# Patient Record
Sex: Female | Born: 1950
Health system: Southern US, Community
[De-identification: ages and names within clinical notes are randomized; demographics above are authoritative.]

## PROBLEM LIST (undated history)

## (undated) DIAGNOSIS — E785 Hyperlipidemia, unspecified: Secondary | ICD-10-CM

## (undated) DIAGNOSIS — I10 Essential (primary) hypertension: Secondary | ICD-10-CM

## (undated) DIAGNOSIS — F1910 Other psychoactive substance abuse, uncomplicated: Secondary | ICD-10-CM

## (undated) DIAGNOSIS — Z720 Tobacco use: Secondary | ICD-10-CM

## (undated) DIAGNOSIS — I739 Peripheral vascular disease, unspecified: Secondary | ICD-10-CM

## (undated) DIAGNOSIS — I639 Cerebral infarction, unspecified: Secondary | ICD-10-CM

## (undated) DIAGNOSIS — D689 Coagulation defect, unspecified: Secondary | ICD-10-CM

## (undated) HISTORY — DX: Essential (primary) hypertension: I10

## (undated) HISTORY — PX: VEIN SURGERY: SHX48

## (undated) HISTORY — DX: Peripheral vascular disease, unspecified: I73.9

## (undated) HISTORY — DX: Coagulation defect, unspecified: D68.9

## (undated) HISTORY — DX: Tobacco use: Z72.0

## (undated) HISTORY — DX: Other psychoactive substance abuse, uncomplicated: F19.10

## (undated) HISTORY — DX: Hyperlipidemia, unspecified: E78.5

## (undated) HISTORY — DX: Cerebral infarction, unspecified: I63.9

---

## 2000-09-19 DIAGNOSIS — I639 Cerebral infarction, unspecified: Secondary | ICD-10-CM

## 2000-09-19 HISTORY — DX: Cerebral infarction, unspecified: I63.9

## 2004-09-19 LAB — HM MAMMOGRAPHY: HM MAMMO: NORMAL (ref 0–4)

## 2004-09-21 ENCOUNTER — Inpatient Hospital Stay (HOSPITAL_COMMUNITY): Admission: EM | Admit: 2004-09-21 | Discharge: 2004-09-27 | Payer: Self-pay | Admitting: Emergency Medicine

## 2004-09-21 ENCOUNTER — Ambulatory Visit: Payer: Self-pay | Admitting: Cardiology

## 2004-09-22 ENCOUNTER — Encounter: Payer: Self-pay | Admitting: Cardiology

## 2004-11-05 ENCOUNTER — Ambulatory Visit: Payer: Self-pay | Admitting: Internal Medicine

## 2004-12-03 ENCOUNTER — Ambulatory Visit: Payer: Self-pay | Admitting: Internal Medicine

## 2005-02-08 ENCOUNTER — Ambulatory Visit: Payer: Self-pay | Admitting: Internal Medicine

## 2005-04-07 ENCOUNTER — Ambulatory Visit: Payer: Self-pay | Admitting: Internal Medicine

## 2007-02-17 ENCOUNTER — Inpatient Hospital Stay (HOSPITAL_COMMUNITY): Admission: EM | Admit: 2007-02-17 | Discharge: 2007-02-20 | Payer: Self-pay | Admitting: Emergency Medicine

## 2007-02-19 ENCOUNTER — Encounter (INDEPENDENT_AMBULATORY_CARE_PROVIDER_SITE_OTHER): Payer: Self-pay | Admitting: Neurology

## 2007-02-19 ENCOUNTER — Ambulatory Visit: Payer: Self-pay | Admitting: Physical Medicine & Rehabilitation

## 2007-02-20 ENCOUNTER — Ambulatory Visit: Payer: Self-pay | Admitting: Physical Medicine & Rehabilitation

## 2007-02-20 ENCOUNTER — Inpatient Hospital Stay (HOSPITAL_COMMUNITY)
Admission: RE | Admit: 2007-02-20 | Discharge: 2007-03-09 | Payer: Self-pay | Admitting: Physical Medicine & Rehabilitation

## 2007-03-15 ENCOUNTER — Ambulatory Visit: Payer: Self-pay | Admitting: Family Medicine

## 2007-03-15 ENCOUNTER — Ambulatory Visit: Payer: Self-pay | Admitting: *Deleted

## 2007-05-14 ENCOUNTER — Ambulatory Visit: Payer: Self-pay | Admitting: Physical Medicine & Rehabilitation

## 2007-05-14 ENCOUNTER — Encounter
Admission: RE | Admit: 2007-05-14 | Discharge: 2007-06-19 | Payer: Self-pay | Admitting: Physical Medicine & Rehabilitation

## 2007-05-18 ENCOUNTER — Ambulatory Visit: Payer: Self-pay | Admitting: Family Medicine

## 2007-05-18 LAB — CONVERTED CEMR LAB
ALT: 33 U/L
AST: 23 U/L
Albumin: 4.6 g/dL
Alkaline Phosphatase: 133 U/L — ABNORMAL HIGH
BUN: 18 mg/dL
Basophils Absolute: 0 K/uL
Basophils Relative: 0 %
CO2: 23 meq/L
Calcium: 9.9 mg/dL
Chloride: 104 meq/L
Cholesterol: 255 mg/dL — ABNORMAL HIGH
Creatinine, Ser: 1.19 mg/dL
Eosinophils Absolute: 0.1 K/uL
Eosinophils Relative: 2 %
Glucose, Bld: 95 mg/dL
HCT: 43.3 %
HDL: 44 mg/dL
Hemoglobin: 13.6 g/dL
LDL Cholesterol: 176 mg/dL — ABNORMAL HIGH
Lymphocytes Relative: 37 %
Lymphs Abs: 2.4 K/uL
MCHC: 31.4 g/dL
MCV: 93.5 fL
Monocytes Absolute: 0.5 K/uL
Monocytes Relative: 8 %
Neutro Abs: 3.4 K/uL
Neutrophils Relative %: 53 %
Platelets: 299 K/uL
Potassium: 4.1 meq/L
RBC: 4.63 M/uL
RDW: 16.2 % — ABNORMAL HIGH
Sodium: 141 meq/L
TSH: 0.5 u[IU]/mL
Total Bilirubin: 0.6 mg/dL
Total CHOL/HDL Ratio: 5.8
Total Protein: 8.1 g/dL
Triglycerides: 177 mg/dL — ABNORMAL HIGH
VLDL: 35 mg/dL
WBC: 6.4 10*3/microliter

## 2007-05-28 ENCOUNTER — Encounter
Admission: RE | Admit: 2007-05-28 | Discharge: 2007-08-26 | Payer: Self-pay | Admitting: Physical Medicine & Rehabilitation

## 2007-07-18 ENCOUNTER — Encounter: Admission: RE | Admit: 2007-07-18 | Discharge: 2007-09-19 | Payer: Self-pay | Admitting: Internal Medicine

## 2007-07-19 ENCOUNTER — Ambulatory Visit: Payer: Self-pay | Admitting: Internal Medicine

## 2007-07-19 ENCOUNTER — Encounter (INDEPENDENT_AMBULATORY_CARE_PROVIDER_SITE_OTHER): Payer: Self-pay | Admitting: Family Medicine

## 2007-07-19 LAB — CONVERTED CEMR LAB
ALT: 23 units/L (ref 0–35)
Cholesterol: 157 mg/dL (ref 0–200)
LDL Cholesterol: 66 mg/dL (ref 0–99)
VLDL: 44 mg/dL — ABNORMAL HIGH (ref 0–40)

## 2007-08-06 ENCOUNTER — Ambulatory Visit: Payer: Self-pay | Admitting: Physical Medicine & Rehabilitation

## 2007-08-06 ENCOUNTER — Encounter
Admission: RE | Admit: 2007-08-06 | Discharge: 2007-09-19 | Payer: Self-pay | Admitting: Physical Medicine & Rehabilitation

## 2007-09-05 ENCOUNTER — Encounter
Admission: RE | Admit: 2007-09-05 | Discharge: 2007-09-19 | Payer: Self-pay | Admitting: Physical Medicine & Rehabilitation

## 2007-09-25 ENCOUNTER — Encounter
Admission: RE | Admit: 2007-09-25 | Discharge: 2007-10-11 | Payer: Self-pay | Admitting: Physical Medicine & Rehabilitation

## 2007-10-29 ENCOUNTER — Encounter
Admission: RE | Admit: 2007-10-29 | Discharge: 2008-01-27 | Payer: Self-pay | Admitting: Physical Medicine & Rehabilitation

## 2007-12-12 ENCOUNTER — Ambulatory Visit: Payer: Self-pay | Admitting: Physical Medicine & Rehabilitation

## 2007-12-19 ENCOUNTER — Ambulatory Visit: Payer: Self-pay | Admitting: Physical Medicine & Rehabilitation

## 2007-12-25 ENCOUNTER — Ambulatory Visit (HOSPITAL_COMMUNITY)
Admission: RE | Admit: 2007-12-25 | Discharge: 2007-12-25 | Payer: Self-pay | Admitting: Physical Medicine & Rehabilitation

## 2007-12-26 ENCOUNTER — Ambulatory Visit: Payer: Self-pay | Admitting: Physical Medicine & Rehabilitation

## 2008-01-09 ENCOUNTER — Ambulatory Visit: Payer: Self-pay | Admitting: Physical Medicine & Rehabilitation

## 2008-03-06 ENCOUNTER — Encounter
Admission: RE | Admit: 2008-03-06 | Discharge: 2008-06-04 | Payer: Self-pay | Admitting: Physical Medicine & Rehabilitation

## 2008-03-10 ENCOUNTER — Ambulatory Visit: Payer: Self-pay | Admitting: Physical Medicine & Rehabilitation

## 2008-03-28 ENCOUNTER — Encounter (HOSPITAL_BASED_OUTPATIENT_CLINIC_OR_DEPARTMENT_OTHER): Admission: RE | Admit: 2008-03-28 | Discharge: 2008-06-12 | Payer: Self-pay | Admitting: Internal Medicine

## 2008-04-07 ENCOUNTER — Ambulatory Visit: Payer: Self-pay | Admitting: Surgery

## 2008-04-09 ENCOUNTER — Ambulatory Visit: Payer: Self-pay | Admitting: Physical Medicine & Rehabilitation

## 2008-04-29 ENCOUNTER — Ambulatory Visit: Payer: Self-pay | Admitting: Surgery

## 2008-04-29 ENCOUNTER — Ambulatory Visit (HOSPITAL_COMMUNITY): Admission: RE | Admit: 2008-04-29 | Discharge: 2008-04-29 | Payer: Self-pay | Admitting: Surgery

## 2008-05-19 ENCOUNTER — Ambulatory Visit: Payer: Self-pay | Admitting: Surgery

## 2008-05-30 ENCOUNTER — Inpatient Hospital Stay (HOSPITAL_COMMUNITY): Admission: RE | Admit: 2008-05-30 | Discharge: 2008-06-09 | Payer: Self-pay | Admitting: Surgery

## 2008-05-30 ENCOUNTER — Ambulatory Visit: Payer: Self-pay | Admitting: Surgery

## 2008-05-31 ENCOUNTER — Encounter: Payer: Self-pay | Admitting: Surgery

## 2008-06-21 ENCOUNTER — Ambulatory Visit: Payer: Self-pay | Admitting: Cardiology

## 2008-06-22 ENCOUNTER — Inpatient Hospital Stay (HOSPITAL_COMMUNITY): Admission: EM | Admit: 2008-06-22 | Discharge: 2008-06-25 | Payer: Self-pay | Admitting: Emergency Medicine

## 2008-06-25 ENCOUNTER — Encounter (INDEPENDENT_AMBULATORY_CARE_PROVIDER_SITE_OTHER): Payer: Self-pay | Admitting: *Deleted

## 2008-07-07 ENCOUNTER — Ambulatory Visit: Payer: Self-pay | Admitting: Surgery

## 2008-07-08 ENCOUNTER — Encounter
Admission: RE | Admit: 2008-07-08 | Discharge: 2008-10-06 | Payer: Self-pay | Admitting: Physical Medicine & Rehabilitation

## 2008-07-09 ENCOUNTER — Ambulatory Visit: Payer: Self-pay | Admitting: Physical Medicine & Rehabilitation

## 2008-07-14 ENCOUNTER — Encounter
Admission: RE | Admit: 2008-07-14 | Discharge: 2008-09-18 | Payer: Self-pay | Admitting: Physical Medicine & Rehabilitation

## 2008-09-05 ENCOUNTER — Ambulatory Visit: Payer: Self-pay | Admitting: Physical Medicine & Rehabilitation

## 2008-10-13 ENCOUNTER — Ambulatory Visit: Payer: Self-pay | Admitting: Surgery

## 2009-09-19 LAB — HM PAP SMEAR: HM PAP: NORMAL

## 2010-05-25 ENCOUNTER — Ambulatory Visit: Payer: Self-pay | Admitting: Cardiovascular Disease

## 2010-10-09 ENCOUNTER — Encounter: Payer: Self-pay | Admitting: Neurology

## 2010-10-10 ENCOUNTER — Encounter: Payer: Self-pay | Admitting: Internal Medicine

## 2010-11-30 ENCOUNTER — Ambulatory Visit (INDEPENDENT_AMBULATORY_CARE_PROVIDER_SITE_OTHER): Payer: Medicare Other | Admitting: Cardiovascular Disease

## 2010-11-30 DIAGNOSIS — I119 Hypertensive heart disease without heart failure: Secondary | ICD-10-CM

## 2010-12-21 ENCOUNTER — Other Ambulatory Visit: Payer: Self-pay | Admitting: Cardiovascular Disease

## 2011-02-01 NOTE — Discharge Summary (Signed)
Wendy Townsend, Wendy Townsend            ACCOUNT NO.:  [id number]   MEDICAL RECORD NO.:  [id number]          PATIENT TYPE:  INP   LOCATION:  5118                         FACILITY:  [facility]   PHYSICIAN:  Reyne Dumas, MD       DATE OF BIRTH:  11-05-1950   DATE OF ADMISSION:  06/21/2008  DATE OF DISCHARGE:  06/25/2008                               DISCHARGE SUMMARY   DISCHARGE DIAGNOSES:  1. Urinary tract infection with Trichomonas.  2. Nausea, vomiting secondary to viral gastroenteritis.  3. Nicotine dependence.  4. Hyperlipidemia.  5. Hypertension.   SUBJECTIVE:  This is a 60 year old female who presents to the ER with a  chief complaint of abdominal pain, nausea, vomiting, which started 4-5  days prior to her presentation.  She was found to be dehydrated in the  ER with elevated creatinine.  Patient denied any sick contacts and was  afebrile.  No obvious cause of the patient's abdominal pain and nausea  were found; therefore, it was thought to be secondary to viral  gastroenteritis.  She had an abdominal KUB done that showed no evidence  of any acute abnormality.  A portable chest x-ray showed no evidence of  acute cardiopulmonary syndrome.  Her blood work was consistent with  mildly elevated white count, hypokalemia, renal insufficiency with a  creatinine of 1.91.  Mildly elevated total bilirubin of 1.3.  The urine  was positive for a large amount of leukocyte esterase and subsequently  grew Trichomonas.  Patient was treated with IV Flagyl.  Patient's  abdominal pain could also be secondary to her urinary tract infection.  Her symptoms continued to improve.  Her oral intake was advanced.  She  denied any nausea, vomiting, or abdominal pain on the day of discharge.  She was found to be hemodynamically stable.   EKG changes with ST depression noted on the inferolateral leads.  Patient did not have any evidence of acute coronary syndrome.  She was  not anticoagulated due to concerns about  diffuse gastritis and Mallory-  Weiss tear.  Serial EKGs remained stable.   Patient had a cardiac echo, the results of which are still pending.   Patient is being ambulated prior to discharge.  If she is deemed to be  stable, then she will go home.   DISCHARGE MEDICATIONS:  1. Aspirin 325 mg p.o. daily.  2. Clonidine 0.1 mg 3 times a day.  3. Hydrochlorothiazide 25 mg once daily.  4. Simvastatin 40 mg p.o. daily.  5. Oxycodone and Tylenol q.4h. as needed.  6. Flagyl 500 mg p.o. q.8h. x7 days.  7. Zofran 4 mg p.o. q.6h. p.r.n. nausea.   DIET:  Cardiac diet with plenty of fluids.   Hold medications, lisinopril, until seen by the PCP.   Follow up with PCP in 5-7 days.      Reyne Dumas, MD  Electronically Signed     NA/MEDQ  D:  06/25/2008  T:  06/25/2008  Job:  [id number]

## 2011-02-01 NOTE — Assessment & Plan Note (Signed)
[facility]   NAME:  Wendy Townsend, Wendy Townsend            ACCOUNT NO.:  [id number]   MEDICAL RECORD NO.:  [id number]      DATE OF BIRTH:  08/30/1951   PHYSICIAN:  Ricard Dillon, M.D.      VISIT DATE:                                   OFFICE VISIT   Ms. Llera is a lady we have been following for a largely ischemic  wound on her left medial ankle.  She has been applying Santyl and  wrapping with a light Kerlix wrap.  She has been followed also by  Vascular Surgery, Dr. Trula Slade and she is status post arteriogram.  Per  the patient, I think she is scheduled now for a FEM-POP bypass on the  left on May 30, 2008.   PHYSICAL EXAMINATION:  Temperature is 98.1.  She looks well.  The wound  dimensions are 4 x 4.1 x 0.9, which is probably unchanged.  However, I  think in this setting, lack of change is actually quite good.  In terms  of the wound bed however, there appears to be healthy granulation with  the rim of epithelialization.  There was nothing that needed debridement  today.  I do not think she requires Santyl any further.  She is having  moderate drainage and some pain; however, the wound actually looks clean  and there is no evidence of surrounding infection.   IMPRESSION:  Ischemic wound, left medial ankle.  I think she is  scheduled for a femoral-popliteal bypass.  I thought we could change the  Silvercel to help with the drainage and reduce any bioburden that might  be present.  Miraculously, the wound continues to be stable to improve.  I think with revascularization, that has a good chance of healing.  I  will see her back one further time next week and then after her  operation.           ______________________________  Ricard Dillon, M.D.     MGR/MEDQ  D:  05/22/2008  T:  05/22/2008  Job:  [id number]

## 2011-02-01 NOTE — Procedures (Signed)
BYPASS GRAFT EVALUATION   INDICATION:  Postoperative evaluation of bypass graft.   HISTORY:  Diabetes:  No.  Cardiac:  No.  Hypertension:  Yes.  Smoking:  Yes.  Previous Surgery:  Left common femoral to popliteal artery bypass on  05/30/2008 by Dr. Trula Slade.   SINGLE LEVEL ARTERIAL EXAM                               RIGHT              LEFT  Brachial:                    109                106  Anterior tibial:             56                 107  Posterior tibial:            60                 88  Peroneal:  Ankle/brachial index:        0.55               0.98   PREVIOUS ABI:  Date: 07/07/08  RIGHT:  0.56  LEFT:  0.90   LOWER EXTREMITY BYPASS GRAFT DUPLEX EXAM:   DUPLEX:  1. Increased velocity of 225 cm/s noted in the left common femoral      artery.  2. Patent left fem-pop bypass graft with no evidence of stenosis.  3. Biphasic duplex waveform noted from inflow artery, within the      graft, and outflow artery.   IMPRESSION:  1. Right ankle brachial index suggests moderate arterial disease with      monophasic Doppler waveforms.  2. Normal left ankle brachial index with biphasic Doppler waveforms.   ___________________________________________  V. [name], MD   AC/MEDQ  D:  10/13/2008  T:  10/13/2008  Job:  [id number]

## 2011-02-01 NOTE — Op Note (Signed)
NAME:  Wendy Townsend, Wendy Townsend            ACCOUNT NO.:  [id number]   MEDICAL RECORD NO.:  [id number]          PATIENT TYPE:  INP   LOCATION:  [id number]                         FACILITY:  [facility]   PHYSICIAN:  Theotis Burrow IV, MDDATE OF BIRTH:  24-Apr-1951   DATE OF PROCEDURE:  05/30/2008  DATE OF DISCHARGE:                               OPERATIVE REPORT   PREOPERATIVE DIAGNOSIS:  Left leg ulcer.   POSTOPERATIVE DIAGNOSIS:  Left leg ulcer.   PROCEDURES PERFORMED:  1. Left common femoral to below-knee popliteal artery bypass graft      with 8-mm ringed probe patent polytetrafluorethylene.  2. Left lower extremity angiogram.   SURGEON:  1. [name], MD   ASSISTANT:  Jacinta Shoe, PA.   ANESTHESIA:  General.   BLOOD LOSS:  150 mL.   COMPLICATIONS:  None.   DRAINS:  None.   CULTURES:  None.   SPECIMENS:  None.   INDICATIONS:  This is a 60 year old female with ulcer on her left foot,  which has been present for approximately 6 months.  I recently saw her  for the first time 2-3 weeks ago.  She has undergone arteriogram, which  reveals a single-vessel runoff.  They elected to proceed with bypass  graft.  She has been vein mapped in her upper and lower extremity and  does not have adequate caliber vein.   PROCEDURE:  The patient was identified in the holding area and taken to  the room where she was placed supine on table.  General endotracheal  anesthesia was administered.  The patient was prepped and draped in  standard sterile fashion.  Time-out was called.  Antibiotics were given.  First a longitudinal incision was made in the groin.  Cautery was used  to dissect the subcutaneous tissue.  The femoral sheath was identified  and opened sharply.  The patient had an occluded proximal superficial  femoral artery.  Her profunda takeoff was just below the level of the  inguinal ligament.  The distal external iliac artery was mobilized under  the inguinal ligament for  proximal control.  The profunda femoral artery  was encircled with vessel loop.  Next, below-knee incision was made two  fingerbreadths below the posterior to tibial edge below the knee.  Cautery was used to dissect the subcutaneous tissue.  Crural fascia was  opened sharply.  Gastrocnemius muscle was then identified and reflected  posteriorly.  The soleus muscle was taken down to the posterior edge of  the tibia for short distance.  Popliteal space was entered.  The  popliteal artery was mobilized down to the anterior tibial branch.  Next, using a long straight tunneler, a tunnel was created deep to the  sartorius muscle.  Once the tunnel was complete, the patient was given  systemic heparinization.  A Henley clamp was used to occlude the distal  external iliac artery.  The vessel loop was tightened on the profunda  femoral artery.  A #11 blade was used to make an arteriotomy.  This was  extended with Potts scissors.  An 8-mm Gore-Tex probe patent ring graft  was  anastomosed to the distal common femoral artery.  This was an end-to-  side anastomosis with 5-0 Prolene.  Prior to completion, the arteries  were flushed appropriately.  The anastomosis was then secured.  Vascular  clamps were released.  There was excellent pulsatile flow through the  graft.  Next, the graft was brought through the previously created  tunnel.  A tourniquet was used for hemostasis for the below-knee  anastomosis.  A Webril was placed in the mid thigh.  Esmarch was used to  exsanguinate the leg.  The tourniquet was taken to 250 mmHg pressure.  Next, a longitudinal arteriotomy was made in the distal popliteal  artery.  This was extended with Potts scissors.  The leg was straight  and the graft was cut to the appropriate length.  The graft was then  beveled to fit the size of arteriotomy.  An end-to-side anastomosis was  created with 6-0 Prolene.  Prior to completion, the tourniquet was  released.  The artery and  the graft were appropriately flushed.  The  anastomosis was then secured.  At this point in time, we elected to  perform arteriogram.  A butterfly needle access was made into the  proximal graft, which was then occluded proximal to the access.  Arteriogram was then confirmed.  This revealed a single-vessel runoff as  documented on the preop arteriogram and a patent anastomosis.  At this  point in point, I gave the patient 25 mg protamine.  Wounds were  copiously irrigated.  The below-knee incision was closed with a 2-0  Vicryl on the fascia as well as the subcutaneous tissue.  The 4-0 Vicryl  was used to close the skin.  The femoral sheath was reapproximated with  2-0 Vicryl.  An additional layer of 2-0 Vicryl followed by 3-0 Vicryl  were used to close the tissue and the skin was closed with 4-0 Vicryl.  Dermabond was placed.  The patient was then successfully extubated and  taken to the recovery room in stable condition.           ______________________________  V. [name], MD  Electronically Signed     VWB/MEDQ  D:  05/30/2008  T:  05/31/2008  Job:  [id number]

## 2011-02-01 NOTE — Discharge Summary (Signed)
Wendy Townsend, Wendy Townsend            ACCOUNT NO.:  [id number]   MEDICAL RECORD NO.:  [id number]          PATIENT TYPE:  INP   LOCATION:  2010                         FACILITY:  [facility]   PHYSICIAN:  Theotis Burrow IV, MDDATE OF BIRTH:  26-Feb-1951   DATE OF ADMISSION:  05/30/2008  DATE OF DISCHARGE:  06/09/2008                               DISCHARGE SUMMARY   ADDENDUM   Addendum to the discharge summary, reference number is [id number].   [name] has had 2 episodes of cardiac decelerations and  approximately 2- to 2-1/2-second pauses.  She was to be discharged this  morning, June 09, 2008.  This occurred prior to the discharge at 5  yesterday afternoon and again approximately 8:30 this morning.  We did  contact Dr. Acie Fredrickson who performed a stress test on her prior to her  surgery.  He made the recommendation to decrease her clonidine to 0.1 mg  p.o. q.i.d. and initiate treatment with amlodipine 5 mg p.o. daily.  He  felt that this was okay to be followed up in the office.  We will have  her make an appointment with him within the next 2 weeks for a followup,  so this problem can be evaluated.  We will discharge her later on today.      Chad Cordial, PA      V. [name], MD  Electronically Signed    KEL/MEDQ  D:  06/09/2008  T:  06/10/2008  Job:  [id number]   cc:   Thayer Headings, M.D.

## 2011-02-01 NOTE — Assessment & Plan Note (Signed)
HISTORY:  Wendy Townsend is back regarding her left ankle wound.  She is doing  wet to dry dressings twice daily.  She had x-rays of her ankle done  yesterday, which were negative for osteomyelitis.  She has finished the  Keflex.  She is still having pain in the leg.  It is keeping her up at  night at 8/10.   REVIEW OF SYSTEMS:  Notable for the above.  A full review is in the  written health and history section in the chart.   PHYSICAL EXAMINATION:  VITAL SIGNS:  Blood pressure 143/78, pulse 92,  respirations 18, saturation 99% on room air.  GENERAL:  The patient is pleasant, alert and oriented x3.  NEUROLOGIC:  She is antalgic on the left leg.  She still has 1/2  sensation on that side and has some dysesthesias in the left arm and  leg.  EXTREMITIES:  The ankle wound is improving with increasing granulation  tissue and less fibronecrotic tissue.  She still has a rim of  fibronecrotic tissue on the anterior portion, essentially from 1 o'clock  to 5 o'clock.  No odor was seen today.  Depth of the wound appeared to  be 1/4 or less.  I measured the overall size as approximately 1.25 to  1.5 inches in diameter.   ASSESSMENT:  1. Right thalamic stroke.  2. Left ankle wound.   PLAN:  1. Continue twice daily dressing changes.  Apparently she will have      someone coming out to help, to check on this.  2. I gave her Vicodin for break-through pain and help her sleep.  3. I will see her back in two weeks for followup.      Meredith Staggers, M.D.  Electronically Signed     ZTS/MedQ  D:  12/26/2007 10:10:54  T:  12/26/2007 10:59:46  Job #:  [id number]

## 2011-02-01 NOTE — Assessment & Plan Note (Signed)
Wendy Townsend is back regarding her right lemic and internal capsule infarcts.  She is now on outpatient therapy.  She gets those through [facility].  She is  very pleased with her progress.  She is having no further pain.  She has  come off Neurontin.  Her blood pressure has been under good control.  She is walking with her walker primarily around the house. She is  beginning trial of ambulation with a cane in therapy.   REVIEW OF SYSTEMS:  Pertinent positives listed above.  Full review is in  the written health and history section in the chart.   SOCIAL HISTORY:  The patient continues to be widowed and live alone.   PHYSICAL EXAMINATION:  VITALS:  Blood pressure is 129/80, pulse is 61,  respiratory rate 16.  She is sating 100% on room air.  EXTREMITIES:  She has improved movement of her left arm and leg today  although they are still a bit apraxic.  Sensation is 1/2 particularly in  the leg.  She is not wearing her AFO but does clear the foot in swing  phase of gait.  Shoulders are negative for rotator cuff signs today.  NEURO:  Cognitively she is very appropriate with good mood.  She showed  good safety judgment today.  HEART:  Regular.  CHEST:  Clear.  ABDOMEN:  Soft, nontender.   ASSESSMENT:  1. Right lemic/internal capsule infarct with left sided weakness,      apraxia and hemicentra loss.  2. History of hypertension.  3. Neuropathic pain.   PLAN:  1. Continue Clonidine, Lisinopril and HCTZ1 for blood pressure      control.  2. Continue therapy to work on gait, stability.  I think she will be      needing her walker foreseeable future.  3. I will see her back in about 3-4 months time.  I asked her to call      me if she has any problems or questions.      Meredith Staggers, M.D.  Electronically Signed     ZTS/MedQ  D:  08/07/2007 12:58:52  T:  08/08/2007 07:41:53  Job #:  [id number]

## 2011-02-01 NOTE — Assessment & Plan Note (Signed)
OFFICE VISIT   MINORI, FLEES  DOB:  [birth year]                                       05/19/2008  CHART#:[id number]   This is a clinic note/history and physical for preop.   CHIEF COMPLAINT:  Left leg ulcer.   HISTORY:  This is a 60 year old female whom I began seeing at the  request of Dr. Ephriam Knuckles for evaluation of left leg ulcer.  The ulcer has  been present for approximately 6 months.  This is associated with pain.  She had been undergoing a chemical debridement with Santyl.  She does  not have any fevers.  She has a recent duplex which shows an ankle  brachial index of 0.38.  I took her for arteriogram and she was found to  have single vessel runoff via her anterior tibial artery with a  superficial femoral artery that was occluded and diseased above the  popliteal artery.  She comes in today for further discussion.   REVIEW OF SYSTEMS:  GENERAL:  Negative fevers or chills.  Negative  weight gain or weight loss.  CARDIAC:  Negative for chest pain.  PULMONARY:  Negative for shortness of breath, asthma, or wheezing.  GASTROINTESTINAL:  Negative.  GU:  Negative.  VASCULAR:  Please see HPI.  NEURO:  She has a history of stroke with left hand weakness which is  improving.  ORTHO:  Negative.  ENT:  Negative.  HEME:  Negative.  PSYCH:  Negative.   PAST MEDICAL HISTORY:  Right thalamic stroke in 2008 of May.  Hypertension, hyperlipidemia, tobacco abuse.   MEDICATIONS:  Clonidine 0.1 mg p.o. t.i.d., aspirin 325 mg daily,  lisinopril 40 mg per day, hydrochlorothiazide 25 mg per day, simvastatin  40 mg per day, Santyl.   ALLERGIES:  Penicillin.  She has tolerated cephalosporins.   PAST SURGICAL HISTORY:  Negative.   FAMILY HISTORY:  Negative for cardiovascular disease.   SOCIAL HISTORY:  She is widowed and disabled.  History of smoking but  quit a year ago.   PHYSICAL EXAMINATION:  Blood pressure is 125/86, pulse is 70.  General:  Well-appearing, no acute distress.  Cardiovascular:  Regular rate and  rhythm.  Pulmonary:  Lungs are clear bilaterally.  Abdomen:  Soft,  nontender.  Neuro:  Grossly intact.  Skin is without rash.  Extremities:  She has a large ulcer on the medial aspect of her left leg.  Pedal  pulses not palpable.   DIAGNOSTIC STUDIES:  Vein mapping was performed.  The patient does not  have vein in her lower extremity which is adequate for bypass.  Angiogram reveals single vessel runoff via her anterior tibial artery.   ASSESSMENT/PLAN:  Left leg ulcer.   PLAN:  Patient has been scheduled for a left femoral to below knee  popliteal artery bypass graft with graft.  This has been scheduled for  Friday, September 11.  Risks and benefits have been discussed with the  patient including bleeding, infection, and patency.  The patient is to  be seen by Dr. Ephriam Knuckles for cardiac evaluation prior to her surgery.   Eldridge Abrahams, MD  Electronically Signed   VWB/MEDQ  D:  05/19/2008  T:  05/20/2008  Job:  [id number]   cc:   Verneita Griffes

## 2011-02-01 NOTE — Consult Note (Signed)
Wendy Townsend, Wendy Townsend               ACCOUNT NO.:  [id number]   MEDICAL RECORD NO.:  [id number]          PATIENT TYPE:  EMS   LOCATION:  MAJO                         FACILITY:  [facility]   PHYSICIAN:  Jill Alexanders, M.D.  DATE OF BIRTH:  [birth year]   DATE OF CONSULTATION:  02/17/2007  DATE OF DISCHARGE:                                 CONSULTATION   HISTORY OF PRESENT ILLNESS.:  Wendy Townsend is an 60 year old, left-  handed, black female, born 12-27-1950, with a history of hypertension  that has been poorly controlled.  This patient has come to the [facility] emergency room after she has noted problems with left-sided  weakness that began some time late on Feb 16, 2007.  The patient went to  bed around 9 p.m. and got up at some point in the evening.  She is not  sure what time it was.  She tried to go to the bathroom but fell, noting  the left side was weak.  The patient was found by her family today, had  left-sided weakness that had gotten a little bit worse since last  evening.  The patient was brought to the emergency room with left  hemiparesis and some slurred speech.  The patient denied headache,  denied numbness on left side.  CT scan of the head shows extensive small-  vessel changes, evidence of an old pontine lacunar infarct.  Neurology  was asked to see the patient for further evaluation.  The patient comes  in as a code stroke.   PAST MEDICAL HISTORY:  1. New onset left hemiparesis with pure motor deficits.  2. Severe hypertension.  3. Cerebrovascular disease with right putaminal stroke in January      2006.  4. Medical noncompliance.  5. History basal ganglia hemorrhage on the right in January 2006.   The patient was supposed to be on labetalol 400 mg twice daily and  Prinivil 20 mg daily, but has not been on any medications prior to  coming in.  The patient was also supposed to be all Foltx one daily.   THE PATIENT HAS ALLERGY TO PENICILLIN.   Smokes about a  pack of cigarettes a week and drinks alcohol on occasion.  In the past, the patient has used marijuana.   SOCIAL HISTORY:  This patient lives in the [place], [place]  area.  Does work in Nurse, adult type work, has no children, is a widow,  lives with a sister and brother.   FAMILY MEDICAL HISTORY:  For the most part unknown.  The patient claims  that both parents are dead, does not know why they passed away.  Father  from past medical records had heart disease and may have had an MI.  Mother died her 57s from old age.  The patient has six sisters, two  brothers, one of brother passed away, cause unknown.   REVIEW OF SYSTEMS:  Notable for no recent fevers, chills.  The patient  denies headache, denies any vision changes, problems with swallowing,  denies neck pain, chest pain, abdominal pain.  The patient  denied any  dizziness, blackout episodes, did fall last evening.  The patient denies  any problems controlling the bowels or the bladder.   PHYSICAL EXAMINATION:  VITAL SIGNS:  Blood pressure initially was  269/133, after 20 of labetalol it has gone down to 222/119, heart rate  85, respiratory rate 16, temperature afebrile.  GENERAL:  This patient is a fairly well-developed black female who is  alert, cooperative at the time of examination.  HEENT:  Head is atraumatic.  Eyes:  Pupils equal, round, reactive to  light.  Disks are flat bilaterally.  NECK:  Supple.  No carotid bruits noted.  RESPIRATORY:  Clear.  CARDIOVASCULAR:  Reveals a regular rate and rhythm.  No obvious murmurs,  rubs noted.  ABDOMEN:  Reveals positive bowel sounds.  No organomegaly or tenderness  noted.  EXTREMITIES:  Without significant edema.  NEUROLOGIC:  Cranial nerves as above.  Very definite lower face droop is  noted.  The patient has good pinprick sensation bilaterally, has good  strength of the facial muscles in the upper face with good eye closure.  The patient has slight dysarthria.  Again,  no aphasia noted.  Motor  testing reveals the patient does have some flexion/extension of the  elbow, does not have good antigravity movement of the left arm, has  minimal if any grip, has barely antigravity movement of the left leg.  The patient has good strength of the right arm, right leg.  Good  pinprick soft touch and vibratory sensation noted.  No evidence of  extinction. The patient has good finger-nose-finger and heel-to-shin on  the right side.  Did not perform well with the left-side due to  weakness.  No ataxia noted.  Patient has fairly symmetric reflexes.  Toes are neutral bilaterally.  The patient scores on the NIH stroke  scale a 7.   LABORATORY VALUES:  Notable for a white count 5.5, hemoglobin 16,  hematocrit 47, MCV of 89.9, platelets of 288.  INR 1.  Sodium 142,  potassium 3.9, chloride of 110, glucose 113, BUN of 10, creatinine 1.  Troponin I of less than 0.05, CK-MB 1.4, myoglobin 157.   Chest x-ray and EKG pending.  CT of the head is as above.   IMPRESSION:  1. New onset subcortical stroke right brain/left body.  2. Malignant hypertension.   This patient once again has presented with severe hypertension.  The  patient has had problems with medical compliance, was to be on several  blood pressure medications including labetalol and Prinivil but stopped  both those medications at some point and has not followed up with  [facility].  The patient comes in with an extremely high blood pressure  and what is probably a lacunar stroke involving the internal capsule or  possibly brain stem.   PLAN:  1. Admission to [facility] step-down unit.  This patient may      require a labetalol drip.  2. MRI of the head.  3. MR angiogram of the intracranial and extracranial vessels.  4. A 2D echocardiogram.  5. Aspirin therapy for now.  6. Physical and occupational therapy evaluation.  7. The patient will have a urine drug screen as well. 8. Will have nursing  bedside follow evaluation protocol.  9. We will follow the patient's clinical course while in-house.      Jill Alexanders, M.D.  Electronically Signed     CKW/MEDQ  D:  02/17/2007  T:  02/17/2007  Job:  [id number]

## 2011-02-01 NOTE — Op Note (Signed)
NAME:  Wendy Townsend, Wendy Townsend            ACCOUNT NO.:  [id number]   MEDICAL RECORD NO.:  [id number]          PATIENT TYPE:  AMB   LOCATION:  SDS                          FACILITY:  [facility]   PHYSICIAN:  VAnnamarie Major IV, MDDATE OF BIRTH:  June 22, 1951   DATE OF PROCEDURE:  04/29/2008  DATE OF DISCHARGE:                               OPERATIVE REPORT   PREPROCEDURE DIAGNOSIS:  Left leg ulcer.   POSTPROCEDURE DIAGNOSIS:  Left leg ulcer.   PROCEDURES PERFORMED:  1. Ultrasound-guided access of left common femoral artery.  2. Abdominal aortogram.  3. Left lower extremity runoff.  4. Second-order catheterization.   PROCEDURE:  The patient was identified in the holding area and taken to  the room where she was placed supine on table.  Bilateral groins were  prepped and draped in a standard sterile fashion.  A time-out was  called.  The right common femoral artery was evaluated with ultrasound  and was found to be widely patent.  Lidocaine 1% was used for local  anesthesia.  The right common femoral artery was accessed under  ultrasound guidance with an 18-gauge needle.  An 0.035 wire was advanced  into the aorta under fluoroscopic visualization.  Next, a 5-French  sheath was placed.  Over the wire, Omni flush catheter was placed at  level L1 and abdominal aortogram was obtained.  Next, catheter was  pulled down the aortic bifurcation, pelvic angiogram was obtained.  Using the Omni flush catheter and a Bentson wire, the aortic bifurcation  was crossed and catheter was placed into the left external iliac artery  and left lower extremity runoff was obtained.   FINDINGS:  Aortogram:  The visualized portions of the suprarenal  abdominal aorta showed minimal disease.  There is single renal arteries  bilaterally, which were widely patent.  The infrarenal abdominal aorta  is patent.  There is a shelf in the distal abdominal aorta, however,  there is no evidence of high-grade stenosis.   Pelvic  angiogram:  The right common iliac artery is patent.  The right  epigastric artery is patent.  There is high-grade stenosis within the  right external iliac artery, which is sheath occlusive.  The left common  iliac artery is widely patent, left external iliac artery is widely  patent, left hypogastric artery is widely patent.   Left Lower Extremity:  The left common femoral artery is widely patent.  The left superficial femoral artery is occluded at its origin.  The left  profunda femoris artery is widely patent.  There is reconstitution of  the superficial femoral artery above the knee, however, the popliteal  artery has high-grade stenosis.  The below-knee popliteal artery is  widely patent.  There is single vessel runoff via the anterior tibial  artery.   After the above images were obtained, decision was made to terminate the  procedure.  Catheters and wires were removed.  The patient was taken to  the holding area for sheath pull.   IMPRESSION:  1. Occluded left superficial femoral artery with reconstitution of the      popliteal artery.  2. Single vessel  runoff.  3. The patient will be scheduled for a femoral to below-knee popliteal      artery bypass.           ______________________________  V. [name], MD  Electronically Signed     VWB/MEDQ  D:  04/29/2008  T:  04/30/2008  Job:  [id number]

## 2011-02-01 NOTE — Discharge Summary (Signed)
NAME:  Wendy Townsend, FAUNCE            ACCOUNT NO.:  [id number]   MEDICAL RECORD NO.:  [id number]         PATIENT TYPE:  CINP   LOCATION:                                 FACILITY:   PHYSICIAN:  Ermalene Searing. Philip Aspen, M.D.DATE OF BIRTH:  Jun 26, 1951   DATE OF ADMISSION:  03/20/2008  DATE OF DISCHARGE:  03/26/2008                               DISCHARGE SUMMARY   CHIEF COMPLAINT:  Here for evaluation of left leg ulcer.   HISTORY OF PRESENT ILLNESS:  The patient is a delightful 60 year old  African American woman who developed a somewhat tender ulcer on the  medial aspect of the left leg proximal to the medial malleolus.  This  has been treated by Dr. Naaman Plummer of physical medicine and rehabilitation,  as she does not have a primary care physician.  He has done x-rays of  the area that reportedly showed no signs of osteomyelitis.  She was  initially treated with Keflex.  He has tried Banker debridement with  Santyl.  The area has not healed with this treatment, however.  She has  some pain in the area that is worse at night.  She has not had recent  fever or chills.   PAST MEDICAL HISTORY:  1. May 2008 she had a right thalamus stroke that led to a      hospitalization and rehabilitation at the [facility] skilled nursing      facility.  2. She also has hypertension.  3. Hyperlipidemia.  4. Ongoing tobacco use of now only one to two cigarettes daily.   HOME MEDICATIONS:  1. Clonidine 0.2 mg p.o. t.i.d.  2. Aspirin 325 mg p.o. daily.  3. Lisinopril 40 mg p.o. daily.  4. Hydrochlorothiazide 25 mg p.o. daily.  5. Simvastatin 40 mg p.o. daily.  6. Santyl applied twice daily to the left leg ulcer.   ALLERGIES:  PENICILLIN has been associated with edema.  She has  tolerated cephalosporins well.   PAST SURGICAL HISTORY:  None.   FAMILY HISTORY:  There are no close relatives who have had diabetes  mellitus, colon cancer, or breast cancer.   SOCIAL HISTORY:  She is a widow, as her husband  died at a young age from  a myocardial infarction.  She has no children.  She has ongoing tobacco  use as described above and no history of alcohol abuse.  She lives with  her sister Willaim Rayas.  She comes from a large family with three brothers and  seven sisters.  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   Slaughters:  She has not had recent fever or chills.  She uses a  walker or cane to ambulate due to mild left lower extremity weakness.  She is generally left-handed and is slowly learning to write and do more  things with her right upper extremity.  She has an occasional dry cough,  but denies shortness of breath or substernal chest pain.  She also  denies dysphagia, constipation, diarrhea, anxiety, or depression.   PHYSICAL EXAMINATION:  VITAL SIGNS:  Blood pressure 129/79, pulse 78,  respirations 16, temperature 98.2, her weight is 159 pounds, height 5  foot 4 inches.  GENERAL:  She is a well-nourished, well-developed [place] American woman  who is in no apparent distress.  NECK:  Supple without jugular venous distention or carotid bruit.  CHEST:  Clear to auscultation.  CARDIOVASCULAR:  Heart had a regular rate and rhythm.  ABDOMEN:  Benign.  EXTREMITIES:  The left anterior tibial pulse is intensity 1 and the left  posterior tibial pulse was nonpalpable.  There is an ulcer on the  anteromedial left leg proximal to the medial malleolus.  This ulcer  measures 3.8 cm x 5.4 cm x 0.5 cm in depth.  There was no significant  eschar.  There was a moderate amount of necrotic slough.  The wound base  had a red appearance and there was some granulation tissue.  There was  no exposed bone, tendon, or muscle and no foul odor.  There was a small  amount of serous drainage.  The periwound integrity was intact.   An attempt was made to do ankle brachial indices via Doppler ultrasound.  On the right the ABI was greater than 0.9.  On the left we were  unable  to perform measurements of the left lower extremity pulses due to  extreme claudication pain with brief occlusion of blood flow via a cuff.  Via Doppler ultrasound the left anterior tibial pulse was obtainable,  however, the posterior tibial pulse was not obtainable.   IMPRESSION:  Left leg ulcer, most likely secondary to arterial  insufficiency.  There was some necrotic debris in the wound.   PLAN:  For now we will continue treatment with Santyl and gauze covered  by a Kerlix to the ulcer with planned wound clinic follow-up in  approximately 1 week.  We have requested a vascular surgeon evaluation  of the blood flow to the left lower extremity as soon as possible due to  the claudication pain experienced in the office.           ______________________________  Ermalene Searing. Philip Aspen, M.D.     DGP/MEDQ  D:  04/01/2008  T:  04/01/2008  Job:  [id number]   cc:   Meredith Staggers, M.D.  Fax: [phone]

## 2011-02-01 NOTE — Assessment & Plan Note (Signed)
OFFICE VISIT   Wendy Townsend, Wendy Townsend  DOB:  1951/01/28                                       04/07/2008  CHART#:[id number]   REASON FOR VISIT:  Left leg ulcer.   HISTORY:  This is a 60 year old female I am seeing for evaluation of a  left leg ulcer.  This has been present for several months.  She has been  treated by Dr. Naaman Plummer, which has been treated with a chemical  debridement with Santyl.  She has undergone x-rays that did not show any  evidence of osteomyelitis.  She does complain of pain in the area, which  is worse at night.  She has not been having any fevers or chills  associated with this.  She has attempted to have a duplex performed,  however was unable to tolerate this due to pain.  The patient has a  history of a stroke which has affected her left hand.  She does walk  with a walker.  She is a history of hypertension as well as  hyperlipidemia as well as tobacco abuse.   REVIEW OF SYSTEMS:  GENERAL:  Negative for fevers or chills.  No weight  loss or weight gain.  CARDIAC:  Negative for chest pain.  PULMONARY:  Negative for shortness of breath or asthma, wheezing.  GI:  Negative.  GU:  Negative.  VASCULAR:  Is as above.  NEURO:  History of a stroke with left hand weakness which is improving.  ORTHO:  Negative.  PSYCH:  Negative.  ENT:  Negative.  HEME:  Negative.   PAST MEDICAL HISTORY:  Right thalamic stroke May 2008.  Hypertension.  Hyperlipidemia.  Tobacco abuse.   MEDICATIONS:  1. Clonidine 0.10 mg p.o. t.i.d.  2. Aspirin 325 per day.  3. Lisinopril 40 mg per day.  4. Hydrochlorothiazide 25 mg per day.  5. Simvastatin 40 mg per day.  6. Santyl.   ALLERGIES:  Penicillin.  She has tolerated cephalosporins.   PAST SURGICAL HISTORY:  Negative.   FAMILY HISTORY:  Negative for cardiovascular disease.   SOCIAL HISTORY:  She is widowed, disabled.  She has a history of smoking  but says she quit a year ago.   PHYSICAL  EXAMINATION:  Blood pressure is 113/71, pulse 78.  General:  She is well-appearing, in no acute distress.  HEENT:  She is  normocephalic, atraumatic.  Pupils are equal.  Sclerae anicteric.  Neck:  Supple, no JVD.  Cardiovascular:  Regular rate and rhythm.  Abdomen:  Soft, nontender.  Neuro:  Cranial nerves II through XII are grossly  intact.  Skin:  Without rash.  Extremities:  Left pedal pulse is  nonpalpable.  She had a large ulcer on her medial malleolus.   DIAGNOSTIC STUDIES:  Venous duplex was performed today which shows no  evidence of DVT as well as no evidence of reflux.  Arterial studies were  also repeated and this reveals an ankle brachial index of 0.38 on the  left and 0.6 on the right, first superficial femoral artery was found to  be occluded by duplex.   ASSESSMENT/PLAN:  Left leg ulcer in the setting of peripheral vascular  disease.   PLAN:  I told the patient that she is at risk for limb loss today.  We  need to get her scheduled for arteriogram.  At the  time of arteriogram I  told her that we could potentially proceed with percutaneous  revascularization.  If we do this she understands that there is a risk  of bleeding as well as embolization, both of which could require  immediate surgeries and potential limb loss if we were unable to  successfully treat the complication.  She also understands that she may  not be a good candidate for percutaneous revascularization and would be  best suited with an operative bypass.  I feel that given the size and  location of this lesion if we do not improve her circulation that she  will end up losing her leg.  Her procedure is going to be scheduled for  Thursday, July 30th.  This will be done at [facility].  Access will be  from the right side with plan of revascularization of the left leg.   Theotis Burrow IV, MD  Electronically Signed   VWB/MEDQ  D:  04/07/2008  T:  04/08/2008  Job:  [id number]   cc:   Ermalene Searing. Philip Aspen, M.D.   Meredith Staggers, M.D.

## 2011-02-01 NOTE — Assessment & Plan Note (Signed)
OFFICE VISIT   Wendy Townsend, Wendy Townsend  DOB:  30-Oct-1950                                       07/07/2008  CHART#:[id number]   REASON FOR VISIT:  Follow-up.   HISTORY:  This is a 60 year old female who initially presented with a  left leg ulcer.  On 05/30/2008 she underwent a left femoral-to-below-  knee popliteal artery bypass graft with PTFE.  She comes back in today  for follow-up.  Her ulcer has significantly decreased in size.  She has  a mild amount of drainage from her left groin.  This is nearly closed, I  cauterized with silver nitrate a small area that has yet to heal.   She underwent arterial duplex which shows an ankle brachial index of  0.90 on the left.   ASSESSMENT/PLAN:  Status post bypass graft for ulcer.   PLAN:  The patient is doing very well at this time.  She should continue  her current wound care.  I will plan on seeing her back in 3 months with  a repeat ultrasound.   Eldridge Abrahams, MD  Electronically Signed   VWB/MEDQ  D:  07/07/2008  T:  07/08/2008  Job:  [id number]   cc:   Ermalene Searing. Philip Aspen, M.D.  Meredith Staggers, M.D.  Verneita Griffes

## 2011-02-01 NOTE — Assessment & Plan Note (Signed)
Wendy Townsend is back regarding her right sided thalamic and internal capsule  strokes.   I saw her last in November.  Her main problem today is left ankle pain.  She states she has had a wound for the last month there.  Essentially  she has left it untreated.  She rates the pain there a 10/10.  She  denies any fever or chills.  She describes the pain overall as aching  and tingling, burning, and intermittent.  Sleep is fair.   REVIEW OF SYSTEMS:  Pertinent positives are listed above.  A full review  is in the written health and history section of the chart.   SOCIAL HISTORY:  The patient's widowed, living alone.   PHYSICAL EXAMINATION:  VITAL SIGNS:  Blood pressure is 128/68, pulse is  71, respiratory rate 18, she is sating 99% on room air.  MUSCULOSKELETAL:  The patient remains apraxic and still has some  problems pertaining to the left foot.  She appears antalgic as well on  the left leg today.  Sensation is 1 out of 2.  Left ankle wound was  examined and essentially the diameter was 2 to 2-1/4 inches.  It had a  covering of fiber necrotic tissue.  We were able to remove some of this.  Underneath, it appeared to be pink and granulating with a covering of  foul-smelling discharge over top.  It did not appear to penetrate deep  to the bone.  The area was tender to touch.  NEUROLOGIC:  Cognitively she is intact.  Safety judgment is fair.  HEART:  Regular.  CHEST:  Clear.  ABDOMEN:  Soft, nontender.   ASSESSMENT:  1. Right thalamic and internal capsule infarct.  2. Hypertension.  3. Neuropathic pain.  4. Left ankle/leg wound.   PLAN:  1. I cleaned the wound and applied a wet-to-dry dressing.  I asked her      to replace the dressing daily.  I will have a nurse come out Monday      to check on the wound.  I asked her to watch for fever or any other      signs of infection.  2. I gave her a 10-day supply of Keflex 250 mg q.i.d.  3. Consider followup x-rays if wound should progress.  4.  I will see her back in a month's time.      Meredith Staggers, M.D.  Electronically Signed     ZTS/MedQ  D:  12/14/2007 12:59:47  T:  12/14/2007 14:10:43  Job #:  [id number]

## 2011-02-01 NOTE — Discharge Summary (Signed)
Wendy Townsend, Wendy Townsend            ACCOUNT NO.:  [id number]   MEDICAL RECORD NO.:  [id number]          PATIENT TYPE:  INP   LOCATION:  2010                         FACILITY:  [facility]   PHYSICIAN:  Theotis Burrow IV, MDDATE OF BIRTH:  [birth year]   DATE OF ADMISSION:  05/30/2008  DATE OF DISCHARGE:  06/09/2008                               DISCHARGE SUMMARY   DISCHARGE DIAGNOSES:  1. Left leg ischemia, nonhealing ulcer.  2. Right thalamic stroke in 2008.  3. Hypertension.  4. Hyperlipidemia.  5. History of tobacco abuse.   PROCEDURES PERFORMED:  Left common femoral to the below-knee popliteal  bypass graft using 8-mm  PTFE Propaten conduit with completion angiogram.   COMPLICATIONS:  None.   DISCHARGE MEDICATIONS:  She is instructed to resume all prehospital  medications consisting of:  1. Hydrochlorothiazide 25 mg p.o. daily.  2. Simvastatin 40 mg p.o. daily.  3. Clonidine 0.2 mg t.i.d.  4. Aspirin 325 mg p.o. daily.  5. Lisinopril 40 mg p.o. daily.  6. Neurontin 300 mg p.o. t.i.d.  7. She is given a prescription for Percocet 5/325 one p.o. q.4 h.      p.r.n. pain, total #30 were given.   CONDITION AT DISCHARGE:  Stable, improving.   DISPOSITION:  She is being discharged home in stable condition with her  wounds healing well.  She is given careful instructions regarding her  care for wounds and her activities.  She is to see Dr. Trula Slade in 2  weeks with ABIs, the office will arrange a visit.   BRIEF IDENTIFYING STATEMENTS:  Complete details, please refer to the  typed history and physical.  Briefly, this 60 year old woman was  evaluated by Dr. Trula Slade for a nonhealing ulcer of her left leg.  She  was found to have left lower extremity ischemia.  Dr. Trula Slade  recommended femoral-to-popliteal bypass grafting.  She was informed of  the risks and benefits of the procedure, and after careful  consideration, elected to proceed with surgery.   HOSPITAL COURSE:  Preoperative  workup was completed as an outpatient.  She was brought in through same-day surgery and underwent the  aforementioned revascularization procedure.  Complete details, please  refer to the typed operative report.  The procedure was without  complications.  She was returned to the postanesthesia care unit  extubated.  She was subsequently transferred to a bed in a Surgical  Stepdown Unit.  She was observed overnight.  The following day, she was  transferred to a bed on a surgical convalescent floor.   Postoperatively, her activity level was increased as tolerated.  She was  mobile.  Her wounds were healing well.  She did have a slow progression  and we did recommend placement in a [facility] for  rehabilitation purposes; however, she refused this.  It was her desire  to be discharged home.  She was stable and was subsequently discharged  home on June 09, 2008, with her wounds healing well.       Chad Cordial, PA      V. [name], MD  Electronically Signed  KEL/MEDQ  D:  06/09/2008  T:  06/09/2008  Job:  [id number]

## 2011-02-01 NOTE — Assessment & Plan Note (Signed)
Wendy Townsend is back regarding her left ankle wound.  We initiated wet-to-dry  dressing last week.  She has been doing this daily.  Wound remains  tender, and she has problems walking on the left leg as a result.  She  had no fevers.  She is taking the Keflex as I have prescribed.  She  rates her pain as an 8 out of 10.   PHYSICAL EXAMINATION:  Blood pressure is 120/70, pulse 80, respiratory  rate is 16.  NEURO EXAM:  Stable.  HEART:  Regular.  CHEST:  Clear.  ABDOMEN:  Soft, nontender.  Cognitively she is stable.  Left ankle is notable for fibronecrotic covering over the anterior two  thirds of the wound.  The wound measures approximately 1-1/2 inches in  diameter.  Underlying the fibronecrotic tissue, there was further  fibrinous material attached to the granulation tissue.  There was no  obvious odor.  Depth of wound was approximately half a centimeter.   ASSESSMENT:  1. Right thalamic stroke.  2. Left ankle wound.   PLAN:  1. We debrided fibronecrotic tissue from the wound today.  The patient      tolerated well.  We exposed a bed of granulation tissue covered      with some fibrinous material.  We will increase her wet-to-dry      dressings to b.i.d. to help remove this fibrinous coating.  She      will stay with Keflex for now for wound prophylaxis.  I sent her      for x-rays of the left ankle to rule out osteomyelitis.  2. I will follow up with her next week.      Meredith Staggers, M.D.  Electronically Signed     ZTS/MedQ  D:  12/19/2007 12:54:04  T:  12/19/2007 14:00:03  Job #:  [id number]

## 2011-02-01 NOTE — Discharge Summary (Signed)
NAMENAMEERA, ABANTO               ACCOUNT NO.:  [id number]   MEDICAL RECORD NO.:  [id number]          PATIENT TYPE:  INP   LOCATION:  3017                         FACILITY:  [facility]   PHYSICIAN:  Burnetta Sabin, N.P.      DATE OF BIRTH:  [birth year]   DATE OF ADMISSION:  02/17/2007  DATE OF DISCHARGE:                               DISCHARGE SUMMARY   DIAGNOSIS AT TIME OF DISCHARGE:  1. Right brain infarct secondary to small-vessel disease.  2. Noncompliant with medicines prior to admission.  3. Hypertension.  4. History of cerebrovascular disease with right putaminal stroke in      January of 2006.  5. History of basal ganglia hemorrhage on the right in January of      2006.  6. Cigarette smoker.  7. Occasional alcohol.  8. Urinary tract infection, new.   MEDICINES AT TIME OF DISCHARGE:  1. Aspirin 325 mg a day.  2. Clonidine 0.2 mg b.i.d.  3. Enoxaparin 40 mg a day.  4. Lisinopril 30 mg a day.  5. Protonix 40 mg a day.  6. Zocor 20 mg a day.  7. Cipro 500 mg p.o. b.i.d. x3 days.   STUDIES PERFORMED:  1. CT of the brain on admission shows no acute abnormality, chronic      microvascular ischemic change, remote-appearing lacunar infarct in      the pons, remote infarct,  right basal ganglia.  2. MRI of the brain shows a linear acute infarct in the lateral aspect      of the right thalamus and posterior limb of the right internal      capsule.  Extensive periventricular and subcortical T2      hyperintensities bilaterally.  3. MRA of the head shows significant irregularity and tapering of the      distal MCA and PCA branches, compatible with intracranial      atherosclerosis.  Additional atherosclerotic disease is seen within      the cavernous siphon bilaterally.  4. MRA of the neck shows question of bilateral ulcerative plaque or      more likely pseudoaneurysms of the internal carotid arteries.      However, this study is significantly degraded by patient motion.  5. 2-D  echocardiogram shows EF of 55% with inability to adequately      evaluate left ventricular regional wall motion.  6. EKG shows normal sinus rhythm, left ventricular hypertrophy with      repolarization abnormality.   LABORATORY STUDIES:  CBC not in the computer.  Chemistry with creatinine  1.0.  Coagulation studies normal.  Cardiac enzymes negative.  Cholesterol 191, triglycerides 145, HDL 32 and LDL 130.  Urinalysis  showed 11-20 white blood cell, 0-2 red blood cells, large leukocyte  esterase.  Homocystine 12.9.  Alcohol less than five.  Hemoglobin A1c  6.1.   HISTORY OF PRESENT ILLNESS:  Wendy Townsend is a 60 year old left-handed  African-American female with a history of hypertension, poorly  controlled.  She comes to [facility] emergency room after noticing acute  left-sided weakness that began some time late on Feb 16, 2007.  She was  to bed around 9:00 p.m., and at some point in the evening got up.  She  was not sure what time it was, but she tried to go to the bathroom and  fell, noting the left side was weak.  The patient was found by her  family the next day with left-sided weakness that had gotten a little  bit worse since the evening prior.  She was brought to the emergency  room with left hemiparesis and slurred speech.  The patient has a  history of old pontine and basal ganglia infarcts which CT showed, but  no acute abnormality.  She was not a TPA candidate secondary to time  greater than 3 hours.  She was admitted to the hospital for further  stroke evaluation.   HOSPITAL COURSE:  MRI was positive for acute infarct in the right  thalamus, right posterior lower limb of the internal capsule.  She was  severely hypertensive on admission, a Cardene drip was initiated, and  she was placed in the unit.  She was quickly weaned and changed to p.o.  medications and moved to the floor.  The patient now has deficits of  left hematemesis requiring inpatient rehabilitation.  She  does have  vascular risk factors of dyslipidemia and hypertension for which she was  not being treated at home.  The patient apparently was to be set up with  [facility] at the time of her last discharge, but this never occurred.  Therefore, she never got her medicines and that she never took them.  Her elevated blood pressure lead to this current stroke.  She again  needs rehabilitation and will go to rehabilitation for continuation of  therapies.  She was placed on aspirin for secondary stroke prevention,  as well as lisinopril and Catapres for her blood pressure.  She was not  able to take a beta-blocker secondary to low heart rate in the 40s to  50s.   CONDITION ON DISCHARGE:  The patient was alert and oriented x3.  Speech  was clear.  No aphasia.  Her extraocular movements are intact.  She has  slight drift on the left and slight left facial weakness.  Left upper  extremity is 0/5, left lower extremity is 1-2/5.  Right side has good  strength.  Her sensation is intact to light touch.  Her heart rate is  regular.  Her breath sounds are clear.   DISCHARGE/PLAN:  1. Transfer to rehabilitation for continuation of PT, OT and speech      therapy.  2. Aspirin for secondary stroke prevention.  3. The patient needs tight risk factor control, especially      hypertension.  4. The patient has a new urinary tract infection and needs 3 days of      treatment.  5. Follow up with Dr. Antony Contras in 2-3 months.  6. Follow up with [facility] 1-2 months after discharge from      rehabilitation.      Burnetta Sabin, N.P.     SB/MEDQ  D:  02/20/2007  T:  02/20/2007  Job:  [id number]   cc:   Pramod P. Leonie Man, MD  [facility]

## 2011-02-01 NOTE — H&P (Signed)
NAME:  Wendy Townsend, Wendy Townsend            ACCOUNT NO.:  [id number]   MEDICAL RECORD NO.:  [id number]          PATIENT TYPE:  INP   LOCATION:  [place]                         FACILITY:  [facility]   PHYSICIAN:  Donna Christen B. Joelyn Oms, MD DATE OF BIRTH:  06/25/1951   DATE OF ADMISSION:  06/21/2008  DATE OF DISCHARGE:                              HISTORY & PHYSICAL   PRIMARY CARE PHYSICIAN:  None.   CHIEF COMPLAINT:  Nausea and vomiting.   HISTORY OF PRESENT ILLNESS:  This is a 60 year old lady with a chief  complaint of abdominal pain associated with nausea and vomiting.  This  started approximately Wednesday of last week when she got up in the  morning and noted that she had some abdominal discomfort, went to take  her medications and shortly after taking her medications experienced  emesis.  She has since been unable to keep anything down including her  medications.  There are no sick contacts in the household.  She also  states that she has had chills, but no fever and she has also not had  diarrhea associated with nausea and vomiting.  She describes the pain as  constant and around her navel, however, it is only slightly worse with  palpation.  She states that she does have some chest pain when she has  her emesis and when she moves in certain ways, but does not have it at  other time.  She does not complain of shortness of breath, arm pain, or  neck pain.  We were asked to admit her secondary to some new ST-segment  depressions in the setting of this abdominal pain with nausea and  vomiting and her first set of cardiac enzymes are negative.   PAST MEDICAL HISTORY:  1. Recent fem-pop.  She now has fem-pop on both left and right.  The      last one was done on the left on May 30, 2008.  2. Hyperlipidemia.  3. Hypertension.  4. CVA in May 2008 to include right thalamic and right internal      capsule infarcts.   SOCIAL HISTORY:  She does use tobacco.  No alcohol.  She is disabled.  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  She currently lives with her sister.   FAMILY HISTORY:  Mom and dad both with hypertension.  No other  significant disease.  She has siblings that are healthy.   REVIEW OF SYSTEMS:  Negative x10 except for that stated in the HPI.   ALLERGIES:  PENICILLIN.   MEDICATIONS:  1. Aspirin 325 mg daily.  2. Clonidine 0.1 mg t.i.d. p.o.  3. HCTZ 25 mg p.o. daily.  4. Simvastatin 40 mg p.o. daily.  5. Percocet 1 tablet p.r.n.  6. Lisinopril 40 mg daily.   PHYSICAL EXAMINATION:  VITAL SIGNS:  Heart rate 103, blood pressure  124/94, respirations are 18, and temperature is 98.3.  HEENT:  Tacky mucous membranes.  NECK:  Supple without lymphadenopathy or masses.  JVP is normal.  CHEST:  Mild diffuse wheezes bilaterally, but is otherwise clear.  HEART:  Regular rate and rhythm.  There is a  grade 2 systolic ejection  murmur.  No other rubs or gallops.  ABDOMEN:  Soft, hyperactive bowel sounds, nondistended, mildly tender to  deep palpation diffusely.  No focal area of tenderness.  No rebound or  guarding.  EXTREMITIES:  No clubbing, cyanosis, or edema.  A +1 dorsalis pedis and  posterior tibial pulses.  She does have a small ulcer on her left groin  from her previous fem-pop surgery.   ECG reveals a sinus tachycardia with some 1-mm ST depressions in leads  II, III, aVF, and V4 through V6.  These were not noted on the previous  ECG.   Chest x-ray is otherwise unremarkable.   LABORATORY DATA:  Cardiac enzymes are negative.  White count is 12.6  with a hemoglobin of 12.4, hematocrit 36.7, and platelets are 296.  Glucose is normal.  Urinalysis is negative for nitrite, however, large  for leukocyte esterase.  There were numerous white blood cells with few  bacteria and Trichomonas was noted.  This was a contaminated sample as  there were many urine epithelial cells.   ASSESSMENT/PLAN:  1. Abdominal pain.  This has been going on since last Wednesday.  This      has been  chronic and unrelenting in nature associated with nausea      and vomiting, but no diarrhea.  At this time, she is doing better      with IV fluids and antiemetics.  We will continue these, however,      we would like to further workup with a KUB.  We would also like to      treat her urine as a possible cause of her abdominal pain with      nausea and vomiting.  2. ECG changes.  Although, she does have some ST-segment depressions      in her inferolateral leads.  She has had nausea and vomiting that      has been steady for days on end.  If this would have been an      anginal equivalent, her enzymes should be mopped secondary to a      prolonged time of ischemia, however, her enzymes were normal.      There is a low likelihood for acute coronary syndrome, so we will      not initiate heparin as she could have worsening bleeding      complications from diffuse gastritis or Mallory-Weiss tear, etc.      We will otherwise treat conservatively.  3. Urine with Trichomonas.  We will treat with appropriate antibiotic      therapy.  4. Tobacco abuse.  We have counseled her on smoking cessation for      approximately 5 minutes.      Jobe Gibbon Joelyn Oms, MD  Electronically Signed     GBS/MEDQ  D:  06/22/2008  T:  06/22/2008  Job:  [id number]

## 2011-02-01 NOTE — Procedures (Signed)
VASCULAR LAB EXAM   INDICATION:  Evaluation prior to left lower extremity revascularization.  The patient has a known right external iliac artery stenosis and an  occluded left superficial femoral artery.   HISTORY:  Diabetes:  No.  Cardiac:  No.  Hypertension:  Yes.   EXAM:  Bilateral greater saphenous vein mapping.   IMPRESSION:  1. Right greater saphenous vein was identified and measured.  Diameter      measurements ranged from 0.19 cm to 0.43 cm (see attached page).  2. Left greater saphenous vein was identified.  The left greater      saphenous vein divides into multiple small branches in the proximal      thigh, making it unsuitable for use as a bypass graft conduit.   ___________________________________________  V. [name], MD   MC/MEDQ  D:  05/19/2008  T:  05/20/2008  Job:  [id number]

## 2011-02-01 NOTE — Assessment & Plan Note (Signed)
[facility]   NAME:  Wendy Townsend, Wendy Townsend            ACCOUNT NO.:  [id number]   MEDICAL RECORD NO.:  [id number]      DATE OF BIRTH:  October 10, 1950   PHYSICIAN:  Ricard Dillon, M.D.      VISIT DATE:                                   OFFICE VISIT   Wendy Townsend was a lady who was seen here by Dr. Philip Aspen for her  initial visit on April 01, 2008.  She was felt to have mostly arterial  insufficiency wound on her left leg just above her medial malleolus.  There was necrotic debris in the wound.  She tolerated both ABIs and the  debridement very poorly.  Since then, she has been to see Dr. Trula Slade of  vascular surgery.  He felt that the leg ulcer represented critical  ischemia.  She was initially booked for an urgent arteriogram this week,  although as I understand that this has now been postponed to April 29, 2008.  The arterial studies done in the office showed an ankle brachial  index of 0.38 on the left and 0.6 on the right.   The patient complains of less pain.  She has been to the pain center and  had adjustments in her medications.  She has been using Santyl at home  with a gauze and a Kerlix wrap.   On examination, temperature is 98.2, pulse 78, and blood pressure  150/87.  The area on the left medial ankle measures 4 x 5.3 x 1.  The  depth of this wound is deteriorated somewhat.  There is still a mild  amount of slough on the surface of this wound.  I did not debride this  today preferring to continue with the Santyl.  However, there is healthy-  looking granulation.  Overall, I think the wound is unchanged but  probably has less adherent slough.   IMPRESSION:  Arterial insufficiency wound.  I am going to continue the  Santyl for now being done at home by the patient.   I have continued with the Santyl being dressed at home by the patient.  Once the arteriogram is done with hopefully minimally invasive  interventions, then we will follow the patient again  to see if we can be  more aggressive with wound healing.           ______________________________  Ricard Dillon, M.D.     MGR/MEDQ  D:  04/16/2008  T:  04/17/2008  Job:  [id number]

## 2011-02-01 NOTE — Assessment & Plan Note (Signed)
Wendy Townsend is back regarding her right thalamic stroke and left lower  extremity pain.  Wound is healed and her pain has gone down  dramatically.  She is not using any more Vicodin or Neurontin at this  point.  She is walking with her brace and still involved in physical  therapy.  She uses a walker for support longer distances, but often  walks without adaptive device at home.   Her pain overall is 2/10.  She is sleeping well.  Her mood is improved  overall.   REVIEW OF SYSTEMS:  Notable for the above.  Full review is in the  written health and history section of the chart.   SOCIAL HISTORY:  The patient is widowed and living alone.   PHYSICAL EXAMINATION:  VITAL SIGNS:  Blood pressure is 127/70, pulse 71,  respiratory rate 18, she is satting 98% on room air.  GENERAL:  The patient is pleasant, alert and oriented x3.  Wound is  healed with eschar formation.  Strength is 3+ to 4/5 in left upper and  lower extremities with sensations to 1/2.  She wears her left-hinged  double upright AFO for support and tends to shift weight predominantly  to the right, but is stable even without her walker today.  CRANIAL NERVE:  Notable for mild central 7 to the left.  HEART:  Regular rate.  CHEST:  Clear.  ABDOMEN:  Soft and nontender.   ASSESSMENT:  1. 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  2. Left ankle wound, now healed.   PLAN:  1. Continue with walker when going longer distances.  I really would      recommend it except if she is in the house going short distances.      The double upright AFO seems to have worked nicely for her overall.  2. I will see her back in about 6 months.  I am extremely pleased with      her progress.      Meredith Staggers, M.D.  Electronically Signed     ZTS/MedQ  D:  09/05/2008 13:11:07  T:  09/06/2008 05:26:17  Job #:  [id number]   cc:   Dr. Calton Golds

## 2011-02-01 NOTE — Assessment & Plan Note (Signed)
HISTORY:  Wendy Townsend is back her left ankle and right thalamic stroke.  She  had been over to the [facility], and they have initiated treatment  following her.  Their care is much appreciated.  We have also sent her  to Dr. Trula Slade who is recommending arteriogram, which I think is  entirely appropriate as well.  The patient rates her pain a 5/10 on  average.  She states that Neurontin is helping with her pain somewhat.  She describes pain as sharp and burning.  Pain interferes with general  activity, in relations with others, and enjoyment of life on a moderate  level.  The patient walks with her walker.  She still needs some help  with dressing and household tasks.   REVIEW OF SYSTEMS:  Notable for trouble walking.  Other pertinent  positives are as above and full review is in the written health and  history section of the chart.   SOCIAL HISTORY:  Essentially is unchanged.   PHYSICAL EXAMINATION:  VITAL SIGNS:  Blood pressure is 143/64, pulse is  86, respiratory rate is 20, and she is sating 98% on room air.  GENERAL:  The patient is pleasant, alert, and oriented x3.  She walks  with a limp and antalgic towards her left, but seems to be putting a  little more weight on the left leg today.  She continues to have  decreased pinprick over the left leg, foot, and arm.  The left leg is  more tender than the arm.  Dressings for wound was not opened today.  No  odor was seen nor drainage with the dressing.  Cognitively, she was  intact with a good insight and awareness.  Strength was 4-4+/5 left arm  and leg today.  NEUROLOGIC:  Cranial nerve exam was nonfocal.   ASSESSMENT:  1. 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  2. Left ankle wound.   PLAN:  1. Again, I appreciated the care of the [facility] at      [facility].  2. Await results of arteriogram.  3. Increase Neurontin to 200 mg t.i.d.  She may need to go to 300 mg      t.i.d. dose.  4. I encouraged  angulation with walker as possible.      Meredith Staggers, M.D.  Electronically Signed     ZTS/MedQ  D:  04/09/2008 12:25:28  T:  04/10/2008 04:57:47  Job #:  [id number]   cc:   Chrystine Oiler

## 2011-02-01 NOTE — Assessment & Plan Note (Signed)
Wendy Townsend is back regarding her left ankle wound.  She was having some  discomfort there.  She is on b.i.d. wet to dry dressing.  X-rays of her  ankle were negative for osteomyelitis.  She has orders for home health  nurse, but the nurse has not seen her as of yet.  She tried the  hydrocodone but stated that it made her pain worse so she stopped it.  She states sleep is fair at best.  No other changes are noted today.  The patient rates her pain as a 9/10 on average.   REVIEW OF SYSTEMS:  Notable for the above.  Full review has been written  in the health history section of the chart.   PHYSICAL EXAMINATION:  VITAL SIGNS:  Blood pressure 135/82, pulse 85,  respiratory rate 18, satting 100% on room air.  GENERAL:  The patient is pleasant, alert and oriented x3.  Affect is  bright and appropriate.  She still is antalgic to the left and has decreased sensation at 1 out  of 2 over the left leg.  Left upper extremity exam stable.  She uses a  cane for support.  Left ankle wound is notable for some general  improvement in the depth of the wound, although the diameter appears to  still be about one and a half inches.  She had an anterior rim of fiber  necrotic tissue, that has not changed drastically.  Wound appeared clean  and she had appropriate dressing over it.   ASSESSMENT:  1. Right thalamic stroke.  2. Left ankle wound.   PLAN:  1. Will add collagenase to help enzymatically debride the wound      further.  She will apply this b.i.d. with her damp dressing.  She      was given instructions today.  Hopefully her nurse will help her      with this as well.  2. Will add Neurontin back to her regimen 100 mg t.i.d., to help with      left leg pain, as I think a lot of the pain is related to her      thalamic stroke.  3. I will see her back in about 2 weeks' time.      Meredith Staggers, M.D.  Electronically Signed     ZTS/MedQ  D:  01/09/2008 10:07:24  T:  01/09/2008 10:50:47   Job #:  [id number]

## 2011-02-01 NOTE — Assessment & Plan Note (Signed)
[facility]   NAME:  Wendy Townsend, Wendy Townsend            ACCOUNT NO.:  [id number]   MEDICAL RECORD NO.:  [id number]      DATE OF BIRTH:  Oct 14, 1950   PHYSICIAN:  Ricard Dillon, M.D.      VISIT DATE:                                   OFFICE VISIT   [name] returns today in followup for her arterial insufficiency  ulcer of her left medial ankle.  The most recently we applied silver  alginate due to drainage and wrapping her in a light Kerlix wrap.  She  has had an arteriogram and she is to be admitted to hospital tomorrow  for her fem-pop bypass.   On examination, her temperature is 98.2.  She looks systemically well.  The wound dimensions are 3.6 x 4.3 x 0.3.  Overall, I think this wound  is unchanged.  However, I am very optimistic with revascularization,  will be able to get some healing here.  The wound was covered with a  light eschar.  I did not debride this pending her surgery tomorrow.  We  have reapplied silver alginate and wrapped her in light Kerlix.  She is  to follow up with [place] postop.   IMPRESSION:  Arterial insufficiency wound, left medial ankle.  Her fem-  pop bypass is tomorrow.  Postoperatively, I think this will improve.  All the options will be opened to her, then up to including Apligraf  even hyperbaric oxygen if this does not improve.           ______________________________  Ricard Dillon, M.D.     MGR/MEDQ  D:  05/29/2008  T:  05/29/2008  Job:  [id number]

## 2011-02-01 NOTE — Assessment & Plan Note (Signed)
[facility]   NAME:  Wendy Townsend, Wendy Townsend            ACCOUNT NO.:  [id number]   MEDICAL RECORD NO.:  [id number]      DATE OF BIRTH:  [birth year]   PHYSICIAN:  Ricard Dillon, M.D. VISIT DATE:  05/07/2008                                   OFFICE VISIT   [name] is a lady we have been following for a largely ischemic  wound on her left medial ankle.  She has been applying Santyl and a  light Kerlix wrap.  She had gone to see Dr. Trula Slade on April 29, 2008.  She had an arteriogram.  She has an occluded left superficial artery.  There is also high-grade stenosis of the popliteal artery.  He comments  on the patient being scheduled for a femoral to below-knee popliteal  artery bypass.   PHYSICAL EXAMINATION:  Temperature is 98.5, pulse 104, and blood  pressure 126/84.  The wound measures 4 x 4.1 x 1.1.  This would  represent some improvement in overall volume.  Once again, the wound was  anesthetized with EMLA.  We did a light nonexcisional debridement  removing some tan adherent slough, and the wound bed looked healthy  after this with reasonable granulation.  There is no evidence of  infection.   IMPRESSION:  Ischemic wound, left medial ankle.  The patient is due to  be followed by Vascular Surgery next week.  I think the goal here would  be to do as much as we can for this wound pending revascularization.  We  have continued the Santyl and Kerlix wrap.  I think the general amount  of slough I am debriding improves every time I see this.  I hope to be  able to change to another dressing next week.           ______________________________  Ricard Dillon, M.D.     MGR/MEDQ  D:  05/07/2008  T:  05/07/2008  Job:  [id number]

## 2011-02-01 NOTE — Assessment & Plan Note (Signed)
HISTORY:  Wendy Townsend is back regarding her left ankle wound.  She was  supposed to see me in 2 weeks after last visit, but there was a  scheduling mishap and she has not been back for 2 months.  She has been  applying the collagenase twice a day.  She states that she has not seen  a lot of change.  She rates the pain is still there, 8/10.  She denies  any fevers or change in drainage.   REVIEW OF SYSTEMS:  Notable for the above with ongoing issues with  walking and pain.   SOCIAL HISTORY:  Unchanged.   PHYSICAL EXAMINATION:  VITAL SIGNS:  Blood pressure is 129/76, pulse 77,  respiratory rate 18, and she is sating 100% on room air.  GENERAL:  The patient is pleasant, alert, and oriented x3.  Affect is  quite appropriate. She still remains antalgic to the left with decreased  sensation over the leg and foot.  She uses the cane for support.  Left  ankle really has not changed in size with the diameter still about 1-1/2  inches.  She has a fine coating of fibrinous material over the  granulation tissue.  She had thickly caked amounts of collagenase over  the wound as well.  No foul odor or purulent drainage was appreciated  today.   ASSESSMENT:  1. Right thalamic stroke.  2. Left ankle wound.   PLAN:  1. We will see if we can have the patient referred to the [place] at [facility].  She also may benefit      from pulse lavage therapy, although I do not think they provide      this treatment there.  2. Asked her to use rest of the collagenase, to gently scrub the wound      with distilled water and gauze prior to applying a thin coat of      collagenase to the wound itself.  She can continue this twice a      day.  3. Continue Neurontin for neuropathic left leg pain.  4. I will see her back in about 1 month's time.  I may see her back      sooner if we are not able to get her into the [facility].      Meredith Staggers,  M.D.  Electronically Signed     ZTS/MedQ  D:  03/10/2008 11:19:37  T:  03/11/2008 01:46:15  Job #:  [id number]

## 2011-02-01 NOTE — Assessment & Plan Note (Signed)
OFFICE VISIT   HAJRA, CROKE  DOB:  07/13/1951                                       10/13/2008  CHART#:[id number]   REASON FOR VISIT:  Followup.   HISTORY:  This is a 60 year old female who I initially saw for a left  leg ulcer.  She underwent left femoral to below-knee popliteal artery  bypass graft with Gore-Tex on May 30, 2008.  She comes back in  today for followup.  She has no complaints at this time.  She is happy  with the progress that the ulcer has made.  It has nearly disappeared.  She has been discharged from the wound center.  She has no complaints of  pain or swelling.   PHYSICAL EXAMINATION:  Her blood pressure is not recorded today.  She is well-appearing, in no acute distress.  CARDIOVASCULAR:  Regular rate and rhythm.  Her left leg ulcer is nearly resolved.  Her incisions are all well-  healed.   DIAGNOSTIC STUDIES:  The patient had a duplex today which a reveals  widely patent bypass graft.  Velocities are lower throughout the graft;  however, I think this is due to size mismatch.   ASSESSMENT/PLAN:  Status post left leg bypass.  The patient is doing  very well at this time.  I am placing on her on our ultrasound bypass  graft protocol.  I will follow up with her on a p.r.n. basis.   Eldridge Abrahams, MD  Electronically Signed   VWB/MEDQ  D:  10/13/2008  T:  10/14/2008  Job:  [id number]   cc:   Ermalene Searing. Philip Aspen, M.D.  Meredith Staggers, M.D.  Verneita Griffes

## 2011-02-01 NOTE — Assessment & Plan Note (Signed)
Wendy Townsend is back regarding her right thalamic and internal capsule infarcts.  She has been home for the last two months with her family.  She is  moving around independently at home.  She still needs some assistance  with dressing, bathing, toileting, meal prep, household activities,  etcetera.  She has minimal pain occasionally in the left shoulder at  night time; but generally is doing better there.  She sleeps fairly  well.  She has questions about medications today.  She ran out of her  lisinopril recently and needs a refill.  She states that her blood  pressure has been under fairly good control.  She denies any problems  with her mood.   REVIEW OF SYSTEMS:  A full review  is in the written health history  section of the chart.  She does notably report coughing, but this has  really been minor.   SOCIAL HISTORY:  Without change.  She lives apparently in a 1-level  house with 4-steps to enter.  She was working as a Retail buyer prior to  admission.   PHYSICAL EXAM:  Blood pressure 112/65, pulse 65, respiratory rate is 18.  She is saturating 98% on room air.  The patient is pleasant, alert, and oriented x3.  Affect is generally  bright and appropriate.  She walks with her walker today, and does quite  well.  She uses a left hand splint for coordination.  She does have  active movement in the left hand at 2+ to 3/5 with significant apraxia  and proprioception difficulties.  Left leg is similar with decreased  proprioception, although she has a bit better fluidity with this.  She  is walking without her AFO as she felt that it was too large and tended  to cut into her feet while wearing the shoe.  Shoulders were negative  for rotator cuff signs today.  Cognitively, she was generally  appropriate.  Mood was pleasant.  I saw no signs of depression, today.  HEART:  Regular.  CHEST:  Clear.  ABDOMEN: Soft, nontender.  Cranial nerve exam was generally intact, today.   ASSESSMENT:  1. Right  thalamic/internal capsular infarct.  2. History of hypertension.  3. Neuropathic pain.   PLAN:  1. Taper off Neurontin.  2. Continue clonidine and lisinopril for blood pressure control, as      well as HCTZ.  I did give her a refill for lisinopril today.  3. I would like to get outpatient therapy to work on her apraxia and      fluidity of gait.  She will look into her transportation options.      She may need to look at [facility] potentially.  The patient will follow      up with neurology is recommended.  4. I will see her back here in about 2-3 months' time.      Meredith Staggers, M.D.  Electronically Signed     ZTS/MedQ  D:  05/15/2007 13:15:09  T:  05/16/2007 08:11:17  Job #:  [id number]

## 2011-02-01 NOTE — Discharge Summary (Signed)
NAME:  Wendy Townsend, Wendy Townsend            ACCOUNT NO.:  [id number]   MEDICAL RECORD NO.:  [id number]         PATIENT TYPE:  HREC   LOCATION:                                 FACILITY:   PHYSICIAN:  Ermalene Searing. Philip Aspen, M.D.DATE OF BIRTH:  11-08-1950   DATE OF ADMISSION:  DATE OF DISCHARGE:                               DISCHARGE SUMMARY   CHIEF COMPLAINT:  Here for evaluation of left leg ulcer.   HISTORY OF PRESENT ILLNESS:  The patient is a delightful 60 year old  African American woman who developed a somewhat tender ulcer on the  medial aspect of the left leg proximal to the medial malleolus.  This  has been treated by Dr. Naaman Plummer of physical medicine and rehabilitation,  as she does not have a primary care physician.  He has done x-rays of  the area that reportedly showed no signs of osteomyelitis.  She was  initially treated with Keflex.  He has tried Banker debridement with  Santyl.  The area has not healed with this treatment, however.  She has  some pain in the area that is worse at night.  She has not had recent  fever or chills.   PAST MEDICAL HISTORY:  1. May 2008 she had a right thalamus stroke that led to a      hospitalization and rehabilitation at the [facility] skilled nursing      facility.  2. She also has hypertension.  3. Hyperlipidemia.  4. Ongoing tobacco use of now only one to two cigarettes daily.   HOME MEDICATIONS:  1. Clonidine 0.2 mg p.o. t.i.d.  2. Aspirin 325 mg p.o. daily.  3. Lisinopril 40 mg p.o. daily.  4. Hydrochlorothiazide 25 mg p.o. daily.  5. Simvastatin 40 mg p.o. daily.  6. Santyl applied twice daily to the left leg ulcer.   ALLERGIES:  PENICILLIN has been associated with edema.  She has  tolerated cephalosporins well.   PAST SURGICAL HISTORY:  None.   FAMILY HISTORY:  There are no close relatives who have had diabetes  mellitus, colon cancer, or breast cancer.   SOCIAL HISTORY:  She is a widow, as her husband died at a young age from  a myocardial infarction.  She has no children.  She has ongoing tobacco  use as described above and no history of alcohol abuse.  She lives with  her sister Willaim Rayas.  She comes from a large family with three brothers and  seven sisters.  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   [place]:  She has not had recent fever or chills.  She uses a  walker or cane to ambulate due to mild left lower extremity weakness.  She is generally left-handed and is slowly learning to write and do more  things with her right upper extremity.  She has an occasional dry cough,  but denies shortness of breath or substernal chest pain.  She also  denies dysphagia, constipation, diarrhea, anxiety, or depression.   PHYSICAL EXAMINATION:  VITAL SIGNS:  Blood pressure 129/79, pulse 78,  respirations 16, temperature 98.2,  her weight is 159 pounds, height 5  foot 4 inches.  GENERAL:  She is a well-nourished, well-developed [place] American woman  who is in no apparent distress.  NECK:  Supple without jugular venous distention or carotid bruit.  CHEST:  Clear to auscultation.  CARDIOVASCULAR:  Heart had a regular rate and rhythm.  ABDOMEN:  Benign.  EXTREMITIES:  The left anterior tibial pulse is intensity 1 and the left  posterior tibial pulse was nonpalpable.  There is an ulcer on the  anteromedial left leg proximal to the medial malleolus.  This ulcer  measures 3.8 cm x 5.4 cm x 0.5 cm in depth.  There was no significant  eschar.  There was a moderate amount of necrotic slough.  The wound base  had a red appearance and there was some granulation tissue.  There was  no exposed bone, tendon, or muscle and no foul odor.  There was a small  amount of serous drainage.  The periwound integrity was intact.   An attempt was made to do ankle brachial indices via Doppler ultrasound.  On the right the ABI was greater than 0.9.  On the left we were unable  to perform  measurements of the left lower extremity pulses due to  extreme claudication pain with brief occlusion of blood flow via a cuff.  Via Doppler ultrasound the left anterior tibial pulse was obtainable,  however, the posterior tibial pulse was not obtainable.   IMPRESSION:  Left leg ulcer, most likely secondary to arterial  insufficiency.  There was some necrotic debris in the wound.   PLAN:  For now we will continue treatment with Santyl and gauze covered  by a Kerlix to the ulcer with planned wound clinic follow-up in  approximately 1 week.  We have requested a vascular surgeon evaluation  of the blood flow to the left lower extremity as soon as possible due to  the claudication pain experienced in the office.           ______________________________  Ermalene Searing. Philip Aspen, M.D.     DGP/MEDQ  D:  04/01/2008  T:  04/01/2008  Job:  [id number]   cc:   Meredith Staggers, M.D.  Fax: [phone]

## 2011-02-01 NOTE — H&P (Signed)
NAMEMARILYN, [name]               ACCOUNT NO.:  [id number]   MEDICAL RECORD NO.:  [id number]          PATIENT TYPE:  IPS   LOCATION:  NA                           FACILITY:  [facility]   PHYSICIAN:  Meredith Staggers, M.D.DATE OF BIRTH:  09-06-1951   DATE OF ADMISSION:  DATE OF DISCHARGE:                              HISTORY & PHYSICAL   INITIAL OFFICE NOTE/HISTORY AND PHYSICAL:   CHIEF COMPLAINTS:  Left-sided weakness.   HISTORY OF PRESENT ILLNESS:  This is a 60 year old African-American  female with a history of basal ganglia hemorrhage in January of 2006  with poorly controlled hypertension who was admitted on Feb 17, 2007  with left-sided weakness and slurred speech.  MRI of the brain  ultimately revealed acute infarctions in the right thalamus and right  posterior limb of the internal capsule with extensive small vessel  disease and intracranial atherosclerosis.  Question of ulcerative  plaques or pseudoaneurysms of the bilateral ICAs, as well.  Echocardiogram was unremarkable with increase in left ventricular wall  thickness and mild tricuspid valve regurgitation.  The patient continues  to have deficits with immobility and self care, and thus was brought to  the inpatient rehab unit today.   REVIEW OF SYSTEMS:  Notable for poor appetite and headaches.  She is  sleeping fairly well.  Other pertinent positives listed above and full  review is in the written H&P.   PAST MEDICAL HISTORY:  1. Positive for right basal ganglia hemorrhage in 2006.  2. Hypertension.   SOCIAL HISTORY:  The patient smokes 1-2 cigarettes a day.  She drinks  alcohol occasionally.   FAMILY HISTORY:  Positive for questionable coronary artery disease.   SOCIAL HISTORY:  The patient lives with her family in a one-level house  with 4 steps to enter.  She does janitorial work.  She was independent  prior to arrival.   ALLERGIES:  PENICILLIN which causes swelling.   HOSPITAL COURSE:  1. Labetalol 400  mg b.i.d.  2. Prinivil 20 mg daily.  3. Foltx daily.  (None of which she was taking prior to arrival).   LABORATORY DATA:  Hemoglobin 14.5, white count 5.5, platelets 288,000.  Sodium 142, potassium 3.9.  BUN and creatinine 10 and 1.0.  Hemoglobin  A1c 6.1.  Homocystine 12.9.  Cholesterol 191, triglycerides 145, LDL  130, HDL 32.   PHYSICAL EXAMINATION:  VITAL SIGNS:  Blood pressure is 198/106, pulse is  52, respiratory rate 20, temperature 97.9.  GENERAL:  The patient is generally pleasant, in no acute stress.  She is  alert and oriented x3.  She is sitting comfortably in her bed.  HEENT:  Pupils equally round, reactive to light and accommodation.  Extraocular movements are intact.  Nose and throat exam is unremarkable  pink mucosa and fair dentition.  NECK:  Supple without JVD or lymphadenopathy.  CHEST:  Clear to auscultation bilaterally without wheezes, rales or  rhonchi.  HEART:  Regular rate and rhythm without murmurs, rubs or gallops.  ABDOMEN:  Soft, nontender.  Bowel sounds positive.  EXTREMITIES:  Good pulses, color and temperature throughout.  NEUROLOGIC:  Cranial nerves II-XII revealed a left central VII.  She had  no obvious tongue deviation.  Speech was dysarthric.  She had good  vision, visual acuity and intact visual fields.  Reflexes were generally  1+ to 2+ throughout.  Sensation is within normal limits and equal on  either side.  Judgment, orientation, memory and mood were all within  functional range.  Motor function was 0/5 left upper extremity, zero to  trace in the left lower extremity today.  Right-sided upper extremity  strength was generally 4+ to 5/5.  Right lower extremity motor was 5/5.   ASSESSMENT AND PLAN:  1. Functional deficit secondary to right thalamic and right internal      capsule infarcts, likely as a result of her intracranial      atherosclerosis and uncontrolled hypertension.  Begin comprehensive      inpatient rehabilitation with PT to  assess and treat for range of      motion, strengthening, mobility and pre-gait training.  OT will      assess and treat for range of motion, strengthening ADLs and      equipment.  Speech language pathology will follow for dysarthria.      The patient has no obvious swallowing issues at the moment.  Rehab      nurse will follow on a 24-hour basis for skin, bowel, bladder,      medications and safety.  Rehab case manager/social worker will      assess for psychosocial needs and discharge planning.  Estimated      length of stay is 2-3 weeks.  Goats - supervision to minimal assist      plus/minus at the wheelchair level.  Prognosis fair to good.  2. Hypertension:  Will need to titrate Catapres and Prinivil upwards      as needed.  3. Dyslipidemia:  Zocor.  4. Anticoagulation:  Lovenox 40 mg subcutaneous daily, aspirin 325 mg      p.o. daily.      Meredith Staggers, M.D.  Electronically Signed     ZTS/MEDQ  D:  02/20/2007  T:  02/20/2007  Job:  [id number]

## 2011-02-01 NOTE — Procedures (Signed)
DUPLEX DEEP VENOUS EXAM - LOWER EXTREMITY   INDICATION:  Left leg edema.   HISTORY:  Edema:  Left leg.  Trauma/Surgery:  No.  Pain:  Left leg.  PE:  No.  Previous DVT:  No.  Anticoagulants:  No.  Other:  Left medial ankle ulcer for three months.   DUPLEX EXAM:                CFV   SFV   PopV  PTV    GSV                R  L  R  L  R  L  R   L  R  L  Thrombosis    o  o     o     o      o     o  Spontaneous   +  +     +     +      +     +  Phasic        +  +     +     +      +     +  Augmentation  +  +     +     +      +     +  Compressible  +  +     +     +      +     +  Competent     +  +     +     +      +     +   Legend:  + - yes  o - no  p - partial  D - decreased   IMPRESSION:  1. No evidence of left leg deep or superficial vein thrombus or      baker's cyst.  2. During the duplex of the left superficial femoral vein, it was      noted that the left superficial femoral artery was occluded.    _____________________________  V. [name], MD   MC/MEDQ  D:  04/07/2008  T:  04/07/2008  Job:  [id number]

## 2011-02-01 NOTE — Assessment & Plan Note (Signed)
Wendy Townsend is back regarding her right thalamic stroke and left lower  extremity.  The wound is healing nicely now.  She had stenting  apparently through vascular surgeons and this helped eventually.  She is  doing wet-to-dry dressings twice a day.  She has been discharged from  the [facility] and sees Surgery back in January.  She still has  pain, but is very minimal.  She is no longer using any pain medications.  She walks with her walker short distances, but still has problems with  balance on the left side.  She is sitting here with her wheelchair  today.  She rates her pain a 2/10, describes as aching.  Pain interferes  with general activity, in relationship with others, and enjoyment of  life on a mild-to-moderate level.  Sleep is good.   REVIEW OF SYSTEMS:  Notable for the above as well as occasional nausea  and vomiting and elevated sugars.  Full review is in the written health  and history section of the chart.   SOCIAL HISTORY:  Unchanged.   PHYSICAL EXAMINATION:  VITAL SIGNS:  Blood pressure is 121/63, pulse is  91, and respiratory rate 18, and she is sating 100% on room air.  EXTREMITIES:  She still has difficulty advancing the leg and has some  antalgia.  She easily rotates the leg outward and hikes the knee to  advance the leg.  She does better with the right foot.  She is antalgic  on the left.  Dressing was intact in the left leg and wound was examined  with a dime-size area of fiber necrotic tissue on the wound.  Left upper  extremity had fair control with 3+ to 4/5 strength noted and 1/2  sensation.  Right lower extremity strength was 3/5 proximal, 2/5 with  plantar flexion, and trace with ankle dorsiflexion today.  Cognition was  intact.  NEUROLOGIC:  Cranial nerve exam was nonfocal except for less facial  sensory loss.  HEART:  Regular.  CHEST:  Clear.  ABDOMEN:  Soft and nontender.   ASSESSMENT:  1. 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  2.  Left ankle wound, improved after revascularization and aggressive      wound care.   PLAN:  1. Recommended ongoing wet-to-dry with scrubbing of wound to clean      necrotic tissue.  2. We will send the patient to outpatient physical therapy to work on      gait and more stability with walking and improvement of left lower      extremity range of motion.  I think we could look out potentially a      double-upright ankle-foot orthosis to help with gait stability and      efficiency.  She did do well with the posterior leaf ankle-foot      orthosis before.  3. I will see her back in about 2 months.  I am very pleased with her      progress.      Meredith Staggers, M.D.  Electronically Signed     ZTS/MedQ  D:  07/09/2008 11:58:43  T:  07/10/2008 01:36:16  Job #:  [id number]   cc:   Chrystine Oiler, MD

## 2011-02-04 NOTE — Discharge Summary (Signed)
[name], [name]               ACCOUNT NO.:  [id number]   MEDICAL RECORD NO.:  [id number]          PATIENT TYPE:  IPS   LOCATION:  [id number]                         FACILITY:  [facility]   PHYSICIAN:  Meredith Staggers, M.D.DATE OF BIRTH:  1951/04/19   DATE OF ADMISSION:  02/20/2007  DATE OF DISCHARGE:  03/09/2007                               DISCHARGE SUMMARY   DISCHARGE DIAGNOSES:  1. Right thalamic internal capsule infarct.  2. Hypertension.  3. Neuropathy.  4. Dyslipidemia.   HISTORY OF PRESENT ILLNESS:  [name] is a 60 year old female  with a history of basal ganglia hemorrhage in January of 2006, poorly  controlled hypertension, admitted May 21 with left-sided weakness and  slurred speech.  CT done showed remote infarct right basal ganglia and  lacunar infarct in cones.  MRI and MRA of brain and neck showed acute  infarct of right thalamus and posterior vein, right internal capsule and  extensive small vessel disease, intracranial atherosclerosis with  question of aortic plaque or pseudoaneurysm, bilateral ICA.  A 2D echo  showed EF of 55%, increased left ventricular wall thickness, mild PVR.  Patient noted to have continued left hemiparesis and decreasing balance.  Rehab consulted for further therapies.   PAST MEDICAL HISTORY:  Significant for:  1. Basal ganglia hemorrhage in January of 2006.  2. Hypertension with no medications the last couple of years,      secondary to financial difficulties.   ALLERGIES:  PENICILLIN, SWELLING.   FAMILY HISTORY:  Question of coronary artery disease.   SOCIAL HISTORY:  Patient lives with family in 1-level home with 4 steps  at entry.  Was working in [employer] prior to admission.  She  uses 1-2 cigarettes a day.  She uses alcohol occasionally.   HOSPITAL COURSE:  Ms. [name] was admitted to rehab on February 20, 2007  when patient's therapy was to consist of PT/OT daily.  Last admission,  she was admitted on aspirin for  CVA prophylaxis, subcu Lovenox for DVT  prophylaxis.  Blood pressure was monitored and usually on Catapres and  Prinivil.  As blood pressure continued to be elevated, HCTZ was added  for tighter control.  By time of discharge, blood pressure is [id number]  systolic, [id number] diastolic.  Weight prior to discharge it was 75 kg.  Labs on past admission revealed a hemoglobin of 13.4, hematocrit 39.6,  white count 5.7, platelets 252.  Check of electrolytes, sodium 140,  potassium 3.8, chloride 103, CO2 27, BUN 12, creatinine 1.20, glucose  109.  LFTs revealed a total protein of 7.0, albumin 3.4, AST 27, ALT 17,  alkaline phosphatase 112, T-bili 0.6.  Her UA done past admission showed  multibacteria .  The patient was counseled on her bowel and bladder with  toileting.  Patient, initially was Catapres TTS patch, but did develop  allergy secondary to adhesive on the patch and changed over to Catapres  p.o. and she was able to tolerate this without difficulty.  Patient with  dysesthesias on left side and was started on Neurontin to go with this  symptomatology.  Dr.  Zelson, neuro psych, was consulted for input and  followup on adjustment reaction.  He found patient with low mood  earlier at admission, but did report good spirits and denied any signs  of depression and reported motivation for program with no concerns.  She  was followed along for mood while on the rehab unit.  Patient was  treated with resting hand splint on the left.  A left AFO was also  ordered to help with gait.  Followup swallow was done past admission and  patient was maintained on a regular diet and liquids with intermittent  supervision and check for left pocketing.  By the time of discharge, the  patient's basic reading and comprehension was intact, her level of  expression was intact with speech intelligible at conversation level.  Reading comprehension was within functional limits.  Writing was not  tested, secondary to  patient being left hand dominant and left  hemiparesis.  Patient was able to maintain condition without difficulty.  Memory for immediate, working and long term information was intact.  Patient executive function was intact for problems following, reasoning  and complex stats.  Patient was at, overall supervision to min assist  for sit to stand and for tasks of self care .  She continued to  occasionally require cues for safety and locking brakes in wheelchair  use.  Left upper extremity was noted to have some distal active  movement.  Patient was noted to be modified independent for  mobility  was min-assist level for transfers, min assist for mobility overall.  The family is to provide assistance as needed past discharge, but she  does require +2 assist to navigate stairs and family was instructed  regarding safety rales bilaterally to help navigate stairs.  Patient  will continue with further follow-up with home health and PT/OT with  [facility] past discharged.  She was also set up at [facility]  on April 14, 2007 at 10:30 for medical followup and __________ .  On April 08, 2007, patient is discharged to home.   DISCHARGE MEDICATIONS:  1. Zocor 20 mg p.o. q.h.s.  2. Aspirin 325 mg a day.  3. HCTZ 25 mg a day.  4. Prinivil 40 mg in the evening.  5. Catapres 0.2 mg t.i.d.  6. Neurontin 100 mg t.i.d.   DIET:  Low-fat, low-salt.   ACTIVITY:  At intermittent supervision.  No strenuous activity.  No  alcohol.  No smoking.  No driving.  Walk only with assistance.   FOLLOWUP:  1. Patient to follow up with Dr. Naaman Plummer on July 18 at 11 a.m.  2. Follow up with Dr. Leonie Man in 4 weeks.  3. Follow up with [facility] on July 26.      Thornton Dales, P.A.      Meredith Staggers, M.D.  Electronically Signed    PP/MEDQ  D:  03/26/2007  T:  03/26/2007  Job:  [id number]   cc:   Pramod P. Leonie Man, MD

## 2011-02-04 NOTE — H&P (Signed)
NAMECHARITY, DEVIVO               ACCOUNT NO.:  [id number]   MEDICAL RECORD NO.:  [id number]          PATIENT TYPE:  INP   LOCATION:  [id number]                         FACILITY:  [facility]   PHYSICIAN:  Domingo Madeira, MD   DATE OF BIRTH:  09-Aug-1951   DATE OF ADMISSION:  09/21/2004  DATE OF DISCHARGE:                                HISTORY & PHYSICAL   PRIMARY CARE PHYSICIAN:   CHIEF COMPLAINT:  Right-sided facial droop.   HISTORY OF PRESENT ILLNESS:  This is a 60 year old [place] American female  who had gone to work.  About 1:30 p.m., her coworkers noticed that she had a  right-sided droop and her left side was dropping things.  Otherwise the  patient had no complaints.  Because of these issues, she came to the  emergency room and was noted to have a right putamen hemorrhagic stroke.  She recently had a cold and was taking some NyQuil.   PAST MEDICAL HISTORY:  Hypertension.  The patient stopped taking her  medications more than a year ago secondary to side effects such nausea.   MEDICATIONS:  None.   ALLERGIES:  PENICILLIN.  She swells up.   FAMILY HISTORY:  Her father passed away in his 41s from an MI.  Her mother  passed away in her 29s from old age.   SOCIAL HISTORY:  The patient lives in [place], [place].  She is  widowed.  She has one son.  She is a [employer].  She smokes half of a  pack per day for greater than 20 years.  No tobacco or illicit drug use.   REVIEW OF SYSTEMS:  GENERAL:  Negative.  CARDIAC:  Negative.  RESPIRATORY:  Negative.  GENITOURINARY:  Negative.  GASTROINTESTINAL:  Negative.  ENDOCRINE:  Negative.   PHYSICAL EXAMINATION:  VITAL SIGNS:  On admission, her blood pressure was  226/131 with a temperature of 99.1 degrees, pulse of 98, saturating at 100%  and respiratory rate of 17.  HEENT:  The patient's right side appeared to be droopy.  Extraocular  movements intact.  Ears with no discharge.  The throat was clear.  HEART:  Regular rate  and rhythm.  No murmurs, rubs or gallops appreciated.  LUNGS:  Clear to auscultation bilaterally.  ABDOMEN:  Soft, nontender and nondistended.  EXTREMITIES:  No cyanosis, clubbing or edema.  NEUROLOGIC:  The patient was alert and oriented x 3.  The patient had a  right-sided droop.  Decreased strength on her left side.  Her tongue  deviated to the left.  Cranial nerves were grossly intact.  The patient's  left upper extremity was more weak compared to the her left lower extremity,  however, both her upper and lower extremity had decreased strength compared  to her right.   LABORATORIES:  Sodium 142, potassium 3.9, chloride 108, BUN 10, creatinine  1.2, glucose 143.  The pH was 7.39 and bicarbonate 26.  Hemoglobin of 16,  hematocrit 49 and platelets 260.  The PT was 12 and the PTT was 27.  A CT of  the head did show an  acute putamen hemorrhagic stroke.   IMPRESSION AND PLAN:  1.  Right putamen hemorrhagic stroke.  The main goal will be to keep her      blood pressure under control, keep her head down, have neurologic check      and place her on telemetry.  Neurology has already barium enema by to      see the patient and have placed her on labetalol with followup      laboratories.  2.  Hypertension.  The goal would be 100-180/90-100 for her current      situation.  Will check cardiac enzymes.  EKG does not show any acute      events  3.  Prophylaxis.  The patient will be on SCDs.      Atta   AEJ/MEDQ  D:  09/21/2004  T:  09/21/2004  Job:  [id number]

## 2011-02-04 NOTE — Discharge Summary (Signed)
Wendy Townsend, Wendy Townsend               ACCOUNT NO.:  [id number]   MEDICAL RECORD NO.:  [id number]          PATIENT TYPE:  INP   LOCATION:  [id number]                         FACILITY:  [facility]   PHYSICIAN:  Sheila Oats, M.D.DATE OF BIRTH:  1951-06-25   DATE OF ADMISSION:  09/21/2004  DATE OF DISCHARGE:  09/27/2004                                 DISCHARGE SUMMARY   DISCHARGE DIAGNOSES:  1.  Hemorrhagic stroke, right putamen.  2.  Hypertensive emergency/malignant hypertension.  3.  Dysphagia, moderate oral and mild pharyngeal.  4.  Hypokalemia, resolved.   CONSULTATIONS:  Neurology:  Princess Bruins. Gaynell Face, M.D.   PROCEDURES AND STUDIES:  1.  CT scan of head:  A 4.6 x 1.7 cm right basal ganglia and external      capsule hematoma.  2.  MRI with MRA:  No change in right basal ganglia acute hematoma with      surrounding edema and mild midline shift, moderate chronic small vessel      ischemic changes noted.  No mass lesion identified.  MRA:  No      significant intracranial stenosis or occlusion identified.  Negative for      aneurysm.   HISTORY:  The patient is a 60 year old black female who presented with a  right facial droop.  She reported that she had gone to work at about 1:30  p.m. on the day of presentation, and her coworkers noted that she had a  right-sided facial droop, and also her left side was dropping things.  Otherwise, the patient had no complaints.  Because of this complaint, she  came to the emergency room and was noted to have a right putamen hemorrhagic  stroke.  She was admitted to the [facility] for further evaluation  and management.   PHYSICAL EXAMINATION:  VITAL SIGNS:  Upon admission as per Dr. __________  revealed a blood pressure of 226/131 with a temperature of 99.1, pulse 98,  and O2 saturation 100%, respiratory rate 17.  HEENT:  She had a right facial droop.  Her extraocular movements were  intact.  Ears had no discharge.  Throat had no exudates.  NEUROLOGIC:  Alert and oriented x 3.  The right facial droop, as above,  noted.  There was decreased strength on her left side.  Her tongue deviated  to the left.  Her strength was 3/5 in her deltoids, 4/5 in biceps and  triceps.  She had a wrist drop but was able to bring it up to the neutral  position.  In her left leg, her strength was 4/5 at the hip, knee flexors  and extensors, and 5/5 in the foot dorsiflexor and plantar flexor.  The  strength in her right side was within normal limits.   LABORATORY DATA AND OTHER STUDIES:  Sodium 142, potassium 3.9, chloride 108,  BUN 10, creatinine 1.2, glucose 143.  The Ph was 7.39, bicarb 26.  Hemoglobin 16, hematocrit 49, platelet count 260.  PT 12, PTT 27.   Imaging studies are as reported above.   HOSPITAL COURSE:  #1.  RIGHT PUTAMEN HEMORRHAGIC STROKE:  The patient was started on labetalol  drip upon admission to keep blood pressure 180 to 200/90 to 100 as per  neurology within the first 24 hours.  Dr. Gaynell Face saw the patient for  neurology and recommended that the patient receive no antiplatelet agents,  also no anticoagulants.  He also stated that a full ischemic workup was not  needed.  A homocysteine level was done, and this was mildly elevated at  14.1.  The patient was started on Foltx.  A hemoglobin A1C was also done and  was within normal limits, 5.7.  A fasting lipid profile was also done, and  the patient's total cholesterol was 167 with an HDL cholesterol of 59, LDL  88, and triglyceride level of 102.  Speech therapy was consulted, and a  swallowing evaluation was done, and the patient was found to have moderate  oral and mild pharyngeal dysphagia.  She was started on a dysphagia II diet  with nectar-thick liquids initially, and speech therapy followed her, and  this was advanced to a dysphagia III diet with thin liquids. The patient is  tolerating her diet well.  PT and OT were also consulted and saw the patient  in the  hospital.  An outpatient OT/PT was recommended.  The patient is  tolerating p.o. well at this time, able to perform her ADLs, and  hemodynamically stable.  Will be discharged to follow up with neurology in  about two months and also with [facility] in about a weeks.  She has been  counseled to quit tobacco, also marijuana.   #2.  HYPERTENSIVE EMERGENCY/MALIGNANT HYPERTENSION:  As already discussed,  the patient was started on labetalol drip upon admission, and the goal was  to titrate the drip and keep blood pressure 180 to 200/90 to 100 in the  first 24 hours as per neurology recommendation.  The patient was in the ICU  initially. When her blood pressure goal was attained and her blood pressure  was stable, she was transferred to a telemetry bed.  She was started on oral  labetalol and the dose titrated for better blood pressure control,.  Her  blood pressure was monitored and hard to control, and so, Lisinopril was  also added for good blood pressure control.  Cardiac enzymes were done upon  admission, and these were negative for infarction.  A urine drug screen was  also done and was positive for marijuana.  The patient has been counseled to  quit marijuana.  A renal ultrasound was ordered to evaluate her kidneys, and  results are pending at the time of this dictation.  Her urinalysis was  negative for protein, also no hematuria.  BUN and creatinine remained within  normal limits throughout her hospital stay with BUN 5 and creatinine 1.0.  I  discussed with the patient the importance of staying on her blood pressure  medications and also emphasized that, if she had any problems with the  medications, to discuss with her primary care physician and not to stop  taking her medications.  She voiced understanding and states that she will  be complaint with her medications.  She is to follow up with [facility] in  a week.   #3.  HYPOKALEMIA:  Her potassium was replaced while in the  hospital.  #4.  DYSPHAGIA:  As discussed above, the patient is currently on the  dysphagia III thin liquid diet.  Speech therapy following.   DISCHARGE MEDICATIONS:  1.  Labetalol 400 mg  1 p.o. b.i.d.  2.  Prinivil 20 mg p.o. b.i.d.  3.  Foltx 1 p.o. daily.   FOLLOW UP:  1.  [facility] in one week.  [facility], Dr. Gaynell Face, in two months.  3.  PT, OT, and speech therapy.   DIET:  Dysphagia III with thin liquids.   CONDITION ON DISCHARGE:  Improved, stable.       ACV/MEDQ  D:  09/26/2004  T:  09/26/2004  Job:  [id number]   cc:   Princess Bruins. Gaynell Face, M.D.  [address] [place] [address]  Fax: [place]

## 2011-02-04 NOTE — Consult Note (Signed)
NAMEMARCHELE, ZHENG               ACCOUNT NO.:  [id number]   MEDICAL RECORD NO.:  [id number]          PATIENT TYPE:  EMS   LOCATION:  MAJO                         FACILITY:  [facility]   PHYSICIAN:  Princess Bruins. Hickling, M.D.DATE OF BIRTH:  05-Apr-1951   DATE OF CONSULTATION:  09/21/2004  DATE OF DISCHARGE:                                   CONSULTATION   CHIEF COMPLAINT:  Sudden-onset left-sided weakness.   HISTORY OF PRESENT ILLNESS:  The patient was at lunch between 1 and 1:15.  She had sudden onset of slurred speech with left facial droop and left arm  weakness.  She was brought to the [facility] emergency room at 1412, Code  Stroke called en route.  CT scan of the brain 1445.  I interpreted it  immediately.  It shows a 46 x 17 mm acute hemorrhage involving the right  putamen with little mass effect.  There is also diffuse subcortical white  matter disease in the periventricular regions bioccipitally in the right  frontal region.  The patient has a 10-year history of hypertension,  previously followed by [facility], stopped her medications herself and did not  return about two to three years ago.  She has a 15-25 pack-year history of  smoking (one-half to one pack per day).  She denies use of cocaine.   PAST MEDICAL HISTORY:  See above.   REVIEW OF SYSTEMS:  No acute illnesses or chronic illnesses.  No serious  injuries or prior hospitalizations.   PAST SURGICAL HISTORY:  None.   FAMILY HISTORY:  Mother died at age 86 of natural causes.  Father died at  age 34 of myocardial infarction.  She has six sisters, three brothers, who  are alive and well.   SOCIAL HISTORY:  In addition to the above, the patient drinks one to two  drinks per week.  She denies use of drugs.  She has no children.  She lives  with a friend.  She was widowed seven years ago (husband with myocardial  infarction).   PHYSICAL EXAMINATION TODAY:  VITAL SIGNS:  Blood pressure 226/131, pulse 98,  respirations 17,  temperature 99.1, O2 saturation 100%.  HEENT:  Ear nose and throat, no bruits, no infection, no meningismus.  CHEST:  Lungs clear.  CARDIAC:  No murmurs, pulses normal.  ABDOMEN:  Soft, bowel sounds normal, no hepatosplenomegaly.  EXTREMITIES:  Normal.  No edema or cyanosis.  NEUROLOGIC:  Mental status:  The patient was awake, alert.  She names  objects, follows commands.  She has dysarthria without dysphasia.  Pupils  equal, round, and reactive, visual fields full.  Extraocular movements were  full.  She tends to have a right gaze preference but can look to the left.  Left central seventh.  Tongue is midline.  Motor examination:  Left  hemiparesis of the arm with significant drift.  She has 3/5 strength in her  deltoid, 4 biceps and triceps.  She has a wrist drop but can bring it up to  neutral position.  She has clumsy fine motor movements.  Left leg 4/5 at the  hip, knee flexor and extensor, 5/5 foot dorsiflexor and plantar flexor.  Right side is normal both for gross and fine motor skills, and no drift.  Sensory examination:  No hemihypoesthesia.  She has poor stereognosis on the  left.  Cerebellar examination:  Good finger-to-nose on the right.  Cannot  test the left. Gait was not tested.  Reflexes were diminished.  The patient  had a right flexor plantar response and equivocal left plantar response.   IMPRESSION:  1.  Hemorrhage, right putamen, 431.  2.  Malignant hypertension, 404.11.   PLAN:  1.  Titrate 180-200/90-100 with labetalol in an ICU.  2.  No antiplatelets, no anticoagulants.  3.  MRI of the brain, MRA intracranial.  4.  The patient does not need a full ischemic workup.  5.  Work up the patient's blood pressure, including renal ultrasound, and      consider MRI of the renal arteries.  6.  PT/OT.  7.  Beside swallow before eating and drinking.  8.  PAS hose for DVT prophylaxis.   Neurology will follow this patient either on the primary service or the  stroke  service.      Will   WHH/MEDQ  D:  09/21/2004  T:  09/21/2004  Job:  [id number]

## 2011-06-11 ENCOUNTER — Encounter: Payer: Self-pay | Admitting: Cardiovascular Disease

## 2011-06-17 LAB — POCT I-STAT, CHEM 8
BUN: 15
Chloride: 105
Creatinine, Ser: 1.3 — ABNORMAL HIGH
Glucose, Bld: 103 — ABNORMAL HIGH
Hemoglobin: 13.3
Potassium: 3.8
Sodium: 142

## 2011-06-20 LAB — CBC
HCT: 27.6 — ABNORMAL LOW
HCT: 31.6 — ABNORMAL LOW
Hemoglobin: 9.4 — ABNORMAL LOW
Hemoglobin: 10.6 — ABNORMAL LOW
MCHC: 33.8
MCV: 91.3
MCV: 93.3
Platelets: 296
RBC: 3.39 — ABNORMAL LOW
RDW: 13.4
RDW: 13.4
WBC: 8.4

## 2011-06-20 LAB — COMPREHENSIVE METABOLIC PANEL
ALT: 18
AST: 30
Albumin: 3.8
Calcium: 8.4
GFR calc Af Amer: 33 — ABNORMAL LOW
Sodium: 134 — ABNORMAL LOW
Total Protein: 7.2

## 2011-06-20 LAB — DIFFERENTIAL
Eosinophils Absolute: 0
Eosinophils Relative: 0
Lymphs Abs: 3
Monocytes Absolute: 1.6 — ABNORMAL HIGH
Monocytes Relative: 13 — ABNORMAL HIGH
Neutrophils Relative %: 62

## 2011-06-20 LAB — BASIC METABOLIC PANEL
BUN: 14
BUN: 19
CO2: 28
CO2: 26
Calcium: 8.6
Chloride: 101
Chloride: 97
Chloride: 108
Creatinine, Ser: 1.31 — ABNORMAL HIGH
GFR calc Af Amer: 43 — ABNORMAL LOW
GFR calc Af Amer: >60
GFR calc non Af Amer: 41 — ABNORMAL LOW
GFR calc non Af Amer: 42 — ABNORMAL LOW
GFR calc non Af Amer: 53 — ABNORMAL LOW
Glucose, Bld: 107 — ABNORMAL HIGH
Glucose, Bld: 97
Glucose, Bld: 113 — ABNORMAL HIGH
Potassium: 3.7
Potassium: 4.4
Potassium: 3.8
Potassium: 4.2
Sodium: 139
Sodium: 132 — ABNORMAL LOW
Sodium: 136

## 2011-06-20 LAB — URINALYSIS, ROUTINE W REFLEX MICROSCOPIC
Bilirubin Urine: NEGATIVE
Ketones, ur: 15 — AB
Specific Gravity, Urine: 1.011
pH: 5.5

## 2011-06-20 LAB — URINE MICROSCOPIC-ADD ON

## 2011-06-20 LAB — GLUCOSE, CAPILLARY: Glucose-Capillary: 126 — ABNORMAL HIGH

## 2011-06-20 LAB — POCT CARDIAC MARKERS: Myoglobin, poc: 175

## 2011-06-22 LAB — COMPREHENSIVE METABOLIC PANEL
ALT: 42 — ABNORMAL HIGH
CO2: 22
Calcium: 9.8
Chloride: 105
Creatinine, Ser: 1.14
GFR calc non Af Amer: 49 — ABNORMAL LOW
Glucose, Bld: 111 — ABNORMAL HIGH
Sodium: 137
Total Bilirubin: 0.8

## 2011-06-22 LAB — CBC
Hemoglobin: 12.5
MCHC: 33.8
MCHC: 34
MCV: 91.8
MCV: 92.1
RBC: 3.35 — ABNORMAL LOW
RBC: 4.01

## 2011-06-22 LAB — BLOOD GAS, ARTERIAL
Acid-base deficit: 0.7
Bicarbonate: 23.7
O2 Saturation: 97.2
pO2, Arterial: 91.5

## 2011-06-22 LAB — BASIC METABOLIC PANEL
CO2: 24
Chloride: 107
Creatinine, Ser: 1.24 — ABNORMAL HIGH
GFR calc Af Amer: 54 — ABNORMAL LOW
Potassium: 4.1

## 2011-06-22 LAB — PROTIME-INR: Prothrombin Time: 13.3

## 2011-06-22 LAB — TYPE AND SCREEN: ABO/RH(D): B POS

## 2011-06-28 ENCOUNTER — Encounter: Payer: Self-pay | Admitting: Cardiovascular Disease

## 2011-06-28 ENCOUNTER — Encounter (INDEPENDENT_AMBULATORY_CARE_PROVIDER_SITE_OTHER): Payer: Medicare Other | Admitting: Cardiovascular Disease

## 2011-06-28 DIAGNOSIS — I10 Essential (primary) hypertension: Secondary | ICD-10-CM

## 2011-06-28 DIAGNOSIS — I739 Peripheral vascular disease, unspecified: Secondary | ICD-10-CM | POA: Insufficient documentation

## 2011-06-28 DIAGNOSIS — E785 Hyperlipidemia, unspecified: Secondary | ICD-10-CM | POA: Insufficient documentation

## 2011-06-28 NOTE — Progress Notes (Signed)
Wendy Townsend Date of Birth  04/02/1951 [facility] [address]    [place], [place]  [address] [phone]  Fax  [phone]  History of Present Illness:  Wendy Townsend is a middle-aged female with a history of hypertension, hyperlipidemia and previous stroke. I saw her originally in September of 2009 at the request of Dr. Trula Slade for preoperative evaluation prior to her having fem-fem surgery. She had an adenosine Cardiolite study as part of the preoperative evaluation. She was found to have no evidence of ischemia. She has normal left ventricular systolic function with an ejection fraction of 74%.  She's not had any particular problems since I last saw her. She walks with the assistance of a cane. She continues to smoke cigarettes.  Current Outpatient Prescriptions on File Prior to Visit  Medication Sig Dispense Refill  . aspirin 325 MG tablet Take 325 mg by mouth daily.        . cloNIDine (CATAPRES) 0.2 MG tablet Take 0.1 mg by mouth 2 (two) times daily.        . hydrochlorothiazide (HYDRODIURIL) 25 MG tablet Take 25 mg by mouth daily.        Marland Kitchen lisinopril (PRINIVIL,ZESTRIL) 40 MG tablet Take 40 mg by mouth daily.        . simvastatin (ZOCOR) 40 MG tablet TAKE 1 TABLET BY MOUTH EVERY DAY  30 tablet  11  . pravastatin (PRAVACHOL) 80 MG tablet Take 80 mg by mouth daily.          Allergies  Allergen Reactions  . Penicillins     Past Medical History  Diagnosis Date  . Hypertension   . Hyperlipidemia   . Peripheral vascular disease   . History of CVA (cerebrovascular accident)   . Ischemia     Left leg  . Tobacco abuse     No past surgical history on file.  History  Smoking status  . Current Some Day Smoker  Smokeless tobacco  . Not on file    History  Alcohol Use No    Family History  Problem Relation Age of Onset  . Hypertension Mother     Reviw of Systems:  Reviewed in the HPI.  All other systems are negative.  Physical Exam: BP 153/93   Pulse 69  Ht 5' 4.5" (1.638 m)  Wt 139 lb 1.9 oz (63.104 kg)  BMI 23.51 kg/m2 The patient is alert and oriented x 3.  The mood and affect are normal.   Skin: warm and dry.  Color is normal.    HEENT:   the sclera are nonicteric.  The mucous membranes are moist.  The carotids are 2+ without bruits.  There is no thyromegaly.  There is no JVD.    Lungs: clear.  The chest wall is non tender.    Heart: regular rate with a normal S1 and S2.  There are no murmurs, gallops, or rubs. The PMI is not displaced.     Abdomen: good bowel sounds.  There is no guarding or rebound.  There is no hepatosplenomegaly or tenderness.  There are no masses.   Extremities:  no clubbing, cyanosis, or edema.  The legs are without rashes.  The distal pulses are intact.   Neuro:  Cranial nerves II - XII are intact.  Motor and sensory functions are intact.    She walks with the assistance of a cane.  ECG: Sinus bradycardia.  NS ST/T abn.  Assessment / Plan:

## 2011-06-28 NOTE — Assessment & Plan Note (Signed)
We will recheck her lipids in 6 months.

## 2011-06-28 NOTE — Assessment & Plan Note (Signed)
I've advised her to stop smoking which will help prevent further worsening of her peripheral arterial disease. She does not seem ready  to quit smoking. I'll see her again in 6 months.

## 2011-06-28 NOTE — Assessment & Plan Note (Signed)
Her BP is mildly elevated.  We had a difficult time in getting her medication list straight.  We will not add any medications at this point.  We will encourage her to stop smoking.  Return office visit in 6 months

## 2011-06-28 NOTE — Patient Instructions (Signed)
Your physician wants you to follow-up in: 6 months,  You will receive a reminder letter in the mail two months in advance. If you don't receive a letter, please call our office to schedule the follow-up appointment.  It is very important that she quit smoking. There are various alternatives available to help with this difficult task, but first and foremost, she must make a firm commitment and decision to quit. The nature of nicotine addiction is discussed. The usefulness of behavioral therapy is discussed and suggested.  The correct use, cost and side effects of nicotine replacement therapy such as gum or patches is discussed. Bupropion and its cost (sometimes not covered fully by insurance) and side effects are reviewed. The quit rates are discussed. I recommend she not allow potential costs of treatment to deter her from using nicotine replacement therapy or bupropion, as the long term economic and health benefits are obvious.  Your physician recommends that you return for lab work in: 6 months/ fasting labs

## 2011-06-29 ENCOUNTER — Telehealth: Payer: Self-pay | Admitting: Cardiovascular Disease

## 2011-06-29 NOTE — Telephone Encounter (Signed)
Pt called back and stated that she was given a list of meds 6 and she only has 5.  The one on the list that she is not taking is pravastatin.  Dr. Acie Fredrickson told her to call back and give her this info

## 2011-06-29 NOTE — Telephone Encounter (Signed)
Pt looked over her med list and she is not taking pravastatin. Pt has trouble with transportation and needs this called into [facility] on [address] asap. Please call pt when it is called in so she can tell her ride.

## 2011-06-29 NOTE — Telephone Encounter (Signed)
Called pt back/ msg unclear. Pt not on pravochol....what does she need ordered?

## 2011-06-29 NOTE — Telephone Encounter (Signed)
Pt meds correct, no refills needed at this time

## 2011-07-07 LAB — CBC
HCT: 39.6
MCHC: 33.8
MCV: 90.4
Platelets: 252
RDW: 13.6

## 2011-07-07 LAB — LIPID PANEL
Cholesterol: 191
Total CHOL/HDL Ratio: 6
VLDL: 29

## 2011-07-07 LAB — DIFFERENTIAL
Basophils Absolute: 0
Lymphocytes Relative: 42
Monocytes Absolute: 0.7
Monocytes Relative: 12 — ABNORMAL HIGH
Neutro Abs: 2.4

## 2011-07-07 LAB — URINE CULTURE: Special Requests: NEGATIVE

## 2011-07-07 LAB — COMPREHENSIVE METABOLIC PANEL
Albumin: 3.4 — ABNORMAL LOW
BUN: 12
Calcium: 9.4
Creatinine, Ser: 1.2
Total Protein: 7

## 2011-07-07 LAB — BASIC METABOLIC PANEL
BUN: 16
Chloride: 101
Glucose, Bld: 101 — ABNORMAL HIGH
Potassium: 4.3
Sodium: 138

## 2011-07-07 LAB — HOMOCYSTEINE: Homocysteine: 12.9

## 2011-08-25 ENCOUNTER — Other Ambulatory Visit: Payer: Self-pay | Admitting: Cardiovascular Disease

## 2011-11-29 ENCOUNTER — Other Ambulatory Visit: Payer: Self-pay

## 2011-11-29 MED ORDER — CLONIDINE HCL 0.2 MG PO TABS: 0.1000 mg | ORAL_TABLET | Freq: Two times a day (BID) | ORAL | Status: DC

## 2011-11-30 ENCOUNTER — Other Ambulatory Visit: Payer: Self-pay | Admitting: *Deleted

## 2011-11-30 MED ORDER — HYDROCHLOROTHIAZIDE 25 MG PO TABS: 25.0000 mg | ORAL_TABLET | Freq: Every day | ORAL | Status: DC

## 2011-11-30 MED ORDER — LISINOPRIL 40 MG PO TABS: 40.0000 mg | ORAL_TABLET | Freq: Every day | ORAL | Status: DC

## 2011-11-30 NOTE — Telephone Encounter (Signed)
Fax Received. Refill Completed. Wendy Townsend (R.M.A)   

## 2011-12-26 ENCOUNTER — Other Ambulatory Visit: Payer: Self-pay | Admitting: *Deleted

## 2011-12-26 MED ORDER — SIMVASTATIN 40 MG PO TABS: 40.0000 mg | ORAL_TABLET | Freq: Every day | ORAL | Status: DC

## 2011-12-26 NOTE — Telephone Encounter (Signed)
Pt needs appointment then refill can be made Fax Received. Refill Completed. Linet Brash Chowoe (R.M.A)   

## 2011-12-28 ENCOUNTER — Other Ambulatory Visit: Payer: Self-pay

## 2012-01-02 ENCOUNTER — Encounter: Payer: Self-pay | Admitting: Cardiovascular Disease

## 2012-01-02 ENCOUNTER — Ambulatory Visit (INDEPENDENT_AMBULATORY_CARE_PROVIDER_SITE_OTHER): Payer: Medicare Other | Admitting: Cardiovascular Disease

## 2012-01-02 ENCOUNTER — Other Ambulatory Visit: Payer: Self-pay | Admitting: *Deleted

## 2012-01-02 ENCOUNTER — Other Ambulatory Visit: Payer: Self-pay | Admitting: Cardiovascular Disease

## 2012-01-02 VITALS — BP 139/78 | HR 60 | Ht 65.0 in | Wt 150.4 lb

## 2012-01-02 DIAGNOSIS — E785 Hyperlipidemia, unspecified: Secondary | ICD-10-CM

## 2012-01-02 DIAGNOSIS — I10 Essential (primary) hypertension: Secondary | ICD-10-CM

## 2012-01-02 DIAGNOSIS — I739 Peripheral vascular disease, unspecified: Secondary | ICD-10-CM

## 2012-01-02 LAB — BASIC METABOLIC PANEL
BUN: 23 mg/dL (ref 6–23)
Chloride: 105 mEq/L (ref 96–112)
GFR: 55.19 mL/min — ABNORMAL LOW (ref >60.00)
Potassium: 4.1 mEq/L (ref 3.5–5.1)
Sodium: 142 mEq/L (ref 135–145)

## 2012-01-02 LAB — HEPATIC FUNCTION PANEL
ALT: 8 U/L (ref 0–35)
AST: 16 U/L (ref 0–37)
Alkaline Phosphatase: 83 U/L (ref 39–117)
Total Bilirubin: 0.4 mg/dL (ref 0.3–1.2)

## 2012-01-02 LAB — LIPID PANEL
Cholesterol: 154 mg/dL (ref 0–200)
LDL Cholesterol: 82 mg/dL (ref 0–99)

## 2012-01-02 NOTE — Assessment & Plan Note (Signed)
Will check labs today and again in 6 months during ov.

## 2012-01-02 NOTE — Patient Instructions (Signed)
Your physician wants you to follow-up in: 6 months You will receive a reminder letter in the mail two months in advance. If you don't receive a letter, please call our office to schedule the follow-up appointment.  Your physician recommends that you return for a FASTING lipid profile: today and in 6 months

## 2012-01-02 NOTE — Progress Notes (Signed)
    Wendy Townsend Date of Birth  [birth year] Wendy Townsend  [id number]    [place]   [place], [place]  [address]    [place],   [address] [phone]  Fax  [phone]  [phone]  Fax [phone]  Problem list: 1. Hypertension 2. Hyperlipidemia 3. Peripheral vascular disease 4. Previous CVA  History of Present Illness:  Wendy Townsend is a 61 year old female with the above-noted medical history. She has history of cigarette smoking.   She would like to try an electronic cigarette to see if she can stop.  She has been doing well from a cardiac standpoint.  She's due for fasting labs today.  Current Outpatient Prescriptions on File Prior to Visit  Medication Sig Dispense Refill  . aspirin EC 325 MG tablet TAKE 1 TABLET EVERY DAY  30 tablet  11  . cloNIDine (CATAPRES) 0.2 MG tablet Take 0.5 tablets (0.1 mg total) by mouth 2 (two) times daily.  60 tablet  11  . hydrochlorothiazide (HYDRODIURIL) 25 MG tablet Take 1 tablet (25 mg total) by mouth daily.  30 tablet  5  . lisinopril (PRINIVIL,ZESTRIL) 40 MG tablet Take 1 tablet (40 mg total) by mouth daily.  30 tablet  5  . simvastatin (ZOCOR) 40 MG tablet Take 1 tablet (40 mg total) by mouth at bedtime.  30 tablet  0    Allergies  Allergen Reactions  . Penicillins     Past Medical History  Diagnosis Date  . Hypertension   . Hyperlipidemia   . Peripheral vascular disease   . History of CVA (cerebrovascular accident)   . Ischemia     Left leg  . Tobacco abuse     No past surgical history on file.  History  Smoking status  . Current Some Day Smoker  Smokeless tobacco  . Not on file    History  Alcohol Use No    Family History  Problem Relation Age of Onset  . Hypertension Mother     Reviw of Systems:  Reviewed in the HPI.  All other systems are negative.  Physical Exam: Blood pressure 139/78, pulse 60, height 5\' 5"  (1.651 m), weight 150 lb 6.4 oz (68.221  kg). General: Well developed, well nourished, in no acute distress.  Head: Normocephalic, atraumatic, sclera non-icteric, mucus membranes are moist,   Neck: Supple. Carotids are 2 + without bruits. No JVD  Lungs: Clear bilaterally to auscultation.  Heart: regular rate.  normal  S1 S2. No murmurs, gallops or rubs.  Abdomen: Soft, non-tender, non-distended with normal bowel sounds. No hepatomegaly. No rebound/guarding. No masses.  Msk:  Strength and tone are normal  Extremities: No clubbing or cyanosis. No edema.  Distal pedal pulses are 2+ and equal bilaterally.  Neuro: Alert and oriented X 3. Moves all extremities spontaneously.  Psych:  Responds to questions appropriately with a normal affect.  ECG:  Assessment / Plan:

## 2012-01-02 NOTE — Assessment & Plan Note (Signed)
Her BP is good today.  Will continue current meds.

## 2012-01-03 ENCOUNTER — Telehealth: Payer: Self-pay | Admitting: *Deleted

## 2012-01-03 MED ORDER — SIMVASTATIN 40 MG PO TABS: 40.0000 mg | ORAL_TABLET | Freq: Every day | ORAL | Status: DC

## 2012-01-03 NOTE — Telephone Encounter (Signed)
Message copied by Jonathon Jordan on Tue Jan 03, 2012  1:31 PM ------      Message from: Thayer Headings      Created: Tue Jan 03, 2012  1:11 PM       Her labs are fairly normal.

## 2012-01-03 NOTE — Telephone Encounter (Signed)
Lab results given, meds reordered

## 2012-05-28 ENCOUNTER — Other Ambulatory Visit: Payer: Self-pay | Admitting: *Deleted

## 2012-05-28 MED ORDER — LISINOPRIL 40 MG PO TABS: 40.0000 mg | ORAL_TABLET | Freq: Every day | ORAL | Status: DC

## 2012-05-28 MED ORDER — HYDROCHLOROTHIAZIDE 25 MG PO TABS: 25.0000 mg | ORAL_TABLET | Freq: Every day | ORAL | Status: DC

## 2012-05-28 NOTE — Telephone Encounter (Signed)
Fax Received. Refill Completed. Wendy Townsend (R.M.A)   

## 2012-05-28 NOTE — Telephone Encounter (Signed)
Fax Received. Refill Completed. Taffany Heiser Chowoe (R.M.A)   

## 2012-05-30 ENCOUNTER — Other Ambulatory Visit: Payer: Self-pay | Admitting: *Deleted

## 2012-05-30 NOTE — Telephone Encounter (Signed)
Opened in Error.

## 2012-07-11 ENCOUNTER — Ambulatory Visit (INDEPENDENT_AMBULATORY_CARE_PROVIDER_SITE_OTHER): Payer: Medicare Other | Admitting: Cardiovascular Disease

## 2012-07-11 ENCOUNTER — Encounter: Payer: Self-pay | Admitting: Cardiovascular Disease

## 2012-07-11 VITALS — BP 110/68 | HR 79 | Ht 65.0 in | Wt 147.8 lb

## 2012-07-11 DIAGNOSIS — I739 Peripheral vascular disease, unspecified: Secondary | ICD-10-CM

## 2012-07-11 DIAGNOSIS — E785 Hyperlipidemia, unspecified: Secondary | ICD-10-CM

## 2012-07-11 DIAGNOSIS — I10 Essential (primary) hypertension: Secondary | ICD-10-CM

## 2012-07-11 MED ORDER — SIMVASTATIN 40 MG PO TABS: 40.0000 mg | ORAL_TABLET | Freq: Every day | ORAL | Status: DC

## 2012-07-11 NOTE — Assessment & Plan Note (Signed)
Her last lipid levels look pretty good. We'll check her fasting lipids again in 6 months.  We'll also check a basic metabolic profile and hepatic profile.

## 2012-07-11 NOTE — Assessment & Plan Note (Signed)
I've encouraged her to stop smoking once again.

## 2012-07-11 NOTE — Patient Instructions (Addendum)
Your physician wants you to follow-up in: 6 months  You will receive a reminder letter in the mail two months in advance. If you don't receive a letter, please call our office to schedule the follow-up appointment.   Your physician recommends that you return for a FASTING lipid profile:   Your physician recommends that you continue on your current medications as directed. Please refer to the Current Medication list given to you today.

## 2012-07-11 NOTE — Progress Notes (Signed)
    [name] Date of Birth  1951-03-11 [place]  [id number]    [place]   [place], [place]  [address]    [place], [place]  [address] [phone]  Fax  [phone]  [phone]  Fax [phone]  Problem list: 1. Hypertension 2. Hyperlipidemia 3. Peripheral vascular disease 4. Previous CVA  History of Present Illness:  Emony is a 61 year old female with the above-noted medical history. She has history of cigarette smoking.   She would like to try an electronic cigarette to see if she can stop.  She had reduced her smoking.  She has been doing well from a cardiac standpoint.  She's due for fasting labs today.  She just got over a cold but has no CP or dyspnea  Current Outpatient Prescriptions on File Prior to Visit  Medication Sig Dispense Refill  . aspirin EC 325 MG tablet TAKE 1 TABLET EVERY DAY  30 tablet  11  . cloNIDine (CATAPRES) 0.2 MG tablet Take 0.5 tablets (0.1 mg total) by mouth 2 (two) times daily.  60 tablet  11  . hydrochlorothiazide (HYDRODIURIL) 25 MG tablet Take 1 tablet (25 mg total) by mouth daily.  30 tablet  5  . lisinopril (PRINIVIL,ZESTRIL) 40 MG tablet Take 1 tablet (40 mg total) by mouth daily.  30 tablet  5  . simvastatin (ZOCOR) 40 MG tablet Take 1 tablet (40 mg total) by mouth at bedtime.  30 tablet  6    Allergies  Allergen Reactions  . Penicillins     Past Medical History  Diagnosis Date  . Hypertension   . Hyperlipidemia   . Peripheral vascular disease   . History of CVA (cerebrovascular accident)   . Ischemia     Left leg  . Tobacco abuse     No past surgical history on file.  History  Smoking status  . Current Some Day Smoker  Smokeless tobacco  . Not on file    History  Alcohol Use No    Family History  Problem Relation Age of Onset  . Hypertension Mother     Reviw of Systems:  Reviewed in the HPI.  All other systems are negative.  Physical Exam: Blood  pressure 110/68, pulse 79, height 5\' 5"  (1.651 m), weight 147 lb 12.8 oz (67.042 kg). General: Well developed, well nourished, in no acute distress.  Head: Normocephalic, atraumatic, sclera non-icteric, mucus membranes are moist,  Right eye is red.  Neck: Supple. Carotids are 2 + without bruits. No JVD  Lungs: Clear bilaterally to auscultation.  Heart: regular rate.  normal  S1 S2. No murmurs, gallops or rubs.  Abdomen: Soft, non-tender, non-distended with normal bowel sounds. No hepatomegaly. No rebound/guarding. No masses.  Msk:  Strength and tone are normal  Extremities: No clubbing or cyanosis. No edema.  Distal pedal pulses are 2+ and equal bilaterally.  Neuro: Alert and oriented X 3. Moves all extremities spontaneously.  Psych:  Responds to questions appropriately with a normal affect.  ECG: 07/11/2012-normal sinus rhythm at 79 beats a minute. She has nonspecific T-wave abnormality.  Assessment / Plan:

## 2012-07-11 NOTE — Assessment & Plan Note (Signed)
Her blood pressure is well-controlled. Continue with her same medications.

## 2012-07-23 ENCOUNTER — Encounter: Payer: Self-pay | Admitting: Cardiovascular Disease

## 2012-08-20 ENCOUNTER — Other Ambulatory Visit: Payer: Self-pay | Admitting: *Deleted

## 2012-08-20 MED ORDER — ASPIRIN EC 325 MG PO TBEC: 325.0000 mg | DELAYED_RELEASE_TABLET | Freq: Every day | ORAL | Status: DC

## 2012-08-20 NOTE — Telephone Encounter (Signed)
Fax Received. Refill Completed. Zak Gondek Chowoe (R.M.A)   

## 2012-09-03 ENCOUNTER — Encounter: Payer: Self-pay | Admitting: Vascular Surgery

## 2012-09-06 ENCOUNTER — Other Ambulatory Visit: Payer: Self-pay | Admitting: Cardiovascular Disease

## 2012-09-06 MED ORDER — SIMVASTATIN 40 MG PO TABS: 40.0000 mg | ORAL_TABLET | Freq: Every day | ORAL | Status: DC

## 2012-12-04 ENCOUNTER — Other Ambulatory Visit: Payer: Self-pay | Admitting: *Deleted

## 2012-12-04 MED ORDER — HYDROCHLOROTHIAZIDE 25 MG PO TABS: 25.0000 mg | ORAL_TABLET | Freq: Every day | ORAL | Status: DC

## 2012-12-05 ENCOUNTER — Other Ambulatory Visit: Payer: Self-pay | Admitting: *Deleted

## 2012-12-05 MED ORDER — LISINOPRIL 40 MG PO TABS: 40.0000 mg | ORAL_TABLET | Freq: Every day | ORAL | Status: DC

## 2012-12-05 NOTE — Telephone Encounter (Signed)
Fax Received. Refill Completed. Wendy Townsend (R.M.A)   

## 2012-12-31 ENCOUNTER — Other Ambulatory Visit: Payer: Self-pay | Admitting: *Deleted

## 2012-12-31 MED ORDER — CLONIDINE HCL 0.2 MG PO TABS: 0.2000 mg | ORAL_TABLET | Freq: Two times a day (BID) | ORAL | Status: DC

## 2012-12-31 NOTE — Telephone Encounter (Signed)
Pt states she takes 0.2mg  of her Clonidine BID. Fax Received. Refill Completed. Awesome Jared Chowoe (R.M.A)

## 2012-12-31 NOTE — Telephone Encounter (Signed)
lm for pt to call office back. need to verify how many mg she takes of her Clonidine and how many often. number provided.

## 2013-02-21 ENCOUNTER — Encounter: Payer: Self-pay | Admitting: Cardiovascular Disease

## 2013-02-21 ENCOUNTER — Ambulatory Visit (INDEPENDENT_AMBULATORY_CARE_PROVIDER_SITE_OTHER): Payer: Medicare Other | Admitting: Cardiovascular Disease

## 2013-02-21 VITALS — BP 136/80 | HR 60 | Ht 65.0 in | Wt 151.8 lb

## 2013-02-21 DIAGNOSIS — E785 Hyperlipidemia, unspecified: Secondary | ICD-10-CM

## 2013-02-21 DIAGNOSIS — I10 Essential (primary) hypertension: Secondary | ICD-10-CM

## 2013-02-21 LAB — BASIC METABOLIC PANEL
CO2: 24 mEq/L (ref 19–32)
Calcium: 9.2 mg/dL (ref 8.4–10.5)
Chloride: 105 mEq/L (ref 96–112)
Glucose, Bld: 97 mg/dL (ref 70–99)
Potassium: 4.2 mEq/L (ref 3.5–5.1)
Sodium: 141 mEq/L (ref 135–145)

## 2013-02-21 LAB — LIPID PANEL
HDL: 37.4 mg/dL — ABNORMAL LOW (ref >39.00)
LDL Cholesterol: 53 mg/dL (ref 0–99)
Total CHOL/HDL Ratio: 3
Triglycerides: 138 mg/dL (ref 0.0–149.0)
VLDL: 27.6 mg/dL (ref 0.0–40.0)

## 2013-02-21 LAB — HEPATIC FUNCTION PANEL
Albumin: 4 g/dL (ref 3.5–5.2)
Total Bilirubin: 0.6 mg/dL (ref 0.3–1.2)

## 2013-02-21 NOTE — Assessment & Plan Note (Signed)
Her BP is well controlled 

## 2013-02-21 NOTE — Patient Instructions (Addendum)
Your physician recommends that you return for a FASTING lipid profile: TODAY  Your physician wants you to follow-up in: [place] will receive a reminder letter in the mail two months in advance. If you don't receive a letter, please call our office to schedule the follow-up appointment.  Your physician recommends that you continue on your current medications as directed. Please refer to the Current Medication list given to you today.

## 2013-02-21 NOTE — Progress Notes (Signed)
    Wendy Townsend Date of Birth  06/26/1951 [place]  [id number]    [place]   [place], [place]  [address]    [place], [place]  [address] [phone]  Fax  [phone]  [phone]  Fax [phone]  Problem list: 1. Hypertension 2. Hyperlipidemia 3. Peripheral vascular disease 4. Previous CVA  History of Present Illness:  Wendy Townsend is a 62 year old female with the above-noted medical history. She has history of cigarette smoking.   She would like to try an electronic cigarette to see if she can stop.  She had reduced her smoking.  She has been doing well from a cardiac standpoint.  She's due for fasting labs today.  She just got over a cold but has no CP or dyspnea  February 21, 2013:  She has cut back on her smoking.   Current Outpatient Prescriptions on File Prior to Visit  Medication Sig Dispense Refill  . aspirin EC 325 MG tablet Take 1 tablet (325 mg total) by mouth daily.  30 tablet  11  . cloNIDine (CATAPRES) 0.2 MG tablet Take 1 tablet (0.2 mg total) by mouth 2 (two) times daily.  60 tablet  5  . hydrochlorothiazide (HYDRODIURIL) 25 MG tablet Take 1 tablet (25 mg total) by mouth daily.  30 tablet  5  . lisinopril (PRINIVIL,ZESTRIL) 40 MG tablet Take 1 tablet (40 mg total) by mouth daily.  30 tablet  5  . simvastatin (ZOCOR) 40 MG tablet Take 1 tablet (40 mg total) by mouth at bedtime.  30 tablet  11   No current facility-administered medications on file prior to visit.    Allergies  Allergen Reactions  . Penicillins     Past Medical History  Diagnosis Date  . Hypertension   . Hyperlipidemia   . Peripheral vascular disease   . History of CVA (cerebrovascular accident)   . Ischemia     Left leg  . Tobacco abuse     No past surgical history on file.  History  Smoking status  . Current Some Day Smoker  Smokeless tobacco  . Not on file    History  Alcohol Use No    Family History  Problem  Relation Age of Onset  . Hypertension Mother     Reviw of Systems:  Reviewed in the HPI.  All other systems are negative.  Physical Exam: Blood pressure 136/80, pulse 60, height 5\' 5"  (1.651 m), weight 151 lb 12.8 oz (68.856 kg). General: Well developed, well nourished, in no acute distress.  Head: Normocephalic, atraumatic, sclera non-icteric, mucus membranes are moist,  Right eye is red.  Neck: Supple. Carotids are 2 + without bruits. No JVD  Lungs: Clear bilaterally to auscultation.  Heart: regular rate.  normal  S1 S2. No murmurs, gallops or rubs.  Abdomen: Soft, non-tender, non-distended with normal bowel sounds. No hepatomegaly. No rebound/guarding. No masses.  Msk:  Strength and tone are normal  Extremities: No clubbing or cyanosis. No edema.  Distal pedal pulses are 2+ and equal bilaterally.  Neuro: Alert and oriented X 3. Moves all extremities spontaneously.  Psych:  Responds to questions appropriately with a normal affect.  ECG: 07/11/2012-normal sinus rhythm at 79 beats a minute. She has nonspecific T-wave abnormality.  Assessment / Plan:

## 2013-02-21 NOTE — Assessment & Plan Note (Signed)
She will get fasting labs today.  Continue with current meds for now.

## 2013-02-26 ENCOUNTER — Telehealth: Payer: Self-pay | Admitting: *Deleted

## 2013-02-26 DIAGNOSIS — N289 Disorder of kidney and ureter, unspecified: Secondary | ICD-10-CM

## 2013-02-26 MED ORDER — HYDROCHLOROTHIAZIDE 25 MG PO TABS: 12.5000 mg | ORAL_TABLET | Freq: Every day | ORAL | Status: DC

## 2013-02-26 NOTE — Telephone Encounter (Signed)
hctz reduced to 12.5 mg daily/ bmet in 2 weeks

## 2013-02-26 NOTE — Telephone Encounter (Signed)
Message copied by Jonathon Jordan on Tue Feb 26, 2013  3:33 PM ------      Message from: Thayer Headings      Created: Thu Feb 21, 2013  5:28 PM       Forward to her medical doctor.  Her creatinine is mildly elevated. ------

## 2013-03-13 ENCOUNTER — Other Ambulatory Visit (INDEPENDENT_AMBULATORY_CARE_PROVIDER_SITE_OTHER): Payer: Medicare Other

## 2013-03-13 DIAGNOSIS — N289 Disorder of kidney and ureter, unspecified: Secondary | ICD-10-CM

## 2013-03-13 LAB — BASIC METABOLIC PANEL
CO2: 28 mEq/L (ref 19–32)
Calcium: 9.7 mg/dL (ref 8.4–10.5)
Chloride: 101 mEq/L (ref 96–112)
Glucose, Bld: 105 mg/dL — ABNORMAL HIGH (ref 70–99)
Potassium: 4.6 mEq/L (ref 3.5–5.1)
Sodium: 137 mEq/L (ref 135–145)

## 2013-06-03 ENCOUNTER — Other Ambulatory Visit: Payer: Self-pay | Admitting: Cardiovascular Disease

## 2013-09-07 ENCOUNTER — Other Ambulatory Visit: Payer: Self-pay | Admitting: Cardiovascular Disease

## 2013-09-09 ENCOUNTER — Other Ambulatory Visit: Payer: Self-pay | Admitting: Cardiovascular Disease

## 2013-10-11 ENCOUNTER — Other Ambulatory Visit: Payer: Self-pay | Admitting: Cardiovascular Disease

## 2013-12-05 ENCOUNTER — Other Ambulatory Visit: Payer: Self-pay | Admitting: Cardiovascular Disease

## 2014-02-26 ENCOUNTER — Other Ambulatory Visit: Payer: Medicare Other

## 2014-02-26 ENCOUNTER — Other Ambulatory Visit: Payer: Self-pay | Admitting: *Deleted

## 2014-02-26 ENCOUNTER — Ambulatory Visit (INDEPENDENT_AMBULATORY_CARE_PROVIDER_SITE_OTHER): Payer: Medicare Other | Admitting: Cardiovascular Disease

## 2014-02-26 ENCOUNTER — Encounter: Payer: Self-pay | Admitting: Cardiovascular Disease

## 2014-02-26 VITALS — BP 118/80 | HR 65 | Ht 65.0 in | Wt 161.4 lb

## 2014-02-26 DIAGNOSIS — I739 Peripheral vascular disease, unspecified: Secondary | ICD-10-CM

## 2014-02-26 DIAGNOSIS — I10 Essential (primary) hypertension: Secondary | ICD-10-CM

## 2014-02-26 LAB — LIPID PANEL
CHOLESTEROL: 140 mg/dL (ref 0–200)
HDL: 37.9 mg/dL — ABNORMAL LOW (ref >39.00)
LDL Cholesterol: 75 mg/dL (ref 0–99)
NonHDL: 102.1
TRIGLYCERIDES: 136 mg/dL (ref 0.0–149.0)
Total CHOL/HDL Ratio: 4
VLDL: 27.2 mg/dL (ref 0.0–40.0)

## 2014-02-26 LAB — HEPATIC FUNCTION PANEL
ALT: 13 U/L (ref 0–35)
AST: 18 U/L (ref 0–37)
Albumin: 4 g/dL (ref 3.5–5.2)
Alkaline Phosphatase: 85 U/L (ref 39–117)
BILIRUBIN DIRECT: 0 mg/dL (ref 0.0–0.3)
BILIRUBIN TOTAL: 0.5 mg/dL (ref 0.2–1.2)
TOTAL PROTEIN: 7.4 g/dL (ref 6.0–8.3)

## 2014-02-26 LAB — BASIC METABOLIC PANEL
BUN: 11 mg/dL (ref 6–23)
CALCIUM: 9.3 mg/dL (ref 8.4–10.5)
CO2: 27 mEq/L (ref 19–32)
Chloride: 104 mEq/L (ref 96–112)
Creatinine, Ser: 1.2 mg/dL (ref 0.4–1.2)
GFR: 58.51 mL/min — AB (ref >60.00)
Glucose, Bld: 108 mg/dL — ABNORMAL HIGH (ref 70–99)
Potassium: 3.9 mEq/L (ref 3.5–5.1)
Sodium: 139 mEq/L (ref 135–145)

## 2014-02-26 MED ORDER — ASPIRIN EC 325 MG PO TBEC: DELAYED_RELEASE_TABLET | ORAL | Status: DC

## 2014-02-26 MED ORDER — LISINOPRIL 40 MG PO TABS: ORAL_TABLET | ORAL | Status: DC

## 2014-02-26 MED ORDER — SIMVASTATIN 40 MG PO TABS: ORAL_TABLET | ORAL | Status: DC

## 2014-02-26 NOTE — Patient Instructions (Addendum)
Your physician recommends that you have FASTING lipid profile: [place] wants you to follow-up in: 1 year with DR. Nahser You will receive a reminder letter in the mail two months in advance. If you don't receive a letter, please call our office to schedule the follow-up appointment.   Your physician recommends that you continue on your current medications as directed. Please refer to the Current Medication list given to you today.

## 2014-02-26 NOTE — Assessment & Plan Note (Signed)
Wendy Townsend is doing well. Her blood pressure is well controlled. Continue with same medications. We'll check labs today

## 2014-02-26 NOTE — Progress Notes (Signed)
    Wendy Townsend Date of Birth  11-Apr-1951 [place]  [id number]    [place]   [place], [place]  [address]    [place], [place]  [address] [phone]  Fax  [phone]  [phone]  Fax [phone]  Problem list: 1. Hypertension 2. Hyperlipidemia 3. Peripheral vascular disease 4. Previous CVA  History of Present Illness:  Wendy Townsend is a 63 year old female with the above-noted medical history. She has history of cigarette smoking.   She would like to try an electronic cigarette to see if she can stop.  She had reduced her smoking.  She has been doing well from a cardiac standpoint.  She's due for fasting labs today.  She just got over a cold but has no CP or dyspnea  February 21, 2013:  She has cut back on her smoking.   February 26, 2014: Wendy Townsend is doing well.  Tolerating her meds quite well.  The nighttime dose of clonidine make her  Sleepy.     Current Outpatient Prescriptions on File Prior to Visit  Medication Sig Dispense Refill  . aspirin EC 325 MG tablet TAKE 1 TABLET (325 MG TOTAL) BY MOUTH DAILY.  30 tablet  5  . cloNIDine (CATAPRES) 0.2 MG tablet TAKE 1 TABLET (0.2 MG TOTAL) BY MOUTH 2 (TWO) TIMES DAILY.  60 tablet  5  . hydrochlorothiazide (HYDRODIURIL) 25 MG tablet Take 0.5 tablets (12.5 mg total) by mouth daily.  30 tablet  5  . lisinopril (PRINIVIL,ZESTRIL) 40 MG tablet TAKE 1 TABLET BY MOUTH EVERY DAY  30 tablet  2  . simvastatin (ZOCOR) 40 MG tablet TAKE 1 TABLET (40 MG TOTAL) BY MOUTH AT BEDTIME.  30 tablet  5   No current facility-administered medications on file prior to visit.    Allergies  Allergen Reactions  . Penicillins     Past Medical History  Diagnosis Date  . Hypertension   . Hyperlipidemia   . Peripheral vascular disease   . History of CVA (cerebrovascular accident)   . Ischemia     Left leg  . Tobacco abuse     No past surgical history on file.  History  Smoking status  .  Current Some Day Smoker  Smokeless tobacco  . Not on file    History  Alcohol Use No    Family History  Problem Relation Age of Onset  . Hypertension Mother     Reviw of Systems:  Reviewed in the HPI.  All other systems are negative.  Physical Exam: Blood pressure 118/80, pulse 65, height 5\' 5"  (1.651 m), weight 161 lb 6.4 oz (73.211 kg). General: Well developed, well nourished, in no acute distress.  Head: Normocephalic, atraumatic, sclera non-icteric, mucus membranes are moist,  Right eye is red.  Neck: Supple. Carotids are 2 + without bruits. No JVD  Lungs: Clear bilaterally to auscultation.  Heart: regular rate.  normal  S1 S2. No murmurs, gallops or rubs.  Abdomen: Soft, non-tender, non-distended with normal bowel sounds. No hepatomegaly. No rebound/guarding. No masses.  Msk:  Strength and tone are normal  Extremities: No clubbing or cyanosis. No edema.  Distal pedal pulses are 2+ and equal bilaterally.  Neuro: Alert and oriented X 3. Moves all extremities spontaneously.  Psych:  Responds to questions appropriately with a normal affect.  ECG: February 26, 2014:  NSR at 65.  Assessment / Plan:

## 2014-02-28 NOTE — Progress Notes (Signed)
Quick Note:  Patient notified of lab results. Patient verbalized understanding and appreciation for phone call. Discussed diet and provided education regarding how to improve HDL results. ______

## 2014-03-10 ENCOUNTER — Other Ambulatory Visit: Payer: Self-pay | Admitting: Cardiovascular Disease

## 2014-08-04 ENCOUNTER — Other Ambulatory Visit: Payer: Self-pay | Admitting: Cardiovascular Disease

## 2014-09-04 ENCOUNTER — Other Ambulatory Visit: Payer: Self-pay | Admitting: Cardiovascular Disease

## 2014-12-04 ENCOUNTER — Other Ambulatory Visit: Payer: Self-pay

## 2014-12-04 MED ORDER — LISINOPRIL 40 MG PO TABS: 40.0000 mg | ORAL_TABLET | Freq: Every day | ORAL | Status: DC

## 2015-01-01 ENCOUNTER — Other Ambulatory Visit: Payer: Self-pay | Admitting: Cardiovascular Disease

## 2015-01-05 ENCOUNTER — Other Ambulatory Visit: Payer: Self-pay | Admitting: Cardiovascular Disease

## 2015-02-25 ENCOUNTER — Other Ambulatory Visit: Payer: Self-pay

## 2015-02-25 ENCOUNTER — Other Ambulatory Visit: Payer: Self-pay | Admitting: Cardiovascular Disease

## 2015-02-25 ENCOUNTER — Other Ambulatory Visit: Payer: Self-pay | Admitting: *Deleted

## 2015-02-25 MED ORDER — SIMVASTATIN 40 MG PO TABS: ORAL_TABLET | ORAL | Status: DC

## 2015-02-25 MED ORDER — ASPIRIN EC 325 MG PO TBEC: DELAYED_RELEASE_TABLET | ORAL | Status: DC

## 2015-02-25 NOTE — Telephone Encounter (Signed)
Per note 6.10.15

## 2015-04-22 ENCOUNTER — Ambulatory Visit (INDEPENDENT_AMBULATORY_CARE_PROVIDER_SITE_OTHER): Payer: Medicare Other | Admitting: Cardiovascular Disease

## 2015-04-22 ENCOUNTER — Encounter: Payer: Self-pay | Admitting: Cardiovascular Disease

## 2015-04-22 VITALS — BP 122/60 | HR 61 | Ht 65.0 in | Wt 157.0 lb

## 2015-04-22 DIAGNOSIS — I10 Essential (primary) hypertension: Secondary | ICD-10-CM | POA: Diagnosis not present

## 2015-04-22 DIAGNOSIS — I739 Peripheral vascular disease, unspecified: Secondary | ICD-10-CM | POA: Diagnosis not present

## 2015-04-22 DIAGNOSIS — E785 Hyperlipidemia, unspecified: Secondary | ICD-10-CM

## 2015-04-22 DIAGNOSIS — I639 Cerebral infarction, unspecified: Secondary | ICD-10-CM | POA: Diagnosis not present

## 2015-04-22 LAB — BASIC METABOLIC PANEL WITH GFR
BUN: 20 mg/dL (ref 6–23)
CO2: 29 meq/L (ref 19–32)
Calcium: 9.7 mg/dL (ref 8.4–10.5)
Chloride: 102 meq/L (ref 96–112)
Creatinine, Ser: 1.3 mg/dL — ABNORMAL HIGH (ref 0.40–1.20)
GFR: 53.15 mL/min — ABNORMAL LOW
Glucose, Bld: 106 mg/dL — ABNORMAL HIGH (ref 70–99)
Potassium: 3.4 meq/L — ABNORMAL LOW (ref 3.5–5.1)
Sodium: 139 meq/L (ref 135–145)

## 2015-04-22 LAB — LIPID PANEL
Cholesterol: 142 mg/dL (ref 0–200)
HDL: 40.9 mg/dL
LDL Cholesterol: 70 mg/dL (ref 0–99)
NonHDL: 101.22
Total CHOL/HDL Ratio: 3
Triglycerides: 155 mg/dL — ABNORMAL HIGH (ref 0.0–149.0)
VLDL: 31 mg/dL (ref 0.0–40.0)

## 2015-04-22 LAB — HEPATIC FUNCTION PANEL
ALBUMIN: 4.5 g/dL (ref 3.5–5.2)
ALT: 10 U/L (ref 0–35)
AST: 15 U/L (ref 0–37)
Alkaline Phosphatase: 108 U/L (ref 39–117)
BILIRUBIN TOTAL: 0.6 mg/dL (ref 0.2–1.2)
Bilirubin, Direct: 0.1 mg/dL (ref 0.0–0.3)
TOTAL PROTEIN: 8 g/dL (ref 6.0–8.3)

## 2015-04-22 MED ORDER — ASPIRIN EC 81 MG PO TBEC: DELAYED_RELEASE_TABLET | ORAL | Status: DC

## 2015-04-22 NOTE — Progress Notes (Signed)
Wendy Townsend Date of Birth  [birth year] [place]  [id number] [address]    [place]   [place], [place]  [address]    [place], [place]  [address] [phone]  Fax  [phone]  [phone]  Fax [phone]  Problem list: 1. Hypertension 2. Hyperlipidemia 3. Peripheral vascular disease 4. Previous CVA  History of Present Illness:  Wendy Townsend is a 64 year old female with the above-noted medical history. She has history of cigarette smoking.   She would like to try an electronic cigarette to see if she can stop.  She had reduced her smoking.  She has been doing well from a cardiac standpoint.  She's due for fasting labs today.  She just got over a cold but has no CP or dyspnea  February 21, 2013:  She has cut back on her smoking.   February 26, 2014: Wendy Townsend is doing well.  Tolerating her meds quite well.  The nighttime dose of clonidine make her  Sleepy.    Aug. 3, 2016: Wendy Townsend is doing well. No CP or dyspnea.      Current Outpatient Prescriptions on File Prior to Visit  Medication Sig Dispense Refill  . aspirin EC 325 MG tablet TAKE 1 TABLET (325 MG TOTAL) BY MOUTH DAILY. 30 tablet 3  . cloNIDine (CATAPRES) 0.2 MG tablet TAKE 1 TABLET (0.2 MG TOTAL) BY MOUTH 2 (TWO) TIMES DAILY. 60 tablet 11  . hydrochlorothiazide (HYDRODIURIL) 25 MG tablet TAKE 0.5 TABLETS (12.5 MG TOTAL) BY MOUTH DAILY. 15 tablet 2  . lisinopril (PRINIVIL,ZESTRIL) 40 MG tablet TAKE 1 TABLET (40 MG TOTAL) BY MOUTH DAILY. 30 tablet 2  . simvastatin (ZOCOR) 40 MG tablet TAKE 1 TABLET (40 MG TOTAL) BY MOUTH AT BEDTIME. 30 tablet 2   No current facility-administered medications on file prior to visit.    Allergies  Allergen Reactions  . Penicillins     Past Medical History  Diagnosis Date  . Hypertension   . Hyperlipidemia   . Peripheral vascular disease   . History of CVA (cerebrovascular accident)   . Ischemia     Left leg  . Tobacco abuse     Past  Surgical History  Procedure Laterality Date  . None      History  Smoking status  . Current Some Day Smoker  Smokeless tobacco  . Not on file    History  Alcohol Use No    Family History  Problem Relation Age of Onset  . Hypertension Mother     Reviw of Systems:  Reviewed in the HPI.  All other systems are negative.  Physical Exam: Blood pressure 122/60, pulse 61, height 5\' 5"  (1.651 m), weight 71.215 kg (157 lb). General: Well developed, well nourished, in no acute distress.  Head: Normocephalic, atraumatic, sclera non-icteric, mucus membranes are moist,  Right eye is red.  Neck: Supple. Carotids are 2 + without bruits. No JVD  Lungs: Clear bilaterally to auscultation.  Heart: regular rate.  normal  S1 S2. No murmurs, gallops or rubs.  Abdomen: Soft, non-tender, non-distended with normal bowel sounds. No hepatomegaly. No rebound/guarding. No masses.  Msk:  Strength and tone are normal  Extremities: No clubbing or cyanosis. No edema.  Distal pedal pulses are 2+ and equal bilaterally.  Neuro: Alert and oriented X 3. Moves all extremities spontaneously.  Psych:  Responds to questions appropriately with a normal affect.  ECG: February 26, 2014:  NSR at 66.  Assessment / Plan:   1. Hypertension - BP is well controlled. Continue current meds.   2. Hyperlipidemia -  Will check lipids today   3. Peripheral vascular disease  4. Previous CVA Seem to be stable    Wendy Townsend, Wendy Cheng, MD  04/22/2015 8:51 AM    [facility] [place],  [place], [place]  [address] Pager [phone] Phone: [phone]; Fax: [phone]   [facility]  [address] [place], [place]  [address] [phone]   Fax [phone]

## 2015-04-22 NOTE — Patient Instructions (Signed)
Medication Instructions:  DECREASE Aspirin to 81 mg once daily   Labwork: TODAY - cholesterol, liver and basic metabolic panel   Testing/Procedures: None Ordered   Follow-Up: Your physician wants you to follow-up in: 6 months with Dr. Acie Fredrickson.  You will receive a reminder letter in the mail two months in advance. If you don't receive a letter, please call our office to schedule the follow-up appointment.

## 2015-04-23 ENCOUNTER — Telehealth: Payer: Self-pay | Admitting: Cardiovascular Disease

## 2015-04-23 DIAGNOSIS — E876 Hypokalemia: Secondary | ICD-10-CM

## 2015-04-23 MED ORDER — POTASSIUM CHLORIDE ER 10 MEQ PO TBCR: 10.0000 meq | EXTENDED_RELEASE_TABLET | Freq: Every day | ORAL | Status: DC

## 2015-04-23 NOTE — Telephone Encounter (Signed)
New Message   Pt wants a RN to call her back for her Lab results

## 2015-04-23 NOTE — Telephone Encounter (Signed)
Spoke with pt about her lab results. pt verbalized understanding and agreed to start potassium 78meq. pt agreed to return for repeat labs.

## 2015-05-26 ENCOUNTER — Other Ambulatory Visit: Payer: Self-pay | Admitting: Cardiovascular Disease

## 2015-05-26 ENCOUNTER — Other Ambulatory Visit (INDEPENDENT_AMBULATORY_CARE_PROVIDER_SITE_OTHER): Payer: Medicare Other | Admitting: *Deleted

## 2015-05-26 DIAGNOSIS — E876 Hypokalemia: Secondary | ICD-10-CM

## 2015-05-26 LAB — BASIC METABOLIC PANEL
BUN: 17 mg/dL (ref 6–23)
CALCIUM: 9.2 mg/dL (ref 8.4–10.5)
CO2: 26 mEq/L (ref 19–32)
Chloride: 107 mEq/L (ref 96–112)
Creatinine, Ser: 1.21 mg/dL — ABNORMAL HIGH (ref 0.40–1.20)
GFR: 57.72 mL/min — AB (ref >60.00)
GLUCOSE: 110 mg/dL — AB (ref 70–99)
POTASSIUM: 4 meq/L (ref 3.5–5.1)
SODIUM: 141 meq/L (ref 135–145)

## 2015-05-29 ENCOUNTER — Other Ambulatory Visit: Payer: Self-pay | Admitting: *Deleted

## 2015-05-29 MED ORDER — SIMVASTATIN 40 MG PO TABS: ORAL_TABLET | ORAL | Status: DC

## 2015-08-04 ENCOUNTER — Other Ambulatory Visit: Payer: Self-pay | Admitting: Cardiovascular Disease

## 2015-08-30 ENCOUNTER — Other Ambulatory Visit: Payer: Self-pay | Admitting: Cardiovascular Disease

## 2015-10-22 ENCOUNTER — Encounter: Payer: Self-pay | Admitting: Cardiovascular Disease

## 2015-10-22 ENCOUNTER — Ambulatory Visit (INDEPENDENT_AMBULATORY_CARE_PROVIDER_SITE_OTHER): Payer: Commercial Managed Care - HMO | Admitting: Cardiovascular Disease

## 2015-10-22 VITALS — BP 120/66 | HR 60 | Ht 65.0 in | Wt 153.0 lb

## 2015-10-22 DIAGNOSIS — E785 Hyperlipidemia, unspecified: Secondary | ICD-10-CM

## 2015-10-22 DIAGNOSIS — I739 Peripheral vascular disease, unspecified: Secondary | ICD-10-CM

## 2015-10-22 DIAGNOSIS — I10 Essential (primary) hypertension: Secondary | ICD-10-CM | POA: Diagnosis not present

## 2015-10-22 MED ORDER — HYDROCHLOROTHIAZIDE 25 MG PO TABS: 12.5000 mg | ORAL_TABLET | Freq: Every day | ORAL | Status: DC

## 2015-10-22 MED ORDER — SIMVASTATIN 40 MG PO TABS: ORAL_TABLET | ORAL | Status: DC

## 2015-10-22 MED ORDER — CLONIDINE HCL 0.2 MG PO TABS: 0.2000 mg | ORAL_TABLET | Freq: Two times a day (BID) | ORAL | Status: DC

## 2015-10-22 MED ORDER — ASPIRIN EC 81 MG PO TBEC: DELAYED_RELEASE_TABLET | ORAL | Status: DC

## 2015-10-22 MED ORDER — LISINOPRIL 40 MG PO TABS: 40.0000 mg | ORAL_TABLET | Freq: Every day | ORAL | Status: DC

## 2015-10-22 NOTE — Progress Notes (Signed)
Wendy Townsend Date of Birth  [birth year] [place]  [id number] [address]    [place]   [place], [place]  [address]    [place], [place]  [address] [phone]  Fax  [phone]  [phone]  Fax [phone]  Problem list: 1. Hypertension 2. Hyperlipidemia 3. Peripheral vascular disease 4. Previous CVA  History of Present Illness:  Wendy Townsend is a 65 year old female with the above-noted medical history. She has history of cigarette smoking.   She would like to try an electronic cigarette to see if she can stop.  She had reduced her smoking.  She has been doing well from a cardiac standpoint.  She's due for fasting labs today.  She just got over a cold but has no CP or dyspnea  February 21, 2013:  She has cut back on her smoking.   February 26, 2014: Wendy Townsend is doing well.  Tolerating her meds quite well.  The nighttime dose of clonidine make her  Sleepy.    Aug. 3, 2016: Wendy Townsend is doing well. No CP or dyspnea.    October 22, 2015  Doing well.   BP is well controlled today .  No CP or dyspena.   Current Outpatient Prescriptions on File Prior to Visit  Medication Sig Dispense Refill  . aspirin EC 81 MG tablet TAKE 1 TABLET (325 MG TOTAL) BY MOUTH DAILY.    . cloNIDine (CATAPRES) 0.2 MG tablet TAKE 1 TABLET BY MOUTH TWICE A DAY 60 tablet 11  . hydrochlorothiazide (HYDRODIURIL) 25 MG tablet TAKE 1/2 TABLET BY MOUTH DAILY 15 tablet 5  . lisinopril (PRINIVIL,ZESTRIL) 40 MG tablet TAKE 1 TABLET BY MOUTH DAILY 30 tablet 5  . simvastatin (ZOCOR) 40 MG tablet TAKE 1 TABLET (40 MG TOTAL) BY MOUTH AT BEDTIME. 30 tablet 5   No current facility-administered medications on file prior to visit.    Allergies  Allergen Reactions  . Penicillins     Past Medical History  Diagnosis Date  . Hypertension   . Hyperlipidemia   . Peripheral vascular disease ([place])   . History of CVA (cerebrovascular accident)   . Ischemia     Left leg  .  Tobacco abuse     Past Surgical History  Procedure Laterality Date  . None      History  Smoking status  . Current Some Day Smoker  Smokeless tobacco  . Not on file    History  Alcohol Use No    Family History  Problem Relation Age of Onset  . Hypertension Mother     Reviw of Systems:  Reviewed in the HPI.  All other systems are negative.  Physical Exam: Blood pressure 120/66, pulse 60, height 5\' 5"  (1.651 m), weight 153 lb (69.4 kg). General: Well developed, well nourished, in no acute distress.  Head: Normocephalic, atraumatic, sclera non-icteric, mucus membranes are moist,  Right eye is red.  Neck: Supple. Carotids are 2 + without bruits. No JVD  Lungs: Clear bilaterally to auscultation.  Heart: regular rate.  normal  S1 S2. No murmurs, gallops or rubs.  Abdomen: Soft, non-tender, non-distended with normal bowel sounds. No hepatomegaly. No rebound/guarding. No masses.  Msk:  Strength and tone are normal  Extremities: No clubbing or cyanosis. No edema.  Distal pedal pulses are 2+ and equal bilaterally.  Neuro: Alert and oriented X 3. Moves all extremities spontaneously.  Psych:  Responds to questions appropriately with a normal affect.  ECG: February 26, 2014:  NSR at 66.  Assessment / Plan:   1. Hypertension - BP is well controlled. Continue current meds.   2. Hyperlipidemia -  Will check lipids today   3. Peripheral vascular disease  4. Previous CVA Seems to be stable    Niva Murren, Wonda Cheng, MD  10/22/2015 9:49 AM    [facility] [place],  [place], [place]  [address] Pager [phone] Phone: [phone]; Fax: [phone]   [facility]  [address] [place], [place]  [address] [phone]   Fax [phone]

## 2015-10-22 NOTE — Patient Instructions (Signed)

## 2016-04-22 ENCOUNTER — Telehealth: Payer: Self-pay | Admitting: Cardiovascular Disease

## 2016-04-22 NOTE — Telephone Encounter (Signed)
New message       The pt is having a problem with her toe nail on her foot did not provide which one, explained to the pt we did not take care of that, pt states she does not have a primary PCP, needs a ferral to the foot center cause of insurance.

## 2016-04-22 NOTE — Telephone Encounter (Signed)
PT  IN  NEED  OF  PMD   .NUMBER  GIVEN FOR  [facility] IS   [phone].

## 2016-05-03 ENCOUNTER — Other Ambulatory Visit: Payer: Self-pay | Admitting: Cardiovascular Disease

## 2016-05-28 ENCOUNTER — Emergency Department (HOSPITAL_COMMUNITY): Payer: Commercial Managed Care - HMO

## 2016-05-28 ENCOUNTER — Emergency Department (HOSPITAL_COMMUNITY)
Admission: EM | Admit: 2016-05-28 | Discharge: 2016-05-28 | Disposition: A | Discharge status: Home / Self Care | Payer: Commercial Managed Care - HMO | Attending: Emergency Medicine | Admitting: Emergency Medicine

## 2016-05-28 ENCOUNTER — Encounter (HOSPITAL_COMMUNITY): Payer: Self-pay | Admitting: Emergency Medicine

## 2016-05-28 DIAGNOSIS — F17200 Nicotine dependence, unspecified, uncomplicated: Secondary | ICD-10-CM | POA: Insufficient documentation

## 2016-05-28 DIAGNOSIS — S91105A Unspecified open wound of left lesser toe(s) without damage to nail, initial encounter: Secondary | ICD-10-CM | POA: Diagnosis not present

## 2016-05-28 DIAGNOSIS — M79675 Pain in left toe(s): Secondary | ICD-10-CM | POA: Diagnosis present

## 2016-05-28 DIAGNOSIS — I10 Essential (primary) hypertension: Secondary | ICD-10-CM | POA: Insufficient documentation

## 2016-05-28 DIAGNOSIS — B351 Tinea unguium: Secondary | ICD-10-CM

## 2016-05-28 DIAGNOSIS — Z7982 Long term (current) use of aspirin: Secondary | ICD-10-CM | POA: Diagnosis not present

## 2016-05-28 DIAGNOSIS — L03032 Cellulitis of left toe: Secondary | ICD-10-CM | POA: Diagnosis not present

## 2016-05-28 DIAGNOSIS — Z79899 Other long term (current) drug therapy: Secondary | ICD-10-CM | POA: Insufficient documentation

## 2016-05-28 MED ORDER — DOXYCYCLINE HYCLATE 100 MG PO CAPS: 100.0000 mg | ORAL_CAPSULE | Freq: Two times a day (BID) | ORAL | 0 refills | Status: DC

## 2016-05-28 MED ORDER — TRAMADOL HCL 50 MG PO TABS
50.0000 mg | ORAL_TABLET | Freq: Once | ORAL | Status: AC
  Administered 2016-05-28: 50 mg via ORAL
  Filled 2016-05-28: qty 1

## 2016-05-28 MED ORDER — TRAMADOL HCL 50 MG PO TABS: 50.0000 mg | ORAL_TABLET | Freq: Four times a day (QID) | ORAL | 0 refills | Status: DC | PRN

## 2016-05-28 NOTE — Discharge Instructions (Signed)
It is recommended that you soak your foot in warm water several times a day.  Take antibiotics as directed.  Return to the Emergency Department if the pain worsens or you develop worsening redness or swelling of the foot.  Also return to the Emergency Department if you develop a fever above 101 F.

## 2016-05-28 NOTE — ED Provider Notes (Signed)
[facility] DEPT Provider Note   CSN: [id number] Arrival date & time: 05/28/16  1807  By signing my name below, I, Shanna Cisco, attest that this documentation has been prepared under the direction and in the presence of Hyman Bible, PA-C. Electronically signed by: Shanna Cisco, ED Scribe. 05/28/16. 6:48 PM.  History   Chief Complaint Chief Complaint  Patient presents with  . Infection in toe   HPI HPI Comments:  Wendy Townsend is a 65 y.o. female with PMHx of CVA, HTN, HLD and ischemia who presents to the Emergency Department complaining of left second toe pain, which started a week ago. Pt reports that she dropped hot grease on her second left toe about a month ago and has decreased sensation in left side due to previous CVA. Associated symptoms include malodorous drainage, tingling sensation and difficulty sleeping secondary to pain. She has not taken any pain medication prior to arrival. Denies fever.   Past Medical History:  Diagnosis Date  . History of CVA (cerebrovascular accident)   . Hyperlipidemia   . Hypertension   . Ischemia    Left leg  . Peripheral vascular disease ([place])   . Tobacco abuse     Patient Active Problem List   Diagnosis Date Noted  . Hypertension 06/28/2011  . Hyperlipidemia 06/28/2011  . PAD (peripheral artery disease) ([place]) 06/28/2011    Past Surgical History:  Procedure Laterality Date  . none      OB History    No data available       Home Medications    Prior to Admission medications   Medication Sig Start Date End Date Taking? Authorizing Provider  aspirin EC 81 MG tablet TAKE 1 TABLET (325 MG TOTAL) BY MOUTH DAILY. 10/22/15   Thayer Headings, MD  cloNIDine (CATAPRES) 0.2 MG tablet Take 1 tablet (0.2 mg total) by mouth 2 (two) times daily. 10/22/15   Thayer Headings, MD  hydrochlorothiazide (HYDRODIURIL) 25 MG tablet Take 0.5 tablets (12.5 mg total) by mouth daily. 10/22/15   Thayer Headings, MD  lisinopril (PRINIVIL,ZESTRIL) 40 MG  tablet Take 1 tablet (40 mg total) by mouth daily. 10/22/15   Thayer Headings, MD  potassium chloride (MICRO-K) 10 MEQ CR capsule Take 1 capsule by mouth daily. 09/29/15   Historical Provider, MD  potassium chloride (MICRO-K) 10 MEQ CR capsule TAKE 1 TABLET (10 MEQ TOTAL) BY MOUTH DAILY. 05/03/16   Thayer Headings, MD  simvastatin (ZOCOR) 40 MG tablet TAKE 1 TABLET (40 MG TOTAL) BY MOUTH AT BEDTIME. 10/22/15   Thayer Headings, MD    Family History Family History  Problem Relation Age of Onset  . Hypertension Mother     Social History Social History  Substance Use Topics  . Smoking status: Current Some Day Smoker  . Smokeless tobacco: Not on file  . Alcohol use No     Allergies   Penicillins   Review of Systems Review of Systems 10 systems reviewed and all are negative for acute change except as noted in the HPI.   Physical Exam Updated Vital Signs BP 155/76 (BP Location: Right Arm)   Pulse 78   Temp 98.1 F (36.7 C) (Oral)   Resp 16   SpO2 100%   Physical Exam  Constitutional: She is oriented to person, place, and time. She appears well-developed and well-nourished.  HENT:  Head: Normocephalic and atraumatic.  Mouth/Throat: Oropharynx is clear and moist.  Neck: Normal range of motion.  Cardiovascular: Normal rate, regular  rhythm and normal heart sounds.   DP pulse found with doppler on the left foot  Pulmonary/Chest: Effort normal and breath sounds normal.  Musculoskeletal: Normal range of motion.  Diffuse swelling of left 2nd toe, but no erythema or warmth. Pt able to wiggle toes.  Neurological: She is alert and oriented to person, place, and time.  Distal sensation in all toes of left foot intact.  Skin: Skin is warm and dry.  Purulent drainage of second left toe lateral to the toe nail Nail thickening consistent with onychomycosis on all toes of left foot.   Psychiatric: She has a normal mood and affect.  Nursing note and vitals reviewed.   ED Treatments /  Results  DIAGNOSTIC STUDIES:  Oxygen Saturation is 100% on room air, normal by my interpretation.    COORDINATION OF CARE:  6:41 PM Discussed treatment plan with pt at bedside, which includes x-ray of left foot, and pt agreed to plan. Pt advised to follow up with podiatrist.   Labs (all labs ordered are listed, but only abnormal results are displayed) Labs Reviewed - No data to display  EKG  EKG Interpretation None       Radiology Dg Foot Complete Left  Result Date: 05/28/2016 CLINICAL DATA:  Draining wound of the left toe. EXAM: LEFT FOOT - COMPLETE 3+ VIEW COMPARISON:  None. FINDINGS: There is no evidence of fracture or dislocation. There is diffuse osteopenia. There is soft tissue swelling of the tuft of the second digit with small focus of gas with apparent connection to the skin surface. Soft tissue swelling extends dorsally to the midfoot. IMPRESSION: No evidence of osteomyelitis radiographically. Soft tissue swelling of the tuft of the second digit with small amount of gas with apparent connection to the skin surface. This may represent an open wound or a focal gas containing abscess. Electronically Signed   By: Fidela Salisbury M.D.   On: 05/28/2016 19:16    Procedures Procedures (including critical care time)  Medications Ordered in ED Medications - No data to display   Initial Impression / Assessment and Plan / ED Course  I have reviewed the triage vital signs and the nursing notes.  Pertinent labs & imaging results that were available during my care of the patient were reviewed by me and considered in my medical decision making (see chart for details).  Clinical Course   Patient presents today with pain and drainage of the 2nd toe of the left foot.  Area of drainage lateral to the toe nail and most consistent with Paronychia.  Area is actively draining.  She does have some mild swelling of the toe, but no erythema or warmth.  She also has Onychomycosis of all toe  nails on the left foot.  Xray negative for Osteomyelitis, but did show possible abscess.  Patient instructed to use warm soaks and given Rx for antibiotic.  Also given referral to Podiatry and also instructed to follow up with PCP in 2 days for recheck. She does have a history of PAD, but her left foot does not feel cold and DP pulse found with the Doppler.  Sensation of the left foot intact.  Patient discussed with Dr Eulis Foster who is in agreement with the plan.  Strict return precautions given to the patient.  Final Clinical Impressions(s) / ED Diagnoses   Final diagnoses:  Onychomycosis  Paronychia, toe, left    New Prescriptions New Prescriptions   No medications on file   I personally performed the services  described in this documentation, which was scribed in my presence. The recorded information has been reviewed and is accurate.     Hyman Bible, PA-C 05/29/16 1354    Daleen Bo, MD 05/29/16 [phone]

## 2016-05-28 NOTE — ED Notes (Signed)
Patient returned from X-ray 

## 2016-05-28 NOTE — ED Triage Notes (Signed)
Pt states she dropped some hot grease on her second toe on her L foot about a month ago. She states pain is worse now and keeping her up at night. Pt has pus drainage with foul odor from second toe on L foot. She states she has decreased sensation on L side due to stroke. She ambulates with a cane.

## 2016-06-08 ENCOUNTER — Ambulatory Visit (INDEPENDENT_AMBULATORY_CARE_PROVIDER_SITE_OTHER): Payer: Commercial Managed Care - HMO | Admitting: Internal Medicine

## 2016-06-08 ENCOUNTER — Encounter: Payer: Self-pay | Admitting: Internal Medicine

## 2016-06-08 ENCOUNTER — Other Ambulatory Visit: Payer: Self-pay | Admitting: Internal Medicine

## 2016-06-08 VITALS — BP 122/70 | HR 88 | Temp 97.5°F | Ht 65.0 in | Wt 143.0 lb

## 2016-06-08 DIAGNOSIS — L03032 Cellulitis of left toe: Secondary | ICD-10-CM | POA: Diagnosis not present

## 2016-06-08 DIAGNOSIS — Z720 Tobacco use: Secondary | ICD-10-CM | POA: Diagnosis not present

## 2016-06-08 DIAGNOSIS — E785 Hyperlipidemia, unspecified: Secondary | ICD-10-CM | POA: Diagnosis not present

## 2016-06-08 DIAGNOSIS — G8194 Hemiplegia, unspecified affecting left nondominant side: Secondary | ICD-10-CM | POA: Diagnosis not present

## 2016-06-08 DIAGNOSIS — I739 Peripheral vascular disease, unspecified: Secondary | ICD-10-CM

## 2016-06-08 DIAGNOSIS — F17200 Nicotine dependence, unspecified, uncomplicated: Secondary | ICD-10-CM | POA: Insufficient documentation

## 2016-06-08 DIAGNOSIS — I10 Essential (primary) hypertension: Secondary | ICD-10-CM | POA: Diagnosis not present

## 2016-06-08 DIAGNOSIS — Z8673 Personal history of transient ischemic attack (TIA), and cerebral infarction without residual deficits: Secondary | ICD-10-CM

## 2016-06-08 DIAGNOSIS — R17 Unspecified jaundice: Secondary | ICD-10-CM | POA: Diagnosis not present

## 2016-06-08 DIAGNOSIS — R202 Paresthesia of skin: Secondary | ICD-10-CM

## 2016-06-08 DIAGNOSIS — R200 Anesthesia of skin: Secondary | ICD-10-CM | POA: Insufficient documentation

## 2016-06-08 LAB — COMPLETE METABOLIC PANEL WITH GFR
ALT: 14 U/L (ref 6–29)
AST: 25 U/L (ref 10–35)
Albumin: 4.4 g/dL (ref 3.6–5.1)
Alkaline Phosphatase: 112 U/L (ref 33–130)
BUN: 35 mg/dL — AB (ref 7–25)
CHLORIDE: 103 mmol/L (ref 98–110)
CO2: 23 mmol/L (ref 20–31)
CREATININE: 1.52 mg/dL — AB (ref 0.50–0.99)
Calcium: 9.9 mg/dL (ref 8.6–10.4)
GFR, Est African American: 41 mL/min — ABNORMAL LOW (ref >=60)
GFR, Est Non African American: 36 mL/min — ABNORMAL LOW (ref >=60)
Glucose, Bld: 101 mg/dL — ABNORMAL HIGH (ref 65–99)
Potassium: 3.9 mmol/L (ref 3.5–5.3)
SODIUM: 138 mmol/L (ref 135–146)
Total Bilirubin: 1.3 mg/dL — ABNORMAL HIGH (ref 0.2–1.2)
Total Protein: 7.8 g/dL (ref 6.1–8.1)

## 2016-06-08 LAB — LIPID PANEL
Cholesterol: 97 mg/dL — ABNORMAL LOW (ref 125–200)
HDL: 53 mg/dL (ref >=46)
LDL CALC: 32 mg/dL (ref <130)
Total CHOL/HDL Ratio: 1.8 Ratio (ref <=5.0)
Triglycerides: 59 mg/dL (ref <150)
VLDL: 12 mg/dL (ref <30)

## 2016-06-08 LAB — CBC WITH DIFFERENTIAL/PLATELET
BASOS ABS: 97 {cells}/uL (ref 0–200)
Basophils Relative: 1 %
EOS PCT: 1 %
Eosinophils Absolute: 97 cells/uL (ref 15–500)
HCT: 39.5 % (ref 35.0–45.0)
HEMOGLOBIN: 13.4 g/dL (ref 11.7–15.5)
LYMPHS ABS: 4171 {cells}/uL — AB (ref 850–3900)
LYMPHS PCT: 43 %
MCH: 30.9 pg (ref 27.0–33.0)
MCHC: 33.9 g/dL (ref 32.0–36.0)
MCV: 91.2 fL (ref 80.0–100.0)
MPV: 10.4 fL (ref 7.5–12.5)
Monocytes Absolute: 970 cells/uL — ABNORMAL HIGH (ref 200–950)
Monocytes Relative: 10 %
NEUTROS PCT: 45 %
Neutro Abs: 4365 cells/uL (ref 1500–7800)
Platelets: 258 10*3/uL (ref 140–400)
RBC: 4.33 MIL/uL (ref 3.80–5.10)
RDW: 12.9 % (ref 11.0–15.0)
WBC: 9.7 10*3/uL (ref 3.8–10.8)

## 2016-06-08 LAB — TSH: TSH: 0.35 mIU/L — ABNORMAL LOW

## 2016-06-08 MED ORDER — LEVOFLOXACIN 750 MG PO TABS: 750.0000 mg | ORAL_TABLET | Freq: Every day | ORAL | 0 refills | Status: DC

## 2016-06-08 MED ORDER — SACCHAROMYCES BOULARDII 250 MG PO CAPS: 250.0000 mg | ORAL_CAPSULE | Freq: Two times a day (BID) | ORAL | 0 refills | Status: DC

## 2016-06-08 MED ORDER — TRAMADOL HCL 50 MG PO TABS: 50.0000 mg | ORAL_TABLET | Freq: Four times a day (QID) | ORAL | 0 refills | Status: DC | PRN

## 2016-06-08 NOTE — Patient Instructions (Addendum)
STOP DOXYCYCLINE  START levaquin 750 mg daily x 10 days for toe infection  Will call with lab results/podiatry referral  Continue other medications as ordered  STOP SMOKING  GO TO THE ER IF TOE BECOMES WORSE (BLEEDING, PAIN, MORE DISCHARGE) OR DOES NOT IMPROVE.  STOP SOAKING TOE  Follow up in 1 month for recheck of toe

## 2016-06-08 NOTE — Progress Notes (Addendum)
Patient ID: Wendy Townsend, female   DOB: 08/22/1951, 65 y.o.   MRN: [id number]     Location:  [facility] Place of Service: OFFICE    Advanced Directive information Does patient have an advance directive?: No, Would patient like information on creating an advanced directive?: No - patient declined information  Chief Complaint  Patient presents with  . Establish Care    new patient to establish care  . Medication Management    wants refill on Tramadol  . Flu Vaccine    refused    HPI:  65 yo female seen today as a new pt. She reports 1 month hx left 2nd toenail fell off and now with pain. She was seen in the ED 9/9th and was dx with paronychia that was actively draining and onychomycosis. Foot xray neg for osteomyelitis. She was RX Doxy BID x 10days. She has 1 tab left to take. She also soaked foot in warm epsom salt. She has not made appt to see podiatry yet. Tramadol helps with pain. She needs RF of tramadol  PAD - followed by cardio Dr Nahser. She takes ASA daily. She has pain in her legs with ambulation that resolves with rest. No rest limb pain. She is still smoking 3 cigs per day.  Hx CVA - stable. Takes ASA daily  HTN - BP stable on clonidine, HCTZ and lisinopril. Followed by cardio  Hypokalemia - stable on Micro-K. Last K+ 4.0  Hyperlipidemia - stable on simvastatin. LDL 70; HDL 40  CKD - stage 2. Cr 1.21  Chronic tobacco abuse - smokes 2-3 cigs/day. She has been smoking x > 30 yrs.  Past Medical History:  Diagnosis Date  . History of CVA (cerebrovascular accident)   . Hyperlipidemia   . Hypertension   . Ischemia    Left leg  . Peripheral vascular disease (HCC)   . Stroke (HCC)   . Tobacco abuse     Past Surgical History:  Procedure Laterality Date  . none      Patient Care Team: Monica Carter, DO as PCP - General (Internal Medicine) Philip J Nahser, MD as Consulting Physician (Cardiology)  Social History   Social History  . Marital status: Widowed   Spouse name: N/A  . Number of children: N/A  . Years of education: N/A   Occupational History  . Not on file.   Social History Main Topics  . Smoking status: Current Some Day Smoker    Years: 25.00    Types: Cigarettes  . Smokeless tobacco: Never Used  . Alcohol use Yes     Comment: every now and then  . Drug use: Unknown  . Sexual activity: Not on file   Other Topics Concern  . Not on file   Social History Narrative   DIET: None      DO YOU DRINK/EAT THINGS WITH CAFFEINE: no      MARITAL STATUS: Widow      WHAT YEAR WERE YOU MARRIED: 1990      DO YOU LIVE IN A HOUSE, APARTMENT, ASSISTED LIVING, CONDO TRAILER ETC.:  House      IS IT ONE OR MORE STORIES: One      HOW MANY PERSONS LIVE IN YOUR HOME: 3      DO YOU HAVE PETS IN YOUR HOME: no      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      DO YOU EXERCISE: sometimes      WHAT TYPE AND HOW OFTEN: once a week       reports that she has been smoking Cigarettes.  She has smoked for the past 25.00 years. She has never used smokeless tobacco. She reports that she drinks alcohol. Her drug history is not on file.  Family History  Problem Relation Age of Onset  . Hypertension Mother    Family Status  Relation Status  . Father Deceased at age 85   Unknown  . Mother Deceased at age 64   Nautural Death  . Sister Alive  . Brother Deceased  . Sister Alive  . Sister Deceased  . Sister Deceased  . Brother Alive  . Brother Deceased  . Sister Alive  . Sister Alive     There is no immunization history on file for this patient.  Allergies  Allergen Reactions  . Penicillins Swelling    Medications: Patient's Medications  New Prescriptions   No medications on file  Previous Medications   ASPIRIN EC 81 MG TABLET    TAKE 1 TABLET (325 MG TOTAL) BY MOUTH DAILY.   CLONIDINE (CATAPRES) 0.2 MG TABLET    Take 1 tablet (0.2 mg total) by mouth 2 (two) times daily.   DOXYCYCLINE (VIBRAMYCIN) 100 MG CAPSULE    Take 1 capsule  (100 mg total) by mouth 2 (two) times daily.   HYDROCHLOROTHIAZIDE (HYDRODIURIL) 25 MG TABLET    Take 0.5 tablets (12.5 mg total) by mouth daily.   LISINOPRIL (PRINIVIL,ZESTRIL) 40 MG TABLET    Take 1 tablet (40 mg total) by mouth daily.   POTASSIUM CHLORIDE (MICRO-K) 10 MEQ CR CAPSULE    TAKE 1 TABLET (10 MEQ TOTAL) BY MOUTH DAILY.   SIMVASTATIN (ZOCOR) 40 MG TABLET    TAKE 1 TABLET (40 MG TOTAL) BY MOUTH AT BEDTIME.   TRAMADOL (ULTRAM) 50 MG TABLET    Take 1 tablet (50 mg total) by mouth every 6 (six) hours as needed.  Modified Medications   No medications on file  Discontinued Medications   POTASSIUM CHLORIDE (MICRO-K) 10 MEQ CR CAPSULE    Take 1 capsule by mouth daily.    Review of Systems  Musculoskeletal: Positive for arthralgias, gait problem (uses 4 prong cane to ambulate) and joint swelling.  Skin: Positive for wound.  Neurological: Positive for weakness and numbness.  All other systems reviewed and are negative.   Vitals:   06/08/16 0848  BP: 122/70  Pulse: 88  Temp: 97.5 F (36.4 C)  TempSrc: Oral  SpO2: 97%  Weight: 143 lb (64.9 kg)  Height: 5' 5" (1.651 m)   Body mass index is 23.8 kg/m.  Physical Exam  Constitutional: She is oriented to person, place, and time. She appears well-developed and well-nourished.  HENT:  Mouth/Throat: Oropharynx is clear and moist. No oropharyngeal exudate.  Eyes: Pupils are equal, round, and reactive to light. No scleral icterus.  Neck: Neck supple. Carotid bruit is not present. No tracheal deviation present. No thyromegaly present.  Cardiovascular: Normal rate, regular rhythm and intact distal pulses.  Exam reveals no gallop and no friction rub.   Murmur (1/6 SEM) heard. No LE edema b/l. no calf TTP.   Pulmonary/Chest: Effort normal and breath sounds normal. No stridor. No respiratory distress. She has no wheezes. She has no rales.  Abdominal: Soft. Bowel sounds are normal. She exhibits no distension and no mass. There is no  hepatomegaly. There is no tenderness. There is no rebound and no guarding.  Musculoskeletal: She exhibits edema and tenderness.  Lymphadenopathy:    She has no cervical adenopathy.  Neurological:  She is alert and oriented to person, place, and time. She exhibits abnormal muscle tone (LLE >LUE). Gait abnormal.  Left hemiparesis  Skin: Skin is warm and dry. No rash noted.  Purulent d/c from tip of left 2nd toe with foul smell. No blood. TTP and swelling at distal foot especially 2nd toe  Psychiatric: She has a normal mood and affect. Her behavior is normal. Thought content normal. Her speech is slurred.     Labs reviewed: Office Visit on 06/08/2016  Component Date Value Ref Range Status  . HM Mammogram 09/19/2004 Self Reported Normal  0-4 Bi-Rad, Self Reported Normal Final  . HM Pap smear 09/19/2009 normal   Final    Dg Foot Complete Left  Result Date: 05/28/2016 CLINICAL DATA:  Draining wound of the left toe. EXAM: LEFT FOOT - COMPLETE 3+ VIEW COMPARISON:  None. FINDINGS: There is no evidence of fracture or dislocation. There is diffuse osteopenia. There is soft tissue swelling of the tuft of the second digit with small focus of gas with apparent connection to the skin surface. Soft tissue swelling extends dorsally to the midfoot. IMPRESSION: No evidence of osteomyelitis radiographically. Soft tissue swelling of the tuft of the second digit with small amount of gas with apparent connection to the skin surface. This may represent an open wound or a focal gas containing abscess. Electronically Signed   By: Fidela Salisbury M.D.   On: 05/28/2016 19:16     Assessment/Plan   ICD-9-CM ICD-10-CM   1. Cellulitis of toe of left foot 681.10 L03.032 CBC with Differential/Platelets     CMP with eGFR     traMADol (ULTRAM) 50 MG tablet     levofloxacin (LEVAQUIN) 750 MG tablet     saccharomyces boulardii (FLORASTOR) 250 MG capsule     WOUND CULTURE     Ambulatory referral to Podiatry  2. PAD  (peripheral artery disease) (HCC) 443.9 I73.9 CMP with eGFR     Ambulatory referral to Podiatry  3. Essential hypertension 401.9 I10 Urinalysis with Reflex Microscopic  4. History of stroke V12.54 Z86.73 CMP with eGFR  5. Left hemiparesis (HCC) 342.90 G81.94   6. Numbness and tingling of left leg 782.0 R20.2 CMP with eGFR  7. Hyperlipidemia 272.4 E78.5 Lipid Panel     TSH  8. Continuous tobacco abuse 305.1 Z72.0    Wound redressed prior to leaving office  STOP DOXYCYCLINE  START levaquin 750 mg daily x 10 days for toe infection  Will call with lab results/podiatry referral  Continue other medications as ordered  Smoking cessation discussed and highly urged  GO TO THE ER IF TOE BECOMES WORSE (BLEEDING, PAIN, MORE DISCHARGE) OR DOES NOT IMPROVE.  STOP SOAKING TOE. Keep dry dressing on toe  Follow up in 1 month for recheck of toe  Wendy Townsend  [facility] [address] [place], [place] [address] [phone] Cell (Monday-Friday 8 AM - 5 PM) [phone] After 5 PM and follow prompts

## 2016-06-09 ENCOUNTER — Other Ambulatory Visit: Payer: Self-pay

## 2016-06-09 DIAGNOSIS — R7989 Other specified abnormal findings of blood chemistry: Secondary | ICD-10-CM

## 2016-06-09 LAB — URINALYSIS, ROUTINE W REFLEX MICROSCOPIC
BILIRUBIN URINE: NEGATIVE
Glucose, UA: NEGATIVE
HGB URINE DIPSTICK: NEGATIVE
KETONES UR: NEGATIVE
Leukocytes, UA: NEGATIVE
NITRITE: NEGATIVE
PH: 5 (ref 5.0–8.0)
Protein, ur: NEGATIVE
Specific Gravity, Urine: 1.014 (ref 1.001–1.035)

## 2016-06-10 LAB — WOUND CULTURE
GRAM STAIN: NONE SEEN
GRAM STAIN: NONE SEEN

## 2016-06-10 LAB — GAMMA GT: GGT: 26 U/L (ref 7–51)

## 2016-06-13 ENCOUNTER — Other Ambulatory Visit: Payer: Self-pay

## 2016-06-13 DIAGNOSIS — R17 Unspecified jaundice: Secondary | ICD-10-CM

## 2016-06-14 ENCOUNTER — Telehealth: Payer: Self-pay | Admitting: *Deleted

## 2016-06-14 DIAGNOSIS — L03032 Cellulitis of left toe: Secondary | ICD-10-CM

## 2016-06-14 MED ORDER — TRAMADOL HCL 50 MG PO TABS: 50.0000 mg | ORAL_TABLET | Freq: Four times a day (QID) | ORAL | 0 refills | Status: DC | PRN

## 2016-06-14 NOTE — Telephone Encounter (Signed)
Patient called and requested a refill on her Tramadol. States she is out and would like a refill. Is this ok to fill? Please Advise.

## 2016-06-14 NOTE — Telephone Encounter (Signed)
Printed Rx and patient notified and agreed. Faxed to pharmacy.

## 2016-06-14 NOTE — Telephone Encounter (Signed)
Ok to Rx #60 tabs no RF

## 2016-06-17 ENCOUNTER — Ambulatory Visit: Payer: Commercial Managed Care - HMO | Admitting: Podiatry

## 2016-06-20 ENCOUNTER — Other Ambulatory Visit: Payer: Commercial Managed Care - HMO

## 2016-06-24 ENCOUNTER — Ambulatory Visit: Payer: Commercial Managed Care - HMO | Admitting: Podiatry

## 2016-07-05 ENCOUNTER — Emergency Department (HOSPITAL_COMMUNITY): Payer: Commercial Managed Care - HMO

## 2016-07-05 ENCOUNTER — Inpatient Hospital Stay (HOSPITAL_COMMUNITY)
Admission: EM | Admit: 2016-07-05 | Discharge: 2016-07-20 | DRG: 270 | Disposition: A | Discharge status: Rehabilitation Facility | Payer: Commercial Managed Care - HMO | Attending: Internal Medicine | Admitting: Internal Medicine

## 2016-07-05 ENCOUNTER — Encounter (HOSPITAL_COMMUNITY): Payer: Self-pay

## 2016-07-05 ENCOUNTER — Emergency Department (HOSPITAL_COMMUNITY)
Admit: 2016-07-05 | Discharge: 2016-07-05 | Disposition: A | Discharge status: Home / Self Care | Payer: Commercial Managed Care - HMO

## 2016-07-05 DIAGNOSIS — Z95828 Presence of other vascular implants and grafts: Secondary | ICD-10-CM | POA: Diagnosis not present

## 2016-07-05 DIAGNOSIS — R0602 Shortness of breath: Secondary | ICD-10-CM | POA: Diagnosis not present

## 2016-07-05 DIAGNOSIS — I129 Hypertensive chronic kidney disease with stage 1 through stage 4 chronic kidney disease, or unspecified chronic kidney disease: Secondary | ICD-10-CM | POA: Diagnosis present

## 2016-07-05 DIAGNOSIS — E785 Hyperlipidemia, unspecified: Secondary | ICD-10-CM | POA: Diagnosis present

## 2016-07-05 DIAGNOSIS — R9439 Abnormal result of other cardiovascular function study: Secondary | ICD-10-CM

## 2016-07-05 DIAGNOSIS — R197 Diarrhea, unspecified: Secondary | ICD-10-CM

## 2016-07-05 DIAGNOSIS — S91105A Unspecified open wound of left lesser toe(s) without damage to nail, initial encounter: Secondary | ICD-10-CM | POA: Diagnosis not present

## 2016-07-05 DIAGNOSIS — Z7982 Long term (current) use of aspirin: Secondary | ICD-10-CM

## 2016-07-05 DIAGNOSIS — I5189 Other ill-defined heart diseases: Secondary | ICD-10-CM

## 2016-07-05 DIAGNOSIS — Z4682 Encounter for fitting and adjustment of non-vascular catheter: Secondary | ICD-10-CM | POA: Diagnosis not present

## 2016-07-05 DIAGNOSIS — R5082 Postprocedural fever: Secondary | ICD-10-CM | POA: Diagnosis not present

## 2016-07-05 DIAGNOSIS — R0600 Dyspnea, unspecified: Secondary | ICD-10-CM

## 2016-07-05 DIAGNOSIS — G8918 Other acute postprocedural pain: Secondary | ICD-10-CM

## 2016-07-05 DIAGNOSIS — N170 Acute kidney failure with tubular necrosis: Secondary | ICD-10-CM | POA: Diagnosis present

## 2016-07-05 DIAGNOSIS — F17200 Nicotine dependence, unspecified, uncomplicated: Secondary | ICD-10-CM | POA: Diagnosis present

## 2016-07-05 DIAGNOSIS — G9340 Encephalopathy, unspecified: Secondary | ICD-10-CM | POA: Diagnosis not present

## 2016-07-05 DIAGNOSIS — Z880 Allergy status to penicillin: Secondary | ICD-10-CM

## 2016-07-05 DIAGNOSIS — E1122 Type 2 diabetes mellitus with diabetic chronic kidney disease: Secondary | ICD-10-CM | POA: Diagnosis not present

## 2016-07-05 DIAGNOSIS — R000 Tachycardia, unspecified: Secondary | ICD-10-CM | POA: Diagnosis not present

## 2016-07-05 DIAGNOSIS — N183 Chronic kidney disease, stage 3 unspecified: Secondary | ICD-10-CM

## 2016-07-05 DIAGNOSIS — E876 Hypokalemia: Secondary | ICD-10-CM | POA: Diagnosis not present

## 2016-07-05 DIAGNOSIS — R1030 Lower abdominal pain, unspecified: Secondary | ICD-10-CM

## 2016-07-05 DIAGNOSIS — E8770 Fluid overload, unspecified: Secondary | ICD-10-CM | POA: Diagnosis not present

## 2016-07-05 DIAGNOSIS — E1165 Type 2 diabetes mellitus with hyperglycemia: Secondary | ICD-10-CM | POA: Diagnosis not present

## 2016-07-05 DIAGNOSIS — D638 Anemia in other chronic diseases classified elsewhere: Secondary | ICD-10-CM | POA: Diagnosis not present

## 2016-07-05 DIAGNOSIS — R5381 Other malaise: Secondary | ICD-10-CM | POA: Diagnosis not present

## 2016-07-05 DIAGNOSIS — Z5189 Encounter for other specified aftercare: Secondary | ICD-10-CM

## 2016-07-05 DIAGNOSIS — Z01810 Encounter for preprocedural cardiovascular examination: Secondary | ICD-10-CM | POA: Diagnosis not present

## 2016-07-05 DIAGNOSIS — E46 Unspecified protein-calorie malnutrition: Secondary | ICD-10-CM

## 2016-07-05 DIAGNOSIS — Z8249 Family history of ischemic heart disease and other diseases of the circulatory system: Secondary | ICD-10-CM

## 2016-07-05 DIAGNOSIS — N179 Acute kidney failure, unspecified: Secondary | ICD-10-CM | POA: Diagnosis present

## 2016-07-05 DIAGNOSIS — I2510 Atherosclerotic heart disease of native coronary artery without angina pectoris: Secondary | ICD-10-CM | POA: Diagnosis not present

## 2016-07-05 DIAGNOSIS — J9811 Atelectasis: Secondary | ICD-10-CM | POA: Diagnosis not present

## 2016-07-05 DIAGNOSIS — D62 Acute posthemorrhagic anemia: Secondary | ICD-10-CM

## 2016-07-05 DIAGNOSIS — M79672 Pain in left foot: Secondary | ICD-10-CM | POA: Diagnosis present

## 2016-07-05 DIAGNOSIS — R262 Difficulty in walking, not elsewhere classified: Secondary | ICD-10-CM

## 2016-07-05 DIAGNOSIS — R7303 Prediabetes: Secondary | ICD-10-CM | POA: Diagnosis not present

## 2016-07-05 DIAGNOSIS — E43 Unspecified severe protein-calorie malnutrition: Secondary | ICD-10-CM | POA: Diagnosis not present

## 2016-07-05 DIAGNOSIS — I70222 Atherosclerosis of native arteries of extremities with rest pain, left leg: Secondary | ICD-10-CM | POA: Diagnosis not present

## 2016-07-05 DIAGNOSIS — R7302 Impaired glucose tolerance (oral): Secondary | ICD-10-CM

## 2016-07-05 DIAGNOSIS — I739 Peripheral vascular disease, unspecified: Secondary | ICD-10-CM | POA: Diagnosis present

## 2016-07-05 DIAGNOSIS — F17210 Nicotine dependence, cigarettes, uncomplicated: Secondary | ICD-10-CM | POA: Diagnosis present

## 2016-07-05 DIAGNOSIS — R739 Hyperglycemia, unspecified: Secondary | ICD-10-CM | POA: Diagnosis present

## 2016-07-05 DIAGNOSIS — Z79899 Other long term (current) drug therapy: Secondary | ICD-10-CM

## 2016-07-05 DIAGNOSIS — D6489 Other specified anemias: Secondary | ICD-10-CM | POA: Diagnosis not present

## 2016-07-05 DIAGNOSIS — Y832 Surgical operation with anastomosis, bypass or graft as the cause of abnormal reaction of the patient, or of later complication, without mention of misadventure at the time of the procedure: Secondary | ICD-10-CM | POA: Diagnosis present

## 2016-07-05 DIAGNOSIS — E8809 Other disorders of plasma-protein metabolism, not elsewhere classified: Secondary | ICD-10-CM

## 2016-07-05 DIAGNOSIS — R109 Unspecified abdominal pain: Secondary | ICD-10-CM | POA: Diagnosis not present

## 2016-07-05 DIAGNOSIS — N189 Chronic kidney disease, unspecified: Secondary | ICD-10-CM | POA: Diagnosis present

## 2016-07-05 DIAGNOSIS — I69354 Hemiplegia and hemiparesis following cerebral infarction affecting left non-dominant side: Secondary | ICD-10-CM

## 2016-07-05 DIAGNOSIS — Z8673 Personal history of transient ischemic attack (TIA), and cerebral infarction without residual deficits: Secondary | ICD-10-CM

## 2016-07-05 DIAGNOSIS — I998 Other disorder of circulatory system: Secondary | ICD-10-CM

## 2016-07-05 DIAGNOSIS — I96 Gangrene, not elsewhere classified: Secondary | ICD-10-CM | POA: Diagnosis not present

## 2016-07-05 DIAGNOSIS — D72820 Lymphocytosis (symptomatic): Secondary | ICD-10-CM

## 2016-07-05 DIAGNOSIS — J9601 Acute respiratory failure with hypoxia: Secondary | ICD-10-CM | POA: Diagnosis not present

## 2016-07-05 DIAGNOSIS — I10 Essential (primary) hypertension: Secondary | ICD-10-CM

## 2016-07-05 DIAGNOSIS — I70262 Atherosclerosis of native arteries of extremities with gangrene, left leg: Secondary | ICD-10-CM | POA: Diagnosis not present

## 2016-07-05 DIAGNOSIS — I999 Unspecified disorder of circulatory system: Secondary | ICD-10-CM

## 2016-07-05 DIAGNOSIS — E1152 Type 2 diabetes mellitus with diabetic peripheral angiopathy with gangrene: Secondary | ICD-10-CM | POA: Diagnosis present

## 2016-07-05 DIAGNOSIS — T82858A Stenosis of vascular prosthetic devices, implants and grafts, initial encounter: Principal | ICD-10-CM | POA: Diagnosis present

## 2016-07-05 DIAGNOSIS — R918 Other nonspecific abnormal finding of lung field: Secondary | ICD-10-CM | POA: Diagnosis not present

## 2016-07-05 DIAGNOSIS — R0609 Other forms of dyspnea: Secondary | ICD-10-CM

## 2016-07-05 DIAGNOSIS — I70245 Atherosclerosis of native arteries of left leg with ulceration of other part of foot: Secondary | ICD-10-CM | POA: Diagnosis not present

## 2016-07-05 DIAGNOSIS — R17 Unspecified jaundice: Secondary | ICD-10-CM | POA: Diagnosis not present

## 2016-07-05 LAB — CBC WITH DIFFERENTIAL/PLATELET
BASOS ABS: 0 10*3/uL (ref 0.0–0.1)
Basophils Relative: 0 %
EOS ABS: 0.3 10*3/uL (ref 0.0–0.7)
EOS PCT: 4 %
HCT: 38 % (ref 36.0–46.0)
Hemoglobin: 13 g/dL (ref 12.0–15.0)
LYMPHS ABS: 2.2 10*3/uL (ref 0.7–4.0)
LYMPHS PCT: 23 %
MCH: 30.9 pg (ref 26.0–34.0)
MCHC: 34.2 g/dL (ref 30.0–36.0)
MCV: 90.3 fL (ref 78.0–100.0)
MONO ABS: 0.7 10*3/uL (ref 0.1–1.0)
Monocytes Relative: 7 %
Neutro Abs: 6.2 10*3/uL (ref 1.7–7.7)
Neutrophils Relative %: 66 %
PLATELETS: 315 10*3/uL (ref 150–400)
RBC: 4.21 MIL/uL (ref 3.87–5.11)
RDW: 13 % (ref 11.5–15.5)
WBC: 9.4 10*3/uL (ref 4.0–10.5)

## 2016-07-05 LAB — BASIC METABOLIC PANEL
Anion gap: 14 (ref 5–15)
BUN: 28 mg/dL — AB (ref 6–20)
CALCIUM: 10.1 mg/dL (ref 8.9–10.3)
CO2: 22 mmol/L (ref 22–32)
Chloride: 100 mmol/L — ABNORMAL LOW (ref 101–111)
Creatinine, Ser: 1.8 mg/dL — ABNORMAL HIGH (ref 0.44–1.00)
GFR calc Af Amer: 33 mL/min — ABNORMAL LOW (ref >60)
GFR, EST NON AFRICAN AMERICAN: 29 mL/min — AB (ref >60)
GLUCOSE: 112 mg/dL — AB (ref 65–99)
Potassium: 4 mmol/L (ref 3.5–5.1)
Sodium: 136 mmol/L (ref 135–145)

## 2016-07-05 LAB — PROTIME-INR
INR: 1.07
Prothrombin Time: 13.9 seconds (ref 11.4–15.2)

## 2016-07-05 MED ORDER — ENOXAPARIN SODIUM 30 MG/0.3ML ~~LOC~~ SOLN
30.0000 mg | Freq: Every day | SUBCUTANEOUS | Status: DC
  Administered 2016-07-06 – 2016-07-07 (×3): 30 mg via SUBCUTANEOUS
  Filled 2016-07-05 (×3): qty 0.3

## 2016-07-05 MED ORDER — LACTATED RINGERS IV SOLN
INTRAVENOUS | Status: DC
  Administered 2016-07-06: 20:00:00 via INTRAVENOUS
  Administered 2016-07-06: 1000 mL via INTRAVENOUS

## 2016-07-05 MED ORDER — ACETAMINOPHEN 325 MG PO TABS
650.0000 mg | ORAL_TABLET | Freq: Four times a day (QID) | ORAL | Status: DC | PRN
  Administered 2016-07-06 – 2016-07-20 (×25): 650 mg via ORAL
  Filled 2016-07-05 (×25): qty 2

## 2016-07-05 MED ORDER — SIMVASTATIN 40 MG PO TABS
40.0000 mg | ORAL_TABLET | Freq: Every day | ORAL | Status: DC
  Administered 2016-07-06 – 2016-07-10 (×5): 40 mg via ORAL
  Filled 2016-07-05 (×5): qty 1

## 2016-07-05 MED ORDER — ONDANSETRON HCL 4 MG PO TABS: 4.0000 mg | ORAL_TABLET | Freq: Four times a day (QID) | ORAL | Status: DC | PRN

## 2016-07-05 MED ORDER — CLONIDINE HCL 0.2 MG PO TABS
0.2000 mg | ORAL_TABLET | Freq: Two times a day (BID) | ORAL | Status: DC
  Administered 2016-07-06 – 2016-07-11 (×12): 0.2 mg via ORAL
  Filled 2016-07-05 (×12): qty 1

## 2016-07-05 MED ORDER — FENTANYL CITRATE (PF) 100 MCG/2ML IJ SOLN
50.0000 ug | Freq: Once | INTRAMUSCULAR | Status: AC
  Administered 2016-07-05: 50 ug via INTRAVENOUS
  Filled 2016-07-05: qty 2

## 2016-07-05 MED ORDER — SACCHAROMYCES BOULARDII 250 MG PO CAPS
250.0000 mg | ORAL_CAPSULE | Freq: Two times a day (BID) | ORAL | Status: DC
  Administered 2016-07-06 – 2016-07-13 (×15): 250 mg via ORAL
  Filled 2016-07-05 (×15): qty 1

## 2016-07-05 MED ORDER — ASPIRIN EC 81 MG PO TBEC
81.0000 mg | DELAYED_RELEASE_TABLET | Freq: Every day | ORAL | Status: DC
  Administered 2016-07-06 – 2016-07-14 (×7): 81 mg via ORAL
  Filled 2016-07-05 (×7): qty 1

## 2016-07-05 MED ORDER — SENNOSIDES-DOCUSATE SODIUM 8.6-50 MG PO TABS
1.0000 | ORAL_TABLET | Freq: Every evening | ORAL | Status: DC | PRN
  Administered 2016-07-08 – 2016-07-12 (×2): 1 via ORAL
  Filled 2016-07-05 (×3): qty 1

## 2016-07-05 MED ORDER — ACETAMINOPHEN 650 MG RE SUPP: 650.0000 mg | Freq: Four times a day (QID) | RECTAL | Status: DC | PRN

## 2016-07-05 MED ORDER — ONDANSETRON HCL 4 MG/2ML IJ SOLN
4.0000 mg | Freq: Four times a day (QID) | INTRAMUSCULAR | Status: DC | PRN
  Administered 2016-07-08 – 2016-07-15 (×2): 4 mg via INTRAVENOUS
  Filled 2016-07-05 (×2): qty 2

## 2016-07-05 MED ORDER — MORPHINE SULFATE (PF) 2 MG/ML IV SOLN
2.0000 mg | INTRAVENOUS | Status: DC | PRN
Start: 2016-07-05 — End: 2016-07-12
  Administered 2016-07-06 – 2016-07-12 (×16): 2 mg via INTRAVENOUS
  Filled 2016-07-05 (×17): qty 1

## 2016-07-05 NOTE — ED Notes (Signed)
Patient stable to be taken to the floor at this time.  EMT will transport patient to [place].  All belongings taken by patient and family.

## 2016-07-05 NOTE — ED Provider Notes (Signed)
[facility] DEPT Provider Note   CSN: [id number] Arrival date & time: 07/05/16  1304     History   Chief Complaint Chief Complaint  Patient presents with  . Toe Injury    HPI Wendy Townsend is a 65 y.o. female.  Patient reports intermittent toe pain for about 2 months.  She was seen in September for what appeared to be an infection of her 2nd left toe as well as foot cellulitis. Patient was treated with antibiotics at that time with improvement in the foot cellulitis, but toe pain persisted. Patient has history of PAD and prior left leg ischemia.   The history is provided by the patient and medical records. No language interpreter was used.  Extremity Pain  This is a recurrent problem. The current episode started more than 1 week ago. The problem occurs constantly. The problem has been gradually worsening. She has tried rest for the symptoms. The treatment provided no relief.    Past Medical History:  Diagnosis Date  . History of CVA (cerebrovascular accident)   . Hyperlipidemia   . Hypertension   . Ischemia    Left leg  . Peripheral vascular disease ([place])   . Stroke ([place])   . Tobacco abuse     Patient Active Problem List   Diagnosis Date Noted  . History of stroke 06/08/2016  . Left hemiparesis ([place]) 06/08/2016  . Numbness and tingling of left leg 06/08/2016  . Continuous tobacco abuse 06/08/2016  . Hypertension 06/28/2011  . Hyperlipidemia 06/28/2011  . PAD (peripheral artery disease) ([place]) 06/28/2011    Past Surgical History:  Procedure Laterality Date  . none      OB History    No data available       Home Medications    Prior to Admission medications   Medication Sig Start Date End Date Taking? Authorizing Provider  aspirin EC 81 MG tablet TAKE 1 TABLET (325 MG TOTAL) BY MOUTH DAILY. 10/22/15  Yes Thayer Headings, MD  cloNIDine (CATAPRES) 0.2 MG tablet Take 1 tablet (0.2 mg total) by mouth 2 (two) times daily. 10/22/15  Yes Thayer Headings, MD    hydrochlorothiazide (HYDRODIURIL) 25 MG tablet Take 0.5 tablets (12.5 mg total) by mouth daily. Patient taking differently: Take 12.5 mg by mouth every other day.  10/22/15  Yes Thayer Headings, MD  lisinopril (PRINIVIL,ZESTRIL) 40 MG tablet Take 1 tablet (40 mg total) by mouth daily. 10/22/15  Yes Thayer Headings, MD  potassium chloride (MICRO-K) 10 MEQ CR capsule TAKE 1 TABLET (10 MEQ TOTAL) BY MOUTH DAILY. 05/03/16  Yes Thayer Headings, MD  saccharomyces boulardii (FLORASTOR) 250 MG capsule Take 1 capsule (250 mg total) by mouth 2 (two) times daily. 06/08/16  Yes Monica Eulas Post, DO  simvastatin (ZOCOR) 40 MG tablet TAKE 1 TABLET (40 MG TOTAL) BY MOUTH AT BEDTIME. 10/22/15  Yes Thayer Headings, MD  doxycycline (VIBRAMYCIN) 100 MG capsule Take 1 capsule (100 mg total) by mouth 2 (two) times daily. Patient not taking: Reported on 07/05/2016 05/28/16   Hyman Bible, PA-C  levofloxacin (LEVAQUIN) 750 MG tablet Take 1 tablet (750 mg total) by mouth daily. Patient not taking: Reported on 07/05/2016 06/08/16   Gildardo Cranker, DO  traMADol (ULTRAM) 50 MG tablet Take 1 tablet (50 mg total) by mouth every 6 (six) hours as needed. Patient not taking: Reported on 07/05/2016 06/14/16   Lauree Chandler, NP    Family History Family History  Problem Relation Age of Onset  .  Hypertension Mother     Social History Social History  Substance Use Topics  . Smoking status: Current Some Day Smoker    Years: 25.00    Types: Cigarettes  . Smokeless tobacco: Never Used  . Alcohol use Yes     Comment: every now and then     Allergies   Penicillins   Review of Systems Review of Systems  Musculoskeletal:       Left foot, second toe pain  All other systems reviewed and are negative.    Physical Exam Updated Vital Signs BP 104/86   Pulse 91   Temp 97.9 F (36.6 C) (Oral)   Resp 14   SpO2 100%   Physical Exam  Constitutional: She is oriented to person, place, and time. She appears well-developed and  well-nourished.  HENT:  Mouth/Throat: Oropharynx is clear and moist.  Eyes: Conjunctivae are normal.  Neck: Neck supple.  Cardiovascular: Normal rate and regular rhythm.   Pulmonary/Chest: Effort normal and breath sounds normal.  Abdominal: Soft. Bowel sounds are normal.  Musculoskeletal:  Necrotic appearing 2nd left toe. No palpable or dopplered pulse noted in left foot. Foot blue and cold to touch.  Neurological: She is alert and oriented to person, place, and time.  Skin: Skin is warm and dry.  Psychiatric: She has a normal mood and affect.  Nursing note and vitals reviewed.      ED Treatments / Results  Labs (all labs ordered are listed, but only abnormal results are displayed) Labs Reviewed  BASIC METABOLIC PANEL - Abnormal; Notable for the following:       Result Value   Chloride 100 (*)    Glucose, Bld 112 (*)    BUN 28 (*)    Creatinine, Ser 1.80 (*)    GFR calc non Af Amer 29 (*)    GFR calc Af Amer 33 (*)    All other components within normal limits  CBC WITH DIFFERENTIAL/PLATELET  PROTIME-INR    EKG  EKG Interpretation None       Radiology Dg Foot Complete Left  Result Date: 07/05/2016 CLINICAL DATA:  Necrotic wound on second toe left foot. EXAM: LEFT FOOT - COMPLETE 3+ VIEW COMPARISON:  Left foot radiograph 05/28/2016 FINDINGS: The bones are osteopenic. There is no fracture or dislocation. Joint spaces are maintained. There is again seen a soft tissue wound of the distal second toe. No focal osteolysis is identified. IMPRESSION: No radiographic evidence of osteomyelitis. Soft tissue wound of the distal aspect of the left second toe. Electronically Signed   By: Ulyses Jarred M.D.   On: 07/05/2016 19:57    Procedures Procedures (including critical care time)  Medications Ordered in ED Medications - No data to display   Initial Impression / Assessment and Plan / ED Course  I have reviewed the triage vital signs and the nursing notes.  Pertinent  labs & imaging results that were available during my care of the patient were reviewed by me and considered in my medical decision making (see chart for details).  Clinical Course  Patient with ischemic left foot and necrotic 2nd left toe.  ABI completed with no flow to left foot.  No osteomyelitis on plain film.  Discussed with vascular ([facility]). As this has been an ongoing issue for several months, he recommends admission to hospitalist and he will follow-up wioh patient in the morning.  No additional interventions needed at present. Screening lab work obtained, and patient admitted to hospitalist service.  Final Clinical Impressions(s) / ED Diagnoses   Final diagnoses:  Ischemic pain of foot, left    New Prescriptions New Prescriptions   No medications on file     Etta Quill, NP 07/05/16 2253    Duffy Bruce, MD 07/07/16 1500

## 2016-07-05 NOTE — ED Triage Notes (Addendum)
Patient here with left foot 2nd toe pain for 2 months. States that she was seen in ED several weeks ago and nail was removed and now want walk or try to ambulate due to pain. Decreased intake and mobility due to same

## 2016-07-05 NOTE — ED Triage Notes (Signed)
Hope NP  Stated ok to put pt. In Room 10

## 2016-07-05 NOTE — H&P (Signed)
History and Physical    Wendy Townsend JHE:[id number] DOB: 1950-10-14 DOA: 07/05/2016  PCP: Gildardo Cranker, DO Consultants:  Cardiology - [place] [facility] Patient coming from: home - lives with nephew and brother and occasionally niece; NOK: niece  Chief Complaint: toe issue  HPI: Wendy Townsend is a 65 y.o. female with medical history significant of CVA, PVD, HTN, HLD, tobacco abuse presenting with a necrotic toe.  She splashed hot grease on her left second toe in early September and was seen at [facility] on September 9.   She was diagnosed with a paronychia with possible abscess, xray negative for osteomyelitis. She was given Tramadol and Doxy x 10 days.  At that time, her foot did not feel cold and a DP pulse was found with the Doppler.  Sensation was noted to be intact.    She established care with Dr. Eulas Post on 9/20.  She was diagnosed with cellulitis of the left second toe and given Levaquin 750 mg x 10 days; a wound culture was obtained and she was referred (again) to podiatry.    Now can hardly walk on foot.  It has worsened over the last 2 weeks.  +pain.  Denies drainage.  "It looks so pitiful I don't even look at it sometimes."   ED Course: Per NP Tamala Julian: Patient with ischemic left foot and necrotic 2nd left toe.  ABI completed with no flow to left foot.  No osteomyelitis on plain film.  Discussed with vascular ([place]). As this has been an ongoing issue for several months, he recommends admission to hospitalist and he will follow-up wioh patient in the morning.  No additional interventions needed at present. Screening lab work obtained, and patient admitted to hospitalist service.    Review of Systems: As per HPI; otherwise 10 point review of systems reviewed and negative.   Ambulatory Status:  Can stand and pivot but difficulty walking more than a couple \\of  steps  Past Medical History:  Diagnosis Date  . Hyperlipidemia   . Hypertension   . Peripheral vascular disease ([place])    vein surgery, left leg  . Stroke [facility]) 2002   ongoing mild loss of feeling in left side, had to learn to write right-handed  . Tobacco abuse     Past Surgical History:  Procedure Laterality Date  . VEIN SURGERY Left     Social History   Social History  . Marital status: Widowed    Spouse name: N/A  . Number of children: N/A  . Years of education: N/A   Occupational History  . retired    Social History Main Topics  . Smoking status: Current Some Day Smoker    Years: 25.00    Types: Cigarettes  . Smokeless tobacco: Never Used     Comment: currently down to a few cigarettes per day  . Alcohol use Yes     Comment: every now and then, last drink was maybe in July  . Drug use:     Types: Marijuana  . Sexual activity: Not on file   Other Topics Concern  . Not on file   Social History Narrative   DIET: None      DO YOU DRINK/EAT THINGS WITH CAFFEINE: no      MARITAL STATUS: Widow      WHAT YEAR WERE YOU MARRIED: 1990      DO YOU LIVE IN A HOUSE, APARTMENT, ASSISTED LIVING, CONDO TRAILER ETC.:  House      IS IT ONE OR MORE  STORIES: One      HOW MANY PERSONS LIVE IN YOUR HOME: 3      DO YOU HAVE PETS IN YOUR HOME: no      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      DO YOU EXERCISE: sometimes      WHAT TYPE AND HOW OFTEN: once a week    Allergies  Allergen Reactions  . Penicillins Swelling    Has patient had a PCN reaction causing immediate rash, facial/tongue/throat swelling, SOB or lightheadedness with hypotension: YES Has patient had a PCN reaction causing severe rash involving mucus membranes or skin necrosis: NO Has patient had a PCN reaction that required hospitalization NO Has patient had a PCN reaction occurring within the last 10 years: NO If all of the above answers are "NO", then may proceed with Cephalosporin use.    Family History  Problem Relation Age of Onset  . Hypertension Mother 39    Prior to Admission medications   Medication Sig Start  Date End Date Taking? Authorizing Provider  aspirin EC 81 MG tablet TAKE 1 TABLET (325 MG TOTAL) BY MOUTH DAILY. 10/22/15  Yes Thayer Headings, MD  cloNIDine (CATAPRES) 0.2 MG tablet Take 1 tablet (0.2 mg total) by mouth 2 (two) times daily. 10/22/15  Yes Thayer Headings, MD  hydrochlorothiazide (HYDRODIURIL) 25 MG tablet Take 0.5 tablets (12.5 mg total) by mouth daily. Patient taking differently: Take 12.5 mg by mouth every other day.  10/22/15  Yes Thayer Headings, MD  lisinopril (PRINIVIL,ZESTRIL) 40 MG tablet Take 1 tablet (40 mg total) by mouth daily. 10/22/15  Yes Thayer Headings, MD  potassium chloride (MICRO-K) 10 MEQ CR capsule TAKE 1 TABLET (10 MEQ TOTAL) BY MOUTH DAILY. 05/03/16  Yes Thayer Headings, MD  saccharomyces boulardii (FLORASTOR) 250 MG capsule Take 1 capsule (250 mg total) by mouth 2 (two) times daily. 06/08/16  Yes Monica Eulas Post, DO  simvastatin (ZOCOR) 40 MG tablet TAKE 1 TABLET (40 MG TOTAL) BY MOUTH AT BEDTIME. 10/22/15  Yes Thayer Headings, MD  doxycycline (VIBRAMYCIN) 100 MG capsule Take 1 capsule (100 mg total) by mouth 2 (two) times daily. Patient not taking: Reported on 07/05/2016 05/28/16   Hyman Bible, PA-C  levofloxacin (LEVAQUIN) 750 MG tablet Take 1 tablet (750 mg total) by mouth daily. Patient not taking: Reported on 07/05/2016 06/08/16   Gildardo Cranker, DO  traMADol (ULTRAM) 50 MG tablet Take 1 tablet (50 mg total) by mouth every 6 (six) hours as needed. Patient not taking: Reported on 07/05/2016 06/14/16   Lauree Chandler, NP    Physical Exam: Vitals:   07/05/16 1317 07/05/16 1514 07/05/16 2240 07/05/16 2323  BP: 116/85 104/86 124/72 139/72  Pulse: 102 91 115 95  Resp: 20 14 16 16   Temp: 97.9 F (36.6 C)   98.2 F (36.8 C)  TempSrc: Oral   Oral  SpO2: 100% 100% 100% 100%  Weight:    58.2 kg (128 lb 6.4 oz)  Height:    5\' 4"  (1.626 m)     General:  Appears calm and comfortable and is NAD Eyes:  PERRL, EOMI, normal lids, iris ENT:  grossly normal hearing,  lips & tongue, mmm Neck:  no LAD, masses or thyromegaly Cardiovascular:  RRR, no m/r/g. No LE edema.  Respiratory:  CTA bilaterally, no w/r/r. Normal respiratory effort. Abdomen:  soft, ntnd, NABS Skin:  Necrotic second toe of the left with apparent dry gangrene.  Well healed surgical scars from prior  PVD surgery on the left thigh and medial ankle. Musculoskeletal:  grossly normal tone BUE/BLE, good ROM, no bony abnormality.  There is diminished capillary refill in all of the toes on the left foot, it is cold to touch, and there are no palpable pulses.  It is tender to palpation along the toes and the foot itself. Psychiatric:  grossly normal mood and affect, speech fluent and appropriate, AOx3 Neurologic:  CN 2-12 grossly intact, moves all extremities in coordinated fashion, sensation intact  Labs on Admission: I have personally reviewed following labs and imaging studies  CBC:  Recent Labs Lab 07/05/16 2015  WBC 9.4  NEUTROABS 6.2  HGB 13.0  HCT 38.0  MCV 90.3  PLT 867   Basic Metabolic Panel:  Recent Labs Lab 07/05/16 2015  NA 136  K 4.0  CL 100*  CO2 22  GLUCOSE 112*  BUN 28*  CREATININE 1.80*  CALCIUM 10.1   GFR: Estimated Creatinine Clearance: 27.3 mL/min (by C-G formula based on SCr of 1.8 mg/dL (H)). Liver Function Tests: No results for input(s): AST, ALT, ALKPHOS, BILITOT, PROT, ALBUMIN in the last 168 hours. No results for input(s): LIPASE, AMYLASE in the last 168 hours. No results for input(s): AMMONIA in the last 168 hours. Coagulation Profile:  Recent Labs Lab 07/05/16 2015  INR 1.07   Cardiac Enzymes: No results for input(s): CKTOTAL, CKMB, CKMBINDEX, TROPONINI in the last 168 hours. BNP (last 3 results) No results for input(s): PROBNP in the last 8760 hours. HbA1C: No results for input(s): HGBA1C in the last 72 hours. CBG: No results for input(s): GLUCAP in the last 168 hours. Lipid Profile: No results for input(s): CHOL, HDL, LDLCALC, TRIG,  CHOLHDL, LDLDIRECT in the last 72 hours. Thyroid Function Tests: No results for input(s): TSH, T4TOTAL, FREET4, T3FREE, THYROIDAB in the last 72 hours. Anemia Panel: No results for input(s): VITAMINB12, FOLATE, FERRITIN, TIBC, IRON, RETICCTPCT in the last 72 hours. Urine analysis:    Component Value Date/Time   COLORURINE YELLOW 06/08/2016 1008   APPEARANCEUR CLEAR 06/08/2016 1008   LABSPEC 1.014 06/08/2016 1008   PHURINE 5.0 06/08/2016 1008   GLUCOSEU NEGATIVE 06/08/2016 1008   HGBUR NEGATIVE 06/08/2016 1008   BILIRUBINUR NEGATIVE 06/08/2016 1008   KETONESUR NEGATIVE 06/08/2016 1008   PROTEINUR NEGATIVE 06/08/2016 1008   UROBILINOGEN 0.2 06/21/2008 2147   NITRITE NEGATIVE 06/08/2016 1008   LEUKOCYTESUR NEGATIVE 06/08/2016 1008    Creatinine Clearance: Estimated Creatinine Clearance: 27.3 mL/min (by C-G formula based on SCr of 1.8 mg/dL (H)).  Sepsis Labs: @LABRCNTIP (procalcitonin:4,lacticidven:4) )No results found for this or any previous visit (from the past 240 hour(s)).   Radiological Exams on Admission: Dg Foot Complete Left  Result Date: 07/05/2016 CLINICAL DATA:  Necrotic wound on second toe left foot. EXAM: LEFT FOOT - COMPLETE 3+ VIEW COMPARISON:  Left foot radiograph 05/28/2016 FINDINGS: The bones are osteopenic. There is no fracture or dislocation. Joint spaces are maintained. There is again seen a soft tissue wound of the distal second toe. No focal osteolysis is identified. IMPRESSION: No radiographic evidence of osteomyelitis. Soft tissue wound of the distal aspect of the left second toe. Electronically Signed   By: Ulyses Jarred M.D.   On: 07/05/2016 19:57    EKG: Not done  Assessment/Plan Principal Problem:   Dry gangrene (HCC) Active Problems:   Hypertension   Hyperlipidemia   Tobacco use disorder   Ischemic pain of left foot   PVD (peripheral vascular disease) (HCC)   Hyperglycemia   Acute  kidney injury superimposed on chronic kidney disease ([place])     Dry gangrene with PVD/ischemic pain of the left foot -Patient with apparent gangrene of the left 2nd toe -Left foot is cold, no palpable pulses, ABIs appear to indicate markedly diminished flow to the affected extremity -Vascular surgery consulted by ER and will see patient in AM -It seems extremely likely that she will lose the toe if not the whole foot -Morphine as needed for pain -Diabetic foot order set utilized, but without antibiotics since there is no current evidence of infection -Likely to also need orthopedics consult  HTN -Home meds include Clonidine, HCTZ, Lisinopril -Holding HCTZ and Lisinopril due to renal dysfunction -Will give prn IV Hydralazine in case of poor BP control  HLD -Recent lipid profile (06/08/16) shows LDL 32, HDL 53 -On simvastatin 40 mg daily  Tobacco dependence -Encourage cessation.  This was discussed with the patient and should be reviewed on an ongoing basis.   -Patch ordered.  AKI on CKD -Recent creatinine (06/08/16) was 1.52 - suspect that this is at/near her current baseline -Current creatinine is 1.80 -Hold HCTZ and ACE -Will give gentle IVF hydration and follow  Hyperglycemia -Mild on all BMPs since 2013 except once -No apparent A1c -Will order   DVT prophylaxis: Lovenox  Code Status: Full - confirmed with patient/family Family Communication: 2 family members present throughout evaluation Disposition Plan: To be determined Consults called: Vascular surgery Admission status: Admit - It is my clinical opinion that admission to INPATIENT is reasonable and necessary because this patient will require at least 2 midnights in the hospital to treat this condition based on the medical complexity of the problems presented.  Given the aforementioned information, the predictability of an adverse outcome is felt to be significant.    Karmen Bongo MD [facility]  If 7PM-7AM, please contact night-coverage [email or web address] Password  [email or web address]  07/06/2016, 12:39 AM

## 2016-07-05 NOTE — ED Notes (Signed)
Unable to palpate pulses in left foot with doppler; NP aware

## 2016-07-05 NOTE — Progress Notes (Addendum)
VASCULAR LAB PRELIMINARY  ARTERIAL  ABI completed:    RIGHT    LEFT    PRESSURE WAVEFORM  PRESSURE WAVEFORM  BRACHIAL 150 Triphasic BRACHIAL 153 Triphasic  DP 69 Severely dampened monophasic flow DP  No discernable waveform  AT   AT    PT  No discernable waveform PT  No discernable waveform  PER   PER    GREAT TOE  NA GREAT TOE  No discernable waveform    RIGHT LEFT  ABI 0.45 Unable to calculate due to absent pulses    The right ABI is suggestive of severe arterial insufficiency. Unable to calculate the left ABI due to absent pulses. The right TBI is abnormal. Unable to calculate the left TBI due to absent pulses.  Preliminary results discussed with Etta Quill.  07/05/2016 6:56 PM Maudry Mayhew, BS, RVT, RDCS, RDMS

## 2016-07-06 DIAGNOSIS — N179 Acute kidney failure, unspecified: Secondary | ICD-10-CM | POA: Diagnosis present

## 2016-07-06 DIAGNOSIS — M79672 Pain in left foot: Secondary | ICD-10-CM

## 2016-07-06 DIAGNOSIS — I96 Gangrene, not elsewhere classified: Secondary | ICD-10-CM | POA: Diagnosis present

## 2016-07-06 DIAGNOSIS — I70222 Atherosclerosis of native arteries of extremities with rest pain, left leg: Secondary | ICD-10-CM

## 2016-07-06 DIAGNOSIS — I999 Unspecified disorder of circulatory system: Secondary | ICD-10-CM

## 2016-07-06 DIAGNOSIS — R739 Hyperglycemia, unspecified: Secondary | ICD-10-CM | POA: Diagnosis present

## 2016-07-06 DIAGNOSIS — E43 Unspecified severe protein-calorie malnutrition: Secondary | ICD-10-CM | POA: Insufficient documentation

## 2016-07-06 DIAGNOSIS — N189 Chronic kidney disease, unspecified: Secondary | ICD-10-CM | POA: Diagnosis present

## 2016-07-06 DIAGNOSIS — I739 Peripheral vascular disease, unspecified: Secondary | ICD-10-CM | POA: Insufficient documentation

## 2016-07-06 LAB — BASIC METABOLIC PANEL
Anion gap: 10 (ref 5–15)
BUN: 25 mg/dL — AB (ref 6–20)
CALCIUM: 9.2 mg/dL (ref 8.9–10.3)
CHLORIDE: 104 mmol/L (ref 101–111)
CO2: 21 mmol/L — AB (ref 22–32)
CREATININE: 1.47 mg/dL — AB (ref 0.44–1.00)
GFR calc Af Amer: 42 mL/min — ABNORMAL LOW (ref >60)
GFR calc non Af Amer: 37 mL/min — ABNORMAL LOW (ref >60)
Glucose, Bld: 119 mg/dL — ABNORMAL HIGH (ref 65–99)
Potassium: 3.6 mmol/L (ref 3.5–5.1)
SODIUM: 135 mmol/L (ref 135–145)

## 2016-07-06 LAB — CBC
HCT: 31.9 % — ABNORMAL LOW (ref 36.0–46.0)
Hemoglobin: 11 g/dL — ABNORMAL LOW (ref 12.0–15.0)
MCH: 30.6 pg (ref 26.0–34.0)
MCHC: 34.5 g/dL (ref 30.0–36.0)
MCV: 88.9 fL (ref 78.0–100.0)
PLATELETS: 283 10*3/uL (ref 150–400)
RBC: 3.59 MIL/uL — ABNORMAL LOW (ref 3.87–5.11)
RDW: 12.8 % (ref 11.5–15.5)
WBC: 9.3 10*3/uL (ref 4.0–10.5)

## 2016-07-06 MED ORDER — HYDRALAZINE HCL 20 MG/ML IJ SOLN: 10.0000 mg | INTRAMUSCULAR | Status: DC | PRN

## 2016-07-06 MED ORDER — PRO-STAT SUGAR FREE PO LIQD
30.0000 mL | Freq: Two times a day (BID) | ORAL | Status: DC
  Administered 2016-07-06 – 2016-07-13 (×13): 30 mL via ORAL
  Filled 2016-07-06 (×13): qty 30

## 2016-07-06 MED ORDER — NICOTINE 7 MG/24HR TD PT24
7.0000 mg | MEDICATED_PATCH | Freq: Every day | TRANSDERMAL | Status: DC
  Administered 2016-07-06 – 2016-07-20 (×14): 7 mg via TRANSDERMAL
  Filled 2016-07-06 (×14): qty 1

## 2016-07-06 MED ORDER — BOOST / RESOURCE BREEZE PO LIQD
1.0000 | Freq: Three times a day (TID) | ORAL | Status: DC
  Administered 2016-07-06 – 2016-07-13 (×17): 1 via ORAL

## 2016-07-06 NOTE — Progress Notes (Signed)
Patient seen and examined. Admitted after midnight secondary to non-healing wound of her left 2nd toe and signs of gangrene now. Patient with stable vitals and no signs of infection. Had hx of bypass surgery in the leg. Please refer to H&P written by Dr. Lorin Mercy for further info/details on admission.  Plan: -will follow vascular surgery recommendations  -will need amputation (not clear yet if just the toe, or metatarsal amputation) -continue supportive care  Barton Dubois [phone]

## 2016-07-06 NOTE — Consult Note (Signed)
Hospital Consult    Reason for Consult:  necrotic left 2nd toe Referring Physician:  Lorin Mercy MRN #:  [id number]  History of Present Illness: This is a 65 y.o. female who presented to the ED yesterday with complaints of continued non-healing wound of her left 2nd toe.  She had hot grease splash on her left 2nd toe in early September.  She was seen at [facility] and was diagnosed with a paronychia with possible abscess.  Her xray was negative for osteomyelitis and she was give Doxycycline for 10 days.  It is reported that a doppler signal was obtained with the doppler and sensation was noted to be in tact.    Pt tells me that the pain in her foot started on 05/28/16 after they pulled the nail bed off.  She states that prior to this, she did not have any claudication symptoms or pain in her foot.  At this time, she says hanging her foot over the side of the bed makes her pain feel better.  She does not like the sheets to touch her foot.  She says that she still has weakness in her left leg and compensates with her right leg.  She states that she has weakness in her left hand as well.  She states she did have some "swelling" in the toe, but after the antibiotics, it did get better.  She states that she hasn't been able to walk for a couple of weeks due to the pain.   On 06/08/16, she established care with Dr. Eulas Post.  At that time, she was diagnosed with cellulitis of the left 2nd toe and given Levaquin 750mg  x 10d.  A wound cx was obtained.  Multiple organisms were present-none dominant & she was continued on Abx.  The gram stain revealed abundant GNR and moderate GPC in pairs.   Her pain and the wound has worsened over the past couple of weeks and now she can hardly walk.  In the ER, she had ABI's and the results are listed below.   She takes a daily aspirin.  She is on clonidine, ACEI, and HCTZ for blood pressure management.  She is on a statin for cholesterol management.  She is on Lovenox for DVT  prophylaxis while in the hospital.  She does have a hx of right thalamic stroke in 2008.  She continues to smoke.   She has a hx of left CFA to below knee popliteal artery bypass grafting with 75mm ringed propaten graft by Dr. Trula Slade on 05/30/08.   She had this done for a left leg ulcer that was present for ~ 6 months. She was seen again on 10/13/08 and her wound had nearly healed. As far as I can tell, this was her last visit with [facility].  Past Medical History:  Diagnosis Date  . Hyperlipidemia   . Hypertension   . Peripheral vascular disease ([place])    vein surgery, left leg  . Stroke [facility]) 2002   ongoing mild loss of feeling in left side, had to learn to write right-handed  . Tobacco abuse     Past Surgical History:  Procedure Laterality Date  . VEIN SURGERY Left     Allergies  Allergen Reactions  . Penicillins Swelling    Has patient had a PCN reaction causing immediate rash, facial/tongue/throat swelling, SOB or lightheadedness with hypotension: YES Has patient had a PCN reaction causing severe rash involving mucus membranes or skin necrosis: NO Has patient had a PCN reaction  that required hospitalization NO Has patient had a PCN reaction occurring within the last 10 years: NO If all of the above answers are "NO", then may proceed with Cephalosporin use.    Prior to Admission medications   Medication Sig Start Date End Date Taking? Authorizing Provider  aspirin EC 81 MG tablet TAKE 1 TABLET (325 MG TOTAL) BY MOUTH DAILY. 10/22/15  Yes Thayer Headings, MD  cloNIDine (CATAPRES) 0.2 MG tablet Take 1 tablet (0.2 mg total) by mouth 2 (two) times daily. 10/22/15  Yes Thayer Headings, MD  hydrochlorothiazide (HYDRODIURIL) 25 MG tablet Take 0.5 tablets (12.5 mg total) by mouth daily. Patient taking differently: Take 12.5 mg by mouth every other day.  10/22/15  Yes Thayer Headings, MD  lisinopril (PRINIVIL,ZESTRIL) 40 MG tablet Take 1 tablet (40 mg total) by mouth daily. 10/22/15  Yes Thayer Headings, MD  potassium chloride (MICRO-K) 10 MEQ CR capsule TAKE 1 TABLET (10 MEQ TOTAL) BY MOUTH DAILY. 05/03/16  Yes Thayer Headings, MD  saccharomyces boulardii (FLORASTOR) 250 MG capsule Take 1 capsule (250 mg total) by mouth 2 (two) times daily. 06/08/16  Yes Monica Eulas Post, DO  simvastatin (ZOCOR) 40 MG tablet TAKE 1 TABLET (40 MG TOTAL) BY MOUTH AT BEDTIME. 10/22/15  Yes Thayer Headings, MD    Social History   Social History  . Marital status: Widowed    Spouse name: N/A  . Number of children: N/A  . Years of education: N/A   Occupational History  . retired    Social History Main Topics  . Smoking status: Current Some Day Smoker    Years: 25.00    Types: Cigarettes  . Smokeless tobacco: Never Used     Comment: currently down to a few cigarettes per day  . Alcohol use Yes     Comment: every now and then, last drink was maybe in July  . Drug use:     Types: Marijuana  . Sexual activity: Not on file   Other Topics Concern  . Not on file   Social History Narrative   DIET: None      DO YOU DRINK/EAT THINGS WITH CAFFEINE: no      MARITAL STATUS: Widow      WHAT YEAR WERE YOU MARRIED: 1990      DO YOU LIVE IN A HOUSE, APARTMENT, ASSISTED LIVING, CONDO TRAILER ETC.:  House      IS IT ONE OR MORE STORIES: One      HOW MANY PERSONS LIVE IN YOUR HOME: 3      DO YOU HAVE PETS IN YOUR HOME: no      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      DO YOU EXERCISE: sometimes      WHAT TYPE AND HOW OFTEN: once a week     Family History  Problem Relation Age of Onset  . Hypertension Mother 6    ROS: [x]  Positive   [ ]  Negative   [ ]  All sytems reviewed and are negative  Cardiovascular: []  chest pain/pressure []  palpitations []  SOB lying flat []  DOE []  pain in legs while walking []  pain in legs at rest []  pain in legs at night [x]  non-healing ulcers []  hx of DVT []  swelling in legs  Pulmonary: []  productive cough []  asthma/wheezing []  home O2  Neurologic: [x]   weakness in left arm and leg []  numbness in []  arms []  legs [x]  hx of CVA with left sided weakness []  mini  stroke [] difficulty speaking or slurred speech []  temporary loss of vision in one eye []  dizziness  Hematologic: []  hx of cancer []  bleeding problems []  problems with blood clotting easily  Endocrine:   []  diabetes []  thyroid disease  GI []  vomiting blood []  blood in stool  GU: []  CKD/renal failure []  HD--[]  M/W/F or []  T/T/S []  burning with urination []  blood in urine  Psychiatric: []  anxiety []  depression  Musculoskeletal: []  arthritis []  joint pain  Integumentary: []  rashes []  ulcers  Constitutional: []  fever []  chills   Physical Examination  Vitals:   07/05/16 2323 07/06/16 0845  BP: 139/72 100/67  Pulse: 95 83  Resp: 16 16  Temp: 98.2 F (36.8 C) 98.5 F (36.9 C)   Body mass index is 22.04 kg/m.  General:  WDWN in NAD Gait: Not observed HENT: WNL, normocephalic Pulmonary: normal non-labored breathing, without Rales, rhonchi,  wheezing Cardiac: regular, without  Murmurs, rubs or gallops; without carotid bruits Abdomen:  soft, NT/ND, no masses Skin: without rashes Vascular Exam/Pulses:  Right Left  Femoral Monophasic doppler signal Faint doppler signal  Popliteal Unable to palpate  Unable to palpate   DP Monophasic doppler signal absent  PT absent absent  Peroneal absent absent   Extremities: left 2nd toe with necrotic tip-no drainage.  Well healed medial wound just proximal to malleolus Musculoskeletal: no muscle wasting or atrophy  Neurologic: A&O X 3; 5/5 RUE; 4/5 LUE; Speech is fluent/normal Psychiatric:  Normal affect Lymph:  No inguinal lymphadenopathy   CBC    Component Value Date/Time   WBC 9.3 07/06/2016 0639   RBC 3.59 (L) 07/06/2016 0639   HGB 11.0 (L) 07/06/2016 0639   HCT 31.9 (L) 07/06/2016 0639   PLT 283 07/06/2016 0639   MCV 88.9 07/06/2016 0639   MCH 30.6 07/06/2016 0639   MCHC 34.5 07/06/2016 0639   RDW  12.8 07/06/2016 0639   LYMPHSABS 2.2 07/05/2016 2015   MONOABS 0.7 07/05/2016 2015   EOSABS 0.3 07/05/2016 2015   BASOSABS 0.0 07/05/2016 2015    BMET    Component Value Date/Time   NA 135 07/06/2016 0639   K 3.6 07/06/2016 0639   CL 104 07/06/2016 0639   CO2 21 (L) 07/06/2016 0639   GLUCOSE 119 (H) 07/06/2016 0639   BUN 25 (H) 07/06/2016 0639   CREATININE 1.47 (H) 07/06/2016 0639   CREATININE 1.52 (H) 06/08/2016 1008   CALCIUM 9.2 07/06/2016 0639   GFRNONAA 37 (L) 07/06/2016 0639   GFRNONAA 36 (L) 06/08/2016 1008   GFRAA 42 (L) 07/06/2016 0639   GFRAA 41 (L) 06/08/2016 1008    COAGS: Lab Results  Component Value Date   INR 1.07 07/05/2016   INR 1.0 05/29/2008     Non-Invasive Vascular Imaging:   ABI's 07/05/16  RIGHT   LEFT    PRESSURE WAVEFORM  PRESSURE WAVEFORM  BRACHIAL 150 Triphasic BRACHIAL 153 Triphasic  DP 69 Severely dampened monophasic flow DP  No discernable waveform  AT   AT    PT  No discernable waveform PT  No discernable waveform  PER   PER    GREAT TOE  NA GREAT TOE  No discernable waveform    RIGHT LEFT  ABI 0.45 Unable to calculate due to absent pulses     Statin:  Yes.   Beta Blocker:  No. Aspirin:  Yes.   ACEI:  Yes.   ARB:  No. Other antiplatelets/anticoagulants:  No. (Lovenox in hospital for DVT prophylaxis)   ASSESSMENT/PLAN:  This is a 65 y.o. female with non healing wound left 2nd toe present since September & hx of left femoral to below knee popliteal bypass with propaten in 2009 by Dr. Trula Slade.   -pt with absent doppler signals left foot.  She does have a faint doppler signal left femoral artery.  Her bypass has most likely occluded, but unable to tell when by speaking with her.   -she will need an aortogram with BLE runoff and possible intervention--time to be determined by Dr. Trula Slade, but most likely tomorrow.  Will pre op her.  Her creatinine today is 1.47 and was 1.80 yesterday. -I discussed the  importance of smoking cessation with the pt and she expressed understanding. -Dr. Trula Slade will be by to see the pt later today.   Leontine Locket, PA-C [facility] [phone]   I agree with the above. I have seen and evaluated the patient.  She has a limb threatening ischemic process to the left leg which has been going on for several weeks.  Will plan for angiography on Friday to evaluate revascularization options.  Will limit IV dye given recent increase in creatinine   Wells Greggory Safranek

## 2016-07-06 NOTE — Progress Notes (Signed)
Initial Nutrition Assessment  DOCUMENTATION CODES:   Severe malnutrition in context of acute illness/injury  INTERVENTION:  Provide Boost Breeze po TID, each supplement provides 250 kcal and 9 grams of protein.  Provide 30 ml Prostat po BID, each supplement provides 100 kcal and 15 grams of protein.   Encourage adequate PO intake.   NUTRITION DIAGNOSIS:   Malnutrition related to acute illness as evidenced by percent weight loss, moderate depletion of body fat, moderate depletions of muscle mass.  GOAL:   Patient will meet greater than or equal to 90% of their needs  MONITOR:   PO intake, Supplement acceptance, Labs, Weight trends, Skin, I & O's  REASON FOR ASSESSMENT:   Consult Wound healing  ASSESSMENT:   64 y.o. female with medical history significant of CVA, PVD, HTN, HLD, tobacco abuse presenting with a necrotic toe.  She splashed hot grease on her left second toe in early September. Now can hardly walk on foot.  It has worsened over the last 2 weeks.  Pt reports having a decreased appetite which has been ongoing over the past 3 weeks. Pt reports consuming at least 3 meals a day, however says meals have been smaller in portion. Current meal completion has been 25%. Pt reports weight loss with weight of ~151 lbs 3 weeks ago. Per Epic weight records, pt with a 10.5% weight loss in 1 month. Pt is agreeable to nutritional supplements to aid in caloric and protein needs as well as in wound healing. RD to order. Pt does report however that milk products cause abdominal pains and gas. RD to order Boost Breeze and Prostat. Pt encouraged to eat her foods at meals.   Nutrition-Focused physical exam completed. Findings are mild to moderate fat depletion, moderate muscle depletion, and mild edema.   Labs and medications reviewed.   Diet Order:  Diet Heart Room service appropriate? Yes; Fluid consistency: Thin  Skin:  Wound (see comment) (necrotic L second toe)  Last BM:   Unknown  Height:   Ht Readings from Last 1 Encounters:  07/05/16 5\' 4"  (1.626 m)    Weight:   Wt Readings from Last 1 Encounters:  07/05/16 128 lb 6.4 oz (58.2 kg)    Ideal Body Weight:  54.5 kg  BMI:  Body mass index is 22.04 kg/m.  Estimated Nutritional Needs:   Kcal:  1750-2000  Protein:  75-90 grams  Fluid:  1.8 - 2 L/day  EDUCATION NEEDS:   No education needs identified at this time  Corrin Parker, MS, RD, LDN Pager # [phone] After hours/ weekend pager # [phone]

## 2016-07-07 DIAGNOSIS — E785 Hyperlipidemia, unspecified: Secondary | ICD-10-CM

## 2016-07-07 DIAGNOSIS — R739 Hyperglycemia, unspecified: Secondary | ICD-10-CM

## 2016-07-07 LAB — CBC
HCT: 32.5 % — ABNORMAL LOW (ref 36.0–46.0)
HEMOGLOBIN: 11 g/dL — AB (ref 12.0–15.0)
MCH: 30.3 pg (ref 26.0–34.0)
MCHC: 33.8 g/dL (ref 30.0–36.0)
MCV: 89.5 fL (ref 78.0–100.0)
Platelets: 288 10*3/uL (ref 150–400)
RBC: 3.63 MIL/uL — ABNORMAL LOW (ref 3.87–5.11)
RDW: 12.8 % (ref 11.5–15.5)
WBC: 7.4 10*3/uL (ref 4.0–10.5)

## 2016-07-07 LAB — BASIC METABOLIC PANEL
ANION GAP: 10 (ref 5–15)
BUN: 23 mg/dL — ABNORMAL HIGH (ref 6–20)
CALCIUM: 9.2 mg/dL (ref 8.9–10.3)
CO2: 25 mmol/L (ref 22–32)
Chloride: 104 mmol/L (ref 101–111)
Creatinine, Ser: 1.17 mg/dL — ABNORMAL HIGH (ref 0.44–1.00)
GFR calc Af Amer: 56 mL/min — ABNORMAL LOW (ref >60)
GFR calc non Af Amer: 48 mL/min — ABNORMAL LOW (ref >60)
GLUCOSE: 95 mg/dL (ref 65–99)
Potassium: 3.9 mmol/L (ref 3.5–5.1)
Sodium: 139 mmol/L (ref 135–145)

## 2016-07-07 LAB — PROTIME-INR
INR: 1.15
PROTHROMBIN TIME: 14.8 s (ref 11.4–15.2)

## 2016-07-07 NOTE — Consult Note (Signed)
           [facility] CM Primary Care Navigator  07/07/2016  Wendy Townsend Dec 03, 1950 [id number]   Patient seen at the bedside to evaluate discharge needs. Patient shared that she has non-healing wound to left 2nd toe with increased/worsening pain that makes her not able to walk much and made family convince her to be brought in, which had led to this admission. She denies being diabetic but admits to smoking. Smoking cessation discussed with patient due to its effects with wound healing. Patient verbalized awareness and states will try to quit again this time. Patient verbalized of hoping to go home when discharged.  Patient confirms that primary care provider is Gildardo Cranker, DO at [facility]. Transportation to doctors' appointments had been provided by niece Enis Slipper), nephew Beverely Low) or [employer] transport company. Patient was made aware of her transportation benefits with [employer].   Patient states using [facility] to obtain medications without difficulty. She mentioned being assisted by her niece with medication management at home and checks behind her to make sure it is right as stated.  She verbalized that her niece and cousin Hal Hope) will be the primary caregivers at home. Patient also states having good family support.  Patient had expressed understanding to call primary care provider's office once discharged, for a post discharge follow-up appointment within a week or sooner if needs arise.  Patient letter provided as a reminder.  Patient denies any further needs or concerns at this time.  For questions, please contact:  Dannielle Huh, BSN, RN- [facility] Primary Care Navigator  Telephone: [phone] [place]

## 2016-07-07 NOTE — Progress Notes (Addendum)
[facility] PROGRESS NOTE  Wendy Townsend EHM:[id number] DOB: [birth year] DOA: 07/05/2016 PCP: Gildardo Cranker, DO  Interim summary and HPI 65 y.o. female with medical history significant of CVA, PVD, HTN, HLD, tobacco abuse presenting with a necrotic toe.  She splashed hot grease on her left second toe in early September and was seen at [facility] on September 9.   She was diagnosed with a paronychia with possible abscess, xray negative for osteomyelitis. She was given Tramadol and Doxy x 10 days.  At that time, her foot did not feel cold and a DP pulse was found with the Doppler.  Sensation was noted to be intact.    She established care with Dr. Eulas Post on 9/20.  She was diagnosed with cellulitis of the left second toe and given Levaquin 750 mg x 10 days; a wound culture was obtained and she was referred (again) to podiatry.    Now can hardly walk on foot.  It has worsened over the last 2 weeks.  +pain.  Denies drainage.  Assessment/Plan: Dry gangrene with PVD/ischemic pain of the left foot -Patient with apparent gangrene of the left 2nd toe -Left foot cold, without palpable pulses, ABIs appear to indicate markedly diminished flow to the affected extremity. -Vascular surgery consulted; will follow rec's -It seems extremely likely that she will lose the toe if not the whole foot -continue PRN analgesic regimen  -Diabetic foot order set utilized, but without antibiotics since there is no current evidence of infection appreciated  HTN -Home meds include Clonidine, HCTZ, Lisinopril -Holding HCTZ and Lisinopril due to renal dysfunction -Will give prn IV Hydralazine in case of poor BP control -continue clonidine   HLD -Recent lipid profile (06/08/16) shows LDL 32, HDL 53 -continue Simvastatin 40 mg daily  Tobacco dependence -Encourage cessation.  This was discussed with the patient  -Patch ordered.  AKI on CKD: stage 3 at baseline  -Recent creatinine1.17 (back to baseline) -will  continue holding nephrotoxic agents (HCTZ and ACE) -Will continue gentle IVF's overnight  Hyperglycemia -A1C pending -will monitor CBG's intermittently and if > 200 will start SSI  Protein calorie malnutrition: severe -nutrition supplements as per nutritional service recommendations   Code Status: Full Family Communication: daughter at bedside  Disposition Plan: to be determine by vascular surgery; anticipate need for at least toe amputation. Per vascular note looking to assess with angiogram status of prior bypass.   Consultants:  Vascular surgery   Procedures:  See below for x-ray reports   Antibiotics:  None   HPI/Subjective: Afebrile, in no distress. Denies CP, nausea, vomiting and SOB.  Objective: Vitals:   07/07/16 0703 07/07/16 0919  BP: (!) 152/99 118/77  Pulse: 96 95  Resp: 18 17  Temp: 98.7 F (37.1 C) 97.8 F (36.6 C)    Intake/Output Summary (Last 24 hours) at 07/07/16 1717 Last data filed at 07/07/16 1416  Gross per 24 hour  Intake             1180 ml  Output             1050 ml  Net              130 ml   Filed Weights   07/05/16 2323  Weight: 58.2 kg (128 lb 6.4 oz)    Exam:   General:  Afebrile, non-toxic in appearance. Patient denies CP and SOB.  Cardiovascular: S1 and S2, no rubs, no gallops  Respiratory: CTA bilaterally   Abdomen: soft, NT, ND, positive BS  Musculoskeletal: no edema, positive left 2nd toe gangrene appreciated; decrease pedal pulses bilaterally (L > R)  Data Reviewed: Basic Metabolic Panel:  Recent Labs Lab 07/05/16 2015 07/06/16 0639 07/07/16 0531  NA 136 135 139  K 4.0 3.6 3.9  CL 100* 104 104  CO2 22 21* 25  GLUCOSE 112* 119* 95  BUN 28* 25* 23*  CREATININE 1.80* 1.47* 1.17*  CALCIUM 10.1 9.2 9.2   CBC:  Recent Labs Lab 07/05/16 2015 07/06/16 0639 07/07/16 0531  WBC 9.4 9.3 7.4  NEUTROABS 6.2  --   --   HGB 13.0 11.0* 11.0*  HCT 38.0 31.9* 32.5*  MCV 90.3 88.9 89.5  PLT 315 283 288    CBG: No results for input(s): GLUCAP in the last 168 hours.  No results found for this or any previous visit (from the past 240 hour(s)).   Studies: Dg Foot Complete Left  Result Date: 07/05/2016 CLINICAL DATA:  Necrotic wound on second toe left foot. EXAM: LEFT FOOT - COMPLETE 3+ VIEW COMPARISON:  Left foot radiograph 05/28/2016 FINDINGS: The bones are osteopenic. There is no fracture or dislocation. Joint spaces are maintained. There is again seen a soft tissue wound of the distal second toe. No focal osteolysis is identified. IMPRESSION: No radiographic evidence of osteomyelitis. Soft tissue wound of the distal aspect of the left second toe. Electronically Signed   By: Ulyses Jarred M.D.   On: 07/05/2016 19:57    Scheduled Meds: . aspirin EC  81 mg Oral Daily  . cloNIDine  0.2 mg Oral BID  . enoxaparin (LOVENOX) injection  30 mg Subcutaneous QHS  . feeding supplement  1 Container Oral TID BM  . feeding supplement (PRO-STAT SUGAR FREE 64)  30 mL Oral BID  . nicotine  7 mg Transdermal Daily  . saccharomyces boulardii  250 mg Oral BID  . simvastatin  40 mg Oral q1800   Continuous Infusions: . lactated ringers 50 mL/hr at 07/06/16 1956    Principal Problem:   Dry gangrene ([place]) Active Problems:   Hypertension   Hyperlipidemia   Tobacco use disorder   Ischemic pain of left foot   PVD (peripheral vascular disease) (HCC)   Hyperglycemia   Acute kidney injury superimposed on chronic kidney disease ([place])   Protein-calorie malnutrition, severe    Time spent: 25 minutes    Barton Dubois  [facility] Pager [phone]. If 7PM-7AM, please contact night-coverage at [email or web address], password [facility] 07/07/2016, 5:17 PM  LOS: 2 days

## 2016-07-08 ENCOUNTER — Encounter (HOSPITAL_COMMUNITY)
Admission: EM | Disposition: A | Discharge status: Rehabilitation Facility | Payer: Self-pay | Source: Home / Self Care | Attending: Internal Medicine

## 2016-07-08 HISTORY — PX: PERIPHERAL VASCULAR CATHETERIZATION: SHX172C

## 2016-07-08 LAB — BASIC METABOLIC PANEL
ANION GAP: 8 (ref 5–15)
BUN: 19 mg/dL (ref 6–20)
CO2: 24 mmol/L (ref 22–32)
Calcium: 9 mg/dL (ref 8.9–10.3)
Chloride: 106 mmol/L (ref 101–111)
Creatinine, Ser: 1.05 mg/dL — ABNORMAL HIGH (ref 0.44–1.00)
GFR, EST NON AFRICAN AMERICAN: 55 mL/min — AB (ref >60)
Glucose, Bld: 108 mg/dL — ABNORMAL HIGH (ref 65–99)
POTASSIUM: 3.5 mmol/L (ref 3.5–5.1)
SODIUM: 138 mmol/L (ref 135–145)

## 2016-07-08 LAB — HEMOGLOBIN A1C
Hgb A1c MFr Bld: 6.2 % — ABNORMAL HIGH (ref 4.8–5.6)
MEAN PLASMA GLUCOSE: 131 mg/dL

## 2016-07-08 SURGERY — ABDOMINAL AORTOGRAM W/LOWER EXTREMITY

## 2016-07-08 MED ORDER — HYDRALAZINE HCL 20 MG/ML IJ SOLN
5.0000 mg | INTRAMUSCULAR | Status: DC | PRN
Start: 2016-07-08 — End: 2016-07-14

## 2016-07-08 MED ORDER — LABETALOL HCL 5 MG/ML IV SOLN
10.0000 mg | INTRAVENOUS | Status: DC | PRN
Start: 2016-07-08 — End: 2016-07-14
  Administered 2016-07-15: 10 mg via INTRAVENOUS

## 2016-07-08 MED ORDER — SODIUM CHLORIDE 0.9 % IV SOLN
INTRAVENOUS | Status: DC
  Administered 2016-07-08: 06:00:00 via INTRAVENOUS

## 2016-07-08 MED ORDER — METOPROLOL TARTRATE 5 MG/5ML IV SOLN: 2.0000 mg | INTRAVENOUS | Status: DC | PRN

## 2016-07-08 MED ORDER — SODIUM CHLORIDE 0.45 % IV SOLN
INTRAVENOUS | Status: DC
  Administered 2016-07-08 – 2016-07-09 (×2): via INTRAVENOUS

## 2016-07-08 MED ORDER — HEPARIN (PORCINE) IN NACL 2-0.9 UNIT/ML-% IJ SOLN
INTRAMUSCULAR | Status: AC
  Filled 2016-07-08: qty 1000

## 2016-07-08 MED ORDER — LIDOCAINE HCL (PF) 1 % IJ SOLN
INTRAMUSCULAR | Status: AC
  Filled 2016-07-08: qty 30

## 2016-07-08 MED ORDER — IODIXANOL 320 MG/ML IV SOLN
INTRAVENOUS | Status: DC | PRN
  Administered 2016-07-08: 190 mL

## 2016-07-08 MED ORDER — ONDANSETRON HCL 4 MG/2ML IJ SOLN: 4.0000 mg | Freq: Four times a day (QID) | INTRAMUSCULAR | Status: DC | PRN

## 2016-07-08 MED ORDER — LIDOCAINE HCL (PF) 1 % IJ SOLN
INTRAMUSCULAR | Status: DC | PRN
  Administered 2016-07-08: 30 mL

## 2016-07-08 MED ORDER — HEPARIN (PORCINE) IN NACL 2-0.9 UNIT/ML-% IJ SOLN
INTRAMUSCULAR | Status: DC | PRN
  Administered 2016-07-08: 1000 mL

## 2016-07-08 MED ORDER — ENOXAPARIN SODIUM 40 MG/0.4ML ~~LOC~~ SOLN
40.0000 mg | Freq: Every day | SUBCUTANEOUS | Status: DC
  Administered 2016-07-08 – 2016-07-13 (×6): 40 mg via SUBCUTANEOUS
  Filled 2016-07-08 (×7): qty 0.4

## 2016-07-08 SURGICAL SUPPLY — 14 items
CATH ANGIO 5F PIGTAIL 100CM (CATHETERS) ×1 IMPLANT
COVER PRB 48X5XTLSCP FOLD TPE (BAG) IMPLANT
COVER PROBE 5X48 (BAG) ×4
KIT MICROINTRODUCER STIFF 5F (SHEATH) ×1 IMPLANT
KIT PV (KITS) ×2 IMPLANT
SHEATH PINNACLE 5F 10CM (SHEATH) ×1 IMPLANT
STOPCOCK MORSE 400PSI 3WAY (MISCELLANEOUS) ×1 IMPLANT
SYR MEDRAD MARK V 150ML (SYRINGE) ×2 IMPLANT
TRANSDUCER W/STOPCOCK (MISCELLANEOUS) ×2 IMPLANT
TRAY PV CATH (CUSTOM PROCEDURE TRAY) ×2 IMPLANT
TUBING CIL FLEX 10 FLL-RA (TUBING) ×1 IMPLANT
TUBING HIGH PRESSURE 120CM (CONNECTOR) ×1 IMPLANT
WIRE HITORQ VERSACORE ST 145CM (WIRE) ×1 IMPLANT
WIRE TORQFLEX AUST .018X40CM (WIRE) ×2 IMPLANT

## 2016-07-08 NOTE — Progress Notes (Addendum)
Site area: Rt brachial Site Prior to Removal:  Level 0 Pressure Applied For:20 min Manual:yes    Patient Status During Pull:  stable Post Pull Site:  Level 0 Post Pull Instructions Given:  yes Post Pull Pulses Present: palpable Dressing Applied:  tegaderm Bedrest begins @ 1500 Comments:by [employer]

## 2016-07-08 NOTE — Progress Notes (Signed)
Reviewed angiogram today.  Patient will likely need aorto-bi-femoral bypass and re-do left femoral popliteal bypass for limb salvage. Please get formal cardiac clearance.  Continue ASA.  No need for additional anti-platelet meds.  Will discuss with patient tomorrow  Wendy Townsend

## 2016-07-08 NOTE — Progress Notes (Signed)
[facility] PROGRESS NOTE  Wendy Townsend DXA:[id number] DOB: 06/12/1951 DOA: 07/05/2016 PCP: Wendy Cranker, DO  Interim summary and HPI 65 y.o. female with medical history significant of CVA, PVD, HTN, HLD, tobacco abuse presenting with a necrotic toe.  She splashed hot grease on her left second toe in early September and was seen at [facility] on September 9.   She was diagnosed with a paronychia with possible abscess, xray negative for osteomyelitis. She was given Tramadol and Doxy x 10 days.  At that time, her foot did not feel cold and a DP pulse was found with the Doppler.  Sensation was noted to be intact.    She established care with Dr. Eulas Townsend on 9/20.  She was diagnosed with cellulitis of the left second toe and given Levaquin 750 mg x 10 days; a wound culture was obtained and she was referred (again) to podiatry.    Now can hardly walk on foot.  It has worsened over the last 2 weeks.  +pain.  Denies drainage.  Assessment/Plan: Dry gangrene with PVD/ischemic pain of the left foot -Patient with apparent gangrene of the left 2nd toe -Left foot cold, without palpable pulses, ABIs appear to indicate markedly diminished flow to the affected extremity. -Vascular surgery consulted; will follow rec's; looking for arteriogram later today -It seems extremely likely that she will lose the toe if not the whole foot -continue PRN analgesic regimen  -Diabetic foot order set utilized, but without antibiotics since there is no current evidence of infection appreciated  HTN -Home meds include Clonidine, HCTZ, Lisinopril -Holding HCTZ and Lisinopril due to renal dysfunction -Will give prn IV Hydralazine in case of poor BP control -continue clonidine   HLD -Recent lipid profile (06/08/16) shows LDL 32, HDL 53 -continue Simvastatin 40 mg daily  Tobacco dependence -Encourage cessation.  This was discussed with the patient  -Patch ordered.  AKI on CKD: stage 3 at baseline  -Recent  creatinine1.17 (back to baseline) -will continue holding nephrotoxic agents (HCTZ and ACE) -Will continue gentle IVF's overnight  Hyperglycemia -A1C pending -will monitor CBG's intermittently and if > 200 will start SSI  Protein calorie malnutrition: severe -nutrition supplements as per nutritional service recommendations   Code Status: Full Family Communication: daughter at bedside  Disposition Plan: to be determine by vascular surgery; anticipate need for at least toe amputation. Per vascular note looking to assess with angiogram status of prior bypass and then determine surgical plan.   Consultants:  Vascular surgery   Procedures:  See below for x-ray reports   Antibiotics:  None   HPI/Subjective: Afebrile, in no distress. Denies CP, nausea, vomiting and SOB. Potential arteriogram plan for today.  Objective: Vitals:   07/08/16 1418 07/08/16 1423  BP: (!) 187/93   Pulse: (!) 183 88  Resp: (!) 9 13  Temp:      Intake/Output Summary (Last 24 hours) at 07/08/16 1451 Last data filed at 07/08/16 1102  Gross per 24 hour  Intake             1440 ml  Output              600 ml  Net              840 ml   Filed Weights   07/05/16 2323  Weight: 58.2 kg (128 lb 6.4 oz)    Exam:   General:  Afebrile, non-toxic in appearance. Patient denies CP and SOB.  Cardiovascular: S1 and S2, no rubs, no gallops  Respiratory: CTA bilaterally   Abdomen: soft, NT, ND, positive BS  Musculoskeletal: no edema, positive left 2nd toe gangrene appreciated; decrease pedal pulses bilaterally (L > R)  Data Reviewed: Basic Metabolic Panel:  Recent Labs Lab 07/05/16 2015 07/06/16 0639 07/07/16 0531 07/08/16 0536  NA 136 135 139 138  K 4.0 3.6 3.9 3.5  CL 100* 104 104 106  CO2 22 21* 25 24  GLUCOSE 112* 119* 95 108*  BUN 28* 25* 23* 19  CREATININE 1.80* 1.47* 1.17* 1.05*  CALCIUM 10.1 9.2 9.2 9.0   CBC:  Recent Labs Lab 07/05/16 2015 07/06/16 0639 07/07/16 0531   WBC 9.4 9.3 7.4  NEUTROABS 6.2  --   --   HGB 13.0 11.0* 11.0*  HCT 38.0 31.9* 32.5*  MCV 90.3 88.9 89.5  PLT 315 283 288   CBG: No results for input(s): GLUCAP in the last 168 hours.  No results found for this or any previous visit (from the past 240 hour(s)).   Studies: No results found.  Scheduled Meds: . [MAR Hold] aspirin EC  81 mg Oral Daily  . [MAR Hold] cloNIDine  0.2 mg Oral BID  . [MAR Hold] enoxaparin (LOVENOX) injection  40 mg Subcutaneous QHS  . [MAR Hold] feeding supplement  1 Container Oral TID BM  . [MAR Hold] feeding supplement (PRO-STAT SUGAR FREE 64)  30 mL Oral BID  . [MAR Hold] nicotine  7 mg Transdermal Daily  . [MAR Hold] saccharomyces boulardii  250 mg Oral BID  . [MAR Hold] simvastatin  40 mg Oral q1800   Continuous Infusions: . sodium chloride 100 mL/hr at 07/08/16 1443  . sodium chloride 75 mL/hr at 07/08/16 0629  . lactated ringers 50 mL/hr at 07/06/16 1956    Principal Problem:   Dry gangrene ([name]) Active Problems:   Hypertension   Hyperlipidemia   Tobacco use disorder   Ischemic pain of left foot   PVD (peripheral vascular disease) ([name])   Hyperglycemia   Acute kidney injury superimposed on chronic kidney disease ([name])   Protein-calorie malnutrition, severe    Time spent: 25 minutes    Wendy Townsend  [facility] Pager [phone]. If 7PM-7AM, please contact night-coverage at [email or web address], password [facility] 07/08/2016, 2:51 PM  LOS: 3 days

## 2016-07-08 NOTE — Op Note (Addendum)
Procedure: Abdominal aortogram with bilateral lower extremity runoff  Preoperative diagnosis: Ischemia of foot with rest pain and tissue loss  Postoperative diagnosis: Same  Anesthesia: Local  Operative findings:  Operative details: After obtaining informed consent, patient taken to the fibula. The patient was placed in supine position the Angio table. Both groins were prepped and draped in usual sterile fashion. Local anesthesia was insured of the right common femoral artery. Ultrasound was used to identify the right common femoral artery. Local anesthesia was insured over the right common femoral artery. An introducer needle was used to cannulate the right common femoral artery and there was good pulsatile bleeding. However, the guidewire would not advance up into the iliac system. I then made several attempts with a regular introducer needle and a micropuncture needle and again the guidewire would stop in the distal external iliac artery with every attempt. At this point I aborted attempts in the right groin. Attention was then turned to the left groin. I was unable to identify a pulsatile patent femoral artery on the left side. At this point we decided to do the procedure from the left upper extremity. The patient's left upper extremity is prepped and draped in usual sterile fashion. Local anesthesia was obtained over the brachial artery near the antecubital crease. Ultrasound was used to identify the brachial artery. A micropuncture needle was used to successfully cannulate the brachial artery and the micropuncture wire threaded in the brachial system. Brachial artery was then accessed with a micropuncture sheath. An 035 versacore wire was then threaded up into the left subclavian. A 5 French sheath was placed over this. The guidewire was then advanced down the left subclavian and into the descending aorta and abdominal aorta under fluoroscopic guidance. A 5 French pig catheter was then placed over  this. Normal aortogram was then obtained in AP projection. The left and right renal arteries are patent. The infrarenal abdominal aorta is occluded. There was reconstitution of the profunda on the left side and the distal common femoral artery on the right side. I then proceeded to do lower extremity runoff used through the pig catheter.  In the right lower externa, the right distal common femoral artery is patent. The right profunda femoris artery is patent. The right superficial femoral artery is patent although disease distally. The popliteal artery is patent and there is two-vessel runoff to the right leg via the peroneal and posterior tibial arteries.  In the left lower extremity, the left common femoral is occluded. The profunda reconstitutes via pelvic collaterals. The left superficial femoral artery is occluded. The below-knee popliteal artery reconstitutes via collaterals. There is three-vessel runoff to the left foot but the distal posterior tibial and anterior tibial arteries are not well visualized. The peroneal artery is the primary runoff vessel to the left foot. All 3 of these tibial vessels are quite small. At this point the pigtail catheter was removed over a guidewire. The 5 French sheath was left in the left brachial artery to be pulled in the holding area. The patient tolerated procedure well and there were no complications. The patient was taken to the holding area in stable condition.  Operative management:  The patient's films will be reviewed by Dr. Trula Slade he will come up with her operative plan.  Ruta Hinds, MD [facility] [place] Office: [phone] Pager: [phone]

## 2016-07-08 NOTE — H&P (View-Only) (Signed)
Hospital Consult    Reason for Consult:  necrotic left 2nd toe Referring Physician:  Lorin Mercy MRN #:  [id number]  History of Present Illness: This is a 64 y.o. female who presented to the ED yesterday with complaints of continued non-healing wound of her left 2nd toe.  She had hot grease splash on her left 2nd toe in early September.  She was seen at [facility] and was diagnosed with a paronychia with possible abscess.  Her xray was negative for osteomyelitis and she was give Doxycycline for 10 days.  It is reported that a doppler signal was obtained with the doppler and sensation was noted to be in tact.    Pt tells me that the pain in her foot started on 05/28/16 after they pulled the nail bed off.  She states that prior to this, she did not have any claudication symptoms or pain in her foot.  At this time, she says hanging her foot over the side of the bed makes her pain feel better.  She does not like the sheets to touch her foot.  She says that she still has weakness in her left leg and compensates with her right leg.  She states that she has weakness in her left hand as well.  She states she did have some "swelling" in the toe, but after the antibiotics, it did get better.  She states that she hasn't been able to walk for a couple of weeks due to the pain.   On 06/08/16, she established care with Dr. Eulas Post.  At that time, she was diagnosed with cellulitis of the left 2nd toe and given Levaquin 750mg  x 10d.  A wound cx was obtained.  Multiple organisms were present-none dominant & she was continued on Abx.  The gram stain revealed abundant GNR and moderate GPC in pairs.   Her pain and the wound has worsened over the past couple of weeks and now she can hardly walk.  In the ER, she had ABI's and the results are listed below.   She takes a daily aspirin.  She is on clonidine, ACEI, and HCTZ for blood pressure management.  She is on a statin for cholesterol management.  She is on Lovenox for DVT  prophylaxis while in the hospital.  She does have a hx of right thalamic stroke in 2008.  She continues to smoke.   She has a hx of left CFA to below knee popliteal artery bypass grafting with 46mm ringed propaten graft by Dr. Trula Slade on 05/30/08.   She had this done for a left leg ulcer that was present for ~ 6 months. She was seen again on 10/13/08 and her wound had nearly healed. As far as I can tell, this was her last visit with [facility].  Past Medical History:  Diagnosis Date  . Hyperlipidemia   . Hypertension   . Peripheral vascular disease ([place])    vein surgery, left leg  . Stroke [facility]) 2002   ongoing mild loss of feeling in left side, had to learn to write right-handed  . Tobacco abuse     Past Surgical History:  Procedure Laterality Date  . VEIN SURGERY Left     Allergies  Allergen Reactions  . Penicillins Swelling    Has patient had a PCN reaction causing immediate rash, facial/tongue/throat swelling, SOB or lightheadedness with hypotension: YES Has patient had a PCN reaction causing severe rash involving mucus membranes or skin necrosis: NO Has patient had a PCN reaction  that required hospitalization NO Has patient had a PCN reaction occurring within the last 10 years: NO If all of the above answers are "NO", then may proceed with Cephalosporin use.    Prior to Admission medications   Medication Sig Start Date End Date Taking? Authorizing Provider  aspirin EC 81 MG tablet TAKE 1 TABLET (325 MG TOTAL) BY MOUTH DAILY. 10/22/15  Yes Thayer Headings, MD  cloNIDine (CATAPRES) 0.2 MG tablet Take 1 tablet (0.2 mg total) by mouth 2 (two) times daily. 10/22/15  Yes Thayer Headings, MD  hydrochlorothiazide (HYDRODIURIL) 25 MG tablet Take 0.5 tablets (12.5 mg total) by mouth daily. Patient taking differently: Take 12.5 mg by mouth every other day.  10/22/15  Yes Thayer Headings, MD  lisinopril (PRINIVIL,ZESTRIL) 40 MG tablet Take 1 tablet (40 mg total) by mouth daily. 10/22/15  Yes Thayer Headings, MD  potassium chloride (MICRO-K) 10 MEQ CR capsule TAKE 1 TABLET (10 MEQ TOTAL) BY MOUTH DAILY. 05/03/16  Yes Thayer Headings, MD  saccharomyces boulardii (FLORASTOR) 250 MG capsule Take 1 capsule (250 mg total) by mouth 2 (two) times daily. 06/08/16  Yes Monica Eulas Post, DO  simvastatin (ZOCOR) 40 MG tablet TAKE 1 TABLET (40 MG TOTAL) BY MOUTH AT BEDTIME. 10/22/15  Yes Thayer Headings, MD    Social History   Social History  . Marital status: Widowed    Spouse name: N/A  . Number of children: N/A  . Years of education: N/A   Occupational History  . retired    Social History Main Topics  . Smoking status: Current Some Day Smoker    Years: 25.00    Types: Cigarettes  . Smokeless tobacco: Never Used     Comment: currently down to a few cigarettes per day  . Alcohol use Yes     Comment: every now and then, last drink was maybe in July  . Drug use:     Types: Marijuana  . Sexual activity: Not on file   Other Topics Concern  . Not on file   Social History Narrative   DIET: None      DO YOU DRINK/EAT THINGS WITH CAFFEINE: no      MARITAL STATUS: Widow      WHAT YEAR WERE YOU MARRIED: 1990      DO YOU LIVE IN A HOUSE, APARTMENT, ASSISTED LIVING, CONDO TRAILER ETC.:  House      IS IT ONE OR MORE STORIES: One      HOW MANY PERSONS LIVE IN YOUR HOME: 3      DO YOU HAVE PETS IN YOUR HOME: no      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      DO YOU EXERCISE: sometimes      WHAT TYPE AND HOW OFTEN: once a week     Family History  Problem Relation Age of Onset  . Hypertension Mother 21    ROS: [x]  Positive   [ ]  Negative   [ ]  All sytems reviewed and are negative  Cardiovascular: []  chest pain/pressure []  palpitations []  SOB lying flat []  DOE []  pain in legs while walking []  pain in legs at rest []  pain in legs at night [x]  non-healing ulcers []  hx of DVT []  swelling in legs  Pulmonary: []  productive cough []  asthma/wheezing []  home O2  Neurologic: [x]   weakness in left arm and leg []  numbness in []  arms []  legs [x]  hx of CVA with left sided weakness []  mini  stroke [] difficulty speaking or slurred speech []  temporary loss of vision in one eye []  dizziness  Hematologic: []  hx of cancer []  bleeding problems []  problems with blood clotting easily  Endocrine:   []  diabetes []  thyroid disease  GI []  vomiting blood []  blood in stool  GU: []  CKD/renal failure []  HD--[]  M/W/F or []  T/T/S []  burning with urination []  blood in urine  Psychiatric: []  anxiety []  depression  Musculoskeletal: []  arthritis []  joint pain  Integumentary: []  rashes []  ulcers  Constitutional: []  fever []  chills   Physical Examination  Vitals:   07/05/16 2323 07/06/16 0845  BP: 139/72 100/67  Pulse: 95 83  Resp: 16 16  Temp: 98.2 F (36.8 C) 98.5 F (36.9 C)   Body mass index is 22.04 kg/m.  General:  WDWN in NAD Gait: Not observed HENT: WNL, normocephalic Pulmonary: normal non-labored breathing, without Rales, rhonchi,  wheezing Cardiac: regular, without  Murmurs, rubs or gallops; without carotid bruits Abdomen:  soft, NT/ND, no masses Skin: without rashes Vascular Exam/Pulses:  Right Left  Femoral Monophasic doppler signal Faint doppler signal  Popliteal Unable to palpate  Unable to palpate   DP Monophasic doppler signal absent  PT absent absent  Peroneal absent absent   Extremities: left 2nd toe with necrotic tip-no drainage.  Well healed medial wound just proximal to malleolus Musculoskeletal: no muscle wasting or atrophy  Neurologic: A&O X 3; 5/5 RUE; 4/5 LUE; Speech is fluent/normal Psychiatric:  Normal affect Lymph:  No inguinal lymphadenopathy   CBC    Component Value Date/Time   WBC 9.3 07/06/2016 0639   RBC 3.59 (L) 07/06/2016 0639   HGB 11.0 (L) 07/06/2016 0639   HCT 31.9 (L) 07/06/2016 0639   PLT 283 07/06/2016 0639   MCV 88.9 07/06/2016 0639   MCH 30.6 07/06/2016 0639   MCHC 34.5 07/06/2016 0639   RDW  12.8 07/06/2016 0639   LYMPHSABS 2.2 07/05/2016 2015   MONOABS 0.7 07/05/2016 2015   EOSABS 0.3 07/05/2016 2015   BASOSABS 0.0 07/05/2016 2015    BMET    Component Value Date/Time   NA 135 07/06/2016 0639   K 3.6 07/06/2016 0639   CL 104 07/06/2016 0639   CO2 21 (L) 07/06/2016 0639   GLUCOSE 119 (H) 07/06/2016 0639   BUN 25 (H) 07/06/2016 0639   CREATININE 1.47 (H) 07/06/2016 0639   CREATININE 1.52 (H) 06/08/2016 1008   CALCIUM 9.2 07/06/2016 0639   GFRNONAA 37 (L) 07/06/2016 0639   GFRNONAA 36 (L) 06/08/2016 1008   GFRAA 42 (L) 07/06/2016 0639   GFRAA 41 (L) 06/08/2016 1008    COAGS: Lab Results  Component Value Date   INR 1.07 07/05/2016   INR 1.0 05/29/2008     Non-Invasive Vascular Imaging:   ABI's 07/05/16  RIGHT   LEFT    PRESSURE WAVEFORM  PRESSURE WAVEFORM  BRACHIAL 150 Triphasic BRACHIAL 153 Triphasic  DP 69 Severely dampened monophasic flow DP  No discernable waveform  AT   AT    PT  No discernable waveform PT  No discernable waveform  PER   PER    GREAT TOE  NA GREAT TOE  No discernable waveform    RIGHT LEFT  ABI 0.45 Unable to calculate due to absent pulses     Statin:  Yes.   Beta Blocker:  No. Aspirin:  Yes.   ACEI:  Yes.   ARB:  No. Other antiplatelets/anticoagulants:  No. (Lovenox in hospital for DVT prophylaxis)   ASSESSMENT/PLAN:  This is a 65 y.o. female with non healing wound left 2nd toe present since September & hx of left femoral to below knee popliteal bypass with propaten in 2009 by Dr. Trula Slade.   -pt with absent doppler signals left foot.  She does have a faint doppler signal left femoral artery.  Her bypass has most likely occluded, but unable to tell when by speaking with her.   -she will need an aortogram with BLE runoff and possible intervention--time to be determined by Dr. Trula Slade, but most likely tomorrow.  Will pre op her.  Her creatinine today is 1.47 and was 1.80 yesterday. -I discussed the  importance of smoking cessation with the pt and she expressed understanding. -Dr. Trula Slade will be by to see the pt later today.   Leontine Locket, PA-C [facility] [phone]   I agree with the above. I have seen and evaluated the patient.  She has a limb threatening ischemic process to the left leg which has been going on for several weeks.  Will plan for angiography on Friday to evaluate revascularization options.  Will limit IV dye given recent increase in creatinine   Wells Charistopher Rumble

## 2016-07-08 NOTE — Care Management Important Message (Signed)
Important Message  Patient Details  Name: Wendy Townsend MRN: [id number] Date of Birth: 03-12-1951   Medicare Important Message Given:  Yes    Satoya Feeley 07/08/2016, 4:15 PM

## 2016-07-08 NOTE — Interval H&P Note (Signed)
History and Physical Interval Note:  07/08/2016 11:59 AM  Wendy Townsend  has presented today for surgery, with the diagnosis of pad -  ulcer  The various methods of treatment have been discussed with the patient and family. After consideration of risks, benefits and other options for treatment, the patient has consented to  Procedure(s): Abdominal Aortogram w/ Bilateral Lower Extremity Runoff (N/A) as a surgical intervention .  The patient's history has been reviewed, patient examined, no change in status, stable for surgery.  I have reviewed the patient's chart and labs.  Questions were answered to the patient's satisfaction.     Ruta Hinds

## 2016-07-09 DIAGNOSIS — R0602 Shortness of breath: Secondary | ICD-10-CM

## 2016-07-09 DIAGNOSIS — Z01810 Encounter for preprocedural cardiovascular examination: Secondary | ICD-10-CM

## 2016-07-09 NOTE — Progress Notes (Signed)
    Subjective  - POD #1, s/p angio  No acute events overnight   Physical Exam:  Rest pain in left foot with ulcer       Assessment/Plan:    I discussed with the patient and her family that she will need surgical revascularization for limb salvage.  This will most likely need to be an aortobifemoral bypass graft with redo left femoral posterior tibial bypass.  This will be an extensive operation and does not guarantee limb salvage.  Without revascularization she will end up with a left leg amputation.  She is going to need formal cardiac clearance.  Surgical plans will be determined following cardiac evaluation.  Annamarie Major 07/09/2016 12:34 PM --  Vitals:   07/09/16 0500 07/09/16 1022  BP: (!) 143/98 119/64  Pulse: 93 83  Resp: 18 18  Temp: 98.2 F (36.8 C) 98.3 F (36.8 C)    Intake/Output Summary (Last 24 hours) at 07/09/16 1234 Last data filed at 07/09/16 0123  Gross per 24 hour  Intake                0 ml  Output              300 ml  Net             -300 ml     Laboratory CBC    Component Value Date/Time   WBC 7.4 07/07/2016 0531   HGB 11.0 (L) 07/07/2016 0531   HCT 32.5 (L) 07/07/2016 0531   PLT 288 07/07/2016 0531    BMET    Component Value Date/Time   NA 138 07/08/2016 0536   K 3.5 07/08/2016 0536   CL 106 07/08/2016 0536   CO2 24 07/08/2016 0536   GLUCOSE 108 (H) 07/08/2016 0536   BUN 19 07/08/2016 0536   CREATININE 1.05 (H) 07/08/2016 0536   CREATININE 1.52 (H) 06/08/2016 1008   CALCIUM 9.0 07/08/2016 0536   GFRNONAA 55 (L) 07/08/2016 0536   GFRNONAA 36 (L) 06/08/2016 1008   GFRAA >60 07/08/2016 0536   GFRAA 41 (L) 06/08/2016 1008    COAG Lab Results  Component Value Date   INR 1.15 07/07/2016   INR 1.07 07/05/2016   INR 1.0 05/29/2008   No results found for: PTT  Antibiotics Anti-infectives    None       V. [name], M.D. [facility] [place] Office: [phone] Pager:   [phone]

## 2016-07-09 NOTE — Consult Note (Signed)
Primary cardiologist: Dr Liam Rogers Consulting cardiologist: Dr Carlyle Dolly Requesting physician: Dr Dyann Kief Indication: preoperative cardiac evaluation  Clinical Summary Ms. Bisaillon is a 65 y.o.female history of HTN, HL, prior CVA, PAD, tobacco abuse, admitted with left foot pain and ulcer. She is being considered for aortobifemoral bypass grafting. She has an extensive PAD history but no known CAD history or clinical events. Her daily exertion is limited primarily by her chronic leg pain. She can have some occasional SOB with activities. Denies any significant chest pain.     Allergies  Allergen Reactions  . Penicillins Swelling    Has patient had a PCN reaction causing immediate rash, facial/tongue/throat swelling, SOB or lightheadedness with hypotension: YES Has patient had a PCN reaction causing severe rash involving mucus membranes or skin necrosis: NO Has patient had a PCN reaction that required hospitalization NO Has patient had a PCN reaction occurring within the last 10 years: NO If all of the above answers are "NO", then may proceed with Cephalosporin use.    Medications Scheduled Medications: . aspirin EC  81 mg Oral Daily  . cloNIDine  0.2 mg Oral BID  . enoxaparin (LOVENOX) injection  40 mg Subcutaneous QHS  . feeding supplement  1 Container Oral TID BM  . feeding supplement (PRO-STAT SUGAR FREE 64)  30 mL Oral BID  . nicotine  7 mg Transdermal Daily  . saccharomyces boulardii  250 mg Oral BID  . simvastatin  40 mg Oral q1800     Infusions: . sodium chloride 100 mL/hr at 07/08/16 1443  . sodium chloride 75 mL/hr at 07/08/16 0629  . lactated ringers 50 mL/hr at 07/06/16 1956     PRN Medications:  acetaminophen **OR** acetaminophen, hydrALAZINE, hydrALAZINE, labetalol, metoprolol, morphine injection, ondansetron **OR** ondansetron (ZOFRAN) IV, senna-docusate   Past Medical History:  Diagnosis Date  . Hyperlipidemia   . Hypertension   .  Peripheral vascular disease ([place])    vein surgery, left leg  . Stroke [facility]) 2002   ongoing mild loss of feeling in left side, had to learn to write right-handed  . Tobacco abuse     Past Surgical History:  Procedure Laterality Date  . VEIN SURGERY Left     Family History  Problem Relation Age of Onset  . Hypertension Mother 70    Social History Ms. Barkey reports that she has been smoking Cigarettes.  She has smoked for the past 25.00 years. She has never used smokeless tobacco. Ms. Jock reports that she drinks alcohol.  Review of Systems CONSTITUTIONAL: No weight loss, fever, chills, weakness or fatigue.  HEENT: Eyes: No visual loss, blurred vision, double vision or yellow sclerae. No hearing loss, sneezing, congestion, runny nose or sore throat.  SKIN: No rash or itching.  CARDIOVASCULAR: No chest pain, chest pressure or chest discomfort. No palpitations or edema.  RESPIRATORY: occasional SOB  GASTROINTESTINAL: No anorexia, nausea, vomiting or diarrhea. No abdominal pain or blood.  GENITOURINARY: no polyuria, no dysuria NEUROLOGICAL: No headache, dizziness, syncope, paralysis, ataxia, numbness or tingling in the extremities. No change in bowel or bladder control.  MUSCULOSKELETAL: + leg pain HEMATOLOGIC: No anemia, bleeding or bruising.  LYMPHATICS: No enlarged nodes. No history of splenectomy.  PSYCHIATRIC: No history of depression or anxiety.      Physical Examination Blood pressure 119/64, pulse 83, temperature 98.3 F (36.8 C), temperature source Oral, resp. rate 18, height 5\' 4"  (1.626 m), weight 128 lb 6.4 oz (58.2 kg), SpO2 100 %.  Intake/Output  Summary (Last 24 hours) at 07/09/16 1429 Last data filed at 07/09/16 1230  Gross per 24 hour  Intake              480 ml  Output              300 ml  Net              180 ml    HEENT: sclera clear, throat clear  Cardiovascular: RRR, no m/r/g, no jvd  Respiratory: CTAB  GI: abdomen soft, NT, ND  MSK:  trace bilateral edema  Neuro: no focal deficits  Psych: appropriate affect   Lab Results  Basic Metabolic Panel:  Recent Labs Lab 07/05/16 2015 07/06/16 0639 07/07/16 0531 07/08/16 0536  NA 136 135 139 138  K 4.0 3.6 3.9 3.5  CL 100* 104 104 106  CO2 22 21* 25 24  GLUCOSE 112* 119* 95 108*  BUN 28* 25* 23* 19  CREATININE 1.80* 1.47* 1.17* 1.05*  CALCIUM 10.1 9.2 9.2 9.0    Liver Function Tests: No results for input(s): AST, ALT, ALKPHOS, BILITOT, PROT, ALBUMIN in the last 168 hours.  CBC:  Recent Labs Lab 07/05/16 2015 07/06/16 0639 07/07/16 0531  WBC 9.4 9.3 7.4  NEUTROABS 6.2  --   --   HGB 13.0 11.0* 11.0*  HCT 38.0 31.9* 32.5*  MCV 90.3 88.9 89.5  PLT 315 283 288    Cardiac Enzymes: No results for input(s): CKTOTAL, CKMB, CKMBINDEX, TROPONINI in the last 168 hours.  BNP: Invalid input(s): POCBNP     Impression/Recommendations 1. Preoperative cardiac evaluation - she is being considered for high risk vascular surgery - she has no known cardiac disease but based on her extensive PAD history is high risk for CAD as well - unable to determine exercise functional capacity by history, she is primarily limited by her chronic foot pain - in this setting would recommend noninvasive stress imaging to further qualify her risk. Would also obtain echo to evaluate her cardiac function. Pending test results will be able to better describe her cardiac risk.      Carlyle Dolly, M.D.,

## 2016-07-09 NOTE — Progress Notes (Signed)
[facility] PROGRESS NOTE  Wendy Townsend FTD:[id number] DOB: 1950/11/20 DOA: 07/05/2016 PCP: Gildardo Cranker, DO  Interim summary and HPI 65 y.o. female with medical history significant of CVA, PVD, HTN, HLD, tobacco abuse presenting with a necrotic toe.  She splashed hot grease on her left second toe in early September and was seen at [facility] on September 9.   She was diagnosed with a paronychia with possible abscess, xray negative for osteomyelitis. She was given Tramadol and Doxy x 10 days.  At that time, her foot did not feel cold and a DP pulse was found with the Doppler.  Sensation was noted to be intact.    She established care with Dr. Eulas Post on 9/20.  She was diagnosed with cellulitis of the left second toe and given Levaquin 750 mg x 10 days; a wound culture was obtained and she was referred (again) to podiatry.    Now can hardly walk on foot.  It has worsened over the last 2 weeks.  +pain.  Denies drainage.  Assessment/Plan: Dry gangrene with PVD/ischemic pain of the left foot -Patient with apparent gangrene of the left 2nd toe -Left foot cold, without palpable pulses, ABIs appear to indicate markedly diminished flow to the affected extremity. -Vascular surgery consulted; will follow rec's; status post arteriogram. Will need bypass surgery and attempt for revascularization. looking for arteriogram later today -It seems extremely likely that she will lose the toe if not the whole foot; but before determining that, will focus first on revascularization option to guaranteed healing. -continue PRN analgesic regimen  -Diabetic foot order set utilized, but without antibiotics since there is no current evidence of infection appreciated  Surgery clearance -high risk due to extensive PAD -cardiology consulted -will check non-invasive stress test and echo  HTN -Home meds include Clonidine, HCTZ, Lisinopril -will continue Holding HCTZ and Lisinopril due to presentation of acute  renal dysfunction and the fact her BP is stable w/o it. Patient also with plans for further future use of contrast. -Will give prn IV Hydralazine in case of poor BP control -continue clonidine   HLD -Recent lipid profile (06/08/16) shows LDL 32, HDL 53 -continue Simvastatin 40 mg daily  Tobacco dependence -Encourage cessation.  This was discussed with the patient  -Patch ordered.  AKI on CKD: stage 3 at baseline  -Recent creatinine1.17 (back to baseline) -will continue holding nephrotoxic agents (HCTZ and ACE) -Will continue gentle IVF's overnight  Hyperglycemia -A1C pending -will monitor CBG's intermittently and if > 200 will start SSI  Protein calorie malnutrition: severe -nutrition supplements as per nutritional service recommendations   Code Status: Full Family Communication: daughter at bedside  Disposition Plan: to be determine by vascular surgery; anticipate need for at least toe amputation. Per vascular note looking to assess with angiogram status of prior bypass and then determine surgical plan.   Consultants:  Vascular surgery   Cardiology   Procedures:  See below for x-ray reports   arteriogram 10/20  2-D echo  Non-invasive stress test  Antibiotics:  None   HPI/Subjective: Afebrile, in no distress. Denies CP, nausea, vomiting and SOB. Reports intermittent pain in her left foot.  Objective: Vitals:   07/09/16 1022 07/09/16 1518  BP: 119/64 113/74  Pulse: 83 86  Resp: 18 18  Temp: 98.3 F (36.8 C) 98.6 F (37 C)    Intake/Output Summary (Last 24 hours) at 07/09/16 1645 Last data filed at 07/09/16 1230  Gross per 24 hour  Intake  480 ml  Output              300 ml  Net              180 ml   Filed Weights   07/05/16 2323  Weight: 58.2 kg (128 lb 6.4 oz)    Exam:   General:  Afebrile, non-toxic in appearance. Patient denies CP and SOB. Reports intermittent pain in her left foot  Cardiovascular: S1 and S2, no rubs,  no gallops  Respiratory: CTA bilaterally   Abdomen: soft, NT, ND, positive BS  Musculoskeletal: no edema, positive left 2nd toe gangrene appreciated; decrease pedal pulses bilaterally (L > R)  Data Reviewed: Basic Metabolic Panel:  Recent Labs Lab 07/05/16 2015 07/06/16 0639 07/07/16 0531 07/08/16 0536  NA 136 135 139 138  K 4.0 3.6 3.9 3.5  CL 100* 104 104 106  CO2 22 21* 25 24  GLUCOSE 112* 119* 95 108*  BUN 28* 25* 23* 19  CREATININE 1.80* 1.47* 1.17* 1.05*  CALCIUM 10.1 9.2 9.2 9.0   CBC:  Recent Labs Lab 07/05/16 2015 07/06/16 0639 07/07/16 0531  WBC 9.4 9.3 7.4  NEUTROABS 6.2  --   --   HGB 13.0 11.0* 11.0*  HCT 38.0 31.9* 32.5*  MCV 90.3 88.9 89.5  PLT 315 283 288   CBG: No results for input(s): GLUCAP in the last 168 hours.  No results found for this or any previous visit (from the past 240 hour(s)).   Studies: No results found.  Scheduled Meds: . aspirin EC  81 mg Oral Daily  . cloNIDine  0.2 mg Oral BID  . enoxaparin (LOVENOX) injection  40 mg Subcutaneous QHS  . feeding supplement  1 Container Oral TID BM  . feeding supplement (PRO-STAT SUGAR FREE 64)  30 mL Oral BID  . nicotine  7 mg Transdermal Daily  . saccharomyces boulardii  250 mg Oral BID  . simvastatin  40 mg Oral q1800   Continuous Infusions: . sodium chloride 100 mL/hr at 07/08/16 1443  . sodium chloride 75 mL/hr at 07/08/16 0629  . lactated ringers 50 mL/hr at 07/06/16 1956    Principal Problem:   Dry gangrene ([place]) Active Problems:   Hypertension   Hyperlipidemia   Tobacco use disorder   Ischemic pain of left foot   PVD (peripheral vascular disease) ([place])   Hyperglycemia   Acute kidney injury superimposed on chronic kidney disease ([place])   Protein-calorie malnutrition, severe    Time spent: 25 minutes    Barton Dubois  [facility] Pager [phone]. If 7PM-7AM, please contact night-coverage at [email or web address], password [facility] 07/09/2016, 4:45 PM  LOS: 4 days

## 2016-07-10 ENCOUNTER — Inpatient Hospital Stay (HOSPITAL_COMMUNITY): Payer: Commercial Managed Care - HMO

## 2016-07-10 DIAGNOSIS — R0600 Dyspnea, unspecified: Secondary | ICD-10-CM

## 2016-07-10 DIAGNOSIS — I96 Gangrene, not elsewhere classified: Secondary | ICD-10-CM

## 2016-07-10 LAB — ECHOCARDIOGRAM COMPLETE
HEIGHTINCHES: 64 in
Weight: 2054.4 oz

## 2016-07-10 LAB — NM MYOCAR MULTI W/SPECT W/WALL MOTION / EF
CHL CUP MPHR: 156 {beats}/min
Estimated workload: 1 METS
Exercise duration (min): 5 min
Peak HR: 133 {beats}/min
Percent HR: 85 %
Rest HR: 85 {beats}/min

## 2016-07-10 MED ORDER — TECHNETIUM TC 99M TETROFOSMIN IV KIT
10.0000 | PACK | Freq: Once | INTRAVENOUS | Status: AC | PRN
  Administered 2016-07-10: 10 via INTRAVENOUS

## 2016-07-10 MED ORDER — REGADENOSON 0.4 MG/5ML IV SOLN
0.4000 mg | Freq: Once | INTRAVENOUS | Status: AC
  Administered 2016-07-10: 0.4 mg via INTRAVENOUS
  Filled 2016-07-10: qty 5

## 2016-07-10 MED ORDER — TECHNETIUM TC 99M TETROFOSMIN IV KIT
30.0000 | PACK | Freq: Once | INTRAVENOUS | Status: AC | PRN
  Administered 2016-07-10: 30 via INTRAVENOUS

## 2016-07-10 MED ORDER — REGADENOSON 0.4 MG/5ML IV SOLN
INTRAVENOUS | Status: AC
  Filled 2016-07-10: qty 5

## 2016-07-10 NOTE — Progress Notes (Signed)
Patient Name: Wendy Townsend Date of Encounter: 07/10/2016  Primary Cardiologist: Dr. Antonieta Loveless Problem List     Principal Problem:   Dry gangrene [facility]) Active Problems:   Hypertension   Hyperlipidemia   Tobacco use disorder   Ischemic pain of left foot   PVD (peripheral vascular disease) (H[facility])   Hyperglycemia   Acute kidney injury superimposed on chronic kidney disease (HCC)   Protein-calorie malnutrition, severe     Patient Profile     Wendy Townsend is a 65 y.o.female history of HTN, HL, prior CVA, PAD, tobacco abuse, admitted with left foot pain and ulcer. She is being considered for aortobifemoral bypass grafting. She has an extensive PAD history but no known CAD history or clinical events. Her daily exertion is limited primarily by her chronic leg pain. She can have some occasional SOB with activities. Denies any significant chest pain. NST 10/22 intermediate risk with Stress perfusion defect present of the mid inferior wall extending to the base and also laterally compatible with inducible ischemia with pharmacologic stress.  Subjective   Doing ok at rest. No chest pain.     Inpatient Medications    Scheduled Meds: . aspirin EC  81 mg Oral Daily  . cloNIDine  0.2 mg Oral BID  . enoxaparin (LOVENOX) injection  40 mg Subcutaneous QHS  . feeding supplement  1 Container Oral TID BM  . feeding supplement (PRO-STAT SUGAR FREE 64)  30 mL Oral BID  . nicotine  7 mg Transdermal Daily  . regadenoson      . saccharomyces boulardii  250 mg Oral BID  . simvastatin  40 mg Oral q1800   Continuous Infusions: . sodium chloride 100 mL/hr at 07/09/16 1940  . sodium chloride 75 mL/hr at 07/08/16 0629  . lactated ringers 50 mL/hr at 07/06/16 1956   PRN Meds: acetaminophen **OR** acetaminophen, hydrALAZINE, hydrALAZINE, labetalol, metoprolol, morphine injection, ondansetron **OR** ondansetron (ZOFRAN) IV, senna-docusate   Vital Signs    Vitals:   07/10/16 1149  07/10/16 1206 07/10/16 1207 07/10/16 1209  BP: 132/72 129/72 133/71 (!) 143/65  Pulse:      Resp:      Temp:      TempSrc:      SpO2:      Weight:      Height:        Intake/Output Summary (Last 24 hours) at 07/10/16 1444 Last data filed at 07/10/16 0730  Gross per 24 hour  Intake              540 ml  Output                0 ml  Net              540 ml   Filed Weights   07/05/16 2323  Weight: 128 lb 6.4 oz (58.2 kg)    Physical Exam   GEN:  in no acute distress, thin appearing  HEENT: Grossly normal.  Neck: Supple, no JVD, carotid bruits, or masses. Cardiac: RRR, no murmurs, rubs, or gallops. No clubbing, cyanosis, edema.  Radials/DP/PT 2+ and equal bilaterally.  Respiratory:  Respirations regular and unlabored, clear to auscultation bilaterally. GI: Soft, nontender, nondistended, BS + x 4. MS: no deformity or atrophy. Skin: warm and dry, no rash. Neuro:  Strength and sensation are intact. Psych: AAOx3.  Normal affect.  Labs    CBC No results for input(s): WBC, NEUTROABS, HGB, HCT, MCV, PLT in the last 72 hours.  Basic Metabolic Panel  Recent Labs  07/08/16 0536  NA 138  K 3.5  CL 106  CO2 24  GLUCOSE 108*  BUN 19  CREATININE 1.05*  CALCIUM 9.0   Liver Function Tests No results for input(s): AST, ALT, ALKPHOS, BILITOT, PROT, ALBUMIN in the last 72 hours. No results for input(s): LIPASE, AMYLASE in the last 72 hours. Cardiac Enzymes No results for input(s): CKTOTAL, CKMB, CKMBINDEX, TROPONINI in the last 72 hours. BNP Invalid input(s): POCBNP D-Dimer No results for input(s): DDIMER in the last 72 hours. Hemoglobin A1C  Recent Labs  07/08/16 0536  HGBA1C 6.2*   Fasting Lipid Panel No results for input(s): CHOL, HDL, LDLCALC, TRIG, CHOLHDL, LDLDIRECT in the last 72 hours. Thyroid Function Tests No results for input(s): TSH, T4TOTAL, T3FREE, THYROIDAB in the last 72 hours.  Invalid input(s): FREET3  Telemetry    NSR.   ECG    NSR -  Personally Reviewed  Radiology    Nm Myocar Multi W/spect W/wall Motion / Ef  Result Date: 07/10/2016 CLINICAL DATA:  Chest pain and shortness of breath EXAM: MYOCARDIAL IMAGING WITH SPECT (REST AND PHARMACOLOGIC-STRESS) GATED LEFT VENTRICULAR WALL MOTION STUDY LEFT VENTRICULAR EJECTION FRACTION TECHNIQUE: Standard myocardial SPECT imaging was performed after resting intravenous injection of 10 mCi Tc-72m tetrofosmin. Subsequently, intravenous infusion of Lexiscan was performed under the supervision of the Cardiology staff. At peak effect of the drug, 30 mCi Tc-52m tetrofosmin was injected intravenously and standard myocardial SPECT imaging was performed. Quantitative gated imaging was also performed to evaluate left ventricular wall motion, and estimate left ventricular ejection fraction. COMPARISON:  None available FINDINGS: Perfusion: Stress perfusion defect present of the mid inferior wall extending to the base and also laterally compatible with inducible ischemia with pharmacologic stress. Wall Motion: Normal left ventricular wall motion. No left ventricular dilation. Left Ventricular Ejection Fraction: 73 % End diastolic volume 41 ml End systolic volume 11 ml IMPRESSION: 1. Positive exam for inferior inducible ischemia extending laterally. 2. Normal left ventricular wall motion. 3. Left ventricular ejection fraction 73% 4. Non invasive risk stratification*: Intermediate to high *2012 Appropriate Use Criteria for Coronary Revascularization Focused Update: J Am Coll Cardiol. 1194;17(4):081-448. http://content.airportbarriers.com.aspx?articleid=1201161 These results will be called to the ordering clinician or representative by the Radiologist Assistant, and communication documented in the PACS or zVision Dashboard. Electronically Signed   By: Jerilynn Mages.  Shick M.D.   On: 07/10/2016 13:32    Cardiac Studies   Stress test 07/10/16 (see above)   Patient Profile     Wendy Townsend is a 65 y.o.female history  of HTN, HL, prior CVA, PAD, tobacco abuse, admitted with left foot pain and ulcer. She is being considered for aortobifemoral bypass grafting. She has an extensive PAD history but no known CAD history or clinical events. Her daily exertion is limited primarily by her chronic leg pain. She can have some occasional SOB with activities. Denies any significant chest pain.    Assessment & Plan    Principal Problem:   Dry gangrene ([place]) Active Problems:   Hypertension   Hyperlipidemia   Tobacco use disorder   Ischemic pain of left foot   PVD (peripheral vascular disease) (HCC)   Hyperglycemia   Acute kidney injury superimposed on chronic kidney disease (HCC)   Protein-calorie malnutrition, severe   1. Abnormal Stress Test: conducted as part of pre-operative w/u. Study showed stress perfusion defect present of the mid inferior wall extending to the base and also laterally compatible with inducible ischemia with pharmacologic  stress. Given her severe PVD and stress findings, she likely has coronary disease and will need LHC. However we will need to consider possible need for Plavix if intervention is performed. This would require at least 1-3 months of antiplatelet therapy if PCI, which would delay surgery for ischemic lower extremity. MD to assess and will further advise.    Signed, Lyda Jester, PA-C  07/10/2016, 2:44 PM   Attending Note:   The patient was seen and examined.  Agree with assessment and plan as noted above.  Changes made to the above note as needed.  Patient seen and independently examined with Lyda Jester, PA .   We discussed all aspects of the encounter. I agree with the assessment and plan as stated above.  I have reviewed the images from the [facility] and the myoview images from 2009  The inferior defect could be artifact but since she is having major vascular surgery, we need to know for sure. I would suggest that we do a left heart cath for further  evaluation  Her LV function is well preserved.    I have spent a total of 40 minutes with patient reviewing hospital  notes , telemetry, EKGs, labs and examining patient as well as establishing an assessment and plan that was discussed with the patient. > 50% of time was spent in direct patient care.    Thayer Headings, Brooke Bonito., MD, [facility] 07/10/2016, 7:04 PM [address]  [place] Pager [phone]

## 2016-07-10 NOTE — Progress Notes (Signed)
  Echocardiogram 2D Echocardiogram has been performed.  Wendy Townsend 07/10/2016, 3:17 PM

## 2016-07-10 NOTE — Progress Notes (Signed)
Pt constipated but NPO midnight will endorsed to day shift to give senna as PRN.

## 2016-07-10 NOTE — Progress Notes (Signed)
[facility] PROGRESS NOTE  Wendy Townsend DXA:[id number] DOB: [birth year] DOA: 07/05/2016 PCP: Gildardo Cranker, DO  Interim summary and HPI 65 y.o. female with medical history significant of CVA, PVD, HTN, HLD, tobacco abuse presenting with a necrotic toe.  She splashed hot grease on her left second toe in early September and was seen at [facility] on September 9.   She was diagnosed with a paronychia with possible abscess, xray negative for osteomyelitis. She was given Tramadol and Doxy x 10 days.  At that time, her foot did not feel cold and a DP pulse was found with the Doppler.  Sensation was noted to be intact.    She established care with Dr. Eulas Post on 9/20.  She was diagnosed with cellulitis of the left second toe and given Levaquin 750 mg x 10 days; a wound culture was obtained and she was referred (again) to podiatry.    Now can hardly walk on foot.  It has worsened over the last 2 weeks.  +pain.  Denies drainage.  Assessment/Plan: Dry gangrene with PVD/ischemic pain of the left foot -Patient with apparent gangrene of the left 2nd toe -Left foot cold, without palpable pulses, ABIs appear to indicate markedly diminished flow to the affected extremity. -Vascular surgery consulted; will follow rec's; status post arteriogram. Will need bypass surgery and attempt for revascularization. looking for arteriogram later today -It seems extremely likely that she will lose the toe if not the whole foot; but before determining that, will focus first on revascularization option to guaranteed healing. -waiting cardiology clearance -continue PRN analgesic regimen  -Diabetic foot order set utilized, but without antibiotics since there is no current evidence of infection appreciated  Surgery clearance -high risk due to extensive PAD -cardiology consulted, will follow rec's -will check non-invasive stress test and echo later today  HTN -Home meds include Clonidine, HCTZ, Lisinopril -will continue  Holding HCTZ and Lisinopril due to presentation of acute renal dysfunction and the fact her BP is stable w/o it. Patient also with plans for further future use of contrast. -Will give prn IV Hydralazine in case of poor BP control -continue clonidine   HLD -Recent lipid profile (06/08/16) shows LDL 32, HDL 53 -continue Simvastatin 40 mg daily  Tobacco dependence -Encourage cessation.  This was discussed with the patient  -Patch ordered.  AKI on CKD: stage 3 at baseline  -Recent creatinine1.05 (back to baseline) -will continue holding nephrotoxic agents (HCTZ and ACE) -Will continue gentle IVF's overnight  Hyperglycemia -A1C pending -will monitor CBG's intermittently and if > 200 will start SSI  Protein calorie malnutrition: severe -nutrition supplements as per nutritional service recommendations   Code Status: Full Family Communication: daughter at bedside  Disposition Plan: to be determine by vascular surgery; anticipate need for at least toe amputation. Per vascular note will like to have cardiology clearance before proceeding with bypass surgery and revascularization.   Consultants:  Vascular surgery   Cardiology   Procedures:  See below for x-ray reports   arteriogram 10/20: with severe PAD affecting especially her left limb.  2-D echo: pending   Non-invasive stress test: pending  Antibiotics:  None   HPI/Subjective: Afebrile, in no distress. Denies CP  Objective: Vitals:   07/10/16 1207 07/10/16 1209  BP: 133/71 (!) 143/65  Pulse:    Resp:    Temp:      Intake/Output Summary (Last 24 hours) at 07/10/16 1536 Last data filed at 07/10/16 0730  Gross per 24 hour  Intake  540 ml  Output                0 ml  Net              540 ml   Filed Weights   07/05/16 2323  Weight: 58.2 kg (128 lb 6.4 oz)    Exam:   General:  Afebrile, non-toxic in appearance. Patient denies CP and SOB. Continue to experience intermittent pain in her  left foot  Cardiovascular: S1 and S2, no rubs, no gallops  Respiratory: CTA bilaterally   Abdomen: soft, NT, ND, positive BS  Musculoskeletal: no edema, positive left 2nd toe gangrene appreciated; decrease pedal pulses bilaterally (L > R)  Data Reviewed: Basic Metabolic Panel:  Recent Labs Lab 07/05/16 2015 07/06/16 0639 07/07/16 0531 07/08/16 0536  NA 136 135 139 138  K 4.0 3.6 3.9 3.5  CL 100* 104 104 106  CO2 22 21* 25 24  GLUCOSE 112* 119* 95 108*  BUN 28* 25* 23* 19  CREATININE 1.80* 1.47* 1.17* 1.05*  CALCIUM 10.1 9.2 9.2 9.0   CBC:  Recent Labs Lab 07/05/16 2015 07/06/16 0639 07/07/16 0531  WBC 9.4 9.3 7.4  NEUTROABS 6.2  --   --   HGB 13.0 11.0* 11.0*  HCT 38.0 31.9* 32.5*  MCV 90.3 88.9 89.5  PLT 315 283 288   CBG: No results for input(s): GLUCAP in the last 168 hours.  No results found for this or any previous visit (from the past 240 hour(s)).   Studies: Nm Myocar Multi W/spect W/wall Motion / Ef  Result Date: 07/10/2016 CLINICAL DATA:  Chest pain and shortness of breath EXAM: MYOCARDIAL IMAGING WITH SPECT (REST AND PHARMACOLOGIC-STRESS) GATED LEFT VENTRICULAR WALL MOTION STUDY LEFT VENTRICULAR EJECTION FRACTION TECHNIQUE: Standard myocardial SPECT imaging was performed after resting intravenous injection of 10 mCi Tc-84m tetrofosmin. Subsequently, intravenous infusion of Lexiscan was performed under the supervision of the Cardiology staff. At peak effect of the drug, 30 mCi Tc-61m tetrofosmin was injected intravenously and standard myocardial SPECT imaging was performed. Quantitative gated imaging was also performed to evaluate left ventricular wall motion, and estimate left ventricular ejection fraction. COMPARISON:  None available FINDINGS: Perfusion: Stress perfusion defect present of the mid inferior wall extending to the base and also laterally compatible with inducible ischemia with pharmacologic stress. Wall Motion: Normal left ventricular wall  motion. No left ventricular dilation. Left Ventricular Ejection Fraction: 73 % End diastolic volume 41 ml End systolic volume 11 ml IMPRESSION: 1. Positive exam for inferior inducible ischemia extending laterally. 2. Normal left ventricular wall motion. 3. Left ventricular ejection fraction 73% 4. Non invasive risk stratification*: Intermediate to high *2012 Appropriate Use Criteria for Coronary Revascularization Focused Update: J Am Coll Cardiol. 3329;51(8):841-660. http://content.[email or web address].aspx?articleid=[email or web address] These results will be called to the ordering clinician or representative by the Radiologist Assistant, and communication documented in the PACS or zVision Dashboard. Electronically Signed   By: Jerilynn Mages.  Shick M.D.   On: 07/10/2016 13:32    Scheduled Meds: . aspirin EC  81 mg Oral Daily  . cloNIDine  0.2 mg Oral BID  . enoxaparin (LOVENOX) injection  40 mg Subcutaneous QHS  . feeding supplement  1 Container Oral TID BM  . feeding supplement (PRO-STAT SUGAR FREE 64)  30 mL Oral BID  . nicotine  7 mg Transdermal Daily  . regadenoson      . saccharomyces boulardii  250 mg Oral BID  . simvastatin  40 mg Oral q1800   Continuous Infusions: .  sodium chloride 100 mL/hr at 07/09/16 1940  . sodium chloride 75 mL/hr at 07/08/16 0629  . lactated ringers 50 mL/hr at 07/06/16 1956    Principal Problem:   Dry gangrene ([place]) Active Problems:   Hypertension   Hyperlipidemia   Tobacco use disorder   Ischemic pain of left foot   PVD (peripheral vascular disease) ([place])   Hyperglycemia   Acute kidney injury superimposed on chronic kidney disease ([place])   Protein-calorie malnutrition, severe    Time spent: 25 minutes    Barton Dubois  [facility] Pager [phone]. If 7PM-7AM, please contact night-coverage at [email or web address], password [facility] 07/10/2016, 3:36 PM  LOS: 5 days

## 2016-07-10 NOTE — Progress Notes (Signed)
Please see consult note from yesterday regarding cardiac preop evaluation. She is awaiting echo and lexiscan nuclear stress today as part of her preop risk stratification. Final recs pending test results.   Zandra Abts MD

## 2016-07-11 ENCOUNTER — Encounter (HOSPITAL_COMMUNITY): Payer: Self-pay | Admitting: Vascular Surgery

## 2016-07-11 ENCOUNTER — Encounter (HOSPITAL_COMMUNITY)
Admission: EM | Disposition: A | Discharge status: Rehabilitation Facility | Payer: Self-pay | Source: Home / Self Care | Attending: Internal Medicine

## 2016-07-11 DIAGNOSIS — R9439 Abnormal result of other cardiovascular function study: Secondary | ICD-10-CM

## 2016-07-11 DIAGNOSIS — I2510 Atherosclerotic heart disease of native coronary artery without angina pectoris: Secondary | ICD-10-CM

## 2016-07-11 DIAGNOSIS — E1159 Type 2 diabetes mellitus with other circulatory complications: Secondary | ICD-10-CM

## 2016-07-11 DIAGNOSIS — R0600 Dyspnea, unspecified: Secondary | ICD-10-CM

## 2016-07-11 HISTORY — PX: CARDIAC CATHETERIZATION: SHX172

## 2016-07-11 LAB — BASIC METABOLIC PANEL
ANION GAP: 9 (ref 5–15)
BUN: 17 mg/dL (ref 6–20)
CALCIUM: 8.6 mg/dL — AB (ref 8.9–10.3)
CO2: 23 mmol/L (ref 22–32)
CREATININE: 0.99 mg/dL (ref 0.44–1.00)
Chloride: 107 mmol/L (ref 101–111)
GFR, EST NON AFRICAN AMERICAN: 59 mL/min — AB (ref >60)
GLUCOSE: 101 mg/dL — AB (ref 65–99)
Potassium: 3.1 mmol/L — ABNORMAL LOW (ref 3.5–5.1)
Sodium: 139 mmol/L (ref 135–145)

## 2016-07-11 LAB — CBC
HCT: 30 % — ABNORMAL LOW (ref 36.0–46.0)
Hemoglobin: 10.1 g/dL — ABNORMAL LOW (ref 12.0–15.0)
MCH: 30.1 pg (ref 26.0–34.0)
MCHC: 33.7 g/dL (ref 30.0–36.0)
MCV: 89.6 fL (ref 78.0–100.0)
Platelets: 284 K/uL (ref 150–400)
RBC: 3.35 MIL/uL — ABNORMAL LOW (ref 3.87–5.11)
RDW: 13 % (ref 11.5–15.5)
WBC: 6.9 K/uL (ref 4.0–10.5)

## 2016-07-11 SURGERY — LEFT HEART CATH AND CORONARY ANGIOGRAPHY
Anesthesia: LOCAL

## 2016-07-11 MED ORDER — SODIUM CHLORIDE 0.9 % IV SOLN
INTRAVENOUS | Status: AC
  Administered 2016-07-11: 13:00:00 via INTRAVENOUS

## 2016-07-11 MED ORDER — HEPARIN SODIUM (PORCINE) 1000 UNIT/ML IJ SOLN
INTRAMUSCULAR | Status: DC | PRN
  Administered 2016-07-11: 3000 [IU] via INTRAVENOUS

## 2016-07-11 MED ORDER — IOPAMIDOL (ISOVUE-370) INJECTION 76%
INTRAVENOUS | Status: DC | PRN
  Administered 2016-07-11: 100 mL via INTRA_ARTERIAL

## 2016-07-11 MED ORDER — HEPARIN (PORCINE) IN NACL 2-0.9 UNIT/ML-% IJ SOLN
INTRAMUSCULAR | Status: DC | PRN
  Administered 2016-07-11: 1000 mL via INTRA_ARTERIAL

## 2016-07-11 MED ORDER — SODIUM CHLORIDE 0.9 % WEIGHT BASED INFUSION
3.0000 mL/kg/h | INTRAVENOUS | Status: DC
  Administered 2016-07-11: 3 mL/kg/h via INTRAVENOUS

## 2016-07-11 MED ORDER — MIDAZOLAM HCL 2 MG/2ML IJ SOLN
INTRAMUSCULAR | Status: AC
  Filled 2016-07-11: qty 2

## 2016-07-11 MED ORDER — LIDOCAINE HCL (PF) 1 % IJ SOLN
INTRAMUSCULAR | Status: AC
  Filled 2016-07-11: qty 30

## 2016-07-11 MED ORDER — SODIUM CHLORIDE 0.9 % IV SOLN: 250.0000 mL | INTRAVENOUS | Status: DC | PRN

## 2016-07-11 MED ORDER — IOPAMIDOL (ISOVUE-370) INJECTION 76%
INTRAVENOUS | Status: AC
  Filled 2016-07-11: qty 100

## 2016-07-11 MED ORDER — SODIUM CHLORIDE 0.9% FLUSH: 3.0000 mL | INTRAVENOUS | Status: DC | PRN

## 2016-07-11 MED ORDER — POTASSIUM CHLORIDE CRYS ER 20 MEQ PO TBCR: 40.0000 meq | EXTENDED_RELEASE_TABLET | Freq: Once | ORAL | Status: DC

## 2016-07-11 MED ORDER — SODIUM CHLORIDE 0.9 % WEIGHT BASED INFUSION: 1.0000 mL/kg/h | INTRAVENOUS | Status: DC

## 2016-07-11 MED ORDER — FENTANYL CITRATE (PF) 100 MCG/2ML IJ SOLN
INTRAMUSCULAR | Status: AC
  Filled 2016-07-11: qty 2

## 2016-07-11 MED ORDER — LIDOCAINE HCL (PF) 1 % IJ SOLN
INTRAMUSCULAR | Status: DC | PRN
  Administered 2016-07-11: 2 mL

## 2016-07-11 MED ORDER — HEPARIN SODIUM (PORCINE) 1000 UNIT/ML IJ SOLN
INTRAMUSCULAR | Status: AC
  Filled 2016-07-11: qty 1

## 2016-07-11 MED ORDER — VERAPAMIL HCL 2.5 MG/ML IV SOLN
INTRAVENOUS | Status: DC | PRN
  Administered 2016-07-11: 10 mL via INTRA_ARTERIAL

## 2016-07-11 MED ORDER — SODIUM CHLORIDE 0.9% FLUSH
3.0000 mL | Freq: Two times a day (BID) | INTRAVENOUS | Status: DC
  Administered 2016-07-11: 3 mL via INTRAVENOUS

## 2016-07-11 MED ORDER — HEPARIN (PORCINE) IN NACL 2-0.9 UNIT/ML-% IJ SOLN
INTRAMUSCULAR | Status: AC
  Filled 2016-07-11: qty 1000

## 2016-07-11 MED ORDER — CARVEDILOL 6.25 MG PO TABS
6.2500 mg | ORAL_TABLET | Freq: Two times a day (BID) | ORAL | Status: DC
Start: 2016-07-11 — End: 2016-07-14
  Administered 2016-07-11 – 2016-07-14 (×6): 6.25 mg via ORAL
  Filled 2016-07-11 (×6): qty 1

## 2016-07-11 MED ORDER — ASPIRIN 81 MG PO CHEW: 81.0000 mg | CHEWABLE_TABLET | ORAL | Status: DC

## 2016-07-11 MED ORDER — MIDAZOLAM HCL 2 MG/2ML IJ SOLN
INTRAMUSCULAR | Status: DC | PRN
  Administered 2016-07-11 (×2): 1 mg via INTRAVENOUS

## 2016-07-11 MED ORDER — ATORVASTATIN CALCIUM 80 MG PO TABS
80.0000 mg | ORAL_TABLET | Freq: Every day | ORAL | Status: DC
  Administered 2016-07-11 – 2016-07-13 (×3): 80 mg via ORAL
  Filled 2016-07-11 (×3): qty 1

## 2016-07-11 MED ORDER — VERAPAMIL HCL 2.5 MG/ML IV SOLN
INTRAVENOUS | Status: AC
  Filled 2016-07-11: qty 2

## 2016-07-11 MED ORDER — IOPAMIDOL (ISOVUE-370) INJECTION 76%
INTRAVENOUS | Status: AC
Start: 2016-07-11 — End: 2016-07-11
  Filled 2016-07-11: qty 100

## 2016-07-11 MED ORDER — FENTANYL CITRATE (PF) 100 MCG/2ML IJ SOLN
INTRAMUSCULAR | Status: DC | PRN
  Administered 2016-07-11 (×4): 25 ug via INTRAVENOUS

## 2016-07-11 MED ORDER — SODIUM CHLORIDE 0.9 % IV SOLN
250.0000 mL | INTRAVENOUS | Status: DC | PRN
  Administered 2016-07-14: 16:00:00 via INTRAVENOUS

## 2016-07-11 MED ORDER — SODIUM CHLORIDE 0.9% FLUSH
3.0000 mL | Freq: Two times a day (BID) | INTRAVENOUS | Status: DC
  Administered 2016-07-12 – 2016-07-14 (×5): 3 mL via INTRAVENOUS

## 2016-07-11 MED ORDER — HEPARIN (PORCINE) IN NACL 2-0.9 UNIT/ML-% IJ SOLN
INTRAMUSCULAR | Status: DC | PRN
Start: 2016-07-11 — End: 2016-07-11
  Administered 2016-07-11: 1000 mL

## 2016-07-11 MED ORDER — ASPIRIN 81 MG PO CHEW
81.0000 mg | CHEWABLE_TABLET | ORAL | Status: AC
Start: 2016-07-11 — End: 2016-07-11
  Administered 2016-07-11: 81 mg via ORAL
  Filled 2016-07-11: qty 1

## 2016-07-11 MED ORDER — SODIUM CHLORIDE 0.9 % WEIGHT BASED INFUSION: 3.0000 mL/kg/h | INTRAVENOUS | Status: DC

## 2016-07-11 MED ORDER — POTASSIUM CHLORIDE CRYS ER 20 MEQ PO TBCR
40.0000 meq | EXTENDED_RELEASE_TABLET | Freq: Once | ORAL | Status: AC
  Administered 2016-07-11: 40 meq via ORAL
  Filled 2016-07-11: qty 2

## 2016-07-11 MED FILL — Lidocaine HCl Local Preservative Free (PF) Inj 1%: INTRAMUSCULAR | Qty: 30 | Status: AC

## 2016-07-11 SURGICAL SUPPLY — 13 items
CATH INFINITI 5 FR JL3.5 (CATHETERS) ×1 IMPLANT
CATH INFINITI 5FR AL1 (CATHETERS) ×1 IMPLANT
CATH INFINITI JR4 5F (CATHETERS) ×1 IMPLANT
CATH LAUNCHER 5F EBU3.0 (CATHETERS) IMPLANT
CATHETER LAUNCHER 5F EBU3.0 (CATHETERS) ×2
DEVICE RAD COMP TR BAND LRG (VASCULAR PRODUCTS) ×1 IMPLANT
GLIDESHEATH SLEND SS 6F .021 (SHEATH) ×1 IMPLANT
KIT HEART LEFT (KITS) ×2 IMPLANT
PACK CARDIAC CATHETERIZATION (CUSTOM PROCEDURE TRAY) ×2 IMPLANT
TRANSDUCER W/STOPCOCK (MISCELLANEOUS) ×2 IMPLANT
TUBING CIL FLEX 10 FLL-RA (TUBING) ×2 IMPLANT
WIRE HI TORQ VERSACORE-J 145CM (WIRE) ×1 IMPLANT
WIRE SAFE-T 1.5MM-J .035X260CM (WIRE) ×1 IMPLANT

## 2016-07-11 NOTE — Progress Notes (Signed)
[facility] for cardiology to perform heart cath today.  Pending this study will plan By pass surgery per Dr. Trula Slade.    No change in PE CC: left LE rest pain.  Yanin Muhlestein MAUREEN PA-C

## 2016-07-11 NOTE — Interval H&P Note (Signed)
History and Physical Interval Note:  07/11/2016 11:26 AM  Wendy Townsend  has presented today for cardiac catheterization, with the diagnosis of abnormal stress test and upcoming vascular surgery with critical limb ischemia.  The various methods of treatment have been discussed with the patient and family. After consideration of risks, benefits and other options for treatment, the patient has consented to  Procedure(s): Left Heart Cath and Coronary Angiography (N/A) as a surgical intervention .  The patient's history has been reviewed, patient examined, no change in status, stable for surgery.  I have reviewed the patient's chart and labs.  Questions were answered to the patient's satisfaction.    Cath Lab Visit (complete for each Cath Lab visit)  Clinical Evaluation Leading to the Procedure:   ACS: No.  Non-ACS:    Anginal Classification: CCS III (shortness of breath with minimal activity)  Anti-ischemic medical therapy: Minimal Therapy (1 class of medications)  Non-Invasive Test Results: Intermediate-risk stress test findings: cardiac mortality 1-3%/year  Prior CABG: No previous CABG        Wendy Townsend

## 2016-07-11 NOTE — Progress Notes (Signed)
Pt's TR band has been removed from right radial site. Site is a level 0. No bruising or bleeding noted. Tegaderm and gauze dressing applied. Pt resting in bed at this time. Will continue current plan of care.   Grant Fontana BSN, RN

## 2016-07-11 NOTE — H&P (View-Only) (Signed)
Patient Name: Wendy Townsend Date of Encounter: 07/10/2016  Primary Cardiologist: Dr. Antonieta Loveless Problem List     Principal Problem:   Dry gangrene [facility]) Active Problems:   Hypertension   Hyperlipidemia   Tobacco use disorder   Ischemic pain of left foot   PVD (peripheral vascular disease) (HCC)   Hyperglycemia   Acute kidney injury superimposed on chronic kidney disease (HCC)   Protein-calorie malnutrition, severe     Patient Profile     Wendy Townsend is a 65 y.o.female history of HTN, HL, prior CVA, PAD, tobacco abuse, admitted with left foot pain and ulcer. She is being considered for aortobifemoral bypass grafting. She has an extensive PAD history but no known CAD history or clinical events. Her daily exertion is limited primarily by her chronic leg pain. She can have some occasional SOB with activities. Denies any significant chest pain. NST 10/22 intermediate risk with Stress perfusion defect present of the mid inferior wall extending to the base and also laterally compatible with inducible ischemia with pharmacologic stress.  Subjective   Doing ok at rest. No chest pain.     Inpatient Medications    Scheduled Meds: . aspirin EC  81 mg Oral Daily  . cloNIDine  0.2 mg Oral BID  . enoxaparin (LOVENOX) injection  40 mg Subcutaneous QHS  . feeding supplement  1 Container Oral TID BM  . feeding supplement (PRO-STAT SUGAR FREE 64)  30 mL Oral BID  . nicotine  7 mg Transdermal Daily  . regadenoson      . saccharomyces boulardii  250 mg Oral BID  . simvastatin  40 mg Oral q1800   Continuous Infusions: . sodium chloride 100 mL/hr at 07/09/16 1940  . sodium chloride 75 mL/hr at 07/08/16 0629  . lactated ringers 50 mL/hr at 07/06/16 1956   PRN Meds: acetaminophen **OR** acetaminophen, hydrALAZINE, hydrALAZINE, labetalol, metoprolol, morphine injection, ondansetron **OR** ondansetron (ZOFRAN) IV, senna-docusate   Vital Signs    Vitals:   07/10/16 1149  07/10/16 1206 07/10/16 1207 07/10/16 1209  BP: 132/72 129/72 133/71 (!) 143/65  Pulse:      Resp:      Temp:      TempSrc:      SpO2:      Weight:      Height:        Intake/Output Summary (Last 24 hours) at 07/10/16 1444 Last data filed at 07/10/16 0730  Gross per 24 hour  Intake              540 ml  Output                0 ml  Net              540 ml   Filed Weights   07/05/16 2323  Weight: 128 lb 6.4 oz (58.2 kg)    Physical Exam   GEN:  in no acute distress, thin appearing  HEENT: Grossly normal.  Neck: Supple, no JVD, carotid bruits, or masses. Cardiac: RRR, no murmurs, rubs, or gallops. No clubbing, cyanosis, edema.  Radials/DP/PT 2+ and equal bilaterally.  Respiratory:  Respirations regular and unlabored, clear to auscultation bilaterally. GI: Soft, nontender, nondistended, BS + x 4. MS: no deformity or atrophy. Skin: warm and dry, no rash. Neuro:  Strength and sensation are intact. Psych: AAOx3.  Normal affect.  Labs    CBC No results for input(s): WBC, NEUTROABS, HGB, HCT, MCV, PLT in the last 72 hours.  Basic Metabolic Panel  Recent Labs  07/08/16 0536  NA 138  K 3.5  CL 106  CO2 24  GLUCOSE 108*  BUN 19  CREATININE 1.05*  CALCIUM 9.0   Liver Function Tests No results for input(s): AST, ALT, ALKPHOS, BILITOT, PROT, ALBUMIN in the last 72 hours. No results for input(s): LIPASE, AMYLASE in the last 72 hours. Cardiac Enzymes No results for input(s): CKTOTAL, CKMB, CKMBINDEX, TROPONINI in the last 72 hours. BNP Invalid input(s): POCBNP D-Dimer No results for input(s): DDIMER in the last 72 hours. Hemoglobin A1C  Recent Labs  07/08/16 0536  HGBA1C 6.2*   Fasting Lipid Panel No results for input(s): CHOL, HDL, LDLCALC, TRIG, CHOLHDL, LDLDIRECT in the last 72 hours. Thyroid Function Tests No results for input(s): TSH, T4TOTAL, T3FREE, THYROIDAB in the last 72 hours.  Invalid input(s): FREET3  Telemetry    NSR.   ECG    NSR -  Personally Reviewed  Radiology    Nm Myocar Multi W/spect W/wall Motion / Ef  Result Date: 07/10/2016 CLINICAL DATA:  Chest pain and shortness of breath EXAM: MYOCARDIAL IMAGING WITH SPECT (REST AND PHARMACOLOGIC-STRESS) GATED LEFT VENTRICULAR WALL MOTION STUDY LEFT VENTRICULAR EJECTION FRACTION TECHNIQUE: Standard myocardial SPECT imaging was performed after resting intravenous injection of 10 mCi Tc-18m tetrofosmin. Subsequently, intravenous infusion of Lexiscan was performed under the supervision of the Cardiology staff. At peak effect of the drug, 30 mCi Tc-2m tetrofosmin was injected intravenously and standard myocardial SPECT imaging was performed. Quantitative gated imaging was also performed to evaluate left ventricular wall motion, and estimate left ventricular ejection fraction. COMPARISON:  None available FINDINGS: Perfusion: Stress perfusion defect present of the mid inferior wall extending to the base and also laterally compatible with inducible ischemia with pharmacologic stress. Wall Motion: Normal left ventricular wall motion. No left ventricular dilation. Left Ventricular Ejection Fraction: 73 % End diastolic volume 41 ml End systolic volume 11 ml IMPRESSION: 1. Positive exam for inferior inducible ischemia extending laterally. 2. Normal left ventricular wall motion. 3. Left ventricular ejection fraction 73% 4. Non invasive risk stratification*: Intermediate to high *2012 Appropriate Use Criteria for Coronary Revascularization Focused Update: J Am Coll Cardiol. 1607;37(1):062-694. http://content.[email or web address].aspx?articleid=[email or web address] These results will be called to the ordering clinician or representative by the Radiologist Assistant, and communication documented in the PACS or zVision Dashboard. Electronically Signed   By: Jerilynn Mages.  Shick M.D.   On: 07/10/2016 13:32    Cardiac Studies   Stress test 07/10/16 (see above)   Patient Profile     Wendy Townsend is a 65 y.o.female history  of HTN, HL, prior CVA, PAD, tobacco abuse, admitted with left foot pain and ulcer. She is being considered for aortobifemoral bypass grafting. She has an extensive PAD history but no known CAD history or clinical events. Her daily exertion is limited primarily by her chronic leg pain. She can have some occasional SOB with activities. Denies any significant chest pain.    Assessment & Plan    Principal Problem:   Dry gangrene ([place]) Active Problems:   Hypertension   Hyperlipidemia   Tobacco use disorder   Ischemic pain of left foot   PVD (peripheral vascular disease) (HCC)   Hyperglycemia   Acute kidney injury superimposed on chronic kidney disease (HCC)   Protein-calorie malnutrition, severe   1. Abnormal Stress Test: conducted as part of pre-operative w/u. Study showed stress perfusion defect present of the mid inferior wall extending to the base and also laterally compatible with inducible ischemia with pharmacologic  stress. Given her severe PVD and stress findings, she likely has coronary disease and will need LHC. However we will need to consider possible need for Plavix if intervention is performed. This would require at least 1-3 months of antiplatelet therapy if PCI, which would delay surgery for ischemic lower extremity. MD to assess and will further advise.    Signed, Lyda Jester, PA-C  07/10/2016, 2:44 PM   Attending Note:   The patient was seen and examined.  Agree with assessment and plan as noted above.  Changes made to the above note as needed.  Patient seen and independently examined with Lyda Jester, PA .   We discussed all aspects of the encounter. I agree with the assessment and plan as stated above.  I have reviewed the images from the [facility] and the myoview images from 2009  The inferior defect could be artifact but since she is having major vascular surgery, we need to know for sure. I would suggest that we do a left heart cath for further  evaluation  Her LV function is well preserved.    I have spent a total of 40 minutes with patient reviewing hospital  notes , telemetry, EKGs, labs and examining patient as well as establishing an assessment and plan that was discussed with the patient. > 50% of time was spent in direct patient care.    Thayer Headings, Brooke Bonito., MD, [facility] 07/10/2016, 7:04 PM [address],  [place] Pager [phone]

## 2016-07-11 NOTE — Progress Notes (Signed)
[facility] PROGRESS NOTE  Aaralyn Kil EXH:[id number] DOB: 17-Jan-1951 DOA: 07/05/2016 PCP: Gildardo Cranker, DO  Interim summary and HPI 65 y.o. female with medical history significant of CVA, PVD, HTN, HLD, tobacco abuse presenting with a necrotic toe.  She splashed hot grease on her left second toe in early September and was seen at [facility] on September 9.   She was diagnosed with a paronychia with possible abscess, xray negative for osteomyelitis. She was given Tramadol and Doxy x 10 days.  At that time, her foot did not feel cold and a DP pulse was found with the Doppler.  Sensation was noted to be intact.    She established care with Dr. Eulas Post on 9/20.  She was diagnosed with cellulitis of the left second toe and given Levaquin 750 mg x 10 days; a wound culture was obtained and she was referred (again) to podiatry.    Now can hardly walk on foot.  It has worsened over the last 2 weeks.  +pain.  Denies drainage.  Assessment/Plan: Dry gangrene with PVD/ischemic pain of the left foot -Patient with apparent gangrene of the left 2nd toe -Left foot cold, without palpable pulses, ABIs appear to indicate markedly diminished flow to the affected extremity. -Vascular surgery consulted; will follow rec's; status post arteriogram. Will need bypass surgery and attempt for revascularization. looking for arteriogram later today -It seems extremely likely that she will lose the toe if not the whole foot; but before determining that, will focus first on revascularization option to guaranteed healing. -waiting cardiology clearance to be finalize  -continue PRN analgesic regimen  -Diabetic foot order set utilized, but without antibiotics since there is no current evidence of infection appreciated  Surgery clearance -she will be high risk due to extensive PAD -cardiology consulted, will follow rec's -Non-invasive stress test demonstrated intermediate high risk -echo w/o wall motion abnormalities,  preserved EF and demonstrating grade 1 diastolic HF -planning to perform LHC today 10/23 to finalize clearance evaluation   HTN -Home meds include Clonidine, HCTZ, Lisinopril -will continue Holding HCTZ and Lisinopril due to presentation of acute renal dysfunction and the fact her BP is stable w/o it. Patient also with plans for further future use of contrast. -Will give prn IV Hydralazine in case of poor BP control -continue clonidine   HLD -Recent lipid profile (06/08/16) shows LDL 32, HDL 53 -continue Simvastatin 40 mg daily  Tobacco dependence -Encourage cessation.  This was discussed with the patient  -Patch ordered. -patient looking to quit; sounds motivated and understood importance to quit for her condition   AKI on CKD: stage 3 at baseline  -most Recent creatinine 1.05 (back to baseline) -will continue holding nephrotoxic agents (HCTZ and ACE) -repeated BMET this morning pending   Hyperglycemia -A1C 6.2 -will monitor CBG's intermittently and if > 200 will start SSI  Protein calorie malnutrition: severe -nutrition supplements as per nutritional service recommendations   Code Status: Full Family Communication: daughter at bedside  Disposition Plan: to be determine by vascular surgery; anticipate need for at least toe amputation. Per vascular note will like to have cardiology clearance before proceeding with bypass surgery and revascularization. Abnormal stress test and planning LHC today 10/23   Consultants:  Vascular surgery   Cardiology   Procedures:  See below for x-ray reports   arteriogram 10/20: with severe PAD affecting especially her left limb.  2-D echo:  - Left ventricle: The cavity size was normal. Wall thickness was   increased in a pattern of  mild LVH. Systolic function was   vigorous. The estimated ejection fraction was in the range of 65%   to 70%. Wall motion was normal; there were no regional wall   motion abnormalities. Doppler  parameters are consistent with   abnormal left ventricular relaxation (grade 1 diastolic   dysfunction).   Non-invasive stress test:  1. Positive exam for inferior inducible ischemia extending laterally. 2. Normal left ventricular wall motion. 3. Left ventricular ejection fraction 73% 4. Non invasive risk stratification*: Intermediate to high   Left heart cath:planned for today 10/23  Antibiotics:  None   HPI/Subjective: Afebrile, in no distress. Denies CP and SOB. Patient with intermittent pain in her left leg and foot. Reports been ready for LHC.  Objective: Vitals:   07/10/16 2015 07/11/16 0537  BP: (!) 145/78 (!) 146/83  Pulse: 98 93  Resp: 18 20  Temp: 98.1 F (36.7 C) 97.8 F (36.6 C)    Intake/Output Summary (Last 24 hours) at 07/11/16 0804 Last data filed at 07/11/16 0015  Gross per 24 hour  Intake              480 ml  Output              502 ml  Net              -22 ml   Filed Weights   07/05/16 2323  Weight: 58.2 kg (128 lb 6.4 oz)    Exam:   General:  Afebrile, non-toxic in appearance. Patient denies CP and SOB. Endorses she feel blessed. Ready for cath today. continue to experienced some intermittent pain in her left leg and foot.  Cardiovascular: S1 and S2, no rubs, no gallops  Respiratory: CTA bilaterally   Abdomen: soft, NT, ND, positive BS  Musculoskeletal: no edema, positive left 2nd toe gangrene appreciated; decrease pedal pulses bilaterally (L > R)  Data Reviewed: Basic Metabolic Panel:  Recent Labs Lab 07/05/16 2015 07/06/16 0639 07/07/16 0531 07/08/16 0536  NA 136 135 139 138  K 4.0 3.6 3.9 3.5  CL 100* 104 104 106  CO2 22 21* 25 24  GLUCOSE 112* 119* 95 108*  BUN 28* 25* 23* 19  CREATININE 1.80* 1.47* 1.17* 1.05*  CALCIUM 10.1 9.2 9.2 9.0   CBC:  Recent Labs Lab 07/05/16 2015 07/06/16 0639 07/07/16 0531  WBC 9.4 9.3 7.4  NEUTROABS 6.2  --   --   HGB 13.0 11.0* 11.0*  HCT 38.0 31.9* 32.5*  MCV 90.3 88.9 89.5   PLT 315 283 288   CBG: No results for input(s): GLUCAP in the last 168 hours.   Studies: Nm Myocar Multi W/spect W/wall Motion / Ef  Result Date: 07/10/2016 CLINICAL DATA:  Chest pain and shortness of breath EXAM: MYOCARDIAL IMAGING WITH SPECT (REST AND PHARMACOLOGIC-STRESS) GATED LEFT VENTRICULAR WALL MOTION STUDY LEFT VENTRICULAR EJECTION FRACTION TECHNIQUE: Standard myocardial SPECT imaging was performed after resting intravenous injection of 10 mCi Tc-68m tetrofosmin. Subsequently, intravenous infusion of Lexiscan was performed under the supervision of the Cardiology staff. At peak effect of the drug, 30 mCi Tc-66m tetrofosmin was injected intravenously and standard myocardial SPECT imaging was performed. Quantitative gated imaging was also performed to evaluate left ventricular wall motion, and estimate left ventricular ejection fraction. COMPARISON:  None available FINDINGS: Perfusion: Stress perfusion defect present of the mid inferior wall extending to the base and also laterally compatible with inducible ischemia with pharmacologic stress. Wall Motion: Normal left ventricular wall motion. No left ventricular dilation.  Left Ventricular Ejection Fraction: 73 % End diastolic volume 41 ml End systolic volume 11 ml IMPRESSION: 1. Positive exam for inferior inducible ischemia extending laterally. 2. Normal left ventricular wall motion. 3. Left ventricular ejection fraction 73% 4. Non invasive risk stratification*: Intermediate to high *2012 Appropriate Use Criteria for Coronary Revascularization Focused Update: J Am Coll Cardiol. 4401;02(7):253-664. http://content.[email or web address]?articleid=[email or web address] These results will be called to the ordering clinician or representative by the Radiologist Assistant, and communication documented in the PACS or zVision Dashboard. Electronically Signed   By: Jerilynn Mages.  Shick M.D.   On: 07/10/2016 13:32    Scheduled Meds: . aspirin EC  81 mg Oral Daily  .  cloNIDine  0.2 mg Oral BID  . enoxaparin (LOVENOX) injection  40 mg Subcutaneous QHS  . feeding supplement  1 Container Oral TID BM  . feeding supplement (PRO-STAT SUGAR FREE 64)  30 mL Oral BID  . nicotine  7 mg Transdermal Daily  . saccharomyces boulardii  250 mg Oral BID  . simvastatin  40 mg Oral q1800  . sodium chloride flush  3 mL Intravenous Q12H   Continuous Infusions: . sodium chloride 1 mL/kg/hr (07/11/16 0653)  . lactated ringers 50 mL/hr at 07/06/16 1956    Principal Problem:   Dry gangrene ([place]) Active Problems:   Hypertension   Hyperlipidemia   Tobacco use disorder   Ischemic pain of left foot   PVD (peripheral vascular disease) ([place])   Hyperglycemia   Acute kidney injury superimposed on chronic kidney disease ([place])   Protein-calorie malnutrition, severe    Time spent: 25 minutes    Barton Dubois  [facility] Pager [phone]. If 7PM-7AM, please contact night-coverage at [email or web address], password [facility] 07/11/2016, 8:04 AM  LOS: 6 days

## 2016-07-11 NOTE — Research (Signed)
CADLAD Informed Consent   Subject Name: Tikita Mabee  Subject met inclusion and exclusion criteria.  The informed consent form, study requirements and expectations were reviewed with the subject and questions and concerns were addressed prior to the signing of the consent form.  The subject verbalized understanding of the trail requirements.  The subject agreed to participate in the CADLAD trial and signed the informed consent.  The informed consent was obtained prior to performance of any protocol-specific procedures for the subject.  A copy of the signed informed consent was given to the subject and a copy was placed in the subject's medical record.  Hedrick,Tammy W 07/11/2016, 0745

## 2016-07-12 DIAGNOSIS — R9439 Abnormal result of other cardiovascular function study: Secondary | ICD-10-CM

## 2016-07-12 DIAGNOSIS — I739 Peripheral vascular disease, unspecified: Secondary | ICD-10-CM

## 2016-07-12 LAB — CREATININE, SERUM
CREATININE: 0.87 mg/dL (ref 0.44–1.00)
GFR calc Af Amer: >60 mL/min (ref >60)

## 2016-07-12 MED ORDER — CLONIDINE HCL 0.1 MG PO TABS
0.1000 mg | ORAL_TABLET | Freq: Two times a day (BID) | ORAL | Status: DC
Start: 2016-07-12 — End: 2016-07-14
  Administered 2016-07-12 – 2016-07-14 (×5): 0.1 mg via ORAL
  Filled 2016-07-12 (×5): qty 1

## 2016-07-12 MED ORDER — MORPHINE SULFATE (PF) 2 MG/ML IV SOLN: 2.0000 mg | INTRAVENOUS | Status: DC | PRN

## 2016-07-12 NOTE — Progress Notes (Signed)
[facility] PROGRESS NOTE  Angeliyah Kirkey EHM:[id number] DOB: December 12, 1950 DOA: 07/05/2016 PCP: Gildardo Cranker, DO  Interim summary and HPI 65 y.o. female with medical history significant of CVA, PVD, HTN, HLD, tobacco abuse presenting with a necrotic toe.  She splashed hot grease on her left second toe in early September and was seen at [facility] on September 9.   She was diagnosed with a paronychia with possible abscess, xray negative for osteomyelitis. She was given Tramadol and Doxy x 10 days.  At that time, her foot did not feel cold and a DP pulse was found with the Doppler.  Sensation was noted to be intact.    She established care with Dr. Eulas Post on 9/20.  She was diagnosed with cellulitis of the left second toe and given Levaquin 750 mg x 10 days; a wound culture was obtained and she was referred (again) to podiatry.    Now can hardly walk on foot.  It has worsened over the last 2 weeks.  +pain.  Denies drainage.  Assessment/Plan: Dry gangrene with PVD/ischemic pain of the left foot -Patient with apparent gangrene of the left 2nd toe -Left foot cold, without palpable pulses, ABIs appear to indicate markedly diminished flow to the affected extremity. -Vascular surgery consulted; will follow rec's; status post arteriogram. Will need bypass surgery and attempt for revascularization. looking for arteriogram later today -It seems extremely likely that she will lose the toe if not the whole foot; but before determining that, will focus first on revascularization option to guaranteed healing. -cardiology clearance has now been finalized -will follow vascular surgery rec's  -continue PRN analgesic regimen  -Diabetic foot order set utilized, but without antibiotics since there is no current evidence of infection appreciated  Surgery clearance -she will be high risk due to extensive PAD -will follow rec's from cardiology regarding treatment for secondary atherosclerotic  prevention -Non-invasive stress test demonstrated intermediate high risk -echo w/o wall motion abnormalities, preserved EF and demonstrating grade 1 diastolic HF -nonobstructive CAD appreciated in her [place] 10/23   HTN -Home meds include Clonidine, HCTZ, Lisinopril -will continue Holding HCTZ and Lisinopril due to presentation of acute renal dysfunction and the fact her BP is stable w/o them. Patient also started on coreg BID as per cardiology rec's -Will give prn IV Hydralazine in case of poor BP control -continue clonidine at adjusted dose  HLD -Recent lipid profile (06/08/16) shows LDL 32, HDL 53 -Simvastatin exchanged to lipitor 80 mg daily as per recommendations from cardiology  Tobacco dependence -Encourage cessation.  This was discussed with the patient  -Patch ordered. -patient looking to quit; sounds motivated and understood importance to quit for her condition   AKI on CKD: stage 3 at baseline  -most Recent creatinine 0.87 (back to baseline) -will continue holding nephrotoxic agents (HCTZ and ACE) for now -check BMET intermittently to monitor trend    Hyperglycemia -A1C 6.2 -will monitor CBG's intermittently and if > 200 will start SSI -so far well controlled with modified carb diet   Protein calorie malnutrition: severe -nutrition supplements as per nutritional service recommendations   Code Status: Full Family Communication: daughter at bedside  Disposition Plan: to be determine by vascular surgery; anticipate need for at least toe amputation. Per vascular note will like to have cardiology clearance before proceeding with bypass surgery and revascularization. Heart cath with nonobstructive CAD.   Consultants:  Vascular surgery   Cardiology   Procedures:  See below for x-ray reports   arteriogram 10/20: with severe PAD  affecting especially her left limb.  2-D echo:  - Left ventricle: The cavity size was normal. Wall thickness was   increased in a  pattern of mild LVH. Systolic function was   vigorous. The estimated ejection fraction was in the range of 65%   to 70%. Wall motion was normal; there were no regional wall   motion abnormalities. Doppler parameters are consistent with   abnormal left ventricular relaxation (grade 1 diastolic   dysfunction).   Non-invasive stress test:  1. Positive exam for inferior inducible ischemia extending laterally. 2. Normal left ventricular wall motion. 3. Left ventricular ejection fraction 73% 4. Non invasive risk stratification*: Intermediate to high   Left heart cath:planned for today 10/23 Conclusions: 1. Mild to moderate non-obstructive coronary artery disease, as detailed below. 2. Normal left ventricular filling pressure. 3. Systemic hypertension  Recommendations: 1. No high-grade stenosis to prevent proceeding with urgent vascular surgery. 2. Continue secondary prevention of atherosclerotic cardiovascular disease. 3. Start standing carvedilol 6.25 mg twice a day, with uptitration as heart rate and blood pressure allow. 4. Switch simvastatin to atorvastatin 80 mg daily.  Antibiotics:  None   HPI/Subjective: Afebrile, in no distress. Denies CP and SOB. Patient continue to experienced intermittent pain in her left leg and foot.   Objective: Vitals:   07/12/16 0512 07/12/16 0811  BP: (!) 101/54 (!) 141/94  Pulse: 81   Resp: 20   Temp: 98 F (36.7 C)    No intake or output data in the 24 hours ending 07/12/16 0816 Filed Weights   07/05/16 2323  Weight: 58.2 kg (128 lb 6.4 oz)    Exam:   General:  Afebrile, non-toxic in appearance. Patient denies CP and SOB. Endorses no acute distress or major complaints. Continue to experienced some intermittent pain in her left leg and foot, but well controlled with pain meds..  Cardiovascular: S1 and S2, no rubs, no gallops  Respiratory: CTA bilaterally   Abdomen: soft, NT, ND, positive BS  Musculoskeletal: no edema,  positive left 2nd toe gangrene appreciated; decrease pedal pulses bilaterally (L > R)  Data Reviewed: Basic Metabolic Panel:  Recent Labs Lab 07/05/16 2015 07/06/16 0639 07/07/16 0531 07/08/16 0536 07/11/16 0724 07/12/16 0443  NA 136 135 139 138 139  --   K 4.0 3.6 3.9 3.5 3.1*  --   CL 100* 104 104 106 107  --   CO2 22 21* 25 24 23   --   GLUCOSE 112* 119* 95 108* 101*  --   BUN 28* 25* 23* 19 17  --   CREATININE 1.80* 1.47* 1.17* 1.05* 0.99 0.87  CALCIUM 10.1 9.2 9.2 9.0 8.6*  --    CBC:  Recent Labs Lab 07/05/16 2015 07/06/16 0639 07/07/16 0531 07/11/16 0724  WBC 9.4 9.3 7.4 6.9  NEUTROABS 6.2  --   --   --   HGB 13.0 11.0* 11.0* 10.1*  HCT 38.0 31.9* 32.5* 30.0*  MCV 90.3 88.9 89.5 89.6  PLT 315 283 288 284   CBG: No results for input(s): GLUCAP in the last 168 hours.   Studies: Nm Myocar Multi W/spect W/wall Motion / Ef  Result Date: 07/10/2016 CLINICAL DATA:  Chest pain and shortness of breath EXAM: MYOCARDIAL IMAGING WITH SPECT (REST AND PHARMACOLOGIC-STRESS) GATED LEFT VENTRICULAR WALL MOTION STUDY LEFT VENTRICULAR EJECTION FRACTION TECHNIQUE: Standard myocardial SPECT imaging was performed after resting intravenous injection of 10 mCi Tc-25m tetrofosmin. Subsequently, intravenous infusion of Lexiscan was performed under the supervision of the Cardiology staff.  At peak effect of the drug, 30 mCi Tc-16m tetrofosmin was injected intravenously and standard myocardial SPECT imaging was performed. Quantitative gated imaging was also performed to evaluate left ventricular wall motion, and estimate left ventricular ejection fraction. COMPARISON:  None available FINDINGS: Perfusion: Stress perfusion defect present of the mid inferior wall extending to the base and also laterally compatible with inducible ischemia with pharmacologic stress. Wall Motion: Normal left ventricular wall motion. No left ventricular dilation. Left Ventricular Ejection Fraction: 73 % End diastolic  volume 41 ml End systolic volume 11 ml IMPRESSION: 1. Positive exam for inferior inducible ischemia extending laterally. 2. Normal left ventricular wall motion. 3. Left ventricular ejection fraction 73% 4. Non invasive risk stratification*: Intermediate to high *2012 Appropriate Use Criteria for Coronary Revascularization Focused Update: J Am Coll Cardiol. 1308;65(7):846-962. http://content.[email or web address].aspx?articleid=[email or web address] These results will be called to the ordering clinician or representative by the Radiologist Assistant, and communication documented in the PACS or zVision Dashboard. Electronically Signed   By: Jerilynn Mages.  Shick M.D.   On: 07/10/2016 13:32    Scheduled Meds: . aspirin EC  81 mg Oral Daily  . atorvastatin  80 mg Oral q1800  . carvedilol  6.25 mg Oral BID WC  . cloNIDine  0.1 mg Oral BID  . enoxaparin (LOVENOX) injection  40 mg Subcutaneous QHS  . feeding supplement  1 Container Oral TID BM  . feeding supplement (PRO-STAT SUGAR FREE 64)  30 mL Oral BID  . nicotine  7 mg Transdermal Daily  . potassium chloride  40 mEq Oral Once  . saccharomyces boulardii  250 mg Oral BID  . sodium chloride flush  3 mL Intravenous Q12H   Continuous Infusions: . lactated ringers 50 mL/hr at 07/06/16 1956    Principal Problem:   Dry gangrene ([place]) Active Problems:   Hypertension   Hyperlipidemia   Tobacco use disorder   Ischemic pain of left foot   PVD (peripheral vascular disease) (HCC)   Hyperglycemia   Acute kidney injury superimposed on chronic kidney disease ([place])   Protein-calorie malnutrition, severe   Abnormal stress test   Dyspnea    Time spent: 25 minutes    Barton Dubois  [facility] Pager [phone]. If 7PM-7AM, please contact night-coverage at [email or web address], password [email or web address] 07/12/2016, 8:16 AM  LOS: 7 days

## 2016-07-12 NOTE — Care Management Important Message (Signed)
Important Message  Patient Details  Name: Wendy Townsend MRN: [id number] Date of Birth: [birth year]   Medicare Important Message Given:  Yes    [name] 07/12/2016, 12:53 PM

## 2016-07-12 NOTE — Care Management Note (Signed)
Case Management Note  Patient Details  Name: Wendy Townsend MRN: [id number] Date of Birth: 1950-10-27  Subjective/Objective:   Pt admitted with dry gangrene/ishemic foot                 Action/Plan: PTA pt lived at home with brother, nephew and occasionally niece- pt needs bypass plan for OR on this admission- CM to follow post op for d/c needs  Expected Discharge Date:                  Expected Discharge Plan:  [place]  In-House Referral:     Discharge planning Services  CM Consult  Post Acute Care Choice:    Choice offered to:     DME Arranged:    DME Agency:     HH Arranged:    [place] Agency:     Status of Service:  In process, will continue to follow  If discussed at Long Length of Stay Meetings, dates discussed:    Additional Comments:  Dawayne Patricia, RN 07/12/2016, 9:36 AM

## 2016-07-12 NOTE — Progress Notes (Signed)
Patient Name: Wendy Townsend Date of Encounter: 07/12/2016  Primary Cardiologist: [facility] Problem List     Principal Problem:   Dry gangrene [facility]) Active Problems:   Hypertension   Hyperlipidemia   Tobacco use disorder   Ischemic pain of left foot   PVD (peripheral vascular disease) (HCC)   Hyperglycemia   Acute kidney injury superimposed on chronic kidney disease (HCC)   Protein-calorie malnutrition, severe   Abnormal stress test   Dyspnea    Subjective   Breathing ok, no chest pain. No palpitations, has never felt her heart skip or race  Inpatient Medications    Scheduled Meds: . aspirin EC  81 mg Oral Daily  . atorvastatin  80 mg Oral q1800  . carvedilol  6.25 mg Oral BID WC  . cloNIDine  0.1 mg Oral BID  . enoxaparin (LOVENOX) injection  40 mg Subcutaneous QHS  . feeding supplement  1 Container Oral TID BM  . feeding supplement (PRO-STAT SUGAR FREE 64)  30 mL Oral BID  . nicotine  7 mg Transdermal Daily  . potassium chloride  40 mEq Oral Once  . saccharomyces boulardii  250 mg Oral BID  . sodium chloride flush  3 mL Intravenous Q12H   Continuous Infusions: . lactated ringers 50 mL/hr at 07/06/16 1956   PRN Meds: sodium chloride, acetaminophen **OR** acetaminophen, hydrALAZINE, hydrALAZINE, labetalol, metoprolol, morphine injection, ondansetron **OR** ondansetron (ZOFRAN) IV, senna-docusate, sodium chloride flush   Vital Signs    Vitals:   07/11/16 1753 07/11/16 2009 07/12/16 0512 07/12/16 0811  BP: 109/65 117/65 (!) 101/54 (!) 141/94  Pulse: 80 84 81   Resp:  18 20   Temp:  98.4 F (36.9 C) 98 F (36.7 C)   TempSrc:  Oral Oral   SpO2: 100% 100% 100%   Weight:      Height:       No intake or output data in the 24 hours ending 07/12/16 0933 Filed Weights   07/05/16 2323  Weight: 128 lb 6.4 oz (58.2 kg)    Physical Exam    GEN: Well nourished, well developed, in no acute distress.  HEENT: Grossly normal.  Neck: Supple, no JVD,  carotid bruits, or masses. Cardiac: RRR, soft murmur, no rubs, or gallops. No clubbing, cyanosis, edema.  Radials 2+ and equal bilaterally. R radial cath site without ecchymosis or hematoma Respiratory:  Respirations regular and unlabored, clear to auscultation bilaterally. GI: Soft, nontender, nondistended, BS + x 4. MS: no deformity or atrophy. Skin: warm and dry, no rash. Neuro:  Strength and sensation are intact. Psych: AAOx3.  Normal affect.  Labs    CBC  Recent Labs  07/11/16 0724  WBC 6.9  HGB 10.1*  HCT 30.0*  MCV 89.6  PLT 979   Basic Metabolic Panel  Recent Labs  07/11/16 0724 07/12/16 0443  NA 139  --   K 3.1*  --   CL 107  --   CO2 23  --   GLUCOSE 101*  --   BUN 17  --   CREATININE 0.99 0.87  CALCIUM 8.6*  --    Telemetry    SR, ST w/ 2 brief episodes of ?atrial tach - Personally Reviewed  ECG    n/a - Personally Reviewed  Radiology    Nm Myocar Multi W/spect W/wall Motion / Ef Result Date: 07/10/2016 CLINICAL DATA:  Chest pain and shortness of breath EXAM: MYOCARDIAL IMAGING WITH SPECT (REST AND PHARMACOLOGIC-STRESS) GATED LEFT VENTRICULAR WALL  MOTION STUDY LEFT VENTRICULAR EJECTION FRACTION TECHNIQUE: Standard myocardial SPECT imaging was performed after resting intravenous injection of 10 mCi Tc-45m tetrofosmin. Subsequently, intravenous infusion of Lexiscan was performed under the supervision of the Cardiology staff. At peak effect of the drug, 30 mCi Tc-18m tetrofosmin was injected intravenously and standard myocardial SPECT imaging was performed. Quantitative gated imaging was also performed to evaluate left ventricular wall motion, and estimate left ventricular ejection fraction. COMPARISON:  None available FINDINGS: Perfusion: Stress perfusion defect present of the mid inferior wall extending to the base and also laterally compatible with inducible ischemia with pharmacologic stress. Wall Motion: Normal left ventricular wall motion. No left  ventricular dilation. Left Ventricular Ejection Fraction: 73 % End diastolic volume 41 ml End systolic volume 11 ml IMPRESSION: 1. Positive exam for inferior inducible ischemia extending laterally. 2. Normal left ventricular wall motion. 3. Left ventricular ejection fraction 73% 4. Non invasive risk stratification*: Intermediate to high *2012 Appropriate Use Criteria for Coronary Revascularization Focused Update: J Am Coll Cardiol. 3888;28(0):034-917. http://content.[email or web address].aspx?articleid=[email or web address] These results will be called to the ordering clinician or representative by the Radiologist Assistant, and communication documented in the PACS or zVision Dashboard. Electronically Signed   By: Jerilynn Mages.  Shick M.D.   On: 07/10/2016 13:32    Cardiac Studies   CATH: 10/23 Conclusions: 1. Mild to moderate non-obstructive coronary artery disease, as detailed below. 2. Normal left ventricular filling pressure. 3. Systemic hypertension Recommendations: 1. No high-grade stenosis to prevent proceeding with urgent vascular surgery. 2. Continue secondary prevention of atherosclerotic cardiovascular disease. 3. Start standing carvedilol 6.25 mg twice a day, with uptitration as heart rate and blood pressure allow. 4. Switch simvastatin to atorvastatin 80 mg daily.   Patient Profile     65 y.o.femalewith medical history significant of CVA, PVD, HTN, HLD, tobacco abuse presented with a necrotic toe 10/17. Cards following for preop eval ?CAD   Assessment & Plan    1. Preop eval: pt had abnormal MV>>cath 10/23. - Non-obstructive dz at cath w/ med rx and CRF reduction - LV function preserved - on ASA, high-dose statin and BB - would not do a stress test in the future, false positive  2. Tachycardia, ?atrial tach - new dx, asymptomatic - on BB, BP does not permit a dose increase - follow on telemetry, further eval per MD  Otherwise, per IM/VVS Principal Problem:   Dry gangrene ([place]) Active  Problems:   Hypertension   Hyperlipidemia   Tobacco use disorder   Ischemic pain of left foot   PVD (peripheral vascular disease) (HCC)   Hyperglycemia   Acute kidney injury superimposed on chronic kidney disease ([place])   Protein-calorie malnutrition, severe   Abnormal stress test   Dyspnea    Signed, Rosaria Ferries, PA-C  07/12/2016, 9:33 AM  Patient seen and examined and history reviewed. Agree with above findings and plan. Doing well post cardiac cath yesterday. No radial site hematoma. Short run of PAT yesterday afternoon. Asymptomatic. Based on cardiac cath she is a suitable candidate for surgery for PAD. Would continue Coreg for PAT. We will sign off. Please call with questions.   Peter Martinique, [place] 07/12/2016 10:56 AM

## 2016-07-12 NOTE — Progress Notes (Signed)
Nutrition Follow Up  DOCUMENTATION CODES:   Severe malnutrition in context of acute illness/injury  INTERVENTION:    Continue Boost Breeze po TID, each supplement provides 250 kcal and 9 grams of protein   Continue Prostat 30 ml po BID, each supplement provides 100 kcal and 15 grams of protein  NUTRITION DIAGNOSIS:   Malnutrition related to acute illness as evidenced by percent weight loss, moderate depletion of body fat, moderate depletions of muscle mass, ongoing  GOAL:   Patient will meet greater than or equal to 90% of their needs, progressing  MONITOR:   PO intake, Supplement acceptance, Labs, Weight trends, Skin, I & O's  ASSESSMENT:   65 y.o. female with medical history significant of CVA, PVD, HTN, HLD, tobacco abuse presenting with a necrotic toe.  She splashed hot grease on her left second toe in early September. Now can hardly walk on foot.  It has worsened over the last 2 weeks.   Pt s/p procedure 10/20: ABDOMINAL AORTOGRAM WITH BILATERAL LOWER EXTREMITY RUNOFF  Pt continues on a Heart Healthy/Carbohydrate Modified diet. PO intake variable at 0-100% per flowsheet records. Malnutrition identified on initial assessment and is ongoing. Taking some of her Boost Breeze & Prostat liquid protein. Labs and medications reviewed.  Diet Order:  Diet heart healthy/carb modified Room service appropriate? Yes; Fluid consistency: Thin  Skin:  Wound (see comment) (necrotic L second toe)  Last BM:  10/21  Height:   Ht Readings from Last 1 Encounters:  07/05/16 5\' 4"  (1.626 m)    Weight:   Wt Readings from Last 1 Encounters:  07/05/16 128 lb 6.4 oz (58.2 kg)    Ideal Body Weight:  54.5 kg  BMI:  Body mass index is 22.04 kg/m.  Estimated Nutritional Needs:   Kcal:  1750-2000  Protein:  75-90 grams  Fluid:  1.8 - 2 L/day  EDUCATION NEEDS:   No education needs identified at this time  Arthur Holms, RD, LDN Pager #: [phone] After-Hours Pager #:  [phone]

## 2016-07-12 NOTE — Progress Notes (Signed)
Patient is cleared from a cardiology perspective to proceed with surgery.  I will discuss the details of the operation with the patient tomorrow.  She is tentatively scheduled for surgery this Thursday.  I will check carotid Doppler studies tomorrow  Annamarie Major

## 2016-07-13 ENCOUNTER — Inpatient Hospital Stay (HOSPITAL_COMMUNITY): Payer: Commercial Managed Care - HMO

## 2016-07-13 DIAGNOSIS — N179 Acute kidney failure, unspecified: Secondary | ICD-10-CM

## 2016-07-13 DIAGNOSIS — N183 Chronic kidney disease, stage 3 (moderate): Secondary | ICD-10-CM

## 2016-07-13 DIAGNOSIS — I10 Essential (primary) hypertension: Secondary | ICD-10-CM

## 2016-07-13 DIAGNOSIS — R7302 Impaired glucose tolerance (oral): Secondary | ICD-10-CM

## 2016-07-13 DIAGNOSIS — I999 Unspecified disorder of circulatory system: Secondary | ICD-10-CM

## 2016-07-13 DIAGNOSIS — M79672 Pain in left foot: Secondary | ICD-10-CM

## 2016-07-13 DIAGNOSIS — Z01810 Encounter for preprocedural cardiovascular examination: Secondary | ICD-10-CM

## 2016-07-13 LAB — VAS US CAROTID
LEFT ECA DIAS: -20 cm/s
LEFT VERTEBRAL DIAS: 28 cm/s
Left CCA dist dias: -21 cm/s
Left CCA dist sys: -72 cm/s
Left CCA prox dias: 15 cm/s
Left CCA prox sys: 92 cm/s
Left ICA dist dias: -30 cm/s
Left ICA dist sys: -103 cm/s
Left ICA prox dias: -19 cm/s
Left ICA prox sys: -69 cm/s
RIGHT ECA DIAS: 18 cm/s
RIGHT VERTEBRAL DIAS: 16 cm/s
Right CCA prox dias: 14 cm/s
Right CCA prox sys: 75 cm/s
Right cca dist sys: -60 cm/s

## 2016-07-13 LAB — BASIC METABOLIC PANEL WITH GFR
Anion gap: 10 (ref 5–15)
BUN: 17 mg/dL (ref 6–20)
CO2: 23 mmol/L (ref 22–32)
Calcium: 9 mg/dL (ref 8.9–10.3)
Chloride: 107 mmol/L (ref 101–111)
Creatinine, Ser: 0.85 mg/dL (ref 0.44–1.00)
GFR calc Af Amer: >60 mL/min
GFR calc non Af Amer: >60 mL/min
Glucose, Bld: 105 mg/dL — ABNORMAL HIGH (ref 65–99)
Potassium: 3.3 mmol/L — ABNORMAL LOW (ref 3.5–5.1)
Sodium: 140 mmol/L (ref 135–145)

## 2016-07-13 LAB — CBC
HCT: 29.8 % — ABNORMAL LOW (ref 36.0–46.0)
Hemoglobin: 9.9 g/dL — ABNORMAL LOW (ref 12.0–15.0)
MCH: 29.7 pg (ref 26.0–34.0)
MCHC: 33.2 g/dL (ref 30.0–36.0)
MCV: 89.5 fL (ref 78.0–100.0)
Platelets: 308 K/uL (ref 150–400)
RBC: 3.33 MIL/uL — ABNORMAL LOW (ref 3.87–5.11)
RDW: 13.1 % (ref 11.5–15.5)
WBC: 9.2 K/uL (ref 4.0–10.5)

## 2016-07-13 MED ORDER — POTASSIUM CHLORIDE CRYS ER 20 MEQ PO TBCR
20.0000 meq | EXTENDED_RELEASE_TABLET | Freq: Once | ORAL | Status: AC
Start: 2016-07-13 — End: 2016-07-13
  Administered 2016-07-13: 20 meq via ORAL
  Filled 2016-07-13: qty 1

## 2016-07-13 MED ORDER — VANCOMYCIN HCL IN DEXTROSE 1-5 GM/200ML-% IV SOLN
1000.0000 mg | INTRAVENOUS | Status: DC
  Filled 2016-07-13: qty 200

## 2016-07-13 MED ORDER — TRAMADOL HCL 50 MG PO TABS
50.0000 mg | ORAL_TABLET | Freq: Four times a day (QID) | ORAL | Status: DC | PRN
  Administered 2016-07-13 – 2016-07-14 (×5): 50 mg via ORAL
  Filled 2016-07-13 (×5): qty 1

## 2016-07-13 NOTE — Progress Notes (Signed)
  Progress Note    07/13/2016 10:56 AM 2 Days Post-Op  Subjective:  Pain at rest in LLE  Vitals:   07/13/16 0900 07/13/16 1028  BP:  (!) 149/70  Pulse: 74   Resp: 16   Temp: 98.2 F (36.8 C)     Physical Exam: aaox3 Abdomen is soft LLE with toe gangrene and edema  CBC    Component Value Date/Time   WBC 9.2 07/13/2016 0218   RBC 3.33 (L) 07/13/2016 0218   HGB 9.9 (L) 07/13/2016 0218   HCT 29.8 (L) 07/13/2016 0218   PLT 308 07/13/2016 0218   MCV 89.5 07/13/2016 0218   MCH 29.7 07/13/2016 0218   MCHC 33.2 07/13/2016 0218   RDW 13.1 07/13/2016 0218   LYMPHSABS 2.2 07/05/2016 2015   MONOABS 0.7 07/05/2016 2015   EOSABS 0.3 07/05/2016 2015   BASOSABS 0.0 07/05/2016 2015    BMET    Component Value Date/Time   NA 140 07/13/2016 0218   K 3.3 (L) 07/13/2016 0218   CL 107 07/13/2016 0218   CO2 23 07/13/2016 0218   GLUCOSE 105 (H) 07/13/2016 0218   BUN 17 07/13/2016 0218   CREATININE 0.85 07/13/2016 0218   CREATININE 1.52 (H) 06/08/2016 1008   CALCIUM 9.0 07/13/2016 0218   GFRNONAA >60 07/13/2016 0218   GFRNONAA 36 (L) 06/08/2016 1008   GFRAA >60 07/13/2016 0218   GFRAA 41 (L) 06/08/2016 1008    INR    Component Value Date/Time   INR 1.15 07/07/2016 0531     Intake/Output Summary (Last 24 hours) at 07/13/16 1056 Last data filed at 07/13/16 0840  Gross per 24 hour  Intake              480 ml  Output                0 ml  Net              480 ml     Assessment:  65 y.o. female is here with aortogram demonstrating occlusion of aorta. Cleared from cardiology for OR.  Plan: To OR tomorrow for AoBF and L fem-pop bypass with graft. Discussed case with patient. NPO at midnight.    Milen Lengacher C. Donzetta Matters, MD [facility] Office: [phone] Pager: [phone]  07/13/2016 10:56 AM

## 2016-07-13 NOTE — Progress Notes (Signed)
*  PRELIMINARY RESULTS* Vascular Ultrasound Carotid Duplex (Doppler) has been completed.  Preliminary findings: Bilateral: No significant (1-39%) ICA stenosis. Antegrade vertebral flow.   Landry Mellow, RDMS, RVT  07/13/2016, 11:43 AM

## 2016-07-13 NOTE — Progress Notes (Addendum)
PROGRESS NOTE  Wendy Townsend UYQ:[id number] DOB: Sep 03, 1951 DOA: 07/05/2016 PCP: Gildardo Cranker, DO  Brief History 65 y.o.femalewith medical history significant of CVA, PVD, HTN, HLD, tobacco abuse presenting with a necrotic toe. Shesplashed hot grease on her left second toe in early September and was seen [facility] on September 9. She was diagnosed with a paronychia with possible abscess, xray negative for osteomyelitis. She was given Tramad[place]and Doxy x 10 days. At that time, her foot did not feel cold and a DP pulse was found with the Doppler. Sensation was noted to be intact.   She established care with Dr. Eulas Post on 9/20. She was diagnosed with cellulitis of the left second toe and given Levaquin 750 mg x 10 days; a wound culture was obtained and she was referred (again) to podiatry.   The patient continued to have increasing pain in her left foot to the point where she was unable to ambulate.. It has worsened over the last 2 weeks. As a result, the patient presented to the hospital for further evaluation. Assessment/Plan: Dry gangrene with PVD/ischemic pain of the left foot -Patient with apparent gangrene of the left 2nd toe -Left foot cold, without palpable pulses, ABIs appear to indicate markedly diminished flow to the affected extremity. -Vascular surgery consult and followup appreciated -07/05/16 ABI--left unable to calculate secondary to absent pulses, R-severe PAD -07/08/16 abd aortogram with runoff--left common femoral is occluded; left superficial femoral artery is occluded. The below-knee popliteal artery reconstitutes via collaterals. There is three-vessel runoff to the left foot but the distal posterior tibial and anterior tibial arteries are not well visualized -cardiology cleared pt for surgery -07/14/16--AoBF and left fem-pop bypass planned -continue PRN analgesic regimen   Surgery clearance -she will be high risk due to extensive PAD -will follow  rec's from cardiology regarding treatment for secondary atherosclerotic prevention -Non-invasive stress test demonstrated intermediate high risk -echo w/o wall motion abnormalities, preserved EF and demonstrating grade 1 diastolic HF -nonobstructive CAD appreciated in her LHC 07/11/16  HTN -Home meds include Clonidine, HCTZ, Lisinopril -will continue Holding HCTZ and Lisinopril due to presentation of acute renal dysfunction and the fact her BP is stable w/o them. Patient also started on coreg BID as per cardiology rec's -continue coreg and clonidine  HLD -Recent lipid profile (06/08/16) shows LDL 32, HDL 53 -Simvastatin exchanged to lipitor 80 mg daily as per recommendations from cardiology  Tobacco dependence -Encourage cessation. This was discussed with the patient  -Patch ordered. -patient looking to quit; sounds motivated and understood importance to quit for her condition   AKI on CKD: stage 3 at baseline  -most Recent creatinine 0.87 (back to baseline) -will continue holding nephrotoxic agents (HCTZ and ACE) for now -serum creatinine peaked at 1.80 -check BMET intermittently to monitor trend    Hyperglycemia/Impaired glucose tolerance -A1C 6.2 -discussed with patient--she is agreeable for carb modified diet  Protein calorie malnutrition: severe -nutrition supplements as per nutritional service recommendations   Hypokalemia -replete -check mag    Consultants:  Vascular surgery   Cardiology   Disposition Plan:   TBD  Family Communication: No  Family at bedside  Code Status:  FULL  DVT Prophylaxis: [place] Lovenox   Procedures: As Listed in Progress Note Above  Antibiotics: None    Subjective: Patient complains of left foot pain. Otherwise denies any fevers, chills, chest pain, short of breath, nausea, vomiting, diarrhea, abdominal pain, dysuria, hematuria. No rashes. Denies headache or neck pain.  Objective: Vitals:   07/13/16 0900 07/13/16 1028  07/13/16 1241 07/13/16 1336  BP:  (!) 149/70 125/80 137/72  Pulse: 74  84 88  Resp: 16  18 18   Temp: 98.2 F (36.8 C)  98.2 F (36.8 C) 98.5 F (36.9 C)  TempSrc: Oral  Oral Oral  SpO2: 100%  100% 100%  Weight:      Height:        Intake/Output Summary (Last 24 hours) at 07/13/16 1802 Last data filed at 07/13/16 1315  Gross per 24 hour  Intake              480 ml  Output                0 ml  Net              480 ml   Weight change:  Exam:   General:  Pt is alert, follows commands appropriately, not in acute distress  HEENT: No icterus, No thrush, No neck mass, /AT  Cardiovascular: RRR, S1/S2, no rubs, no gallops  Respiratory: CTA bilaterally, no wheezing, no crackles, no rhonchi  Abdomen: Soft/+BS, non tender, non distended, no guarding  Extremities: No edema, No lymphangitis, No petechiae, No rashes, no synovitis; LLE cool to touch with ischemic L-second toe   Data Reviewed: I have personally reviewed following labs and imaging studies Basic Metabolic Panel:  Recent Labs Lab 07/07/16 0531 07/08/16 0536 07/11/16 0724 07/12/16 0443 07/13/16 0218  NA 139 138 139  --  140  K 3.9 3.5 3.1*  --  3.3*  CL 104 106 107  --  107  CO2 25 24 23   --  23  GLUCOSE 95 108* 101*  --  105*  BUN 23* 19 17  --  17  CREATININE 1.17* 1.05* 0.99 0.87 0.85  CALCIUM 9.2 9.0 8.6*  --  9.0   Liver Function Tests: No results for input(s): AST, ALT, ALKPHOS, BILITOT, PROT, ALBUMIN in the last 168 hours. No results for input(s): LIPASE, AMYLASE in the last 168 hours. No results for input(s): AMMONIA in the last 168 hours. Coagulation Profile:  Recent Labs Lab 07/07/16 0531  INR 1.15   CBC:  Recent Labs Lab 07/07/16 0531 07/11/16 0724 07/13/16 0218  WBC 7.4 6.9 9.2  HGB 11.0* 10.1* 9.9*  HCT 32.5* 30.0* 29.8*  MCV 89.5 89.6 89.5  PLT 288 284 308   Cardiac Enzymes: No results for input(s): CKTOTAL, CKMB, CKMBINDEX, TROPONINI in the last 168 hours. BNP: Invalid  input(s): POCBNP CBG: No results for input(s): GLUCAP in the last 168 hours. HbA1C: No results for input(s): HGBA1C in the last 72 hours. Urine analysis:    Component Value Date/Time   COLORURINE YELLOW 06/08/2016 1008   APPEARANCEUR CLEAR 06/08/2016 1008   LABSPEC 1.014 06/08/2016 1008   PHURINE 5.0 06/08/2016 1008   GLUCOSEU NEGATIVE 06/08/2016 1008   HGBUR NEGATIVE 06/08/2016 1008   BILIRUBINUR NEGATIVE 06/08/2016 1008   KETONESUR NEGATIVE 06/08/2016 1008   PROTEINUR NEGATIVE 06/08/2016 1008   UROBILINOGEN 0.2 06/21/2008 2147   NITRITE NEGATIVE 06/08/2016 1008   LEUKOCYTESUR NEGATIVE 06/08/2016 1008   Sepsis Labs: @LABRCNTIP (procalcitonin:4,lacticidven:4) )No results found for this or any previous visit (from the past 240 hour(s)).   Scheduled Meds: . aspirin EC  81 mg Oral Daily  . atorvastatin  80 mg Oral q1800  . carvedilol  6.25 mg Oral BID WC  . cloNIDine  0.1 mg Oral BID  . enoxaparin (LOVENOX) injection  40  mg Subcutaneous QHS  . feeding supplement  1 Container Oral TID BM  . feeding supplement (PRO-STAT SUGAR FREE 64)  30 mL Oral BID  . nicotine  7 mg Transdermal Daily  . potassium chloride  40 mEq Oral Once  . saccharomyces boulardii  250 mg Oral BID  . sodium chloride flush  3 mL Intravenous Q12H  . [START ON 07/14/2016] vancomycin  1,000 mg Intravenous To SS-Surg   Continuous Infusions: . lactated ringers 50 mL/hr at 07/06/16 1956    Procedures/Studies: Nm Myocar Multi W/spect W/wall Motion / Ef  Result Date: 07/10/2016 CLINICAL DATA:  Chest pain and shortness of breath EXAM: MYOCARDIAL IMAGING WITH SPECT (REST AND PHARMACOLOGIC-STRESS) GATED LEFT VENTRICULAR WALL MOTION STUDY LEFT VENTRICULAR EJECTION FRACTION TECHNIQUE: Standard myocardial SPECT imaging was performed after resting intravenous injection of 10 mCi Tc-26m tetrofosmin. Subsequently, intravenous infusion of Lexiscan was performed under the supervision of the Cardiology staff. At peak effect  of the drug, 30 mCi Tc-28m tetrofosmin was injected intravenously and standard myocardial SPECT imaging was performed. Quantitative gated imaging was also performed to evaluate left ventricular wall motion, and estimate left ventricular ejection fraction. COMPARISON:  None available FINDINGS: Perfusion: Stress perfusion defect present of the mid inferior wall extending to the base and also laterally compatible with inducible ischemia with pharmacologic stress. Wall Motion: Normal left ventricular wall motion. No left ventricular dilation. Left Ventricular Ejection Fraction: 73 % End diastolic volume 41 ml End systolic volume 11 ml IMPRESSION: 1. Positive exam for inferior inducible ischemia extending laterally. 2. Normal left ventricular wall motion. 3. Left ventricular ejection fraction 73% 4. Non invasive risk stratification*: Intermediate to high *2012 Appropriate Use Criteria for Coronary Revascularization Focused Update: J Am Coll Cardiol. 9741;63(8):453-646. http://content.airportbarriers.com.aspx?articleid=1201161 These results will be called to the ordering clinician or representative by the Radiologist Assistant, and communication documented in the PACS or zVision Dashboard. Electronically Signed   By: Jerilynn Mages.  Shick M.D.   On: 07/10/2016 13:32   Dg Foot Complete Left  Result Date: 07/05/2016 CLINICAL DATA:  Necrotic wound on second toe left foot. EXAM: LEFT FOOT - COMPLETE 3+ VIEW COMPARISON:  Left foot radiograph 05/28/2016 FINDINGS: The bones are osteopenic. There is no fracture or dislocation. Joint spaces are maintained. There is again seen a soft tissue wound of the distal second toe. No focal osteolysis is identified. IMPRESSION: No radiographic evidence of osteomyelitis. Soft tissue wound of the distal aspect of the left second toe. Electronically Signed   By: Ulyses Jarred M.D.   On: 07/05/2016 19:57    Chattie Greeson, DO  [facility] Pager [phone]  If 7PM-7AM, please contact  night-coverage [email or web address] Password [email or web address] 07/13/2016, 6:02 PM   LOS: 8 days

## 2016-07-14 ENCOUNTER — Inpatient Hospital Stay (HOSPITAL_COMMUNITY): Payer: Commercial Managed Care - HMO | Admitting: Anesthesiology

## 2016-07-14 ENCOUNTER — Encounter (HOSPITAL_COMMUNITY)
Admission: EM | Disposition: A | Discharge status: Rehabilitation Facility | Payer: Self-pay | Source: Home / Self Care | Attending: Internal Medicine

## 2016-07-14 ENCOUNTER — Inpatient Hospital Stay (HOSPITAL_COMMUNITY): Payer: Commercial Managed Care - HMO

## 2016-07-14 DIAGNOSIS — F17200 Nicotine dependence, unspecified, uncomplicated: Secondary | ICD-10-CM

## 2016-07-14 DIAGNOSIS — J9601 Acute respiratory failure with hypoxia: Secondary | ICD-10-CM

## 2016-07-14 DIAGNOSIS — Z95828 Presence of other vascular implants and grafts: Secondary | ICD-10-CM

## 2016-07-14 HISTORY — PX: FEMORAL-POPLITEAL BYPASS GRAFT: SHX937

## 2016-07-14 HISTORY — PX: AORTA - BILATERAL FEMORAL ARTERY BYPASS GRAFT: SHX1175

## 2016-07-14 LAB — POCT I-STAT 7, (LYTES, BLD GAS, ICA,H+H)
ACID-BASE DEFICIT: 3 mmol/L — AB (ref 0.0–2.0)
ACID-BASE DEFICIT: 3 mmol/L — AB (ref 0.0–2.0)
Acid-base deficit: 2 mmol/L (ref 0.0–2.0)
BICARBONATE: 23.5 mmol/L (ref 20.0–28.0)
Bicarbonate: 22.8 mmol/L (ref 20.0–28.0)
Bicarbonate: 22.9 mmol/L (ref 20.0–28.0)
CALCIUM ION: 1.16 mmol/L (ref 1.15–1.40)
CALCIUM ION: 1.17 mmol/L (ref 1.15–1.40)
Calcium, Ion: 1.19 mmol/L (ref 1.15–1.40)
HCT: 22 % — ABNORMAL LOW (ref 36.0–46.0)
HCT: 27 % — ABNORMAL LOW (ref 36.0–46.0)
HEMATOCRIT: 22 % — AB (ref 36.0–46.0)
HEMOGLOBIN: 7.5 g/dL — AB (ref 12.0–15.0)
Hemoglobin: 9.2 g/dL — ABNORMAL LOW (ref 12.0–15.0)
Hemoglobin: 7.5 g/dL — ABNORMAL LOW (ref 12.0–15.0)
O2 SAT: 100 %
O2 Saturation: 100 %
O2 Saturation: 100 %
PH ART: 7.342 — AB (ref 7.350–7.450)
PO2 ART: 257 mmHg — AB (ref 83.0–108.0)
POTASSIUM: 3.3 mmol/L — AB (ref 3.5–5.1)
Patient temperature: 34.6
Potassium: 3.6 mmol/L (ref 3.5–5.1)
Potassium: 3.7 mmol/L (ref 3.5–5.1)
SODIUM: 138 mmol/L (ref 135–145)
Sodium: 137 mmol/L (ref 135–145)
Sodium: 138 mmol/L (ref 135–145)
TCO2: 24 mmol/L (ref 0–100)
TCO2: 25 mmol/L (ref 0–100)
TCO2: 24 mmol/L (ref 0–100)
pCO2 arterial: 40.9 mmHg (ref 32.0–48.0)
pCO2 arterial: 39.9 mmHg (ref 32.0–48.0)
pCO2 arterial: 39.8 mmHg (ref 32.0–48.0)
pH, Arterial: 7.367 (ref 7.350–7.450)
pH, Arterial: 7.357 (ref 7.350–7.450)
pO2, Arterial: 228 mmHg — ABNORMAL HIGH (ref 83.0–108.0)
pO2, Arterial: 224 mmHg — ABNORMAL HIGH (ref 83.0–108.0)

## 2016-07-14 LAB — POCT I-STAT 3, ART BLOOD GAS (G3+)
Acid-base deficit: 1 mmol/L (ref 0.0–2.0)
Bicarbonate: 23.9 mmol/L (ref 20.0–28.0)
O2 Saturation: 100 %
PH ART: 7.388 (ref 7.350–7.450)
TCO2: 25 mmol/L (ref 0–100)
pCO2 arterial: 39 mmHg (ref 32.0–48.0)
pO2, Arterial: 554 mmHg — ABNORMAL HIGH (ref 83.0–108.0)

## 2016-07-14 LAB — BASIC METABOLIC PANEL
Anion gap: 6 (ref 5–15)
BUN: 14 mg/dL (ref 6–20)
CALCIUM: 7.6 mg/dL — AB (ref 8.9–10.3)
CHLORIDE: 108 mmol/L (ref 101–111)
CO2: 21 mmol/L — ABNORMAL LOW (ref 22–32)
CREATININE: 0.71 mg/dL (ref 0.44–1.00)
GFR calc Af Amer: >60 mL/min (ref >60)
GFR calc non Af Amer: >60 mL/min (ref >60)
Glucose, Bld: 192 mg/dL — ABNORMAL HIGH (ref 65–99)
Potassium: 3.7 mmol/L (ref 3.5–5.1)
SODIUM: 135 mmol/L (ref 135–145)

## 2016-07-14 LAB — CBC
HCT: 28.8 % — ABNORMAL LOW (ref 36.0–46.0)
HCT: 30.3 % — ABNORMAL LOW (ref 36.0–46.0)
HEMOGLOBIN: 10.4 g/dL — AB (ref 12.0–15.0)
Hemoglobin: 9.6 g/dL — ABNORMAL LOW (ref 12.0–15.0)
MCH: 30.1 pg (ref 26.0–34.0)
MCH: 28.2 pg (ref 26.0–34.0)
MCHC: 33.3 g/dL (ref 30.0–36.0)
MCHC: 34.3 g/dL (ref 30.0–36.0)
MCV: 90.3 fL (ref 78.0–100.0)
MCV: 82.1 fL (ref 78.0–100.0)
Platelets: 309 10*3/uL (ref 150–400)
Platelets: 116 10*3/uL — ABNORMAL LOW (ref 150–400)
RBC: 3.19 MIL/uL — ABNORMAL LOW (ref 3.87–5.11)
RBC: 3.69 MIL/uL — ABNORMAL LOW (ref 3.87–5.11)
RDW: 13.3 % (ref 11.5–15.5)
RDW: 17.1 % — AB (ref 11.5–15.5)
WBC: 8.7 10*3/uL (ref 4.0–10.5)
WBC: 19.9 10*3/uL — ABNORMAL HIGH (ref 4.0–10.5)

## 2016-07-14 LAB — PROTIME-INR
INR: 1.43
PROTHROMBIN TIME: 17.5 s — AB (ref 11.4–15.2)

## 2016-07-14 LAB — TRIGLYCERIDES: TRIGLYCERIDES: 58 mg/dL (ref <150)

## 2016-07-14 LAB — PREPARE RBC (CROSSMATCH)

## 2016-07-14 LAB — APTT: aPTT: 33 seconds (ref 24–36)

## 2016-07-14 LAB — GLUCOSE, CAPILLARY: Glucose-Capillary: 180 mg/dL — ABNORMAL HIGH (ref 65–99)

## 2016-07-14 LAB — MAGNESIUM
MAGNESIUM: 1.2 mg/dL — AB (ref 1.7–2.4)
Magnesium: 0.8 mg/dL — CL (ref 1.7–2.4)

## 2016-07-14 LAB — MRSA PCR SCREENING: MRSA BY PCR: NEGATIVE

## 2016-07-14 SURGERY — CREATION, BYPASS, ARTERIAL, AORTA TO FEMORAL, BILATERAL, USING GRAFT
Anesthesia: General | Site: Leg Upper

## 2016-07-14 MED ORDER — VASOPRESSIN 20 UNIT/ML IV SOLN
INTRAVENOUS | Status: AC
  Filled 2016-07-14: qty 1

## 2016-07-14 MED ORDER — LACTATED RINGERS IV SOLN
INTRAVENOUS | Status: DC | PRN
  Administered 2016-07-14: 13:00:00 via INTRAVENOUS

## 2016-07-14 MED ORDER — FENTANYL CITRATE (PF) 100 MCG/2ML IJ SOLN
INTRAMUSCULAR | Status: AC
  Filled 2016-07-14: qty 2

## 2016-07-14 MED ORDER — PANTOPRAZOLE SODIUM 40 MG PO PACK
40.0000 mg | PACK | Freq: Every day | ORAL | Status: DC
  Filled 2016-07-14: qty 20

## 2016-07-14 MED ORDER — INSULIN ASPART 100 UNIT/ML ~~LOC~~ SOLN
0.0000 [IU] | SUBCUTANEOUS | Status: DC
  Administered 2016-07-14: 3 [IU] via SUBCUTANEOUS
  Administered 2016-07-15 – 2016-07-18 (×6): 2 [IU] via SUBCUTANEOUS

## 2016-07-14 MED ORDER — ALUM & MAG HYDROXIDE-SIMETH 200-200-20 MG/5ML PO SUSP: 15.0000 mL | ORAL | Status: DC | PRN

## 2016-07-14 MED ORDER — IOPAMIDOL (ISOVUE-300) INJECTION 61%
INTRAVENOUS | Status: AC
  Filled 2016-07-14: qty 50

## 2016-07-14 MED ORDER — FENTANYL CITRATE (PF) 250 MCG/5ML IJ SOLN
INTRAMUSCULAR | Status: AC
  Filled 2016-07-14: qty 5

## 2016-07-14 MED ORDER — HEMOSTATIC AGENTS (NO CHARGE) OPTIME
TOPICAL | Status: DC | PRN
  Administered 2016-07-14 (×2): 1 via TOPICAL

## 2016-07-14 MED ORDER — FENTANYL CITRATE (PF) 100 MCG/2ML IJ SOLN
100.0000 ug | INTRAMUSCULAR | Status: DC | PRN
  Administered 2016-07-15 (×2): 100 ug via INTRAVENOUS
  Filled 2016-07-14: qty 2

## 2016-07-14 MED ORDER — MAGNESIUM SULFATE 2 GM/50ML IV SOLN
2.0000 g | Freq: Once | INTRAVENOUS | Status: AC
  Administered 2016-07-14: 2 g via INTRAVENOUS
  Filled 2016-07-14: qty 50

## 2016-07-14 MED ORDER — MAGNESIUM SULFATE 2 GM/50ML IV SOLN: 2.0000 g | Freq: Every day | INTRAVENOUS | Status: DC | PRN

## 2016-07-14 MED ORDER — LIDOCAINE HCL (CARDIAC) 20 MG/ML IV SOLN
INTRAVENOUS | Status: DC | PRN
  Administered 2016-07-14: 100 mg via INTRAVENOUS

## 2016-07-14 MED ORDER — VANCOMYCIN HCL IN DEXTROSE 1-5 GM/200ML-% IV SOLN
INTRAVENOUS | Status: AC
  Filled 2016-07-14: qty 200

## 2016-07-14 MED ORDER — SODIUM CHLORIDE 0.9 % IV SOLN
INTRAVENOUS | Status: DC | PRN
  Administered 2016-07-14: 500 mL

## 2016-07-14 MED ORDER — OXYCODONE-ACETAMINOPHEN 5-325 MG PO TABS: 1.0000 | ORAL_TABLET | ORAL | Status: DC | PRN

## 2016-07-14 MED ORDER — METOPROLOL TARTRATE 5 MG/5ML IV SOLN: 2.0000 mg | INTRAVENOUS | Status: DC | PRN

## 2016-07-14 MED ORDER — 0.9 % SODIUM CHLORIDE (POUR BTL) OPTIME
TOPICAL | Status: DC | PRN
  Administered 2016-07-14: 2000 mL

## 2016-07-14 MED ORDER — FAMOTIDINE IN NACL 20-0.9 MG/50ML-% IV SOLN
20.0000 mg | Freq: Two times a day (BID) | INTRAVENOUS | Status: DC
  Administered 2016-07-14 – 2016-07-20 (×11): 20 mg via INTRAVENOUS
  Filled 2016-07-14 (×14): qty 50

## 2016-07-14 MED ORDER — ARTIFICIAL TEARS OP OINT
TOPICAL_OINTMENT | OPHTHALMIC | Status: DC | PRN
  Administered 2016-07-14: 1 via OPHTHALMIC

## 2016-07-14 MED ORDER — MIDAZOLAM HCL 2 MG/2ML IJ SOLN
INTRAMUSCULAR | Status: AC
  Filled 2016-07-14: qty 2

## 2016-07-14 MED ORDER — MANNITOL 25 % IV SOLN
INTRAVENOUS | Status: DC | PRN
  Administered 2016-07-14: 25 g via INTRAVENOUS

## 2016-07-14 MED ORDER — SODIUM CHLORIDE 0.9 % IV SOLN
INTRAVENOUS | Status: DC
  Administered 2016-07-14: 22:00:00 via INTRAVENOUS

## 2016-07-14 MED ORDER — HEPARIN SODIUM (PORCINE) 1000 UNIT/ML IJ SOLN
INTRAMUSCULAR | Status: DC | PRN
  Administered 2016-07-14: 6000 [IU] via INTRAVENOUS
  Administered 2016-07-14: 3000 [IU] via INTRAVENOUS
  Administered 2016-07-14: 2000 [IU] via INTRAVENOUS

## 2016-07-14 MED ORDER — ATORVASTATIN CALCIUM 80 MG PO TABS
80.0000 mg | ORAL_TABLET | Freq: Every day | ORAL | Status: DC
  Administered 2016-07-16 – 2016-07-19 (×4): 80 mg
  Filled 2016-07-14 (×4): qty 1

## 2016-07-14 MED ORDER — ROCURONIUM BROMIDE 100 MG/10ML IV SOLN
INTRAVENOUS | Status: DC | PRN
  Administered 2016-07-14: 30 mg via INTRAVENOUS
  Administered 2016-07-14: 50 mg via INTRAVENOUS
  Administered 2016-07-14: 20 mg via INTRAVENOUS
  Administered 2016-07-14: 50 mg via INTRAVENOUS

## 2016-07-14 MED ORDER — LACTATED RINGERS IV SOLN
INTRAVENOUS | Status: DC | PRN
  Administered 2016-07-14: 11:00:00 via INTRAVENOUS

## 2016-07-14 MED ORDER — ALBUMIN HUMAN 5 % IV SOLN
INTRAVENOUS | Status: DC | PRN
  Administered 2016-07-14 (×3): via INTRAVENOUS

## 2016-07-14 MED ORDER — POTASSIUM CHLORIDE CRYS ER 20 MEQ PO TBCR: 20.0000 meq | EXTENDED_RELEASE_TABLET | Freq: Every day | ORAL | Status: DC | PRN

## 2016-07-14 MED ORDER — MORPHINE SULFATE (PF) 2 MG/ML IV SOLN: 2.0000 mg | INTRAVENOUS | Status: DC | PRN

## 2016-07-14 MED ORDER — GUAIFENESIN-DM 100-10 MG/5ML PO SYRP: 15.0000 mL | ORAL_SOLUTION | ORAL | Status: DC | PRN

## 2016-07-14 MED ORDER — FENTANYL CITRATE (PF) 100 MCG/2ML IJ SOLN
INTRAMUSCULAR | Status: DC | PRN
  Administered 2016-07-14 (×3): 50 ug via INTRAVENOUS
  Administered 2016-07-14: 100 ug via INTRAVENOUS
  Administered 2016-07-14: 50 ug via INTRAVENOUS
  Administered 2016-07-14: 200 ug via INTRAVENOUS

## 2016-07-14 MED ORDER — PHENOL 1.4 % MT LIQD: 1.0000 | OROMUCOSAL | Status: DC | PRN

## 2016-07-14 MED ORDER — PROPOFOL 1000 MG/100ML IV EMUL
0.0000 ug/kg/min | INTRAVENOUS | Status: DC
Start: 2016-07-14 — End: 2016-07-16
  Administered 2016-07-14: 15 ug/kg/min via INTRAVENOUS
  Administered 2016-07-15: 50 ug/kg/min via INTRAVENOUS
  Filled 2016-07-14 (×2): qty 100

## 2016-07-14 MED ORDER — VASOPRESSIN 20 UNIT/ML IV SOLN
INTRAVENOUS | Status: DC | PRN
  Administered 2016-07-14: 2 [IU] via INTRAVENOUS

## 2016-07-14 MED ORDER — FENTANYL CITRATE (PF) 100 MCG/2ML IJ SOLN
100.0000 ug | INTRAMUSCULAR | Status: DC | PRN
  Filled 2016-07-14: qty 2

## 2016-07-14 MED ORDER — PHENYLEPHRINE HCL 10 MG/ML IJ SOLN
INTRAVENOUS | Status: DC | PRN
  Administered 2016-07-14: 50 ug/min via INTRAVENOUS

## 2016-07-14 MED ORDER — LABETALOL HCL 5 MG/ML IV SOLN: 10.0000 mg | INTRAVENOUS | Status: DC | PRN

## 2016-07-14 MED ORDER — LACTATED RINGERS IV SOLN
INTRAVENOUS | Status: DC | PRN
  Administered 2016-07-14 (×2): via INTRAVENOUS

## 2016-07-14 MED ORDER — PAPAVERINE HCL 30 MG/ML IJ SOLN
INTRAMUSCULAR | Status: AC
  Filled 2016-07-14: qty 2

## 2016-07-14 MED ORDER — SODIUM CHLORIDE 0.9 % IV SOLN: 500.0000 mL | Freq: Once | INTRAVENOUS | Status: DC | PRN

## 2016-07-14 MED ORDER — PROPOFOL 10 MG/ML IV BOLUS
INTRAVENOUS | Status: DC | PRN
  Administered 2016-07-14: 100 mg via INTRAVENOUS

## 2016-07-14 MED ORDER — MIDAZOLAM HCL 5 MG/5ML IJ SOLN
INTRAMUSCULAR | Status: DC | PRN
  Administered 2016-07-14 (×2): 2 mg via INTRAVENOUS

## 2016-07-14 MED ORDER — ENOXAPARIN SODIUM 40 MG/0.4ML ~~LOC~~ SOLN: 40.0000 mg | Freq: Every day | SUBCUTANEOUS | Status: DC

## 2016-07-14 MED ORDER — DOCUSATE SODIUM 100 MG PO CAPS: 100.0000 mg | ORAL_CAPSULE | Freq: Every day | ORAL | Status: DC

## 2016-07-14 MED ORDER — KCL IN DEXTROSE-NACL 20-5-0.45 MEQ/L-%-% IV SOLN
INTRAVENOUS | Status: DC
  Filled 2016-07-14: qty 1000

## 2016-07-14 MED ORDER — HYDRALAZINE HCL 20 MG/ML IJ SOLN: 5.0000 mg | INTRAMUSCULAR | Status: DC | PRN

## 2016-07-14 MED ORDER — ONDANSETRON HCL 4 MG/2ML IJ SOLN
INTRAMUSCULAR | Status: DC | PRN
  Administered 2016-07-14: 4 mg via INTRAVENOUS

## 2016-07-14 MED ORDER — PROTAMINE SULFATE 10 MG/ML IV SOLN
INTRAVENOUS | Status: DC | PRN
  Administered 2016-07-14: 25 mg via INTRAVENOUS

## 2016-07-14 SURGICAL SUPPLY — 99 items
ADH SKN CLS APL DERMABOND .7 (GAUZE/BANDAGES/DRESSINGS) ×4
AGENT HMST SPONGE THK3/8 (HEMOSTASIS) ×4
BAG ISL DRAPE 18X18 STRL (DRAPES) ×2
BAG ISOLATION DRAPE 18X18 (DRAPES) IMPLANT
BANDAGE ACE 4X5 VEL STRL LF (GAUZE/BANDAGES/DRESSINGS) IMPLANT
BANDAGE ESMARK 6X9 LF (GAUZE/BANDAGES/DRESSINGS) IMPLANT
BLADE SURG 11 STRL SS (BLADE) ×1 IMPLANT
BNDG CMPR 9X6 STRL LF SNTH (GAUZE/BANDAGES/DRESSINGS)
BNDG ESMARK 6X9 LF (GAUZE/BANDAGES/DRESSINGS)
CANISTER SUCTION 2500CC (MISCELLANEOUS) ×3 IMPLANT
CANNULA VESSEL 3MM 2 BLNT TIP (CANNULA) IMPLANT
CLIP TI MEDIUM 24 (CLIP) ×3 IMPLANT
CLIP TI WIDE RED SMALL 24 (CLIP) ×3 IMPLANT
COVER MAYO STAND STRL (DRAPES) ×2 IMPLANT
CUFF TOURNIQUET SINGLE 24IN (TOURNIQUET CUFF) IMPLANT
CUFF TOURNIQUET SINGLE 34IN LL (TOURNIQUET CUFF) IMPLANT
CUFF TOURNIQUET SINGLE 44IN (TOURNIQUET CUFF) IMPLANT
DERMABOND ADVANCED (GAUZE/BANDAGES/DRESSINGS) ×2
DERMABOND ADVANCED .7 DNX12 (GAUZE/BANDAGES/DRESSINGS) ×4 IMPLANT
DRAIN CHANNEL 15F RND FF W/TCR (WOUND CARE) IMPLANT
DRAPE ISOLATION BAG 18X18 (DRAPES) ×1
DRAPE PROXIMA HALF (DRAPES) IMPLANT
DRAPE X-RAY CASS 24X20 (DRAPES) IMPLANT
DRSG COVADERM 4X10 (GAUZE/BANDAGES/DRESSINGS) IMPLANT
DRSG COVADERM 4X8 (GAUZE/BANDAGES/DRESSINGS) IMPLANT
ELECT BLADE 4.0 EZ CLEAN MEGAD (MISCELLANEOUS) ×3
ELECT BLADE 6.5 EXT (BLADE) ×1 IMPLANT
ELECT CAUTERY BLADE 6.4 (BLADE) ×1 IMPLANT
ELECT REM PT RETURN 9FT ADLT (ELECTROSURGICAL) ×3
ELECTRODE BLDE 4.0 EZ CLN MEGD (MISCELLANEOUS) ×2 IMPLANT
ELECTRODE REM PT RTRN 9FT ADLT (ELECTROSURGICAL) ×2 IMPLANT
EVACUATOR SILICONE 100CC (DRAIN) IMPLANT
GAUZE SPONGE 4X4 16PLY XRAY LF (GAUZE/BANDAGES/DRESSINGS) ×1 IMPLANT
GLOVE BIO SURGEON STRL SZ 6.5 (GLOVE) ×2 IMPLANT
GLOVE BIO SURGEON STRL SZ7.5 (GLOVE) ×3 IMPLANT
GLOVE BIOGEL M 7.0 STRL (GLOVE) ×1 IMPLANT
GLOVE BIOGEL PI IND STRL 6.5 (GLOVE) IMPLANT
GLOVE BIOGEL PI IND STRL 7.0 (GLOVE) IMPLANT
GLOVE BIOGEL PI IND STRL 7.5 (GLOVE) IMPLANT
GLOVE BIOGEL PI INDICATOR 6.5 (GLOVE) ×6
GLOVE BIOGEL PI INDICATOR 7.0 (GLOVE) ×1
GLOVE BIOGEL PI INDICATOR 7.5 (GLOVE) ×1
GLOVE SURG SS PI 6.5 STRL IVOR (GLOVE) ×1 IMPLANT
GLOVE SURG SS PI 7.0 STRL IVOR (GLOVE) ×2 IMPLANT
GOWN STRL REUS W/ TWL LRG LVL3 (GOWN DISPOSABLE) ×4 IMPLANT
GOWN STRL REUS W/ TWL XL LVL3 (GOWN DISPOSABLE) ×2 IMPLANT
GOWN STRL REUS W/TWL LRG LVL3 (GOWN DISPOSABLE) ×24
GOWN STRL REUS W/TWL XL LVL3 (GOWN DISPOSABLE) ×6
GRAFT HEMASHIELD 14X7 (Vascular Products) ×1 IMPLANT
GRAFT PROPATEN W/RING 6X80X60 (Vascular Products) ×1 IMPLANT
HEMOSTAT SPONGE AVITENE ULTRA (HEMOSTASIS) ×2 IMPLANT
INSERT FOGARTY 61MM (MISCELLANEOUS) ×3 IMPLANT
INSERT FOGARTY SM (MISCELLANEOUS) ×6 IMPLANT
KIT BASIN OR (CUSTOM PROCEDURE TRAY) ×3 IMPLANT
KIT ROOM TURNOVER OR (KITS) ×3 IMPLANT
MARKER GRAFT CORONARY BYPASS (MISCELLANEOUS) IMPLANT
NS IRRIG 1000ML POUR BTL (IV SOLUTION) ×6 IMPLANT
PACK AORTA (CUSTOM PROCEDURE TRAY) ×3 IMPLANT
PACK PERIPHERAL VASCULAR (CUSTOM PROCEDURE TRAY) ×2 IMPLANT
PAD ARMBOARD 7.5X6 YLW CONV (MISCELLANEOUS) ×6 IMPLANT
PADDING CAST COTTON 6X4 STRL (CAST SUPPLIES) IMPLANT
PENCIL BUTTON HOLSTER BLD 10FT (ELECTRODE) ×1 IMPLANT
SET COLLECT BLD 21X3/4 12 (NEEDLE) IMPLANT
SPONGE LAP 18X18 X RAY DECT (DISPOSABLE) ×1 IMPLANT
STOPCOCK 4 WAY LG BORE MALE ST (IV SETS) IMPLANT
SUT ETHIBOND 5 LR DA (SUTURE) IMPLANT
SUT ETHILON 3 0 PS 1 (SUTURE) IMPLANT
SUT GORETEX 6.0 TT13 (SUTURE) IMPLANT
SUT GORETEX 6.0 TT9 (SUTURE) IMPLANT
SUT MNCRL AB 4-0 PS2 18 (SUTURE) ×9 IMPLANT
SUT PDS AB 1 TP1 54 (SUTURE) ×6 IMPLANT
SUT PROLENE 3 0 SH 48 (SUTURE) ×9 IMPLANT
SUT PROLENE 5 0 C 1 24 (SUTURE) ×4 IMPLANT
SUT PROLENE 5 0 C 1 36 (SUTURE) ×2 IMPLANT
SUT PROLENE 6 0 BV (SUTURE) ×7 IMPLANT
SUT PROLENE 6 0 C 1 24 (SUTURE) ×1 IMPLANT
SUT PROLENE 7 0 BV 1 (SUTURE) IMPLANT
SUT SILK 2 0 (SUTURE) ×3
SUT SILK 2 0 SH (SUTURE) ×3 IMPLANT
SUT SILK 2 0 TIES 17X18 (SUTURE) ×3
SUT SILK 2 0SH CR/8 30 (SUTURE) ×3 IMPLANT
SUT SILK 2-0 18XBRD TIE 12 (SUTURE) ×2 IMPLANT
SUT SILK 2-0 18XBRD TIE BLK (SUTURE) ×2 IMPLANT
SUT SILK 3 0 (SUTURE) ×3
SUT SILK 3 0 TIES 17X18 (SUTURE)
SUT SILK 3-0 18XBRD TIE 12 (SUTURE) ×2 IMPLANT
SUT SILK 3-0 18XBRD TIE BLK (SUTURE) ×2 IMPLANT
SUT VIC AB 2-0 CT1 27 (SUTURE) ×15
SUT VIC AB 2-0 CT1 TAPERPNT 27 (SUTURE) ×10 IMPLANT
SUT VIC AB 3-0 SH 27 (SUTURE) ×12
SUT VIC AB 3-0 SH 27X BRD (SUTURE) ×4 IMPLANT
SUT VICRYL 4-0 PS2 18IN ABS (SUTURE) ×1 IMPLANT
TAPE STRIPS DRAPE STRL (GAUZE/BANDAGES/DRESSINGS) ×1 IMPLANT
TAPE UMBILICAL COTTON 1/8X30 (MISCELLANEOUS) ×1 IMPLANT
TOWEL BLUE STERILE X RAY DET (MISCELLANEOUS) ×6 IMPLANT
TRAY FOLEY W/METER SILVER 16FR (SET/KITS/TRAYS/PACK) ×3 IMPLANT
TUBING EXTENTION W/L.L. (IV SETS) IMPLANT
UNDERPAD 30X30 (UNDERPADS AND DIAPERS) ×3 IMPLANT
WATER STERILE IRR 1000ML POUR (IV SOLUTION) ×5 IMPLANT

## 2016-07-14 NOTE — Progress Notes (Signed)
PROGRESS NOTE  Wendy Townsend MIW:[id number] DOB: [birth year] DOA: 07/05/2016 PCP: Gildardo Cranker, DO   Brief History 65 y.o.femalewith medical history significant of CVA, PVD, HTN, HLD, tobacco abuse presenting with a necrotic toe. Shesplashed hot grease on her left second toe in early September and was seen [facility] on September 9. She was diagnosed with a paronychia with possible abscess, xray negative for osteomyelitis. She was given Tramad[place]and Doxy x 10 days. At that time, her foot did not feel cold and a DP pulse was found with the Doppler. Sensation was noted to be intact.   She established care with Dr. Eulas Post on 9/20. She was diagnosed with cellulitis of the left second toe and given Levaquin 750 mg x 10 days; a wound culture was obtained and she was referred (again) to podiatry.   The patient continued to have increasing pain in her left foot to the point where she was unable to ambulate.. It has worsened over the last 2 weeks. As a result, the patient presented to the hospital for further evaluation. Assessment/Plan: Dry gangrene with PVD/ischemic pain of the left foot -Patient with dry gangrene of the left 2nd toe -Left foot cold, without palpable pulses -Vascular surgery consult and followup appreciated -07/05/16 ABI--left unable to calculate secondary to absent pulses, R-severe PAD -07/08/16 abd aortogram with runoff--left common femoral is occluded; left superficial femoral artery is occluded. The below-knee popliteal artery reconstitutes via collaterals. There is three-vessel runoff to the left foot but the distal posterior tibial and anterior tibial arteries are not well visualized -cardiology cleared pt for surgery -07/14/16--AoBF and left fem-pop bypass  -continue PRN analgesic regimen   Surgery clearance -she will be high risk due to extensive PAD -will follow rec's from cardiology regarding treatment for secondary atherosclerotic  prevention -Non-invasive stress test demonstrated intermediate high risk -echo w/o wall motion abnormalities, preserved EF and demonstrating grade 1 diastolic HF -nonobstructive CAD appreciated in her LHC 07/11/16  HTN -Home meds include Clonidine, HCTZ, Lisinopril -will continue Holding HCTZ and Lisinopril due to presentation of acute renal dysfunction -continue coreg BID as per cardiology rec's -continue coreg and clonidine  HLD -Recent lipid profile (06/08/16) shows LDL 32, HDL 53 -Simvastatin exchanged to lipitor 80 mg daily as per recommendations from cardiology  Tobacco dependence -Encourage cessation. This was discussed with the patient  -Patch ordered. -patient looking to quit; sounds motivated and understood importance to quit for her condition   AKI on CKD: stage 3 at baseline  -baseline creatinine 0.8-1.2 -continue holding nephrotoxic agents (HCTZ and ACE) for now -serum creatinine peaked at 1.80 -check BMET intermittently to monitor trend   Hyperglycemia/Impaired glucose tolerance -A1C 6.2 -discussed with patient--she is agreeable for carb modified diet  Protein calorie malnutrition: severe -nutrition supplements as per nutritional service recommendations   Hypokalemia/Hypomagnesemia -replete -check mag    Consultants:  Vascular surgery   Cardiology   Disposition Plan:   TBD  Family Communication: No  Family at bedside  Code Status:  FULL  DVT Prophylaxis: [place] Lovenox   Procedures: As Listed in Progress Note Above  Antibiotics: None   Subjective: No cp, no sob  Objective: Vitals:   07/13/16 1336 07/13/16 1949 07/14/16 0404 07/14/16 1006  BP: 137/72 122/67 (!) 149/72 131/72  Pulse: 88 79 85 83  Resp: 18 18 18    Temp: 98.5 F (36.9 C) 98.5 F (36.9 C) 98.1 F (36.7 C)   TempSrc: Oral Oral Oral   SpO2:  100% 100% 100%   Weight:      Height:        Intake/Output Summary (Last 24 hours) at 07/14/16 1737 Last data  filed at 07/14/16 1710  Gross per 24 hour  Intake             2720 ml  Output              830 ml  Net             1890 ml   Weight change:  Exam:   General:  Pt is alert, follows commands appropriately, not in acute distress  Cardiovascular: RRR, S1/S2, no rubs, no gallops  Respiratory: CTA bilaterally, no wheezing, no crackles, no rhonchi    Data Reviewed: I have personally reviewed following labs and imaging studies Basic Metabolic Panel:  Recent Labs Lab 07/08/16 0536 07/11/16 0724 07/12/16 0443 07/13/16 0218 07/14/16 0234  NA 138 139  --  140  --   K 3.5 3.1*  --  3.3*  --   CL 106 107  --  107  --   CO2 24 23  --  23  --   GLUCOSE 108* 101*  --  105*  --   BUN 19 17  --  17  --   CREATININE 1.05* 0.99 0.87 0.85  --   CALCIUM 9.0 8.6*  --  9.0  --   MG  --   --   --   --  1.2*   Liver Function Tests: No results for input(s): AST, ALT, ALKPHOS, BILITOT, PROT, ALBUMIN in the last 168 hours. No results for input(s): LIPASE, AMYLASE in the last 168 hours. No results for input(s): AMMONIA in the last 168 hours. Coagulation Profile: No results for input(s): INR, PROTIME in the last 168 hours. CBC:  Recent Labs Lab 07/11/16 0724 07/13/16 0218 07/14/16 0234  WBC 6.9 9.2 8.7  HGB 10.1* 9.9* 9.6*  HCT 30.0* 29.8* 28.8*  MCV 89.6 89.5 90.3  PLT 284 308 309   Cardiac Enzymes: No results for input(s): CKTOTAL, CKMB, CKMBINDEX, TROPONINI in the last 168 hours. BNP: Invalid input(s): POCBNP CBG: No results for input(s): GLUCAP in the last 168 hours. HbA1C: No results for input(s): HGBA1C in the last 72 hours. Urine analysis:    Component Value Date/Time   COLORURINE YELLOW 06/08/2016 1008   APPEARANCEUR CLEAR 06/08/2016 1008   LABSPEC 1.014 06/08/2016 1008   PHURINE 5.0 06/08/2016 1008   GLUCOSEU NEGATIVE 06/08/2016 1008   HGBUR NEGATIVE 06/08/2016 1008   BILIRUBINUR NEGATIVE 06/08/2016 1008   KETONESUR NEGATIVE 06/08/2016 1008   PROTEINUR NEGATIVE  06/08/2016 1008   UROBILINOGEN 0.2 06/21/2008 2147   NITRITE NEGATIVE 06/08/2016 1008   LEUKOCYTESUR NEGATIVE 06/08/2016 1008   Sepsis Labs: @LABRCNTIP (procalcitonin:4,lacticidven:4) ) Recent Results (from the past 240 hour(s))  MRSA PCR Screening     Status: None   Collection Time: 07/14/16 11:00 AM  Result Value Ref Range Status   MRSA by PCR NEGATIVE NEGATIVE Final    Comment:        The GeneXpert MRSA Assay (FDA approved for NASAL specimens only), is one component of a comprehensive MRSA colonization surveillance program. It is not intended to diagnose MRSA infection nor to guide or monitor treatment for MRSA infections.      Scheduled Meds: . [MAR Hold] aspirin EC  81 mg Oral Daily  . [MAR Hold] atorvastatin  80 mg Oral q1800  . [MAR Hold] carvedilol  6.25 mg Oral  BID WC  . [MAR Hold] cloNIDine  0.1 mg Oral BID  . [MAR Hold] enoxaparin (LOVENOX) injection  40 mg Subcutaneous QHS  . [MAR Hold] feeding supplement  1 Container Oral TID BM  . [MAR Hold] feeding supplement (PRO-STAT SUGAR FREE 64)  30 mL Oral BID  . [MAR Hold] nicotine  7 mg Transdermal Daily  . [MAR Hold] saccharomyces boulardii  250 mg Oral BID  . [MAR Hold] sodium chloride flush  3 mL Intravenous Q12H  . vancomycin      . [MAR Hold] vancomycin  1,000 mg Intravenous To SS-Surg   Continuous Infusions: . lactated ringers 50 mL/hr at 07/06/16 1956    Procedures/Studies: Nm Myocar Multi W/spect W/wall Motion / Ef  Result Date: 07/10/2016 CLINICAL DATA:  Chest pain and shortness of breath EXAM: MYOCARDIAL IMAGING WITH SPECT (REST AND PHARMACOLOGIC-STRESS) GATED LEFT VENTRICULAR WALL MOTION STUDY LEFT VENTRICULAR EJECTION FRACTION TECHNIQUE: Standard myocardial SPECT imaging was performed after resting intravenous injection of 10 mCi Tc-36m tetrofosmin. Subsequently, intravenous infusion of Lexiscan was performed under the supervision of the Cardiology staff. At peak effect of the drug, 30 mCi Tc-62m  tetrofosmin was injected intravenously and standard myocardial SPECT imaging was performed. Quantitative gated imaging was also performed to evaluate left ventricular wall motion, and estimate left ventricular ejection fraction. COMPARISON:  None available FINDINGS: Perfusion: Stress perfusion defect present of the mid inferior wall extending to the base and also laterally compatible with inducible ischemia with pharmacologic stress. Wall Motion: Normal left ventricular wall motion. No left ventricular dilation. Left Ventricular Ejection Fraction: 73 % End diastolic volume 41 ml End systolic volume 11 ml IMPRESSION: 1. Positive exam for inferior inducible ischemia extending laterally. 2. Normal left ventricular wall motion. 3. Left ventricular ejection fraction 73% 4. Non invasive risk stratification*: Intermediate to high *2012 Appropriate Use Criteria for Coronary Revascularization Focused Update: J Am Coll Cardiol. 5449;20(1):007-121. http://content.airportbarriers.com.aspx?articleid=1201161 These results will be called to the ordering clinician or representative by the Radiologist Assistant, and communication documented in the PACS or zVision Dashboard. Electronically Signed   By: Jerilynn Mages.  Shick M.D.   On: 07/10/2016 13:32   Dg Foot Complete Left  Result Date: 07/05/2016 CLINICAL DATA:  Necrotic wound on second toe left foot. EXAM: LEFT FOOT - COMPLETE 3+ VIEW COMPARISON:  Left foot radiograph 05/28/2016 FINDINGS: The bones are osteopenic. There is no fracture or dislocation. Joint spaces are maintained. There is again seen a soft tissue wound of the distal second toe. No focal osteolysis is identified. IMPRESSION: No radiographic evidence of osteomyelitis. Soft tissue wound of the distal aspect of the left second toe. Electronically Signed   By: Ulyses Jarred M.D.   On: 07/05/2016 19:57    Jenah Vanasten, DO  [facility] Pager [phone]  If 7PM-7AM, please contact  night-coverage [email or web address] Password [email or web address] 07/14/2016, 5:37 PM   LOS: 9 days

## 2016-07-14 NOTE — Progress Notes (Signed)
  Progress Note    07/14/2016 10:27 AM 3 Days Post-Op  Subjective: left leg pain  Vitals:   07/14/16 0404 07/14/16 1006  BP: (!) 149/72 131/72  Pulse: 85 83  Resp: 18   Temp: 98.1 F (36.7 C)     Physical Exam: aaox3 Abdomen is soft LLE with 2nd toe gangrene and edema   CBC    Component Value Date/Time   WBC 8.7 07/14/2016 0234   RBC 3.19 (L) 07/14/2016 0234   HGB 9.6 (L) 07/14/2016 0234   HCT 28.8 (L) 07/14/2016 0234   PLT 309 07/14/2016 0234   MCV 90.3 07/14/2016 0234   MCH 30.1 07/14/2016 0234   MCHC 33.3 07/14/2016 0234   RDW 13.3 07/14/2016 0234   LYMPHSABS 2.2 07/05/2016 2015   MONOABS 0.7 07/05/2016 2015   EOSABS 0.3 07/05/2016 2015   BASOSABS 0.0 07/05/2016 2015    BMET    Component Value Date/Time   NA 140 07/13/2016 0218   K 3.3 (L) 07/13/2016 0218   CL 107 07/13/2016 0218   CO2 23 07/13/2016 0218   GLUCOSE 105 (H) 07/13/2016 0218   BUN 17 07/13/2016 0218   CREATININE 0.85 07/13/2016 0218   CREATININE 1.52 (H) 06/08/2016 1008   CALCIUM 9.0 07/13/2016 0218   GFRNONAA >60 07/13/2016 0218   GFRNONAA 36 (L) 06/08/2016 1008   GFRAA >60 07/13/2016 0218   GFRAA 41 (L) 06/08/2016 1008    INR    Component Value Date/Time   INR 1.15 07/07/2016 0531     Intake/Output Summary (Last 24 hours) at 07/14/16 1027 Last data filed at 07/13/16 1315  Gross per 24 hour  Intake              240 ml  Output                0 ml  Net              240 ml   Assessment:  65 y.o. female is here with aortogram demonstrating occlusion of aorta. Cleared from cardiology for OR.  Plan: To OR today for AoBF and L fem-pop bypass with graft.     Deveion Denz C. Donzetta Matters, MD [facility] Office: [phone] Pager: [phone]  07/14/2016 10:27 AM

## 2016-07-14 NOTE — Progress Notes (Signed)
[facility] Progress Note Patient Name: Wendy Townsend DOB: 15-May-1951 MRN: [id number]   Date of Service  07/14/2016  HPI/Events of Note  Mg 0.8  eICU Interventions  MgS04 2 gm over 2 hours IV     Intervention Category Major Interventions: Electrolyte abnormality - evaluation and management  Christinia Gully 07/14/2016, 9:44 PM

## 2016-07-14 NOTE — Progress Notes (Signed)
Pt has been transported to the OR. Telemetry box was removed. Report was given to receiving nurse in Short Stay. All questions were answered.   Grant Fontana BSN, RN

## 2016-07-14 NOTE — Anesthesia Preprocedure Evaluation (Signed)
Anesthesia Evaluation  Patient identified by MRN, date of birth, ID band Patient awake    Reviewed: Allergy & Precautions, NPO status , Patient's Chart, lab work & pertinent test results  Airway Mallampati: I  TM Distance: >3 FB Neck ROM: Full    Dental   Pulmonary Current Smoker,    Pulmonary exam normal        Cardiovascular hypertension, + Peripheral Vascular Disease  Normal cardiovascular exam     Neuro/Psych    GI/Hepatic   Endo/Other    Renal/GU      Musculoskeletal   Abdominal   Peds  Hematology   Anesthesia Other Findings   Reproductive/Obstetrics                             Anesthesia Physical Anesthesia Plan  ASA: III  Anesthesia Plan: General   Post-op Pain Management:    Induction: Intravenous  Airway Management Planned: Oral ETT  Additional Equipment: Arterial line, CVP, PA Cath and Ultrasound Guidance Line Placement  Intra-op Plan:   Post-operative Plan: Post-operative intubation/ventilation  Informed Consent: I have reviewed the patients History and Physical, chart, labs and discussed the procedure including the risks, benefits and alternatives for the proposed anesthesia with the patient or authorized representative who has indicated his/her understanding and acceptance.     Plan Discussed with: CRNA and Surgeon  Anesthesia Plan Comments:         Anesthesia Quick Evaluation

## 2016-07-14 NOTE — Op Note (Signed)
Patient name: Wendy Townsend MRN: [id number] DOB: Oct 24, 1950 Sex: female  07/14/2016 Pre-operative Diagnosis: critical limb ischemia bilateral rest pain with left lower extremity wound Post-operative diagnosis:  Same Surgeon:  Erlene Quan C. Donzetta Matters, MD Assistants: Rodena Goldmann;  Silva Bandy, PA Procedure Performed: 1.  Aortobifemoral bypass with 14 x 67mm dacron in end to side fashion 2.  Left profunda endarterectomy 3.  Left femoral to below knee popliteal artery bypass with 58mm propaten via redo incisions  Indications:  65 year old female with a history of left femoral to below-knee popliteal artery bypass with graft. 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 She was therefore indicated for the above procedure.  Findings: The right common iliac artery was noted to be patent. The left common iliac artery was occluded. There was significant disease in the bilateral common femoral arteries without antegrade flow to either external iliac artery. On the left there was an occluded femoral to popliteal artery graft. Following aortobifemoral bypass and left femoral to below-knee popliteal artery bypass there were multiphasic signals at the AT and peroneal arteries on the left.   Procedure:  Patient was correctly identified taken to the operating room placed supine on the operating table general anesthesia was induced she was sterilely prepped and draped in usual fashion she was given antibiotics monitoring lines were placed by anesthesia timeout was called. We began with identifying the vessels in the right groin. We made a longitudinal incision overlying the vessels dissected down identified the inguinal ligament and our common femoral artery below that. Then dissected out our profunda SFA common femoral and divided the crossing vein on the external iliac artery. We also created a tunnel  overlying the external iliac artery into the retroperitoneum. We then turned our attention to the left groin. There didn't incision there before we again made this same incision a longitudinal fashion and divided down through scar tissue to identify our inguinal ligament again. Below that we identified our common femoral artery. There was noted to be no flow in this. We dissected out our SFA and profunda as well as the graft. We then turned our attention to the abdomen. A vertical midline was incision was made from xiphoid to pubis. Divided through the skin and subcutaneous tissue to identify the fascia in the midline to open this for the entirety of our incision. We inspected the valve was noted to be normal in appearance. There is an NG placed this was confirmed in position. We then retracted our small intestine to the right and our colon and omentum cephalad. We dissected with electrocautery through the retroperitoneum to identify our aorta. We identified the crossing renal vein and this was protected and then identified our right and left renal arteries as well. Then dissected down to the level of the bifurcation and there was noted to be flow within the aorta and into the right common iliac artery. Because of this we elected to perform an end-to-side anastomosis for our aortobifem. We created tunnels from both groins on the retroperitoneum and ureters up to the aorta and placed umbilical tapes on either side. On this patient was heparinized with 6000 units of heparin which was redosed every hour for the remaining of the case. We then clamped our aorta proximally and distally and a longitudinal incision on the aorta below the renal arteries and above the patent IMA. We did trim part of the aorta and  removed our clot within it. There was one lumbar artery that was clipped from external to the aorta. We then trimmed our 14 x 7 mm graft to size and sewn end to side to the aorta using 3-0 Prolene suture. Flushing  maneuvers were used to prevent any embolic material. We then tunneled the limbs of the graft trimmed to size in both groins. The right side will be dictated separately by Dr. Adele Barthel. On the left where the previous graft had been we had to perform endarterectomy into our profunda to establish the lumen. This was then sewn to site of the previous femoral-popliteal artery bypass graft end-to-side with 3-0 Prolene suture. When this was completed Doppler confirmed flow in both of our profunda femoris arteries bilaterally. The retractor the abdomen was released the groins were packed with lap pads we turned our attention to the below-knee. We opened the previous incision with knife followed by electrocautery and divided down to identify our existing graft. This was dissected out the popliteal above it as well as the TP trunk and 80 below it. We did divide the ATV vein during this time between ties. We then tunneled using a Gore tunneler between the below-knee and the groin incision. Patient was again heparinized and we passed a 6 mm ring PTFE graft between the 2 incisions. This was trimmed to size in the groin and we sewed end to side to our dacryon graft with 5-0 Prolene suture. Clamps were released we had brisk flow through the graft. We then turned our attention below knee clamped our graft. We trimmed our graft off of the below-knee popliteal artery, part of the graft out. We passed the dilator down the TP trunk had backbleeding from the TP trunk and ATas well as antegrade flow through the popliteal artery. We then trimmed our graft and sewed it end to side trifurcation using 6-0 Prolene suture. Flushing maneuvers again were used antegrade retrograde bleeding. On releasing our clamps we had pulsatility in the graft and had triphasic signals at the level of the peroneal and AT consistent with her preoperative arteriogram. This time the patient was given 25 mg of protamine which she tolerated. We then obtained  hemostasis of R groin wounds and close these multiple layers with 2-0 Vicryl through Vicryl and 4-0 Monocryl at the level of the skin. The below-knee incision was closed in a similar fashion. An our abdomen hemostasis was obtained we closed the retroperitoneum over the graft with 2-0 Vicryl suture. The bowels were allowed to return to their anatomic position and the omentum laid below the incision. We then closed our incision with a #1 PDS suture from either side  tied in the middle. The midline was closed with 3-0 Vicryl followed by 4-0 Monocryl and Dermabond. The patient will be kept intubated and transferred to the ICU for continued monitoring.   Blood loss 900 mL.  Crystalloid 4 L. 750 albumin.   Blood products 2 units +350 Cell Saver neck.  Urine output 540    Beckem Tomberlin C. Donzetta Matters, MD [facility] Office: [phone] Pager: [phone]

## 2016-07-14 NOTE — Op Note (Signed)
OPERATIVE NOTE   PROCEDURE: 1. Aortobifemoral bypass (14 mm x 7 mm) end to side configuration 2. Redo left femoral exposure 3. Redo left femoropopliteal bypass  PRE-OPERATIVE DIAGNOSIS: left leg rest pain  POST-OPERATIVE DIAGNOSIS: same as above   CO-SURGEONs: Servando Snare, MD; Adele Barthel, MD  ASSISTANT(S): Silva Bandy, [facility]   ANESTHESIA: general  ESTIMATED BLOOD LOSS: see Dr. Claretha Cooper Op Note for details  INDICATIONS:   Wendy Townsend is a 65 y.o. female who presents with left leg rest pain in the setting of thrombosed left femoropopliteal bypass due to left iliac arterial occlusion and right external iliac artery occlusion.  Dr. Trula Slade and Dr. Donzetta Matters had recommended: aortobifemoral bypass and redo left femoropopliteal bypass.  The patient is aware the risks of aortic surgery include but are not limited to: bleeding, need for transfusion, infection, death, stroke, paralysis, wound complications, bowel injuries, impotence, bowel ischemia, extended ventilation and future ventral hernias.  Overall, a mortality rate of 5-10% and morbidity rate of 30% was cited for the aortic portion of the case.  The risk, benefits, and alternative for bypass operations were discussed with the patient.  The patient is aware the risks include but are not limited to: bleeding, infection, myocardial infarction, stroke, limb loss, nerve damage, need for additional procedures in the future, wound complications, and inability to complete the bypass. The patient is aware of these risks and agreed to proceed.   DESCRIPTION: The majority of this case will be dictated by Dr. Donzetta Matters.  A two-surgeon technique was utilized due to the complexity of the case and need to limit the duration of the case to limit cardiopulmonary risks.  I completed this patient's right aortobifemoral limb anastomosis to the right common femoral artery.  The details are as follows:  A longitudinal incision was made over the common femoral  artery.  The femoral bifurcation was found to be high, nearly at the level of the inguinal ligament.  The superficial femoral artery and profunda femoral artery was dissected out and controlled with vessel loops.  The distal external iliac artery was dissected out underneath the inguinal ligament.  The cross vein was ligated in this process.  The distal iliac tunnel was started with blunt dissection.  There appeared to be soft area in the distal common femoral artery.  The superficial femoral artery was calcified.  The profunda femoral artery was soft.    The aortic exposure and proximal anastomosis was completed by Dr. Donzetta Matters. See his Op Note for details.  Each aortobifemoral limp was delivered to the appropriately orientated femoral exposure, taking care to maintain orientation.  I clamped the superficial femoral artery, profunda femoral artery and distal external iliac artery.  I made an incision in the distal common femoral artery.  There was active bleeding, so I re-evaluated the common femoral artery, finding a circumflex branch.  I placed this under tension with a vessel loop.  This ended the bleeding from the common femoral artery.  I extended the arteriotomy proximally and distally.  I washed out the lumen.  There was an obviously atherosclerotic arterial wall, but there was adequate lumen to not necessitate an endarterectomy.    I pulled the right aortobifemoral limb to tension and spatulated the right aortobifemoral limb, shortening the length of the graft in the process.  I sewed the right aortobifemoral limb to the right distal common femoral artery with a running stitch of 5-0 Prolene in an end-to-side configuration.  Prior to completing this anastomosis, I backbled  the profunda femoral artery and superficial femoral artery.  There was no thrombus noted.  I flushed the right aortobifemoral limb.  There was no thrombus noted.  I washed out the anastomosis and pulled up on the suture line with a nerve  hook.  I  completed the anastomosis in the usual fashion.  The right clamps were all removed.  Immediately there was a palpable pulse in the superficial femoral artery and profunda femoral artery.  There was no active bleeding from the anastomosis at this point.  The right groin was packed with Avitene and then a dry Ray-tec was packed in to the right groin.  The rest of the case was completed by Dr. Donzetta Matters.  See his Op Note for further details.   COMPLICATIONS: none  CONDITION: stable   Adele Barthel, MD [facility] [place] Office: [phone] Pager: [phone]  07/14/2016, 4:32 PM

## 2016-07-14 NOTE — Consult Note (Signed)
PULMONARY / CRITICAL CARE MEDICINE   Name: Wendy Townsend MRN: [id number] DOB: 06/30/1951    ADMISSION DATE:  07/05/2016 CONSULTATION DATE:  07/14/16  REFERRING MD:  Donzetta Matters  CHIEF COMPLAINT:  VDRF post op  HISTORY OF PRESENT ILLNESS:  Pt is encephelopathic; therefore, this HPI is obtained from chart review. Wendy Townsend is a 65 y.o. female with PMH as outlined below including but not limited to left CFA to below-knee popliteal artery bypass grafting on 05/30/08. She presented to [facility] ED 07/05/16 with necrotic left second toe.  She was evaluated by vascular surgery the following day who felt that she likely had an occluded bypass graft. She had an aortogram 07/08/16 which confirmed occluded left common femoral artery. She was seen by cardiology, had left heart cath and was cleared for surgery.  On 10/26, she was taken to the OR for redo of the leftfemoropopliteal artery bypass graft.  Following the procedure, she returned to the ICU on the ventilator. PCCM was consulted for management.   PAST MEDICAL HISTORY :  She  has a past medical history of Hyperlipidemia; Hypertension; Peripheral vascular disease ([place]); Stroke [facility]) (2002); and Tobacco abuse.  PAST SURGICAL HISTORY: She  has a past surgical history that includes Vein Surgery (Left); Cardiac catheterization (N/A, 07/08/2016); and Cardiac catheterization (N/A, 07/11/2016).  Allergies  Allergen Reactions  . Penicillins Swelling    Has patient had a PCN reaction causing immediate rash, facial/tongue/throat swelling, SOB or lightheadedness with hypotension: YES Has patient had a PCN reaction causing severe rash involving mucus membranes or skin necrosis: NO Has patient had a PCN reaction that required hospitalization NO Has patient had a PCN reaction occurring within the last 10 years: NO If all of the above answers are "NO", then may proceed with Cephalosporin use.    No current facility-administered medications on file prior to  encounter.    Current Outpatient Prescriptions on File Prior to Encounter  Medication Sig  . aspirin EC 81 MG tablet TAKE 1 TABLET (325 MG TOTAL) BY MOUTH DAILY.  . cloNIDine (CATAPRES) 0.2 MG tablet Take 1 tablet (0.2 mg total) by mouth 2 (two) times daily.  . hydrochlorothiazide (HYDRODIURIL) 25 MG tablet Take 0.5 tablets (12.5 mg total) by mouth daily. (Patient taking differently: Take 12.5 mg by mouth every other day. )  . lisinopril (PRINIVIL,ZESTRIL) 40 MG tablet Take 1 tablet (40 mg total) by mouth daily.  . potassium chloride (MICRO-K) 10 MEQ CR capsule TAKE 1 TABLET (10 MEQ TOTAL) BY MOUTH DAILY.  Marland Kitchen saccharomyces boulardii (FLORASTOR) 250 MG capsule Take 1 capsule (250 mg total) by mouth 2 (two) times daily.  . simvastatin (ZOCOR) 40 MG tablet TAKE 1 TABLET (40 MG TOTAL) BY MOUTH AT BEDTIME.    FAMILY HISTORY:  Her indicated that her mother is deceased. She indicated that her father is deceased. She indicated that four of her six sisters are alive. She indicated that only one of her three brothers is alive.    SOCIAL HISTORY: She  reports that she has been smoking Cigarettes.  She has smoked for the past 25.00 years. She has never used smokeless tobacco. She reports that she drinks alcohol. She reports that she uses drugs, including Marijuana.  REVIEW OF SYSTEMS:  Unable to obtain as patient is encephalopathic.  SUBJECTIVE: On vent, unresponsive.  VITAL SIGNS: BP 131/72 (BP Location: Left Arm)   Pulse 83   Temp 98.1 F (36.7 C) (Oral)   Resp 18   Ht 5\' 4"  (1.626 m)  Wt 128 lb 6.4 oz (58.2 kg)   SpO2 100%   BMI 22.04 kg/m   HEMODYNAMICS:    VENTILATOR SETTINGS: FiO2 (%):  [100 %] 100 % Set Rate:  [14 bmp] 14 bmp Vt Set:  [440 mL] 440 mL PEEP:  [5 cmH20] 5 cmH20 Plateau Pressure:  [4 cmH20] 4 cmH20  INTAKE / OUTPUT: I/O last 3 completed shifts: In: 4034 [P.O.:480; I.V.:4275; Blood:1030; IV Piggyback:750] Out: 1440 [Urine:540; Blood:900]   PHYSICAL  EXAMINATION: General: Middle-aged female, in NAD. Neuro: Sedated, nonresponsive, comfortable. HEENT: [place]/AT. PERRL, sclerae anicteric.  ETT in place. Cardiovascular: RRR, no M/R/G.  Lungs: Respirations even and unlabored.  CTA bilaterally, No W/R/R. Abdomen: Abdominal midline incision C/D/I. BS hypoactive., soft, NT/ND.  Musculoskeletal: Surgical incision C/D/I. No gross deformities, no edema.  Skin: Intact, warm, no rashes.  LABS:  BMET  Recent Labs Lab 07/08/16 0536 07/11/16 0724 07/12/16 0443 07/13/16 0218 07/14/16 1621 07/14/16 1721 07/14/16 1819  NA 138 139  --  140 137 138 138  K 3.5 3.1*  --  3.3* 3.3* 3.6 3.7  CL 106 107  --  107  --   --   --   CO2 24 23  --  23  --   --   --   BUN 19 17  --  17  --   --   --   CREATININE 1.05* 0.99 0.87 0.85  --   --   --   GLUCOSE 108* 101*  --  105*  --   --   --     Electrolytes  Recent Labs Lab 07/08/16 0536 07/11/16 0724 07/13/16 0218 07/14/16 0234  CALCIUM 9.0 8.6* 9.0  --   MG  --   --   --  1.2*    CBC  Recent Labs Lab 07/11/16 0724 07/13/16 0218 07/14/16 0234 07/14/16 1621 07/14/16 1721 07/14/16 1819  WBC 6.9 9.2 8.7  --   --   --   HGB 10.1* 9.9* 9.6* 7.5* 9.2* 7.5*  HCT 30.0* 29.8* 28.8* 22.0* 27.0* 22.0*  PLT 284 308 309  --   --   --     Coag's No results for input(s): APTT, INR in the last 168 hours.  Sepsis Markers No results for input(s): LATICACIDVEN, PROCALCITON, O2SATVEN in the last 168 hours.  ABG  Recent Labs Lab 07/14/16 1621 07/14/16 1721 07/14/16 1819  PHART 7.342* 7.367 7.357  PCO2ART 40.9 39.9 39.8  PO2ART 228.0* 257.0* 224.0*    Liver Enzymes No results for input(s): AST, ALT, ALKPHOS, BILITOT, ALBUMIN in the last 168 hours.  Cardiac Enzymes No results for input(s): TROPONINI, PROBNP in the last 168 hours.  Glucose No results for input(s): GLUCAP in the last 168 hours.  Imaging No results found.   STUDIES:  ABI 10/17 > Unable to calculate secondary to  absent pulses. Aortogram 10/20 > occluded left femoral artery bypass graft. Echo 10/22 > EF 65-70%, G1DD. LHC 10/23 > mild to moderate nonobstructive CAD, normal LV filling pressure, systemic hypertension. Carotid duplex 10/25 > no significant stenosis.  CULTURES: None.  ANTIBIOTICS: None.  SIGNIFICANT EVENTS: 10/17 > admitted with nonhealing second toe on left. 10/18 > seen by vascular surgery who felt that symptoms were due to occluded femoral artery bypass graft. 10/20 > aortogram which confirmed the above. 10/21 > cardiology consulted for preop clearance. 10/22 > nuclear stress test revealed stress perfusion defect in the mid inferior wall extending to the base and laterally. 10/23 > LHC. 10/26 >  to or for redo of left femoropopliteal bypass > return to ICU on the ventilator > PCCM consulted for vent management.  LINES/TUBES: ETT 10/26 > R IJ swan 10/26 >   DISCUSSION: 65 y.o. female with hx of left CFA to below-knee popliteal artery bypass grafting on 05/30/08. She presented to [facility] ED 07/05/16 with occluded graft. She was taken to the OR 10/26 for redo off bypass graft. She'll return in the ICU on the ventilator therefore PCCM was called for vent management.  ASSESSMENT / PLAN:  CARDIOVASCULAR A:  Occluded left CFA to below-knee popliteal artery bypass grafting - s/p redo 06/2616. Hx HTN, HLD, PVD P:  Postop management per vascular surgery. Altered hemodynamics. Continue preadmission ASA. Continue atorvastatin per tube. Hold coreg for now while intubated (started this admission) - can add back after extubation. Hold preadmission HCTZ, clonidine, lisinopril.  PULMONARY A: VDRF - s/p redo left femoral popliteal bypass graft 06/2616. Tobacco dependence. P:   Full vent support. Wean as able. VAP prevention measures. SBT in AM if able. BD's. CXR in AM. Tobacco cessation counseling once extubated.  RENAL A:   No acute issues. P:   NS @ 100. BMP in  AM.  GASTROINTESTINAL A:   GI prophylaxis. Nutrition. P:   SUP: Famotidine. NPO.  HEMATOLOGIC A:   VTE Prophylaxis. P:  SCD's / enoxaparin. CBC in AM.  INFECTIOUS A:   No indication of infection. P:   Monitor clinically.  ENDOCRINE A:   Hyperglycemia - no hx DM.  P:   SSI.  NEUROLOGIC A:   Acute encephalopathy. Hx stroke (2002). P:   Sedation:  Propofol gtt / fentanyl PRN. RASS goal: 0 to -1. Daily WUA.  Family updated: None available.  Interdisciplinary Family Meeting v Palliative Care Meeting:  Due by: 11/2.  CC time: 35 minutes.   Montey Hora, [facility] Pager: [phone]  or [phone] 07/14/2016, 8:45 PM  Attending Note:  I have examined patient, reviewed labs, studies and notes. I have discussed the case with [place], and I agree with the data and plans as amended above. 65 yo woman, hx tobacco, PVD. Admitted for aorto-bifem bypass and redo L fem-pop bypass. She returned to ICU on MV after a prolonged surgery. On my eval she is sedated, intubated, comfortable. No wheeze on exam. Her Mg is low and will be repleted. We will keep her sedated w propofol on MV overnight, plan for WUA and SBT am 10/27. Home BP regimen held.  Independent critical care time is 35 minutes.   Baltazar Apo, MD, PhD 07/14/2016, 10:55 PM [place] [facility] [phone] or if no answer [phone]

## 2016-07-14 NOTE — Transfer of Care (Signed)
Immediate Anesthesia Transfer of Care Note  Patient: Wendy Townsend  Procedure(s) Performed: Procedure(s): AORTOBIFEMORAL BYPASS GRAFT (N/A) REDO BYPASS GRAFT FEMORAL-POPLITEAL ARTERY (Left)  Patient Location: SICU  Anesthesia Type:General  Level of Consciousness: Patient remains intubated per anesthesia plan  Airway & Oxygen Therapy: Patient remains intubated per anesthesia plan and Patient placed on Ventilator (see vital sign flow sheet for setting)  Post-op Assessment: Report given to RN and Post -op Vital signs reviewed and stable  Post vital signs: Reviewed and stable  Last Vitals:  Vitals:   07/14/16 0404 07/14/16 1006  BP: (!) 149/72 131/72  Pulse: 85 83  Resp: 18   Temp: 36.7 C     Last Pain:  Vitals:   07/14/16 0925  TempSrc:   PainSc: Asleep      Patients Stated Pain Goal: 6 (88/91/69 4503)  Complications: No apparent anesthesia complications

## 2016-07-14 NOTE — Anesthesia Postprocedure Evaluation (Signed)
Anesthesia Post Note  Patient: Wendy Townsend  Procedure(s) Performed: Procedure(s) (LRB): AORTOBIFEMORAL BYPASS GRAFT (N/A) REDO BYPASS GRAFT FEMORAL-POPLITEAL ARTERY (Left)  Patient location during evaluation: ICU Anesthesia Type: General Level of consciousness: patient remains intubated per anesthesia plan Pain management: pain level controlled Vital Signs Assessment: post-procedure vital signs reviewed and stable Respiratory status: patient remains intubated per anesthesia plan Cardiovascular status: stable Anesthetic complications: no    Last Vitals:  Vitals:   07/14/16 0404 07/14/16 1006  BP: (!) 149/72 131/72  Pulse: 85 83  Resp: 18   Temp: 36.7 C     Last Pain:  Vitals:   07/14/16 0925  TempSrc:   PainSc: Asleep                 EDWARDS,Sharine Cadle

## 2016-07-15 ENCOUNTER — Inpatient Hospital Stay (HOSPITAL_COMMUNITY): Payer: Commercial Managed Care - HMO

## 2016-07-15 ENCOUNTER — Encounter (HOSPITAL_COMMUNITY): Payer: Self-pay | Admitting: Vascular Surgery

## 2016-07-15 DIAGNOSIS — I999 Unspecified disorder of circulatory system: Secondary | ICD-10-CM

## 2016-07-15 DIAGNOSIS — Z01810 Encounter for preprocedural cardiovascular examination: Secondary | ICD-10-CM

## 2016-07-15 DIAGNOSIS — M79672 Pain in left foot: Secondary | ICD-10-CM

## 2016-07-15 DIAGNOSIS — R0600 Dyspnea, unspecified: Secondary | ICD-10-CM

## 2016-07-15 DIAGNOSIS — Z95828 Presence of other vascular implants and grafts: Secondary | ICD-10-CM

## 2016-07-15 LAB — CBC
HEMATOCRIT: 26.2 % — AB (ref 36.0–46.0)
Hemoglobin: 9.2 g/dL — ABNORMAL LOW (ref 12.0–15.0)
MCH: 29.1 pg (ref 26.0–34.0)
MCHC: 35.1 g/dL (ref 30.0–36.0)
MCV: 82.9 fL (ref 78.0–100.0)
Platelets: 98 10*3/uL — ABNORMAL LOW (ref 150–400)
RBC: 3.16 MIL/uL — ABNORMAL LOW (ref 3.87–5.11)
RDW: 18.7 % — AB (ref 11.5–15.5)
WBC: 16.3 10*3/uL — ABNORMAL HIGH (ref 4.0–10.5)

## 2016-07-15 LAB — POCT I-STAT 3, ART BLOOD GAS (G3+)
ACID-BASE DEFICIT: 4 mmol/L — AB (ref 0.0–2.0)
BICARBONATE: 20.5 mmol/L (ref 20.0–28.0)
O2 Saturation: 100 %
PO2 ART: 179 mmHg — AB (ref 83.0–108.0)
Patient temperature: 37
TCO2: 22 mmol/L (ref 0–100)
pCO2 arterial: 35.5 mmHg (ref 32.0–48.0)
pH, Arterial: 7.369 (ref 7.350–7.450)

## 2016-07-15 LAB — GLUCOSE, CAPILLARY
GLUCOSE-CAPILLARY: 128 mg/dL — AB (ref 65–99)
GLUCOSE-CAPILLARY: 133 mg/dL — AB (ref 65–99)
GLUCOSE-CAPILLARY: 142 mg/dL — AB (ref 65–99)
Glucose-Capillary: 121 mg/dL — ABNORMAL HIGH (ref 65–99)
Glucose-Capillary: 104 mg/dL — ABNORMAL HIGH (ref 65–99)
Glucose-Capillary: 132 mg/dL — ABNORMAL HIGH (ref 65–99)
Glucose-Capillary: 113 mg/dL — ABNORMAL HIGH (ref 65–99)

## 2016-07-15 LAB — COMPREHENSIVE METABOLIC PANEL
ALT: 18 U/L (ref 14–54)
ANION GAP: 6 (ref 5–15)
AST: 24 U/L (ref 15–41)
Albumin: 2.8 g/dL — ABNORMAL LOW (ref 3.5–5.0)
Alkaline Phosphatase: 31 U/L — ABNORMAL LOW (ref 38–126)
BILIRUBIN TOTAL: 6.5 mg/dL — AB (ref 0.3–1.2)
BUN: 16 mg/dL (ref 6–20)
CO2: 20 mmol/L — ABNORMAL LOW (ref 22–32)
Calcium: 7.8 mg/dL — ABNORMAL LOW (ref 8.9–10.3)
Chloride: 112 mmol/L — ABNORMAL HIGH (ref 101–111)
Creatinine, Ser: 0.9 mg/dL (ref 0.44–1.00)
GFR calc Af Amer: >60 mL/min (ref >60)
Glucose, Bld: 115 mg/dL — ABNORMAL HIGH (ref 65–99)
POTASSIUM: 4 mmol/L (ref 3.5–5.1)
Sodium: 138 mmol/L (ref 135–145)
TOTAL PROTEIN: 4.2 g/dL — AB (ref 6.5–8.1)

## 2016-07-15 LAB — URINALYSIS W MICROSCOPIC (NOT AT ARMC)
GLUCOSE, UA: NEGATIVE mg/dL
Hgb urine dipstick: NEGATIVE
Ketones, ur: 15 mg/dL — AB
NITRITE: POSITIVE — AB
PH: 5.5 (ref 5.0–8.0)
Protein, ur: NEGATIVE mg/dL
SPECIFIC GRAVITY, URINE: 1.027 (ref 1.005–1.030)

## 2016-07-15 LAB — CREATININE, URINE, RANDOM: Creatinine, Urine: 203.78 mg/dL

## 2016-07-15 LAB — BASIC METABOLIC PANEL
ANION GAP: 6 (ref 5–15)
BUN: 17 mg/dL (ref 6–20)
CHLORIDE: 112 mmol/L — AB (ref 101–111)
CO2: 20 mmol/L — AB (ref 22–32)
Calcium: 7.9 mg/dL — ABNORMAL LOW (ref 8.9–10.3)
Creatinine, Ser: 1.19 mg/dL — ABNORMAL HIGH (ref 0.44–1.00)
GFR calc Af Amer: 55 mL/min — ABNORMAL LOW (ref >60)
GFR, EST NON AFRICAN AMERICAN: 47 mL/min — AB (ref >60)
GLUCOSE: 137 mg/dL — AB (ref 65–99)
POTASSIUM: 4 mmol/L (ref 3.5–5.1)
Sodium: 138 mmol/L (ref 135–145)

## 2016-07-15 LAB — OSMOLALITY, URINE: OSMOLALITY UR: 505 mosm/kg (ref 300–900)

## 2016-07-15 LAB — AMYLASE: AMYLASE: 55 U/L (ref 28–100)

## 2016-07-15 LAB — PHOSPHORUS: PHOSPHORUS: 3.8 mg/dL (ref 2.5–4.6)

## 2016-07-15 LAB — SODIUM, URINE, RANDOM

## 2016-07-15 LAB — MAGNESIUM: MAGNESIUM: 1.4 mg/dL — AB (ref 1.7–2.4)

## 2016-07-15 MED ORDER — SODIUM CHLORIDE 0.9 % IV BOLUS (SEPSIS)
500.0000 mL | Freq: Once | INTRAVENOUS | Status: AC
  Administered 2016-07-15: 500 mL via INTRAVENOUS

## 2016-07-15 MED ORDER — NALOXONE HCL 0.4 MG/ML IJ SOLN: 0.4000 mg | INTRAMUSCULAR | Status: DC | PRN

## 2016-07-15 MED ORDER — SODIUM CHLORIDE 0.9% FLUSH: 9.0000 mL | INTRAVENOUS | Status: DC | PRN

## 2016-07-15 MED ORDER — LABETALOL HCL 5 MG/ML IV SOLN
INTRAVENOUS | Status: AC
  Administered 2016-07-15: 10 mg via INTRAVENOUS
  Filled 2016-07-15: qty 4

## 2016-07-15 MED ORDER — DIPHENHYDRAMINE HCL 12.5 MG/5ML PO ELIX: 12.5000 mg | ORAL_SOLUTION | Freq: Four times a day (QID) | ORAL | Status: DC | PRN

## 2016-07-15 MED ORDER — DIPHENHYDRAMINE HCL 50 MG/ML IJ SOLN: 12.5000 mg | Freq: Four times a day (QID) | INTRAMUSCULAR | Status: DC | PRN

## 2016-07-15 MED ORDER — CHLORHEXIDINE GLUCONATE 0.12% ORAL RINSE (MEDLINE KIT)
15.0000 mL | Freq: Two times a day (BID) | OROMUCOSAL | Status: DC
  Administered 2016-07-15 (×2): 15 mL via OROMUCOSAL

## 2016-07-15 MED ORDER — ORAL CARE MOUTH RINSE
15.0000 mL | Freq: Four times a day (QID) | OROMUCOSAL | Status: DC
  Administered 2016-07-15 – 2016-07-19 (×10): 15 mL via OROMUCOSAL

## 2016-07-15 MED ORDER — HYDROMORPHONE 1 MG/ML IV SOLN
INTRAVENOUS | Status: DC
  Administered 2016-07-15: 1.4 mg via INTRAVENOUS
  Administered 2016-07-15: 17:00:00 via INTRAVENOUS
  Administered 2016-07-16: 0.6 mg via INTRAVENOUS
  Administered 2016-07-16: 1.2 mg via INTRAVENOUS
  Administered 2016-07-16: 0 mg via INTRAVENOUS
  Administered 2016-07-16: 0.4 mg via INTRAVENOUS
  Administered 2016-07-16: 0.8 mg via INTRAVENOUS
  Administered 2016-07-17: 0 mg via INTRAVENOUS
  Administered 2016-07-17: 0.2 mg via INTRAVENOUS
  Administered 2016-07-17: 0.6 mg via INTRAVENOUS
  Administered 2016-07-17: 0 mg via INTRAVENOUS
  Administered 2016-07-17: 1.4 mg via INTRAVENOUS
  Administered 2016-07-17: 1.2 mg via INTRAVENOUS
  Administered 2016-07-18: 0.2 mg via INTRAVENOUS
  Administered 2016-07-18: 1 mg via INTRAVENOUS
  Administered 2016-07-18: 0.6 mg via INTRAVENOUS
  Filled 2016-07-15 (×2): qty 25

## 2016-07-15 MED ORDER — LISINOPRIL 20 MG PO TABS
40.0000 mg | ORAL_TABLET | Freq: Every day | ORAL | Status: DC
  Administered 2016-07-15: 40 mg via ORAL
  Filled 2016-07-15: qty 2

## 2016-07-15 MED ORDER — MAGNESIUM SULFATE 2 GM/50ML IV SOLN
2.0000 g | Freq: Once | INTRAVENOUS | Status: AC
  Administered 2016-07-15: 2 g via INTRAVENOUS
  Filled 2016-07-15: qty 50

## 2016-07-15 MED ORDER — ONDANSETRON HCL 4 MG/2ML IJ SOLN: 4.0000 mg | Freq: Four times a day (QID) | INTRAMUSCULAR | Status: DC | PRN

## 2016-07-15 MED ORDER — LABETALOL HCL 5 MG/ML IV SOLN: 10.0000 mg | INTRAVENOUS | Status: DC | PRN

## 2016-07-15 MED ORDER — SODIUM CHLORIDE 0.45 % IV SOLN
INTRAVENOUS | Status: DC
  Administered 2016-07-15 – 2016-07-16 (×2): via INTRAVENOUS

## 2016-07-15 MED ORDER — CLONIDINE HCL 0.2 MG/24HR TD PTWK
0.2000 mg | MEDICATED_PATCH | TRANSDERMAL | Status: DC
  Administered 2016-07-15: 0.2 mg via TRANSDERMAL
  Filled 2016-07-15: qty 1

## 2016-07-15 MED ORDER — MORPHINE SULFATE (PF) 2 MG/ML IV SOLN
1.0000 mg | INTRAVENOUS | Status: DC | PRN
  Administered 2016-07-15: 2 mg via INTRAVENOUS
  Filled 2016-07-15: qty 1

## 2016-07-15 MED FILL — Heparin Sodium (Porcine) Inj 1000 Unit/ML: INTRAMUSCULAR | Qty: 30 | Status: AC

## 2016-07-15 MED FILL — Sodium Chloride IV Soln 0.9%: INTRAVENOUS | Qty: 2000 | Status: AC

## 2016-07-15 NOTE — Care Management Important Message (Signed)
Important Message  Patient Details  Name: Wendy Townsend MRN: [id number] Date of Birth: [birth year]   Medicare Important Message Given:  Yes    Nathen May 07/15/2016, 2:39 PM

## 2016-07-15 NOTE — Procedures (Signed)
Extubation Procedure Note  Patient Details:   Name: Wendy Townsend DOB: 1951-06-28 MRN: [id number]   Airway Documentation:     Evaluation  O2 sats: stable throughout Complications: No apparent complications Patient did tolerate procedure well. Bilateral Breath Sounds: Rhonchi   Yes  Patient tolerated wean. MD ordered to extubate. Positive for cuff leak. Patient extubated to a 3 Lpm nasal cannula. No signs of dyspnea or stridor. Patient instructed on the Incentive Spirometer achieving 500 mL three times. RN at bedside.   [name] 07/15/2016, 11:13 AM

## 2016-07-15 NOTE — Progress Notes (Signed)
CRITICAL VALUE ALERT  Critical value received:  Mag 0.8  Date of notification:  07/14/16  Time of notification:  2130  Critical value read back:Yes.    Nurse who received alert:  Wyline Beady  MD notified (1st page):  Larry Sierras  Time of first page:  2130  MD notified (2nd page):  Time of second page:  Responding MD:  Larry Sierras  Time MD responded:  2130

## 2016-07-15 NOTE — Significant Event (Signed)
Patient refused morphine for pain medication. Patient stated it does not work. RN notified Vascular MD and PA. PA [employer] responded back. Gave telephone orders for reduced dose dilaudid PCA and also another NS bolus 500cc for low urinary output. PA unable to enter these orders in EPIC and gave RN telephone orders to enter them in EPIC.

## 2016-07-15 NOTE — Progress Notes (Signed)
VASCULAR LAB PRELIMINARY  ARTERIAL  ABI completed: Right ABI of 0.7 is suggestive of moderate arterial occlusive disease at rest. Left ABI of 0.87 is suggestive of moderate arterial occlusive disease at rest. Right TBI of 0.47 and left TBI of 0.26 are suggestive of abnormal flow at rest.   RIGHT    LEFT    PRESSURE WAVEFORM  PRESSURE WAVEFORM  BRACHIAL 120 Triphasic BRACHIAL 135 Biphasic  DP 85 Monophasic DP 118 Monophasic  PT 94 Monophasic PT 91 Monophasic  GREAT TOE 63 NA GREAT TOE 35 NA    RIGHT LEFT  ABI 0.7 0.87     Legrand Como, RVT 07/15/2016, 3:50 PM

## 2016-07-15 NOTE — Significant Event (Signed)
Patient with persistent low urinary output. Spoke with Dr. Titus Mould. Order received for 500cc NS bolus and increase maintenance fluids to 125cc/hour. RN to enter these orders in [facility] per MD.

## 2016-07-15 NOTE — Progress Notes (Addendum)
[facility] [place]  Subjective  - Intubated following command some.   Objective (!) 155/82 (!) 101 99 F (37.2 C) (!) 21 99%  Intake/Output Summary (Last 24 hours) at 07/15/16 0849 Last data filed at 07/15/16 0700  Gross per 24 hour  Intake          7902.51 ml  Output             2177 ml  Net          5725.51 ml    Abdomin soft, incision clean and dry Groins soft clean and dry Doppler left AT, right DP signals Left LE incision clean and dry  Assessment/Planning: POD # 1  Procedure Performed: 1.  Aortobifemoral bypass with 14 x 86mm dacron in end to side fashion 2.  Left profunda endarterectomy 3.  Left femoral to below knee popliteal artery bypass with 84mm propaten via redo incisions  Pending extubation  Maintain NG tube Cr 0.9, decrease in UO likely dehydration Paint left foot dry gangrene wounds with badadine  Mobility as tolerates, maintain NPO   Theda Sers [facility] MAUREEN 07/15/2016 8:49 AM --  Laboratory Lab Results:  Recent Labs  07/14/16 2038 07/15/16 0415  WBC 19.9* 16.3*  HGB 10.4* 9.2*  HCT 30.3* 26.2*  PLT 116* 98*   BMET  Recent Labs  07/14/16 2038 07/15/16 0415  NA 135 138  K 3.7 4.0  CL 108 112*  CO2 21* 20*  GLUCOSE 192* 115*  BUN 14 16  CREATININE 0.71 0.90  CALCIUM 7.6* 7.8*    COAG Lab Results  Component Value Date   INR 1.43 07/14/2016   INR 1.15 07/07/2016   INR 1.07 07/05/2016   No results found for: PTT  I have interviewed patient with PA and agree with assessment and plan above.   Brandon C. Donzetta Matters, MD [facility] [place] Office: [phone] Pager: [phone]

## 2016-07-15 NOTE — Significant Event (Signed)
Dr. Donzetta Matters came to assess patient at bedside. Updated him on patient's conditions. MD gave verbal order to give another 500cc NS bolus.

## 2016-07-15 NOTE — Progress Notes (Addendum)
PULMONARY / CRITICAL CARE MEDICINE   Name: Wendy Townsend MRN: [id number] DOB: 09-27-1950    ADMISSION DATE:  07/05/2016 CONSULTATION DATE:  07/14/16  REFERRING MD:  Donzetta Matters  CHIEF COMPLAINT:  VDRF post op  HISTORY OF PRESENT ILLNESS:  Pt is encephelopathic; therefore, this HPI is obtained from chart review. Wendy Townsend is a 65 y.o. female with PMH as outlined below including but not limited to left CFA to below-knee popliteal artery bypass grafting on 05/30/08. She presented to [facility] ED 07/05/16 with necrotic left second toe.  She was evaluated by vascular surgery the following day who felt that she likely had an occluded bypass graft. She had an aortogram 07/08/16 which confirmed occluded left common femoral artery. She was seen by cardiology, had left heart cath and was cleared for surgery.On 10/26, she was taken to the OR for redo of the leftfemoropopliteal artery bypass graft.  Following the procedure, she returned to the ICU on the ventilator. PCCM was consulted for management.  SUBJECTIVE: On vent, states having some pain but unable to localize.   VITAL SIGNS: BP (!) 155/82   Pulse (!) 101   Temp 99 F (37.2 C)   Resp (!) 21   Ht 5\' 4"  (1.626 m)   Wt 143 lb 4.8 oz (65 kg)   SpO2 99%   BMI 24.60 kg/m   HEMODYNAMICS: PAP: (16-24)/(4-12) 23/7 CVP:  [1 mmHg] 1 mmHg CO:  [3.2 L/min-5.5 L/min] 5.5 L/min CI:  [1.9 L/min/m2-3.2 L/min/m2] 3.2 L/min/m2  VENTILATOR SETTINGS: Vent Mode: PRVC FiO2 (%):  [40 %-100 %] 40 % Set Rate:  [14 bmp] 14 bmp Vt Set:  [440 mL] 440 mL PEEP:  [5 cmH20] 5 cmH20 Plateau Pressure:  [4 cmH20-14 cmH20] 10 cmH20  INTAKE / OUTPUT: I/O last 3 completed shifts: In: 7902.5 [I.V.:5882.5; Blood:1030; NG/GT:140; IV Piggyback:850] Out: 2177 [Urine:977; Emesis/NG output:300; Blood:900]   PHYSICAL EXAMINATION: General: Middle-aged female, in NAD. Neuro: RASS zero, appears to be in some pain. follows some commands.  HEENT: [place]/AT. PERRL, sclerae  anicteric.  ETT in place. Cardiovascular: RRR, no M/R/G.  Lungs: Respirations even and unlabored.  CTA bilaterally, No W/R/R. Abdomen: Abdominal midline incision C/D/I. BS hypoactive., soft, NT/ND.  Musculoskeletal: Surgical incision C/D/I. No gross deformities, no edema.  Dry gangrene seen on left 2nd toe. exts warm to touch.  Skin: Intact, warm, no rashes.  LABS:  BMET  Recent Labs Lab 07/13/16 0218  07/14/16 1819 07/14/16 2038 07/15/16 0415  NA 140  < > 138 135 138  K 3.3*  < > 3.7 3.7 4.0  CL 107  --   --  108 112*  CO2 23  --   --  21* 20*  BUN 17  --   --  14 16  CREATININE 0.85  --   --  0.71 0.90  GLUCOSE 105*  --   --  192* 115*  < > = values in this interval not displayed.  Electrolytes  Recent Labs Lab 07/13/16 0218 07/14/16 0234 07/14/16 2038 07/15/16 0415 07/15/16 0426  CALCIUM 9.0  --  7.6* 7.8*  --   MG  --  1.2* 0.8*  --  1.4*  PHOS  --   --   --   --  3.8    CBC  Recent Labs Lab 07/14/16 0234  07/14/16 1819 07/14/16 2038 07/15/16 0415  WBC 8.7  --   --  19.9* 16.3*  HGB 9.6*  < > 7.5* 10.4* 9.2*  HCT 28.8*  < >  22.0* 30.3* 26.2*  PLT 309  --   --  116* 98*  < > = values in this interval not displayed.  Coag's  Recent Labs Lab 07/14/16 2038  APTT 33  INR 1.43    Sepsis Markers No results for input(s): LATICACIDVEN, PROCALCITON, O2SATVEN in the last 168 hours.  ABG  Recent Labs Lab 07/14/16 1819 07/14/16 2045 07/15/16 0413  PHART 7.357 7.388 7.369  PCO2ART 39.8 39.0 35.5  PO2ART 224.0* 554.0* 179.0*    Liver Enzymes  Recent Labs Lab 07/15/16 0415  AST 24  ALT 18  ALKPHOS 31*  BILITOT 6.5*  ALBUMIN 2.8*    Cardiac Enzymes No results for input(s): TROPONINI, PROBNP in the last 168 hours.  Glucose  Recent Labs Lab 07/14/16 2057 07/15/16 0005 07/15/16 0412 07/15/16 0833  GLUCAP 180* 142* 121* 104*    Imaging Dg Chest Port 1 View  Result Date: 07/15/2016 CLINICAL DATA:  Post aortobifemoral surgery.  EXAM: PORTABLE CHEST 1 VIEW COMPARISON:  07/14/2016. FINDINGS: Stable support tubes and lines. Normal heart size. Calcified tortuous aorta. Clear lung fields. No pneumothorax. IMPRESSION: Stable chest. Electronically Signed   By: Staci Righter M.D.   On: 07/15/2016 08:35   Dg Chest Port 1 View  Result Date: 07/14/2016 CLINICAL DATA:  ET tube placement EXAM: PORTABLE CHEST 1 VIEW COMPARISON:  06/21/2008 FINDINGS: Endotracheal tube tip is approximately 2.1 cm superior to the carina. Esophageal tube tip overlies proximal stomach with side port near GE junction. Right IJ Swan-Ganz catheter with tip overlying the expected location of right pulmonary artery. Low lung volumes. Mild interstitial opacities. Streaky atelectasis left base. No effusion. Heart size normal. Mildly tortuous aorta with atherosclerosis. No pneumothorax. IMPRESSION: 1. Support lines and tubes as above 2. Low lung volumes with mild left basilar atelectasis 3. Atherosclerosis of the aorta. 4. Esophageal tube tip overlies proximal stomach, side-port is at the GE junction. Electronically Signed   By: Donavan Foil M.D.   On: 07/14/2016 20:50   Dg Abd Portable 1v  Result Date: 07/14/2016 CLINICAL DATA:  Status post aortobifemoral bypass surgery EXAM: PORTABLE ABDOMEN - 1 VIEW COMPARISON:  06/22/2008 FINDINGS: Esophageal tube tip overlies the proximal stomach. Surgical clips overlie the bilateral pelvis. Surgical clips to the left of L3. Nonspecific gas pattern with overall decreased bowel gas. Calcified pelvic phleboliths. Vascular calcifications. No acute osseous abnormality. IMPRESSION: Nonspecific diffuse decreased bowel gas. Esophageal tube tip overlies the stomach, side-port was noted to overlie GE junction on chest radiograph. Electronically Signed   By: Donavan Foil M.D.   On: 07/14/2016 20:52     STUDIES:  ABI 10/17 > Unable to calculate secondary to absent pulses. Aortogram 10/20 > occluded left femoral artery bypass graft. Echo  10/22 > EF 65-70%, G1DD. LHC 10/23 > mild to moderate nonobstructive CAD, normal LV filling pressure, systemic hypertension. Carotid duplex 10/25 > no significant stenosis. cxr 10/27 - clear, no effusion or infiltrates.   CULTURES: None.  ANTIBIOTICS: None.  SIGNIFICANT EVENTS: 10/17 > admitted with nonhealing second toe on left. 10/18 > seen by vascular surgery who felt that symptoms were due to occluded femoral artery bypass graft. 10/20 > aortogram which confirmed the above. 10/21 > cardiology consulted for preop clearance. 10/22 > nuclear stress test revealed stress perfusion defect in the mid inferior wall extending to the base and laterally. 10/23 > LHC. 10/26 > to or for redo of left femoropopliteal bypass > return to ICU on the ventilator > PCCM consulted for vent management.  LINES/TUBES: ETT 10/26 > R IJ swan 10/26 >   DISCUSSION: 65 y.o. female with hx of left CFA to below-knee popliteal artery bypass grafting on 05/30/08. She presented to [facility] ED 07/05/16 with occluded graft. She was taken to the OR 10/26 for redo off bypass graft. She'll return in the ICU on the ventilator therefore PCCM was called for vent management.  ASSESSMENT / PLAN:  CARDIOVASCULAR A:  Occluded left CFA to below-knee popliteal artery bypass grafting - s/p redo 06/2616. Hx HTN, HLD, PVD P:  Postop management per vascular surgery. Continue preadmission ASA. Continue atorvastatin per tube. Hold coreg for now while intubated (started this admission) - can add back after extubation. Hold preadmission HCTZ, clonidine. Restarted lisinopril.   PULMONARY A: VDRF - s/p redo left femoral popliteal bypass graft 06/2616. Tobacco dependence. P:   Currently doing well on CPAP/PS wean/SBT. Likely can be extubated today.   RENAL A:   No acute issues. P:   Continued aceI.  GASTROINTESTINAL A:   GI prophylaxis. Nutrition. P:   SUP: Famotidine. NPO.  HEMATOLOGIC A:   VTE Prophylaxis. P:   SCD's / enoxaparin. CBC in AM.  INFECTIOUS A:   No indication of infection. P:   Monitor clinically.  ENDOCRINE A:   Hyperglycemia - no hx DM.  P:   SSI.  NEUROLOGIC A:   Acute encephalopathy. Hx stroke (2002). P:   Sedation:  Propofol gtt / fentanyl PRN. RASS goal: 0 to -1. Daily WUA.  Family updated: None available.  Interdisciplinary Family Meeting v Palliative Care Meeting:  Due by: 11/2.    Dellia Nims, PGY-3. \   STAFF NOTE: I, Merrie Roof, MD FACP have personally reviewed patient's available data, including medical history, events of note, physical examination and test results as part of my evaluation. I have discussed with resident/NP and other care providers such as pharmacist, RN and RRT. In addition, I personally evaluated patient and elicited key findings of:  Awake, follows commands well, pcxr without infiltrates, lungs with ronchi, pulses are okay by doppler, abdo soft, low output reported = ATN?, 10, cvp low, euvolemic exam, no lasix, NONAG noted, was pos almost 6 liters last 24 hours, urine output noted, will wedge her now to assess volume status now, assess UA, urine na, osm, chem in pm, wean aggressive cpap 5ps5, goal 30 in assess rsbi, mag supp, DVT prevention, have updated family at bedside, HTN noted re add home clonidine patch, add prn labetolol aslo  The patient is critically ill with multiple organ systems failure and requires high complexity decision making for assessment and support, frequent evaluation and titration of therapies, application of advanced monitoring technologies and extensive interpretation of multiple databases.   Critical Care Time devoted to patient care services described in this note is 35 Minutes. This time reflects time of care of this signee: Merrie Roof, MD FACP. This critical care time does not reflect procedure time, or teaching time or supervisory time of PA/NP/Med student/Med Resident etc but could involve  care discussion time. Rest per NP/medical resident whose note is outlined above and that I agree with   Lavon Paganini. Titus Mould, MD, [place] Pgr: [facility] 07/15/2016 10:15 AM

## 2016-07-16 LAB — GLUCOSE, CAPILLARY
GLUCOSE-CAPILLARY: 120 mg/dL — AB (ref 65–99)
GLUCOSE-CAPILLARY: 115 mg/dL — AB (ref 65–99)
Glucose-Capillary: 121 mg/dL — ABNORMAL HIGH (ref 65–99)
Glucose-Capillary: 109 mg/dL — ABNORMAL HIGH (ref 65–99)

## 2016-07-16 MED ORDER — HEPARIN SODIUM (PORCINE) 5000 UNIT/ML IJ SOLN
5000.0000 [IU] | Freq: Three times a day (TID) | INTRAMUSCULAR | Status: DC
  Administered 2016-07-16 – 2016-07-20 (×13): 5000 [IU] via SUBCUTANEOUS
  Filled 2016-07-16 (×13): qty 1

## 2016-07-16 NOTE — Progress Notes (Signed)
Subjective: Interval History: none.. Patient is hungry. No nausea. Has had a bowel movement.  Objective: Vital signs in last 24 hours: Temp:  [98.3 F (36.8 C)-99.3 F (37.4 C)] 98.5 F (36.9 C) (10/28 0818) Pulse Rate:  [80-115] 110 (10/28 0900) Resp:  [11-18] 18 (10/28 0800) BP: (91-160)/(49-94) 108/64 (10/28 0900) SpO2:  [98 %-100 %] 99 % (10/28 0900) FiO2 (%):  [40 %] 40 % (10/27 1000)  Intake/Output from previous day: 10/27 0701 - 10/28 0700 In: 2521.9 [I.V.:2221.9; NG/GT:150; IV Piggyback:150] Out: 986 [Urine:585; Emesis/NG output:401] Intake/Output this shift: Total I/O In: 250 [I.V.:250] Out: -   Abdominal incision groin incisions and popliteal incision are all healing well. No change in gag dry gangrene of her second toe.  Lab Results:  Recent Labs  07/14/16 2038 07/15/16 0415  WBC 19.9* 16.3*  HGB 10.4* 9.2*  HCT 30.3* 26.2*  PLT 116* 98*   BMET  Recent Labs  07/15/16 0415 07/15/16 1239  NA 138 138  K 4.0 4.0  CL 112* 112*  CO2 20* 20*  GLUCOSE 115* 137*  BUN 16 17  CREATININE 0.90 1.19*  CALCIUM 7.8* 7.9*    Studies/Results: Nm Myocar Multi W/spect W/wall Motion / Ef  Result Date: 07/10/2016 CLINICAL DATA:  Chest pain and shortness of breath EXAM: MYOCARDIAL IMAGING WITH SPECT (REST AND PHARMACOLOGIC-STRESS) GATED LEFT VENTRICULAR WALL MOTION STUDY LEFT VENTRICULAR EJECTION FRACTION TECHNIQUE: Standard myocardial SPECT imaging was performed after resting intravenous injection of 10 mCi Tc-53m tetrofosmin. Subsequently, intravenous infusion of Lexiscan was performed under the supervision of the Cardiology staff. At peak effect of the drug, 30 mCi Tc-42m tetrofosmin was injected intravenously and standard myocardial SPECT imaging was performed. Quantitative gated imaging was also performed to evaluate left ventricular wall motion, and estimate left ventricular ejection fraction. COMPARISON:  None available FINDINGS: Perfusion: Stress perfusion defect  present of the mid inferior wall extending to the base and also laterally compatible with inducible ischemia with pharmacologic stress. Wall Motion: Normal left ventricular wall motion. No left ventricular dilation. Left Ventricular Ejection Fraction: 73 % End diastolic volume 41 ml End systolic volume 11 ml IMPRESSION: 1. Positive exam for inferior inducible ischemia extending laterally. 2. Normal left ventricular wall motion. 3. Left ventricular ejection fraction 73% 4. Non invasive risk stratification*: Intermediate to high *2012 Appropriate Use Criteria for Coronary Revascularization Focused Update: J Am Coll Cardiol. 8938;10(1):751-025. http://content.airportbarriers.com.aspx?articleid=1201161 These results will be called to the ordering clinician or representative by the Radiologist Assistant, and communication documented in the PACS or zVision Dashboard. Electronically Signed   By: Jerilynn Mages.  Shick M.D.   On: 07/10/2016 13:32   Dg Chest Port 1 View  Result Date: 07/15/2016 CLINICAL DATA:  Post aortobifemoral surgery. EXAM: PORTABLE CHEST 1 VIEW COMPARISON:  07/14/2016. FINDINGS: Stable support tubes and lines. Normal heart size. Calcified tortuous aorta. Clear lung fields. No pneumothorax. IMPRESSION: Stable chest. Electronically Signed   By: Staci Righter M.D.   On: 07/15/2016 08:35   Dg Chest Port 1 View  Result Date: 07/14/2016 CLINICAL DATA:  ET tube placement EXAM: PORTABLE CHEST 1 VIEW COMPARISON:  06/21/2008 FINDINGS: Endotracheal tube tip is approximately 2.1 cm superior to the carina. Esophageal tube tip overlies proximal stomach with side port near GE junction. Right IJ Swan-Ganz catheter with tip overlying the expected location of right pulmonary artery. Low lung volumes. Mild interstitial opacities. Streaky atelectasis left base. No effusion. Heart size normal. Mildly tortuous aorta with atherosclerosis. No pneumothorax. IMPRESSION: 1. Support lines and tubes as above  2. Low lung volumes  with mild left basilar atelectasis 3. Atherosclerosis of the aorta. 4. Esophageal tube tip overlies proximal stomach, side-port is at the GE junction. Electronically Signed   By: Donavan Foil M.D.   On: 07/14/2016 20:50   Dg Abd Portable 1v  Result Date: 07/14/2016 CLINICAL DATA:  Status post aortobifemoral bypass surgery EXAM: PORTABLE ABDOMEN - 1 VIEW COMPARISON:  06/22/2008 FINDINGS: Esophageal tube tip overlies the proximal stomach. Surgical clips overlie the bilateral pelvis. Surgical clips to the left of L3. Nonspecific gas pattern with overall decreased bowel gas. Calcified pelvic phleboliths. Vascular calcifications. No acute osseous abnormality. IMPRESSION: Nonspecific diffuse decreased bowel gas. Esophageal tube tip overlies the stomach, side-port was noted to overlie GE junction on chest radiograph. Electronically Signed   By: Donavan Foil M.D.   On: 07/14/2016 20:52   Dg Foot Complete Left  Result Date: 07/05/2016 CLINICAL DATA:  Necrotic wound on second toe left foot. EXAM: LEFT FOOT - COMPLETE 3+ VIEW COMPARISON:  Left foot radiograph 05/28/2016 FINDINGS: The bones are osteopenic. There is no fracture or dislocation. Joint spaces are maintained. There is again seen a soft tissue wound of the distal second toe. No focal osteolysis is identified. IMPRESSION: No radiographic evidence of osteomyelitis. Soft tissue wound of the distal aspect of the left second toe. Electronically Signed   By: Ulyses Jarred M.D.   On: 07/05/2016 19:57   Anti-infectives: Anti-infectives    Start     Dose/Rate Route Frequency Ordered Stop   07/14/16 1206  vancomycin (VANCOCIN) 1-5 GM/200ML-% IVPB    Comments:  Gershon Crane   : cabinet override      07/14/16 1206 07/15/16 0014   07/14/16 1130  vancomycin (VANCOCIN) IVPB 1000 mg/200 mL premix  Status:  Discontinued     1,000 mg 200 mL/hr over 60 Minutes Intravenous To ShortStay Surgical 07/13/16 1344 07/14/16 1942      Assessment/Plan: s/p  Procedure(s): AORTOBIFEMORAL BYPASS GRAFT (N/A) REDO BYPASS GRAFT FEMORAL-POPLITEAL ARTERY (Left) Stable for transfer to [facility]. We'll discontinue NG tube. Keep nothing by mouth except for ice chips. We'll begin clears tomorrow.   LOS: 11 days   EarlySherren Mocha 07/16/2016, 9:39 AM

## 2016-07-16 NOTE — Progress Notes (Addendum)
PULMONARY / CRITICAL CARE MEDICINE   Name: Barby Colvard MRN: [id number] DOB: [birth year]    ADMISSION DATE:  07/05/2016 CONSULTATION DATE:  07/14/16  REFERRING MD:  Donzetta Matters  CHIEF COMPLAINT:  VDRF post op  HISTORY OF PRESENT ILLNESS:  Pt is encephelopathic; therefore, this HPI is obtained from chart review. Trinitee Horgan is a 65 y.o. female with PMH as outlined below including but not limited to left CFA to below-knee popliteal artery bypass grafting on 05/30/08. She presented to [facility] ED 07/05/16 with necrotic left second toe.  She was evaluated by vascular surgery the following day who felt that she likely had an occluded bypass graft. She had an aortogram 07/08/16 which confirmed occluded left common femoral artery. She was seen by cardiology, had left heart cath and was cleared for surgery.On 10/26, she was taken to the OR for redo of the leftfemoropopliteal artery bypass graft.  Following the procedure, she returned to the ICU on the ventilator. PCCM was consulted for management.  SUBJECTIVE: extubated, no pressors  VITAL SIGNS: BP (!) 102/57   Pulse (!) 110   Temp 98.5 F (36.9 C) (Oral)   Resp 13   Ht 5\' 4"  (1.626 m)   Wt 65 kg (143 lb 4.8 oz)   SpO2 100%   BMI 24.60 kg/m   HEMODYNAMICS: PAP: (16-20)/(9-11) 20/11 CVP:  [2 mmHg-4 mmHg] 4 mmHg  VENTILATOR SETTINGS: FiO2 (%):  [40 %] 40 %  INTAKE / OUTPUT: I/O last 3 completed shifts: In: 4369.4 [I.V.:3829.4; NG/GT:290; IV Piggyback:250] Out: 2952 [Urine:1022; Emesis/NG output:701]   PHYSICAL EXAMINATION: General: Middle-aged female, in NAD. Neuro: calm , fc, nonfocal HEENT: jvd increased Cardiovascular: RRR, no M/R/G.  Lungs: CTA Abdomen: Abdominal midline incision dry , no r/g Musculoskeletal: Surgical incision C/D/I. No gross deformities, no edema.  Dry gangrene seen on left 2nd toe. exts warm to touch.  Skin: Intact, warm, no rashes.  LABS:  BMET  Recent Labs Lab 07/14/16 2038 07/15/16 0415  07/15/16 1239  NA 135 138 138  K 3.7 4.0 4.0  CL 108 112* 112*  CO2 21* 20* 20*  BUN 14 16 17   CREATININE 0.71 0.90 1.19*  GLUCOSE 192* 115* 137*    Electrolytes  Recent Labs Lab 07/14/16 0234 07/14/16 2038 07/15/16 0415 07/15/16 0426 07/15/16 1239  CALCIUM  --  7.6* 7.8*  --  7.9*  MG 1.2* 0.8*  --  1.4*  --   PHOS  --   --   --  3.8  --     CBC  Recent Labs Lab 07/14/16 0234  07/14/16 1819 07/14/16 2038 07/15/16 0415  WBC 8.7  --   --  19.9* 16.3*  HGB 9.6*  < > 7.5* 10.4* 9.2*  HCT 28.8*  < > 22.0* 30.3* 26.2*  PLT 309  --   --  116* 98*  < > = values in this interval not displayed.  Coag's  Recent Labs Lab 07/14/16 2038  APTT 33  INR 1.43    Sepsis Markers No results for input(s): LATICACIDVEN, PROCALCITON, O2SATVEN in the last 168 hours.  ABG  Recent Labs Lab 07/14/16 1819 07/14/16 2045 07/15/16 0413  PHART 7.357 7.388 7.369  PCO2ART 39.8 39.0 35.5  PO2ART 224.0* 554.0* 179.0*    Liver Enzymes  Recent Labs Lab 07/15/16 0415  AST 24  ALT 18  ALKPHOS 31*  BILITOT 6.5*  ALBUMIN 2.8*    Cardiac Enzymes No results for input(s): TROPONINI, PROBNP in the last 168 hours.  Glucose  Recent Labs Lab 07/15/16 0412 07/15/16 0833 07/15/16 1204 07/15/16 1607 07/15/16 1946 07/15/16 2329  GLUCAP 121* 104* 132* 113* 128* 133*    Imaging No results found.   STUDIES:  ABI 10/17 > Unable to calculate secondary to absent pulses. Aortogram 10/20 > occluded left femoral artery bypass graft. Echo 10/22 > EF 65-70%, G1DD. LHC 10/23 > mild to moderate nonobstructive CAD, normal LV filling pressure, systemic hypertension. Carotid duplex 10/25 > no significant stenosis. cxr 10/27 - clear, no effusion or infiltrates.   CULTURES: None.  ANTIBIOTICS: None.  SIGNIFICANT EVENTS: 10/17 > admitted with nonhealing second toe on left. 10/18 > seen by vascular surgery who felt that symptoms were due to occluded femoral artery bypass  graft. 10/20 > aortogram which confirmed the above. 10/21 > cardiology consulted for preop clearance. 10/22 > nuclear stress test revealed stress perfusion defect in the mid inferior wall extending to the base and laterally. 10/23 > LHC. 10/26 > to or for redo of left femoropopliteal bypass > return to ICU on the ventilator > PCCM consulted for vent management. Extubated  LINES/TUBES: ETT 10/26 >10/26 R IJ swan 10/26 >10/26   DISCUSSION: 65 y.o. female with hx of left CFA to below-knee popliteal artery bypass grafting on 05/30/08. She presented to [facility] ED 07/05/16 with occluded graft. She was taken to the OR 10/26 for redo off bypass graft. She'll return in the ICU on the ventilator therefore PCCM was called for vent management.  ASSESSMENT / PLAN:  CARDIOVASCULAR A:  Occluded left CFA to below-knee popliteal artery bypass grafting - s/p redo 06/2616. Hx HTN, HLD, PVD P:  Postop management per vascular surgery. Continue preadmission ASA. Continue atorvastatin per tube. Hold preadmission HCTZ, Restarted lisinopril.  Coreg on hold for sys 100 and no oral access as of yet Clonidine tolerated  PULMONARY A: VDRF - s/p redo left femoral popliteal bypass graft 06/2616. Tobacco dependence. P:   IS  RENAL A:   ATN, ARF Hyperchlroemia, small NONAG P:   No acei volume given, keep pos Urine output increased Chem in am  1/2 NS Avoid saline  GASTROINTESTINAL A:   GI prophylaxis. Nutrition. P:   SUP: Famotidine. Advance diet likely, per surg NGT per surgery  HEMATOLOGIC A:   VTE Prophylaxis. P:  SCD's Dc enoxaparin, add sub q hep with crt rise CBC in AM   ENDOCRINE A:   Hyperglycemia - no hx DM.  P:   SSI.  NEUROLOGIC A:   Acute encephalopathy. Hx stroke (2002). P:   PT OT noted  Family updated: None available.  Interdisciplinary Family Meeting v Palliative Care Meeting:  Due by: 11/2.  To 2W, will sign off, call if needed  Lavon Paganini. Titus Mould, MD,  [facility] Pgr: [facility] 07/16/2016 8:40 AM

## 2016-07-17 DIAGNOSIS — E8770 Fluid overload, unspecified: Secondary | ICD-10-CM

## 2016-07-17 DIAGNOSIS — I10 Essential (primary) hypertension: Secondary | ICD-10-CM

## 2016-07-17 DIAGNOSIS — E876 Hypokalemia: Secondary | ICD-10-CM

## 2016-07-17 LAB — COMPREHENSIVE METABOLIC PANEL
ALK PHOS: 78 U/L (ref 38–126)
ALT: 43 U/L (ref 14–54)
AST: 68 U/L — ABNORMAL HIGH (ref 15–41)
Albumin: 2.1 g/dL — ABNORMAL LOW (ref 3.5–5.0)
Anion gap: 8 (ref 5–15)
BUN: 14 mg/dL (ref 6–20)
CALCIUM: 7.9 mg/dL — AB (ref 8.9–10.3)
CO2: 21 mmol/L — AB (ref 22–32)
CREATININE: 0.99 mg/dL (ref 0.44–1.00)
Chloride: 109 mmol/L (ref 101–111)
GFR calc non Af Amer: 59 mL/min — ABNORMAL LOW (ref >60)
Glucose, Bld: 114 mg/dL — ABNORMAL HIGH (ref 65–99)
Potassium: 3.4 mmol/L — ABNORMAL LOW (ref 3.5–5.1)
SODIUM: 138 mmol/L (ref 135–145)
Total Bilirubin: 2.8 mg/dL — ABNORMAL HIGH (ref 0.3–1.2)
Total Protein: 4.7 g/dL — ABNORMAL LOW (ref 6.5–8.1)

## 2016-07-17 LAB — CBC WITH DIFFERENTIAL/PLATELET
BASOS PCT: 0 %
Basophils Absolute: 0 10*3/uL (ref 0.0–0.1)
EOS ABS: 0 10*3/uL (ref 0.0–0.7)
EOS PCT: 0 %
HCT: 20.3 % — ABNORMAL LOW (ref 36.0–46.0)
HEMOGLOBIN: 7 g/dL — AB (ref 12.0–15.0)
Lymphocytes Relative: 12 %
Lymphs Abs: 1.9 10*3/uL (ref 0.7–4.0)
MCH: 28.3 pg (ref 26.0–34.0)
MCHC: 34.5 g/dL (ref 30.0–36.0)
MCV: 82.2 fL (ref 78.0–100.0)
MONO ABS: 1.7 10*3/uL — AB (ref 0.1–1.0)
MONOS PCT: 11 %
NEUTROS PCT: 78 %
Neutro Abs: 12.8 10*3/uL — ABNORMAL HIGH (ref 1.7–7.7)
PLATELETS: 135 10*3/uL — AB (ref 150–400)
RBC: 2.47 MIL/uL — ABNORMAL LOW (ref 3.87–5.11)
RDW: 17.7 % — AB (ref 11.5–15.5)
WBC: 16.4 10*3/uL — ABNORMAL HIGH (ref 4.0–10.5)

## 2016-07-17 LAB — OCCULT BLOOD X 1 CARD TO LAB, STOOL: Fecal Occult Bld: NEGATIVE

## 2016-07-17 LAB — GLUCOSE, CAPILLARY
Glucose-Capillary: 120 mg/dL — ABNORMAL HIGH (ref 65–99)
Glucose-Capillary: 112 mg/dL — ABNORMAL HIGH (ref 65–99)
Glucose-Capillary: 102 mg/dL — ABNORMAL HIGH (ref 65–99)
Glucose-Capillary: 104 mg/dL — ABNORMAL HIGH (ref 65–99)
Glucose-Capillary: 105 mg/dL — ABNORMAL HIGH (ref 65–99)
Glucose-Capillary: 104 mg/dL — ABNORMAL HIGH (ref 65–99)

## 2016-07-17 LAB — C DIFFICILE QUICK SCREEN W PCR REFLEX
C DIFFICILE (CDIFF) TOXIN: NEGATIVE
C DIFFICLE (CDIFF) ANTIGEN: NEGATIVE
C Diff interpretation: NOT DETECTED

## 2016-07-17 LAB — PREPARE RBC (CROSSMATCH)

## 2016-07-17 MED ORDER — HYDROCHLOROTHIAZIDE 25 MG PO TABS
25.0000 mg | ORAL_TABLET | Freq: Every day | ORAL | Status: DC
  Administered 2016-07-17 – 2016-07-20 (×4): 25 mg via ORAL
  Filled 2016-07-17 (×4): qty 1

## 2016-07-17 MED ORDER — POTASSIUM CHLORIDE CRYS ER 20 MEQ PO TBCR
40.0000 meq | EXTENDED_RELEASE_TABLET | Freq: Three times a day (TID) | ORAL | Status: AC
  Administered 2016-07-17 (×2): 40 meq via ORAL
  Filled 2016-07-17 (×2): qty 2

## 2016-07-17 NOTE — Progress Notes (Signed)
15 mg of Hydrocodone via PCA wasted via sink with Beulah Gandy, RN, BSN

## 2016-07-17 NOTE — Progress Notes (Signed)
PULMONARY / CRITICAL CARE MEDICINE   Name: Wendy Townsend MRN: [id number] DOB: Dec 25, 1950    ADMISSION DATE:  07/05/2016 CONSULTATION DATE:  07/14/16  REFERRING MD:  Donzetta Matters  CHIEF COMPLAINT:  VDRF post op  HISTORY OF PRESENT ILLNESS:  Pt is encephelopathic; therefore, this HPI is obtained from chart review. Wendy Townsend is a 65 y.o. female with PMH as outlined below including but not limited to left CFA to below-knee popliteal artery bypass grafting on 05/30/08. She presented to [facility] ED 07/05/16 with necrotic left second toe.  She was evaluated by vascular surgery the following day who felt that she likely had an occluded bypass graft. She had an aortogram 07/08/16 which confirmed occluded left common femoral artery. She was seen by cardiology, had left heart cath and was cleared for surgery.On 10/26, she was taken to the OR for redo of the leftfemoropopliteal artery bypass graft.  Following the procedure, she returned to the ICU on the ventilator. PCCM was consulted for management.  SUBJECTIVE: No events overnight  VITAL SIGNS: BP (!) 133/55 (BP Location: Right Arm)   Pulse (!) 108   Temp 97.8 F (36.6 C) (Axillary)   Resp 10   Ht 5\' 4"  (1.626 m)   Wt 65 kg (143 lb 4.8 oz)   SpO2 100%   BMI 24.60 kg/m   HEMODYNAMICS:    VENTILATOR SETTINGS:    INTAKE / OUTPUT: I/O last 3 completed shifts: In: 9163 [I.V.:3400; NG/GT:60; IV Piggyback:100] Out: 1891 [WGYKZ:9935; Emesis/NG output:301]   PHYSICAL EXAMINATION: General: Middle-aged female, in NAD. Neuro: calm , fc, nonfocal HEENT: JVD increased Cardiovascular: RRR, no M/R/G.  Lungs: CTA B Abdomen: Abdominal midline incision dry , no r/g Musculoskeletal: Surgical incision C/D/I. No gross deformities, no edema.  Dry gangrene seen on left 2nd toe. exts warm to touch.  Skin: Intact, warm, no rashes.  LABS:  BMET  Recent Labs Lab 07/15/16 0415 07/15/16 1239 07/17/16 0221  NA 138 138 138  K 4.0 4.0 3.4*  CL 112*  112* 109  CO2 20* 20* 21*  BUN 16 17 14   CREATININE 0.90 1.19* 0.99  GLUCOSE 115* 137* 114*    Electrolytes  Recent Labs Lab 07/14/16 0234 07/14/16 2038 07/15/16 0415 07/15/16 0426 07/15/16 1239 07/17/16 0221  CALCIUM  --  7.6* 7.8*  --  7.9* 7.9*  MG 1.2* 0.8*  --  1.4*  --   --   PHOS  --   --   --  3.8  --   --     CBC  Recent Labs Lab 07/14/16 2038 07/15/16 0415 07/17/16 0221  WBC 19.9* 16.3* 16.4*  HGB 10.4* 9.2* 7.0*  HCT 30.3* 26.2* 20.3*  PLT 116* 98* 135*    Coag's  Recent Labs Lab 07/14/16 2038  APTT 33  INR 1.43    Sepsis Markers No results for input(s): LATICACIDVEN, PROCALCITON, O2SATVEN in the last 168 hours.  ABG  Recent Labs Lab 07/14/16 1819 07/14/16 2045 07/15/16 0413  PHART 7.357 7.388 7.369  PCO2ART 39.8 39.0 35.5  PO2ART 224.0* 554.0* 179.0*    Liver Enzymes  Recent Labs Lab 07/15/16 0415 07/17/16 0221  AST 24 68*  ALT 18 43  ALKPHOS 31* 78  BILITOT 6.5* 2.8*  ALBUMIN 2.8* 2.1*    Cardiac Enzymes No results for input(s): TROPONINI, PROBNP in the last 168 hours.  Glucose  Recent Labs Lab 07/16/16 1904 07/16/16 2046 07/17/16 0136 07/17/16 0510 07/17/16 0815 07/17/16 1149  GLUCAP 115* 109* 120* 112* 102* 104*  Imaging No results found.   STUDIES:  ABI 10/17 > Unable to calculate secondary to absent pulses. Aortogram 10/20 > occluded left femoral artery bypass graft. Echo 10/22 > EF 65-70%, G1DD. LHC 10/23 > mild to moderate nonobstructive CAD, normal LV filling pressure, systemic hypertension. Carotid duplex 10/25 > no significant stenosis. cxr 10/27 - clear, no effusion or infiltrates.   CULTURES: None.  ANTIBIOTICS: None.  SIGNIFICANT EVENTS: 10/17 > admitted with nonhealing second toe on left. 10/18 > seen by vascular surgery who felt that symptoms were due to occluded femoral artery bypass graft. 10/20 > aortogram which confirmed the above. 10/21 > cardiology consulted for preop  clearance. 10/22 > nuclear stress test revealed stress perfusion defect in the mid inferior wall extending to the base and laterally. 10/23 > LHC. 10/26 > to or for redo of left femoropopliteal bypass > return to ICU on the ventilator > PCCM consulted for vent management. Extubated 10/29 transfused 2 units pRBC.  LINES/TUBES: ETT 10/26 >10/26 R IJ swan 10/26 >10/26  I reviewed CXR myself, no acute disease noted.  DISCUSSION: 65 y.o. female with hx of left CFA to below-knee popliteal artery bypass grafting on 05/30/08. She presented to [facility] ED 07/05/16 with occluded graft. She was taken to the OR 10/26 for redo off bypass graft. She'll return in the ICU on the ventilator therefore PCCM was called for vent management.  ASSESSMENT / PLAN:  CARDIOVASCULAR A:  Occluded left CFA to below-knee popliteal artery bypass grafting - s/p redo 06/2616. Hx HTN, HLD, PVD P:  Continue preadmission ASA. Continue atorvastatin. Restart HCTZ and lisinopril  Coreg on hold for sys 100 and no oral access as of yet Clonidine tolerated  PULMONARY A: VDRF - s/p redo left femoral popliteal bypass graft 06/2616. Tobacco dependence. P:   IS Titrate O2 for sat of 88-92% Mobilize as able.  RENAL A:   ATN, ARF Hyperchlroemia, small NONAG P:   No ACEi KVO IVF HCTZ Chem in am   GASTROINTESTINAL A:   GI prophylaxis. Nutrition. P:   SUP: Famotidine. Advance diet per surgery NGT per surgery  HEMATOLOGIC A:   VTE Prophylaxis. P:  SCD's Dc enoxaparin, add sub q hep with crt rise CBC in AM  ENDOCRINE A:   Hyperglycemia - no hx DM.  P:   SSI.  NEUROLOGIC A:   Acute encephalopathy. Hx stroke (2002). P:   PT OT noted  Family updated: None available.  Interdisciplinary Family Meeting v Palliative Care Meeting:  Due by: 11/2.  Discussed with PCCM-NP and TRH-MD.  PCCM will sign off and TRH to pick up on 10/30.  Rush Farmer, M.D. [facility] Pulmonary/Critical Care Medicine. Pager:  [phone]. After hours pager: [phone].  07/17/2016 1:42 PM

## 2016-07-17 NOTE — Progress Notes (Signed)
Subjective: Interval History: none.. No nausea with NG output for 24 hours. Still with diarrhea  Objective: Vital signs in last 24 hours: Temp:  [97.8 F (36.6 C)-98.9 F (37.2 C)] 97.8 F (36.6 C) (10/29 0507) Pulse Rate:  [73-114] 108 (10/29 0507) Resp:  [10-20] 10 (10/29 0800) BP: (94-133)/(52-55) 133/55 (10/29 0507) SpO2:  [95 %-100 %] 100 % (10/29 0800)  Intake/Output from previous day: 10/28 0701 - 10/29 0700 In: 1950 [I.V.:1900; IV Piggyback:50] Out: 1200 [Urine:1200] Intake/Output this shift: Total I/O In: -  Out: 1050 [Urine:1050]  Abdomen soft and nontender. Palpable femoral pulses bilaterally. No change in her left foot dry gangrene  Lab Results:  Recent Labs  07/15/16 0415 07/17/16 0221  WBC 16.3* 16.4*  HGB 9.2* 7.0*  HCT 26.2* 20.3*  PLT 98* 135*   BMET  Recent Labs  07/15/16 1239 07/17/16 0221  NA 138 138  K 4.0 3.4*  CL 112* 109  CO2 20* 21*  GLUCOSE 137* 114*  BUN 17 14  CREATININE 1.19* 0.99  CALCIUM 7.9* 7.9*    Studies/Results: Nm Myocar Multi W/spect W/wall Motion / Ef  Result Date: 07/10/2016 CLINICAL DATA:  Chest pain and shortness of breath EXAM: MYOCARDIAL IMAGING WITH SPECT (REST AND PHARMACOLOGIC-STRESS) GATED LEFT VENTRICULAR WALL MOTION STUDY LEFT VENTRICULAR EJECTION FRACTION TECHNIQUE: Standard myocardial SPECT imaging was performed after resting intravenous injection of 10 mCi Tc-46m tetrofosmin. Subsequently, intravenous infusion of Lexiscan was performed under the supervision of the Cardiology staff. At peak effect of the drug, 30 mCi Tc-36m tetrofosmin was injected intravenously and standard myocardial SPECT imaging was performed. Quantitative gated imaging was also performed to evaluate left ventricular wall motion, and estimate left ventricular ejection fraction. COMPARISON:  None available FINDINGS: Perfusion: Stress perfusion defect present of the mid inferior wall extending to the base and also laterally compatible with  inducible ischemia with pharmacologic stress. Wall Motion: Normal left ventricular wall motion. No left ventricular dilation. Left Ventricular Ejection Fraction: 73 % End diastolic volume 41 ml End systolic volume 11 ml IMPRESSION: 1. Positive exam for inferior inducible ischemia extending laterally. 2. Normal left ventricular wall motion. 3. Left ventricular ejection fraction 73% 4. Non invasive risk stratification*: Intermediate to high *2012 Appropriate Use Criteria for Coronary Revascularization Focused Update: J Am Coll Cardiol. 7371;06(2):694-854. http://content.airportbarriers.com.aspx?articleid=1201161 These results will be called to the ordering clinician or representative by the Radiologist Assistant, and communication documented in the PACS or zVision Dashboard. Electronically Signed   By: Jerilynn Mages.  Shick M.D.   On: 07/10/2016 13:32   Dg Chest Port 1 View  Result Date: 07/15/2016 CLINICAL DATA:  Post aortobifemoral surgery. EXAM: PORTABLE CHEST 1 VIEW COMPARISON:  07/14/2016. FINDINGS: Stable support tubes and lines. Normal heart size. Calcified tortuous aorta. Clear lung fields. No pneumothorax. IMPRESSION: Stable chest. Electronically Signed   By: Staci Righter M.D.   On: 07/15/2016 08:35   Dg Chest Port 1 View  Result Date: 07/14/2016 CLINICAL DATA:  ET tube placement EXAM: PORTABLE CHEST 1 VIEW COMPARISON:  06/21/2008 FINDINGS: Endotracheal tube tip is approximately 2.1 cm superior to the carina. Esophageal tube tip overlies proximal stomach with side port near GE junction. Right IJ Swan-Ganz catheter with tip overlying the expected location of right pulmonary artery. Low lung volumes. Mild interstitial opacities. Streaky atelectasis left base. No effusion. Heart size normal. Mildly tortuous aorta with atherosclerosis. No pneumothorax. IMPRESSION: 1. Support lines and tubes as above 2. Low lung volumes with mild left basilar atelectasis 3. Atherosclerosis of the aorta. 4. Esophageal  tube tip  overlies proximal stomach, side-port is at the GE junction. Electronically Signed   By: Donavan Foil M.D.   On: 07/14/2016 20:50   Dg Abd Portable 1v  Result Date: 07/14/2016 CLINICAL DATA:  Status post aortobifemoral bypass surgery EXAM: PORTABLE ABDOMEN - 1 VIEW COMPARISON:  06/22/2008 FINDINGS: Esophageal tube tip overlies the proximal stomach. Surgical clips overlie the bilateral pelvis. Surgical clips to the left of L3. Nonspecific gas pattern with overall decreased bowel gas. Calcified pelvic phleboliths. Vascular calcifications. No acute osseous abnormality. IMPRESSION: Nonspecific diffuse decreased bowel gas. Esophageal tube tip overlies the stomach, side-port was noted to overlie GE junction on chest radiograph. Electronically Signed   By: Donavan Foil M.D.   On: 07/14/2016 20:52   Dg Foot Complete Left  Result Date: 07/05/2016 CLINICAL DATA:  Necrotic wound on second toe left foot. EXAM: LEFT FOOT - COMPLETE 3+ VIEW COMPARISON:  Left foot radiograph 05/28/2016 FINDINGS: The bones are osteopenic. There is no fracture or dislocation. Joint spaces are maintained. There is again seen a soft tissue wound of the distal second toe. No focal osteolysis is identified. IMPRESSION: No radiographic evidence of osteomyelitis. Soft tissue wound of the distal aspect of the left second toe. Electronically Signed   By: Ulyses Jarred M.D.   On: 07/05/2016 19:57   Anti-infectives: Anti-infectives    Start     Dose/Rate Route Frequency Ordered Stop   07/14/16 1206  vancomycin (VANCOCIN) 1-5 GM/200ML-% IVPB    Comments:  Gershon Crane   : cabinet override      07/14/16 1206 07/15/16 0014   07/14/16 1130  vancomycin (VANCOCIN) IVPB 1000 mg/200 mL premix  Status:  Discontinued     1,000 mg 200 mL/hr over 60 Minutes Intravenous To ShortStay Surgical 07/13/16 1344 07/14/16 1942      Assessment/Plan: s/p Procedure(s): AORTOBIFEMORAL BYPASS GRAFT (N/A) REDO BYPASS GRAFT FEMORAL-POPLITEAL ARTERY  (Left) We'll begin clear liquids. Had the great deal of volume resuscitation following surgery. Hematocrit 20. Will transfuse 2 units packed red blood cells.   LOS: 12 days   Viral Schramm 07/17/2016, 11:08 AM

## 2016-07-18 DIAGNOSIS — N189 Chronic kidney disease, unspecified: Secondary | ICD-10-CM

## 2016-07-18 DIAGNOSIS — N179 Acute kidney failure, unspecified: Secondary | ICD-10-CM

## 2016-07-18 DIAGNOSIS — Z5189 Encounter for other specified aftercare: Secondary | ICD-10-CM

## 2016-07-18 LAB — TYPE AND SCREEN
ABO/RH(D): B POS
ANTIBODY SCREEN: NEGATIVE
UNIT DIVISION: 0
Unit division: 0
Unit division: 0
Unit division: 0

## 2016-07-18 LAB — MAGNESIUM: MAGNESIUM: 1.4 mg/dL — AB (ref 1.7–2.4)

## 2016-07-18 LAB — PHOSPHORUS: Phosphorus: 1.7 mg/dL — ABNORMAL LOW (ref 2.5–4.6)

## 2016-07-18 LAB — COMPREHENSIVE METABOLIC PANEL
ALBUMIN: 2.4 g/dL — AB (ref 3.5–5.0)
ALT: 49 U/L (ref 14–54)
AST: 72 U/L — AB (ref 15–41)
Alkaline Phosphatase: 93 U/L (ref 38–126)
Anion gap: 8 (ref 5–15)
BUN: 9 mg/dL (ref 6–20)
CHLORIDE: 110 mmol/L (ref 101–111)
CO2: 22 mmol/L (ref 22–32)
CREATININE: 0.91 mg/dL (ref 0.44–1.00)
Calcium: 8.2 mg/dL — ABNORMAL LOW (ref 8.9–10.3)
GFR calc Af Amer: >60 mL/min (ref >60)
GFR calc non Af Amer: >60 mL/min (ref >60)
GLUCOSE: 115 mg/dL — AB (ref 65–99)
POTASSIUM: 3.7 mmol/L (ref 3.5–5.1)
Sodium: 140 mmol/L (ref 135–145)
Total Bilirubin: 4.2 mg/dL — ABNORMAL HIGH (ref 0.3–1.2)
Total Protein: 5.2 g/dL — ABNORMAL LOW (ref 6.5–8.1)

## 2016-07-18 LAB — GLUCOSE, CAPILLARY
GLUCOSE-CAPILLARY: 108 mg/dL — AB (ref 65–99)
GLUCOSE-CAPILLARY: 122 mg/dL — AB (ref 65–99)
GLUCOSE-CAPILLARY: 102 mg/dL — AB (ref 65–99)
Glucose-Capillary: 103 mg/dL — ABNORMAL HIGH (ref 65–99)
Glucose-Capillary: 106 mg/dL — ABNORMAL HIGH (ref 65–99)
Glucose-Capillary: 119 mg/dL — ABNORMAL HIGH (ref 65–99)

## 2016-07-18 LAB — CBC
HCT: 30.7 % — ABNORMAL LOW (ref 36.0–46.0)
Hemoglobin: 10.1 g/dL — ABNORMAL LOW (ref 12.0–15.0)
MCH: 28.4 pg (ref 26.0–34.0)
MCHC: 32.9 g/dL (ref 30.0–36.0)
MCV: 86.2 fL (ref 78.0–100.0)
PLATELETS: 159 10*3/uL (ref 150–400)
RBC: 3.56 MIL/uL — AB (ref 3.87–5.11)
RDW: 16.8 % — AB (ref 11.5–15.5)
WBC: 13.4 10*3/uL — AB (ref 4.0–10.5)

## 2016-07-18 LAB — LIPASE, BLOOD: Lipase: 31 U/L (ref 11–51)

## 2016-07-18 MED ORDER — CARVEDILOL 3.125 MG PO TABS
3.1250 mg | ORAL_TABLET | Freq: Two times a day (BID) | ORAL | Status: DC
  Administered 2016-07-18 – 2016-07-20 (×4): 3.125 mg via ORAL
  Filled 2016-07-18 (×4): qty 1

## 2016-07-18 MED ORDER — SACCHAROMYCES BOULARDII 250 MG PO CAPS
250.0000 mg | ORAL_CAPSULE | Freq: Two times a day (BID) | ORAL | Status: DC
  Administered 2016-07-18 – 2016-07-20 (×5): 250 mg via ORAL
  Filled 2016-07-18 (×5): qty 1

## 2016-07-18 MED ORDER — MAGNESIUM SULFATE 2 GM/50ML IV SOLN
2.0000 g | Freq: Once | INTRAVENOUS | Status: AC
  Administered 2016-07-18: 2 g via INTRAVENOUS
  Filled 2016-07-18: qty 50

## 2016-07-18 MED ORDER — OXYCODONE HCL ER 15 MG PO T12A
15.0000 mg | EXTENDED_RELEASE_TABLET | Freq: Two times a day (BID) | ORAL | Status: DC
  Administered 2016-07-18 – 2016-07-20 (×4): 15 mg via ORAL
  Filled 2016-07-18 (×4): qty 1

## 2016-07-18 MED ORDER — OXYCODONE HCL 5 MG PO TABS: 10.0000 mg | ORAL_TABLET | ORAL | Status: DC | PRN

## 2016-07-18 MED ORDER — HYDROMORPHONE HCL 1 MG/ML IJ SOLN: 1.0000 mg | INTRAMUSCULAR | Status: DC | PRN

## 2016-07-18 NOTE — Progress Notes (Addendum)
[facility] PROGRESS NOTE  Wendy Townsend IEP:[id number] DOB: 1950/11/23 DOA: 07/05/2016   PCP: Gildardo Cranker, DO     Assessment/Plan: Principal Problem:   Dry gangrene ([place]) Active Problems:   Hypertension   Hyperlipidemia   PAD (peripheral artery disease) ([place])   Tobacco use disorder   Ischemic pain of left foot   Hyperglycemia   Acute kidney injury superimposed on chronic kidney disease ([place])   Protein-calorie malnutrition, severe   Abnormal stress test   Dyspnea   Acute renal failure superimposed on stage 3 chronic kidney disease (HCC)   Essential hypertension   Impaired glucose tolerance   Ischemic pain of foot, left   Status post aortobifemoral bypass surgery   Acute respiratory failure with hypoxia (HCC)   Rise Minney is a 65 y.o. female with PMH as outlined below including but not limited to left CFA to below-knee popliteal artery bypass grafting on 05/30/08. She presented to [facility] ED 07/05/16 with necrotic left second toe.  She was evaluated by vascular surgery the following day who felt that she likely had an occluded bypass graft. She had an aortogram 07/08/16 which confirmed occluded left common femoral artery. She was seen by cardiology, had left heart cath and was cleared for surgery.On 10/26, she was taken to the OR for redo of the leftfemoropopliteal artery bypass graft.  Following the procedure, she returned to the ICU on the ventilator. PCCM was consulted for management. Transfer to [facility] 10/30  Assessment and plan Occluded left CFA to below-knee popliteal artery bypass grafting - s/p redo 06/2616. Hx HTN, HLD, PVD -ABI's are much improved post op, patient is still on a Dilaudid PCA Continue preadmission ASA. Continue atorvastatin. Continue HCTZ and clonidine Will start patient on low-dose Coreg for tachycardia Clonidine tolerated  Surgery clearance -she was considered high risk due to extensive PAD -Non-invasive stress test demonstrated  intermediate high risk -echo w/o wall motion abnormalities, preserved EF and demonstrating grade 1 diastolic HF -nonobstructive CAD appreciated in her LHC 10/23    HLD -Recent lipid profile (06/08/16) shows LDL 32, HDL 53 -Simvastatin exchanged to lipitor 80 mg daily as per recommendations from cardiology  VDRF - s/p redo left femoral popliteal bypass graft 06/2616. Extubated Tobacco dependence. Currently 100% on 2 L Mobilize as able.  AKI on CKD: stage 3 at baseline  -most Recent creatinine 0.87 (back to baseline), creatinine now 0.99 -will continue holding nephrotoxic agents  -check BMET intermittently to monitor trend    Abdominal pain with abnormal liver function, Stool studies negative for C. difficile Will start patient on a probiotic Lipase within normal limits, AST 72, bilirubin 4.2 Right upper quadrant ultrasound and HIDA scan ordered and pending Continue clear liquid diet for now  Acute blood loss anemia Continue heparin for DVT prophylaxis, status post 2 units of packed red blood cells 10/29, Continue to follow CBC  Hyperglycemia -A1C 6.2 -will monitor CBG's intermittently and if > 200 will start SSI Speech therapy consult, then start carb modified diet    Acute encephalopathy. Hx stroke (2002). PT OT consult  Protein calorie malnutrition: severe -nutrition supplements as per nutritional service recommendations   Postop fever-continue incentive spirometry, still has some leukocytosis Chest x-ray 10/27 negative, Foley catheter discontinued UA 10/27, negative  DVT prophylaxsis heparin  Code Status:  Full code     Family Communication: Discussed in detail with the patient, all imaging results, lab results explained to the patient   Disposition Plan:   Anticipate discharge  in 2-3 days     Consultants:  Vascular surgery  Cardiology  STUDIES:  ABI 10/17 > Unable to calculate secondary to absent pulses. Aortogram 10/20 > occluded left femoral  artery bypass graft. Echo 10/22 > EF 65-70%, G1DD. LHC 10/23 > mild to moderate nonobstructive CAD, normal LV filling pressure, systemic hypertension. Carotid duplex 10/25 > no significant stenosis. cxr 10/27 - clear, no effusion or infiltrates.      ANTIBIOTICS: None.  SIGNIFICANT EVENTS: 10/17 > admitted with nonhealing second toe on left. 10/18 > seen by vascular surgery who felt that symptoms were due to occluded femoral artery bypass graft. 10/20 > aortogram which confirmed the above. 10/21 > cardiology consulted for preop clearance. 10/22 > nuclear stress test revealed stress perfusion defect in the mid inferior wall extending to the base and laterally. 10/23 > LHC. 10/26 > to or for redo of left femoropopliteal bypass > return to ICU on the ventilator > PCCM consulted for vent management. Extubated 10/29 transfused 2 units pRBC.  LINES/TUBES: ETT 10/26 >10/26 R IJ swan 10/26 >10/26  Antibiotics: Anti-infectives    Start     Dose/Rate Route Frequency Ordered Stop   07/14/16 1206  vancomycin (VANCOCIN) 1-5 GM/200ML-% IVPB    Comments:  Wendy Townsend, Wendy Townsend   : cabinet override      07/14/16 1206 07/15/16 0014   07/14/16 1130  vancomycin (VANCOCIN) IVPB 1000 mg/200 mL premix  Status:  Discontinued     1,000 mg 200 mL/hr over 60 Minutes Intravenous To [facility] 07/13/16 1344 07/14/16 1942         HPI/Subjective: Complaining of epigastric pain, denies any diarrhea  Objective: Vitals:   07/18/16 0051 07/18/16 0206 07/18/16 0429 07/18/16 0433  BP:  124/68 (!) 145/62   Pulse:  100 95   Resp: 16 18 18 18   Temp:  100 F (37.8 C) 98.7 F (37.1 C)   TempSrc:  Oral Oral   SpO2: 100% 100% 100% 100%  Weight:      Height:        Intake/Output Summary (Last 24 hours) at 07/18/16 0751 Last data filed at 07/18/16 0438  Gross per 24 hour  Intake          1168.17 ml  Output             2165 ml  Net          -996.83 ml    Exam:  Examination: General:  Middle-aged female, in NAD. Neuro: calm , fc, nonfocal HEENT: JVD increased Cardiovascular: RRR, no M/R/G.  Lungs: CTA B Abdomen: Abdominal midline incision dry , no r/g Musculoskeletal: Surgical incision C/D/I. No gross deformities, no edema.  Dry gangrene seen on left 2nd toe. exts warm to touch.  Skin: Intact, warm, no rashes.    Data Reviewed: I have personally reviewed following labs and imaging studies  Micro Results Recent Results (from the past 240 hour(s))  MRSA PCR Screening     Status: None   Collection Time: 07/14/16 11:00 AM  Result Value Ref Range Status   MRSA by PCR NEGATIVE NEGATIVE Final    Comment:        The GeneXpert MRSA Assay (FDA approved for NASAL specimens only), is one component of a comprehensive MRSA colonization surveillance program. It is not intended to diagnose MRSA infection nor to guide or monitor treatment for MRSA infections.   C difficile quick scan w PCR reflex     Status: None   Collection Time: 07/17/16  1:25 AM  Result Value Ref Range Status   C Diff antigen NEGATIVE NEGATIVE Final   C Diff toxin NEGATIVE NEGATIVE Final   C Diff interpretation No C. difficile detected.  Final    Radiology Reports Nm Myocar Multi W/spect W/wall Motion / Ef  Result Date: 07/10/2016 CLINICAL DATA:  Chest pain and shortness of breath EXAM: MYOCARDIAL IMAGING WITH SPECT (REST AND PHARMACOLOGIC-STRESS) GATED LEFT VENTRICULAR WALL MOTION STUDY LEFT VENTRICULAR EJECTION FRACTION TECHNIQUE: Standard myocardial SPECT imaging was performed after resting intravenous injection of 10 mCi Tc-70m tetrofosmin. Subsequently, intravenous infusion of Lexiscan was performed under the supervision of the Cardiology staff. At peak effect of the drug, 30 mCi Tc-58m tetrofosmin was injected intravenously and standard myocardial SPECT imaging was performed. Quantitative gated imaging was also performed to evaluate left ventricular wall motion, and estimate left ventricular  ejection fraction. COMPARISON:  None available FINDINGS: Perfusion: Stress perfusion defect present of the mid inferior wall extending to the base and also laterally compatible with inducible ischemia with pharmacologic stress. Wall Motion: Normal left ventricular wall motion. No left ventricular dilation. Left Ventricular Ejection Fraction: 73 % End diastolic volume 41 ml End systolic volume 11 ml IMPRESSION: 1. Positive exam for inferior inducible ischemia extending laterally. 2. Normal left ventricular wall motion. 3. Left ventricular ejection fraction 73% 4. Non invasive risk stratification*: Intermediate to high *2012 Appropriate Use Criteria for Coronary Revascularization Focused Update: J Am Coll Cardiol. 3716;96(7):893-810. http://content.airportbarriers.com.aspx?articleid=1201161 These results will be called to the ordering clinician or representative by the Radiologist Assistant, and communication documented in the PACS or zVision Dashboard. Electronically Signed   By: Jerilynn Mages.  Shick M.D.   On: 07/10/2016 13:32   Dg Chest Port 1 View  Result Date: 07/15/2016 CLINICAL DATA:  Post aortobifemoral surgery. EXAM: PORTABLE CHEST 1 VIEW COMPARISON:  07/14/2016. FINDINGS: Stable support tubes and lines. Normal heart size. Calcified tortuous aorta. Clear lung fields. No pneumothorax. IMPRESSION: Stable chest. Electronically Signed   By: Staci Righter M.D.   On: 07/15/2016 08:35   Dg Chest Port 1 View  Result Date: 07/14/2016 CLINICAL DATA:  ET tube placement EXAM: PORTABLE CHEST 1 VIEW COMPARISON:  06/21/2008 FINDINGS: Endotracheal tube tip is approximately 2.1 cm superior to the carina. Esophageal tube tip overlies proximal stomach with side port near GE junction. Right IJ Swan-Ganz catheter with tip overlying the expected location of right pulmonary artery. Low lung volumes. Mild interstitial opacities. Streaky atelectasis left base. No effusion. Heart size normal. Mildly tortuous aorta with  atherosclerosis. No pneumothorax. IMPRESSION: 1. Support lines and tubes as above 2. Low lung volumes with mild left basilar atelectasis 3. Atherosclerosis of the aorta. 4. Esophageal tube tip overlies proximal stomach, side-port is at the GE junction. Electronically Signed   By: Donavan Foil M.D.   On: 07/14/2016 20:50   Dg Abd Portable 1v  Result Date: 07/14/2016 CLINICAL DATA:  Status post aortobifemoral bypass surgery EXAM: PORTABLE ABDOMEN - 1 VIEW COMPARISON:  06/22/2008 FINDINGS: Esophageal tube tip overlies the proximal stomach. Surgical clips overlie the bilateral pelvis. Surgical clips to the left of L3. Nonspecific gas pattern with overall decreased bowel gas. Calcified pelvic phleboliths. Vascular calcifications. No acute osseous abnormality. IMPRESSION: Nonspecific diffuse decreased bowel gas. Esophageal tube tip overlies the stomach, side-port was noted to overlie GE junction on chest radiograph. Electronically Signed   By: Donavan Foil M.D.   On: 07/14/2016 20:52   Dg Foot Complete Left  Result Date: 07/05/2016 CLINICAL DATA:  Necrotic wound on second toe  left foot. EXAM: LEFT FOOT - COMPLETE 3+ VIEW COMPARISON:  Left foot radiograph 05/28/2016 FINDINGS: The bones are osteopenic. There is no fracture or dislocation. Joint spaces are maintained. There is again seen a soft tissue wound of the distal second toe. No focal osteolysis is identified. IMPRESSION: No radiographic evidence of osteomyelitis. Soft tissue wound of the distal aspect of the left second toe. Electronically Signed   By: Ulyses Jarred M.D.   On: 07/05/2016 19:57     CBC  Recent Labs Lab 07/13/16 0218 07/14/16 0234  07/14/16 1721 07/14/16 1819 07/14/16 2038 07/15/16 0415 07/17/16 0221  WBC 9.2 8.7  --   --   --  19.9* 16.3* 16.4*  HGB 9.9* 9.6*  < > 9.2* 7.5* 10.4* 9.2* 7.0*  HCT 29.8* 28.8*  < > 27.0* 22.0* 30.3* 26.2* 20.3*  PLT 308 309  --   --   --  116* 98* 135*  MCV 89.5 90.3  --   --   --  82.1 82.9  82.2  MCH 29.7 30.1  --   --   --  28.2 29.1 28.3  MCHC 33.2 33.3  --   --   --  34.3 35.1 34.5  RDW 13.1 13.3  --   --   --  17.1* 18.7* 17.7*  LYMPHSABS  --   --   --   --   --   --   --  1.9  MONOABS  --   --   --   --   --   --   --  1.7*  EOSABS  --   --   --   --   --   --   --  0.0  BASOSABS  --   --   --   --   --   --   --  0.0  < > = values in this interval not displayed.  Chemistries   Recent Labs Lab 07/13/16 0218 07/14/16 0234  07/14/16 1819 07/14/16 2038 07/15/16 0415 07/15/16 0426 07/15/16 1239 07/17/16 0221  NA 140  --   < > 138 135 138  --  138 138  K 3.3*  --   < > 3.7 3.7 4.0  --  4.0 3.4*  CL 107  --   --   --  108 112*  --  112* 109  CO2 23  --   --   --  21* 20*  --  20* 21*  GLUCOSE 105*  --   --   --  192* 115*  --  137* 114*  BUN 17  --   --   --  14 16  --  17 14  CREATININE 0.85  --   --   --  0.71 0.90  --  1.19* 0.99  CALCIUM 9.0  --   --   --  7.6* 7.8*  --  7.9* 7.9*  MG  --  1.2*  --   --  0.8*  --  1.4*  --   --   AST  --   --   --   --   --  24  --   --  68*  ALT  --   --   --   --   --  18  --   --  43  ALKPHOS  --   --   --   --   --  31*  --   --  78  BILITOT  --   --   --   --   --  6.5*  --   --  2.8*  < > = values in this interval not displayed. ------------------------------------------------------------------------------------------------------------------ estimated creatinine clearance is 49.6 mL/min (by C-G formula based on SCr of 0.99 mg/dL). ------------------------------------------------------------------------------------------------------------------ No results for input(s): HGBA1C in the last 72 hours. ------------------------------------------------------------------------------------------------------------------ No results for input(s): CHOL, HDL, LDLCALC, TRIG, CHOLHDL, LDLDIRECT in the last 72 hours. ------------------------------------------------------------------------------------------------------------------ No  results for input(s): TSH, T4TOTAL, T3FREE, THYROIDAB in the last 72 hours.  Invalid input(s): FREET3 ------------------------------------------------------------------------------------------------------------------ No results for input(s): VITAMINB12, FOLATE, FERRITIN, TIBC, IRON, RETICCTPCT in the last 72 hours.  Coagulation profile  Recent Labs Lab 07/14/16 2038  INR 1.43    No results for input(s): DDIMER in the last 72 hours.  Cardiac Enzymes No results for input(s): CKMB, TROPONINI, MYOGLOBIN in the last 168 hours.  Invalid input(s): CK ------------------------------------------------------------------------------------------------------------------ Invalid input(s): POCBNP   CBG:  Recent Labs Lab 07/17/16 1149 07/17/16 1658 07/17/16 2012 07/18/16 0038 07/18/16 0437  GLUCAP 104* 105* 104* 119* 108*       Studies: No results found.    Lab Results  Component Value Date   HGBA1C 6.2 (H) 07/08/2016   Lab Results  Component Value Date   LDLCALC 32 06/08/2016   CREATININE 0.99 07/17/2016       Scheduled Meds: . atorvastatin  80 mg Per Tube q1800  . cloNIDine  0.2 mg Transdermal Weekly  . famotidine (PEPCID) IV  20 mg Intravenous Q12H  . heparin subcutaneous  5,000 Units Subcutaneous Q8H  . hydrochlorothiazide  25 mg Oral Daily  . HYDROmorphone   Intravenous Q4H  . insulin aspart  0-15 Units Subcutaneous Q4H  . mouth rinse  15 mL Mouth Rinse QID  . nicotine  7 mg Transdermal Daily   Continuous Infusions:    LOS: 13 days    Time spent: >30 MINS    [facility]  [facility] Pager [phone]. If 7PM-7AM, please contact night-coverage at [email or web address], password [email or web address] 07/18/2016, 7:51 AM  LOS: 13 days

## 2016-07-18 NOTE — Evaluation (Signed)
Physical Therapy Evaluation Patient Details Name: Wendy Townsend MRN: [id number] DOB: 1950/10/11 Today's Date: 07/18/2016   History of Present Illness  pt presents after redo of Aorto Bifemoral bypass grafts and L Fem Pop.  pt with hx of PVD, CVA, and HTN.    Clinical Impression  Pt generally deconditioned and requires A for all aspects of mobility.  At this time feel pt will need CIR level of therapies to maximize independence and overall decrease burden of care prior to returning to home with family.  Will continue to follow.      Follow Up Recommendations CIR    Equipment Recommendations  None recommended by PT    Recommendations for Other Services Rehab consult     Precautions / Restrictions Precautions Precautions: Fall Restrictions Weight Bearing Restrictions: No      Mobility  Bed Mobility Overal bed mobility: Needs Assistance Bed Mobility: Supine to Sit;Sit to Supine     Supine to sit: Max assist Sit to supine: Mod assist   General bed mobility comments: A with bringing LEs to EOB and bringing trunk up to sitting.  pt only needed A for LEs with return to bed.  pt indicates abdominal incision limits her ability.    Transfers Overall transfer level: Needs assistance Equipment used: Rolling walker (2 wheeled) Transfers: Sit to/from Stand Sit to Stand: Max assist         General transfer comment: cues for UE use and scooting closer to EOB for coming to standing.  pt needs heavy power up to standing and A to keep pt over midline as she tends to lean hard to the R.    Ambulation/Gait Ambulation/Gait assistance: Mod assist Ambulation Distance (Feet): 3 Feet (forward and back) Assistive device: Rolling walker (2 wheeled) Gait Pattern/deviations: Step-to pattern;Decreased step length - left;Decreased stance time - left;Trunk flexed     General Gait Details: pt with minimal weightbearing on L LE and seems generally weak in Bil UEs, which limit her use of RW.   cues for more upright posture.    Stairs            Wheelchair Mobility    Modified Rankin (Stroke Patients Only)       Balance Overall balance assessment: Needs assistance Sitting-balance support: Bilateral upper extremity supported;Single extremity supported;Feet supported Sitting balance-Leahy Scale: Poor     Standing balance support: Bilateral upper extremity supported;During functional activity Standing balance-Leahy Scale: Poor                               Pertinent Vitals/Pain Pain Assessment: 0-10 Pain Score: 6  Pain Location: Abdominal and Bil Groin incisions. Pain Descriptors / Indicators: Aching;Burning;Grimacing;Guarding Pain Intervention(s): Monitored during session;Premedicated before session;Repositioned    Home Living Family/patient expects to be discharged to:: Inpatient rehab                 Additional Comments: pt indicates her home is accessible since she had her CVA.      Prior Function Level of Independence: Needs assistance   Gait / Transfers Assistance Needed: pt uses [employer]  ADL's / Homemaking Assistance Needed: pt has A with all homemaking tasks and family are present when she showers, but she performs on her own.          Hand Dominance        Extremity/Trunk Assessment   Upper Extremity Assessment: Defer to OT evaluation  Lower Extremity Assessment: Generalized weakness;LLE deficits/detail   LLE Deficits / Details: Edema throughout extremity, sensation appears intact, knee and hip limited extension due to guarding from pain.  Strength overall limited post op by pain.    Cervical / Trunk Assessment: Kyphotic  Communication   Communication: No difficulties  Cognition Arousal/Alertness: Awake/alert Behavior During Therapy: WFL for tasks assessed/performed Overall Cognitive Status: Within Functional Limits for tasks assessed                      General Comments      Exercises      Assessment/Plan    PT Assessment Patient needs continued PT services  PT Problem List Decreased strength;Decreased range of motion;Decreased activity tolerance;Decreased balance;Decreased mobility;Decreased coordination;Decreased knowledge of use of DME;Pain          PT Treatment Interventions DME instruction;Gait training;Functional mobility training;Therapeutic activities;Therapeutic exercise;Balance training;Neuromuscular re-education;Patient/family education    PT Goals (Current goals can be found in the Care Plan section)  Acute Rehab PT Goals Patient Stated Goal: Be able to get home PT Goal Formulation: With patient Time For Goal Achievement: 08/01/16 Potential to Achieve Goals: Good    Frequency Min 3X/week   Barriers to discharge        Co-evaluation               End of Session Equipment Utilized During Treatment: Gait belt Activity Tolerance: Patient limited by fatigue;Patient limited by pain Patient left: in bed;with call bell/phone within reach Nurse Communication: Mobility status         Time: 2446-2863 PT Time Calculation (min) (ACUTE ONLY): 30 min   Charges:   PT Evaluation $PT Eval Moderate Complexity: 1 Procedure PT Treatments $Gait Training: 8-22 mins   PT G CodesCatarina Hartshorn, [place] 07/18/2016, 2:49 PM

## 2016-07-18 NOTE — Evaluation (Signed)
Clinical/Bedside Swallow Evaluation Patient Details  Name: Wendy Townsend MRN: [id number] Date of Birth: [birth year]  Today's Date: 07/18/2016 Time: SLP Start Time (ACUTE ONLY): 1034 SLP Stop Time (ACUTE ONLY): 1045 SLP Time Calculation (min) (ACUTE ONLY): 11 min  Past Medical History:  Past Medical History:  Diagnosis Date  . Hyperlipidemia   . Hypertension   . Peripheral vascular disease ([place])    vein surgery, left leg  . Stroke [facility]) 2002   ongoing mild loss of feeling in left side, had to learn to write right-handed  . Tobacco abuse    Past Surgical History:  Past Surgical History:  Procedure Laterality Date  . AORTA - BILATERAL FEMORAL ARTERY BYPASS GRAFT N/A 07/14/2016   Procedure: AORTOBIFEMORAL BYPASS GRAFT;  Surgeon: Waynetta Sandy, MD;  Location: [place];  Service: Vascular;  Laterality: N/A;  . CARDIAC CATHETERIZATION N/A 07/11/2016   Procedure: Left Heart Cath and Coronary Angiography;  Surgeon: Nelva Bush, MD;  Location: [place] [facility];  Service: Cardiovascular;  Laterality: N/A;  . FEMORAL-POPLITEAL BYPASS GRAFT Left 07/14/2016   Procedure: REDO BYPASS GRAFT FEMORAL-POPLITEAL ARTERY;  Surgeon: Waynetta Sandy, MD;  Location: [place];  Service: Vascular;  Laterality: Left;  . PERIPHERAL VASCULAR CATHETERIZATION N/A 07/08/2016   Procedure: Abdominal Aortogram w/ Bilateral Lower Extremity Runoff;  Surgeon: Elam Dutch, MD;  Location: [place] [facility];  Service: Cardiovascular;  Laterality: N/A;  . VEIN SURGERY Left    HPI:  65 y.o.femalewith PMH left CFA to below-knee popliteal artery bypass grafting on 05/30/08. She presented to [facility] ED 07/05/16 with necrotic left second toe. She was evaluated by vascular surgery the following day who felt that she likely had an occluded bypass graft. She had an aortogram 07/08/16 which confirmed occluded left common femoral artery. She was seen by cardiology, had left heart cath and was cleared for  surgery.On 10/26, she was taken to the OR for redo of the leftfemoropopliteal artery bypass graft. Following the procedure, she returned to the ICU on the ventilator. ETT 10/26-10-/26.    Assessment / Plan / Recommendation Clinical Impression  Pt presents with functional oropharyngeal swallow marked by adequate mastication despite absence of teeth, brisk swallow response, no overt s/s of aspiration despite large, successive thin liquid boluses. No s/s of dysphagia - recommend advancing diet per MD; no SLP f/u warranted    Aspiration Risk  No limitations    Diet Recommendation     Medication Administration: Whole meds with liquid    Other  Recommendations Oral Care Recommendations: Oral care BID   Follow up Recommendations None      Frequency and Duration            Prognosis        Swallow Study   General HPI: 65 y.o.femalewith PMH left CFA to below-knee popliteal artery bypass grafting on 05/30/08. She presented to [facility] ED 07/05/16 with necrotic left second toe. She was evaluated by vascular surgery the following day who felt that she likely had an occluded bypass graft. She had an aortogram 07/08/16 which confirmed occluded left common femoral artery. She was seen by cardiology, had left heart cath and was cleared for surgery.On 10/26, she was taken to the OR for redo of the leftfemoropopliteal artery bypass graft. Following the procedure, she returned to the ICU on the ventilator. ETT 10/26-10-/26.  Type of Study: Bedside Swallow Evaluation Previous Swallow Assessment: no Diet Prior to this Study: Thin liquids Temperature Spikes Noted: No Respiratory Status: Room air  History of Recent Intubation: Yes Length of Intubations (days): 1 days Date extubated: 07/14/16 Behavior/Cognition: Alert;Cooperative;Pleasant mood Oral Cavity Assessment: Within Functional Limits Oral Care Completed by SLP: No Oral Cavity - Dentition: Missing dentition Vision: Functional for  self-feeding Self-Feeding Abilities: Able to feed self Patient Positioning: Upright in bed Baseline Vocal Quality: Normal Volitional Cough: Strong Volitional Swallow: Able to elicit    Oral/Motor/Sensory Function Overall Oral Motor/Sensory Function: Other (comment) (CNXII deficit with deviation to left on extension prior CVA)   Ice Chips     Thin Liquid Thin Liquid: Within functional limits Presentation: Straw;Self Fed    Nectar Thick Nectar Thick Liquid: Not tested   Honey Thick Honey Thick Liquid: Not tested   Puree Puree: Within functional limits Presentation: Self Fed;Spoon   Solid   GO   Solid: Within functional limits Presentation: Self Fed        Lenvil Swaim Laurice 07/18/2016,10:54 AM

## 2016-07-18 NOTE — Progress Notes (Addendum)
AAA Progress Note    07/18/2016 7:21 AM 4 Days Post-Op  Subjective:  C/o stomach pain, but upon further talking to her, it has not changed since surgery.  Tm 100 now afebrile HR  659'D-357'S systolic 177% [id number]  Gtts:  none  Vitals:   07/18/16 0429 07/18/16 0433  BP: (!) 145/62   Pulse: 95   Resp: 18 18  Temp: 98.7 F (37.1 C)     Physical Exam: Cardiac:  regular Lungs:  Decreased BS at the base Abdomen:  Soft, NT/ND; +diarrhea Incisions:  All healing nicely Extremities:  Bilateral feet are warm and well perfused.  CBC    Component Value Date/Time   WBC 16.4 (H) 07/17/2016 0221   RBC 2.47 (L) 07/17/2016 0221   HGB 7.0 (L) 07/17/2016 0221   HCT 20.3 (L) 07/17/2016 0221   PLT 135 (L) 07/17/2016 0221   MCV 82.2 07/17/2016 0221   MCH 28.3 07/17/2016 0221   MCHC 34.5 07/17/2016 0221   RDW 17.7 (H) 07/17/2016 0221   LYMPHSABS 1.9 07/17/2016 0221   MONOABS 1.7 (H) 07/17/2016 0221   EOSABS 0.0 07/17/2016 0221   BASOSABS 0.0 07/17/2016 0221    BMET    Component Value Date/Time   NA 138 07/17/2016 0221   K 3.4 (L) 07/17/2016 0221   CL 109 07/17/2016 0221   CO2 21 (L) 07/17/2016 0221   GLUCOSE 114 (H) 07/17/2016 0221   BUN 14 07/17/2016 0221   CREATININE 0.99 07/17/2016 0221   CREATININE 1.52 (H) 06/08/2016 1008   CALCIUM 7.9 (L) 07/17/2016 0221   GFRNONAA 59 (L) 07/17/2016 0221   GFRNONAA 36 (L) 06/08/2016 1008   GFRAA >60 07/17/2016 0221   GFRAA 41 (L) 06/08/2016 1008    INR    Component Value Date/Time   INR 1.43 07/14/2016 2038     Intake/Output Summary (Last 24 hours) at 07/18/16 0721 Last data filed at 07/18/16 0438  Gross per 24 hour  Intake          1168.17 ml  Output             2165 ml  Net          -996.83 ml    ABI's 07/15/16:  RIGHT   LEFT    PRESSURE WAVEFORM  PRESSURE WAVEFORM  BRACHIAL 120 Triphasic BRACHIAL 135 Biphasic  DP 85 Monophasic DP 118 Monophasic  PT 94 Monophasic PT 91 Monophasic  GREAT TOE 63 NA GREAT  TOE 35 NA    RIGHT LEFT  ABI 0.7 0.87   ABI completed: Right ABI of 0.7 is suggestive of moderate arterial occlusive disease at rest. Left ABI of 0.87 is suggestive of moderate arterial occlusive disease at rest. Right TBI of 0.47 and left TBI of 0.26 are suggestive of abnormal flow at rest.  C diff 07/17/16 negative  Assessment/Plan:  65 y.o. female is s/p  1.  Aortobifemoral bypass with 14 x 22mm dacron in end to side fashion 2.  Left profunda endarterectomy 3.  Left femoral to below knee popliteal artery bypass with 15mm propaten via redo incisions 4 Days Post-Op   -pt doing well, but continues to have diarrhea-her C diff yesterday was negative. -her abdomen is soft and non tender to palpation and she does have bowel sounds-otherwise tolerating clear liquids. -elevated white count yesterday-low grade fever this am-await labs this morning -ABI's are much improved post op -discontinue foley catheter -pt is on PCA-consider changing to oral medications since she is tolerating diet -needs  PT evaluate and treat.  Pt needs to be getting out of bed -dry dressing to bilateral groins daily to wick moisture to help prevent wound infection.   -labs pending this am -acute surgical blood loss anemia-received 2 PRBC's yesterday.    Leontine Locket, PA-C [facility] [phone] 07/18/2016 7:21 AM   I have independently interviewed patient and agree with PA assessment and plan above. Advancing as expected. H/H with good response to prbc transfusion.  Brandon C. Donzetta Matters, MD [facility] [place] Office: [phone] Pager: [phone]

## 2016-07-18 NOTE — Progress Notes (Addendum)
Rehab Admissions Coordinator Note:  Patient was screened by Retta Diones for appropriateness for an Inpatient Acute Rehab Consult.  At this time, we are recommending Inpatient Rehab consult.  In addition will need an OT consult if inpatient rehab admission is desired.  Jodell Cipro M 07/18/2016, 3:03 PM  I can be reached at [phone].

## 2016-07-19 ENCOUNTER — Inpatient Hospital Stay (HOSPITAL_COMMUNITY): Payer: Commercial Managed Care - HMO

## 2016-07-19 DIAGNOSIS — I2510 Atherosclerotic heart disease of native coronary artery without angina pectoris: Secondary | ICD-10-CM

## 2016-07-19 DIAGNOSIS — D72820 Lymphocytosis (symptomatic): Secondary | ICD-10-CM

## 2016-07-19 DIAGNOSIS — I10 Essential (primary) hypertension: Secondary | ICD-10-CM

## 2016-07-19 DIAGNOSIS — I519 Heart disease, unspecified: Secondary | ICD-10-CM

## 2016-07-19 DIAGNOSIS — E8809 Other disorders of plasma-protein metabolism, not elsewhere classified: Secondary | ICD-10-CM

## 2016-07-19 DIAGNOSIS — G8918 Other acute postprocedural pain: Secondary | ICD-10-CM

## 2016-07-19 DIAGNOSIS — I5189 Other ill-defined heart diseases: Secondary | ICD-10-CM

## 2016-07-19 DIAGNOSIS — E46 Unspecified protein-calorie malnutrition: Secondary | ICD-10-CM

## 2016-07-19 DIAGNOSIS — N170 Acute kidney failure with tubular necrosis: Secondary | ICD-10-CM

## 2016-07-19 DIAGNOSIS — E43 Unspecified severe protein-calorie malnutrition: Secondary | ICD-10-CM

## 2016-07-19 DIAGNOSIS — N183 Chronic kidney disease, stage 3 (moderate): Secondary | ICD-10-CM

## 2016-07-19 DIAGNOSIS — R197 Diarrhea, unspecified: Secondary | ICD-10-CM

## 2016-07-19 DIAGNOSIS — Z8673 Personal history of transient ischemic attack (TIA), and cerebral infarction without residual deficits: Secondary | ICD-10-CM

## 2016-07-19 DIAGNOSIS — D62 Acute posthemorrhagic anemia: Secondary | ICD-10-CM

## 2016-07-19 LAB — GLUCOSE, CAPILLARY
GLUCOSE-CAPILLARY: 99 mg/dL (ref 65–99)
GLUCOSE-CAPILLARY: 104 mg/dL — AB (ref 65–99)
GLUCOSE-CAPILLARY: 96 mg/dL (ref 65–99)
Glucose-Capillary: 116 mg/dL — ABNORMAL HIGH (ref 65–99)
Glucose-Capillary: 98 mg/dL (ref 65–99)

## 2016-07-19 LAB — COMPREHENSIVE METABOLIC PANEL
ALT: 40 U/L (ref 14–54)
ANION GAP: 10 (ref 5–15)
AST: 49 U/L — ABNORMAL HIGH (ref 15–41)
Albumin: 2.5 g/dL — ABNORMAL LOW (ref 3.5–5.0)
Alkaline Phosphatase: 94 U/L (ref 38–126)
BUN: 6 mg/dL (ref 6–20)
CHLORIDE: 103 mmol/L (ref 101–111)
CO2: 25 mmol/L (ref 22–32)
Calcium: 8.3 mg/dL — ABNORMAL LOW (ref 8.9–10.3)
Creatinine, Ser: 0.95 mg/dL (ref 0.44–1.00)
GFR calc non Af Amer: >60 mL/min (ref >60)
Glucose, Bld: 96 mg/dL (ref 65–99)
POTASSIUM: 3.4 mmol/L — AB (ref 3.5–5.1)
SODIUM: 138 mmol/L (ref 135–145)
Total Bilirubin: 3.7 mg/dL — ABNORMAL HIGH (ref 0.3–1.2)
Total Protein: 5.1 g/dL — ABNORMAL LOW (ref 6.5–8.1)

## 2016-07-19 LAB — MAGNESIUM: Magnesium: 1.5 mg/dL — ABNORMAL LOW (ref 1.7–2.4)

## 2016-07-19 MED ORDER — CLONIDINE HCL 0.2 MG/24HR TD PTWK: 0.2000 mg | MEDICATED_PATCH | TRANSDERMAL | 12 refills | Status: DC

## 2016-07-19 MED ORDER — ATORVASTATIN CALCIUM 80 MG PO TABS: 80.0000 mg | ORAL_TABLET | Freq: Every day | ORAL | 1 refills | Status: DC

## 2016-07-19 MED ORDER — HYDROCHLOROTHIAZIDE 25 MG PO TABS: 25.0000 mg | ORAL_TABLET | Freq: Every day | ORAL | 1 refills | Status: DC

## 2016-07-19 MED ORDER — NICOTINE 7 MG/24HR TD PT24: 7.0000 mg | MEDICATED_PATCH | Freq: Every day | TRANSDERMAL | 0 refills | Status: DC

## 2016-07-19 MED ORDER — HEPARIN SODIUM (PORCINE) 5000 UNIT/ML IJ SOLN: 5000.0000 [IU] | Freq: Three times a day (TID) | INTRAMUSCULAR | 0 refills | Status: DC

## 2016-07-19 MED ORDER — CARVEDILOL 3.125 MG PO TABS: 3.1250 mg | ORAL_TABLET | Freq: Two times a day (BID) | ORAL | 1 refills | Status: DC

## 2016-07-19 MED ORDER — TECHNETIUM TC 99M MEBROFENIN IV KIT
5.0000 | PACK | Freq: Once | INTRAVENOUS | Status: AC | PRN
  Administered 2016-07-19: 5 via INTRAVENOUS

## 2016-07-19 NOTE — Care Management Important Message (Signed)
Important Message  Patient Details  Name: Shuntay Everetts MRN: [id number] Date of Birth: 1951/06/05   Medicare Important Message Given:  Yes    Boen Sterbenz Abena 07/19/2016, 11:38 AM

## 2016-07-19 NOTE — Progress Notes (Addendum)
[facility] [place]  Subjective  - Doing much better today.  Passed gas, tolerated liquids, ambulated.  Reports abdominal pain has improved.   Objective (!) 143/76 88 98.4 F (36.9 C) (Oral) 18 100%  Intake/Output Summary (Last 24 hours) at 07/19/16 0747 Last data filed at 07/18/16 2251  Gross per 24 hour  Intake              290 ml  Output              450 ml  Net             -160 ml    Abdomin non distended, positive BS, soft.  Incision clean and dry Right DP palpable left foot with edema, dry gangrene toes no change.  Left foot warm Groins soft without hematoma     Assessment/Plan:  65 y.o. female is s/p  1. Aortobifemoral bypass with 14 x 76mm dacron in end to side fashion 2. Left profunda endarterectomy 3. Left femoral to below knee popliteal artery bypass with 102mm propaten via redo incisions 4 Days Post-Op   Pending Hida scan Swallow eval complete cleared for diet advance as tolerates. Likely advance diet pending scan results PT for mobility HGB stable after transfusion Afebrile WBC decresed  COLLINS, EMMA MAUREEN 07/19/2016 7:47 AM --  Laboratory Lab Results:  Recent Labs  07/17/16 0221 07/18/16 0840  WBC 16.4* 13.4*  HGB 7.0* 10.1*  HCT 20.3* 30.7*  PLT 135* 159   BMET  Recent Labs  07/17/16 0221 07/18/16 0840  NA 138 140  K 3.4* 3.7  CL 109 110  CO2 21* 22  GLUCOSE 114* 115*  BUN 14 9  CREATININE 0.99 0.91  CALCIUM 7.9* 8.2*    COAG Lab Results  Component Value Date   INR 1.43 07/14/2016   INR 1.15 07/07/2016   INR 1.07 07/05/2016   No results found for: PTT   I have independently interviewed patient and agree with PA assessment and plan above. Progressing well from surgical standpoint  Geramy Lamorte C. Donzetta Matters, MD [facility] [place] Office: [phone] Pager: [phone]

## 2016-07-19 NOTE — Consult Note (Signed)
Physical Medicine and Rehabilitation Consult Reason for Consult: Debilitation after redo aortobifemoral bypass graft and left femoral popliteal Referring Physician: [facility]   HPI: Wendy Townsend is a 65 y.o. right handed female with history of CVA maintained on aspirin, hypertension, tobacco abuse, PVD with necrotic toe with multiple revascularization procedures. Per chart review patient lives with family and assistance as needed. Used a cane prior to admission. One level home. Presented 07/05/2016 with nonhealing wound of her second left toe after she splashed grease on it in early September. She had been initially treated with doxycycline later switched to Levaquin after wound culture showed gram-negative rods and moderate GPC in pairs.. Aortogram imaging revealed thrombosed left femoral-popliteal bypass due to left iliac arterial occlusion and right external iliac artery occlusion. Underwent preoperative clearance by cardiology services. Echocardiogram with ejection fraction of 75% grade 1 diastolic dysfunction as well as preoperative cardiac catheterization showing mild to moderate nonobstructive CAD. Normal left ventricular filling pressure. No high-grade stenosis to prevent proceeding with urgent vascular surgery. Underwent aortobifemoral bypass as well as redo left femoral exposure left femoral-popliteal bypass 07/14/2016 per Dr. Bridgett Larsson. HIDA scan normal. Hospital course pain management. Subcutaneous heparin for DVT prophylaxis. Acute surgical blood loss anemia received 2 units packed red blood cells. Intermittent bouts of diarrhea some abdominal pain. Clostridium difficile negative. Total bilirubin 4.2 and ultrasound of the abdomen is pending. Physical and occupational therapy evaluations completed 07/18/2016 with recommendations of physical medicine rehabilitation consult.   Review of Systems  Constitutional: Negative for chills and fever.  HENT: Negative for hearing loss.   Eyes:  Negative for blurred vision and double vision.  Respiratory: Positive for cough and shortness of breath.   Cardiovascular: Positive for leg swelling. Negative for chest pain and palpitations.  Gastrointestinal: Positive for constipation. Negative for nausea and vomiting.  Genitourinary: Negative for dysuria and hematuria.  Musculoskeletal: Positive for myalgias.  Skin: Negative for rash.  Neurological: Positive for weakness. Negative for seizures and headaches.  All other systems reviewed and are negative.  Past Medical History:  Diagnosis Date  . Hyperlipidemia   . Hypertension   . Peripheral vascular disease ([place])    vein surgery, left leg  . Stroke [facility]) 2002   ongoing mild loss of feeling in left side, had to learn to write right-handed  . Tobacco abuse    Past Surgical History:  Procedure Laterality Date  . AORTA - BILATERAL FEMORAL ARTERY BYPASS GRAFT N/A 07/14/2016   Procedure: AORTOBIFEMORAL BYPASS GRAFT;  Surgeon: Waynetta Sandy, MD;  Location: [place];  Service: Vascular;  Laterality: N/A;  . CARDIAC CATHETERIZATION N/A 07/11/2016   Procedure: Left Heart Cath and Coronary Angiography;  Surgeon: Nelva Bush, MD;  Location: [place] [facility];  Service: Cardiovascular;  Laterality: N/A;  . FEMORAL-POPLITEAL BYPASS GRAFT Left 07/14/2016   Procedure: REDO BYPASS GRAFT FEMORAL-POPLITEAL ARTERY;  Surgeon: Waynetta Sandy, MD;  Location: [place];  Service: Vascular;  Laterality: Left;  . PERIPHERAL VASCULAR CATHETERIZATION N/A 07/08/2016   Procedure: Abdominal Aortogram w/ Bilateral Lower Extremity Runoff;  Surgeon: Elam Dutch, MD;  Location: [place] [facility];  Service: Cardiovascular;  Laterality: N/A;  . VEIN SURGERY Left    Family History  Problem Relation Age of Onset  . Hypertension Mother 94   Social History:  reports that she has been smoking Cigarettes.  She has smoked for the past 25.00 years. She has never used smokeless tobacco. She reports  that she drinks alcohol. She reports  that she uses drugs, including Marijuana. Allergies:  Allergies  Allergen Reactions  . Penicillins Swelling    Has patient had a PCN reaction causing immediate rash, facial/tongue/throat swelling, SOB or lightheadedness with hypotension: YES Has patient had a PCN reaction causing severe rash involving mucus membranes or skin necrosis: NO Has patient had a PCN reaction that required hospitalization NO Has patient had a PCN reaction occurring within the last 10 years: NO If all of the above answers are "NO", then may proceed with Cephalosporin use.   Medications Prior to Admission  Medication Sig Dispense Refill  . aspirin EC 81 MG tablet TAKE 1 TABLET (325 MG TOTAL) BY MOUTH DAILY. 30 tablet 11  . cloNIDine (CATAPRES) 0.2 MG tablet Take 1 tablet (0.2 mg total) by mouth 2 (two) times daily. 60 tablet 11  . hydrochlorothiazide (HYDRODIURIL) 25 MG tablet Take 0.5 tablets (12.5 mg total) by mouth daily. (Patient taking differently: Take 12.5 mg by mouth every other day. ) 15 tablet 11  . lisinopril (PRINIVIL,ZESTRIL) 40 MG tablet Take 1 tablet (40 mg total) by mouth daily. 30 tablet 11  . potassium chloride (MICRO-K) 10 MEQ CR capsule TAKE 1 TABLET (10 MEQ TOTAL) BY MOUTH DAILY. 31 capsule 11  . saccharomyces boulardii (FLORASTOR) 250 MG capsule Take 1 capsule (250 mg total) by mouth 2 (two) times daily. 60 capsule 0  . simvastatin (ZOCOR) 40 MG tablet TAKE 1 TABLET (40 MG TOTAL) BY MOUTH AT BEDTIME. 30 tablet 11    Home: Home Living Family/patient expects to be discharged to:: Inpatient rehab Living Arrangements: Other relatives Additional Comments: pt indicates her home is accessible since she had her CVA.    Functional History: Prior Function Level of Independence: Needs assistance Gait / Transfers Assistance Needed: pt uses [employer] ADL's / Homemaking Assistance Needed: pt has A with all homemaking tasks and family are present when she showers, but  she performs on her own.   Functional Status:  Mobility: Bed Mobility Overal bed mobility: Needs Assistance Bed Mobility: Supine to Sit, Sit to Supine Supine to sit: Max assist Sit to supine: Mod assist General bed mobility comments: A with bringing LEs to EOB and bringing trunk up to sitting.  pt only needed A for LEs with return to bed.  pt indicates abdominal incision limits her ability.   Transfers Overall transfer level: Needs assistance Equipment used: Rolling walker (2 wheeled) Transfers: Sit to/from Stand Sit to Stand: Max assist General transfer comment: cues for UE use and scooting closer to EOB for coming to standing.  pt needs heavy power up to standing and A to keep pt over midline as she tends to lean hard to the R.   Ambulation/Gait Ambulation/Gait assistance: Mod assist Ambulation Distance (Feet): 3 Feet (forward and back) Assistive device: Rolling walker (2 wheeled) Gait Pattern/deviations: Step-to pattern, Decreased step length - left, Decreased stance time - left, Trunk flexed General Gait Details: pt with minimal weightbearing on L LE and seems generally weak in Bil UEs, which limit her use of RW.  cues for more upright posture.      ADL:    Cognition: Cognition Overall Cognitive Status: Within Functional Limits for tasks assessed Orientation Level: Oriented X4 Cognition Arousal/Alertness: Awake/alert Behavior During Therapy: WFL for tasks assessed/performed Overall Cognitive Status: Within Functional Limits for tasks assessed  Blood pressure (!) (P) 143/76, pulse (P) 88, temperature (P) 98.4 F (36.9 C), temperature source (P) Oral, resp. rate (P) 18, height 5\' 4"  (1.626 m), weight  65 kg (143 lb 4.8 oz), SpO2 (P) 100 %. Physical Exam  Vitals reviewed. Constitutional: She is oriented to person, place, and time. She appears well-developed and well-nourished.  65 year old right-handed female.  HENT:  Head: Normocephalic and atraumatic.  Poor dentition    Eyes: EOM are normal. Scleral icterus (Mild) is present.  Neck: Normal range of motion. Neck supple. No thyromegaly present.  Cardiovascular: Normal rate and regular rhythm.   PVCs  Respiratory: Effort normal and breath sounds normal. No respiratory distress.  GI: Soft. Bowel sounds are normal. She exhibits no distension.  Musculoskeletal: She exhibits edema and tenderness.  Neurological: She is alert and oriented to person, place, and time.  Follow simple commands Motor: B/l UE: 4/5 proximal to distal RLE: Hip flexion, knee extension 3/5, ankle dorsi/plantar flexion 4/5 LLE: Hip flexion, knee extension 2/5, ankle dorsi/plantar flexion 3/5  Skin:  Revascularization sites clean and dry LLE scaly with vascular changes, dry gangrene 2nd digit  Psychiatric: She has a normal mood and affect. Her behavior is normal.    Results for orders placed or performed during the hospital encounter of 07/05/16 (from the past 24 hour(s))  Glucose, capillary     Status: Abnormal   Collection Time: 07/18/16  8:32 AM  Result Value Ref Range   Glucose-Capillary 122 (H) 65 - 99 mg/dL   Comment 1 Notify RN    Comment 2 Document in Chart   Magnesium     Status: Abnormal   Collection Time: 07/18/16  8:40 AM  Result Value Ref Range   Magnesium 1.4 (L) 1.7 - 2.4 mg/dL  Phosphorus     Status: Abnormal   Collection Time: 07/18/16  8:40 AM  Result Value Ref Range   Phosphorus 1.7 (L) 2.5 - 4.6 mg/dL  CBC     Status: Abnormal   Collection Time: 07/18/16  8:40 AM  Result Value Ref Range   WBC 13.4 (H) 4.0 - 10.5 K/uL   RBC 3.56 (L) 3.87 - 5.11 MIL/uL   Hemoglobin 10.1 (L) 12.0 - 15.0 g/dL   HCT 30.7 (L) 36.0 - 46.0 %   MCV 86.2 78.0 - 100.0 fL   MCH 28.4 26.0 - 34.0 pg   MCHC 32.9 30.0 - 36.0 g/dL   RDW 16.8 (H) 11.5 - 15.5 %   Platelets 159 150 - 400 K/uL  Comprehensive metabolic panel     Status: Abnormal   Collection Time: 07/18/16  8:40 AM  Result Value Ref Range   Sodium 140 135 - 145 mmol/L    Potassium 3.7 3.5 - 5.1 mmol/L   Chloride 110 101 - 111 mmol/L   CO2 22 22 - 32 mmol/L   Glucose, Bld 115 (H) 65 - 99 mg/dL   BUN 9 6 - 20 mg/dL   Creatinine, Ser 0.91 0.44 - 1.00 mg/dL   Calcium 8.2 (L) 8.9 - 10.3 mg/dL   Total Protein 5.2 (L) 6.5 - 8.1 g/dL   Albumin 2.4 (L) 3.5 - 5.0 g/dL   AST 72 (H) 15 - 41 U/L   ALT 49 14 - 54 U/L   Alkaline Phosphatase 93 38 - 126 U/L   Total Bilirubin 4.2 (H) 0.3 - 1.2 mg/dL   GFR calc non Af Amer >60 >60 mL/min   GFR calc Af Amer >60 >60 mL/min   Anion gap 8 5 - 15  Lipase, blood     Status: None   Collection Time: 07/18/16  8:40 AM  Result Value Ref Range  Lipase 31 11 - 51 U/L  Glucose, capillary     Status: Abnormal   Collection Time: 07/18/16 11:22 AM  Result Value Ref Range   Glucose-Capillary 102 (H) 65 - 99 mg/dL   Comment 1 Notify RN    Comment 2 Document in Chart   Glucose, capillary     Status: Abnormal   Collection Time: 07/18/16  4:48 PM  Result Value Ref Range   Glucose-Capillary 103 (H) 65 - 99 mg/dL  Glucose, capillary     Status: Abnormal   Collection Time: 07/18/16  8:34 PM  Result Value Ref Range   Glucose-Capillary 106 (H) 65 - 99 mg/dL   No results found.  Assessment/Plan: Diagnosis: Debilitation after redo aortobifemoral bypass graft and left femoral popliteal Labs and images independently reviewed.  Records reviewed and summated above.  1. Does the need for close, 24 hr/day medical supervision in concert with the patient's rehab needs make it unreasonable for this patient to be served in a less intensive setting? Yes  2. Co-Morbidities requiring supervision/potential complications: history of CVA (cont meds), HTN (monitor and provide prns in accordance with increased physical exertion and pain), tobacco abuse (counsel), PVD with multiple revascularization procedures (cont meds), diastolic dysfunction (monitor for fluid overload), nonobstructive CAD (cont meds), post-op pain management (Biofeedback training  with therapies to help reduce reliance on opiate pain medications, monitor pain control during therapies, and sedation at rest and titrate to maximum efficacy to ensure participation and gains in therapies), Acute blood loss anemia (transfuse further if necessary to ensure appropriate perfusion for increased activity tolerance), diarrhea (monitor fluid status), hypophosphatemia (supplement), hypomagnesemia (supplement), hypoalbuminemia (maximize nutrition for overall health and wound healing), leukocytosis (cont to monitor for signs and symptoms of infection, further workup if indicated) 3. Due to bladder management, bowel management, safety, skin/wound care, disease management, pain management and patient education, does the patient require 24 hr/day rehab nursing? Yes 4. Does the patient require coordinated care of a physician, rehab nurse, PT (1-2 hrs/day, 5 days/week) and OT (1-2 hrs/day, 5 days/week) to address physical and functional deficits in the context of the above medical diagnosis(es)? Yes Addressing deficits in the following areas: balance, endurance, locomotion, strength, transferring, bathing, dressing, toileting and psychosocial support 5. Can the patient actively participate in an intensive therapy program of at least 3 hrs of therapy per day at least 5 days per week? Yes 6. The potential for patient to make measurable gains while on inpatient rehab is excellent 7. Anticipated functional outcomes upon discharge from inpatient rehab are supervision and min assist  with PT, supervision and min assist with OT, n/a with SLP. 8. Estimated rehab length of stay to reach the above functional goals is: 12-16 days. 9. Does the patient have adequate social supports and living environment to accommodate these discharge functional goals? Yes 10. Anticipated D/C setting: Home 11. Anticipated post D/C treatments: HH therapy and Home excercise program 12. Overall Rehab/Functional Prognosis:  good  RECOMMENDATIONS: This patient's condition is appropriate for continued rehabilitative care in the following setting: CIR when medically appropriate  ultrasound of the abdomen is pending. Patient has agreed to participate in recommended program. Yes Note that insurance prior authorization may be required for reimbursement for recommended care.  Comment: Rehab Admissions Coordinator to follow up.  Delice Lesch, MD, [facility] 07/19/2016

## 2016-07-19 NOTE — Progress Notes (Signed)
Inpatient Rehabilitation  I met with the patient to discuss the recommendation for IP rehab.  Pt. states she has been on IP Rehab in the past and would like to return for her rehab if I can get insurance authorization.  I will initiate the insurance approval process.  I discussed with Marvetta Gibbons, RNCM.  Please call if questions.  [place] [facility] Admissions Coordinator Cell [phone] Office [phone]

## 2016-07-19 NOTE — Discharge Summary (Signed)
Physician Discharge Summary  Wendy Townsend MRN: [id number] DOB/AGE: 1951-06-02 65 y.o.  PCP: Gildardo Cranker, DO   Admit date: 07/05/2016 Discharge date: 07/19/2016  Discharge Diagnoses:    Principal Problem:   Dry gangrene ([place]) Active Problems:   Hypertension   Hyperlipidemia   PAD (peripheral artery disease) ([facility])   Tobacco use disorder   Ischemic pain of left foot   Hyperglycemia   Acute kidney injury superimposed on chronic kidney disease ([place])   Protein-calorie malnutrition, severe   Abnormal stress test   Dyspnea   Acute renal failure superimposed on stage 3 chronic kidney disease ([place])   Essential hypertension   Impaired glucose tolerance   Ischemic pain of foot, left   Status post aortobifemoral bypass surgery   Acute respiratory failure with hypoxia ([place])   Encounter for rehabilitation    Follow-up recommendations Follow-up with PCP in 3-5 days , including all  additional recommended appointments as below Follow-up CBC, CMP in 3-5 days Patient is being discharged to inpatient rehabilitation Follow up with vascular surgery is recommended Pain management per vascular surgery given that she is postop      Current Discharge Medication List    START taking these medications   Details  atorvastatin (LIPITOR) 80 MG tablet Place 1 tablet (80 mg total) into feeding tube daily at 6 PM. Qty: 30 tablet, Refills: 1    carvedilol (COREG) 3.125 MG tablet Take 1 tablet (3.125 mg total) by mouth 2 (two) times daily with a meal. Qty: 60 tablet, Refills: 1    cloNIDine (CATAPRES - DOSED IN MG/24 HR) 0.2 mg/24hr patch Place 1 patch (0.2 mg total) onto the skin once a week. Qty: 4 patch, Refills: 12    heparin 5000 UNIT/ML injection Inject 1 mL (5,000 Units total) into the skin every 8 (eight) hours. Qty: 1 mL, Refills: 0    nicotine (NICODERM CQ - DOSED IN MG/24 HR) 7 mg/24hr patch Place 1 patch (7 mg total) onto the skin daily. Qty: 28 patch, Refills: 0       CONTINUE these medications which have CHANGED   Details  hydrochlorothiazide (HYDRODIURIL) 25 MG tablet Take 1 tablet (25 mg total) by mouth daily. Qty: 30 tablet, Refills: 1      CONTINUE these medications which have NOT CHANGED   Details  aspirin EC 81 MG tablet TAKE 1 TABLET (325 MG TOTAL) BY MOUTH DAILY. Qty: 30 tablet, Refills: 11    potassium chloride (MICRO-K) 10 MEQ CR capsule TAKE 1 TABLET (10 MEQ TOTAL) BY MOUTH DAILY. Qty: 31 capsule, Refills: 11    saccharomyces boulardii (FLORASTOR) 250 MG capsule Take 1 capsule (250 mg total) by mouth 2 (two) times daily. Qty: 60 capsule, Refills: 0   Associated Diagnoses: Cellulitis of toe of left foot      STOP taking these medications     cloNIDine (CATAPRES) 0.2 MG tablet      lisinopril (PRINIVIL,ZESTRIL) 40 MG tablet      simvastatin (ZOCOR) 40 MG tablet          Discharge Condition: Stable   Discharge Instructions Get Medicines reviewed and adjusted: Please take all your medications with you for your next visit with your Primary MD  Please request your Primary MD to go over all hospital tests and procedure/radiological results at the follow up, please ask your Primary MD to get all Hospital records sent to his/her office.  If you experience worsening of your admission symptoms, develop shortness of breath, life threatening emergency, suicidal  or homicidal thoughts you must seek medical attention immediately by calling 911 or calling your MD immediately if symptoms less severe.  You must read complete instructions/literature along with all the possible adverse reactions/side effects for all the Medicines you take and that have been prescribed to you. Take any new Medicines after you have completely understood and accpet all the possible adverse reactions/side effects.   Do not drive when taking Pain medications.   Do not take more than prescribed Pain, Sleep and Anxiety Medications  Special Instructions: If you  have smoked or chewed Tobacco in the last 2 yrs please stop smoking, stop any regular Alcohol and or any Recreational drug use.  Wear Seat belts while driving.  Please note  You were cared for by a hospitalist during your hospital stay. Once you are discharged, your primary care physician will handle any further medical issues. Please note that NO REFILLS for any discharge medications will be authorized once you are discharged, as it is imperative that you return to your primary care physician (or establish a relationship with a primary care physician if you do not have one) for your aftercare needs so that they can reassess your need for medications and monitor your lab values.     Allergies  Allergen Reactions  . Penicillins Swelling    Has patient had a PCN reaction causing immediate rash, facial/tongue/throat swelling, SOB or lightheadedness with hypotension: YES Has patient had a PCN reaction causing severe rash involving mucus membranes or skin necrosis: NO Has patient had a PCN reaction that required hospitalization NO Has patient had a PCN reaction occurring within the last 10 years: NO If all of the above answers are "NO", then may proceed with Cephalosporin use.      Disposition: CIR   Consults:  Vascular surgery Cardiology     Significant Diagnostic Studies:  Nm Myocar Multi W/spect W/wall Motion / Ef  Result Date: 07/10/2016 CLINICAL DATA:  Chest pain and shortness of breath EXAM: MYOCARDIAL IMAGING WITH SPECT (REST AND PHARMACOLOGIC-STRESS) GATED LEFT VENTRICULAR WALL MOTION STUDY LEFT VENTRICULAR EJECTION FRACTION TECHNIQUE: Standard myocardial SPECT imaging was performed after resting intravenous injection of 10 mCi Tc-6mtetrofosmin. Subsequently, intravenous infusion of Lexiscan was performed under the supervision of the Cardiology staff. At peak effect of the drug, 30 mCi Tc-971metrofosmin was injected intravenously and standard myocardial SPECT imaging was  performed. Quantitative gated imaging was also performed to evaluate left ventricular wall motion, and estimate left ventricular ejection fraction. COMPARISON:  None available FINDINGS: Perfusion: Stress perfusion defect present of the mid inferior wall extending to the base and also laterally compatible with inducible ischemia with pharmacologic stress. Wall Motion: Normal left ventricular wall motion. No left ventricular dilation. Left Ventricular Ejection Fraction: 73 % End diastolic volume 41 ml End systolic volume 11 ml IMPRESSION: 1. Positive exam for inferior inducible ischemia extending laterally. 2. Normal left ventricular wall motion. 3. Left ventricular ejection fraction 73% 4. Non invasive risk stratification*: Intermediate to high *2012 Appropriate Use Criteria for Coronary Revascularization Focused Update: J Am Coll Cardiol. 202694;85(4):627-035http://content.onairportbarriers.comspx?articleid=1201161 These results will be called to the ordering clinician or representative by the Radiologist Assistant, and communication documented in the PACS or zVision Dashboard. Electronically Signed   By: M.Jerilynn Mages Shick M.D.   On: 07/10/2016 13:32   Dg Chest Port 1 View  Result Date: 07/15/2016 CLINICAL DATA:  Post aortobifemoral surgery. EXAM: PORTABLE CHEST 1 VIEW COMPARISON:  07/14/2016. FINDINGS: Stable support tubes and lines. Normal heart size.  Calcified tortuous aorta. Clear lung fields. No pneumothorax. IMPRESSION: Stable chest. Electronically Signed   By: Staci Righter M.D.   On: 07/15/2016 08:35   Dg Chest Port 1 View  Result Date: 07/14/2016 CLINICAL DATA:  ET tube placement EXAM: PORTABLE CHEST 1 VIEW COMPARISON:  06/21/2008 FINDINGS: Endotracheal tube tip is approximately 2.1 cm superior to the carina. Esophageal tube tip overlies proximal stomach with side port near GE junction. Right IJ Swan-Ganz catheter with tip overlying the expected location of right pulmonary artery. Low lung volumes.  Mild interstitial opacities. Streaky atelectasis left base. No effusion. Heart size normal. Mildly tortuous aorta with atherosclerosis. No pneumothorax. IMPRESSION: 1. Support lines and tubes as above 2. Low lung volumes with mild left basilar atelectasis 3. Atherosclerosis of the aorta. 4. Esophageal tube tip overlies proximal stomach, side-port is at the GE junction. Electronically Signed   By: Donavan Foil M.D.   On: 07/14/2016 20:50   Dg Abd Portable 1v  Result Date: 07/14/2016 CLINICAL DATA:  Status post aortobifemoral bypass surgery EXAM: PORTABLE ABDOMEN - 1 VIEW COMPARISON:  06/22/2008 FINDINGS: Esophageal tube tip overlies the proximal stomach. Surgical clips overlie the bilateral pelvis. Surgical clips to the left of L3. Nonspecific gas pattern with overall decreased bowel gas. Calcified pelvic phleboliths. Vascular calcifications. No acute osseous abnormality. IMPRESSION: Nonspecific diffuse decreased bowel gas. Esophageal tube tip overlies the stomach, side-port was noted to overlie GE junction on chest radiograph. Electronically Signed   By: Donavan Foil M.D.   On: 07/14/2016 20:52   Dg Foot Complete Left  Result Date: 07/05/2016 CLINICAL DATA:  Necrotic wound on second toe left foot. EXAM: LEFT FOOT - COMPLETE 3+ VIEW COMPARISON:  Left foot radiograph 05/28/2016 FINDINGS: The bones are osteopenic. There is no fracture or dislocation. Joint spaces are maintained. There is again seen a soft tissue wound of the distal second toe. No focal osteolysis is identified. IMPRESSION: No radiographic evidence of osteomyelitis. Soft tissue wound of the distal aspect of the left second toe. Electronically Signed   By: Ulyses Jarred M.D.   On: 07/05/2016 19:57    2-D echo LV EF: 65% -   70%  ------------------------------------------------------------------- Indications:      Dyspnea 786.09.  ------------------------------------------------------------------- History:   PMH:  Pre-op eval.  PAD.  ------------------------------------------------------------------- Study Conclusions  - Left ventricle: The cavity size was normal. Wall thickness was   increased in a pattern of mild LVH. Systolic function was   vigorous. The estimated ejection fraction was in the range of 65%   to 70%. Wall motion was normal; there were no regional wall   motion abnormalities. Doppler parameters are consistent with   abnormal left ventricular relaxation (grade 1 diastolic   dysfunction).    Cardiac catheterization 07/11/16 Conclusion   Conclusions: 1. Mild to moderate non-obstructive coronary artery disease, as detailed below. 2. Normal left ventricular filling pressure. 3. Systemic hypertension  Recommendations: 1. No high-grade stenosis to prevent proceeding with urgent vascular surgery. 2. Continue secondary prevention of atherosclerotic cardiovascular disease. 3. Start standing carvedilol 6.25 mg twice a day, with uptitration as heart rate and blood pressure allow. 4. Switch simvastatin to atorvastatin 80 mg daily.     Filed Weights   07/05/16 2323 07/14/16 2100 07/19/16 0625  Weight: 58.2 kg (128 lb 6.4 oz) 65 kg (143 lb 4.8 oz) 80.1 kg (176 lb 8 oz)     Microbiology: Recent Results (from the past 240 hour(s))  MRSA PCR Screening     Status: None  Collection Time: 07/14/16 11:00 AM  Result Value Ref Range Status   MRSA by PCR NEGATIVE NEGATIVE Final    Comment:        The GeneXpert MRSA Assay (FDA approved for NASAL specimens only), is one component of a comprehensive MRSA colonization surveillance program. It is not intended to diagnose MRSA infection nor to guide or monitor treatment for MRSA infections.   C difficile quick scan w PCR reflex     Status: None   Collection Time: 07/17/16  1:25 AM  Result Value Ref Range Status   C Diff antigen NEGATIVE NEGATIVE Final   C Diff toxin NEGATIVE NEGATIVE Final   C Diff interpretation No C. difficile detected.   Final       Blood Culture    Component Value Date/Time   SDES URINE, CLEAN CATCH 02/20/2007 2243   [place]  IMMUNE:NORM  UT SYMPT:NEG 02/20/2007 2243   CULT  02/20/2007 2243    Multiple bacterial morphotypes present, none predominant. Suggest appropriate recollection if clinically indicated. DEMOGRAPHIC UPDATE OCCURRED ON 06/13 AT 1304, QA FLAGS AND RANGES MAY NO LONGER BE VALID   REPTSTATUS  02/20/2007 2243    02/22/2007 FINAL DEMOGRAPHIC UPDATE OCCURRED ON 06/13 AT 1304, QA FLAGS AND RANGES MAY NO LONGER BE VALID      Labs: Results for orders placed or performed during the hospital encounter of 07/05/16 (from the past 48 hour(s))  Glucose, capillary     Status: Abnormal   Collection Time: 07/17/16  8:15 AM  Result Value Ref Range   Glucose-Capillary 102 (H) 65 - 99 mg/dL  Prepare RBC     Status: None   Collection Time: 07/17/16 11:10 AM  Result Value Ref Range   Order Confirmation ORDER PROCESSED BY BLOOD BANK   Glucose, capillary     Status: Abnormal   Collection Time: 07/17/16 11:49 AM  Result Value Ref Range   Glucose-Capillary 104 (H) 65 - 99 mg/dL  Glucose, capillary     Status: Abnormal   Collection Time: 07/17/16  4:58 PM  Result Value Ref Range   Glucose-Capillary 105 (H) 65 - 99 mg/dL  Occult blood card to lab, stool RN will collect     Status: None   Collection Time: 07/17/16  5:40 PM  Result Value Ref Range   Fecal Occult Bld NEGATIVE NEGATIVE  Glucose, capillary     Status: Abnormal   Collection Time: 07/17/16  8:12 PM  Result Value Ref Range   Glucose-Capillary 104 (H) 65 - 99 mg/dL  Glucose, capillary     Status: Abnormal   Collection Time: 07/18/16 12:38 AM  Result Value Ref Range   Glucose-Capillary 119 (H) 65 - 99 mg/dL  Glucose, capillary     Status: Abnormal   Collection Time: 07/18/16  4:37 AM  Result Value Ref Range   Glucose-Capillary 108 (H) 65 - 99 mg/dL  Glucose, capillary     Status: Abnormal   Collection Time: 07/18/16  8:32 AM   Result Value Ref Range   Glucose-Capillary 122 (H) 65 - 99 mg/dL   Comment 1 Notify RN    Comment 2 Document in Chart   Magnesium     Status: Abnormal   Collection Time: 07/18/16  8:40 AM  Result Value Ref Range   Magnesium 1.4 (L) 1.7 - 2.4 mg/dL  Phosphorus     Status: Abnormal   Collection Time: 07/18/16  8:40 AM  Result Value Ref Range   Phosphorus 1.7 (L) 2.5 - 4.6  mg/dL  CBC     Status: Abnormal   Collection Time: 07/18/16  8:40 AM  Result Value Ref Range   WBC 13.4 (H) 4.0 - 10.5 K/uL   RBC 3.56 (L) 3.87 - 5.11 MIL/uL   Hemoglobin 10.1 (L) 12.0 - 15.0 g/dL    Comment: DELTA CHECK NOTED POST TRANSFUSION SPECIMEN QNS to repeat.    HCT 30.7 (L) 36.0 - 46.0 %   MCV 86.2 78.0 - 100.0 fL   MCH 28.4 26.0 - 34.0 pg   MCHC 32.9 30.0 - 36.0 g/dL   RDW 16.8 (H) 11.5 - 15.5 %   Platelets 159 150 - 400 K/uL  Comprehensive metabolic panel     Status: Abnormal   Collection Time: 07/18/16  8:40 AM  Result Value Ref Range   Sodium 140 135 - 145 mmol/L   Potassium 3.7 3.5 - 5.1 mmol/L   Chloride 110 101 - 111 mmol/L   CO2 22 22 - 32 mmol/L   Glucose, Bld 115 (H) 65 - 99 mg/dL   BUN 9 6 - 20 mg/dL   Creatinine, Ser 0.91 0.44 - 1.00 mg/dL   Calcium 8.2 (L) 8.9 - 10.3 mg/dL   Total Protein 5.2 (L) 6.5 - 8.1 g/dL   Albumin 2.4 (L) 3.5 - 5.0 g/dL   AST 72 (H) 15 - 41 U/L   ALT 49 14 - 54 U/L   Alkaline Phosphatase 93 38 - 126 U/L   Total Bilirubin 4.2 (H) 0.3 - 1.2 mg/dL   GFR calc non Af Amer >60 >60 mL/min   GFR calc Af Amer >60 >60 mL/min    Comment: (NOTE) The eGFR has been calculated using the CKD EPI equation. This calculation has not been validated in all clinical situations. eGFR's persistently <60 mL/min signify possible Chronic Kidney Disease.    Anion gap 8 5 - 15  Lipase, blood     Status: None   Collection Time: 07/18/16  8:40 AM  Result Value Ref Range   Lipase 31 11 - 51 U/L  Glucose, capillary     Status: Abnormal   Collection Time: 07/18/16 11:22 AM   Result Value Ref Range   Glucose-Capillary 102 (H) 65 - 99 mg/dL   Comment 1 Notify RN    Comment 2 Document in Chart   Glucose, capillary     Status: Abnormal   Collection Time: 07/18/16  4:48 PM  Result Value Ref Range   Glucose-Capillary 103 (H) 65 - 99 mg/dL  Glucose, capillary     Status: Abnormal   Collection Time: 07/18/16  8:34 PM  Result Value Ref Range   Glucose-Capillary 106 (H) 65 - 99 mg/dL  Glucose, capillary     Status: None   Collection Time: 07/19/16  4:45 AM  Result Value Ref Range   Glucose-Capillary 99 65 - 99 mg/dL     Lipid Panel     Component Value Date/Time   CHOL 97 (L) 06/08/2016 1008   TRIG 58 07/14/2016 2042   HDL 53 06/08/2016 1008   CHOLHDL 1.8 06/08/2016 1008   VLDL 12 06/08/2016 1008   LDLCALC 32 06/08/2016 1008     Lab Results  Component Value Date   HGBA1C 6.2 (H) 07/08/2016        HPI :*  Dandria Crenshawis a 65 y.o.femalewith PMH as outlined below including but not limited to left CFA to below-knee popliteal artery bypass grafting on 05/30/08. She presented to [facility] ED 07/05/16 with necrotic left second toe.  She was evaluated by vascular surgery the following day who felt that she likely had an occluded bypass graft. She had an aortogram   which confirmed occluded left common femoral artery. She was seen by cardiology, had left heart cath and was cleared for surgery.On 10/26, she was taken to the OR for redo of the leftfemoropopliteal artery bypass graft, Aortobifemoral bypass with 14 x 53m dacron in end to side fashion, Left profunda endarterectomy, Left femoral to below knee popliteal artery bypass with 669mpropaten via redo incisionsFollowing the procedure, she returned to the ICU on the ventilator. PCCM was consulted for management. [facility]  [facility]PITAL COURSE:    Occluded left CFA to below-knee popliteal artery bypass grafting - s/p redo 06/2616. Hx HTN, HLD, PVD -ABI's are much improved post op, patient was  treated with Dilaudid PCA Continue preadmission ASA. Continue statin Continue HCTZ and clonidine, Coreg  Hemodynamically stable  Surgery clearance preop -she was considered high risk due to extensive PAD -Non-invasive stress test demonstrated intermediate high risk -echo w/o wall motion abnormalities, preserved EF and demonstrating grade 1 diastolic HF -[facility]obstructive CAD [facility]  . Preserved LV function. Cardiology  , Dr. JoMartiniqueecommended to avoid stress test in the future due to high false-positive rate , refer to note from 07/12/16  HLD -Recent lipid profile (06/08/16) shows LDL 32, HDL 53 -Simvastatin exchanged to lipitor 80 mg daily as per recommendations from cardiology  VDRF - s/p redo left femoral popliteal bypass graft 06/2616. Extubated Tobacco dependence. Currently 100% on 2 L Mobilize as able.  AKI on CKD: stage 3 at baseline  -most Recent creatinine 0.87(back to baseline), creatinine now 0.91 -will continue holding nephrotoxic agents  -check BMET intermittently to monitor trend   Abdominal pain with abnormal liver function, Develop diarrhea postoperatively. Stool studies negative for C. difficile. She has not had any episodes of diarrhea for several days Continue probiotic Lipase within normal limits, AST 72, bilirubin 4.2 (patient states that she was recently drinking heavily up until June of this year)    HIDA scan within normal limits Continue heart healthy diet   Acute blood loss anemia Continue heparin for DVT prophylaxis, status post 2 units of packed red blood cells 10/29, Continue to follow CBC  Hyperglycemia -A1C 6.2 -will monitor CBG's intermittently and if > 200 will start SSI Speech therapy consult, then start carb modified diet    Acute encephalopathy. Hx stroke (2002). PT OT consult  Protein calorie malnutrition: severe -nutrition supplements as per nutritional service recommendations   Postop  fever-continue incentive spirometry, still has some leukocytosis Chest x-ray 10/27 negative, Foley catheter discontinued UA 10/27, negative  DVT prophylaxsis heparin   Discharge Exam:  Blood pressure (!) 143/76, pulse 88, temperature 98.4 F (36.9 C), temperature source Oral, resp. rate 18, height 5' 4"  (1.626 m), weight 80.1 kg (176 lb 8 oz), SpO2 100 %. General: Middle-aged female, in NAD. Neuro: calm , fc, nonfocal HEENT: JVD increased Cardiovascular: RRR, no M/R/G.  Lungs: CTA B Abdomen: Abdominal midline incision dry , no r/g Musculoskeletal: Surgical incision C/D/I. No gross deformities, no edema.  Dry gangrene seen on left 2nd toe. exts warm to touch.  Skin: Intact, warm, no rashes.     Follow-up Information    CaGildardo CrankerDO. Schedule an appointment as soon as possible for a visit in 2 day(s).   Specialty:  Internal Medicine Why:  Hospital follow-up Contact information: [phone]  Servando Snare, MD. Schedule an appointment as soon as possible for a visit in 2 week(s).   Specialties:  Vascular Surgery, Cardiology Why:  Hospital follow-up Contact information: [address] [place] [address] [phone]           Signed: Reyne Dumas 07/19/2016, 8:07 AM        Time spent >45 mins

## 2016-07-19 NOTE — Progress Notes (Signed)
Occupational Therapy Evaluation Patient Details Name: Wendy Townsend MRN: [id number] DOB: 03/10/1951 Today's Date: 07/19/2016    History of Present Illness pt presents after redo of Aorto Bifemoral bypass grafts and L Fem Pop.  pt with hx of PVD, CVA, and HTN.     Clinical Impression   PTA, pt overall modified independent with ADL and mobility with occasional assistance from family as needed. Pt currently requires mod A with mobility and ADL and is an excellent CIR candidate. Pt very motivated to participate with therapy to return to PLOF  Will follow acutely.     Follow Up Recommendations  CIR;Supervision/Assistance - 24 hour    Equipment Recommendations  None recommended by OT    Recommendations for Other Services Rehab consult     Precautions / Restrictions Precautions Precautions: Fall Restrictions Weight Bearing Restrictions: No      Mobility Bed Mobility Overal bed mobility: Needs Assistance Bed Mobility: Supine to Sit;Sit to Supine  HOB increased. Use of bedrails   Supine to sit: Min assist Sit to supine: Min assist   General bed mobility comments: takes increased time  Transfers Overall transfer level: Needs assistance     Sit to Stand: Max assist;Min assist         General transfer comment: Pt not wanting to put any weight through LLE    Balance     Sitting balance-Leahy Scale: Fair       Standing balance-Leahy Scale: Poor                              ADL Overall ADL's : Needs assistance/impaired     Grooming: Set up;Sitting   Upper Body Bathing: Set up;Sitting   Lower Body Bathing: Moderate assistance;Sit to/from stand   Upper Body Dressing : Set up;Sitting   Lower Body Dressing: Moderate assistance;Sit to/from stand   Toilet Transfer: Moderate assistance;Stand-pivot   Toileting- Clothing Manipulation and Hygiene: Min guard       Functional mobility during ADLs: Moderate assistance General ADL Comments: Pt  states she feels that she is functioning at @ "50%" of her baseline     Vision     Perception     Praxis      Pertinent Vitals/Pain Pain Assessment: Faces Faces Pain Scale: Hurts little more Pain Location: LLE Pain Descriptors / Indicators: Discomfort Pain Intervention(s): Limited activity within patient's tolerance     Hand Dominance Right (Was left "before my stroke")   Extremity/Trunk Assessment Upper Extremity Assessment Upper Extremity Assessment: LUE deficits/detail LUE Deficits / Details: LUE hemiparesis from previsou CVA. Able to use as functional assist   Lower Extremity Assessment LLE Deficits / Details: Edema throughout extremity, sensation appears intact, knee and hip limited extension due to guarding from pain.  Strength overall limited post op by pain.   LLE: Unable to fully assess due to pain LLE Coordination: decreased fine motor;decreased gross motor   Cervical / Trunk Assessment Cervical / Trunk Assessment: Kyphotic   Communication Communication Communication: No difficulties   Cognition Arousal/Alertness: Awake/alert Behavior During Therapy: WFL for tasks assessed/performed Overall Cognitive Status: Within Functional Limits for tasks assessed (most likely baseline impairments)                     General Comments       Exercises       Shoulder Instructions      Home Living Family/patient expects to be discharged to:: Inpatient  rehab                                 Additional Comments: pt indicates her home is accessible since she had her CVA.        Prior Functioning/Environment Level of Independence: Needs assistance  Gait / Transfers Assistance Needed: pt uses [employer] ADL's / Homemaking Assistance Needed: pt has A with all homemaking tasks and family are present when she showers, but she performs on her own.              OT Problem List: Decreased strength;Decreased range of motion;Decreased activity  tolerance;Impaired balance (sitting and/or standing);Decreased knowledge of use of DME or AE;Pain;Increased edema   OT Treatment/Interventions: Self-care/ADL training;Therapeutic exercise;DME and/or AE instruction;Therapeutic activities;Patient/family education;Balance training    OT Goals(Current goals can be found in the care plan section) Acute Rehab OT Goals Patient Stated Goal: Be able to get home OT Goal Formulation: With patient Time For Goal Achievement: 08/02/16 Potential to Achieve Goals: Good  OT Frequency: Min 2X/week   Barriers to D/C:            Co-evaluation              End of Session Equipment Utilized During Treatment: Gait belt Nurse Communication: Mobility status  Activity Tolerance: Patient tolerated treatment well Patient left: in bed;with call bell/phone within reach;with bed alarm set   Time: 1410-1434 OT Time Calculation (min): 24 min Charges:  OT General Charges $OT Visit: 1 Procedure OT Evaluation $OT Eval Moderate Complexity: 1 Procedure OT Treatments $Self Care/Home Management : 8-22 mins G-Codes:    Braiden Rodman,HILLARY 07/24/2016, 2:43 PM   [facility], OTR/L  [phone] 24-Jul-2016

## 2016-07-20 ENCOUNTER — Inpatient Hospital Stay (HOSPITAL_COMMUNITY)
Admission: RE | Admit: 2016-07-20 | Discharge: 2016-07-27 | DRG: 948 | Disposition: A | Discharge status: Home Health | Payer: Commercial Managed Care - HMO | Source: Intra-hospital | Attending: Physical Medicine & Rehabilitation | Admitting: Physical Medicine & Rehabilitation

## 2016-07-20 ENCOUNTER — Inpatient Hospital Stay (HOSPITAL_COMMUNITY): Payer: Commercial Managed Care - HMO | Admitting: Physical Therapy

## 2016-07-20 ENCOUNTER — Inpatient Hospital Stay (HOSPITAL_COMMUNITY): Payer: Commercial Managed Care - HMO | Admitting: Occupational Therapy

## 2016-07-20 DIAGNOSIS — M79672 Pain in left foot: Secondary | ICD-10-CM

## 2016-07-20 DIAGNOSIS — E876 Hypokalemia: Secondary | ICD-10-CM

## 2016-07-20 DIAGNOSIS — I70262 Atherosclerosis of native arteries of extremities with gangrene, left leg: Secondary | ICD-10-CM | POA: Diagnosis not present

## 2016-07-20 DIAGNOSIS — R5381 Other malaise: Principal | ICD-10-CM | POA: Diagnosis present

## 2016-07-20 DIAGNOSIS — Z95828 Presence of other vascular implants and grafts: Secondary | ICD-10-CM

## 2016-07-20 DIAGNOSIS — I2510 Atherosclerotic heart disease of native coronary artery without angina pectoris: Secondary | ICD-10-CM | POA: Diagnosis not present

## 2016-07-20 DIAGNOSIS — F17210 Nicotine dependence, cigarettes, uncomplicated: Secondary | ICD-10-CM | POA: Diagnosis not present

## 2016-07-20 DIAGNOSIS — D62 Acute posthemorrhagic anemia: Secondary | ICD-10-CM | POA: Diagnosis not present

## 2016-07-20 DIAGNOSIS — I739 Peripheral vascular disease, unspecified: Secondary | ICD-10-CM | POA: Diagnosis not present

## 2016-07-20 DIAGNOSIS — Z79899 Other long term (current) drug therapy: Secondary | ICD-10-CM | POA: Diagnosis not present

## 2016-07-20 DIAGNOSIS — I96 Gangrene, not elsewhere classified: Secondary | ICD-10-CM | POA: Diagnosis not present

## 2016-07-20 DIAGNOSIS — R7302 Impaired glucose tolerance (oral): Secondary | ICD-10-CM | POA: Diagnosis not present

## 2016-07-20 DIAGNOSIS — D6489 Other specified anemias: Secondary | ICD-10-CM | POA: Diagnosis not present

## 2016-07-20 DIAGNOSIS — I10 Essential (primary) hypertension: Secondary | ICD-10-CM

## 2016-07-20 DIAGNOSIS — Z7982 Long term (current) use of aspirin: Secondary | ICD-10-CM

## 2016-07-20 DIAGNOSIS — I999 Unspecified disorder of circulatory system: Secondary | ICD-10-CM

## 2016-07-20 DIAGNOSIS — K5900 Constipation, unspecified: Secondary | ICD-10-CM | POA: Diagnosis not present

## 2016-07-20 DIAGNOSIS — E785 Hyperlipidemia, unspecified: Secondary | ICD-10-CM

## 2016-07-20 DIAGNOSIS — R17 Unspecified jaundice: Secondary | ICD-10-CM | POA: Diagnosis not present

## 2016-07-20 DIAGNOSIS — Z8673 Personal history of transient ischemic attack (TIA), and cerebral infarction without residual deficits: Secondary | ICD-10-CM

## 2016-07-20 DIAGNOSIS — G8918 Other acute postprocedural pain: Secondary | ICD-10-CM

## 2016-07-20 DIAGNOSIS — D638 Anemia in other chronic diseases classified elsewhere: Secondary | ICD-10-CM | POA: Diagnosis not present

## 2016-07-20 DIAGNOSIS — R7303 Prediabetes: Secondary | ICD-10-CM | POA: Diagnosis not present

## 2016-07-20 DIAGNOSIS — L03032 Cellulitis of left toe: Secondary | ICD-10-CM

## 2016-07-20 DIAGNOSIS — I70269 Atherosclerosis of native arteries of extremities with gangrene, unspecified extremity: Secondary | ICD-10-CM | POA: Diagnosis not present

## 2016-07-20 DIAGNOSIS — Z720 Tobacco use: Secondary | ICD-10-CM | POA: Diagnosis present

## 2016-07-20 LAB — CBC WITH DIFFERENTIAL/PLATELET
BASOS ABS: 0 10*3/uL (ref 0.0–0.1)
Basophils Relative: 0 %
Eosinophils Absolute: 0.1 10*3/uL (ref 0.0–0.7)
Eosinophils Relative: 1 %
HEMATOCRIT: 31.4 % — AB (ref 36.0–46.0)
HEMOGLOBIN: 10.8 g/dL — AB (ref 12.0–15.0)
LYMPHS PCT: 25 %
Lymphs Abs: 3.1 10*3/uL (ref 0.7–4.0)
MCH: 28.9 pg (ref 26.0–34.0)
MCHC: 34.4 g/dL (ref 30.0–36.0)
MCV: 84 fL (ref 78.0–100.0)
MONOS PCT: 11 %
Monocytes Absolute: 1.3 10*3/uL — ABNORMAL HIGH (ref 0.1–1.0)
NEUTROS ABS: 7.7 10*3/uL (ref 1.7–7.7)
NEUTROS PCT: 63 %
Platelets: 208 10*3/uL (ref 150–400)
RBC: 3.74 MIL/uL — ABNORMAL LOW (ref 3.87–5.11)
RDW: 16.4 % — ABNORMAL HIGH (ref 11.5–15.5)
WBC: 12.2 10*3/uL — ABNORMAL HIGH (ref 4.0–10.5)

## 2016-07-20 LAB — COMPREHENSIVE METABOLIC PANEL
ALBUMIN: 2.5 g/dL — AB (ref 3.5–5.0)
ALK PHOS: 112 U/L (ref 38–126)
ALT: 48 U/L (ref 14–54)
ANION GAP: 11 (ref 5–15)
AST: 68 U/L — AB (ref 15–41)
BILIRUBIN TOTAL: 4.2 mg/dL — AB (ref 0.3–1.2)
BUN: 8 mg/dL (ref 6–20)
CALCIUM: 8.3 mg/dL — AB (ref 8.9–10.3)
CO2: 26 mmol/L (ref 22–32)
CREATININE: 1.03 mg/dL — AB (ref 0.44–1.00)
Chloride: 99 mmol/L — ABNORMAL LOW (ref 101–111)
GFR calc Af Amer: >60 mL/min (ref >60)
GFR calc non Af Amer: 56 mL/min — ABNORMAL LOW (ref >60)
GLUCOSE: 98 mg/dL (ref 65–99)
Potassium: 3.1 mmol/L — ABNORMAL LOW (ref 3.5–5.1)
SODIUM: 136 mmol/L (ref 135–145)
TOTAL PROTEIN: 5.4 g/dL — AB (ref 6.5–8.1)

## 2016-07-20 LAB — CBC
HCT: 30 % — ABNORMAL LOW (ref 36.0–46.0)
Hemoglobin: 10.2 g/dL — ABNORMAL LOW (ref 12.0–15.0)
MCH: 28.5 pg (ref 26.0–34.0)
MCHC: 34 g/dL (ref 30.0–36.0)
MCV: 83.8 fL (ref 78.0–100.0)
PLATELETS: 214 10*3/uL (ref 150–400)
RBC: 3.58 MIL/uL — ABNORMAL LOW (ref 3.87–5.11)
RDW: 16.3 % — ABNORMAL HIGH (ref 11.5–15.5)
WBC: 12.8 10*3/uL — ABNORMAL HIGH (ref 4.0–10.5)

## 2016-07-20 LAB — GLUCOSE, CAPILLARY
GLUCOSE-CAPILLARY: 100 mg/dL — AB (ref 65–99)
GLUCOSE-CAPILLARY: 86 mg/dL (ref 65–99)
GLUCOSE-CAPILLARY: 99 mg/dL (ref 65–99)
Glucose-Capillary: 91 mg/dL (ref 65–99)
Glucose-Capillary: 94 mg/dL (ref 65–99)
Glucose-Capillary: 106 mg/dL — ABNORMAL HIGH (ref 65–99)

## 2016-07-20 MED ORDER — ACETAMINOPHEN 650 MG RE SUPP: 650.0000 mg | Freq: Four times a day (QID) | RECTAL | Status: DC | PRN

## 2016-07-20 MED ORDER — ONDANSETRON HCL 4 MG PO TABS
4.0000 mg | ORAL_TABLET | Freq: Four times a day (QID) | ORAL | Status: DC | PRN
Start: 2016-07-20 — End: 2016-07-27

## 2016-07-20 MED ORDER — HEPARIN SODIUM (PORCINE) 5000 UNIT/ML IJ SOLN
5000.0000 [IU] | Freq: Three times a day (TID) | INTRAMUSCULAR | Status: DC
  Administered 2016-07-20 – 2016-07-27 (×18): 5000 [IU] via SUBCUTANEOUS
  Filled 2016-07-20 (×19): qty 1

## 2016-07-20 MED ORDER — HYDROCHLOROTHIAZIDE 25 MG PO TABS
25.0000 mg | ORAL_TABLET | Freq: Every day | ORAL | Status: DC
  Administered 2016-07-21 – 2016-07-27 (×7): 25 mg via ORAL
  Filled 2016-07-20 (×7): qty 1

## 2016-07-20 MED ORDER — OXYCODONE HCL 5 MG PO TABS
10.0000 mg | ORAL_TABLET | ORAL | Status: DC | PRN
  Filled 2016-07-20: qty 2

## 2016-07-20 MED ORDER — INSULIN ASPART 100 UNIT/ML ~~LOC~~ SOLN
0.0000 [IU] | Freq: Three times a day (TID) | SUBCUTANEOUS | Status: DC
  Administered 2016-07-23 – 2016-07-25 (×2): 2 [IU] via SUBCUTANEOUS

## 2016-07-20 MED ORDER — ACETAMINOPHEN 325 MG PO TABS
650.0000 mg | ORAL_TABLET | Freq: Four times a day (QID) | ORAL | Status: DC | PRN
  Administered 2016-07-20 – 2016-07-25 (×12): 650 mg via ORAL
  Filled 2016-07-20 (×16): qty 2

## 2016-07-20 MED ORDER — ATORVASTATIN CALCIUM 80 MG PO TABS
80.0000 mg | ORAL_TABLET | Freq: Every day | ORAL | Status: DC
  Administered 2016-07-20 – 2016-07-26 (×7): 80 mg
  Filled 2016-07-20 (×8): qty 1

## 2016-07-20 MED ORDER — SORBITOL 70 % SOLN: 30.0000 mL | Freq: Every day | Status: DC | PRN

## 2016-07-20 MED ORDER — ONDANSETRON HCL 4 MG/2ML IJ SOLN: 4.0000 mg | Freq: Four times a day (QID) | INTRAMUSCULAR | Status: DC | PRN

## 2016-07-20 MED ORDER — INSULIN ASPART 100 UNIT/ML ~~LOC~~ SOLN: 0.0000 [IU] | SUBCUTANEOUS | Status: DC

## 2016-07-20 MED ORDER — HEPARIN SODIUM (PORCINE) 5000 UNIT/ML IJ SOLN: 5000.0000 [IU] | Freq: Three times a day (TID) | INTRAMUSCULAR | Status: DC

## 2016-07-20 MED ORDER — SACCHAROMYCES BOULARDII 250 MG PO CAPS
250.0000 mg | ORAL_CAPSULE | Freq: Two times a day (BID) | ORAL | Status: DC
  Administered 2016-07-20 – 2016-07-27 (×14): 250 mg via ORAL
  Filled 2016-07-20 (×14): qty 1

## 2016-07-20 MED ORDER — ASPIRIN 81 MG PO CHEW
81.0000 mg | CHEWABLE_TABLET | Freq: Every day | ORAL | Status: DC
  Administered 2016-07-21 – 2016-07-27 (×7): 81 mg via ORAL
  Filled 2016-07-20 (×7): qty 1

## 2016-07-20 MED ORDER — NICOTINE 7 MG/24HR TD PT24
7.0000 mg | MEDICATED_PATCH | Freq: Every day | TRANSDERMAL | Status: DC
  Administered 2016-07-21 – 2016-07-27 (×7): 7 mg via TRANSDERMAL
  Filled 2016-07-20 (×7): qty 1

## 2016-07-20 MED ORDER — FAMOTIDINE 20 MG PO TABS
20.0000 mg | ORAL_TABLET | Freq: Two times a day (BID) | ORAL | Status: DC
  Administered 2016-07-20 – 2016-07-27 (×14): 20 mg via ORAL
  Filled 2016-07-20 (×7): qty 1
  Filled 2016-07-20: qty 2
  Filled 2016-07-20 (×7): qty 1

## 2016-07-20 MED ORDER — CLONIDINE HCL 0.2 MG/24HR TD PTWK
0.2000 mg | MEDICATED_PATCH | TRANSDERMAL | Status: DC
  Administered 2016-07-22: 0.2 mg via TRANSDERMAL
  Filled 2016-07-20: qty 1

## 2016-07-20 MED ORDER — CARVEDILOL 3.125 MG PO TABS
3.1250 mg | ORAL_TABLET | Freq: Two times a day (BID) | ORAL | Status: DC
  Administered 2016-07-20 – 2016-07-27 (×14): 3.125 mg via ORAL
  Filled 2016-07-20 (×15): qty 1

## 2016-07-20 MED ORDER — OXYCODONE HCL ER 15 MG PO T12A
15.0000 mg | EXTENDED_RELEASE_TABLET | Freq: Two times a day (BID) | ORAL | Status: DC
  Administered 2016-07-20 – 2016-07-27 (×14): 15 mg via ORAL
  Filled 2016-07-20 (×14): qty 1

## 2016-07-20 NOTE — Progress Notes (Signed)
Physical Therapy Treatment Patient Details Name: Wendy Townsend MRN: [id number] DOB: 15-Aug-1951 Today's Date: 07/20/2016    History of Present Illness pt presents after redo of Aorto Bifemoral bypass grafts and L Fem Pop.  pt with hx of PVD, CVA, and HTN.      PT Comments    Pt progressing with mobility with increased ability with transfers and able to ambulate into hall with chair follow today. Pt educated for need to increase knee extension LLE and not to rest with it bent. Pt educated for HEP, progression and plan with encouragement to ambulate to bathroom with nursing and RW. Will follow.   HR 125 with gait  Follow Up Recommendations  CIR;Supervision/Assistance - 24 hour     Equipment Recommendations       Recommendations for Other Services       Precautions / Restrictions Precautions Precautions: Fall    Mobility  Bed Mobility               General bed mobility comments: EOB on arrival  Transfers Overall transfer level: Needs assistance   Transfers: Sit to/from Stand Sit to Stand: Min assist         General transfer comment: cues for hand placement, safety and sequence x 2 trials  Ambulation/Gait Ambulation/Gait assistance: Min assist Ambulation Distance (Feet): 25 Feet Assistive device: Rolling walker (2 wheeled) Gait Pattern/deviations: Step-to pattern;Decreased stance time - left   Gait velocity interpretation: Below normal speed for age/gender General Gait Details: cues for posture, position in RW, off weighting LLE with arms, chair to follow. Pt walked 20' then 25' after seated rest   Stairs            Wheelchair Mobility    Modified Rankin (Stroke Patients Only)       Balance Overall balance assessment: Needs assistance   Sitting balance-Leahy Scale: Good       Standing balance-Leahy Scale: Poor                      Cognition Arousal/Alertness: Awake/alert Behavior During Therapy: WFL for tasks  assessed/performed Overall Cognitive Status: Within Functional Limits for tasks assessed                      Exercises General Exercises - Lower Extremity Long Arc Quad: AAROM;AROM;10 reps;Left;Right;Seated (AAROM LLE) Hip Flexion/Marching: AROM;Both;10 reps;Seated    General Comments        Pertinent Vitals/Pain Pain Assessment: No/denies pain Pain Location: LLE with standing 6/10, at rest no pain Pain Intervention(s): Limited activity within patient's tolerance;Monitored during session;Repositioned    Home Living                      Prior Function            PT Goals (current goals can now be found in the care plan section) Progress towards PT goals: Progressing toward goals    Frequency           PT Plan Current plan remains appropriate    Co-evaluation             End of Session Equipment Utilized During Treatment: Gait belt Activity Tolerance: Patient tolerated treatment well Patient left: in chair;with call bell/phone within reach;with chair alarm set     Time: [phone] PT Time Calculation (min) (ACUTE ONLY): 21 min  Charges:  $Gait Training: 8-22 mins  G CodesLanetta Inch Beth 07/26/16, 10:05 AM  Elwyn Reach, [place]

## 2016-07-20 NOTE — H&P (Signed)
Physical Medicine and Rehabilitation Admission H&P    Chief Complaint  Patient presents with  . Toe Injury  : HPI: Wendy Townsend is a 65 y.o. right handed female with history of CVA maintained on aspirin receiving inpatient rehabilitation services 2012, hypertension, tobacco abuse, PVD with necrotic toe with multiple revascularization procedures. Per chart review patient lives with family and assistance as needed. Used a cane prior to admission. One level home. Presented 07/05/2016 with nonhealing wound of her second left toe after she splashed grease on it in early September. She had been initially treated with doxycycline later switched to Levaquin after wound culture showed gram-negative rods and moderate GPC in pairs. Aortogram imaging revealed thrombosed left femoral-popliteal bypass due to left iliac arterial occlusion and right external iliac artery occlusion. Underwent preoperative clearance by cardiology services. Echocardiogram with ejection fraction of 97% grade 1 diastolic dysfunction as well as preoperative cardiac catheterization showing mild to moderate nonobstructive CAD. Normal left ventricular filling pressure. No high-grade stenosis to prevent proceeding with urgent vascular surgery. Underwent aortobifemoral bypass as well as redo left femoral exposure left femoral-popliteal bypass 07/14/2016 per Dr. Bridgett Larsson.  Hospital course pain management. Subcutaneous heparin for DVT prophylaxis. Acute surgical blood loss anemia received 2 units packed red blood cells. Intermittent bouts of diarrhea some abdominal pain. Clostridium difficile negative. Total bilirubin 4.2 Hida scan was normal . Physical and occupational therapy evaluations completed with recommendations of physical medicine rehabilitation consult. Patient was admitted for a comprehensive rehabilitation program  ROS Constitutional: Negative for chills and fever.  HENT: Negative for hearing loss.   Eyes: Negative for blurred  vision and double vision.  Respiratory: Positive for cough and shortness of breath.   Cardiovascular: Positive for leg swelling. Negative for chest pain and palpitations.  Gastrointestinal: Positive for constipation. Negative for nausea and vomiting.  Genitourinary: Negative for dysuria and hematuria.  Musculoskeletal: Positive for myalgias.  Skin: Negative for rash.  Neurological: Positive for weakness, L>R LE. Negative for seizures and headaches.  All other systems reviewed and are negative   Past Medical History:  Diagnosis Date  . Hyperlipidemia   . Hypertension   . Peripheral vascular disease ([place])    vein surgery, left leg  . Stroke [facility]) 2002   ongoing mild loss of feeling in left side, had to learn to write right-handed  . Tobacco abuse    Past Surgical History:  Procedure Laterality Date  . AORTA - BILATERAL FEMORAL ARTERY BYPASS GRAFT N/A 07/14/2016   Procedure: AORTOBIFEMORAL BYPASS GRAFT;  Surgeon: Waynetta Sandy, MD;  Location: [place];  Service: Vascular;  Laterality: N/A;  . CARDIAC CATHETERIZATION N/A 07/11/2016   Procedure: Left Heart Cath and Coronary Angiography;  Surgeon: Nelva Bush, MD;  Location: [place] [facility];  Service: Cardiovascular;  Laterality: N/A;  . FEMORAL-POPLITEAL BYPASS GRAFT Left 07/14/2016   Procedure: REDO BYPASS GRAFT FEMORAL-POPLITEAL ARTERY;  Surgeon: Waynetta Sandy, MD;  Location: [place];  Service: Vascular;  Laterality: Left;  . PERIPHERAL VASCULAR CATHETERIZATION N/A 07/08/2016   Procedure: Abdominal Aortogram w/ Bilateral Lower Extremity Runoff;  Surgeon: Elam Dutch, MD;  Location: [place] [facility];  Service: Cardiovascular;  Laterality: N/A;  . VEIN SURGERY Left    Family History  Problem Relation Age of Onset  . Hypertension Mother 87   Social History:  reports that she has been smoking Cigarettes.  She has smoked for the past 25.00 years. She has never used smokeless tobacco. She reports that she  drinks  alcohol. She reports that she uses drugs, including Marijuana. Allergies:  Allergies  Allergen Reactions  . Penicillins Swelling    Has patient had a PCN reaction causing immediate rash, facial/tongue/throat swelling, SOB or lightheadedness with hypotension: YES Has patient had a PCN reaction causing severe rash involving mucus membranes or skin necrosis: NO Has patient had a PCN reaction that required hospitalization NO Has patient had a PCN reaction occurring within the last 10 years: NO If all of the above answers are "NO", then may proceed with Cephalosporin use.   Medications Prior to Admission  Medication Sig Dispense Refill  . aspirin EC 81 MG tablet TAKE 1 TABLET (325 MG TOTAL) BY MOUTH DAILY. 30 tablet 11  . cloNIDine (CATAPRES) 0.2 MG tablet Take 1 tablet (0.2 mg total) by mouth 2 (two) times daily. 60 tablet 11  . hydrochlorothiazide (HYDRODIURIL) 25 MG tablet Take 0.5 tablets (12.5 mg total) by mouth daily. (Patient taking differently: Take 12.5 mg by mouth every other day. ) 15 tablet 11  . lisinopril (PRINIVIL,ZESTRIL) 40 MG tablet Take 1 tablet (40 mg total) by mouth daily. 30 tablet 11  . potassium chloride (MICRO-K) 10 MEQ CR capsule TAKE 1 TABLET (10 MEQ TOTAL) BY MOUTH DAILY. 31 capsule 11  . saccharomyces boulardii (FLORASTOR) 250 MG capsule Take 1 capsule (250 mg total) by mouth 2 (two) times daily. 60 capsule 0  . simvastatin (ZOCOR) 40 MG tablet TAKE 1 TABLET (40 MG TOTAL) BY MOUTH AT BEDTIME. 30 tablet 11    Home: Home Living Family/patient expects to be discharged to:: Inpatient rehab Living Arrangements: Other relatives Additional Comments: pt indicates her home is accessible since she had her CVA.     Functional History: Prior Function Level of Independence: Needs assistance Gait / Transfers Assistance Needed: pt uses [employer] ADL's / Homemaking Assistance Needed: pt has A with all homemaking tasks and family are present when she showers, but she  performs on her own.    Functional Status:  Mobility: Bed Mobility Overal bed mobility: Needs Assistance Bed Mobility: Supine to Sit, Sit to Supine Supine to sit: Max assist Sit to supine: Mod assist General bed mobility comments: A with bringing LEs to EOB and bringing trunk up to sitting.  pt only needed A for LEs with return to bed.  pt indicates abdominal incision limits her ability.   Transfers Overall transfer level: Needs assistance Equipment used: Rolling walker (2 wheeled) Transfers: Sit to/from Stand Sit to Stand: Max assist General transfer comment: cues for UE use and scooting closer to EOB for coming to standing.  pt needs heavy power up to standing and A to keep pt over midline as she tends to lean hard to the R.   Ambulation/Gait Ambulation/Gait assistance: Mod assist Ambulation Distance (Feet): 3 Feet (forward and back) Assistive device: Rolling walker (2 wheeled) Gait Pattern/deviations: Step-to pattern, Decreased step length - left, Decreased stance time - left, Trunk flexed General Gait Details: pt with minimal weightbearing on L LE and seems generally weak in Bil UEs, which limit her use of RW.  cues for more upright posture.      ADL:    Cognition: Cognition Overall Cognitive Status: Within Functional Limits for tasks assessed Orientation Level: Oriented X4 Cognition Arousal/Alertness: Awake/alert Behavior During Therapy: WFL for tasks assessed/performed Overall Cognitive Status: Within Functional Limits for tasks assessed  Physical Exam: Blood pressure (!) 149/65, pulse 87, temperature 98.6 F (37 C), temperature source Oral, resp. rate 17, height 5' 4" (1.626  m), weight 80.1 kg (176 lb 8 oz), SpO2 100 %. Physical Exam Constitutional: She is oriented to person, place, and time. She appears well-developed and well-nourished.  65 year old right-handed female.  HENT:  Head: Normocephalic and atraumatic.  Poor dentition  Eyes: EOM are normal. Scleral  icterus  is present.  Neck: Normal range of motion. Neck supple. No thyromegaly present.  Cardiovascular: Normal rate and regular rhythm.   PVCs  Respiratory: Effort normal and breath sounds normal. No respiratory distress.  GI: Soft. Bowel sounds are normal. She exhibits no distension.  Musculoskeletal: She exhibits edema and tenderness.  Neurological: She is alert and oriented to person, place, and time.  Follow simple commands Motor: B/l UE: 4/5 proximal to distal RLE: Hip flexion 3-/5, knee extension 4/5, ankle dorsi/plantar flexion 4+/5 LLE: Hip flexion 2/5, knee extension 2+/5, ankle dorsi/plantar flexion 3/5  Skin:  Revascularization sites clean and dry LLE scaly with vascular changes, dry gangrene 2nd digit  Psychiatric: She has a normal mood and affect. Her behavior is normal   Results for orders placed or performed during the hospital encounter of 07/05/16 (from the past 48 hour(s))  Glucose, capillary     Status: Abnormal   Collection Time: 07/17/16  4:58 PM  Result Value Ref Range   Glucose-Capillary 105 (H) 65 - 99 mg/dL  Occult blood card to lab, stool RN will collect     Status: None   Collection Time: 07/17/16  5:40 PM  Result Value Ref Range   Fecal Occult Bld NEGATIVE NEGATIVE  Glucose, capillary     Status: Abnormal   Collection Time: 07/17/16  8:12 PM  Result Value Ref Range   Glucose-Capillary 104 (H) 65 - 99 mg/dL  Glucose, capillary     Status: Abnormal   Collection Time: 07/18/16 12:38 AM  Result Value Ref Range   Glucose-Capillary 119 (H) 65 - 99 mg/dL  Glucose, capillary     Status: Abnormal   Collection Time: 07/18/16  4:37 AM  Result Value Ref Range   Glucose-Capillary 108 (H) 65 - 99 mg/dL  Glucose, capillary     Status: Abnormal   Collection Time: 07/18/16  8:32 AM  Result Value Ref Range   Glucose-Capillary 122 (H) 65 - 99 mg/dL   Comment 1 Notify RN    Comment 2 Document in Chart   Magnesium     Status: Abnormal   Collection Time: 07/18/16   8:40 AM  Result Value Ref Range   Magnesium 1.4 (L) 1.7 - 2.4 mg/dL  Phosphorus     Status: Abnormal   Collection Time: 07/18/16  8:40 AM  Result Value Ref Range   Phosphorus 1.7 (L) 2.5 - 4.6 mg/dL  CBC     Status: Abnormal   Collection Time: 07/18/16  8:40 AM  Result Value Ref Range   WBC 13.4 (H) 4.0 - 10.5 K/uL   RBC 3.56 (L) 3.87 - 5.11 MIL/uL   Hemoglobin 10.1 (L) 12.0 - 15.0 g/dL    Comment: DELTA CHECK NOTED POST TRANSFUSION SPECIMEN QNS to repeat.    HCT 30.7 (L) 36.0 - 46.0 %   MCV 86.2 78.0 - 100.0 fL   MCH 28.4 26.0 - 34.0 pg   MCHC 32.9 30.0 - 36.0 g/dL   RDW 16.8 (H) 11.5 - 15.5 %   Platelets 159 150 - 400 K/uL  Comprehensive metabolic panel     Status: Abnormal   Collection Time: 07/18/16  8:40 AM  Result Value Ref Range   Sodium  140 135 - 145 mmol/L   Potassium 3.7 3.5 - 5.1 mmol/L   Chloride 110 101 - 111 mmol/L   CO2 22 22 - 32 mmol/L   Glucose, Bld 115 (H) 65 - 99 mg/dL   BUN 9 6 - 20 mg/dL   Creatinine, Ser 0.91 0.44 - 1.00 mg/dL   Calcium 8.2 (L) 8.9 - 10.3 mg/dL   Total Protein 5.2 (L) 6.5 - 8.1 g/dL   Albumin 2.4 (L) 3.5 - 5.0 g/dL   AST 72 (H) 15 - 41 U/L   ALT 49 14 - 54 U/L   Alkaline Phosphatase 93 38 - 126 U/L   Total Bilirubin 4.2 (H) 0.3 - 1.2 mg/dL   GFR calc non Af Amer >60 >60 mL/min   GFR calc Af Amer >60 >60 mL/min    Comment: (NOTE) The eGFR has been calculated using the CKD EPI equation. This calculation has not been validated in all clinical situations. eGFR's persistently <60 mL/min signify possible Chronic Kidney Disease.    Anion gap 8 5 - 15  Lipase, blood     Status: None   Collection Time: 07/18/16  8:40 AM  Result Value Ref Range   Lipase 31 11 - 51 U/L  Glucose, capillary     Status: Abnormal   Collection Time: 07/18/16 11:22 AM  Result Value Ref Range   Glucose-Capillary 102 (H) 65 - 99 mg/dL   Comment 1 Notify RN    Comment 2 Document in Chart   Glucose, capillary     Status: Abnormal   Collection Time:  07/18/16  4:48 PM  Result Value Ref Range   Glucose-Capillary 103 (H) 65 - 99 mg/dL  Glucose, capillary     Status: Abnormal   Collection Time: 07/18/16  8:34 PM  Result Value Ref Range   Glucose-Capillary 106 (H) 65 - 99 mg/dL  Glucose, capillary     Status: None   Collection Time: 07/19/16  4:45 AM  Result Value Ref Range   Glucose-Capillary 99 65 - 99 mg/dL  Glucose, capillary     Status: Abnormal   Collection Time: 07/19/16  8:34 AM  Result Value Ref Range   Glucose-Capillary 104 (H) 65 - 99 mg/dL   Comment 1 Notify RN    Comment 2 Document in Chart   Comprehensive metabolic panel     Status: Abnormal   Collection Time: 07/19/16 10:43 AM  Result Value Ref Range   Sodium 138 135 - 145 mmol/L   Potassium 3.4 (L) 3.5 - 5.1 mmol/L   Chloride 103 101 - 111 mmol/L   CO2 25 22 - 32 mmol/L   Glucose, Bld 96 65 - 99 mg/dL   BUN 6 6 - 20 mg/dL   Creatinine, Ser 0.95 0.44 - 1.00 mg/dL   Calcium 8.3 (L) 8.9 - 10.3 mg/dL   Total Protein 5.1 (L) 6.5 - 8.1 g/dL   Albumin 2.5 (L) 3.5 - 5.0 g/dL   AST 49 (H) 15 - 41 U/L   ALT 40 14 - 54 U/L   Alkaline Phosphatase 94 38 - 126 U/L   Total Bilirubin 3.7 (H) 0.3 - 1.2 mg/dL   GFR calc non Af Amer >60 >60 mL/min   GFR calc Af Amer >60 >60 mL/min    Comment: (NOTE) The eGFR has been calculated using the CKD EPI equation. This calculation has not been validated in all clinical situations. eGFR's persistently <60 mL/min signify possible Chronic Kidney Disease.    Anion gap 10  5 - 15  Magnesium     Status: Abnormal   Collection Time: 07/19/16 10:43 AM  Result Value Ref Range   Magnesium 1.5 (L) 1.7 - 2.4 mg/dL  Glucose, capillary     Status: None   Collection Time: 07/19/16 11:42 AM  Result Value Ref Range   Glucose-Capillary 96 65 - 99 mg/dL   Nm Hepatobiliary Liver Func  Result Date: 07/19/2016 CLINICAL DATA:  Abdominal pain. EXAM: NUCLEAR MEDICINE HEPATOBILIARY IMAGING TECHNIQUE: Sequential images of the abdomen were obtained out  to 60 minutes following intravenous administration of radiopharmaceutical. RADIOPHARMACEUTICALS:  5.0 MCi Tc-11m Choletec IV COMPARISON:  [facility] 07/14/2016. FINDINGS: Prompt uptake and biliary excretion of activity by the liver is seen. Gallbladder activity is visualized, consistent with patency of cystic duct. Biliary activity passes into small bowel, consistent with patent common bile duct. IMPRESSION: Normal exam. Electronically Signed   By: TMarcello Moores Register   On: 07/19/2016 10:56       Medical Problem List and Plan: 1.  Debilitation secondary to redo aortobifemoral bypass graft and left femoral-popliteal/multi-medical 2.  DVT Prophylaxis/Anticoagulation: Subcutaneous heparin. Monitor platelet counts of any signs of bleeding 3. Pain Management: OxyContin 15 mg every 12 hours, oxycodone immediate release as needed 4. Acute on chronic anemia. Latest hemoglobin 10.1 5. Neuropsych: This patient is capable of making decisions on her own behalf. 6. Skin/Wound Care: Routine skin checks 7. Fluids/Electrolytes/Nutrition: Routine I&O with follow-up chemistries 8. History of CVA. Aspirin 81 mg daily 9. Hypertension. Coreg 3.125 mg twice a day, HCTZ 25 mg daily clonidine patch 0.2 mg weekly. Monitor with increased mobility 10. Impaired glucose intolerance. Hemoglobin A1c 6.2. Continue sliding scale. 11. Tobacco abuse. Counseling. Nicoderm patch. 12. Hyperlipidemia. Lipitor 13. Elevated total bilirubin. Hida scan unremarkable. . Follow-up chemistries  Post Admission Physician Evaluation: 1. Functional deficits secondary  to debility. 2. Patient is admitted to receive collaborative, interdisciplinary care between the physiatrist, rehab nursing staff, and therapy team. 3. Patient's level of medical complexity and substantial therapy needs in context of that medical necessity cannot be provided at a lesser intensity of care such as a SNF. 4. Patient has experienced substantial functional loss from his/her  baseline which was documented above under the "Functional History" and "Functional Status" headings.  Judging by the patient's diagnosis, physical exam, and functional history, the patient has potential for functional progress which will result in measurable gains while on inpatient rehab.  These gains will be of substantial and practical use upon discharge  in facilitating mobility and self-care at the household level. 5. Physiatrist will provide 24 hour management of medical needs as well as oversight of the therapy plan/treatment and provide guidance as appropriate regarding the interaction of the two. 6. 24 hour rehab nursing will assist with bladder management, bowel management, safety, skin/wound care, disease management, pain management and patient education and help integrate therapy concepts, techniques,education, etc. 7. PT will assess and treat for/with: Lower extremity strength, range of motion, stamina, balance, functional mobility, safety, adaptive techniques and equipment, woundcare, coping skills, pain control, education.   Goals are: Supervision/ Mod I. 8. OT will assess and treat for/with: ADL's, functional mobility, safety, upper extremity strength, adaptive techniques and equipment, wound mgt, ego support, and community reintegration.   Goals are: Supervision/Mod I. Therapy may not proceed with showering this patient. 9. Case Management and Social Worker will assess and treat for psychological issues and discharge planning. 10. Team conference will be held weekly to assess progress toward goals and to determine  barriers to discharge. 11. Patient will receive at least 3 hours of therapy per day at least 5 days per week. 12. ELOS: 12-16 days.       13. Prognosis:  good  Delice Lesch, MD, Mellody Drown 07/19/2016

## 2016-07-20 NOTE — PMR Pre-admission (Signed)
PMR Admission Coordinator Pre-Admission Assessment  Patient: Wendy Townsend is an 66 y.o., female MRN: [id number] DOB: 1951/07/17 Height: 5\' 4"  (162.6 cm) Weight: 80.1 kg (176 lb 8 oz)              Insurance Information HMO: yes, Gold Plus HMO    PPO:      PCP:      IPA:      80/20:      OTHER:  PRIMARY: [employer] ([employer])      Policy#:  [id number]      Subscriber:  self CM Name:  Margreta Journey      Phone#:  [phone]     Fax#:  [phone] Pre-Cert#:  [id number] , authorized for 7 days from 07/20/16-07/26/16; updates will be done via EMR access     Employer:  Not employed Benefits:  Phone #:  [phone]     Name:  Summer Eff. Date:  09/20/15     Deduct:  $0      Out of Pocket Max:  $5900      Life Max:  n/a CIR:  $295 days 1-6; $0 days 7 and beyond      SNF:  $0 days 1-20; $160 days 21-100 Outpatient:  $40 per visit     Co-Pay:  Home Health:  100%      Co-Pay:  DME:  80%     Co-Pay:  20% Providers:  In network SECONDARY:       Policy#:       Subscriber:  CM Name:       Phone#:      Fax#:  Pre-Cert#:       Employer:  Benefits:  Phone #:      Name:  Eff. Date:      Deduct:       Out of Pocket Max:       Life Max:  CIR:       SNF:  Outpatient:      Co-Pay:  Home Health:       Co-Pay:  DME:      Co-Pay:   Medicaid Application Date:       Case Manager:  Disability Application Date:       Case Worker:   Emergency Contact Information Contact Information    Name Relation Home Work Mobile   Harper,Yashekia Niece [phone]       Current Medical History  Patient Admitting Diagnosis: Debilitation after redo aortobifemoral bypass graft and left femoral popliteal History of Present Illness: Tyhesha Crenshawis a 65 y.o.right handed femalewith history of CVA maintained on aspirin receiving inpatient rehabilitation services 2012, hypertension, tobacco abuse, PVD with necrotic toe with multiple revascularization procedures.Per chart review patient lives with family and assistance  as needed. Used a cane prior to admission. One level home.Presented10/17/2017 with nonhealing wound of her second left toe after she splashed grease on it in early September. She had been initially treated with doxycycline later switched to Levaquin after wound culture showed gram-negative rods and moderate GPC in pairs.. Aortogram imaging revealed thrombosed left femoral-popliteal bypass due to left iliac arterial occlusion and right external iliac artery occlusion. Underwent preoperative clearance by cardiology services. Echocardiogram with ejection fraction of 33% grade 1 diastolic dysfunction as well as preoperative cardiac catheterization showing mild to moderate nonobstructive CAD. Normal left ventricular filling pressure. No high-grade stenosis to prevent proceeding with urgent vascular surgery. Underwent aortobifemoral bypass as well as redo left femoral exposure left femoral-popliteal bypass 07/14/2016 per Dr. Bridgett Larsson.  Hospital  course pain management. Subcutaneous heparin for DVT prophylaxis. Acute surgical blood loss anemia received 2 units packed red blood cells. Intermittent bouts of diarrhea some abdominal pain. Clostridium difficile negative. Total bilirubin 4.2 Hida scan was normal .Physical and occupational therapy evaluations completed 07/18/2016 with recommendations of physical medicine rehabilitation consult.Patient was admitted for a comprehensive rehabilitation program       Past Medical History  Past Medical History:  Diagnosis Date  . Hyperlipidemia   . Hypertension   . Peripheral vascular disease ([place])    vein surgery, left leg  . Stroke [facility]) 2002   ongoing mild loss of feeling in left side, had to learn to write right-handed  . Tobacco abuse     Family History  family history includes Hypertension (age of onset: 77) in her mother.  Prior Rehab/Hospitalizations:  Has the patient had major surgery during 100 days prior to admission? No  Current Medications   Current  Facility-Administered Medications:  .  acetaminophen (TYLENOL) tablet 650 mg, 650 mg, Oral, Q6H PRN, 650 mg at 07/20/16 0610 **OR** acetaminophen (TYLENOL) suppository 650 mg, 650 mg, Rectal, Q6H PRN, Karmen Bongo, MD .  atorvastatin (LIPITOR) tablet 80 mg, 80 mg, Per Tube, q1800, Rahul P Desai, PA-C, 80 mg at 07/19/16 1738 .  carvedilol (COREG) tablet 3.125 mg, 3.125 mg, Oral, BID WC, Reyne Dumas, MD, 3.125 mg at 07/20/16 0836 .  cloNIDine (CATAPRES - Dosed in mg/24 hr) patch 0.2 mg, 0.2 mg, Transdermal, Weekly, Tasrif Ahmed, MD, 0.2 mg at 07/15/16 1233 .  famotidine (PEPCID) IVPB 20 mg premix, 20 mg, Intravenous, Q12H, Kimberly A Trinh, PA-C, 20 mg at 07/20/16 1056 .  fentaNYL (SUBLIMAZE) injection 100 mcg, 100 mcg, Intravenous, Q15 min PRN, Rahul P Desai, PA-C .  fentaNYL (SUBLIMAZE) injection 100 mcg, 100 mcg, Intravenous, Q2H PRN, Rahul P Desai, PA-C, 100 mcg at 07/15/16 0935 .  heparin injection 5,000 Units, 5,000 Units, Subcutaneous, Q8H, Raylene Miyamoto, MD, 5,000 Units at 07/20/16 [phone] .  hydrochlorothiazide (HYDRODIURIL) tablet 25 mg, 25 mg, Oral, Daily, Rush Farmer, MD, 25 mg at 07/20/16 1058 .  HYDROmorphone (DILAUDID) injection 1 mg, 1 mg, Intravenous, Q4H PRN, Reyne Dumas, MD .  insulin aspart (novoLOG) injection 0-15 Units, 0-15 Units, Subcutaneous, Q4H, Rahul P Desai, PA-C, 2 Units at 07/18/16 9562 .  labetalol (NORMODYNE,TRANDATE) injection 10 mg, 10 mg, Intravenous, Q4H PRN, Tasrif Ahmed, MD .  MEDLINE mouth rinse, 15 mL, Mouth Rinse, QID, Collene Gobble, MD, 15 mL at 07/19/16 2353 .  metoprolol (LOPRESSOR) injection 2-5 mg, 2-5 mg, Intravenous, Q2H PRN, Elam Dutch, MD .  nicotine (NICODERM CQ - dosed in mg/24 hr) patch 7 mg, 7 mg, Transdermal, Daily, Karmen Bongo, MD, 7 mg at 07/20/16 1058 .  ondansetron (ZOFRAN) tablet 4 mg, 4 mg, Oral, Q6H PRN **OR** ondansetron (ZOFRAN) injection 4 mg, 4 mg, Intravenous, Q6H PRN, Karmen Bongo, MD, 4 mg at 07/15/16 1041 .   oxyCODONE (Oxy IR/ROXICODONE) immediate release tablet 10 mg, 10 mg, Oral, Q4H PRN, Reyne Dumas, MD .  oxyCODONE (OXYCONTIN) 12 hr tablet 15 mg, 15 mg, Oral, Q12H, Reyne Dumas, MD, 15 mg at 07/20/16 1058 .  saccharomyces boulardii (FLORASTOR) capsule 250 mg, 250 mg, Oral, BID, Reyne Dumas, MD, 250 mg at 07/20/16 1058  Patients Current Diet: DIET SOFT Room service appropriate? Yes; Fluid consistency: Thin Diet - low sodium heart healthy  Precautions / Restrictions Precautions Precautions: Fall Restrictions Weight Bearing Restrictions: No   Has the patient had 2 or  more falls or a fall with injury in the past year?No  Prior Activity Level Limited Community (1-2x/wk): Pt. states she does not drive but goes out 2-3 times per week with family members for grocery shopping, MD appointments and to the pharmacy.  Home Assistive Devices / Equipment Home Assistive Devices/Equipment: Wheelchair  Prior Device Use: Indicate devices/aids used by the patient prior to current illness, exacerbation or injury? small based quad cane  Prior Functional Level Prior Function Level of Independence: Needs assistance Gait / Transfers Assistance Needed: pt uses Wellsite geologist ADL's / Homemaking Assistance Needed: pt has A with all homemaking tasks and family are present when she showers, but she performs on her own.    Self Care: Did the patient need help bathing, dressing, using the toilet or eating?  Needed some help with getting into and out of the shower and washing her back  Indoor Mobility: Did the patient need assistance with walking from room to room (with or without device)? Independent  Stairs: Did the patient need assistance with internal or external stairs (with or without device)? Needed some help; pt. Describes that she needed supervision on steps   Functional Cognition: Did the patient need help planning regular tasks such as shopping or remembering to take medications? Independent  Current  Functional Level Cognition  Overall Cognitive Status: Within Functional Limits for tasks assessed Orientation Level: Oriented X4    Extremity Assessment (includes Sensation/Coordination)  Upper Extremity Assessment: LUE deficits/detail LUE Deficits / Details: LUE hemiparesis from previsou CVA. Able to use as functional assist  Lower Extremity Assessment: Generalized weakness, LLE deficits/detail LLE Deficits / Details: Edema throughout extremity, sensation appears intact, knee and hip limited extension due to guarding from pain.  Strength overall limited post op by pain.   LLE: Unable to fully assess due to pain LLE Coordination: decreased fine motor, decreased gross motor    ADLs  Overall ADL's : Needs assistance/impaired Grooming: Set up, Sitting Upper Body Bathing: Set up, Sitting Lower Body Bathing: Moderate assistance, Sit to/from stand Upper Body Dressing : Set up, Sitting Lower Body Dressing: Moderate assistance, Sit to/from stand Toilet Transfer: Moderate assistance, Stand-pivot Toileting- Clothing Manipulation and Hygiene: Min guard Functional mobility during ADLs: Moderate assistance General ADL Comments: Pt states she feels that she is functioning at @ "50%" of her baseline    Mobility  Overal bed mobility: Needs Assistance Bed Mobility: Supine to Sit, Sit to Supine Supine to sit: Min assist Sit to supine: Min assist General bed mobility comments: EOB on arrival    Transfers  Overall transfer level: Needs assistance Equipment used: Rolling walker (2 wheeled) Transfers: Sit to/from Stand Sit to Stand: Min assist General transfer comment: cues for hand placement, safety and sequence x 2 trials    Ambulation / Gait / Stairs / Wheelchair Mobility  Ambulation/Gait Ambulation/Gait assistance: Museum/gallery curator (Feet): 25 Feet Assistive device: Rolling walker (2 wheeled) Gait Pattern/deviations: Step-to pattern, Decreased stance time - left General Gait  Details: cues for posture, position in RW, off weighting LLE with arms, chair to follow. Pt walked 20' then 25' after seated rest Gait velocity interpretation: Below normal speed for age/gender    Posture / Balance Balance Overall balance assessment: Needs assistance Sitting-balance support: Bilateral upper extremity supported, Single extremity supported, Feet supported Sitting balance-Leahy Scale: Good Standing balance support: Bilateral upper extremity supported, During functional activity Standing balance-Leahy Scale: Poor    Special needs/care consideration BiPAP/CPAP  no CPM   no Continuous Drip IV  no Dialysis  no        Life Vest   no Oxygen  no Special Bed   no Trach Size   no Wound Vac (area)   no       Skin   Pt. With healing surgical incisions at medical and distal knee and bilateral groins; pt. With edema in left foot and dark second toe on left foot with loss of toe nail                              Bowel mgmt:last BM 07/20/16, incontinent Bladder mgmt: continent Diabetic mgmt   Yes     Previous Home Environment Living Arrangements: Other relatives Available Help at Discharge: Available 24 hours/day Home Care Services: No Additional Comments: pt indicates her home is accessible since she had her CVA.    Discharge Living Setting Plans for Discharge Living Setting: Patient's home Type of Home at Discharge: House Discharge Home Layout: One level Discharge Home Access: Stairs to enter Entrance Stairs-Rails: Right, Left Entrance Stairs-Number of Steps: 4-5 Discharge Bathroom Shower/Tub: Walk-in shower Discharge Bathroom Toilet: Handicapped height Discharge Bathroom Accessibility: Yes How Accessible: Accessible via walker Does the patient have any problems obtaining your medications?: No  Social/Family/Support Systems Patient Roles: Other (Comment) (pt. is close with her niece, nephew, cousin, sister, brother) Anticipated Caregiver: Feliz Beam,  niece Anticipated Caregiver's Contact Information: [phone] Ability/Limitations of Caregiver: Feliz Beam works daytime.  Pt. resides in a home with her brother, her nephew and her sister.  Moste family members work various times but pt. will  have 24/7 assist withfamily's staggered work schedules.  Pt's cousin comes around frequently.   Caregiver Availability: 24/7 Discharge Plan Discussed with Primary Caregiver: Yes (discussed with Feliz Beam) Is Caregiver In Agreement with Plan?: Yes Does Caregiver/Family have Issues with Lodging/Transportation while Pt is in Rehab?: No   Goals/Additional Needs Patient/Family Goal for Rehab: supervision and min assist PT/OT, n/a SLP Expected length of stay: 12-16 days Cultural Considerations: n/a Dietary Needs: soft diet, thin liquids Equipment Needs: TBA Additional Information: Pt. states she was on CIR approximately 2004 following her CVA Pt/Family Agrees to Admission and willing to participate: Yes Program Orientation Provided & Reviewed with Pt/Caregiver Including Roles  & Responsibilities: Yes   Decrease burden of Care through IP rehab admission: n/a   Possible need for SNF placement upon discharge:   Not anticipated   Patient Condition: This patient's condition remains as documented in the consult dated 07/19/16, in which the Rehabilitation Physician determined and documented that the patient's condition is appropriate for intensive rehabilitative care in an inpatient rehabilitation facility. Will admit to inpatient rehab today.   Preadmission Screen Completed By:  [name], 07/20/2016 12:24 PM ______________________________________________________________________   Discussed status with Dr.  Posey Pronto on 07/20/16 at  1221  and received telephone approval for admission today.  Admission Coordinator:  [name], time 12[address]umbling 07/20/16

## 2016-07-20 NOTE — Progress Notes (Signed)
I received insurance authorization from [facility]  for an [facility] admission.  I have medical clearance from Dr. Cruzita Lederer to admit pt. today.  Pt. Is agreeable and pleased.  I have updated Loanne Drilling, RNCM as well as Vincente Liberty, RN of the plan.  Pt. will admit to CIR later today.  Please call if questions.  [facility] Admissions Coordinator Cell [phone] Office [phone]

## 2016-07-20 NOTE — Progress Notes (Signed)
Wendy Lorie Phenix, MD Physician Signed Physical Medicine and Rehabilitation  Consult Note Date of Service: 07/19/2016 5:57 AM  Related encounter: ED to Hosp-Admission (Current) from 07/05/2016 in [facility] All Collapse All   [] Hide copied text [] Hover for attribution information      Physical Medicine and Rehabilitation Consult Reason for Consult: Debilitation after redo aortobifemoral bypass graft and left femoral popliteal Referring Physician: [facility]   HPI: Wendy Townsend is a 65 y.o. right handed female with history of CVA maintained on aspirin, hypertension, tobacco abuse, PVD with necrotic toe with multiple revascularization procedures. Per chart review patient lives with family and assistance as needed. Used a cane prior to admission. One level home. Presented 07/05/2016 with nonhealing wound of her second left toe after she splashed grease on it in early September. She had been initially treated with doxycycline later switched to Levaquin after wound culture showed gram-negative rods and moderate GPC in pairs.. Aortogram imaging revealed thrombosed left femoral-popliteal bypass due to left iliac arterial occlusion and right external iliac artery occlusion. Underwent preoperative clearance by cardiology services. Echocardiogram with ejection fraction of 68% grade 1 diastolic dysfunction as well as preoperative cardiac catheterization showing mild to moderate nonobstructive CAD. Normal left ventricular filling pressure. No high-grade stenosis to prevent proceeding with urgent vascular surgery. Underwent aortobifemoral bypass as well as redo left femoral exposure left femoral-popliteal bypass 07/14/2016 per Dr. Bridgett Larsson. HIDA scan normal. Hospital course pain management. Subcutaneous heparin for DVT prophylaxis. Acute surgical blood loss anemia received 2 units packed red blood cells. Intermittent bouts of diarrhea some abdominal pain. Clostridium  difficile negative. Total bilirubin 4.2 and ultrasound of the abdomen is pending. Physical and occupational therapy evaluations completed 07/18/2016 with recommendations of physical medicine rehabilitation consult.   Review of Systems  Constitutional: Negative for chills and fever.  HENT: Negative for hearing loss.   Eyes: Negative for blurred vision and double vision.  Respiratory: Positive for cough and shortness of breath.   Cardiovascular: Positive for leg swelling. Negative for chest pain and palpitations.  Gastrointestinal: Positive for constipation. Negative for nausea and vomiting.  Genitourinary: Negative for dysuria and hematuria.  Musculoskeletal: Positive for myalgias.  Skin: Negative for rash.  Neurological: Positive for weakness. Negative for seizures and headaches.  All other systems reviewed and are negative.      Past Medical History:  Diagnosis Date  . Hyperlipidemia   . Hypertension   . Peripheral vascular disease ([place])    vein surgery, left leg  . Stroke [facility]) 2002   ongoing mild loss of feeling in left side, had to learn to write right-handed  . Tobacco abuse         Past Surgical History:  Procedure Laterality Date  . AORTA - BILATERAL FEMORAL ARTERY BYPASS GRAFT N/A 07/14/2016   Procedure: AORTOBIFEMORAL BYPASS GRAFT;  Surgeon: Waynetta Sandy, MD;  Location: [place];  Service: Vascular;  Laterality: N/A;  . CARDIAC CATHETERIZATION N/A 07/11/2016   Procedure: Left Heart Cath and Coronary Angiography;  Surgeon: Nelva Bush, MD;  Location: [place] [facility];  Service: Cardiovascular;  Laterality: N/A;  . FEMORAL-POPLITEAL BYPASS GRAFT Left 07/14/2016   Procedure: REDO BYPASS GRAFT FEMORAL-POPLITEAL ARTERY;  Surgeon: Waynetta Sandy, MD;  Location: [place];  Service: Vascular;  Laterality: Left;  . PERIPHERAL VASCULAR CATHETERIZATION N/A 07/08/2016   Procedure: Abdominal Aortogram w/ Bilateral Lower Extremity Runoff;  Surgeon:  Elam Dutch, MD;  Location: [place] [facility];  Service: Cardiovascular;  Laterality: N/A;  . VEIN SURGERY Left         Family History  Problem Relation Age of Onset  . Hypertension Mother 4   Social History:  reports that she has been smoking Cigarettes.  She has smoked for the past 25.00 years. She has never used smokeless tobacco. She reports that she drinks alcohol. She reports that she uses drugs, including Marijuana. Allergies:       Allergies  Allergen Reactions  . Penicillins Swelling    Has patient had a PCN reaction causing immediate rash, facial/tongue/throat swelling, SOB or lightheadedness with hypotension: YES Has patient had a PCN reaction causing severe rash involving mucus membranes or skin necrosis: NO Has patient had a PCN reaction that required hospitalization NO Has patient had a PCN reaction occurring within the last 10 years: NO If all of the above answers are "NO", then may proceed with Cephalosporin use.         Medications Prior to Admission  Medication Sig Dispense Refill  . aspirin EC 81 MG tablet TAKE 1 TABLET (325 MG TOTAL) BY MOUTH DAILY. 30 tablet 11  . cloNIDine (CATAPRES) 0.2 MG tablet Take 1 tablet (0.2 mg total) by mouth 2 (two) times daily. 60 tablet 11  . hydrochlorothiazide (HYDRODIURIL) 25 MG tablet Take 0.5 tablets (12.5 mg total) by mouth daily. (Patient taking differently: Take 12.5 mg by mouth every other day. ) 15 tablet 11  . lisinopril (PRINIVIL,ZESTRIL) 40 MG tablet Take 1 tablet (40 mg total) by mouth daily. 30 tablet 11  . potassium chloride (MICRO-K) 10 MEQ CR capsule TAKE 1 TABLET (10 MEQ TOTAL) BY MOUTH DAILY. 31 capsule 11  . saccharomyces boulardii (FLORASTOR) 250 MG capsule Take 1 capsule (250 mg total) by mouth 2 (two) times daily. 60 capsule 0  . simvastatin (ZOCOR) 40 MG tablet TAKE 1 TABLET (40 MG TOTAL) BY MOUTH AT BEDTIME. 30 tablet 11    Home: Home Living Family/patient expects to be discharged to::  Inpatient rehab Living Arrangements: Other relatives Additional Comments: pt indicates her home is accessible since she had her CVA.    Functional History: Prior Function Level of Independence: Needs assistance Gait / Transfers Assistance Needed: pt uses [employer] ADL's / Homemaking Assistance Needed: pt has A with all homemaking tasks and family are present when she showers, but she performs on her own.   Functional Status:  Mobility: Bed Mobility Overal bed mobility: Needs Assistance Bed Mobility: Supine to Sit, Sit to Supine Supine to sit: Max assist Sit to supine: Mod assist General bed mobility comments: A with bringing LEs to EOB and bringing trunk up to sitting.  pt only needed A for LEs with return to bed.  pt indicates abdominal incision limits her ability.   Transfers Overall transfer level: Needs assistance Equipment used: Rolling walker (2 wheeled) Transfers: Sit to/from Stand Sit to Stand: Max assist General transfer comment: cues for UE use and scooting closer to EOB for coming to standing.  pt needs heavy power up to standing and A to keep pt over midline as she tends to lean hard to the R.   Ambulation/Gait Ambulation/Gait assistance: Mod assist Ambulation Distance (Feet): 3 Feet (forward and back) Assistive device: Rolling walker (2 wheeled) Gait Pattern/deviations: Step-to pattern, Decreased step length - left, Decreased stance time - left, Trunk flexed General Gait Details: pt with minimal weightbearing on L LE and seems generally weak in Bil UEs, which limit her use of  RW.  cues for more upright posture.      ADL:    Cognition: Cognition Overall Cognitive Status: Within Functional Limits for tasks assessed Orientation Level: Oriented X4 Cognition Arousal/Alertness: Awake/alert Behavior During Therapy: WFL for tasks assessed/performed Overall Cognitive Status: Within Functional Limits for tasks assessed  Blood pressure (!) (P) 143/76, pulse (P) 88,  temperature (P) 98.4 F (36.9 C), temperature source (P) Oral, resp. rate (P) 18, height 5\' 4"  (1.626 m), weight 65 kg (143 lb 4.8 oz), SpO2 (P) 100 %. Physical Exam  Vitals reviewed. Constitutional: She is oriented to person, place, and time. She appears well-developed and well-nourished.  65 year old right-handed female.  HENT:  Head: Normocephalic and atraumatic.  Poor dentition  Eyes: EOM are normal. Scleral icterus (Mild) is present.  Neck: Normal range of motion. Neck supple. No thyromegaly present.  Cardiovascular: Normal rate and regular rhythm.   PVCs  Respiratory: Effort normal and breath sounds normal. No respiratory distress.  GI: Soft. Bowel sounds are normal. She exhibits no distension.  Musculoskeletal: She exhibits edema and tenderness.  Neurological: She is alert and oriented to person, place, and time.  Follow simple commands Motor: B/l UE: 4/5 proximal to distal RLE: Hip flexion, knee extension 3/5, ankle dorsi/plantar flexion 4/5 LLE: Hip flexion, knee extension 2/5, ankle dorsi/plantar flexion 3/5  Skin:  Revascularization sites clean and dry LLE scaly with vascular changes, dry gangrene 2nd digit  Psychiatric: She has a normal mood and affect. Her behavior is normal.    Lab Results Last 24 Hours       Results for orders placed or performed during the hospital encounter of 07/05/16 (from the past 24 hour(s))  Glucose, capillary     Status: Abnormal   Collection Time: 07/18/16  8:32 AM  Result Value Ref Range   Glucose-Capillary 122 (H) 65 - 99 mg/dL   Comment 1 Notify RN    Comment 2 Document in Chart   Magnesium     Status: Abnormal   Collection Time: 07/18/16  8:40 AM  Result Value Ref Range   Magnesium 1.4 (L) 1.7 - 2.4 mg/dL  Phosphorus     Status: Abnormal   Collection Time: 07/18/16  8:40 AM  Result Value Ref Range   Phosphorus 1.7 (L) 2.5 - 4.6 mg/dL  CBC     Status: Abnormal   Collection Time: 07/18/16  8:40 AM  Result Value Ref  Range   WBC 13.4 (H) 4.0 - 10.5 K/uL   RBC 3.56 (L) 3.87 - 5.11 MIL/uL   Hemoglobin 10.1 (L) 12.0 - 15.0 g/dL   HCT 30.7 (L) 36.0 - 46.0 %   MCV 86.2 78.0 - 100.0 fL   MCH 28.4 26.0 - 34.0 pg   MCHC 32.9 30.0 - 36.0 g/dL   RDW 16.8 (H) 11.5 - 15.5 %   Platelets 159 150 - 400 K/uL  Comprehensive metabolic panel     Status: Abnormal   Collection Time: 07/18/16  8:40 AM  Result Value Ref Range   Sodium 140 135 - 145 mmol/L   Potassium 3.7 3.5 - 5.1 mmol/L   Chloride 110 101 - 111 mmol/L   CO2 22 22 - 32 mmol/L   Glucose, Bld 115 (H) 65 - 99 mg/dL   BUN 9 6 - 20 mg/dL   Creatinine, Ser 0.91 0.44 - 1.00 mg/dL   Calcium 8.2 (L) 8.9 - 10.3 mg/dL   Total Protein 5.2 (L) 6.5 - 8.1 g/dL   Albumin 2.4 (L) 3.5 -  5.0 g/dL   AST 72 (H) 15 - 41 U/L   ALT 49 14 - 54 U/L   Alkaline Phosphatase 93 38 - 126 U/L   Total Bilirubin 4.2 (H) 0.3 - 1.2 mg/dL   GFR calc non Af Amer >60 >60 mL/min   GFR calc Af Amer >60 >60 mL/min   Anion gap 8 5 - 15  Lipase, blood     Status: None   Collection Time: 07/18/16  8:40 AM  Result Value Ref Range   Lipase 31 11 - 51 U/L  Glucose, capillary     Status: Abnormal   Collection Time: 07/18/16 11:22 AM  Result Value Ref Range   Glucose-Capillary 102 (H) 65 - 99 mg/dL   Comment 1 Notify RN    Comment 2 Document in Chart   Glucose, capillary     Status: Abnormal   Collection Time: 07/18/16  4:48 PM  Result Value Ref Range   Glucose-Capillary 103 (H) 65 - 99 mg/dL  Glucose, capillary     Status: Abnormal   Collection Time: 07/18/16  8:34 PM  Result Value Ref Range   Glucose-Capillary 106 (H) 65 - 99 mg/dL     Imaging Results (Last 48 hours)  No results found.    Assessment/Plan: Diagnosis: Debilitation after redo aortobifemoral bypass graft and left femoral popliteal Labs and images independently reviewed.  Records reviewed and summated above.  1. Does the need for close, 24 hr/day medical supervision in  concert with the patient's rehab needs make it unreasonable for this patient to be served in a less intensive setting? Yes  2. Co-Morbidities requiring supervision/potential complications: history of CVA (cont meds), HTN (monitor and provide prns in accordance with increased physical exertion and pain), tobacco abuse (counsel), PVD with multiple revascularization procedures (cont meds), diastolic dysfunction (monitor for fluid overload), nonobstructive CAD (cont meds), post-op pain management (Biofeedback training with therapies to help reduce reliance on opiate pain medications, monitor pain control during therapies, and sedation at rest and titrate to maximum efficacy to ensure participation and gains in therapies), Acute blood loss anemia (transfuse further if necessary to ensure appropriate perfusion for increased activity tolerance), diarrhea (monitor fluid status), hypophosphatemia (supplement), hypomagnesemia (supplement), hypoalbuminemia (maximize nutrition for overall health and wound healing), leukocytosis (cont to monitor for signs and symptoms of infection, further workup if indicated) 3. Due to bladder management, bowel management, safety, skin/wound care, disease management, pain management and patient education, does the patient require 24 hr/day rehab nursing? Yes 4. Does the patient require coordinated care of a physician, rehab nurse, PT (1-2 hrs/day, 5 days/week) and OT (1-2 hrs/day, 5 days/week) to address physical and functional deficits in the context of the above medical diagnosis(es)? Yes Addressing deficits in the following areas: balance, endurance, locomotion, strength, transferring, bathing, dressing, toileting and psychosocial support 5. Can the patient actively participate in an intensive therapy program of at least 3 hrs of therapy per day at least 5 days per week? Yes 6. The potential for patient to make measurable gains while on inpatient rehab is excellent 7. Anticipated  functional outcomes upon discharge from inpatient rehab are supervision and min assist  with PT, supervision and min assist with OT, n/a with SLP. 8. Estimated rehab length of stay to reach the above functional goals is: 12-16 days. 9. Does the patient have adequate social supports and living environment to accommodate these discharge functional goals? Yes 10. Anticipated D/C setting: Home 11. Anticipated post D/C treatments: HH therapy and Home excercise  program 12. Overall Rehab/Functional Prognosis: good  RECOMMENDATIONS: This patient's condition is appropriate for continued rehabilitative care in the following setting: CIR when medically appropriate  ultrasound of the abdomen is pending. Patient has agreed to participate in recommended program. Yes Note that insurance prior authorization may be required for reimbursement for recommended care.  Comment: Rehab Admissions Coordinator to follow up.  Delice Lesch, MD, [facility] 07/19/2016    Revision History                        Routing History

## 2016-07-20 NOTE — Progress Notes (Signed)
Wendy Townsend [facility] Admission Coordinator Signed Physical Medicine and Rehabilitation  PMR Pre-admission Date of Service: 07/20/2016 11:59 AM  Related encounter: ED to Hosp-Admission (Current) from 07/05/2016 in [place]       [] Hide copied text PMR Admission Coordinator Pre-Admission Assessment  Patient: Wendy Townsend is an 65 y.o., female MRN: [id number] DOB: [birth year] Height: 5\' 4"  (162.6 cm) Weight: 80.1 kg (176 lb 8 oz)                                                                                                                                                  Insurance Information HMO: yes, Gold Plus HMO    PPO:      PCP:      IPA:      80/20:      OTHER:  PRIMARY: [employer] ([place])      Policy#:  [id number]      Subscriber:  self CM Name:  Margreta Journey      Phone#:  [phone]     Fax#:  [phone] Pre-Cert#:  [id number] , authorized for 7 days from 07/20/16-07/26/16; updates will be done via EMR access     Employer:  Not employed Benefits:  Phone #:  [phone]     Name:  Summer Eff. Date:  09/20/15     Deduct:  $0      Out of Pocket Max:  $5900      Life Max:  n/a CIR:  $295 days 1-6; $0 days 7 and beyond      SNF:  $0 days 1-20; $160 days 21-100 Outpatient:  $40 per visit     Co-Pay:  Home Health:  100%      Co-Pay:  DME:  80%     Co-Pay:  20% Providers:  In network SECONDARY:       Policy#:       Subscriber:  CM Name:       Phone#:      Fax#:  Pre-Cert#:       Employer:  Benefits:  Phone #:      Name:  Eff. Date:      Deduct:       Out of Pocket Max:       Life Max:  CIR:       SNF:  Outpatient:      Co-Pay:  Home Health:       Co-Pay:  DME:      Co-Pay:   Medicaid Application Date:       Case Manager:  Disability Application Date:       Case Worker:   Emergency Contact Information        Contact Information    Name Relation Home Work Mobile   Harper,Yashekia Niece [phone]       Current Medical  History  Patient  Admitting Diagnosis: Debilitation after redo aortobifemoral bypass graft and left femoral popliteal History of Present Illness: Wendy Crenshawis a 65 y.o.right handed femalewith history of CVA maintained on aspirin receiving inpatient rehabilitation services 2012, hypertension, tobacco abuse, PVD with necrotic toe with multiple revascularization procedures.Per chart review patient lives with family and assistance as needed. Used a cane prior to admission. One level home.Presented10/17/2017 with nonhealing wound of her second left toe after she splashed grease on it in early September. She had been initially treated with doxycycline later switched to Levaquin after wound culture showed gram-negative rods and moderate GPC in pairs.. Aortogram imaging revealed thrombosed left femoral-popliteal bypass due to left iliac arterial occlusion and right external iliac artery occlusion. Underwent preoperative clearance by cardiology services. Echocardiogram with ejection fraction of 16% grade 1 diastolic dysfunction as well as preoperative cardiac catheterization showing mild to moderate nonobstructive CAD. Normal left ventricular filling pressure. No high-grade stenosis to prevent proceeding with urgent vascular surgery. Underwent aortobifemoral bypass as well as redo left femoral exposure left femoral-popliteal bypass 07/14/2016 per Dr. Bridgett Larsson. Hospital course pain management. Subcutaneous heparin for DVT prophylaxis. Acute surgical blood loss anemia received 2 units packed red blood cells. Intermittent bouts of diarrhea some abdominal pain. Clostridium difficile negative. Total bilirubin 4.2 Hidascan was normal .Physical and occupational therapy evaluations completed 07/18/2016 with recommendations of physical medicine rehabilitation consult.Patient was admitted for a comprehensive rehabilitation program       Past Medical History      Past Medical History:  Diagnosis Date  .  Hyperlipidemia   . Hypertension   . Peripheral vascular disease ([place])    vein surgery, left leg  . Stroke [facility]) 2002   ongoing mild loss of feeling in left side, had to learn to write right-handed  . Tobacco abuse     Family History  family history includes Hypertension (age of onset: 64) in her mother.  Prior Rehab/Hospitalizations:  Has the patient had major surgery during 100 days prior to admission? No  Current Medications   Current Facility-Administered Medications:  .  acetaminophen (TYLENOL) tablet 650 mg, 650 mg, Oral, Q6H PRN, 650 mg at 07/20/16 0610 **OR** acetaminophen (TYLENOL) suppository 650 mg, 650 mg, Rectal, Q6H PRN, Karmen Bongo, MD .  atorvastatin (LIPITOR) tablet 80 mg, 80 mg, Per Tube, q1800, Rahul P Desai, PA-C, 80 mg at 07/19/16 1738 .  carvedilol (COREG) tablet 3.125 mg, 3.125 mg, Oral, BID WC, Reyne Dumas, MD, 3.125 mg at 07/20/16 0836 .  cloNIDine (CATAPRES - Dosed in mg/24 hr) patch 0.2 mg, 0.2 mg, Transdermal, Weekly, Tasrif Ahmed, MD, 0.2 mg at 07/15/16 1233 .  famotidine (PEPCID) IVPB 20 mg premix, 20 mg, Intravenous, Q12H, Kimberly A Trinh, PA-C, 20 mg at 07/20/16 1056 .  fentaNYL (SUBLIMAZE) injection 100 mcg, 100 mcg, Intravenous, Q15 min PRN, Rahul P Desai, PA-C .  fentaNYL (SUBLIMAZE) injection 100 mcg, 100 mcg, Intravenous, Q2H PRN, Rahul P Desai, PA-C, 100 mcg at 07/15/16 0935 .  heparin injection 5,000 Units, 5,000 Units, Subcutaneous, Q8H, Raylene Miyamoto, MD, 5,000 Units at 07/20/16 [phone] .  hydrochlorothiazide (HYDRODIURIL) tablet 25 mg, 25 mg, Oral, Daily, Rush Farmer, MD, 25 mg at 07/20/16 1058 .  HYDROmorphone (DILAUDID) injection 1 mg, 1 mg, Intravenous, Q4H PRN, Reyne Dumas, MD .  insulin aspart (novoLOG) injection 0-15 Units, 0-15 Units, Subcutaneous, Q4H, Rahul P Desai, PA-C, 2 Units at 07/18/16 0454 .  labetalol (NORMODYNE,TRANDATE) injection 10 mg, 10 mg, Intravenous, Q4H PRN, Dellia Nims, MD .  MEDLINE mouth  rinse,  15 mL, Mouth Rinse, QID, Collene Gobble, MD, 15 mL at 07/19/16 2353 .  metoprolol (LOPRESSOR) injection 2-5 mg, 2-5 mg, Intravenous, Q2H PRN, Elam Dutch, MD .  nicotine (NICODERM CQ - dosed in mg/24 hr) patch 7 mg, 7 mg, Transdermal, Daily, Karmen Bongo, MD, 7 mg at 07/20/16 1058 .  ondansetron (ZOFRAN) tablet 4 mg, 4 mg, Oral, Q6H PRN **OR** ondansetron (ZOFRAN) injection 4 mg, 4 mg, Intravenous, Q6H PRN, Karmen Bongo, MD, 4 mg at 07/15/16 1041 .  oxyCODONE (Oxy IR/ROXICODONE) immediate release tablet 10 mg, 10 mg, Oral, Q4H PRN, Reyne Dumas, MD .  oxyCODONE (OXYCONTIN) 12 hr tablet 15 mg, 15 mg, Oral, Q12H, Reyne Dumas, MD, 15 mg at 07/20/16 1058 .  saccharomyces boulardii (FLORASTOR) capsule 250 mg, 250 mg, Oral, BID, Reyne Dumas, MD, 250 mg at 07/20/16 1058  Patients Current Diet: DIET SOFT Room service appropriate? Yes; Fluid consistency: Thin Diet - low sodium heart healthy  Precautions / Restrictions Precautions Precautions: Fall Restrictions Weight Bearing Restrictions: No   Has the patient had 2 or more falls or a fall with injury in the past year?No  Prior Activity Level Limited Community (1-2x/wk): Pt. states she does not drive but goes out 2-3 times per week with family members for grocery shopping, MD appointments and to the pharmacy.  Home Assistive Devices / Equipment Home Assistive Devices/Equipment: Wheelchair  Prior Device Use: Indicate devices/aids used by the patient prior to current illness, exacerbation or injury? small based quad cane  Prior Functional Level Prior Function Level of Independence: Needs assistance Gait / Transfers Assistance Needed: pt uses Wellsite geologist ADL's / Homemaking Assistance Needed: pt has A with all homemaking tasks and family are present when she showers, but she performs on her own.    Self Care: Did the patient need help bathing, dressing, using the toilet or eating?  Needed some help with getting into and out of  the shower and washing her back  Indoor Mobility: Did the patient need assistance with walking from room to room (with or without device)? Independent  Stairs: Did the patient need assistance with internal or external stairs (with or without device)? Needed some help; pt. Describes that she needed supervision on steps   Functional Cognition: Did the patient need help planning regular tasks such as shopping or remembering to take medications? Independent  Current Functional Level Cognition Overall Cognitive Status: Within Functional Limits for tasks assessed Orientation Level: Oriented X4    Extremity Assessment (includes Sensation/Coordination) Upper Extremity Assessment: LUE deficits/detail LUE Deficits / Details: LUE hemiparesis from previsou CVA. Able to use as functional assist  Lower Extremity Assessment: Generalized weakness, LLE deficits/detail LLE Deficits / Details: Edema throughout extremity, sensation appears intact, knee and hip limited extension due to guarding from pain.  Strength overall limited post op by pain.   LLE: Unable to fully assess due to pain LLE Coordination: decreased fine motor, decreased gross motor   ADLs Overall ADL's : Needs assistance/impaired Grooming: Set up, Sitting Upper Body Bathing: Set up, Sitting Lower Body Bathing: Moderate assistance, Sit to/from stand Upper Body Dressing : Set up, Sitting Lower Body Dressing: Moderate assistance, Sit to/from stand Toilet Transfer: Moderate assistance, Stand-pivot Toileting- Clothing Manipulation and Hygiene: Min guard Functional mobility during ADLs: Moderate assistance General ADL Comments: Pt states she feels that she is functioning at @ "50%" of her baseline   Mobility Overal bed mobility: Needs Assistance Bed Mobility: Supine to Sit, Sit to Supine Supine to sit:  Min assist Sit to supine: Min assist General bed mobility comments: EOB on arrival   Transfers Overall transfer level: Needs  assistance Equipment used: Rolling walker (2 wheeled) Transfers: Sit to/from Stand Sit to Stand: Min assist General transfer comment: cues for hand placement, safety and sequence x 2 trials   Ambulation / Gait / Stairs / Wheelchair Mobility Ambulation/Gait Ambulation/Gait assistance: Museum/gallery curator (Feet): 25 Feet Assistive device: Rolling walker (2 wheeled) Gait Pattern/deviations: Step-to pattern, Decreased stance time - left General Gait Details: cues for posture, position in RW, off weighting LLE with arms, chair to follow. Pt walked 20' then 25' after seated rest Gait velocity interpretation: Below normal speed for age/gender   Posture / Balance Balance Overall balance assessment: Needs assistance Sitting-balance support: Bilateral upper extremity supported, Single extremity supported, Feet supported Sitting balance-Leahy Scale: Good Standing balance support: Bilateral upper extremity supported, During functional activity Standing balance-Leahy Scale: Poor   Special needs/care consideration BiPAP/CPAP  no CPM   no Continuous Drip IV   no Dialysis  no        Life Vest   no Oxygen  no Special Bed   no Trach Size   no Wound Vac (area)   no       Skin   Pt. With healing surgical incisions at medical and distal knee and bilateral groins; pt. With edema in left foot and dark second toe on left foot with loss of toe nail                              Bowel mgmt:last BM 07/20/16, incontinent Bladder mgmt: continent Diabetic mgmt   Yes    Previous Home Environment Living Arrangements: Other relatives Available Help at Discharge: Available 24 hours/day Home Care Services: No Additional Comments: pt indicates her home is accessible since she had her CVA.    Discharge Living Setting Plans for Discharge Living Setting: Patient's home Type of Home at Discharge: House Discharge Home Layout: One level Discharge Home Access: Stairs to enter Entrance Stairs-Rails:  Right, Left Entrance Stairs-Number of Steps: 4-5 Discharge Bathroom Shower/Tub: Walk-in shower Discharge Bathroom Toilet: Handicapped height Discharge Bathroom Accessibility: Yes How Accessible: Accessible via walker Does the patient have any problems obtaining your medications?: No  Social/Family/Support Systems Patient Roles: Other (Comment) (pt. is close with her niece, nephew, cousin, sister, brother) Anticipated Caregiver: Feliz Beam, niece Anticipated Caregiver's Contact Information: [phone] Ability/Limitations of Caregiver: Feliz Beam works daytime.  Pt. resides in a home with her brother, her nephew and her sister.  Moste family members work various times but pt. will  have 24/7 assist withfamily's staggered work schedules.  Pt's cousin comes around frequently.   Caregiver Availability: 24/7 Discharge Plan Discussed with Primary Caregiver: Yes (discussed with Feliz Beam) Is Caregiver In Agreement with Plan?: Yes Does Caregiver/Family have Issues with Lodging/Transportation while Pt is in Rehab?: No   Goals/Additional Needs Patient/Family Goal for Rehab: supervision and min assist PT/OT, n/a SLP Expected length of stay: 12-16 days Cultural Considerations: n/a Dietary Needs: soft diet, thin liquids Equipment Needs: TBA Additional Information: Pt. states she was on CIR approximately 2004 following her CVA Pt/Family Agrees to Admission and willing to participate: Yes Program Orientation Provided & Reviewed with Pt/Caregiver Including Roles  & Responsibilities: Yes   Decrease burden of Care through IP rehab admission: n/a   Possible need for SNF placement upon discharge:   Not anticipated   Patient Condition: This  patient's condition remains as documented in the consult dated 07/19/16, in which the Rehabilitation Physician determined and documented that the patient's condition is appropriate for intensive rehabilitative care in an inpatient  rehabilitation facility. Will admit to inpatient rehab today.   Preadmission Screen Completed By:  Wendy Townsend, 07/20/2016 12:24 PM ______________________________________________________________________   Discussed status with Dr.  Posey Pronto on 07/20/16 at  1221  and received telephone approval for admission today.  Admission Coordinator:  Wendy Townsend, time 1221 Sudie Grumbling 07/20/16       Cosigned by: Ankit Lorie Phenix, MD at 07/20/2016 12:32 PM  Revision History

## 2016-07-20 NOTE — Care Management Note (Signed)
Case Management Note  Patient Details  Name: Wendy Townsend MRN: [id number] Date of Birth: 12-17-1950  Subjective/Objective:   Pt admitted with dry gangrene/ishemic foot                 Action/Plan: PTA pt lived at home with brother, nephew and occasionally niece- pt needs bypass plan for OR on this admission- CM to follow post op for d/c needs  Expected Discharge Date:     07/20/16             Expected Discharge Plan:  [place]  In-House Referral:  Clinical Social Work  Discharge planning Services  CM Consult  Post Acute Care Choice:    Choice offered to:     DME Arranged:    DME Agency:     HH Arranged:    [place] Agency:     Status of Service:  Completed, signed off  If discussed at [employer], dates discussed:   Discharge Disposition: IP rehab    Additional Comments:  07/20/16- Marvetta Gibbons RN, CM- have spoken with Manuela Schwartz at [facility]- has insurance approval and bed available for CIR today- plan to d/c pt later today to [facility] IP rehab.   07/19/16- Marvetta Gibbons RN, CM- pt ready for d/c- CIR has been consulted and will start insurance approval- may not have bed today but hopeful to admit tomorrow if auth received - will f/u in am.   Dawayne Patricia, RN 07/20/2016, 11:20 PM

## 2016-07-20 NOTE — Progress Notes (Signed)
[facility]  Subjective  - Doing better.  Tolerated small amounts of food.   Objective 139/70 92 98.3 F (36.8 C) (Oral) 18 100%  Intake/Output Summary (Last 24 hours) at 07/20/16 0809 Last data filed at 07/19/16 2352  Gross per 24 hour  Intake               50 ml  Output                0 ml  Net               50 ml  Hida scan:  FINDINGS: Prompt uptake and biliary excretion of activity by the liver is seen. Gallbladder activity is visualized, consistent with patency of cystic duct. Biliary activity passes into small bowel, consistent with patent common bile duct.  65 y.o.femaleis s/p  1. Aortobifemoral bypass with 14 x 36mm dacron in end to side fashion 2. Left profunda endarterectomy 3. Left femoral to below knee popliteal artery bypass with 84mm propaten via redo incisions 4 Days Post-Op   Feet are warm and well perfused Groins soft without hematoma or drainage  Abdomin with positive BS, soft.  Assessment/Planning: POD # 65 y.o.femaleis s/p  1. Aortobifemoral bypass with 14 x 22mm dacron in end to side fashion 2. Left profunda endarterectomy 3. Left femoral to below knee popliteal artery bypass with 32mm propaten via redo incisions 4 Days Post-Op  Pending CIR Advance diet as tolerates Continued decrease WBC Continue mobility    IMPRESSION: Normal exam.  Theda Sers, Rolonda Pontarelli MAUREEN 07/20/2016 8:09 AM --  Laboratory Lab Results:  Recent Labs  07/18/16 0840 07/20/16 0420  WBC 13.4* 12.8*  HGB 10.1* 10.2*  HCT 30.7* 30.0*  PLT 159 214   BMET  Recent Labs  07/18/16 0840 07/19/16 1043  NA 140 138  K 3.7 3.4*  CL 110 103  CO2 22 25  GLUCOSE 115* 96  BUN 9 6  CREATININE 0.91 0.95  CALCIUM 8.2* 8.3*    COAG Lab Results  Component Value Date   INR 1.43 07/14/2016   INR 1.15 07/07/2016   INR 1.07 07/05/2016   No results found for: PTT

## 2016-07-20 NOTE — Progress Notes (Signed)
Patient seen this morning, stable, no changes, OK to discharge as per Dr. Allyson Sabal d/c summary 10/31. D/w [name] Rehab admission coordinator  Annisten Manchester M. Cruzita Lederer, MD [facility] [phone]

## 2016-07-21 ENCOUNTER — Inpatient Hospital Stay (HOSPITAL_COMMUNITY): Payer: Commercial Managed Care - HMO | Admitting: Occupational Therapy

## 2016-07-21 ENCOUNTER — Inpatient Hospital Stay (HOSPITAL_COMMUNITY): Payer: Commercial Managed Care - HMO | Admitting: Physical Therapy

## 2016-07-21 DIAGNOSIS — R17 Unspecified jaundice: Secondary | ICD-10-CM

## 2016-07-21 DIAGNOSIS — I70262 Atherosclerosis of native arteries of extremities with gangrene, left leg: Secondary | ICD-10-CM

## 2016-07-21 DIAGNOSIS — D6489 Other specified anemias: Secondary | ICD-10-CM

## 2016-07-21 LAB — GLUCOSE, CAPILLARY
GLUCOSE-CAPILLARY: 91 mg/dL (ref 65–99)
GLUCOSE-CAPILLARY: 116 mg/dL — AB (ref 65–99)
Glucose-Capillary: 101 mg/dL — ABNORMAL HIGH (ref 65–99)
Glucose-Capillary: 113 mg/dL — ABNORMAL HIGH (ref 65–99)

## 2016-07-21 MED ORDER — BOOST / RESOURCE BREEZE PO LIQD
1.0000 | Freq: Three times a day (TID) | ORAL | Status: DC
  Administered 2016-07-21 – 2016-07-26 (×15): 1 via ORAL

## 2016-07-21 MED ORDER — POTASSIUM CHLORIDE CRYS ER 20 MEQ PO TBCR
20.0000 meq | EXTENDED_RELEASE_TABLET | Freq: Every day | ORAL | Status: DC
  Administered 2016-07-21 – 2016-07-24 (×4): 20 meq via ORAL
  Filled 2016-07-21 (×4): qty 1

## 2016-07-21 MED ORDER — PRO-STAT SUGAR FREE PO LIQD
30.0000 mL | Freq: Two times a day (BID) | ORAL | Status: DC
  Administered 2016-07-21 – 2016-07-24 (×7): 30 mL via ORAL
  Administered 2016-07-25: 09:00:00 via ORAL
  Administered 2016-07-25 – 2016-07-27 (×4): 30 mL via ORAL
  Filled 2016-07-21 (×12): qty 30

## 2016-07-21 NOTE — Progress Notes (Signed)
Occupational Therapy Session Note  Patient Details  Name: Wendy Townsend MRN: [id number] Date of Birth: 08/20/1951  Today's Date: 07/21/2016 OT Individual Time: 1430-1536 OT Individual Time Calculation (min): 66 min     Short Term Goals: Week 1:  OT Short Term Goal 1 (Week 1): Pt will complete bathing with min assist at sit > stand level OT Short Term Goal 2 (Week 1): Pt will complete LB dressing with min assist at sit > stand level OT Short Term Goal 3 (Week 1): Pt will complete toilet transfers with min assist with use of LRAD OT Short Term Goal 4 (Week 1): Pt will completed 2 grooming tasks in standing with min assist for increased activity tolerance  Skilled Therapeutic Interventions/Progress Updates:    Treatment session with focus on functional transfers and activity tolerance.  Pt received in bed reporting fatigue but willing to participate in treatment session.  Stand pivot transfers bed > w/c > toilet min assist.  Pt required mod assist transfer back from toilet due to lower surface and decreased UE support.  Educated on walk-in shower transfers with use of RW, stepping over ledge backward with cues for correct foot sequencing to increase safety.  Pt demonstrated shower transfer using simulated shower/"blue box" with mod assist during transition for UE support on RW and then with sit > stand.  Engaged in Wii bowling and Wii dance with focus on sit <> stand and standing tolerance.  Pt tolerated standing for 1-1.5 mins before requesting seated rest break.  Stood x4 with similar amounts of time in standing before requiring rest break.  Returned to room and transferred back to bed with min assist stand pivot.    Therapy Documentation Precautions:  Precautions Precautions: Fall Restrictions Weight Bearing Restrictions: No General:   Vital Signs: Therapy Vitals Temp: 98.1 F (36.7 C) Temp Source: Oral Pulse Rate: 75 Resp: 18 BP: 121/70 Patient Position (if appropriate):  Lying Oxygen Therapy SpO2: 100 % O2 Device: Not Delivered Pain:  Pt with no c/o pain  See Function Navigator for Current Functional Status.   Therapy/Group: Individual Therapy  Simonne Come 07/21/2016, 3:37 PM

## 2016-07-21 NOTE — Evaluation (Signed)
Occupational Therapy Assessment and Plan  Patient Details  Name: Wendy Townsend MRN: [id number] Date of Birth: [birth year]  OT Diagnosis: acute pain and muscle weakness (generalized) Rehab Potential: Rehab Potential (ACUTE ONLY): Good ELOS: 10-14 days   Today's Date: 07/21/2016 OT Individual Time: 0045-9977 OT Individual Time Calculation (min): 60 min      Problem List: Patient Active Problem List   Diagnosis Date Noted  . Debilitated 07/20/2016  . Anemia of chronic disease   . Prediabetes   . Tobacco abuse   . Elevated bilirubin   . History of CVA (cerebrovascular accident)   . Benign essential HTN   . Diastolic dysfunction   . Coronary artery disease involving native coronary artery of native heart without angina pectoris   . Post-operative pain   . Acute blood loss anemia   . Diarrhea   . Hypophosphatemia   . Hypomagnesemia   . Hypoalbuminemia due to protein-calorie malnutrition ([place])   . Lymphocytosis   . Encounter for rehabilitation 07/18/2016  . Status post aortobifemoral bypass surgery   . Acute respiratory failure with hypoxia ([place])   . Acute renal failure superimposed on stage 3 chronic kidney disease ([place]) 07/13/2016  . Essential hypertension 07/13/2016  . Impaired glucose tolerance 07/13/2016  . Ischemic pain of foot, left   . Abnormal stress test   . Dyspnea   . Dry gangrene ([place]) 07/06/2016  . PVD (peripheral vascular disease) ([place]) 07/06/2016  . Hyperglycemia 07/06/2016  . Acute kidney injury superimposed on chronic kidney disease ([place]) 07/06/2016  . Protein-calorie malnutrition, severe 07/06/2016  . Ischemic pain of left foot 07/05/2016  . History of stroke 06/08/2016  . Left hemiparesis ([place]) 06/08/2016  . Numbness and tingling of left leg 06/08/2016  . Tobacco use disorder 06/08/2016  . Hypertension 06/28/2011  . Hyperlipidemia 06/28/2011  . PAD (peripheral artery disease) ([place]) 06/28/2011    Past Medical History:  Past Medical History:   Diagnosis Date  . Hyperlipidemia   . Hypertension   . Peripheral vascular disease ([employer])    vein surgery, left leg  . Stroke [facility]) 2002   ongoing mild loss of feeling in left side, had to learn to write right-handed  . Tobacco abuse    Past Surgical History:  Past Surgical History:  Procedure Laterality Date  . AORTA - BILATERAL FEMORAL ARTERY BYPASS GRAFT N/A 07/14/2016   Procedure: AORTOBIFEMORAL BYPASS GRAFT;  Surgeon: Waynetta Sandy, MD;  Location: [facility];  Service: Vascular;  Laterality: N/A;  . CARDIAC CATHETERIZATION N/A 07/11/2016   Procedure: Left Heart Cath and Coronary Angiography;  Surgeon: Nelva Bush, MD;  Location: [facility];  Service: Cardiovascular;  Laterality: N/A;  . FEMORAL-POPLITEAL BYPASS GRAFT Left 07/14/2016   Procedure: REDO BYPASS GRAFT FEMORAL-POPLITEAL ARTERY;  Surgeon: Waynetta Sandy, MD;  Location: [facility];  Service: Vascular;  Laterality: Left;  . PERIPHERAL VASCULAR CATHETERIZATION N/A 07/08/2016   Procedure: Abdominal Aortogram w/ Bilateral Lower Extremity Runoff;  Surgeon: Elam Dutch, MD;  Location: [place];  Service: Cardiovascular;  Laterality: N/A;  . VEIN SURGERY Left     Assessment & Plan Clinical Impression: Patient is a 65 y.o. right handed femalewith history of CVA maintained on aspirin receiving inpatient rehabilitation services 2012, hypertension, tobacco abuse, PVD with necrotic toe with multiple revascularization procedures.Per chart review patient lives with family and assistance as needed. Used a cane prior to admission. One level home.Presented10/17/2017 with nonhealing wound of her second left toe after she splashed grease on  it in early September. She had been initially treated with doxycycline later switched to Levaquin after wound culture showed gram-negative rods and moderate GPC in pairs. Aortogram imaging revealed thrombosed left femoral-popliteal bypass due to left iliac arterial  occlusion and right external iliac artery occlusion. Underwent preoperative clearance by cardiology services. Echocardiogram with ejection fraction of 32% grade 1 diastolic dysfunction as well as preoperative cardiac catheterization showing mild to moderate nonobstructive CAD. Normal left ventricular filling pressure. No high-grade stenosis to prevent proceeding with urgent vascular surgery. Underwent aortobifemoral bypass as well as redo left femoral exposure left femoral-popliteal bypass 07/14/2016 per Dr. Bridgett Larsson.  Hospital course pain management. Subcutaneous heparin for DVT prophylaxis. Acute surgical blood loss anemia received 2 units packed red blood cells. Intermittent bouts of diarrhea some abdominal pain. Clostridium difficile negative. Total bilirubin 4.2 Hida scan was normal .Physical and occupational therapy evaluations completed with recommendations of physical medicine rehabilitation consult.    Patient transferred to CIR on 07/20/2016 .    Patient currently requires mod with basic self-care skills secondary to muscle weakness, decreased cardiorespiratoy endurance and decreased standing balance and decreased postural control.  Prior to hospitalization, patient could complete ADLs with supervision - modified independent.  Patient will benefit from skilled intervention to decrease level of assist with basic self-care skills and increase independence with basic self-care skills prior to discharge home with care partner.  Anticipate patient will require intermittent supervision and follow up home health.  OT - End of Session Activity Tolerance: Tolerates 30+ min activity with multiple rests Endurance Deficit: Yes Endurance Deficit Description: activity limited due to pain OT Assessment Rehab Potential (ACUTE ONLY): Good OT Patient demonstrates impairments in the following area(s): Balance;Endurance;Motor;Pain;Safety OT Basic ADL's Functional Problem(s): Grooming;Bathing;Dressing;Toileting OT  Advanced ADL's Functional Problem(s): Simple Meal Preparation OT Transfers Functional Problem(s): Toilet;Tub/Shower OT Plan OT Intensity: Minimum of 1-2 x/day, 45 to 90 minutes OT Frequency: 5 out of 7 days OT Duration/Estimated Length of Stay: 10-14 days OT Treatment/Interventions: Balance/vestibular training;Discharge planning;DME/adaptive equipment instruction;Disease mangement/prevention;Functional mobility training;Pain management;Patient/family education;Psychosocial support;Self Care/advanced ADL retraining;Skin care/wound managment;Therapeutic Activities;Therapeutic Exercise;UE/LE Strength taining/ROM OT Self Feeding Anticipated Outcome(s): no goal established OT Basic Self-Care Anticipated Outcome(s): Supervision OT Toileting Anticipated Outcome(s): Mod I OT Bathroom Transfers Anticipated Outcome(s): Supervision OT Recommendation Patient destination: Home Follow Up Recommendations: 24 hour supervision/assistance;Home health OT Equipment Recommended: To be determined   Skilled Therapeutic Intervention OT eval completed with discussion of rehab process, OT purpose, POC, goals, and ELOS.  ADL assessment completed with bathing at sit > stand level from sink as MD not cleared for shower.  Max assist bed mobility and sit > stand from EOB without railings, pt reports pain in stomach impacting twisting for bed mobility and scooting to EOB.  Ambulated approx 10 feet with RW and min-mod assist.  Utilized w/c for remainder of distance to toilet.  Pt completed toilet transfer with mod assist, able to complete hygiene in sitting and then max assist when returning to w/c due to lower surface without arm rests.  Pt completed bathing at seated level with lateral leans to wash buttocks.  Assist provided for sit > stand with min tactile cues for weight shifting and support when returning to sitting for control.  Required assist for donning Lt sock, underwear and pant leg due to pain in LLE.  Left seated at  sink to complete oral care.  Notified RN of positioning and need for pain meds.  OT Evaluation Precautions/Restrictions  Precautions Precautions: Fall General   Vital Signs  Therapy Vitals Temp: 98 F (36.7 C) Temp Source: Oral Pulse Rate: 84 Resp: 18 BP: 122/65 Patient Position (if appropriate): Lying Oxygen Therapy SpO2: 98 % O2 Device: Not Delivered Pain Pain Assessment Pain Assessment: 0-10 Pain Score: 8  Pain Type: Acute pain Pain Location: Abdomen Pain Descriptors / Indicators: Aching Pain Intervention(s): RN made aware;Emotional support Home Living/Prior Functioning Home Living Available Help at Discharge: Available 24 hours/day Type of Home: House Home Access: Stairs to enter Technical brewer of Steps: 4-5 Entrance Stairs-Rails: Can reach both Home Layout: One level Bathroom Shower/Tub: Walk-in shower (has grab bars in shower and shower seat) Bathroom Toilet: Handicapped height Additional Comments: pt indicates her home is accessible since she had her CVA.    Lives With: Family (lives with sister, brother, and nephew.  Cousin is also available to assist) IADL History Homemaking Responsibilities: No Prior Function Level of Independence: Requires assistive device for independence, Needs assistance with ADLs Bath: Supervision/set-up (for shower transfers)  Able to Take Stairs?: Yes (reports with supervision) Driving: No (but would go out 2-3 times with family) ADL  See Function Navigator Vision/Perception  Vision- History Baseline Vision/History: Wears glasses Wears Glasses: Reading only Patient Visual Report: No change from baseline Vision- Assessment Vision Assessment?: No apparent visual deficits  Cognition Overall Cognitive Status: Within Functional Limits for tasks assessed Arousal/Alertness: Awake/alert Orientation Level: Person;Place;Situation Person: Oriented Place: Oriented Situation: Oriented Year: 2017 Month: November Day of Week:  Correct Memory: Appears intact Immediate Memory Recall: Sock;Blue;Bed Memory Recall: Sock;Blue;Bed Memory Recall Sock: Without Cue Memory Recall Blue: Without Cue Memory Recall Bed: Without Cue Attention: Selective Selective Attention: Appears intact Awareness: Appears intact Safety/Judgment: Appears intact Sensation Sensation Light Touch: Appears Intact (mild hypersensitivity in Lt foot) Stereognosis: Not tested Hot/Cold: Appears Intact Proprioception: Appears Intact Coordination Fine Motor Movements are Fluid and Coordinated: Yes Motor    Mobility  Bed Mobility Bed Mobility: Supine to Sit Supine to Sit: 2: Max assist;With rails;HOB flat Transfers Transfers: Sit to Stand;Stand to Sit Sit to Stand: 2: Max assist Stand to Sit: 3: Mod assist;To toilet  Extremity/Trunk Assessment RUE Assessment RUE Assessment: Within Functional Limits (strength grossly 4/5) LUE Assessment LUE Assessment: Within Functional Limits (strength grossly 4/5)   See Function Navigator for Current Functional Status.   Refer to Care Plan for Long Term Goals  Recommendations for other services: None  Discharge Criteria: Patient will be discharged from OT if patient refuses treatment 3 consecutive times without medical reason, if treatment goals not met, if there is a change in medical status, if patient makes no progress towards goals or if patient is discharged from hospital.  The above assessment, treatment plan, treatment alternatives and goals were discussed and mutually agreed upon: by patient  Simonne Come 07/21/2016, 8:41 AM

## 2016-07-21 NOTE — Progress Notes (Signed)
Social Work Assessment and Plan Social Work Assessment and Plan  Patient Details  Name: Wendy Townsend MRN: [id number] Date of Birth: [birth year]  Today's Date: 07/21/2016  Problem List:  Patient Active Problem List   Diagnosis Date Noted  . Debilitated 07/20/2016  . Anemia of chronic disease   . Prediabetes   . Tobacco abuse   . Elevated bilirubin   . History of CVA (cerebrovascular accident)   . Benign essential HTN   . Diastolic dysfunction   . Coronary artery disease involving native coronary artery of native heart without angina pectoris   . Post-operative pain   . Acute blood loss anemia   . Diarrhea   . Hypophosphatemia   . Hypomagnesemia   . Hypoalbuminemia due to protein-calorie malnutrition ([place])   . Lymphocytosis   . Encounter for rehabilitation 07/18/2016  . Status post aortobifemoral bypass surgery   . Acute respiratory failure with hypoxia ([place])   . Acute renal failure superimposed on stage 3 chronic kidney disease ([place]) 07/13/2016  . Essential hypertension 07/13/2016  . Impaired glucose tolerance 07/13/2016  . Ischemic pain of foot, left   . Abnormal stress test   . Dyspnea   . Dry gangrene ([place]) 07/06/2016  . PVD (peripheral vascular disease) ([place]) 07/06/2016  . Hyperglycemia 07/06/2016  . Acute kidney injury superimposed on chronic kidney disease ([place]) 07/06/2016  . Protein-calorie malnutrition, severe 07/06/2016  . Ischemic pain of left foot 07/05/2016  . History of stroke 06/08/2016  . Left hemiparesis ([place]) 06/08/2016  . Numbness and tingling of left leg 06/08/2016  . Tobacco use disorder 06/08/2016  . Hypertension 06/28/2011  . Hyperlipidemia 06/28/2011  . PAD (peripheral artery disease) ([place]) 06/28/2011   Past Medical History:  Past Medical History:  Diagnosis Date  . Hyperlipidemia   . Hypertension   . Peripheral vascular disease ([place])    vein surgery, left leg  . Stroke [facility]) 2002   ongoing mild loss of feeling in left side, had to  learn to write right-handed  . Tobacco abuse    Past Surgical History:  Past Surgical History:  Procedure Laterality Date  . AORTA - BILATERAL FEMORAL ARTERY BYPASS GRAFT N/A 07/14/2016   Procedure: AORTOBIFEMORAL BYPASS GRAFT;  Surgeon: Waynetta Sandy, MD;  Location: [facility];  Service: Vascular;  Laterality: N/A;  . CARDIAC CATHETERIZATION N/A 07/11/2016   Procedure: Left Heart Cath and Coronary Angiography;  Surgeon: Nelva Bush, MD;  Location: [place] [facility];  Service: Cardiovascular;  Laterality: N/A;  . FEMORAL-POPLITEAL BYPASS GRAFT Left 07/14/2016   Procedure: REDO BYPASS GRAFT FEMORAL-POPLITEAL ARTERY;  Surgeon: Waynetta Sandy, MD;  Location: [facility];  Service: Vascular;  Laterality: Left;  . PERIPHERAL VASCULAR CATHETERIZATION N/A 07/08/2016   Procedure: Abdominal Aortogram w/ Bilateral Lower Extremity Runoff;  Surgeon: Elam Dutch, MD;  Location: [place] [facility];  Service: Cardiovascular;  Laterality: N/A;  . VEIN SURGERY Left    Social History:  reports that she has been smoking Cigarettes.  She has smoked for the past 25.00 years. She has never used smokeless tobacco. She reports that she drinks alcohol. She reports that she uses drugs, including Marijuana.  Family / Support Systems Marital Status: Widow/Widower Patient Roles: Other (Comment) (God parent) Other Supports: Wendy Townsend-niece [phone]-cell Anticipated Caregiver: niece and other family members Ability/Limitations of Caregiver: Many of her family members work and can stagger their schedules, to provide assist Caregiver Availability: 24/7 Family Dynamics: Close family-they all help one another. Pt has needed care beofre  and they steped up to assist her. She lives with sister, brother, nephew and cousin. Between all of them pt will have care.  Social History Preferred language: English Religion: Baptist Cultural Background: No issues Education: High School Read: Yes Write:  Yes Employment Status: Disabled Date Retired/Disabled/Unemployed: 2008 after stroke Freight forwarder Issues: No issues Guardian/Conservator: none-according to MD pt is capable of making her own decisions while here   Abuse/Neglect Physical Abuse: Denies Verbal Abuse: Denies Sexual Abuse: Denies Exploitation of patient/patient's resources: Denies Self-Neglect: Denies  Emotional Status Pt's affect, behavior adn adjustment status: Pt is wanting to get back to her independent self, she has always been able to do for herself and does not want to burden her family members. She has had to have help with home management but this is acceptable to her. Recent Psychosocial Issues: other health issues-weakness on left side form her past stroke. Pyschiatric History: No history deferred depression screen due to coping appropriately and able to verbalize her concerns. She may benefit from neuro-psych will discuss with team and make referral. Substance Abuse History: Tobacco and Marijuana aware both not good for her vascular disease. She is aware of the resources available to her and will pursue if needed.  Patient / Family Perceptions, Expectations & Goals Pt/Family understanding of illness & functional limitations: Pt and sister are able to explain her surgery and treatment plan. Someone is usually here with pt which gives her comfort. She feels she can ask the medical team any question she has.  Premorbid pt/family roles/activities: sibling, cousin, godmother, aunt, friend, etc Anticipated changes in roles/activities/participation: resume Pt/family expectations/goals: Pt states: " I want to move aorund on my own before I leave here."  Sister states: " We will help her, but know she wants to do it on her own."  [employer]: None Premorbid Home Care/DME Agencies: Other (Comment) (had in past) Transportation available at discharge: family Resource referrals  recommended: Support group (specify)  Discharge Planning Living Arrangements: Other relatives Support Systems: Other relatives, Friends/neighbors, Social worker community Type of Residence: Private residence Insurance Resources: Multimedia programmer (specify) ([employer] Medicare) Financial Resources: SSD Living Expenses: Lives with family Money Management: Patient Does the patient have any problems obtaining your medications?: No Home Management: All of them pitch in together Patient/Family Preliminary Plans: Return home with family who between their varied schedules can provide almost 24 hr care. Pt doesn't feel this will be needed, she feels she will be able to walk with a walker or cane like prior to admission.  Social Work Anticipated Follow Up Needs: HH/OP, Support Group  Clinical Impression: Very pleasant female who is used to be independent and wanting to accomplish this while here. She has a very supportive and involved family who is usually here to provide emotional support who will assist At discharge. Pt is doing well and will probably be a short length of stay. Will await team's evaluations and work on any discharge needs. Pt may benefit from seeing neuro-psych while here, check with therapy team.   Elease Hashimoto 07/21/2016, 1:31 PM

## 2016-07-21 NOTE — Evaluation (Signed)
Physical Therapy Assessment and Plan  Patient Details  Name: Wendy Townsend MRN: [id number] Date of Birth: 1951-09-19  PT Diagnosis: Abnormal posture, Difficulty walking and Pain in LLE Rehab Potential: Good ELOS: 7-10 days   Today's Date: 07/21/2016 PT Individual Time: 4098-1191 PT Individual Time Calculation (min): 54 min     Problem List: Patient Active Problem List   Diagnosis Date Noted  . Debilitated 07/20/2016  . Anemia of chronic disease   . Prediabetes   . Tobacco abuse   . Elevated bilirubin   . History of CVA (cerebrovascular accident)   . Benign essential HTN   . Diastolic dysfunction   . Coronary artery disease involving native coronary artery of native heart without angina pectoris   . Post-operative pain   . Acute blood loss anemia   . Diarrhea   . Hypophosphatemia   . Hypomagnesemia   . Hypoalbuminemia due to protein-calorie malnutrition ([place])   . Lymphocytosis   . Encounter for rehabilitation 07/18/2016  . Status post aortobifemoral bypass surgery   . Acute respiratory failure with hypoxia ([place])   . Acute renal failure superimposed on stage 3 chronic kidney disease ([place]) 07/13/2016  . Essential hypertension 07/13/2016  . Impaired glucose tolerance 07/13/2016  . Ischemic pain of foot, left   . Abnormal stress test   . Dyspnea   . Dry gangrene ([place]) 07/06/2016  . PVD (peripheral vascular disease) ([place]) 07/06/2016  . Hyperglycemia 07/06/2016  . Acute kidney injury superimposed on chronic kidney disease ([place]) 07/06/2016  . Protein-calorie malnutrition, severe 07/06/2016  . Ischemic pain of left foot 07/05/2016  . History of stroke 06/08/2016  . Left hemiparesis ([place]) 06/08/2016  . Numbness and tingling of left leg 06/08/2016  . Tobacco use disorder 06/08/2016  . Hypertension 06/28/2011  . Hyperlipidemia 06/28/2011  . PAD (peripheral artery disease) ([place]) 06/28/2011    Past Medical History:  Past Medical History:  Diagnosis Date  . Hyperlipidemia    . Hypertension   . Peripheral vascular disease ([place])    vein surgery, left leg  . Stroke [facility]) 2002   ongoing mild loss of feeling in left side, had to learn to write right-handed  . Tobacco abuse    Past Surgical History:  Past Surgical History:  Procedure Laterality Date  . AORTA - BILATERAL FEMORAL ARTERY BYPASS GRAFT N/A 07/14/2016   Procedure: AORTOBIFEMORAL BYPASS GRAFT;  Surgeon: Waynetta Sandy, MD;  Location: [place];  Service: Vascular;  Laterality: N/A;  . CARDIAC CATHETERIZATION N/A 07/11/2016   Procedure: Left Heart Cath and Coronary Angiography;  Surgeon: Nelva Bush, MD;  Location: [place] [facility];  Service: Cardiovascular;  Laterality: N/A;  . FEMORAL-POPLITEAL BYPASS GRAFT Left 07/14/2016   Procedure: REDO BYPASS GRAFT FEMORAL-POPLITEAL ARTERY;  Surgeon: Waynetta Sandy, MD;  Location: [place];  Service: Vascular;  Laterality: Left;  . PERIPHERAL VASCULAR CATHETERIZATION N/A 07/08/2016   Procedure: Abdominal Aortogram w/ Bilateral Lower Extremity Runoff;  Surgeon: Elam Dutch, MD;  Location: [place] [facility];  Service: Cardiovascular;  Laterality: N/A;  . VEIN SURGERY Left     Assessment & Plan Clinical Impression: Wendy Townsend is a 65 y.o. right handed female with history of CVA maintained on aspirin receiving inpatient rehabilitation services 2012, hypertension, tobacco abuse, PVD with necrotic toe with multiple revascularization procedures. Per chart review patient lives with family and assistance as needed. Used a cane prior to admission. One level home. Presented 07/05/2016 with nonhealing wound of her second left toe after she splashed  grease on it in early September. She had been initially treated with doxycycline later switched to Levaquin after wound culture showed gram-negative rods and moderate GPC in pairs.. Aortogram imaging revealed thrombosed left femoral-popliteal bypass due to left iliac arterial occlusion and right external  iliac artery occlusion. Underwent preoperative clearance by cardiology services. Echocardiogram with ejection fraction of 05% grade 1 diastolic dysfunction as well as preoperative cardiac catheterization showing mild to moderate nonobstructive CAD. Normal left ventricular filling pressure. No high-grade stenosis to prevent proceeding with urgent vascular surgery. Underwent aortobifemoral bypass as well as redo left femoral exposure left femoral-popliteal bypass 07/14/2016 per Dr. Bridgett Larsson.  Hospital course pain management. Subcutaneous heparin for DVT prophylaxis. Acute surgical blood loss anemia received 2 units packed red blood cells. Intermittent bouts of diarrhea some abdominal pain. Clostridium difficile negative. Total bilirubin 4.2 Hida scan was normal . Physical and occupational therapy evaluations completed 07/18/2016 with recommendations of physical medicine rehabilitation consult.Patient was admitted for a comprehensive rehabilitation program.   Patient transferred to CIR on 07/20/2016 .   Patient currently requires min with mobility secondary to muscle weakness and pain and decreased cardiorespiratoy endurance.  Prior to hospitalization, patient was modified independent  with mobility and lived with Family (lives with multiple family members) in a House home.  Home access is 4Stairs to enter.  Patient will benefit from skilled PT intervention to maximize safe functional mobility, minimize fall risk and decrease caregiver burden for planned discharge home with 24 hour supervision.  Anticipate patient will benefit from follow up [place] at discharge.  PT - End of Session Activity Tolerance: Tolerates 10 - 20 min activity with multiple rests Endurance Deficit: Yes Endurance Deficit Description: activity limited due to pain and fatigue PT Assessment Rehab Potential (ACUTE/IP ONLY): Good Barriers to Discharge: Inaccessible home environment PT Patient demonstrates impairments in the following area(s):  Balance;Endurance;Pain;Safety PT Transfers Functional Problem(s): Bed Mobility;Bed to Chair;Car;Furniture PT Locomotion Functional Problem(s): Ambulation;Wheelchair Mobility;Stairs PT Plan PT Intensity: Minimum of 1-2 x/day ,45 to 90 minutes PT Frequency: 5 out of 7 days PT Duration Estimated Length of Stay: 7-10 days PT Treatment/Interventions: Ambulation/gait training;Community reintegration;DME/adaptive equipment instruction;Neuromuscular re-education;Psychosocial support;Stair training;UE/LE Strength taining/ROM;Wheelchair propulsion/positioning;UE/LE Coordination activities;Therapeutic Activities;Pain management;Balance/vestibular training;Discharge planning;Functional mobility training;Patient/family education;Splinting/orthotics;Therapeutic Exercise PT Transfers Anticipated Outcome(s): mod I PT Locomotion Anticipated Outcome(s): mod I household, supervision community and stairs PT Recommendation Follow Up Recommendations: Home health PT;24 hour supervision/assistance Patient destination: Home Equipment Recommended: To be determined  Skilled Therapeutic Intervention Pt resting in w/c on arrival, c/o 7/10 pain but declines medication reporting it makes her drowsy, agreeable to therapy session.  PT provided pt and family education regarding role of PT, plan of care, goals of therapy, safety plan, and ELOS.  PT instructed pt in transfers sit<>stand and squat/pivot with steady assist and min verbal cues for safety.  Gait training x25' with RW and steady assist with verbal cues for upright posture.  Attempted stair negotiation with 2 rails at 6" step and 3" step but pt too fearful to fully bear weight on LLE 2/2 pain and unable to negotiate a step.  Car transfer with min assist to lift LLE into car.  Pt requesting to return to room at this time, reporting fatigue and pain.  Instructed pt in w/c propulsion x10' with max assist to maintain straight path 2/2 chronic L hemiparesis.  Pt returned to room  and declining further therapy at this time.  Left sitting EOB with family in room, call bell in reach and needs met.  PT Evaluation Precautions/Restrictions Precautions Precautions: Fall Restrictions Weight Bearing Restrictions: No General PT Amount of Missed Time (min): 21 Minutes PT Missed Treatment Reason: Patient fatigue  Pain Pain Assessment Pain Assessment: 0-10 Pain Score: 7  Faces Pain Scale: Hurts a little bit Pain Type: Acute pain Pain Location: Abdomen Pain Descriptors / Indicators: Discomfort Pain Intervention(s): Medication (See eMAR) Home Living/Prior Functioning Home Living Available Help at Discharge: Available 24 hours/day Type of Home: House Home Access: Stairs to enter Technical brewer of Steps: 4 Entrance Stairs-Rails: Can reach both Home Layout: One level Additional Comments: pt states her home was made accessible follow her CVA in 2005  Lives With: Family (lives with multiple family members) Prior Function Level of Independence: Requires assistive device for independence  Able to Take Stairs?: Yes Driving: No  Cognition Overall Cognitive Status: Within Functional Limits for tasks assessed Arousal/Alertness: Awake/alert Orientation Level: Oriented X4 Sensation Sensation Light Touch: Appears Intact Stereognosis: Not tested Hot/Cold: Appears Intact Proprioception: Appears Intact Coordination Fine Motor Movements are Fluid and Coordinated: Yes  Mobility Transfers Transfers: Yes Sit to Stand: 4: Min assist Stand to Sit: 4: Min assist Locomotion  Ambulation Ambulation: Yes Ambulation/Gait Assistance: 4: Min guard Ambulation Distance (Feet): 25 Feet Assistive device: Rolling walker Gait Gait: Yes Gait Pattern: Impaired Gait Pattern: Antalgic;Decreased stance time - left;Decreased weight shift to left;Decreased stride length;Decreased step length - right;Step-to pattern;Trunk flexed Stairs / Additional Locomotion Stairs:  No Wheelchair Mobility Wheelchair Mobility: No  Trunk/Postural Assessment  Cervical Assessment Cervical Assessment: Within Functional Limits Thoracic Assessment Thoracic Assessment: Within Functional Limits Lumbar Assessment Lumbar Assessment: Within Functional Limits Postural Control Postural Control: Within Functional Limits  Extremity Assessment  RUE Assessment RUE Assessment: Within Functional Limits (strength grossly 4/5) LUE Assessment LUE Assessment: Within Functional Limits (strength grossly 4/5) RLE Assessment RLE Assessment: Within Functional Limits RLE AROM (degrees) RLE Overall AROM Comments: WFL assessed in sitting RLE Strength RLE Overall Strength Comments: 4+/5 throughout LLE Assessment LLE Assessment: Exceptions to WFL LLE AROM (degrees) LLE Overall AROM Comments: limited at hip, knee, and ankle 2/2 pain LLE PROM (degrees) LLE Overall PROM Comments: WFL, except slightly limited in knee extension 2/2 pain  LLE Strength LLE Overall Strength Comments: limited 2/2 pain   See Function Navigator for Current Functional Status.   Refer to Care Plan for Long Term Goals  Recommendations for other services: None  Discharge Criteria: Patient will be discharged from PT if patient refuses treatment 3 consecutive times without medical reason, if treatment goals not met, if there is a change in medical status, if patient makes no progress towards goals or if patient is discharged from hospital.  The above assessment, treatment plan, treatment alternatives and goals were discussed and mutually agreed upon: by patient and by family  Earnest Conroy Penven-Crew 07/21/2016, 12:29 PM

## 2016-07-21 NOTE — Progress Notes (Signed)
Patient information reviewed and entered into eRehab system by Traevion Poehler, RN, CRRN, PPS Coordinator.  Information including medical coding and functional independence measure will be reviewed and updated through discharge.     Per nursing patient was given "Data Collection Information Summary for Patients in Inpatient Rehabilitation Facilities with attached "Privacy Act Statement-Health Care Records" upon admission.  

## 2016-07-21 NOTE — Progress Notes (Signed)
65 y.o.right handed femalewith history of CVA maintained on aspirin receiving inpatient rehabilitation services 2012, hypertension, tobacco abuse, PVD with necrotic toe with multiple revascularization procedures.Per chart review patient lives with family and assistance as needed. Used a cane prior to admission. One level home.Presented10/17/2017 with nonhealing wound of her second left toe after she splashed grease on it in early September. She had been initially treated with doxycycline later switched to Levaquin after wound culture showed gram-negative rods and moderate GPC in pairs. Aortogram imaging revealed thrombosed left femoral-popliteal bypass due to left iliac arterial occlusion and right external iliac artery occlusion. Underwent preoperative clearance by cardiology services. Echocardiogram with ejection fraction of 63% grade 1 diastolic dysfunction as well as preoperative cardiac catheterization showing mild to moderate nonobstructive CAD. Normal left ventricular filling pressure. No high-grade stenosis to prevent proceeding with urgent vascular surgery. Underwent aortobifemoral bypass as well as redo left femoral exposure left femoral-popliteal bypass 07/14/2016 per Dr. Bridgett Larsson  Subjective/Complaints: Discussed with nursing, patient with abdominal and bilateral groin wounds with Dermabond, healing well. Patient denies any foot pain. Needed Doppler to find the left pedal pulse, Patient states she feels well today, no new complaints this morning. Slept okay  Review of systems negative for chest pain, shortness breath, nausea, vomiting, diarrhea, positive constipation Objective: Vital Signs: Blood pressure 122/65, pulse 84, temperature 98 F (36.7 C), temperature source Oral, resp. rate 18, height _0  (1.651 m), weight 66.7 kg (147 lb), SpO2 98 %. Nm Hepatobiliary Liver Func  Result Date: 07/19/2016 CLINICAL DATA:  Abdominal pain. EXAM: NUCLEAR MEDICINE HEPATOBILIARY IMAGING TECHNIQUE:  Sequential images of the abdomen were obtained out to 60 minutes following intravenous administration of radiopharmaceutical. RADIOPHARMACEUTICALS:  5.0 MCi Tc-3m Choletec IV COMPARISON:  KUB 07/14/2016. FINDINGS: Prompt uptake and biliary excretion of activity by the liver is seen. Gallbladder activity is visualized, consistent with patency of cystic duct. Biliary activity passes into small bowel, consistent with patent common bile duct. IMPRESSION: Normal exam. Electronically Signed   By: TMarcello Moores Register   On: 07/19/2016 10:56   Results for orders placed or performed during the hospital encounter of 07/20/16 (from the past 72 hour(s))  CBC WITH DIFFERENTIAL     Status: Abnormal   Collection Time: 07/20/16  3:20 PM  Result Value Ref Range   WBC 12.2 (H) 4.0 - 10.5 K/uL   RBC 3.74 (L) 3.87 - 5.11 MIL/uL   Hemoglobin 10.8 (L) 12.0 - 15.0 g/dL   HCT 31.4 (L) 36.0 - 46.0 %   MCV 84.0 78.0 - 100.0 fL   MCH 28.9 26.0 - 34.0 pg   MCHC 34.4 30.0 - 36.0 g/dL   RDW 16.4 (H) 11.5 - 15.5 %   Platelets 208 150 - 400 K/uL   Neutrophils Relative % 63 %   Lymphocytes Relative 25 %   Monocytes Relative 11 %   Eosinophils Relative 1 %   Basophils Relative 0 %   Neutro Abs 7.7 1.7 - 7.7 K/uL   Lymphs Abs 3.1 0.7 - 4.0 K/uL   Monocytes Absolute 1.3 (H) 0.1 - 1.0 K/uL   Eosinophils Absolute 0.1 0.0 - 0.7 K/uL   Basophils Absolute 0.0 0.0 - 0.1 K/uL   RBC Morphology POLYCHROMASIA PRESENT     Comment: TARGET CELLS  Comprehensive metabolic panel     Status: Abnormal   Collection Time: 07/20/16  3:20 PM  Result Value Ref Range   Sodium 136 135 - 145 mmol/L   Potassium 3.1 (L) 3.5 - 5.1 mmol/L  Chloride 99 (L) 101 - 111 mmol/L   CO2 26 22 - 32 mmol/L   Glucose, Bld 98 65 - 99 mg/dL   BUN 8 6 - 20 mg/dL   Creatinine, Ser 1.03 (H) 0.44 - 1.00 mg/dL   Calcium 8.3 (L) 8.9 - 10.3 mg/dL   Total Protein 5.4 (L) 6.5 - 8.1 g/dL   Albumin 2.5 (L) 3.5 - 5.0 g/dL   AST 68 (H) 15 - 41 U/L   ALT 48 14 - 54 U/L    Alkaline Phosphatase 112 38 - 126 U/L   Total Bilirubin 4.2 (H) 0.3 - 1.2 mg/dL   GFR calc non Af Amer 56 (L) >60 mL/min   GFR calc Af Amer >60 >60 mL/min    Comment: (NOTE) The eGFR has been calculated using the CKD EPI equation. This calculation has not been validated in all clinical situations. eGFR's persistently <60 mL/min signify possible Chronic Kidney Disease.    Anion gap 11 5 - 15  Glucose, capillary     Status: None   Collection Time: 07/20/16  4:44 PM  Result Value Ref Range   Glucose-Capillary 94 65 - 99 mg/dL   Comment 1 Notify RN   Glucose, capillary     Status: Abnormal   Collection Time: 07/20/16  8:48 PM  Result Value Ref Range   Glucose-Capillary 106 (H) 65 - 99 mg/dL  Glucose, capillary     Status: None   Collection Time: 07/21/16  6:55 AM  Result Value Ref Range   Glucose-Capillary 91 65 - 99 mg/dL      General: No acute distress Mood and affect are appropriate Heart: Regular rate and rhythm no rubs murmurs or extra sounds Lungs: Clear to auscultation, breathing unlabored, no rales or wheezes Abdomen: Positive bowel sounds, soft nontender to palpation, nondistended, midline abdominal incision with Dermabond. No drainage Extremities: No clubbing, cyanosis, moderate left foot dorsal edema Skin: No evidence of breakdown, no evidence of rash Neurologic: Cranial nerves II through XII intact, motor strength is 4/5 in bilateral deltoid, bicep, tricep, grip,4- hip flexor, knee extensors, ankle dorsiflexor and plantar flexor Sensory exam normal sensation to light touch and proprioception in bilateral upper and lower extremities Cerebellar exam normal finger to nose to finger as well as heel to shin in bilateral upper and lower extremities Musculoskeletal: Full range of motion in all 4 extremities. No joint swelling   Assessment/Plan: 1. Functional deficits secondary to deconditioning related to severe peripheral vascular disease requiring redo femoropopliteal  bypass which require 3+ hours per day of interdisciplinary therapy in a comprehensive inpatient rehab setting. Physiatrist is providing close team supervision and 24 hour management of active medical problems listed below. Physiatrist and rehab team continue to assess barriers to discharge/monitor patient progress toward functional and medical goals. FIM: Function - Bathing Position: Wheelchair/chair at sink Body parts bathed by patient: Right arm, Left arm, Chest, Abdomen, Front perineal area, Buttocks Body parts bathed by helper: Back Bathing not applicable: Right upper leg, Left upper leg, Right lower leg, Left lower leg Assist Level: Touching or steadying assistance(Pt > 75%)  Function- Upper Body Dressing/Undressing What is the patient wearing?: Pull over shirt/dress Pull over shirt/dress - Perfomed by patient: Thread/unthread left sleeve, Put head through opening, Pull shirt over trunk Pull over shirt/dress - Perfomed by helper: Thread/unthread right sleeve Assist Level: Touching or steadying assistance(Pt > 75%) Function - Lower Body Dressing/Undressing What is the patient wearing?: Underwear, Pants, Non-skid slipper socks Position: Wheelchair/chair at sink Underwear -  Performed by patient: Thread/unthread right underwear leg, Pull underwear up/down Underwear - Performed by helper: Thread/unthread left underwear leg Pants- Performed by patient: Thread/unthread right pants leg Pants- Performed by helper: Thread/unthread left pants leg, Pull pants up/down Non-skid slipper socks- Performed by patient: Don/doff right sock Non-skid slipper socks- Performed by helper: Don/doff left sock Assist for footwear: Partial/moderate assist Assist for lower body dressing:  (Mod assist)  Function - Toileting Toileting steps completed by patient: Performs perineal hygiene Toileting Assistive Devices: Grab bar or rail Assist level: Touching or steadying assistance (Pt.75%)  Function - Engineer, petroleum transfer assistive device: Grab bar Assist level to toilet: Moderate assist (Pt 50 - 74%/lift or lower) Assist level from toilet: Maximal assist (Pt 25 - 49%/lift and lower)  Function - Chair/bed transfer Chair/bed transfer method: Stand pivot, Ambulatory Chair/bed transfer assist level: Maximal assist (Pt 25 - 49%/lift and lower) Chair/bed transfer assistive device: Walker                   Medical Problem List and Plan: 1.  Debilitation secondary to redo aortobifemoral bypass graft and left femoral-popliteal/multi-medical Initiate PT and OT CIR level 2.  DVT Prophylaxis/Anticoagulation: Subcutaneous heparin. Monitor platelet counts of any signs of bleeding 3. Pain Management: OxyContin 15 mg every 12 hours, oxycodone immediate release as needed 4. Acute on chronic anemia. Latest hemoglobin 10.1 5. Neuropsych: This patient is capable of making decisions on her own behalf. 6. Skin/Wound Care: Routine skin checks 7. Fluids/Electrolytes/Nutrition: Routine I&O with follow-up chemistries, kidney functions are normal. However, does have low albumin 8. History of CVA. Aspirin 81 mg daily 9. Hypertension. Coreg 3.125 mg twice a day, HCTZ 25 mg daily clonidine patch 0.2 mg weekly. Monitor with increased mobility 10. Impaired glucose intolerance. Hemoglobin A1c 6.2. Continue sliding scale. 11. Tobacco abuse. Counseling. Nicoderm patch. 12. Hyperlipidemia. Lipitor 13. Elevated total bilirubin. Hida scan unremarkable. . Follow-up chemistries  14. Hypokalemia. Will recheck potassium, we'll supplement this may be related to hydrochlorothiazide  LOS (Days) 1 A FACE TO [place] E 07/21/2016, 8:37 AM

## 2016-07-21 NOTE — Progress Notes (Signed)
Initial Nutrition Assessment  DOCUMENTATION CODES:   Non-severe (moderate) malnutrition in context of chronic illness  INTERVENTION:  Provide Boost Breeze po TID, each supplement provides 250 kcal and 9 grams of protein.  Provide 30 ml Prostat po BID, each supplement provides 100 kcal and 15 grams of protein.   Provide nourishment snacks (ordered).  Encourage adequate PO intake.   NUTRITION DIAGNOSIS:   Malnutrition related to chronic illness as evidenced by moderate depletion of body fat, moderate depletions of muscle mass.  GOAL:   Patient will meet greater than or equal to 90% of their needs  MONITOR:   PO intake, Supplement acceptance, Labs, Weight trends, Skin, I & O's  REASON FOR ASSESSMENT:   Malnutrition Screening Tool    ASSESSMENT:   65 y.o. right handed female with history of CVA maintained on aspirin receiving inpatient rehabilitation services 2012, hypertension, tobacco abuse, PVD with necrotic toe with multiple revascularization procedures. Aortogram imaging revealed thrombosed left femoral-popliteal bypass due to left iliac arterial occlusion and right external iliac artery occlusion.  Underwent aortobifemoral bypass as well as redo left femoral exposure left femoral-popliteal bypass   Pt familiar to this RD from most recent acute inpatient hospitalization. Meal completion has been 5-75%. Pt reports having a decreased appetite and disliking most of her food at meals. No recent weight loss per weight records. Pt is agreeable to Boost Breeze to aid in caloric and protein needs. RD to additionally order Prostat for adequate protein. Pt encouraged to eat her food at meals. Pt additionally requests nourishment snacks. Will order.   Nutrition-Focused physical exam completed. Findings are mild to moderate fat depletion, mild to moderate muscle depletion, and moderate edema.   Labs and medications reviewed.   Diet Order:  DIET SOFT Room service appropriate? Yes; Fluid  consistency: Thin  Skin:  Wound (see comment) (wound on L toe, incision on L leg)  Last BM:  11/2  Height:   Ht Readings from Last 1 Encounters:  07/20/16 5\' 5"  (1.651 m)    Weight:   Wt Readings from Last 1 Encounters:  07/20/16 147 lb (66.7 kg)    Ideal Body Weight:  56.8 kg  BMI:  Body mass index is 24.46 kg/m.  Estimated Nutritional Needs:   Kcal:  1750-2000  Protein:  75-90 grams  Fluid:  1.8 - 2 L/day  EDUCATION NEEDS:   No education needs identified at this time  Corrin Parker, MS, RD, LDN Pager # [phone] After hours/ weekend pager # [phone]

## 2016-07-21 NOTE — Care Management Note (Signed)
Inpatient [facility] Individual Statement of Services  Patient Name:  Wendy Townsend  Date:  07/21/2016  Welcome to the [facility].  Our goal is to provide you with an individualized program based on your diagnosis and situation, designed to meet your specific needs.  With this comprehensive rehabilitation program, you will be expected to participate in at least 3 hours of rehabilitation therapies Monday-Friday, with modified therapy programming on the weekends.  Your rehabilitation program will include the following services:  Physical Therapy (PT), Occupational Therapy (OT), 24 hour per day rehabilitation nursing, Therapeutic Recreaction (TR), Case Management (Social Worker), Rehabilitation Medicine, Nutrition Services and Pharmacy Services  Weekly team conferences will be held on Wednesday to discuss your progress.  Your Social Worker will talk with you frequently to get your input and to update you on team discussions.  Team conferences with you and your family in attendance may also be held.  Expected length of stay: 8-12 days Overall anticipated outcome: mod/i-supervision level  Depending on your progress and recovery, your program may change. Your Social Worker will coordinate services and will keep you informed of any changes. Your Social Worker's name and contact numbers are listed  below.  The following services may also be recommended but are not provided by the [facility]:   [place] will be made to provide these services after discharge if needed.  Arrangements include referral to agencies that provide these services.  Your insurance has been verified to be:  [employer] Your primary doctor is:  Gildardo Cranker  Pertinent information will be shared with your doctor and your insurance company.  Social Worker:  Ovidio Kin, [place] or  ([phone]  Information discussed with and copy given to patient by: Elease Hashimoto, 07/21/2016, 12:11 PM

## 2016-07-22 ENCOUNTER — Ambulatory Visit: Payer: Commercial Managed Care - HMO | Admitting: Internal Medicine

## 2016-07-22 ENCOUNTER — Inpatient Hospital Stay (HOSPITAL_COMMUNITY): Payer: Commercial Managed Care - HMO | Admitting: Physical Therapy

## 2016-07-22 ENCOUNTER — Inpatient Hospital Stay (HOSPITAL_COMMUNITY): Payer: Commercial Managed Care - HMO | Admitting: Occupational Therapy

## 2016-07-22 LAB — GLUCOSE, CAPILLARY
GLUCOSE-CAPILLARY: 92 mg/dL (ref 65–99)
GLUCOSE-CAPILLARY: 116 mg/dL — AB (ref 65–99)
GLUCOSE-CAPILLARY: 103 mg/dL — AB (ref 65–99)
Glucose-Capillary: 107 mg/dL — ABNORMAL HIGH (ref 65–99)

## 2016-07-22 NOTE — Progress Notes (Signed)
Physical Therapy Session Note  Patient Details  Name: Wendy Townsend MRN: [id number] Date of Birth: 07/11/1951  Today's Date: 07/22/2016 PT Individual Time: 1415-1500 PT Individual Time Calculation (min): 45 min    Short Term Goals: Week 1:  PT Short Term Goal 1 (Week 1): =LTGs due to ELOS  Skilled Therapeutic Interventions/Progress Updates:   Pt resting in bed on arrival, no c/o pain, and agreeable to therapy session.  Pt transitioned supine>sit with supervision and donned socks seated EOB with increased time and set up assist.  Stand/pivot eob<>w/c with close supervision and use of bed rails.  PT instructed pt in stair negotiation x8 steps with 2 rails with supervision, pt progressing to alternating step pattern on 3" steps.  Nustep x8 minutes with BLEs/BUEs focus on LE ROM, strengthening, and activity tolerance, pt taking several short rest breaks as needed.  Pt returned to room at end of session and positioned self back in bed with min assist for LLE.  Call bell in reach and needs met.   Therapy Documentation Precautions:  Precautions Precautions: Fall Restrictions Weight Bearing Restrictions: No   See Function Navigator for Current Functional Status.   Therapy/Group: Individual Therapy  Chrystie Hagwood E Penven-Crew 07/22/2016, 3:01 PM

## 2016-07-22 NOTE — Progress Notes (Signed)
Physical Therapy Session Note  Patient Details  Name: Lesette Frary MRN: [id number] Date of Birth: December 10, 1950  Today's Date: 07/22/2016 PT Individual Time: 0900-1000 PT Individual Time Calculation (min): 60 min    Short Term Goals: Week 1:  PT Short Term Goal 1 (Week 1): =LTGs due to ELOS  Skilled Therapeutic Interventions/Progress Updates:    Pt sleeping soundly in bed on arrival, but awakes to voice, no c/o pain and agreeable to therapy session.  Session focus on dynamic standing/sitting balance for bathing/dressing from w/c at sink level with set up assist, sit<>stand transfers from EOB and w/c with supervision and no AD or RW with min cues for hand placement, gait training 4x30' with RW and supervision with seated rest breaks between each trial, and LE strengthening with 10 reps of LAQ, hip flexion, and isometric hip adduction bilaterally.  Pt returned to room at end of session and positioned upright in w/c with call bell in reach and needs met.   Therapy Documentation Precautions:  Precautions Precautions: Fall Restrictions Weight Bearing Restrictions: No   See Function Navigator for Current Functional Status.   Therapy/Group: Individual Therapy  Sanda Dejoy E Penven-Crew 07/22/2016, 10:04 AM

## 2016-07-22 NOTE — Progress Notes (Signed)
Occupational Therapy Session Note  Patient Details  Name: Wendy Townsend MRN: [id number] Date of Birth: 11-23-1950  Today's Date: 07/22/2016 OT Individual Time: 1030-1130 OT Individual Time Calculation (min): 60 min     Short Term Goals:Week 1:  OT Short Term Goal 1 (Week 1): Pt will complete bathing with min assist at sit > stand level OT Short Term Goal 2 (Week 1): Pt will complete LB dressing with min assist at sit > stand level OT Short Term Goal 3 (Week 1): Pt will complete toilet transfers with min assist with use of LRAD OT Short Term Goal 4 (Week 1): Pt will completed 2 grooming tasks in standing with min assist for increased activity tolerance  Skilled Therapeutic Interventions/Progress Updates:    Pt seen for OT session focusing on functional activity tolerance and standing balance. Pt received resting in supine in therapy gym with hand off from other OT session. Pt voiced increased fatigue this session, however, willing to attempt therapy. She desired to keep workin gon stepping up 3" step as she was doing in previous session. Alternated trials with leading LE coming into stand. Pt initially voiced liking leading with L LE, however, with practice, she began to like R LE more. Steadying assist required for all trials. Completed standing horseshoe toss game with pt using one UE for stabilizing assist. Progressed to ball toss activity with pt tossing ball using B UEs, with steadying assist provided by therapist. Pt guarding placing equal weight on L LE.  Attempted to have pt self propel w/c in hallway, however, pt not utilizing L UE to push despite max VCs and therefore pt unable to maintain straight propulsion.  Ambulated over carpeted surface in simulation of home/ community setting, however, pt fearful of getting too far from w/c and declined further ambulation. Required steadying assist for ambulation with RW. Upon return to room, pt ambulated ~10 ft w/c <> toilet. Required mod A to  stand from toilet, toileting task completed with steadying assist.  Pt returned to supine at end of session, all needs in reach, bed alarm on.   Therapy Documentation Precautions:  Precautions Precautions: Fall Restrictions Weight Bearing Restrictions: No   See Function Navigator for Current Functional Status.   Therapy/Group: Individual Therapy  Lewis, Antawn Sison C 07/22/2016, 6:49 AM

## 2016-07-22 NOTE — Progress Notes (Signed)
Occupational Therapy Session Note  Patient Details  Name: Wendy Townsend MRN: [id number] Date of Birth: 1951-09-07  Today's Date: 07/22/2016 OT Individual Time: 1005-1030 OT Individual Time Calculation (min): 25 min     Short Term Goals: Week 1:  OT Short Term Goal 1 (Week 1): Pt will complete bathing with min assist at sit > stand level OT Short Term Goal 2 (Week 1): Pt will complete LB dressing with min assist at sit > stand level OT Short Term Goal 3 (Week 1): Pt will complete toilet transfers with min assist with use of LRAD OT Short Term Goal 4 (Week 1): Pt will completed 2 grooming tasks in standing with min assist for increased activity tolerance  Skilled Therapeutic Interventions/Progress Updates:    Treatment session with focus on activity tolerance, sit <> stand, and alternating weight shift with steps.  Attempted to engage in dynamic balance activity on Bi[place], however pt unable to weight shift onto LLE to step up large step even with assistance from therapist, ?due to pain vs fearfulness vs instability.  Engaged in weight shifting and stepping up on to 3" block with pt able to complete with RLE first with mod assist and then min assist when stepping up with LLE first.  Utilize RW over block for UE support, engaged in activity x5 with min assist.  Discussed difficulty with LB dressing.  Issued elastic theraband to practice simulated LB dressing with pt eventually able to cross LLE over Rt knee and thread "pants" with increased time but no assist from therapist.  Pt left in sidelying on mat to await next therapist.  Therapy Documentation Precautions:  Precautions Precautions: Fall Restrictions Weight Bearing Restrictions: No Pain: Pain Assessment Pain Score: 0-No pain  See Function Navigator for Current Functional Status.   Therapy/Group: Individual Therapy  Simonne Come 07/22/2016, 12:17 PM

## 2016-07-23 ENCOUNTER — Inpatient Hospital Stay (HOSPITAL_COMMUNITY): Payer: Commercial Managed Care - HMO | Admitting: Physical Therapy

## 2016-07-23 ENCOUNTER — Inpatient Hospital Stay (HOSPITAL_COMMUNITY): Payer: Commercial Managed Care - HMO | Admitting: Occupational Therapy

## 2016-07-23 DIAGNOSIS — R7303 Prediabetes: Secondary | ICD-10-CM

## 2016-07-23 DIAGNOSIS — R5381 Other malaise: Principal | ICD-10-CM

## 2016-07-23 LAB — GLUCOSE, CAPILLARY
GLUCOSE-CAPILLARY: 123 mg/dL — AB (ref 65–99)
GLUCOSE-CAPILLARY: 87 mg/dL (ref 65–99)
GLUCOSE-CAPILLARY: 106 mg/dL — AB (ref 65–99)
Glucose-Capillary: 105 mg/dL — ABNORMAL HIGH (ref 65–99)

## 2016-07-23 NOTE — Progress Notes (Signed)
Physical Therapy Session Note  Patient Details  Name: Wendy Townsend MRN: [id number] Date of Birth: 02-07-1951  Today's Date: 07/23/2016 PT Individual Time: 4628-6381 PT Individual Time Calculation (min): 72 min    Short Term Goals:Week 1:  PT Short Term Goal 1 (Week 1): =LTGs due to ELOS  Skilled Therapeutic Interventions/Progress Updates:  Pt resting in w/c on arrival, no c/o pain, and agreeable to therapy session.  Session focus on strengthening, activity tolerance, and gait training.  Pt transfers throughout session sit<>stand from a variety of surfaces with supervision and RW.  Gait training on even surfaces 25'+35' with RW and supervision with verbal cues for posture.  Gait training on compliant surface x10' with RW and initial steady assist fade to supervision.  PT instructed pt in LE strengthening exercises with 2# weight on RLE only and level 3 theraband, 2x10 reps: LAQ, hip flexion, hamstring curls, and isometric hip adduction in sitting, 2x10 minisquats in standing.  Focus on abdominal activation and pt comfort with trunk rotation/flexion with activity reaching across midline and forward 2x12 each direction.  Kinetron 5 trials to fatigue at 30 cm/s for LE strengthening and overall activity tolerance.  Pt returned to room at end of session and requesting to use restroom.  Ambulation into bathroom with RW and supervision, pt performs transfers, hygiene, and clothing management with supervision and ambulates back to bed with supervision.  Sit>supine with increased time and supervision, pt positioned to comfort and left with call bell in reach and needs met.       Therapy Documentation Precautions:  Precautions Precautions: Fall Restrictions Weight Bearing Restrictions: No   See Function Navigator for Current Functional Status.   Therapy/Group: Individual Therapy  Krystin Keeven E Penven-Crew 07/23/2016, 9:02 AM

## 2016-07-23 NOTE — Progress Notes (Signed)
Patient ID: Wendy Townsend, female   DOB: 1950/10/31, 65 y.o.   MRN: [id number]   07/23/16.  65 y.o.right handed femalewith history of CVA, admitted for CIR with debilitation secondary to redo aortobifemoral bypass graft and left femoral-popliteal bypass.Presented10/17/2017 with nonhealing wound of her second left toe. Underwent aortobifemoral bypass as well as redo left femoral exposure left femoral-popliteal bypass 07/14/2016 per Dr. Bridgett Larsson.  Past Medical History:  Diagnosis Date  . Hyperlipidemia   . Hypertension   . Peripheral vascular disease ([place])    vein surgery, left leg  . Stroke [facility]) 2002   ongoing mild loss of feeling in left side, had to learn to write right-handed  . Tobacco abuse      Subjective/Complaints:  Patient states she feels well today, no new complaints this morning. Slept okay.   Review of systems negative for chest pain, shortness breath, nausea, vomiting, diarrhea, positive constipation Objective: Vital Signs: Blood pressure 140/78, pulse 91, temperature 99 F (37.2 C), temperature source Oral, resp. rate 18, height _0  (1.651 m), weight 147 lb (66.7 kg), SpO2 98 %. No results found. Results for orders placed or performed during the hospital encounter of 07/20/16 (from the past 72 hour(s))  CBC WITH DIFFERENTIAL     Status: Abnormal   Collection Time: 07/20/16  3:20 PM  Result Value Ref Range   WBC 12.2 (H) 4.0 - 10.5 K/uL   RBC 3.74 (L) 3.87 - 5.11 MIL/uL   Hemoglobin 10.8 (L) 12.0 - 15.0 g/dL   HCT 31.4 (L) 36.0 - 46.0 %   MCV 84.0 78.0 - 100.0 fL   MCH 28.9 26.0 - 34.0 pg   MCHC 34.4 30.0 - 36.0 g/dL   RDW 16.4 (H) 11.5 - 15.5 %   Platelets 208 150 - 400 K/uL   Neutrophils Relative % 63 %   Lymphocytes Relative 25 %   Monocytes Relative 11 %   Eosinophils Relative 1 %   Basophils Relative 0 %   Neutro Abs 7.7 1.7 - 7.7 K/uL   Lymphs Abs 3.1 0.7 - 4.0 K/uL   Monocytes Absolute 1.3 (H) 0.1 - 1.0 K/uL   Eosinophils Absolute 0.1 0.0 - 0.7  K/uL   Basophils Absolute 0.0 0.0 - 0.1 K/uL   RBC Morphology POLYCHROMASIA PRESENT     Comment: TARGET CELLS  Comprehensive metabolic panel     Status: Abnormal   Collection Time: 07/20/16  3:20 PM  Result Value Ref Range   Sodium 136 135 - 145 mmol/L   Potassium 3.1 (L) 3.5 - 5.1 mmol/L   Chloride 99 (L) 101 - 111 mmol/L   CO2 26 22 - 32 mmol/L   Glucose, Bld 98 65 - 99 mg/dL   BUN 8 6 - 20 mg/dL   Creatinine, Ser 1.03 (H) 0.44 - 1.00 mg/dL   Calcium 8.3 (L) 8.9 - 10.3 mg/dL   Total Protein 5.4 (L) 6.5 - 8.1 g/dL   Albumin 2.5 (L) 3.5 - 5.0 g/dL   AST 68 (H) 15 - 41 U/L   ALT 48 14 - 54 U/L   Alkaline Phosphatase 112 38 - 126 U/L   Total Bilirubin 4.2 (H) 0.3 - 1.2 mg/dL   GFR calc non Af Amer 56 (L) >60 mL/min   GFR calc Af Amer >60 >60 mL/min    Comment: (NOTE) The eGFR has been calculated using the CKD EPI equation. This calculation has not been validated in all clinical situations. eGFR's persistently <60 mL/min signify possible Chronic Kidney Disease.  Anion gap 11 5 - 15  Glucose, capillary     Status: None   Collection Time: 07/20/16  4:44 PM  Result Value Ref Range   Glucose-Capillary 94 65 - 99 mg/dL   Comment 1 Notify RN   Glucose, capillary     Status: Abnormal   Collection Time: 07/20/16  8:48 PM  Result Value Ref Range   Glucose-Capillary 106 (H) 65 - 99 mg/dL  Glucose, capillary     Status: None   Collection Time: 07/21/16  6:55 AM  Result Value Ref Range   Glucose-Capillary 91 65 - 99 mg/dL  Glucose, capillary     Status: Abnormal   Collection Time: 07/21/16 11:41 AM  Result Value Ref Range   Glucose-Capillary 116 (H) 65 - 99 mg/dL   Comment 1 Notify RN   Glucose, capillary     Status: Abnormal   Collection Time: 07/21/16  4:51 PM  Result Value Ref Range   Glucose-Capillary 101 (H) 65 - 99 mg/dL  Glucose, capillary     Status: Abnormal   Collection Time: 07/21/16  8:46 PM  Result Value Ref Range   Glucose-Capillary 113 (H) 65 - 99 mg/dL   Glucose, capillary     Status: None   Collection Time: 07/22/16  7:18 AM  Result Value Ref Range   Glucose-Capillary 92 65 - 99 mg/dL  Glucose, capillary     Status: Abnormal   Collection Time: 07/22/16 11:49 AM  Result Value Ref Range   Glucose-Capillary 116 (H) 65 - 99 mg/dL  Glucose, capillary     Status: Abnormal   Collection Time: 07/22/16  4:44 PM  Result Value Ref Range   Glucose-Capillary 103 (H) 65 - 99 mg/dL  Glucose, capillary     Status: Abnormal   Collection Time: 07/22/16 10:43 PM  Result Value Ref Range   Glucose-Capillary 107 (H) 65 - 99 mg/dL  Glucose, capillary     Status: Abnormal   Collection Time: 07/23/16  6:36 AM  Result Value Ref Range   Glucose-Capillary 105 (H) 65 - 99 mg/dL      General: No acute distress. IV line in place, right arm.  Alert, sitting in chair  Heart: Regular rate and rhythm no rubs murmurs or extra sounds Lungs: Clear to auscultation, breathing unlabored, no rales or wheezes Abdomen: Positive bowel sounds, soft nontender to palpation, nondistended, midline abdominal incision with Dermabond. No drainage Extremities: Left calf and foot edema; healing incision, left medial thigh and wound left medial ankle  Musculoskeletal: Full range of motion in all 4 extremities. No joint swelling   Medical Problem List and Plan: 1.  Debilitation secondary to redo aortobifemoral bypass graft and left femoral-popliteal/multi-medical Continue PT and OT CIR level 2.  DVT Prophylaxis/Anticoagulation: Subcutaneous heparin. Monitor platelet counts of any signs of bleeding 3. Pain Management: OxyContin 15 mg every 12 hours, oxycodone immediate release as needed 4. Acute on chronic anemia. Latest hemoglobin 10.1  5. Skin/Wound Care: Routine skin checks  6. Hypertension. Coreg 3.125 mg twice a day, HCTZ 25 mg daily clonidine patch 0.2 mg weekly. Monitor with increased mobility 7. Impaired glucose intolerance. Hemoglobin A1c 6.2. Continue sliding  scale.   8. Hypokalemia. Will recheck potassium, we'll supplement this may be related to hydrochlorothiazide  LOS (Days) 3 A FACE TO FACE EVALUATION WAS PERFORMED  Nyoka Cowden 07/23/2016, 10:17 AM

## 2016-07-23 NOTE — Progress Notes (Signed)
Occupational Therapy Session Note  Patient Details  Name: Wendy Townsend MRN: [id number] Date of Birth: [birth year]  Today's Date: 07/23/2016 OT Individual Time: 2671-2458 and 0998-3382 OT Individual Time Calculation (min): 60 min and 57 min    Short Term Goals:Week 1:  OT Short Term Goal 1 (Week 1): Pt will complete bathing with min assist at sit > stand level OT Short Term Goal 2 (Week 1): Pt will complete LB dressing with min assist at sit > stand level OT Short Term Goal 3 (Week 1): Pt will complete toilet transfers with min assist with use of LRAD OT Short Term Goal 4 (Week 1): Pt will completed 2 grooming tasks in standing with min assist for increased activity tolerance  Skilled Therapeutic Interventions/Progress Updates:    1) Treatment session with focus on functional mobility, sit <> stand, and activity tolerance during self-care tasks.  Pt in bed asleep upon arrival but easily aroused.  Pt declined ambulating to toilet due to just waking up.  Completed stand pivot transfer bed > w/c with min guard and then min assist w/c > toilet.  Pt required tactile cues to facilitate further weight shift forward for sit > stand from low toilet seat.   Bathing and dressing completed at sit > stand level at sink with only min cues for sequencing with LB dressing.  Pt with overall improved activity tolerance and independence with sit <> stand for LB dressing.  Set up for breakfast with encouragement to attempt to increase intake.  Minimal intake.  Pt completed oral care from w/c level without assistance.  Left upright in w/c with all needs in reach.  2) Treatment session with focus on activity tolerance and endurance.  Pt received in bed but willing to participate in treatment session.  Ambulated bed to w/c approx 20 feet with RW and min guard assist for stability.  Completed 11 mins on Nustep with focus on activity tolerance and strengthening with pt maintaining approx 40 step/min pace and taking  several short rests breaks as needed.  Ambulated approx 40 feet with RW min guard.  Returned to room with therapist pushing w/c and then ambulated 20 feet door back to bed.  Pt undressed and donned hospital gown with setup.    Therapy Documentation Precautions:  Precautions Precautions: Fall Restrictions Weight Bearing Restrictions: No General:   Vital Signs: Therapy Vitals Temp: 99 F (37.2 C) Temp Source: Oral Pulse Rate: 91 Resp: 18 BP: 128/84 Patient Position (if appropriate): Lying Oxygen Therapy SpO2: 98 % O2 Device: Not Delivered Pain: Pt with no c/o pain  See Function Navigator for Current Functional Status.   Therapy/Group: Individual Therapy  Simonne Come 07/23/2016, 8:12 AM

## 2016-07-24 ENCOUNTER — Inpatient Hospital Stay (HOSPITAL_COMMUNITY): Payer: Commercial Managed Care - HMO | Admitting: Physical Therapy

## 2016-07-24 DIAGNOSIS — D638 Anemia in other chronic diseases classified elsewhere: Secondary | ICD-10-CM

## 2016-07-24 LAB — BASIC METABOLIC PANEL
Anion gap: 10 (ref 5–15)
BUN: 13 mg/dL (ref 6–20)
CALCIUM: 8.2 mg/dL — AB (ref 8.9–10.3)
CHLORIDE: 97 mmol/L — AB (ref 101–111)
CO2: 28 mmol/L (ref 22–32)
CREATININE: 1.07 mg/dL — AB (ref 0.44–1.00)
GFR calc Af Amer: >60 mL/min (ref >60)
GFR calc non Af Amer: 54 mL/min — ABNORMAL LOW (ref >60)
GLUCOSE: 90 mg/dL (ref 65–99)
Potassium: 3.4 mmol/L — ABNORMAL LOW (ref 3.5–5.1)
Sodium: 135 mmol/L (ref 135–145)

## 2016-07-24 LAB — GLUCOSE, CAPILLARY
GLUCOSE-CAPILLARY: 101 mg/dL — AB (ref 65–99)
Glucose-Capillary: 101 mg/dL — ABNORMAL HIGH (ref 65–99)
Glucose-Capillary: 107 mg/dL — ABNORMAL HIGH (ref 65–99)
Glucose-Capillary: 108 mg/dL — ABNORMAL HIGH (ref 65–99)

## 2016-07-24 MED ORDER — POTASSIUM CHLORIDE CRYS ER 10 MEQ PO TBCR
30.0000 meq | EXTENDED_RELEASE_TABLET | Freq: Every day | ORAL | Status: DC
  Administered 2016-07-25: 30 meq via ORAL
  Filled 2016-07-24: qty 1

## 2016-07-24 NOTE — Progress Notes (Signed)
LLE with pitting edema, especially to top of left foot.  Left pedal pulses obtained by dopplers. Patient Guards LLE.  Takes tylenol for pain. Doesn't want to take PRN oxy IR. Wendy Townsend A

## 2016-07-24 NOTE — Progress Notes (Signed)
Patient ID: Wendy Townsend, female   DOB: May 13, 1951, 65 y.o.   MRN: [id number]   Patient ID: Wendy Townsend, female   DOB: 04/10/1951, 65 y.o.   MRN: [id number]   07/24/16.  65 y.o.right handed femalewith history of CVA, admitted for CIR with debilitation secondary to redo aortobifemoral bypass graft and left femoral-popliteal bypass.Presented10/17/2017 with nonhealing wound of her second left toe. Underwent aortobifemoral bypass as well as redo left femoral exposure left femoral-popliteal bypass 07/14/2016 per Dr. Bridgett Larsson.  Past Medical History:  Diagnosis Date  . Hyperlipidemia   . Hypertension   . Peripheral vascular disease ([place])    vein surgery, left leg  . Stroke [facility]) 2002   ongoing mild loss of feeling in left side, had to learn to write right-handed  . Tobacco abuse      Subjective/Complaints:  Patient states she feels well today, no new complaints this morning. Slept okay. Nursing staff request increase dose frequency of  Acetaminophen due to minor discomfort.  Review of systems negative for chest pain, shortness breath, nausea, vomiting, diarrhea, positive constipation Objective: Vital Signs: Blood pressure 125/67, pulse 83, temperature 98.2 F (36.8 C), temperature source Oral, resp. rate 18, height _0  (1.651 m), weight 147 lb (66.7 kg), SpO2 100 %. No results found. Results for orders placed or performed during the hospital encounter of 07/20/16 (from the past 72 hour(s))  Glucose, capillary     Status: Abnormal   Collection Time: 07/21/16 11:41 AM  Result Value Ref Range   Glucose-Capillary 116 (H) 65 - 99 mg/dL   Comment 1 Notify RN   Glucose, capillary     Status: Abnormal   Collection Time: 07/21/16  4:51 PM  Result Value Ref Range   Glucose-Capillary 101 (H) 65 - 99 mg/dL  Glucose, capillary     Status: Abnormal   Collection Time: 07/21/16  8:46 PM  Result Value Ref Range   Glucose-Capillary 113 (H) 65 - 99 mg/dL  Glucose, capillary     Status: None   Collection Time: 07/22/16  7:18 AM  Result Value Ref Range   Glucose-Capillary 92 65 - 99 mg/dL  Glucose, capillary     Status: Abnormal   Collection Time: 07/22/16 11:49 AM  Result Value Ref Range   Glucose-Capillary 116 (H) 65 - 99 mg/dL  Glucose, capillary     Status: Abnormal   Collection Time: 07/22/16  4:44 PM  Result Value Ref Range   Glucose-Capillary 103 (H) 65 - 99 mg/dL  Glucose, capillary     Status: Abnormal   Collection Time: 07/22/16 10:43 PM  Result Value Ref Range   Glucose-Capillary 107 (H) 65 - 99 mg/dL  Glucose, capillary     Status: Abnormal   Collection Time: 07/23/16  6:36 AM  Result Value Ref Range   Glucose-Capillary 105 (H) 65 - 99 mg/dL  Glucose, capillary     Status: Abnormal   Collection Time: 07/23/16 11:45 AM  Result Value Ref Range   Glucose-Capillary 123 (H) 65 - 99 mg/dL  Glucose, capillary     Status: None   Collection Time: 07/23/16  4:37 PM  Result Value Ref Range   Glucose-Capillary 87 65 - 99 mg/dL  Glucose, capillary     Status: Abnormal   Collection Time: 07/23/16  9:03 PM  Result Value Ref Range   Glucose-Capillary 106 (H) 65 - 99 mg/dL  Basic metabolic panel     Status: Abnormal   Collection Time: 07/24/16  4:48 AM  Result Value Ref  Range   Sodium 135 135 - 145 mmol/L   Potassium 3.4 (L) 3.5 - 5.1 mmol/L   Chloride 97 (L) 101 - 111 mmol/L   CO2 28 22 - 32 mmol/L   Glucose, Bld 90 65 - 99 mg/dL   BUN 13 6 - 20 mg/dL   Creatinine, Ser 1.07 (H) 0.44 - 1.00 mg/dL   Calcium 8.2 (L) 8.9 - 10.3 mg/dL   GFR calc non Af Amer 54 (L) >60 mL/min   GFR calc Af Amer >60 >60 mL/min    Comment: (NOTE) The eGFR has been calculated using the CKD EPI equation. This calculation has not been validated in all clinical situations. eGFR's persistently <60 mL/min signify possible Chronic Kidney Disease.    Anion gap 10 5 - 15  Glucose, capillary     Status: Abnormal   Collection Time: 07/24/16  7:12 AM  Result Value Ref Range   Glucose-Capillary  101 (H) 65 - 99 mg/dL    CBG (last 3)   Recent Labs  07/23/16 1637 07/23/16 2103 07/24/16 0712  GLUCAP 87 106* 101*    BP Readings from Last 3 Encounters:  07/24/16 125/67  07/20/16 128/68  06/08/16 122/70    General: No acute distress. IV line in place, right arm.  Alert, sitting in chair  Heart: Regular rate and rhythm no rubs murmurs or extra sounds Lungs: Clear to auscultation, breathing unlabored, no rales or wheezes Abdomen: Positive bowel sounds, soft nontender to palpation, nondistended, midline abdominal incision with Dermabond. No drainage Extremities: Left calf and foot edema; healing incision, left medial thigh and chronic healed wound left medial ankle  Musculoskeletal: Full range of motion in all 4 extremities. No joint swelling   Medical Problem List and Plan: 1.  Debilitation secondary to redo aortobifemoral bypass graft and left femoral-popliteal/multi-medical Continue PT and OT CIR level 2.  DVT Prophylaxis/Anticoagulation: Subcutaneous heparin. Monitor platelet counts of any signs of bleeding 3. Pain Management: OxyContin 15 mg every 12 hours, oxycodone immediate release as needed 4. Acute on chronic anemia. Latest hemoglobin 10.1  5. Hypertension. Coreg 3.125 mg twice a day, HCTZ 25 mg daily clonidine patch 0.2 mg weekly. Monitor with increased mobility 6. Impaired glucose intolerance. Hemoglobin A1c 6.2. Continue sliding scale. 7.  History of hypokalemia.  Repeat potassium today 3.4.  We'll continue to supplement  LOS (Days) 4 A FACE TO FACE EVALUATION WAS PERFORMED  Nyoka Cowden 07/24/2016, 10:29 AM

## 2016-07-24 NOTE — Progress Notes (Addendum)
Physical Therapy Session Note  Patient Details  Name: Wendy Townsend MRN: [id number] Date of Birth: April 23, 1951  Today's Date: 07/24/2016 PT Individual Time: 1000-1104 PT Individual Time Calculation (min): 64 min    Short Term Goals: Week 1:  PT Short Term Goal 1 (Week 1): =LTGs due to ELOS  Skilled Therapeutic Interventions/Progress Updates:    Pt received in w/c & agreeable to tx. Pt noted 3/10 LLE pain with activity & intermittent abdominal pain (sharp, stabbing) but reports being premedicated. Pt required mod assist to don jacket as pt with difficulty threading BUE through sleeves. Transported pt room>gym via w/c total assist for time management. Gait training x 50 ft + 40 ft + 15 ft + 15 ft with RW & steady assist; pt demonstrates decreased weight shifting L, absent heel strike LLE, and poor foot clearance LLE. Instructed pt on increased foot clearance & heel strike with poor demo by pt. Utilized nu-step up to level 3 x 4 minutes for BLE strengthening but pt noted L foot pain & activity ceased. Pt performed BLE standing therapeutic exercises for strengthening. Exercises included BLE: hip/knee flexion and hip abduction all 2 sets x 10 reps with therapist providing verbal cuing & demonstration for proper technique. Pt with difficulty weight bearing through LLE to complete RLE exercises. Transported pt back to room in same manner as noted above & pt completed stand pivot w/c>bed with RW & steady assist. Pt doffed socks with extra time to complete task & transferred sit>supine with supervision & use of bed rails. At end of session pt left in bed with all needs within reach & bed alarm set.   Throughout session pt required frequent seated rest breaks 2/2 fatigue and minimal encouragement for continued participation.  During session pt required cuing to square up to w/c, feel it on BLE then transfer stand>sit; pt with improving demonstration with decreased cuing as session progressed.   Therapy  Documentation Precautions:  Precautions Precautions: Fall Restrictions Weight Bearing Restrictions: No  Pain: Pain Assessment Pain Assessment: 0-10 Pain Score: 3  Pain Location: Leg Pain Orientation: Left Pain Onset: With Activity Pain Intervention(s):  (pt reports being premedicated) Multiple Pain Sites: Yes 2nd Pain Site Pain Location: Abdomen Pain Descriptors / Indicators: Sharp;Stabbing Pain Frequency: Occasional Pain Intervention(s):  (pt reports being premedicated)   See Function Navigator for Current Functional Status.   Therapy/Group: Individual Therapy  Waunita Schooner 07/24/2016, 12:01 PM

## 2016-07-25 ENCOUNTER — Inpatient Hospital Stay (HOSPITAL_COMMUNITY): Payer: Commercial Managed Care - HMO | Admitting: Occupational Therapy

## 2016-07-25 ENCOUNTER — Inpatient Hospital Stay (HOSPITAL_COMMUNITY): Payer: Commercial Managed Care - HMO | Admitting: Physical Therapy

## 2016-07-25 ENCOUNTER — Inpatient Hospital Stay (HOSPITAL_COMMUNITY): Payer: Commercial Managed Care - HMO

## 2016-07-25 DIAGNOSIS — Z720 Tobacco use: Secondary | ICD-10-CM

## 2016-07-25 LAB — GLUCOSE, CAPILLARY
GLUCOSE-CAPILLARY: 103 mg/dL — AB (ref 65–99)
GLUCOSE-CAPILLARY: 95 mg/dL (ref 65–99)
Glucose-Capillary: 123 mg/dL — ABNORMAL HIGH (ref 65–99)
Glucose-Capillary: 91 mg/dL (ref 65–99)

## 2016-07-25 MED ORDER — POTASSIUM CHLORIDE CRYS ER 20 MEQ PO TBCR
40.0000 meq | EXTENDED_RELEASE_TABLET | Freq: Every day | ORAL | Status: DC
Start: 2016-07-26 — End: 2016-07-27
  Administered 2016-07-26 – 2016-07-27 (×2): 40 meq via ORAL
  Filled 2016-07-25 (×2): qty 2

## 2016-07-25 NOTE — Progress Notes (Signed)
Occupational Therapy Session Note  Patient Details  Name: Wendy Townsend MRN: [id number] Date of Birth: Apr 17, 1951  Today's Date: 07/25/2016 OT Individual Time: 8280-0349 OT Individual Time Calculation (min): 60 min     Short Term Goals: Week 1:  OT Short Term Goal 1 (Week 1): Pt will complete bathing with min assist at sit > stand level OT Short Term Goal 2 (Week 1): Pt will complete LB dressing with min assist at sit > stand level OT Short Term Goal 3 (Week 1): Pt will complete toilet transfers with min assist with use of LRAD OT Short Term Goal 4 (Week 1): Pt will completed 2 grooming tasks in standing with min assist for increased activity tolerance  Skilled Therapeutic Interventions/Progress Updates:    Treatment session with focus on functional transfers and activity tolerance during self-care tasks.  Pt received in bed asking about when she can shower.  MD not available and PA wanting to wait till MD assessed her again for healing.  Completed toilet transfer with RW and supervision.  Close supervision with sit > stand from toilet.  Bathing and dressing completed at Mod I - supervision level at sink.  Pt able to don pants and socks by crossing LLE over Rt knee with increased agility.  Performed simulated walk-in shower transfer with 3" ledge with pt requiring supervision/cues for stepping sequence over ledge but able to complete 2nd attempt without cues, just supervision.  Returned to room and left upright in w/c with all needs in reach.    Therapy Documentation Precautions:  Precautions Precautions: Fall Restrictions Weight Bearing Restrictions: No General:   Vital Signs: Therapy Vitals Temp: 98.9 F (37.2 C) Temp Source: Oral Pulse Rate: 78 Resp: 18 BP: 126/72 Patient Position (if appropriate): Lying Oxygen Therapy SpO2: 100 % O2 Device: Not Delivered Pain:   Pt with c/o pain in stomach, not rated.  Rn notified.  See Function Navigator for Current Functional  Status.   Therapy/Group: Individual Therapy  Simonne Come 07/25/2016, 8:31 AM

## 2016-07-25 NOTE — Progress Notes (Signed)
Social Work Patient ID: Wendy Townsend, female   DOB: 1951/04/15, 65 y.o.   MRN: [id number]  Team feels pt will meet her goals by Wed and feel she will be ready for discharge then. MD agreeable with This plan. Will work toward discharge Wed.

## 2016-07-25 NOTE — Progress Notes (Signed)
Occupational Therapy Session Note  Patient Details  Name: Eleora Sutherland MRN: [id number] Date of Birth: [birth year]  Today's Date: 07/25/2016 OT Individual Time: 6381-7711 OT Individual Time Calculation (min): 14 min  and Today's Date: 07/25/2016 OT Missed Time: 16 Minutes Missed Time Reason: Patient ill (comment) (dizziness and stomach ache)     Short Term Goals: Week 1:  OT Short Term Goal 1 (Week 1): Pt will complete bathing with min assist at sit > stand level OT Short Term Goal 2 (Week 1): Pt will complete LB dressing with min assist at sit > stand level OT Short Term Goal 3 (Week 1): Pt will complete toilet transfers with min assist with use of LRAD OT Short Term Goal 4 (Week 1): Pt will completed 2 grooming tasks in standing with min assist for increased activity tolerance  Skilled Therapeutic Interventions/Progress Updates:     Upon entering the room, pt supine in bed with c/o dizziness and stomach ache. Vitals taken and are WNL. Pt declined therapy this session and with maximal encouragement and education from therapist she continues to decline. Pt remained supine in bed with bed alarm activated and call bell within reach.     Therapy Documentation Precautions:  Precautions Precautions: Fall Restrictions Weight Bearing Restrictions: No General: General OT Amount of Missed Time: 16 Minutes PT Missed Treatment Reason: Patient fatigue Vital Signs: Therapy Vitals Temp: 98.7 F (37.1 C) Temp Source: Oral Pulse Rate: 68 Resp: 18 BP: 112/71 Patient Position (if appropriate): Lying Oxygen Therapy SpO2: 100 % O2 Device: Not Delivered  See Function Navigator for Current Functional Status.   Therapy/Group: Individual Therapy  Phineas Semen 07/25/2016, 2:21 PM

## 2016-07-25 NOTE — Progress Notes (Signed)
Occupational Therapy Note  Patient Details  Name: Wendy Townsend MRN: [id number] Date of Birth: Feb 05, 1951  Today's Date: 07/25/2016 OT Individual Time: 1030-1130 OT Individual Time Calculation (min): 60 min    Pt denied pain Individual Therapy  Pt resting in bed upon arrival.  Pt engaged in standing tasks while completing tasks with BUE.  Pt able to stand for 8 mins X 1 and 12 mins X 1.  Pt also engaged in reaching tasks while standing to challenge standing balance.  Pt also engaged in sit<>stand without BUE support.  Pt completed all tasks at close supervision level.   Leotis [facility]ames [facility] 07/25/2016, 12:15 PM

## 2016-07-25 NOTE — Progress Notes (Signed)
Subjective/Complaints: No problems overnite Slept well  Review of systems negative for chest pain, shortness breath, nausea, vomiting, diarrhea, positive constipation Objective: Vital Signs: Blood pressure 113/63, pulse 82, temperature 98.3 F (36.8 C), temperature source Oral, resp. rate 16, height _0  (1.651 m), weight 66.7 kg (147 lb), SpO2 98 %. No results found. Results for orders placed or performed during the hospital encounter of 07/20/16 (from the past 72 hour(s))  Glucose, capillary     Status: Abnormal   Collection Time: 07/22/16 10:43 PM  Result Value Ref Range   Glucose-Capillary 107 (H) 65 - 99 mg/dL  Glucose, capillary     Status: Abnormal   Collection Time: 07/23/16  6:36 AM  Result Value Ref Range   Glucose-Capillary 105 (H) 65 - 99 mg/dL  Glucose, capillary     Status: Abnormal   Collection Time: 07/23/16 11:45 AM  Result Value Ref Range   Glucose-Capillary 123 (H) 65 - 99 mg/dL  Glucose, capillary     Status: None   Collection Time: 07/23/16  4:37 PM  Result Value Ref Range   Glucose-Capillary 87 65 - 99 mg/dL  Glucose, capillary     Status: Abnormal   Collection Time: 07/23/16  9:03 PM  Result Value Ref Range   Glucose-Capillary 106 (H) 65 - 99 mg/dL  Basic metabolic panel     Status: Abnormal   Collection Time: 07/24/16  4:48 AM  Result Value Ref Range   Sodium 135 135 - 145 mmol/L   Potassium 3.4 (L) 3.5 - 5.1 mmol/L   Chloride 97 (L) 101 - 111 mmol/L   CO2 28 22 - 32 mmol/L   Glucose, Bld 90 65 - 99 mg/dL   BUN 13 6 - 20 mg/dL   Creatinine, Ser 1.07 (H) 0.44 - 1.00 mg/dL   Calcium 8.2 (L) 8.9 - 10.3 mg/dL   GFR calc non Af Amer 54 (L) >60 mL/min   GFR calc Af Amer >60 >60 mL/min    Comment: (NOTE) The eGFR has been calculated using the CKD EPI equation. This calculation has not been validated in all clinical situations. eGFR's persistently <60 mL/min signify possible Chronic Kidney Disease.    Anion gap 10 5 - 15  Glucose, capillary      Status: Abnormal   Collection Time: 07/24/16  7:12 AM  Result Value Ref Range   Glucose-Capillary 101 (H) 65 - 99 mg/dL  Glucose, capillary     Status: Abnormal   Collection Time: 07/24/16 11:53 AM  Result Value Ref Range   Glucose-Capillary 107 (H) 65 - 99 mg/dL  Glucose, capillary     Status: Abnormal   Collection Time: 07/24/16  4:34 PM  Result Value Ref Range   Glucose-Capillary 101 (H) 65 - 99 mg/dL  Glucose, capillary     Status: Abnormal   Collection Time: 07/24/16  8:27 PM  Result Value Ref Range   Glucose-Capillary 108 (H) 65 - 99 mg/dL  Glucose, capillary     Status: Abnormal   Collection Time: 07/25/16  6:32 AM  Result Value Ref Range   Glucose-Capillary 103 (H) 65 - 99 mg/dL  Glucose, capillary     Status: Abnormal   Collection Time: 07/25/16 11:24 AM  Result Value Ref Range   Glucose-Capillary 123 (H) 65 - 99 mg/dL   Comment 1 Notify RN   Glucose, capillary     Status: None   Collection Time: 07/25/16  4:19 PM  Result Value Ref Range   Glucose-Capillary 95  65 - 99 mg/dL   Comment 1 Notify RN       General: No acute distress Mood and affect are appropriate Heart: Regular rate and rhythm no rubs murmurs or extra sounds Lungs: Clear to auscultation, breathing unlabored, no rales or wheezes Abdomen: Positive bowel sounds, soft nontender to palpation, nondistended, midline abdominal incision with Dermabond. No drainage Extremities: No clubbing, cyanosis, moderate left foot dorsal edema Skin: No evidence of breakdown, no evidence of rash Neurologic: Cranial nerves II through XII intact, motor strength is 4/5 in bilateral deltoid, bicep, tricep, grip,4- hip flexor, knee extensors, ankle dorsiflexor and plantar flexor Sensory exam normal sensation to light touch and proprioception in bilateral upper and lower extremities Cerebellar exam normal finger to nose to finger as well as heel to shin in bilateral upper and lower extremities Musculoskeletal: Full range of motion  in all 4 extremities. No joint swelling   Assessment/Plan: 1. Functional deficits secondary to deconditioning related to severe peripheral vascular disease requiring redo femoropopliteal bypass which require 3+ hours per day of interdisciplinary therapy in a comprehensive inpatient rehab setting. Physiatrist is providing close team supervision and 24 hour management of active medical problems listed below. Physiatrist and rehab team continue to assess barriers to discharge/monitor patient progress toward functional and medical goals. FIM: Function - Bathing Position: Wheelchair/chair at sink Body parts bathed by patient: Right arm, Left arm, Chest, Abdomen, Front perineal area, Buttocks, Right upper leg, Left upper leg, Right lower leg, Left lower leg Body parts bathed by helper: Back Bathing not applicable: Back Assist Level: Set up Set up : To obtain items  Function- Upper Body Dressing/Undressing What is the patient wearing?: Pull over shirt/dress, Bra Bra - Perfomed by patient: Thread/unthread right bra strap, Thread/unthread left bra strap, Hook/unhook bra (pull down sports bra) Pull over shirt/dress - Perfomed by patient: Thread/unthread left sleeve, Put head through opening, Pull shirt over trunk, Thread/unthread right sleeve Pull over shirt/dress - Perfomed by helper: Thread/unthread right sleeve Assist Level: More than reasonable time Set up : To obtain clothing/put away Function - Lower Body Dressing/Undressing What is the patient wearing?: Underwear, Pants, Non-skid slipper socks Position: Wheelchair/chair at sink Underwear - Performed by patient: Thread/unthread right underwear leg, Pull underwear up/down, Thread/unthread left underwear leg Underwear - Performed by helper: Thread/unthread left underwear leg Pants- Performed by patient: Thread/unthread right pants leg, Thread/unthread left pants leg, Pull pants up/down Pants- Performed by helper: Thread/unthread left pants  leg Non-skid slipper socks- Performed by patient: Don/doff left sock, Don/doff right sock Non-skid slipper socks- Performed by helper: Don/doff left sock Assist for footwear: Setup Assist for lower body dressing: Supervision or verbal cues  Function - Toileting Toileting steps completed by patient: Adjust clothing prior to toileting, Performs perineal hygiene, Adjust clothing after toileting Toileting Assistive Devices: Grab bar or rail Assist level: Supervision or verbal cues  Function Midwife transfer assistive device: Grab bar Assist level to toilet: Supervision or verbal cues Assist level from toilet: Supervision or verbal cues Assist level to bedside commode (at bedside): 2 helpers Assist level from bedside commode (at bedside): Maximal assist (Pt 25 - 49%/lift and lower) (per Dorian Heckle, NT)  Function - Chair/bed transfer Chair/bed transfer method: Stand pivot Chair/bed transfer assist level: Supervision or verbal cues Chair/bed transfer assistive device: Armrests Chair/bed transfer details: Verbal cues for precautions/safety  Function - Locomotion: Wheelchair Type: Manual Max wheelchair distance: 150 Assist Level: Maximal assistance (Pt 25 - 49%) Assist Level: Dependent (Pt equals 0%) Assist  Level: Dependent (Pt equals 0%) Turns around,maneuvers to table,bed, and toilet,negotiates 3% grade,maneuvers on rugs and over doorsills: No Function - Locomotion: Ambulation Assistive device: Walker-rolling Max distance: 30 Assist level: Supervision or verbal cues Assist level: Supervision or verbal cues Walk 50 feet with 2 turns activity did not occur: Safety/medical concerns Assist level: Touching or steadying assistance (Pt > 75%) Walk 150 feet activity did not occur: Safety/medical concerns Walk 10 feet on uneven surfaces activity did not occur: Safety/medical concerns Assist level: Supervision or verbal cues  Function - Comprehension Comprehension:  Auditory Comprehension assist level: Follows basic conversation/direction with extra time/assistive device  Function - Expression Expression: Verbal Expression assist level: Expresses basic needs/ideas: With no assist  Function - Social Interaction Social Interaction assist level: Interacts appropriately with others with medication or extra time (anti-anxiety, antidepressant).  Function - Problem Solving Problem solving assist level: Solves basic problems with no assist  Function - Memory Memory assist level: Recognizes or recalls 90% of the time/requires cueing < 10% of the time Patient normally able to recall (first 3 days only): Current season, Location of own room, Staff names and faces, That he or she is in a hospital Medical Problem List and Plan: 1.  Debilitation secondary to redo aortobifemoral bypass graft and left femoral-popliteal/multi-medical Initiate PT and OT CIR level 2.  DVT Prophylaxis/Anticoagulation: Subcutaneous heparin. Monitor platelet counts of any signs of bleeding 3. Pain Management: OxyContin 15 mg every 12 hours, oxycodone immediate release as needed 4. Acute on chronic anemia. Latest hemoglobin 10.1 5. Neuropsych: This patient is capable of making decisions on her own behalf. 6. Skin/Wound Care: Routine skin checks 7. Fluids/Electrolytes/Nutrition: Routine I&O with follow-up chemistries, kidney functions are normal. However, does have low albumin- Cont Pro Stat 8. History of CVA. Aspirin 81 mg daily 9. Hypertension. Coreg 3.125 mg twice a day, HCTZ 25 mg daily clonidine patch 0.2 mg weekly. Monitor with increased mobility 10. Impaired glucose intolerance. Hemoglobin A1c 6.2. Continue sliding scale. 11. Tobacco abuse. Counseling. Nicoderm patch. 12. Hyperlipidemia. Lipitor 13. Elevated total bilirubin. Hida scan unremarkable. . Follow-up chemistries  14. Hypokalemia. Improved but still borderline low will increase KCLto 92mq recheck potassium,related to  hydrochlorothiazide  LOS (Days) 5 A FACE TO FACE EVALUATION WAS PERFORMED  Ashlinn Hemrick E 07/25/2016, 7:38 PM

## 2016-07-25 NOTE — Progress Notes (Signed)
Physical Therapy Session Note  Patient Details  Name: Wendy Townsend MRN: [id number] Date of Birth: 1951/08/05  Today's Date: 07/25/2016 PT Individual Time: 0900-0950 PT Individual Time Calculation (min): 50 min    Short Term Goals: Week 1:  PT Short Term Goal 1 (Week 1): =LTGs due to ELOS  Skilled Therapeutic Interventions/Progress Updates:    Pt resting in w/c on arrival, states pain is "okay," and agreeable to therapy session.  Session focus on gait training, activity tolerance, and stair negotiation.  Pt transported to therapy gym in w/c total assist.  Stair negotiation 2x4 steps with 2 rails and supervision with min verbal cues for sequencing of LE advancement to reduce pain in LLE.  Gait training x30' with RW and distant supervision.  Pt c/o fatigue and cold and requiring extended seated rest breaks this session and requesting to return to room early.  Pt returned to room in w/c and performs stand/pivot back to bed with supervision for safety.  Sit>supine mod I with bed rails and pt able to scoot to [facility] using rails.  Pt positioned to comfort with call bell in reach and needs met.  Missed 10 minutes of skilled PT.   Therapy Documentation Precautions:  Precautions Precautions: Fall Restrictions Weight Bearing Restrictions: No General: PT Amount of Missed Time (min): 10 Minutes PT Missed Treatment Reason: Patient fatigue   See Function Navigator for Current Functional Status.   Therapy/Group: Individual Therapy  Nikol Lemar E Penven-Crew 07/25/2016, 11:00 AM

## 2016-07-25 NOTE — IPOC Note (Signed)
Overall Plan of Care [facility]) Patient Details Name: Tarina Volk MRN: [id number] DOB: 02/15/1951  Admitting Diagnosis: debility  Hospital Problems: Active Problems:   Debilitated   Anemia of chronic disease   Prediabetes   Tobacco abuse   Elevated bilirubin     Functional Problem List: Nursing Bladder, Endurance, Medication Management, Edema, Nutrition, Pain, Safety, Sensory, Skin Integrity  PT Balance, Endurance, Pain, Safety  OT Balance, Endurance, Motor, Pain, Safety  SLP    TR         Basic ADL's: OT Grooming, Bathing, Dressing, Toileting     Advanced  ADL's: OT Simple Meal Preparation     Transfers: PT Bed Mobility, Bed to Chair, Car, Manufacturing systems engineer, Metallurgist: PT Ambulation, Emergency planning/management officer, Stairs     Additional Impairments: OT    SLP        TR      Anticipated Outcomes Item Anticipated Outcome  Self Feeding no goal established  Swallowing      Basic self-care  Supervision  Toileting  Mod I   Bathroom Transfers Supervision  Bowel/Bladder  Minimal assist  Transfers  mod I  Locomotion  mod I household, supervision community and stairs  Communication     Cognition     Pain  3 or less  Safety/Judgment  minimal assist   Therapy Plan: PT Intensity: Minimum of 1-2 x/day ,45 to 90 minutes PT Frequency: 5 out of 7 days PT Duration Estimated Length of Stay: 7-10 days OT Intensity: Minimum of 1-2 x/day, 45 to 90 minutes OT Frequency: 5 out of 7 days OT Duration/Estimated Length of Stay: 10-14 days         Team Interventions: Nursing Interventions Patient/Family Education, Medication Management, Bladder Management, Disease Management/Prevention, Pain Management, Skin Care/Wound Management, Discharge Planning  PT interventions Ambulation/gait training, Community reintegration, DME/adaptive equipment instruction, Neuromuscular re-education, Psychosocial support, Stair training, UE/LE Strength taining/ROM, Wheelchair  propulsion/positioning, UE/LE Coordination activities, Therapeutic Activities, Pain management, Training and development officer, Discharge planning, Functional mobility training, Patient/family education, Splinting/orthotics, Therapeutic Exercise  OT Interventions Training and development officer, Discharge planning, DME/adaptive equipment instruction, Disease mangement/prevention, Functional mobility training, Pain management, Patient/family education, Psychosocial support, Self Care/advanced ADL retraining, Skin care/wound managment, Therapeutic Activities, Therapeutic Exercise, UE/LE Strength taining/ROM  SLP Interventions    TR Interventions    SW/CM Interventions Discharge Planning, Psychosocial Support, Patient/Family Education    Team Discharge Planning: Destination: PT-Home ,OT- Home , SLP-  Projected Follow-up: PT-Home health PT, 24 hour supervision/assistance, OT-  24 hour supervision/assistance, Home health OT, SLP-  Projected Equipment Needs: PT-To be determined, OT- To be determined, SLP-  Equipment Details: PT- , OT-  Patient/family involved in discharge planning: PT- Patient, Family member/caregiver,  OT-Patient, SLP-   MD ELOS: 7d Medical Rehab Prognosis:  Good Assessment:  65 y.o.right handed femalewith history of CVA, admitted for CIR with debilitation secondary to redo aortobifemoral bypass graft and left femoral-popliteal bypass.Presented10/17/2017 with nonhealing wound of her second left toe. Underwent aortobifemoral bypass as well as redo left femoral exposure left femoral-popliteal bypass 07/14/2016 per Dr. Bridgett Larsson.  The patient requires rehabilitation level in hospital due to multiple medical issues, needs observation of gangrenous toe.  Now requiring 24/7 Rehab RN,MD, as well as CIR level PT, OT .  Treatment team will focus on ADLs and mobility with goals set at supervision/modified independent  See Team Conference Notes for weekly updates to the plan of care

## 2016-07-26 ENCOUNTER — Inpatient Hospital Stay (HOSPITAL_COMMUNITY): Payer: Commercial Managed Care - HMO | Admitting: Occupational Therapy

## 2016-07-26 ENCOUNTER — Inpatient Hospital Stay (HOSPITAL_COMMUNITY): Payer: Commercial Managed Care - HMO | Admitting: Physical Therapy

## 2016-07-26 LAB — GLUCOSE, CAPILLARY
GLUCOSE-CAPILLARY: 96 mg/dL (ref 65–99)
Glucose-Capillary: 114 mg/dL — ABNORMAL HIGH (ref 65–99)
Glucose-Capillary: 103 mg/dL — ABNORMAL HIGH (ref 65–99)
Glucose-Capillary: 123 mg/dL — ABNORMAL HIGH (ref 65–99)

## 2016-07-26 LAB — BASIC METABOLIC PANEL
Anion gap: 10 (ref 5–15)
BUN: 19 mg/dL (ref 6–20)
CALCIUM: 8.9 mg/dL (ref 8.9–10.3)
CO2: 27 mmol/L (ref 22–32)
CREATININE: 1.14 mg/dL — AB (ref 0.44–1.00)
Chloride: 97 mmol/L — ABNORMAL LOW (ref 101–111)
GFR calc Af Amer: 58 mL/min — ABNORMAL LOW (ref >60)
GFR, EST NON AFRICAN AMERICAN: 50 mL/min — AB (ref >60)
GLUCOSE: 103 mg/dL — AB (ref 65–99)
Potassium: 3.9 mmol/L (ref 3.5–5.1)
Sodium: 134 mmol/L — ABNORMAL LOW (ref 135–145)

## 2016-07-26 NOTE — Discharge Instructions (Signed)
Inpatient Rehab Discharge Instructions  Emmalee Solivan Discharge date and time: No discharge date for patient encounter.   Activities/Precautions/ Functional Status: Activity: activity as tolerated Diet: regular diet Wound Care: keep wound clean and dry Functional status:  ___ No restrictions     ___ Walk up steps independently ___ 24/7 supervision/assistance   ___ Walk up steps with assistance ___ Intermittent supervision/assistance  ___ Bathe/dress independently ___ Walk with walker     __x_ Bathe/dress with assistance ___ Walk Independently    ___ Shower independently ___ Walk with assistance    ___ Shower with assistance ___ No alcohol     ___ Return to work/school ________  Special Instructions:    COMMUNITY REFERRALS UPON DISCHARGE:    Home Health:   PT, OT, RN  [facility]   Date of last service:07/27/2016  Medical Equipment/Items Ordered:ROLLATOR Ephraim   [phone]    My questions have been answered and I understand these instructions. I will adhere to these goals and the provided educational materials after my discharge from the hospital.  Patient/Caregiver Signature _______________________________ Date __________  Clinician Signature _______________________________________ Date __________  Please bring this form and your medication list with you to all your follow-up doctor's appointments.

## 2016-07-26 NOTE — Patient Care Conference (Signed)
Inpatient RehabilitationTeam Conference and Plan of Care Update Date: 07/27/2016   Time: 11:15 AM    Patient Name: Wendy Townsend      Medical Record Number: [id number]  Date of Birth: 11-Aug-1951 Sex: Female         Room/Bed: 4W10C/4W10C-01 Payor Info: Payor: [employer] MEDICARE / Plan: [place] THN/NTSP / Product Type: *No Product type* /    Admitting Diagnosis: debility  Admit Date/Time:  07/20/2016  2:57 PM Admission Comments: No comment available   Primary Diagnosis:  <principal problem not specified> Principal Problem: <principal problem not specified>  Patient Active Problem List   Diagnosis Date Noted  . Debilitated 07/20/2016  . Anemia of chronic disease   . Prediabetes   . Tobacco abuse   . Elevated bilirubin   . History of CVA (cerebrovascular accident)   . Benign essential HTN   . Diastolic dysfunction   . Coronary artery disease involving native coronary artery of native heart without angina pectoris   . Post-operative pain   . Acute blood loss anemia   . Diarrhea   . Hypophosphatemia   . Hypomagnesemia   . Hypoalbuminemia due to protein-calorie malnutrition ([place])   . Lymphocytosis   . Encounter for rehabilitation 07/18/2016  . Status post aortobifemoral bypass surgery   . Acute respiratory failure with hypoxia ([place])   . Acute renal failure superimposed on stage 3 chronic kidney disease ([place]) 07/13/2016  . Essential hypertension 07/13/2016  . Impaired glucose tolerance 07/13/2016  . Ischemic pain of foot, left   . Abnormal stress test   . Dyspnea   . Dry gangrene ([place]) 07/06/2016  . PVD (peripheral vascular disease) ([place]) 07/06/2016  . Hyperglycemia 07/06/2016  . Acute kidney injury superimposed on chronic kidney disease ([place]) 07/06/2016  . Protein-calorie malnutrition, severe 07/06/2016  . Ischemic pain of left foot 07/05/2016  . History of stroke 06/08/2016  . Left hemiparesis ([place]) 06/08/2016  . Numbness and tingling of left leg 06/08/2016  .  Tobacco use disorder 06/08/2016  . Hypertension 06/28/2011  . Hyperlipidemia 06/28/2011  . PAD (peripheral artery disease) ([place]) 06/28/2011    Expected Discharge Date: Expected Discharge Date: 07/27/16  Team Members Present: Physician leading conference: Dr. Alysia Penna Social Worker Present: Ovidio Kin, LCSW Nurse Present: Dorien Chihuahua, RN PT Present: Dwyane Dee, PT OT Present: Simonne Come, OT SLP Present: Windell Moulding, SLP PPS Coordinator present : Daiva Nakayama, RN, CRRN     Current Status/Progress Goal Weekly Team Focus  Medical   Abd wounds healing well , no GI issues  Home with [facility] support  D/C planning   Bowel/Bladder     cont B & B        Swallow/Nutrition/ Hydration             ADL's   supevision standing and transfers, Mod I grooming and UB dressing  supervision standing and transfers, Mod I grooming and UB dressing  d/c planning   Mobility   supervision, mod I  supervision, mod I  d/c planning, activity tolerance, grad day 11/7   Communication             Safety/Cognition/ Behavioral Observations    na        Pain             Skin     na           *See Care Plan and progress notes for long and short-term goals.  Barriers to Discharge: none  Possible Resolutions to Barriers:  D/C home today    Discharge Planning/Teaching Needs:    Home with family member's who can provide 24 hr supervision level.     Team Discussion:  Home with family members, did well here and reached her goals quickly. Using rollator rolling walker now. Medically stable for DC today.  Revisions to Treatment Plan:  DC today   Continued Need for Acute Rehabilitation Level of Care: The patient requires daily medical management by a physician with specialized training in physical medicine and rehabilitation for the following conditions: Daily direction of a multidisciplinary physical rehabilitation program to ensure safe treatment while eliciting the highest  outcome that is of practical value to the patient.: Yes Daily medical management of patient stability for increased activity during participation in an intensive rehabilitation regime.: Yes Daily analysis of laboratory values and/or radiology reports with any subsequent need for medication adjustment of medical intervention for : Post surgical problems;Other  Demetrice Amstutz, Gardiner Rhyme 07/27/2016, 11:40 AM

## 2016-07-26 NOTE — Progress Notes (Signed)
Occupational Therapy Discharge Summary  Patient Details  Name: Cybele Maule MRN: [id number] Date of Birth: 08/12/1951  Patient has met 9 of 9 long term goals due to improved activity tolerance, improved balance, postural control and improved awareness.  Patient to discharge at overall Supervision level.  Patient's care partner is independent to provide the necessary assistance at discharge. Patient is cognitively intact and had 24 hr supervision prior to admission due to previous stroke and will return to same level of assistance.  Patient is able to verbalize transfer techniques in/out of shower and reports she will have assistance during showers.    Reasons goals not met: N/A  Recommendation:  Patient will benefit from ongoing skilled OT services in home health setting to continue to advance functional skills in the area of BADL and Reduce care partner burden.  Equipment: No equipment provided  Reasons for discharge: treatment goals met and discharge from hospital  Patient/family agrees with progress made and goals achieved: Yes  OT Discharge Precautions/Restrictions  Precautions Precautions: Fall Restrictions Weight Bearing Restrictions: No Pain Pain Assessment Pain Assessment: 0-10 Pain Score: 3  Pain Type: Acute pain;Chronic pain Pain Location: Abdomen Pain Orientation: Medial Pain Descriptors / Indicators: Tender;Aching Pain Intervention(s): RN made aware;Refused ADL  See Function Navigator Vision/Perception  Vision- History Baseline Vision/History: Wears glasses Wears Glasses: Reading only Patient Visual Report: No change from baseline Vision- Assessment Vision Assessment?: No apparent visual deficits Perception Comments: WFL  Cognition Overall Cognitive Status: Within Functional Limits for tasks assessed Arousal/Alertness: Awake/alert Orientation Level: Oriented X4 Attention: Selective Selective Attention: Appears intact Memory: Appears intact Awareness:  Appears intact Safety/Judgment: Appears intact Sensation Sensation Light Touch: Appears Intact Stereognosis: Not tested Hot/Cold: Appears Intact Proprioception: Appears Intact Coordination Fine Motor Movements are Fluid and Coordinated: Yes Motor    Mobility  Bed Mobility Bed Mobility: Rolling Right;Supine to Sit;Sit to Supine;Rolling Left (low bed in rehab apartment) Rolling Right: 6: Modified independent (Device/Increase time) Rolling Left: 6: Modified independent (Device/Increase time) Supine to Sit: 6: Modified independent (Device/Increase time) Sit to Supine: 6: Modified independent (Device/Increase time) Transfers Sit to Stand: 6: Modified independent (Device/Increase time) Stand to Sit: 6: Modified independent (Device/Increase time)  Extremity/Trunk Assessment RUE Assessment RUE Assessment: Within Functional Limits LUE Assessment LUE Assessment: Within Functional Limits   See Function Navigator for Current Functional Status.  Simonne Come 07/26/2016, 3:18 PM

## 2016-07-26 NOTE — Progress Notes (Signed)
Physical Therapy Session Note  Patient Details  Name: Wendy Townsend MRN: [id number] Date of Birth: [birth year]  Today's Date: 07/26/2016 PT Individual Time: 0909-1006 PT Individual Time Calculation (min): 57 min    Short Term Goals: Week 1:  PT Short Term Goal 1 (Week 1): =LTGs due to ELOS  Skilled Therapeutic Interventions/Progress Updates:    Pt received in bed & agreeable to tx, denying c/o pain. Pt completed transfer bed>w/c with supervision via stand pivot. Transported pt to gym via w/c total assist for time management. Pt ambulated 16 ft with RW & mod I over even surfaces & 10 ft with RW over uneven surfaces with supervision. Pt demonstrates decreased step length LLE, decreased weight shift to L, poor L hip & knee flexion & decreased foot clearance 2/2 pain & weakness in LLE. Pt negotiates 24 steps (6" + 3") with B rails & Mod I with encouragement to complete task. Pt performed bed mobility and car transfers with supervision. Pt very eager to d/c home tomorrow. At end of session pt left sitting in w/c in room with instructions to call for assistance when wanting to transfer out of w/c & pt voiced understanding. All needs within reach.    Therapy Documentation Precautions:  Precautions Precautions: Fall Restrictions Weight Bearing Restrictions: No  Pain: Pain Assessment Pain Assessment: No/denies pain   Mobility: Bed Mobility Bed Mobility: Rolling Right;Supine to Sit;Sit to Supine;Rolling Left (low bed in rehab apartment) Rolling Right: 6: Modified independent (Device/Increase time) Rolling Left: 6: Modified independent (Device/Increase time) Supine to Sit: 6: Modified independent (Device/Increase time) Sit to Supine: 6: Modified independent (Device/Increase time) Transfers Transfers: Yes Sit to Stand: 6: Modified independent (Device/Increase time) Stand to Sit: 6: Modified independent (Device/Increase time) Stand Pivot Transfers: 6: Modified independent (Device/Increase  time);With armrests   Locomotion : Ambulation Ambulation: Yes Ambulation/Gait Assistance: 6: Modified independent (Device/Increase time) Ambulation Distance (Feet): 78 Feet Assistive device: Rolling walker Gait Gait: Yes Gait Pattern: Decreased step length - left;Decreased stance time - right;Decreased stride length;Decreased hip/knee flexion - left (decreased weight shift L) Stairs / Additional Locomotion Stairs: Yes Stairs Assistance: 5: Mod I Stair Management Technique: Two rails Number of Stairs: 24 Height of Stairs:  (6 inches + 3 inches)    See Function Navigator for Current Functional Status.   Therapy/Group: Individual Therapy  Waunita Schooner 07/26/2016, 6:05 PM

## 2016-07-26 NOTE — Progress Notes (Signed)
Physical Therapy Session Note  Patient Details  Name: Wendy Townsend MRN: [id number] Date of Birth: March 29, 1951  Today's Date: 07/26/2016 PT Individual Time: 1500-1525 PT Individual Time Calculation (min): 25 min    Short Term Goals: Week 1:  PT Short Term Goal 1 (Week 1): =LTGs due to ELOS  Skilled Therapeutic Interventions/Progress Updates:    Pt resting in bed on arrival, no c/o pain but declines leaving room from therapy stating she is cold.  PT provided education on upcoming discharge and equipment recommendations (notfied CSW).  PT provided pt with HEP for use at home and reviewed verbally with pt.  Pt verbalized understanding of all and positioned supine in bed mod I.  Call bell in reach and needs met.   Therapy Documentation Precautions:  Precautions Precautions: Fall Restrictions Weight Bearing Restrictions: No   See Function Navigator for Current Functional Status.   Therapy/Group: Individual Therapy  Earnest Conroy Penven-Crew 07/26/2016, 4:43 PM

## 2016-07-26 NOTE — Progress Notes (Signed)
Physical Therapy Discharge Summary  Patient Details  Name: Wendy Townsend MRN: [id number] Date of Birth: 1950-12-31  Today's Date: 07/26/2016  Patient has met 8 of 8 long term goals due to improved activity tolerance, improved balance, improved postural control, increased strength, increased range of motion, decreased pain, ability to compensate for deficits, improved attention and improved awareness.  Patient to discharge at a limited household ambulatory level and using w/c for community distances.   Patient's care partner has been providing supervision/min assist prior to admission and is able to continue this at d/c to provide the necessary supervision/min  assistance at discharge.   Recommendation:  Patient will benefit from ongoing skilled PT services in home health setting to continue to advance safe functional mobility, address ongoing impairments in strength, activity tolerance, and balance, and minimize fall risk.  Equipment: rollator  Reasons for discharge: treatment goals met  Patient/family agrees with progress made and goals achieved: Yes  PT Discharge Precautions/Restrictions Precautions Precautions: Fall Restrictions Weight Bearing Restrictions: No Pain Pain Assessment Pain Assessment: 0-10 Pain Score: 3  Pain Type: Acute pain;Chronic pain Pain Location: Abdomen Pain Orientation: Medial Pain Descriptors / Indicators: Tender;Aching Pain Intervention(s): RN made aware;Refused Vision/Perception  Perception Comments: WFL  Cognition Overall Cognitive Status: Within Functional Limits for tasks assessed Arousal/Alertness: Awake/alert Orientation Level: Oriented X4 Attention: Selective Selective Attention: Appears intact Memory: Appears intact Awareness: Appears intact Safety/Judgment: Appears intact  Mobility Bed Mobility Bed Mobility: Rolling Right;Supine to Sit;Sit to Supine;Rolling Left (low bed in rehab apartment) Rolling Right: 6: Modified independent  (Device/Increase time) Rolling Left: 6: Modified independent (Device/Increase time) Supine to Sit: 6: Modified independent (Device/Increase time) Sit to Supine: 6: Modified independent (Device/Increase time) Transfers Transfers: Yes Sit to Stand: 6: Modified independent (Device/Increase time) Stand to Sit: 6: Modified independent (Device/Increase time) Stand Pivot Transfers: 6: Modified independent (Device/Increase time);With armrests Locomotion  Ambulation Ambulation: Yes Ambulation/Gait Assistance: 6: Modified independent (Device/Increase time) Assistive device: Rolling walker Gait Gait: Yes Gait Pattern: Decreased step length - left;Decreased stance time - right;Decreased stride length;Decreased hip/knee flexion - left (decreased weight shift L) Stairs / Additional Locomotion Stairs: Yes Stairs Assistance: 5: Supervision Stair Management Technique: Two rails Height of Stairs:  (6 inches + 3 inches)    See Function Navigator for Current Functional Status.  Emmerson Shuffield E Penven-Crew 07/26/2016, 3:18 PM

## 2016-07-26 NOTE — Progress Notes (Signed)
Social Work  Discharge Note  The overall goal for the admission was met for:   Discharge location: Yes-HOME WITH FAMILY(BROTHER, NEPHEW, NIECE SISTER)  Length of Stay: Yes-7 DAYS  Discharge activity level: Yes-MOD/I-SUPERVISION LEVEL  Home/community participation: Yes  Services provided included: MD, RD, PT, OT, RN, CM, TR, Pharmacy and SW  Financial Services: Private Insurance: [place]  Follow-up services arranged: Home Health: [place] [facility]-PT,OT,RN and Patient/Family request agency HH: HAD BEFORE, DME: NO NEEDS-ROLLATOR ROLLING WALKER ORDERED FOR PT  Comments (or additional information):BETWEEN THE FAMILY THEY WILL PROVIDE 24 HR SUPERVISION LEVEL. PT DID WELL HERE AND REACHED HER GOALS QUICKLY HAS ALL NEEDED EQUIPMENT  Patient/Family verbalized understanding of follow-up arrangements: Yes  Individual responsible for coordination of the follow-up plan: SELF & YASHEKIA-NIECE  Confirmed correct DME delivered: Elease Hashimoto 07/26/2016    Elease Hashimoto

## 2016-07-26 NOTE — Progress Notes (Signed)
Subjective/Complaints: No issues overnite  Review of systems negative for chest pain, shortness breath, nausea, vomiting, diarrhea, positive constipation Objective: Vital Signs: Blood pressure 122/74, pulse 80, temperature 98.1 F (36.7 C), temperature source Oral, resp. rate 16, height 5' 5"  (1.651 m), weight 66.7 kg (147 lb), SpO2 98 %. No results found. Results for orders placed or performed during the hospital encounter of 07/20/16 (from the past 72 hour(s))  Glucose, capillary     Status: Abnormal   Collection Time: 07/23/16 11:45 AM  Result Value Ref Range   Glucose-Capillary 123 (H) 65 - 99 mg/dL  Glucose, capillary     Status: None   Collection Time: 07/23/16  4:37 PM  Result Value Ref Range   Glucose-Capillary 87 65 - 99 mg/dL  Glucose, capillary     Status: Abnormal   Collection Time: 07/23/16  9:03 PM  Result Value Ref Range   Glucose-Capillary 106 (H) 65 - 99 mg/dL  Basic metabolic panel     Status: Abnormal   Collection Time: 07/24/16  4:48 AM  Result Value Ref Range   Sodium 135 135 - 145 mmol/L   Potassium 3.4 (L) 3.5 - 5.1 mmol/L   Chloride 97 (L) 101 - 111 mmol/L   CO2 28 22 - 32 mmol/L   Glucose, Bld 90 65 - 99 mg/dL   BUN 13 6 - 20 mg/dL   Creatinine, Ser 1.07 (H) 0.44 - 1.00 mg/dL   Calcium 8.2 (L) 8.9 - 10.3 mg/dL   GFR calc non Af Amer 54 (L) >60 mL/min   GFR calc Af Amer >60 >60 mL/min    Comment: (NOTE) The eGFR has been calculated using the CKD EPI equation. This calculation has not been validated in all clinical situations. eGFR's persistently <60 mL/min signify possible Chronic Kidney Disease.    Anion gap 10 5 - 15  Glucose, capillary     Status: Abnormal   Collection Time: 07/24/16  7:12 AM  Result Value Ref Range   Glucose-Capillary 101 (H) 65 - 99 mg/dL  Glucose, capillary     Status: Abnormal   Collection Time: 07/24/16 11:53 AM  Result Value Ref Range   Glucose-Capillary 107 (H) 65 - 99 mg/dL  Glucose, capillary     Status:  Abnormal   Collection Time: 07/24/16  4:34 PM  Result Value Ref Range   Glucose-Capillary 101 (H) 65 - 99 mg/dL  Glucose, capillary     Status: Abnormal   Collection Time: 07/24/16  8:27 PM  Result Value Ref Range   Glucose-Capillary 108 (H) 65 - 99 mg/dL  Glucose, capillary     Status: Abnormal   Collection Time: 07/25/16  6:32 AM  Result Value Ref Range   Glucose-Capillary 103 (H) 65 - 99 mg/dL  Glucose, capillary     Status: Abnormal   Collection Time: 07/25/16 11:24 AM  Result Value Ref Range   Glucose-Capillary 123 (H) 65 - 99 mg/dL   Comment 1 Notify RN   Glucose, capillary     Status: None   Collection Time: 07/25/16  4:19 PM  Result Value Ref Range   Glucose-Capillary 95 65 - 99 mg/dL   Comment 1 Notify RN   Glucose, capillary     Status: None   Collection Time: 07/25/16  8:48 PM  Result Value Ref Range   Glucose-Capillary 91 65 - 99 mg/dL  Glucose, capillary     Status: None   Collection Time: 07/26/16  6:35 AM  Result Value Ref Range  Glucose-Capillary 96 65 - 99 mg/dL      General: No acute distress Mood and affect are appropriate Heart: Regular rate and rhythm no rubs murmurs or extra sounds Lungs: Clear to auscultation, breathing unlabored, no rales or wheezes Abdomen: Positive bowel sounds, soft nontender to palpation, nondistended, midline abdominal incision with Dermabond. No drainage Extremities: No clubbing, cyanosis, moderate left foot dorsal edema Skin: No evidence of breakdown, no evidence of rash Neurologic: Cranial nerves II through XII intact, motor strength is 4/5 in bilateral deltoid, bicep, tricep, grip,4- hip flexor, knee extensors, ankle dorsiflexor and plantar flexor Sensory exam normal sensation to light touch and proprioception in bilateral upper and lower extremities Cerebellar exam normal finger to nose to finger as well as heel to shin in bilateral upper and lower extremities Musculoskeletal: Full range of motion in all 4 extremities. No  joint swelling   Assessment/Plan: 1. Functional deficits secondary to deconditioning related to severe peripheral vascular disease requiring redo femoropopliteal bypass which require 3+ hours per day of interdisciplinary therapy in a comprehensive inpatient rehab setting. Physiatrist is providing close team supervision and 24 hour management of active medical problems listed below. Physiatrist and rehab team continue to assess barriers to discharge/monitor patient progress toward functional and medical goals. FIM: Function - Bathing Position: Wheelchair/chair at sink Body parts bathed by patient: Right arm, Left arm, Chest, Abdomen, Front perineal area, Buttocks, Right upper leg, Left upper leg, Right lower leg, Left lower leg Body parts bathed by helper: Back Bathing not applicable: Back Assist Level: Set up Set up : To obtain items  Function- Upper Body Dressing/Undressing What is the patient wearing?: Pull over shirt/dress, Bra Bra - Perfomed by patient: Thread/unthread right bra strap, Thread/unthread left bra strap, Hook/unhook bra (pull down sports bra) Pull over shirt/dress - Perfomed by patient: Thread/unthread left sleeve, Put head through opening, Pull shirt over trunk, Thread/unthread right sleeve Pull over shirt/dress - Perfomed by helper: Thread/unthread right sleeve Assist Level: More than reasonable time Set up : To obtain clothing/put away Function - Lower Body Dressing/Undressing What is the patient wearing?: Underwear, Pants, Non-skid slipper socks Position: Wheelchair/chair at sink Underwear - Performed by patient: Thread/unthread right underwear leg, Pull underwear up/down, Thread/unthread left underwear leg Underwear - Performed by helper: Thread/unthread left underwear leg Pants- Performed by patient: Thread/unthread right pants leg, Thread/unthread left pants leg, Pull pants up/down Pants- Performed by helper: Thread/unthread left pants leg Non-skid slipper socks-  Performed by patient: Don/doff left sock, Don/doff right sock Non-skid slipper socks- Performed by helper: Don/doff left sock Assist for footwear: Setup Assist for lower body dressing: Supervision or verbal cues  Function - Toileting Toileting steps completed by patient: Adjust clothing prior to toileting, Performs perineal hygiene, Adjust clothing after toileting Toileting Assistive Devices: Grab bar or rail Assist level: Supervision or verbal cues  Function Midwife transfer assistive device: Grab bar Assist level to toilet: Supervision or verbal cues Assist level from toilet: Supervision or verbal cues Assist level to bedside commode (at bedside): 2 helpers Assist level from bedside commode (at bedside): Maximal assist (Pt 25 - 49%/lift and lower) (per Dorian Heckle, NT)  Function - Chair/bed transfer Chair/bed transfer method: Stand pivot Chair/bed transfer assist level: Supervision or verbal cues Chair/bed transfer assistive device: Armrests Chair/bed transfer details: Verbal cues for precautions/safety  Function - Locomotion: Wheelchair Type: Manual Max wheelchair distance: 150 Assist Level: Maximal assistance (Pt 25 - 49%) Assist Level: Dependent (Pt equals 0%) Assist Level: Dependent (Pt equals 0%)  Turns around,maneuvers to table,bed, and toilet,negotiates 3% grade,maneuvers on rugs and over doorsills: No Function - Locomotion: Ambulation Assistive device: Walker-rolling Max distance: 30 Assist level: Supervision or verbal cues Assist level: Supervision or verbal cues Walk 50 feet with 2 turns activity did not occur: Safety/medical concerns Assist level: Touching or steadying assistance (Pt > 75%) Walk 150 feet activity did not occur: Safety/medical concerns Walk 10 feet on uneven surfaces activity did not occur: Safety/medical concerns Assist level: Supervision or verbal cues  Function - Comprehension Comprehension: Auditory Comprehension assist  level: Follows basic conversation/direction with extra time/assistive device  Function - Expression Expression: Verbal Expression assist level: Expresses basic needs/ideas: With no assist  Function - Social Interaction Social Interaction assist level: Interacts appropriately with others with medication or extra time (anti-anxiety, antidepressant).  Function - Problem Solving Problem solving assist level: Solves basic problems with no assist  Function - Memory Memory assist level: Recognizes or recalls 90% of the time/requires cueing < 10% of the time Patient normally able to recall (first 3 days only): Current season, Location of own room, Staff names and faces, That he or she is in a hospital Medical Problem List and Plan: 1.  Debilitation secondary to redo aortobifemoral bypass graft and left femoral-popliteal/multi-medical Plan d/c in am 2.  DVT Prophylaxis/Anticoagulation: Subcutaneous heparin. Monitor platelet counts of any signs of bleeding 3. Pain Management: OxyContin 15 mg every 12 hours, oxycodone immediate release as needed 4. Acute on chronic anemia. Latest hemoglobin 10.1 5. Neuropsych: This patient is capable of making decisions on her own behalf. 6. Skin/Wound Care: Routine skin checks 7. Fluids/Electrolytes/Nutrition: Routine I&O with follow-up chemistries, kidney functions are normal. However, does have low albumin- Cont Pro Stat 8. History of CVA. Aspirin 81 mg daily 9. Hypertension. Coreg 3.125 mg twice a day, HCTZ 25 mg daily clonidine patch 0.2 mg weekly. Monitor with increased mobility 10. Impaired glucose intolerance. Hemoglobin A1c 6.2. Continue sliding scale. 11. Tobacco abuse. Counseling. Nicoderm patch. 12. Hyperlipidemia. Lipitor 13. Elevated total bilirubin. Hida scan unremarkable. . Follow-up chemistries  14. Hypokalemia. Improved but still borderline low will increase KCLto 5mq recheck potassium in am,related to hydrochlorothiazide  LOS (Days) 6 A  FACE TO FACE EVALUATION WAS PERFORMED  Korene Dula E 07/26/2016, 8:50 AM

## 2016-07-26 NOTE — Discharge Summary (Signed)
NAMELACRETIA, TINDALL NO.:  [id number]  MEDICAL RECORD NO.:  [id number]  LOCATION:  4W10C                        FACILITY:  [facility]  PHYSICIAN:  Charlett Blake, M.D.DATE OF BIRTH:  12/08/1950  DATE OF ADMISSION:  07/20/2016 DATE OF DISCHARGE:  07/27/2016                              DISCHARGE SUMMARY   DISCHARGE DIAGNOSES: 1. Debilitation secondary to redo aortobifemoral bypass grafting and     left femoral-popliteal. 2. Subcutaneous heparin for deep vein thrombosis prophylaxis. 3. Pain management. 4. Acute blood loss anemia. 5. History of cerebrovascular accident. 6. Hypertension. 7. Impaired glucose intolerance. 8. Tobacco abuse. 9. Hyperlipidemia. 10.Elevated total bilirubin.  HISTORY OF PRESENT ILLNESS:  This is a 65 year old, right-handed, female with history of CVA, maintained on aspirin, received inpatient rehab services in 2012 as well as history of hypertension; tobacco abuse; and peripheral vascular disease with multiple revascularization procedures. Lives with family.  Used a cane prior to admission.  Presented on July 05, 2016, with nonhealing wound of her left second toe after she splashed grease on it in early September.  She had been initially treated with doxycycline, later switched to Levaquin.  Wound culture showed gram-negative rods.  Aortogram imaging revealed thrombosed left femoral-popliteal bypass due to left iliac arterial occlusion and right external iliac artery occlusion.  Underwent preoperative cardiac clearance.  Echocardiogram with ejection fraction of 67%, grade 1 diastolic dysfunction, as well as preoperative cardiac catheterization showing mild-to-moderate nonobstructive CAD.  Underwent aortobifemoral bypass as well as redo left femoral exposure, left femoral popliteal bypass on July 14, 2016, per Dr. Bridgett Larsson.  Hospital course, pain management.  Subcutaneous heparin for DVT prophylaxis.  Acute blood loss anemia.   Received 2 units of packed red blood cells.  Total bilirubin elevated 4.2.  HIDA scan was normal.  Physical and occupational therapy ongoing.  The patient was admitted for a comprehensive rehab programme.  PAST MEDICAL HISTORY:  See discharge diagnoses.  SOCIAL HISTORY:  Lives with family, assistance as needed.  Functional status upon admission to rehab services was ambulating with a quad cane. The patient has assist with homemaking tasks.  Functional status upon admission to rehab services moderate assist, 3 feet rolling walker; max assist, sit to stand; min to mod assist, activities of daily living.  PHYSICAL EXAMINATION:  VITAL SIGNS:  Blood pressure 149/65, pulse 87, temperature 98, respirations 17. GENERAL:  This was an alert female, oriented x3. LUNGS:  Clear to auscultation without wheeze. CARDIAC:  Regular rate and rhythm.  No murmur. ABDOMEN:  Soft, nontender.  Good bowel sounds. HEENT:  Poor dentition.  Revascularization site clean and dry. EXTREMITIES:  Left lower extremity, dry skin with vascular changes, dry gangrene second digit.  REHABILITATION HOSPITAL COURSE:  The patient was admitted to inpatient rehab services with therapies initiated on a 3-hour daily basis consisting of physical therapy, occupational therapy, and rehabilitation nursing.  The following issues were addressed during the patient's rehabilitation stay.  Pertaining to Ms. Sesler's debilitation related to redo aortobifemoral bypass grafting as well as left femoral-popliteal remained stable, surgical site healing nicely, dry gangrene to second toe, she would follow up with Vascular Surgery Dr. Bridgett Larsson.  Subcutaneous heparin for DVT prophylaxis.  No bleeding  episodes.  Pain management with OxyContin that was slowly tapered as well as oxycodone for breakthrough pain.  Acute on chronic anemia, latest hemoglobin 10.1. Blood pressures remained well controlled on current regimen.  No orthostatic changes.  She  would follow up with her primary MD.  She did have a history of tobacco abuse.  She was weaned from her Nicoderm patch.  She received full counsel in regard to cessation of nicotine products.  It was questionable if she would be compliant with these requests.  She continued on Lipitor for hyperlipidemia.  The patient received weekly collaborative interdisciplinary team conferences to discuss estimated length of stay, family teaching, any barriers to discharge.  She was ambulating with a rolling walker, distance supervision.  Stair negotiation with supervision and some verbal cues. Working with energy conservation techniques.  Sit to supine, modified independent.  She could gather her belongings for activities of daily living and homemaking.  Full family teaching was completed.  Discussed no driving.  She was discharged to home.  DISCHARGE MEDICATIONS: 1. Aspirin 81 mg p.o. daily. 2. Lipitor 80 mg p.o. daily. 3. Coreg 3.125 mg p.o. b.i.d. 4. Clonidine patch 0.2 mg every Friday. 5. Pepcid 20 mg p.o. b.i.d. 6. Hydrochlorothiazide 25 mg p.o. daily. 7. Nicoderm patch, taper as directed. 8. OxyContin sustained release 10 mg every 12 hours for one week and stop. 9. Florastor 250 mg p.o. b.i.d. 10.Oxycodone immediate release 10 mg p.o. every 4 hours as needed     pain.  DIET:  Mechanical soft.  FOLLOWUP:  She would follow up with Dr. Alysia Penna at the [facility] as needed; Dr. Adele Barthel in 1 week, call for appointment; Dr. Gildardo Cranker, medical management.     Lauraine Rinne, P.A.   ______________________________ Charlett Blake, M.D.    DA/MEDQ  D:  07/26/2016  T:  07/26/2016  Job:  [id number]  cc:   Dr. Gildardo Cranker Conrad , MD

## 2016-07-26 NOTE — Progress Notes (Signed)
Occupational Therapy Session Note  Patient Details  Name: Wendy Townsend MRN: [id number] Date of Birth: October 22, 1950  Today's Date: 07/26/2016 OT Individual Time: 3335-4562 and 1415-1500 OT Individual Time Calculation (min): 60 min and 45 min    Short Term Goals: Week 1:  OT Short Term Goal 1 (Week 1): Pt will complete bathing with min assist at sit > stand level OT Short Term Goal 2 (Week 1): Pt will complete LB dressing with min assist at sit > stand level OT Short Term Goal 3 (Week 1): Pt will complete toilet transfers with min assist with use of LRAD OT Short Term Goal 4 (Week 1): Pt will completed 2 grooming tasks in standing with min assist for increased activity tolerance  Skilled Therapeutic Interventions/Progress Updates:    1) Completed ADL retraining at overall supervision level.  Pt ambulated to toilet with RW at overall distant supervision for mobility and transfers.  Completed transfer in to room shower with supervision, pt verbalizing sequence of transfer aloud.  Bathing completed at sit > stand level in room shower with overall supervision.  Pt required increased time with donning underwear and pants this session due to pain in abdomen when crossing LLE over Rt knee, but able to complete without issue.  Returned to bed at end of session due to reports of fatigue.  Discussed energy conservation techniques to increase success with desired activities at home.    2) Treatment session with focus on functional mobility and safety with Rollator.  Pt ambulated around room max of 30 feet with Rollator and supervision.  Pt able to lock and unlock brakes with mobility and even turn to sit on seat of Rollator safely.  Pt required one cue to lock brakes at the end prior to sitting down at EOB.  Engaged in standing tolerance with pt standing 6 mins x2 during table top activity.  Pt tends to off-weight her Lt foot in standing due to pain.  RN notified of pain, however pt wanting to wait till all  therapies done before taking any meds.  Therapy Documentation Precautions:  Precautions Precautions: Fall Restrictions Weight Bearing Restrictions: No General:   Vital Signs:   Pain:  Pt with c/o pain in abdomen.  Refused pain meds d/t reports of making her drowsy.  See Function Navigator for Current Functional Status.   Therapy/Group: Individual Therapy  Simonne Come 07/26/2016, 9:05 AM

## 2016-07-27 DIAGNOSIS — I739 Peripheral vascular disease, unspecified: Secondary | ICD-10-CM

## 2016-07-27 LAB — GLUCOSE, CAPILLARY
Glucose-Capillary: 102 mg/dL — ABNORMAL HIGH (ref 65–99)
Glucose-Capillary: 109 mg/dL — ABNORMAL HIGH (ref 65–99)

## 2016-07-27 MED ORDER — OXYCODONE HCL 10 MG PO TABS: 10.0000 mg | ORAL_TABLET | ORAL | 0 refills | Status: DC | PRN

## 2016-07-27 MED ORDER — OXYCODONE HCL ER 10 MG PO T12A: 10.0000 mg | EXTENDED_RELEASE_TABLET | Freq: Two times a day (BID) | ORAL | Status: DC

## 2016-07-27 MED ORDER — POTASSIUM CHLORIDE ER 10 MEQ PO CPCR: 10.0000 meq | ORAL_CAPSULE | Freq: Every day | ORAL | 11 refills | Status: DC

## 2016-07-27 MED ORDER — CARVEDILOL 3.125 MG PO TABS: 3.1250 mg | ORAL_TABLET | Freq: Two times a day (BID) | ORAL | 1 refills | Status: DC

## 2016-07-27 MED ORDER — SACCHAROMYCES BOULARDII 250 MG PO CAPS: 250.0000 mg | ORAL_CAPSULE | Freq: Two times a day (BID) | ORAL | 0 refills | Status: DC

## 2016-07-27 MED ORDER — OXYCODONE HCL ER 10 MG PO T12A
10.0000 mg | EXTENDED_RELEASE_TABLET | Freq: Two times a day (BID) | ORAL | 0 refills | Status: DC
Start: 2016-07-27 — End: 2016-08-18

## 2016-07-27 MED ORDER — FAMOTIDINE 20 MG PO TABS: 20.0000 mg | ORAL_TABLET | Freq: Two times a day (BID) | ORAL | 0 refills | Status: DC

## 2016-07-27 MED ORDER — NICOTINE 7 MG/24HR TD PT24: MEDICATED_PATCH | TRANSDERMAL | 0 refills | Status: DC

## 2016-07-27 MED ORDER — CLONIDINE HCL 0.2 MG/24HR TD PTWK: 0.2000 mg | MEDICATED_PATCH | TRANSDERMAL | 12 refills | Status: DC

## 2016-07-27 MED ORDER — HYDROCHLOROTHIAZIDE 25 MG PO TABS: 25.0000 mg | ORAL_TABLET | Freq: Every day | ORAL | 1 refills | Status: DC

## 2016-07-27 MED ORDER — ATORVASTATIN CALCIUM 80 MG PO TABS: 80.0000 mg | ORAL_TABLET | Freq: Every day | ORAL | 1 refills | Status: DC

## 2016-07-27 NOTE — Progress Notes (Signed)
Subjective/Complaints: No issues overnite pt ready to go home  Review of systems negative for chest pain, shortness breath, nausea, vomiting, diarrhea, positive constipation Objective: Vital Signs: Blood pressure 113/65, pulse 81, temperature 99.4 F (37.4 C), temperature source Oral, resp. rate 18, height 5' 5"  (1.651 m), weight 59.4 kg (130 lb 14.4 oz), SpO2 100 %. No results found. Results for orders placed or performed during the hospital encounter of 07/20/16 (from the past 72 hour(s))  Glucose, capillary     Status: Abnormal   Collection Time: 07/24/16 11:53 AM  Result Value Ref Range   Glucose-Capillary 107 (H) 65 - 99 mg/dL  Glucose, capillary     Status: Abnormal   Collection Time: 07/24/16  4:34 PM  Result Value Ref Range   Glucose-Capillary 101 (H) 65 - 99 mg/dL  Glucose, capillary     Status: Abnormal   Collection Time: 07/24/16  8:27 PM  Result Value Ref Range   Glucose-Capillary 108 (H) 65 - 99 mg/dL  Glucose, capillary     Status: Abnormal   Collection Time: 07/25/16  6:32 AM  Result Value Ref Range   Glucose-Capillary 103 (H) 65 - 99 mg/dL  Glucose, capillary     Status: Abnormal   Collection Time: 07/25/16 11:24 AM  Result Value Ref Range   Glucose-Capillary 123 (H) 65 - 99 mg/dL   Comment 1 Notify RN   Glucose, capillary     Status: None   Collection Time: 07/25/16  4:19 PM  Result Value Ref Range   Glucose-Capillary 95 65 - 99 mg/dL   Comment 1 Notify RN   Glucose, capillary     Status: None   Collection Time: 07/25/16  8:48 PM  Result Value Ref Range   Glucose-Capillary 91 65 - 99 mg/dL  Glucose, capillary     Status: None   Collection Time: 07/26/16  6:35 AM  Result Value Ref Range   Glucose-Capillary 96 65 - 99 mg/dL  Glucose, capillary     Status: Abnormal   Collection Time: 07/26/16 11:20 AM  Result Value Ref Range   Glucose-Capillary 114 (H) 65 - 99 mg/dL   Comment 1 Notify RN   Basic metabolic panel     Status: Abnormal   Collection  Time: 07/26/16  1:30 PM  Result Value Ref Range   Sodium 134 (L) 135 - 145 mmol/L   Potassium 3.9 3.5 - 5.1 mmol/L   Chloride 97 (L) 101 - 111 mmol/L   CO2 27 22 - 32 mmol/L   Glucose, Bld 103 (H) 65 - 99 mg/dL   BUN 19 6 - 20 mg/dL   Creatinine, Ser 1.14 (H) 0.44 - 1.00 mg/dL   Calcium 8.9 8.9 - 10.3 mg/dL   GFR calc non Af Amer 50 (L) >60 mL/min   GFR calc Af Amer 58 (L) >60 mL/min    Comment: (NOTE) The eGFR has been calculated using the CKD EPI equation. This calculation has not been validated in all clinical situations. eGFR's persistently <60 mL/min signify possible Chronic Kidney Disease.    Anion gap 10 5 - 15  Glucose, capillary     Status: Abnormal   Collection Time: 07/26/16  4:30 PM  Result Value Ref Range   Glucose-Capillary 103 (H) 65 - 99 mg/dL  Glucose, capillary     Status: Abnormal   Collection Time: 07/26/16  8:35 PM  Result Value Ref Range   Glucose-Capillary 123 (H) 65 - 99 mg/dL   Comment 1 Notify RN  Glucose, capillary     Status: Abnormal   Collection Time: 07/27/16  6:28 AM  Result Value Ref Range   Glucose-Capillary 102 (H) 65 - 99 mg/dL   Comment 1 Notify RN       General: No acute distress Mood and affect are appropriate Heart: Regular rate and rhythm no rubs murmurs or extra sounds Lungs: Clear to auscultation, breathing unlabored, no rales or wheezes Abdomen: Positive bowel sounds, soft nontender to palpation, nondistended, midline abdominal incision with Dermabond. No drainage Extremities: No clubbing, cyanosis, moderate left foot dorsal edema Skin: No evidence of breakdown, no evidence of rash,inguinal incisions CDI Neurologic: Cranial nerves II through XII intact, motor strength is 4/5 in bilateral deltoid, bicep, tricep, grip,4- hip flexor, knee extensors, ankle dorsiflexor and plantar flexor     Assessment/Plan: 1. Functional deficits secondary to deconditioning related to severe peripheral vascular disease requiring redo  femoropopliteal bypass  Stable for D/C today F/u PCP in 3-4 weeks F/u VVS 2 weeks See D/C summary See D/C instructions FIM: Function - Bathing Position: Shower Body parts bathed by patient: Right arm, Left arm, Chest, Abdomen, Front perineal area, Buttocks, Right upper leg, Left upper leg, Right lower leg, Left lower leg Body parts bathed by helper: Back Bathing not applicable: Back Assist Level: Set up Set up : To obtain items  Function- Upper Body Dressing/Undressing What is the patient wearing?: Pull over shirt/dress, Bra Bra - Perfomed by patient: Thread/unthread right bra strap, Thread/unthread left bra strap, Hook/unhook bra (pull down sports bra) Pull over shirt/dress - Perfomed by patient: Thread/unthread left sleeve, Put head through opening, Pull shirt over trunk, Thread/unthread right sleeve Pull over shirt/dress - Perfomed by helper: Thread/unthread right sleeve Assist Level: More than reasonable time Set up : To obtain clothing/put away Function - Lower Body Dressing/Undressing What is the patient wearing?: Underwear, Pants, Non-skid slipper socks Position: Wheelchair/chair at sink Underwear - Performed by patient: Thread/unthread right underwear leg, Pull underwear up/down, Thread/unthread left underwear leg Underwear - Performed by helper: Thread/unthread left underwear leg Pants- Performed by patient: Thread/unthread right pants leg, Thread/unthread left pants leg, Pull pants up/down Pants- Performed by helper: Thread/unthread left pants leg Non-skid slipper socks- Performed by patient: Don/doff left sock, Don/doff right sock Non-skid slipper socks- Performed by helper: Don/doff left sock Assist for footwear: Setup Assist for lower body dressing: Set up Set up : To obtain clothing/put away  Function - Toileting Toileting steps completed by patient: Adjust clothing prior to toileting, Performs perineal hygiene, Adjust clothing after toileting Toileting Assistive  Devices: Grab bar or rail Assist level: Set up/obtain supplies  Function - Toilet Transfers Toilet transfer assistive device: Grab bar Assist level to toilet: Touching or steadying assistance (Pt > 75%) Assist level from toilet: Touching or steadying assistance (Pt > 75%) Assist level to bedside commode (at bedside): 2 helpers Assist level from bedside commode (at bedside): Maximal assist (Pt 25 - 49%/lift and lower) (per Dorian Heckle, NT)  Function - Chair/bed transfer Chair/bed transfer method: Stand pivot, Ambulatory Chair/bed transfer assist level: Supervision or verbal cues Chair/bed transfer assistive device: Walker, Bedrails, Armrests Chair/bed transfer details: Verbal cues for precautions/safety  Function - Locomotion: Wheelchair Will patient use wheelchair at discharge?: Yes Type: Manual Wheelchair activity did not occur: Safety/medical concerns Max wheelchair distance: 150 Assist Level: Dependent (Pt equals 0%) Wheel 50 feet with 2 turns activity did not occur: Safety/medical concerns Assist Level: Dependent (Pt equals 0%) Wheel 150 feet activity did not occur: Safety/medical concerns Assist Level:  Dependent (Pt equals 0%) Turns around,maneuvers to table,bed, and toilet,negotiates 3% grade,maneuvers on rugs and over doorsills: No Function - Locomotion: Ambulation Assistive device: Walker-rolling Max distance: 78 ft Assist level: No help, No cues, assistive device, takes more than a reasonable amount of time Assist level: No help, No cues, assistive device, takes more than a reasonable amount of time Walk 50 feet with 2 turns activity did not occur: Safety/medical concerns Assist level: No help, No cues, assistive device, takes more than a reasonable amount of time Walk 150 feet activity did not occur: Safety/medical concerns Walk 10 feet on uneven surfaces activity did not occur: Safety/medical concerns Assist level: Supervision or verbal cues  Function -  Comprehension Comprehension: Auditory Comprehension assist level: Follows basic conversation/direction with no assist  Function - Expression Expression: Verbal Expression assist level: Expresses basic needs/ideas: With no assist  Function - Social Interaction Social Interaction assist level: Interacts appropriately 75 - 89% of the time - Needs redirection for appropriate language or to initiate interaction.  Function - Problem Solving Problem solving assist level: Solves basic problems with no assist  Function - Memory Memory assist level: Recognizes or recalls 90% of the time/requires cueing < 10% of the time Patient normally able to recall (first 3 days only): Current season, Location of own room, Staff names and faces, That he or she is in a hospital Medical Problem List and Plan: 1.  Debilitation secondary to redo aortobifemoral bypass graft and left femoral-popliteal/multi-medical D/C today 2.  DVT Prophylaxis/Anticoagulation: Subcutaneous heparin. Monitor platelet counts of any signs of bleeding 3. Pain Management: OxyContin 15 mg every 12 hours, oxycodone immediate release as needed 4. Acute on chronic anemia. Latest hemoglobin 10.1 5. Neuropsych: This patient is capable of making decisions on her own behalf. 6. Skin/Wound Care: Routine skin checks 7. Fluids/Electrolytes/Nutrition: Routine I&O with follow-up chemistries, kidney functions are normal. However, does have low albumin- Cont Pro Stat 8. History of CVA. Aspirin 81 mg daily 9. Hypertension. Coreg 3.125 mg twice a day, HCTZ 25 mg daily clonidine patch 0.2 mg weekly. Monitor with increased mobility 10. Impaired glucose intolerance. Hemoglobin A1c 6.2. Continue sliding scale. 11. Tobacco abuse. Counseling. Nicoderm patch. 12. Hyperlipidemia. Lipitor 13. Elevated total bilirubin. Hida scan unremarkable. . Follow-up chemistries  14. Hypokalemia. Improved but still borderline low will increase KCLto 104mq recheck potassium  in am,related to hydrochlorothiazide  LOS (Days) 7 A FACE TO FACE EVALUATION WAS PERFORMED  Wrangler Penning E 07/27/2016, 9:51 AM

## 2016-07-30 DIAGNOSIS — I2510 Atherosclerotic heart disease of native coronary artery without angina pectoris: Secondary | ICD-10-CM | POA: Diagnosis not present

## 2016-07-30 DIAGNOSIS — I10 Essential (primary) hypertension: Secondary | ICD-10-CM | POA: Diagnosis not present

## 2016-07-30 DIAGNOSIS — Z8673 Personal history of transient ischemic attack (TIA), and cerebral infarction without residual deficits: Secondary | ICD-10-CM | POA: Diagnosis not present

## 2016-07-30 DIAGNOSIS — F17210 Nicotine dependence, cigarettes, uncomplicated: Secondary | ICD-10-CM | POA: Diagnosis not present

## 2016-07-30 DIAGNOSIS — Z48812 Encounter for surgical aftercare following surgery on the circulatory system: Secondary | ICD-10-CM | POA: Diagnosis not present

## 2016-07-30 DIAGNOSIS — Z95828 Presence of other vascular implants and grafts: Secondary | ICD-10-CM | POA: Diagnosis not present

## 2016-07-30 DIAGNOSIS — I739 Peripheral vascular disease, unspecified: Secondary | ICD-10-CM | POA: Diagnosis not present

## 2016-07-30 DIAGNOSIS — M6281 Muscle weakness (generalized): Secondary | ICD-10-CM | POA: Diagnosis not present

## 2016-07-30 DIAGNOSIS — E785 Hyperlipidemia, unspecified: Secondary | ICD-10-CM | POA: Diagnosis not present

## 2016-08-01 DIAGNOSIS — Z95828 Presence of other vascular implants and grafts: Secondary | ICD-10-CM | POA: Diagnosis not present

## 2016-08-01 DIAGNOSIS — Z8673 Personal history of transient ischemic attack (TIA), and cerebral infarction without residual deficits: Secondary | ICD-10-CM | POA: Diagnosis not present

## 2016-08-01 DIAGNOSIS — I2510 Atherosclerotic heart disease of native coronary artery without angina pectoris: Secondary | ICD-10-CM | POA: Diagnosis not present

## 2016-08-01 DIAGNOSIS — F17210 Nicotine dependence, cigarettes, uncomplicated: Secondary | ICD-10-CM | POA: Diagnosis not present

## 2016-08-01 DIAGNOSIS — Z48812 Encounter for surgical aftercare following surgery on the circulatory system: Secondary | ICD-10-CM | POA: Diagnosis not present

## 2016-08-01 DIAGNOSIS — I10 Essential (primary) hypertension: Secondary | ICD-10-CM | POA: Diagnosis not present

## 2016-08-01 DIAGNOSIS — M6281 Muscle weakness (generalized): Secondary | ICD-10-CM | POA: Diagnosis not present

## 2016-08-01 DIAGNOSIS — I739 Peripheral vascular disease, unspecified: Secondary | ICD-10-CM | POA: Diagnosis not present

## 2016-08-01 DIAGNOSIS — E785 Hyperlipidemia, unspecified: Secondary | ICD-10-CM | POA: Diagnosis not present

## 2016-08-03 ENCOUNTER — Telehealth: Payer: Self-pay

## 2016-08-03 DIAGNOSIS — Z48812 Encounter for surgical aftercare following surgery on the circulatory system: Secondary | ICD-10-CM | POA: Diagnosis not present

## 2016-08-03 DIAGNOSIS — I10 Essential (primary) hypertension: Secondary | ICD-10-CM | POA: Diagnosis not present

## 2016-08-03 DIAGNOSIS — Z95828 Presence of other vascular implants and grafts: Secondary | ICD-10-CM | POA: Diagnosis not present

## 2016-08-03 DIAGNOSIS — E785 Hyperlipidemia, unspecified: Secondary | ICD-10-CM | POA: Diagnosis not present

## 2016-08-03 DIAGNOSIS — I2510 Atherosclerotic heart disease of native coronary artery without angina pectoris: Secondary | ICD-10-CM | POA: Diagnosis not present

## 2016-08-03 DIAGNOSIS — I739 Peripheral vascular disease, unspecified: Secondary | ICD-10-CM | POA: Diagnosis not present

## 2016-08-03 DIAGNOSIS — Z8673 Personal history of transient ischemic attack (TIA), and cerebral infarction without residual deficits: Secondary | ICD-10-CM | POA: Diagnosis not present

## 2016-08-03 DIAGNOSIS — M6281 Muscle weakness (generalized): Secondary | ICD-10-CM | POA: Diagnosis not present

## 2016-08-03 DIAGNOSIS — F17210 Nicotine dependence, cigarettes, uncomplicated: Secondary | ICD-10-CM | POA: Diagnosis not present

## 2016-08-03 NOTE — Telephone Encounter (Signed)
Noted. Please forward to [facility] for her appt tomorrow

## 2016-08-03 NOTE — Telephone Encounter (Signed)
Wendy Townsend with [facility] called to inform Dr.Carter patient is not eating well and it's possibly related to thrush build up on her tongue. Patient also off HCTZ and carvedilol x 2-3 days and will restart tomorrow.  Current B/P 148/88  Patient will be seen tomorrow by Sherrie Mustache, NP

## 2016-08-04 ENCOUNTER — Ambulatory Visit (INDEPENDENT_AMBULATORY_CARE_PROVIDER_SITE_OTHER): Payer: Commercial Managed Care - HMO | Admitting: Nurse Practitioner

## 2016-08-04 ENCOUNTER — Encounter: Payer: Self-pay | Admitting: Nurse Practitioner

## 2016-08-04 VITALS — BP 128/76 | HR 73 | Temp 97.6°F | Resp 17 | Ht 65.0 in | Wt 125.2 lb

## 2016-08-04 DIAGNOSIS — B370 Candidal stomatitis: Secondary | ICD-10-CM

## 2016-08-04 DIAGNOSIS — B3781 Candidal esophagitis: Secondary | ICD-10-CM | POA: Diagnosis not present

## 2016-08-04 MED ORDER — FLUCONAZOLE 150 MG PO TABS: ORAL_TABLET | ORAL | 0 refills | Status: DC

## 2016-08-04 MED ORDER — NYSTATIN 100000 UNIT/ML MT SUSP: 5.0000 mL | Freq: Four times a day (QID) | OROMUCOSAL | 0 refills | Status: DC

## 2016-08-04 NOTE — Patient Instructions (Signed)
To take diflucan 150 mg tablet today and then take another dose in 3 days Start nystatin solution- swish and swallow 4 times a day    Oral Thrush, Adult Oral thrush is an infection in your mouth and throat. It causes white patches on your tongue and in your mouth. Follow these instructions at home: Helping with soreness  To lessen your pain:  Drink cold liquids, like water and iced tea.  Eat frozen ice pops or frozen juices.  Eat foods that are easy to swallow, like gelatin and ice cream.  Drink from a straw if the patches in your mouth are painful. General instructions  Take or use over-the-counter and prescription medicines only as told by your doctor. Medicine for oral thrush may be something to swallow, or it may be something to put on the infected area.  Eat plain yogurt that has live cultures in it. Read the label to make sure.  If you wear dentures:  Take out your dentures before you go to bed.  Brush them well.  Soak them in a denture cleaner.  Rinse your mouth with warm salt-water many times a day. To make the salt-water mixture, completely dissolve 1/2-1 teaspoon of salt in 1 cup of warm water. Contact a doctor if:  Your problems are getting worse.  Your problems do not get better in less than 7 days with treatment.  Your infection is spreading. This may show as white patches on the skin outside of your mouth.  You are nursing your baby and you have redness and pain in the nipples. This information is not intended to replace advice given to you by your health care provider. Make sure you discuss any questions you have with your health care provider. Document Released: 11/30/2009 Document Revised: 05/30/2016 Document Reviewed: 05/30/2016 Elsevier Interactive Patient Education  2017 [employer].

## 2016-08-04 NOTE — Progress Notes (Signed)
Careteam: Patient Care Team: Gildardo Cranker, DO as PCP - General (Internal Medicine)  Advanced Directive information Does patient have an advance directive?: Yes, Type of Advance Directive: [place];Living will  Allergies  Allergen Reactions  . Penicillins Swelling    Has patient had a PCN reaction causing immediate rash, facial/tongue/throat swelling, SOB or lightheadedness with hypotension: YES Has patient had a PCN reaction causing severe rash involving mucus membranes or skin necrosis: NO Has patient had a PCN reaction that required hospitalization NO Has patient had a PCN reaction occurring within the last 10 years: NO If all of the above answers are "NO", then may proceed with Cephalosporin use.    Chief Complaint  Patient presents with  . Acute Visit    Possible thrush in mouth x 1 week. Pt says food taste different so she isn't eating anything except crackers for 2 days     HPI: Patient is a 65 y.o. female seen in the office today due to the possibility of thrush per home heath nurse. The patient had a recent infection in her left second toe for which she was treated with an antibiotic. The antibiotics have been completed. She has not eaten in two days, due to not liking the way food taste but has drank Boost.  Recent diarrhea in hospital, no diarrhea in 2 days.  Patient no longer smoking since being in the hospital.    Review of Systems:  Review of Systems  Constitutional: Positive for appetite change (Food just doesn't taste good.).  HENT: Positive for trouble swallowing ("hard to swallow").   Respiratory: Negative for shortness of breath.   Cardiovascular: Negative for chest pain and palpitations.  Gastrointestinal: Positive for diarrhea (2 days ago, none since). Negative for abdominal pain, constipation, nausea and vomiting.  Genitourinary: Negative for dysuria.    Past Medical History:  Diagnosis Date  . Hyperlipidemia   . Hypertension     . Peripheral vascular disease ([place])    vein surgery, left leg  . Stroke [facility]) 2002   ongoing mild loss of feeling in left side, had to learn to write right-handed  . Tobacco abuse    Past Surgical History:  Procedure Laterality Date  . AORTA - BILATERAL FEMORAL ARTERY BYPASS GRAFT N/A 07/14/2016   Procedure: AORTOBIFEMORAL BYPASS GRAFT;  Surgeon: Waynetta Sandy, MD;  Location: [place];  Service: Vascular;  Laterality: N/A;  . CARDIAC CATHETERIZATION N/A 07/11/2016   Procedure: Left Heart Cath and Coronary Angiography;  Surgeon: Nelva Bush, MD;  Location: [place] CV LAB;  Service: Cardiovascular;  Laterality: N/A;  . FEMORAL-POPLITEAL BYPASS GRAFT Left 07/14/2016   Procedure: REDO BYPASS GRAFT FEMORAL-POPLITEAL ARTERY;  Surgeon: Waynetta Sandy, MD;  Location: [place];  Service: Vascular;  Laterality: Left;  . PERIPHERAL VASCULAR CATHETERIZATION N/A 07/08/2016   Procedure: Abdominal Aortogram w/ Bilateral Lower Extremity Runoff;  Surgeon: Elam Dutch, MD;  Location: [place] [facility];  Service: Cardiovascular;  Laterality: N/A;  . VEIN SURGERY Left    Social History:   reports that she has quit smoking. Her smoking use included Cigarettes. She quit after 25.00 years of use. She has never used smokeless tobacco. She reports that she drinks alcohol. She reports that she uses drugs, including Marijuana.  Family History  Problem Relation Age of Onset  . Hypertension Mother 49    Medications: Patient's Medications  New Prescriptions   No medications on file  Previous Medications   ASPIRIN EC 81 MG  TABLET    TAKE 1 TABLET (325 MG TOTAL) BY MOUTH DAILY.   ATORVASTATIN (LIPITOR) 80 MG TABLET    Take 1 tablet (80 mg total) by mouth daily at 6 PM.   CARVEDILOL (COREG) 3.125 MG TABLET    Take 1 tablet (3.125 mg total) by mouth 2 (two) times daily with a meal.   CLONIDINE (CATAPRES - DOSED IN MG/24 HR) 0.2 MG/24HR PATCH    Place 1 patch (0.2 mg total) onto the  skin once a week.   FAMOTIDINE (PEPCID) 20 MG TABLET    Take 1 tablet (20 mg total) by mouth 2 (two) times daily.   HYDROCHLOROTHIAZIDE (HYDRODIURIL) 25 MG TABLET    Take 1 tablet (25 mg total) by mouth daily.   OXYCODONE (OXYCONTIN) 10 MG 12 HR TABLET    Take 1 tablet (10 mg total) by mouth every 12 (twelve) hours.   POTASSIUM CHLORIDE (MICRO-K) 10 MEQ CR CAPSULE    Take 1 capsule (10 mEq total) by mouth daily.   SACCHAROMYCES BOULARDII (FLORASTOR) 250 MG CAPSULE    Take 1 capsule (250 mg total) by mouth 2 (two) times daily.  Modified Medications   No medications on file  Discontinued Medications   NICOTINE (NICODERM CQ - DOSED IN MG/24 HR) 7 MG/24HR PATCH    7 mg patch daily 2 weeks and stop   OXYCODONE 10 MG TABS    Take 1 tablet (10 mg total) by mouth every 4 (four) hours as needed for moderate pain.     Physical Exam:  Vitals:   08/04/16 1200  BP: 128/76  Pulse: 73  Resp: 17  Temp: 97.6 F (36.4 C)  TempSrc: Oral  SpO2: 96%  Weight: 125 lb 3.2 oz (56.8 kg)  Height: 5\' 5"  (1.651 m)   Body mass index is 20.83 kg/m.  Physical Exam  Constitutional: She is oriented to person, place, and time. She appears well-developed and well-nourished.  HENT:  Mouth/Throat: Oropharynx is clear and moist. No oropharyngeal exudate.  Thick white coating to tongue and increase secretions   Eyes: Pupils are equal, round, and reactive to light. No scleral icterus.  Neck: Neck supple. Carotid bruit is not present.  Cardiovascular: Normal rate, regular rhythm and intact distal pulses.  Exam reveals no gallop and no friction rub.   Murmur (1/6 SEM) heard. No LE edema b/l. no calf TTP.   Pulmonary/Chest: Effort normal and breath sounds normal. No respiratory distress.  Abdominal: Soft. Bowel sounds are normal. She exhibits no distension and no mass. There is no hepatomegaly. There is no tenderness. There is no rebound and no guarding.  Lymphadenopathy:    She has no cervical adenopathy.   Neurological: She is alert and oriented to person, place, and time. Gait abnormal.  Left hemiparesis  Skin: Skin is warm and dry.  Psychiatric: She has a normal mood and affect. Her speech is slurred.   Labs reviewed: Basic Metabolic Panel:  Recent Labs  06/08/16 1008  07/15/16 0426  07/18/16 0840 07/19/16 1043 07/20/16 1520 07/24/16 0448 07/26/16 1330  NA 138  < >  --   < > 140 138 136 135 134*  K 3.9  < >  --   < > 3.7 3.4* 3.1* 3.4* 3.9  CL 103  < >  --   < > 110 103 99* 97* 97*  CO2 23  < >  --   < > 22 25 26 28 27   GLUCOSE 101*  < >  --   < >  115* 96 98 90 103*  BUN 35*  < >  --   < > 9 6 8 13 19   CREATININE 1.52*  < >  --   < > 0.91 0.95 1.03* 1.07* 1.14*  CALCIUM 9.9  < >  --   < > 8.2* 8.3* 8.3* 8.2* 8.9  MG  --   < > 1.4*  --  1.4* 1.5*  --   --   --   PHOS  --   --  3.8  --  1.7*  --   --   --   --   TSH 0.35*  --   --   --   --   --   --   --   --   < > = values in this interval not displayed. Liver Function Tests:  Recent Labs  07/18/16 0840 07/19/16 1043 07/20/16 1520  AST 72* 49* 68*  ALT 49 40 48  ALKPHOS 93 94 112  BILITOT 4.2* 3.7* 4.2*  PROT 5.2* 5.1* 5.4*  ALBUMIN 2.4* 2.5* 2.5*    Recent Labs  07/15/16 0426 07/18/16 0840  LIPASE  --  31  AMYLASE 55  --    No results for input(s): AMMONIA in the last 8760 hours. CBC:  Recent Labs  07/05/16 2015  07/17/16 0221 07/18/16 0840 07/20/16 0420 07/20/16 1520  WBC 9.4  < > 16.4* 13.4* 12.8* 12.2*  NEUTROABS 6.2  --  12.8*  --   --  7.7  HGB 13.0  < > 7.0* 10.1* 10.2* 10.8*  HCT 38.0  < > 20.3* 30.7* 30.0* 31.4*  MCV 90.3  < > 82.2 86.2 83.8 84.0  PLT 315  < > 135* 159 214 208  < > = values in this interval not displayed. Lipid Panel:  Recent Labs  06/08/16 1008 07/14/16 2042  CHOL 97*  --   HDL 53  --   LDLCALC 32  --   TRIG 59 58  CHOLHDL 1.8  --    TSH:  Recent Labs  06/08/16 1008  TSH 0.35*   A1C: Lab Results  Component Value Date   HGBA1C 6.2 (H) 07/08/2016      Assessment/Plan 1. Thrush of mouth and esophagus (HCC) - fluconazole (DIFLUCAN) 150 MG tablet; Take 1 tablet today and repeat in 3 days  Dispense: 2 tablet; Refill: 0 - nystatin (MYCOSTATIN) 100000 UNIT/ML suspension; Take 5 mLs (500,000 Units total) by mouth 4 (four) times daily.  Dispense: 60 mL; Refill: 0  To keep follow up with Dr Eulas Post next week, notify sooner if needed   Janett Billow K. Harle Battiest  [facility] [phone] 8 am - 5 pm) [phone] (after hours)

## 2016-08-05 DIAGNOSIS — I739 Peripheral vascular disease, unspecified: Secondary | ICD-10-CM | POA: Diagnosis not present

## 2016-08-05 DIAGNOSIS — F17210 Nicotine dependence, cigarettes, uncomplicated: Secondary | ICD-10-CM | POA: Diagnosis not present

## 2016-08-05 DIAGNOSIS — Z8673 Personal history of transient ischemic attack (TIA), and cerebral infarction without residual deficits: Secondary | ICD-10-CM | POA: Diagnosis not present

## 2016-08-05 DIAGNOSIS — I2510 Atherosclerotic heart disease of native coronary artery without angina pectoris: Secondary | ICD-10-CM | POA: Diagnosis not present

## 2016-08-05 DIAGNOSIS — I10 Essential (primary) hypertension: Secondary | ICD-10-CM | POA: Diagnosis not present

## 2016-08-05 DIAGNOSIS — E785 Hyperlipidemia, unspecified: Secondary | ICD-10-CM | POA: Diagnosis not present

## 2016-08-05 DIAGNOSIS — Z48812 Encounter for surgical aftercare following surgery on the circulatory system: Secondary | ICD-10-CM | POA: Diagnosis not present

## 2016-08-05 DIAGNOSIS — Z95828 Presence of other vascular implants and grafts: Secondary | ICD-10-CM | POA: Diagnosis not present

## 2016-08-05 DIAGNOSIS — M6281 Muscle weakness (generalized): Secondary | ICD-10-CM | POA: Diagnosis not present

## 2016-08-06 DIAGNOSIS — I10 Essential (primary) hypertension: Secondary | ICD-10-CM | POA: Diagnosis not present

## 2016-08-06 DIAGNOSIS — E785 Hyperlipidemia, unspecified: Secondary | ICD-10-CM | POA: Diagnosis not present

## 2016-08-06 DIAGNOSIS — Z95828 Presence of other vascular implants and grafts: Secondary | ICD-10-CM | POA: Diagnosis not present

## 2016-08-06 DIAGNOSIS — I739 Peripheral vascular disease, unspecified: Secondary | ICD-10-CM | POA: Diagnosis not present

## 2016-08-06 DIAGNOSIS — Z48812 Encounter for surgical aftercare following surgery on the circulatory system: Secondary | ICD-10-CM | POA: Diagnosis not present

## 2016-08-06 DIAGNOSIS — I2510 Atherosclerotic heart disease of native coronary artery without angina pectoris: Secondary | ICD-10-CM | POA: Diagnosis not present

## 2016-08-06 DIAGNOSIS — Z8673 Personal history of transient ischemic attack (TIA), and cerebral infarction without residual deficits: Secondary | ICD-10-CM | POA: Diagnosis not present

## 2016-08-06 DIAGNOSIS — M6281 Muscle weakness (generalized): Secondary | ICD-10-CM | POA: Diagnosis not present

## 2016-08-06 DIAGNOSIS — F17210 Nicotine dependence, cigarettes, uncomplicated: Secondary | ICD-10-CM | POA: Diagnosis not present

## 2016-08-08 DIAGNOSIS — I2510 Atherosclerotic heart disease of native coronary artery without angina pectoris: Secondary | ICD-10-CM | POA: Diagnosis not present

## 2016-08-08 DIAGNOSIS — Z48812 Encounter for surgical aftercare following surgery on the circulatory system: Secondary | ICD-10-CM | POA: Diagnosis not present

## 2016-08-08 DIAGNOSIS — M6281 Muscle weakness (generalized): Secondary | ICD-10-CM | POA: Diagnosis not present

## 2016-08-08 DIAGNOSIS — I739 Peripheral vascular disease, unspecified: Secondary | ICD-10-CM | POA: Diagnosis not present

## 2016-08-08 DIAGNOSIS — E785 Hyperlipidemia, unspecified: Secondary | ICD-10-CM | POA: Diagnosis not present

## 2016-08-08 DIAGNOSIS — F17210 Nicotine dependence, cigarettes, uncomplicated: Secondary | ICD-10-CM | POA: Diagnosis not present

## 2016-08-08 DIAGNOSIS — Z95828 Presence of other vascular implants and grafts: Secondary | ICD-10-CM | POA: Diagnosis not present

## 2016-08-08 DIAGNOSIS — Z8673 Personal history of transient ischemic attack (TIA), and cerebral infarction without residual deficits: Secondary | ICD-10-CM | POA: Diagnosis not present

## 2016-08-08 DIAGNOSIS — I10 Essential (primary) hypertension: Secondary | ICD-10-CM | POA: Diagnosis not present

## 2016-08-09 DIAGNOSIS — F17210 Nicotine dependence, cigarettes, uncomplicated: Secondary | ICD-10-CM | POA: Diagnosis not present

## 2016-08-09 DIAGNOSIS — Z8673 Personal history of transient ischemic attack (TIA), and cerebral infarction without residual deficits: Secondary | ICD-10-CM | POA: Diagnosis not present

## 2016-08-09 DIAGNOSIS — I10 Essential (primary) hypertension: Secondary | ICD-10-CM | POA: Diagnosis not present

## 2016-08-09 DIAGNOSIS — Z95828 Presence of other vascular implants and grafts: Secondary | ICD-10-CM | POA: Diagnosis not present

## 2016-08-09 DIAGNOSIS — I2510 Atherosclerotic heart disease of native coronary artery without angina pectoris: Secondary | ICD-10-CM | POA: Diagnosis not present

## 2016-08-09 DIAGNOSIS — M6281 Muscle weakness (generalized): Secondary | ICD-10-CM | POA: Diagnosis not present

## 2016-08-09 DIAGNOSIS — E785 Hyperlipidemia, unspecified: Secondary | ICD-10-CM | POA: Diagnosis not present

## 2016-08-09 DIAGNOSIS — Z48812 Encounter for surgical aftercare following surgery on the circulatory system: Secondary | ICD-10-CM | POA: Diagnosis not present

## 2016-08-09 DIAGNOSIS — I739 Peripheral vascular disease, unspecified: Secondary | ICD-10-CM | POA: Diagnosis not present

## 2016-08-10 ENCOUNTER — Ambulatory Visit (INDEPENDENT_AMBULATORY_CARE_PROVIDER_SITE_OTHER): Payer: Commercial Managed Care - HMO | Admitting: Internal Medicine

## 2016-08-10 ENCOUNTER — Encounter: Payer: Self-pay | Admitting: Internal Medicine

## 2016-08-10 VITALS — BP 124/72 | HR 92 | Temp 97.1°F | Ht 65.0 in | Wt 123.4 lb

## 2016-08-10 DIAGNOSIS — N182 Chronic kidney disease, stage 2 (mild): Secondary | ICD-10-CM | POA: Diagnosis not present

## 2016-08-10 DIAGNOSIS — G8194 Hemiplegia, unspecified affecting left nondominant side: Secondary | ICD-10-CM

## 2016-08-10 DIAGNOSIS — D62 Acute posthemorrhagic anemia: Secondary | ICD-10-CM

## 2016-08-10 DIAGNOSIS — B3781 Candidal esophagitis: Secondary | ICD-10-CM

## 2016-08-10 DIAGNOSIS — B370 Candidal stomatitis: Secondary | ICD-10-CM

## 2016-08-10 DIAGNOSIS — Z87891 Personal history of nicotine dependence: Secondary | ICD-10-CM | POA: Diagnosis not present

## 2016-08-10 DIAGNOSIS — E785 Hyperlipidemia, unspecified: Secondary | ICD-10-CM | POA: Diagnosis not present

## 2016-08-10 DIAGNOSIS — I739 Peripheral vascular disease, unspecified: Secondary | ICD-10-CM

## 2016-08-10 DIAGNOSIS — Z8673 Personal history of transient ischemic attack (TIA), and cerebral infarction without residual deficits: Secondary | ICD-10-CM

## 2016-08-10 DIAGNOSIS — I10 Essential (primary) hypertension: Secondary | ICD-10-CM

## 2016-08-10 DIAGNOSIS — R17 Unspecified jaundice: Secondary | ICD-10-CM

## 2016-08-10 NOTE — Patient Instructions (Signed)
Continue current medications as ordered  Follow up with specialists as scheduled. Need appt with vascular surgery to follow up on surgery at hospital  Will call with lab results  Follow up in 3 mos for routine visit

## 2016-08-10 NOTE — Progress Notes (Signed)
Patient ID: Wendy Townsend, female   DOB: 03/16/1951, 65 y.o.   MRN: [id number]    Location:  [facility] Place of Service: OFFICE  Chief Complaint  Patient presents with  . Follow-up    check on toe    HPI:  65 yo female seen today for f/u. Since her last OV, she was admitted to the hospital in Oct 2017 for dry gangrene left foot resulting in aortobifem bypass and redo left femoral exposure and left fem-pop bypass on 10/26th. Left heart cath mild-mod nonobstructive CAD. She was given 2 Units PRBCs for postop anemia. Total bilirubin elevated to 4.2 with nml HIDA scan. A1c 6.2%. Hgb 10.8; WBC 12.2K; albumin 2.5.  Today, she reports left foot feeling much better. Rarely has pain and no longer takes narcotic. She does not have a f/u appt with vascular sx yet. She is scheduled to see podiatry 11/30th. She completed nystatin susp and diflucan Rx last week by NP-C. thrush resolved  PAD - followed by cardio Dr Nahser. She takes ASA daily. She no longer has pain in her legs with ambulation that resolves with rest. No rest limb pain. She is no longer smoking 3 cigs per day as of Oct 2017.  Hx CVA - stable. Takes ASA daily  HTN - BP stable on coreg, clonidine and HCTZ. Followed by cardio  CAD - stable on ASA daily and BB. LHC shows mild-mod nonobstructive CAD with nml EF  Hypokalemia - stable on Micro-K. Last K+ 3.9  Hyperlipidemia - stable on lipitor. LDL 70; HDL 40  CKD - stage 2. Cr 1.14  Chronic tobacco abuse - smoked 2-3 cigs/day and had been smoking x > 30 yrs. She quit while in hospital last month.    Past Medical History:  Diagnosis Date  . Hyperlipidemia   . Hypertension   . Peripheral vascular disease (HCC)    vein surgery, left leg  . Stroke (HCC) 2002   ongoing mild loss of feeling in left side, had to learn to write right-handed  . Tobacco abuse     Past Surgical History:  Procedure Laterality Date  . AORTA - BILATERAL FEMORAL ARTERY BYPASS GRAFT N/A 07/14/2016   Procedure: AORTOBIFEMORAL BYPASS GRAFT;  Surgeon: Brandon Christopher Cain, MD;  Location: [facility];  Service: Vascular;  Laterality: N/A;  . CARDIAC CATHETERIZATION N/A 07/11/2016   Procedure: Left Heart Cath and Coronary Angiography;  Surgeon: Christopher End, MD;  Location: [facility] INVASIVE CV LAB;  Service: Cardiovascular;  Laterality: N/A;  . FEMORAL-POPLITEAL BYPASS GRAFT Left 07/14/2016   Procedure: REDO BYPASS GRAFT FEMORAL-POPLITEAL ARTERY;  Surgeon: Brandon Christopher Cain, MD;  Location: [facility] OR;  Service: Vascular;  Laterality: Left;  . PERIPHERAL VASCULAR CATHETERIZATION N/A 07/08/2016   Procedure: Abdominal Aortogram w/ Bilateral Lower Extremity Runoff;  Surgeon: Charles E Fields, MD;  Location: [facility];  Service: Cardiovascular;  Laterality: N/A;  . VEIN SURGERY Left     Patient Care Team:  , DO as PCP - General (Internal Medicine)  Social History   Social History  . Marital status: Widowed    Spouse name: N/A  . Number of children: N/A  . Years of education: N/A   Occupational History  . retired    Social History Main Topics  . Smoking status: Former Smoker    Years: 25.00    Types: Cigarettes  . Smokeless tobacco: Never Used     Comment: Has not smoked since being discharged from hospital   . Alcohol use Yes       Comment: every now and then, last drink was maybe in July  . Drug use:     Types: Marijuana  . Sexual activity: Not Currently   Other Topics Concern  . Not on file   Social History Narrative   DIET: None      DO YOU DRINK/EAT THINGS WITH CAFFEINE: no      MARITAL STATUS: Widow      WHAT YEAR WERE YOU MARRIED: 1990      DO YOU LIVE IN A HOUSE, APARTMENT, ASSISTED LIVING, CONDO TRAILER ETC.:  House      IS IT ONE OR MORE STORIES: One      HOW MANY PERSONS LIVE IN YOUR HOME: 3      DO YOU HAVE PETS IN YOUR HOME: no      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      DO YOU EXERCISE: sometimes      WHAT TYPE AND HOW OFTEN: once a  week     reports that she has quit smoking. Her smoking use included Cigarettes. She quit after 25.00 years of use. She has never used smokeless tobacco. She reports that she drinks alcohol. She reports that she uses drugs, including Marijuana.  Family History  Problem Relation Age of Onset  . Hypertension Mother 90   Family Status  Relation Status  . Father Deceased at age 72   Unknown  . Mother Deceased at age 90   Nautural Death  . Sister Alive  . Brother Deceased  . Sister Alive  . Sister Deceased  . Sister Deceased  . Brother Alive  . Brother Deceased  . Sister Alive  . Sister Alive     Allergies  Allergen Reactions  . Penicillins Swelling    Has patient had a PCN reaction causing immediate rash, facial/tongue/throat swelling, SOB or lightheadedness with hypotension: YES Has patient had a PCN reaction causing severe rash involving mucus membranes or skin necrosis: NO Has patient had a PCN reaction that required hospitalization NO Has patient had a PCN reaction occurring within the last 10 years: NO If all of the above answers are "NO", then may proceed with Cephalosporin use.    Medications: Patient's Medications  New Prescriptions   No medications on file  Previous Medications   ASPIRIN EC 81 MG TABLET    TAKE 1 TABLET (325 MG TOTAL) BY MOUTH DAILY.   ATORVASTATIN (LIPITOR) 80 MG TABLET    Take 1 tablet (80 mg total) by mouth daily at 6 PM.   CARVEDILOL (COREG) 3.125 MG TABLET    Take 1 tablet (3.125 mg total) by mouth 2 (two) times daily with a meal.   CLONIDINE (CATAPRES - DOSED IN MG/24 HR) 0.2 MG/24HR PATCH    Place 1 patch (0.2 mg total) onto the skin once a week.   FAMOTIDINE (PEPCID) 20 MG TABLET    Take 1 tablet (20 mg total) by mouth 2 (two) times daily.   FLUCONAZOLE (DIFLUCAN) 150 MG TABLET    Take 1 tablet today and repeat in 3 days   HYDROCHLOROTHIAZIDE (HYDRODIURIL) 25 MG TABLET    Take 1 tablet (25 mg total) by mouth daily.   NYSTATIN (MYCOSTATIN)  100000 UNIT/ML SUSPENSION    Take 5 mLs (500,000 Units total) by mouth 4 (four) times daily.   OXYCODONE (OXYCONTIN) 10 MG 12 HR TABLET    Take 1 tablet (10 mg total) by mouth every 12 (twelve) hours.   POTASSIUM CHLORIDE (MICRO-K) 10 MEQ CR CAPSULE      Take 1 capsule (10 mEq total) by mouth daily.   SACCHAROMYCES BOULARDII (FLORASTOR) 250 MG CAPSULE    Take 1 capsule (250 mg total) by mouth 2 (two) times daily.  Modified Medications   No medications on file  Discontinued Medications   No medications on file    Review of Systems  Musculoskeletal: Positive for arthralgias, gait problem (uses 4 prong cane to ambulate) and joint swelling.  Skin: Positive for wound.  Neurological: Positive for weakness and numbness.  All other systems reviewed and are negative.   Vitals:   08/10/16 1416  BP: 124/72  Pulse: 92  Temp: 97.1 F (36.2 C)  TempSrc: Oral  SpO2: 94%  Weight: 123 lb 6.4 oz (56 kg)  Height: 5' 5" (1.651 m)   Body mass index is 20.53 kg/m.  Physical Exam  Constitutional: She is oriented to person, place, and time. She appears well-developed and well-nourished.  HENT:  Mouth/Throat: Oropharynx is clear and moist. No oropharyngeal exudate.  Eyes: Pupils are equal, round, and reactive to light. No scleral icterus.  Neck: Neck supple. Carotid bruit is not present. No tracheal deviation present. No thyromegaly present.  Cardiovascular: Normal rate, regular rhythm and intact distal pulses.  Exam reveals no gallop and no friction rub.   Murmur (1/6 SEM) heard. Pulses:      Dorsalis pedis pulses are 2+ on the right side, and 2+ on the left side.       Posterior tibial pulses are 2+ on the right side, and 2+ on the left side.  No RLE edema but has +1 pitting LLE edema without calf TTP.   Pulmonary/Chest: Effort normal and breath sounds normal. No stridor. No respiratory distress. She has no wheezes. She has no rales.  Abdominal: Soft. Bowel sounds are normal. She exhibits no  distension and no mass. There is no hepatomegaly. There is no tenderness. There is no rebound and no guarding.  Midline surgical incision healing well with glue still present but no signs of dehiscence, d/c, redness or TTP  Musculoskeletal: She exhibits edema and tenderness.  Left medial leg proximal incision healing well with no signs of dehiscence or secondary infection. Surgical glue still present but no redness, d/c or TTP  Lymphadenopathy:    She has no cervical adenopathy.  Neurological: She is alert and oriented to person, place, and time. She exhibits abnormal muscle tone (LLE >LUE). Gait abnormal.  Left hemiparesis  Skin: Skin is warm and dry. No rash noted.   left 2nd toe without d/c, redness, NT. No blood.(+) swelling of left foot-->toes. Significant nail hypertrophy and dystrophic changes  Psychiatric: She has a normal mood and affect. Her behavior is normal. Thought content normal. Her speech is slurred.     Labs reviewed: Admission on 07/20/2016, Discharged on 07/27/2016  Component Date Value Ref Range Status  . WBC 07/20/2016 12.2* 4.0 - 10.5 K/uL Final  . RBC 07/20/2016 3.74* 3.87 - 5.11 MIL/uL Final  . Hemoglobin 07/20/2016 10.8* 12.0 - 15.0 g/dL Final  . HCT 07/20/2016 31.4* 36.0 - 46.0 % Final  . MCV 07/20/2016 84.0  78.0 - 100.0 fL Final  . MCH 07/20/2016 28.9  26.0 - 34.0 pg Final  . MCHC 07/20/2016 34.4  30.0 - 36.0 g/dL Final  . RDW 07/20/2016 16.4* 11.5 - 15.5 % Final  . Platelets 07/20/2016 208  150 - 400 K/uL Final  . Neutrophils Relative % 07/20/2016 63  % Final  . Lymphocytes Relative 07/20/2016 25  % Final  .   Monocytes Relative 07/20/2016 11  % Final  . Eosinophils Relative 07/20/2016 1  % Final  . Basophils Relative 07/20/2016 0  % Final  . Neutro Abs 07/20/2016 7.7  1.7 - 7.7 K/uL Final  . Lymphs Abs 07/20/2016 3.1  0.7 - 4.0 K/uL Final  . Monocytes Absolute 07/20/2016 1.3* 0.1 - 1.0 K/uL Final  . Eosinophils Absolute 07/20/2016 0.1  0.0 - 0.7 K/uL  Final  . Basophils Absolute 07/20/2016 0.0  0.0 - 0.1 K/uL Final  . RBC Morphology 07/20/2016 POLYCHROMASIA PRESENT   Final  . Sodium 07/20/2016 136  135 - 145 mmol/L Final  . Potassium 07/20/2016 3.1* 3.5 - 5.1 mmol/L Final  . Chloride 07/20/2016 99* 101 - 111 mmol/L Final  . CO2 07/20/2016 26  22 - 32 mmol/L Final  . Glucose, Bld 07/20/2016 98  65 - 99 mg/dL Final  . BUN 07/20/2016 8  6 - 20 mg/dL Final  . Creatinine, Ser 07/20/2016 1.03* 0.44 - 1.00 mg/dL Final  . Calcium 07/20/2016 8.3* 8.9 - 10.3 mg/dL Final  . Total Protein 07/20/2016 5.4* 6.5 - 8.1 g/dL Final  . Albumin 07/20/2016 2.5* 3.5 - 5.0 g/dL Final  . AST 07/20/2016 68* 15 - 41 U/L Final  . ALT 07/20/2016 48  14 - 54 U/L Final  . Alkaline Phosphatase 07/20/2016 112  38 - 126 U/L Final  . Total Bilirubin 07/20/2016 4.2* 0.3 - 1.2 mg/dL Final  . GFR calc non Af Amer 07/20/2016 56* >60 mL/min Final  . GFR calc Af Amer 07/20/2016 >60  >60 mL/min Final   Comment: (NOTE) The eGFR has been calculated using the CKD EPI equation. This calculation has not been validated in all clinical situations. eGFR's persistently <60 mL/min signify possible Chronic Kidney Disease.   . Anion gap 07/20/2016 11  5 - 15 Final  . Glucose-Capillary 07/20/2016 94  65 - 99 mg/dL Final  . Comment 1 07/20/2016 Notify RN   Final  . Glucose-Capillary 07/20/2016 106* 65 - 99 mg/dL Final  . Glucose-Capillary 07/21/2016 91  65 - 99 mg/dL Final  . Glucose-Capillary 07/21/2016 116* 65 - 99 mg/dL Final  . Comment 1 07/21/2016 Notify RN   Final  . Glucose-Capillary 07/21/2016 101* 65 - 99 mg/dL Final  . Glucose-Capillary 07/21/2016 113* 65 - 99 mg/dL Final  . Glucose-Capillary 07/22/2016 92  65 - 99 mg/dL Final  . Glucose-Capillary 07/22/2016 116* 65 - 99 mg/dL Final  . Glucose-Capillary 07/22/2016 103* 65 - 99 mg/dL Final  . Glucose-Capillary 07/22/2016 107* 65 - 99 mg/dL Final  . Glucose-Capillary 07/23/2016 105* 65 - 99 mg/dL Final  .  Glucose-Capillary 07/23/2016 123* 65 - 99 mg/dL Final  . Glucose-Capillary 07/23/2016 87  65 - 99 mg/dL Final  . Sodium 07/24/2016 135  135 - 145 mmol/L Final  . Potassium 07/24/2016 3.4* 3.5 - 5.1 mmol/L Final  . Chloride 07/24/2016 97* 101 - 111 mmol/L Final  . CO2 07/24/2016 28  22 - 32 mmol/L Final  . Glucose, Bld 07/24/2016 90  65 - 99 mg/dL Final  . BUN 07/24/2016 13  6 - 20 mg/dL Final  . Creatinine, Ser 07/24/2016 1.07* 0.44 - 1.00 mg/dL Final  . Calcium 07/24/2016 8.2* 8.9 - 10.3 mg/dL Final  . GFR calc non Af Amer 07/24/2016 54* >60 mL/min Final  . GFR calc Af Amer 07/24/2016 >60  >60 mL/min Final   Comment: (NOTE) The eGFR has been calculated using the CKD EPI equation. This calculation has not been validated  in all clinical situations. eGFR's persistently <60 mL/min signify possible Chronic Kidney Disease.   . Anion gap 07/24/2016 10  5 - 15 Final  . Glucose-Capillary 07/23/2016 106* 65 - 99 mg/dL Final  . Glucose-Capillary 07/24/2016 101* 65 - 99 mg/dL Final  . Glucose-Capillary 07/24/2016 107* 65 - 99 mg/dL Final  . Glucose-Capillary 07/24/2016 101* 65 - 99 mg/dL Final  . Glucose-Capillary 07/24/2016 108* 65 - 99 mg/dL Final  . Glucose-Capillary 07/25/2016 103* 65 - 99 mg/dL Final  . Glucose-Capillary 07/25/2016 123* 65 - 99 mg/dL Final  . Comment 1 07/25/2016 Notify RN   Final  . Glucose-Capillary 07/25/2016 95  65 - 99 mg/dL Final  . Comment 1 07/25/2016 Notify RN   Final  . Glucose-Capillary 07/25/2016 91  65 - 99 mg/dL Final  . Glucose-Capillary 07/26/2016 96  65 - 99 mg/dL Final  . Sodium 07/26/2016 134* 135 - 145 mmol/L Final  . Potassium 07/26/2016 3.9  3.5 - 5.1 mmol/L Final  . Chloride 07/26/2016 97* 101 - 111 mmol/L Final  . CO2 07/26/2016 27  22 - 32 mmol/L Final  . Glucose, Bld 07/26/2016 103* 65 - 99 mg/dL Final  . BUN 07/26/2016 19  6 - 20 mg/dL Final  . Creatinine, Ser 07/26/2016 1.14* 0.44 - 1.00 mg/dL Final  . Calcium 07/26/2016 8.9  8.9 - 10.3  mg/dL Final  . GFR calc non Af Amer 07/26/2016 50* >60 mL/min Final  . GFR calc Af Amer 07/26/2016 58* >60 mL/min Final   Comment: (NOTE) The eGFR has been calculated using the CKD EPI equation. This calculation has not been validated in all clinical situations. eGFR's persistently <60 mL/min signify possible Chronic Kidney Disease.   . Anion gap 07/26/2016 10  5 - 15 Final  . Glucose-Capillary 07/26/2016 114* 65 - 99 mg/dL Final  . Comment 1 07/26/2016 Notify RN   Final  . Glucose-Capillary 07/26/2016 103* 65 - 99 mg/dL Final  . Glucose-Capillary 07/26/2016 123* 65 - 99 mg/dL Final  . Comment 1 07/26/2016 Notify RN   Final  . Glucose-Capillary 07/27/2016 102* 65 - 99 mg/dL Final  . Comment 1 07/27/2016 Notify RN   Final  . Glucose-Capillary 07/27/2016 109* 65 - 99 mg/dL Final  . Comment 1 07/27/2016 Notify RN   Final  Admission on 07/05/2016, Discharged on 07/20/2016  No results displayed because visit has over 200 results.    Orders Only on 06/08/2016  Component Date Value Ref Range Status  . GGT 06/10/2016 26  7 - 51 U/L Final  Office Visit on 06/08/2016  Component Date Value Ref Range Status  . HM Mammogram 09/19/2004 Self Reported Normal  0-4 Bi-Rad, Self Reported Normal Final  . HM Pap smear 09/19/2009 normal   Final  . WBC 06/08/2016 9.7  3.8 - 10.8 K/uL Final  . RBC 06/08/2016 4.33  3.80 - 5.10 MIL/uL Final  . Hemoglobin 06/08/2016 13.4  11.7 - 15.5 g/dL Final  . HCT 06/08/2016 39.5  35.0 - 45.0 % Final  . MCV 06/08/2016 91.2  80.0 - 100.0 fL Final  . MCH 06/08/2016 30.9  27.0 - 33.0 pg Final  . MCHC 06/08/2016 33.9  32.0 - 36.0 g/dL Final  . RDW 06/08/2016 12.9  11.0 - 15.0 % Final  . Platelets 06/08/2016 258  140 - 400 K/uL Final  . MPV 06/08/2016 10.4  7.5 - 12.5 fL Final  . Neutro Abs 06/08/2016 4365  1,500 - 7,800 cells/uL Final  . Lymphs Abs 06/08/2016 4171*  850 - 3,900 cells/uL Final  . Monocytes Absolute 06/08/2016 970* 200 - 950 cells/uL Final  . Eosinophils  Absolute 06/08/2016 97  15 - 500 cells/uL Final  . Basophils Absolute 06/08/2016 97  0 - 200 cells/uL Final  . Neutrophils Relative % 06/08/2016 45  % Final  . Lymphocytes Relative 06/08/2016 43  % Final  . Monocytes Relative 06/08/2016 10  % Final  . Eosinophils Relative 06/08/2016 1  % Final  . Basophils Relative 06/08/2016 1  % Final  . Smear Review 06/08/2016 Criteria for review not met   Final  . Sodium 06/08/2016 138  135 - 146 mmol/L Final  . Potassium 06/08/2016 3.9  3.5 - 5.3 mmol/L Final  . Chloride 06/08/2016 103  98 - 110 mmol/L Final  . CO2 06/08/2016 23  20 - 31 mmol/L Final  . Glucose, Bld 06/08/2016 101* 65 - 99 mg/dL Final  . BUN 06/08/2016 35* 7 - 25 mg/dL Final  . Creat 06/08/2016 1.52* 0.50 - 0.99 mg/dL Final   Comment:   For patients > or = 64 years of age: The upper reference limit for Creatinine is approximately 13% higher for people identified as African-American.     . Total Bilirubin 06/08/2016 1.3* 0.2 - 1.2 mg/dL Final  . Alkaline Phosphatase 06/08/2016 112  33 - 130 U/L Final  . AST 06/08/2016 25  10 - 35 U/L Final  . ALT 06/08/2016 14  6 - 29 U/L Final  . Total Protein 06/08/2016 7.8  6.1 - 8.1 g/dL Final  . Albumin 06/08/2016 4.4  3.6 - 5.1 g/dL Final  . Calcium 06/08/2016 9.9  8.6 - 10.4 mg/dL Final  . GFR, Est African American 06/08/2016 41* >=60 mL/min Final  . GFR, Est Non African American 06/08/2016 36* >=60 mL/min Final  . Cholesterol 06/08/2016 97* 125 - 200 mg/dL Final  . Triglycerides 06/08/2016 59  <150 mg/dL Final  . HDL 06/08/2016 53  >=46 mg/dL Final  . Total CHOL/HDL Ratio 06/08/2016 1.8  <=5.0 Ratio Final  . VLDL 06/08/2016 12  <30 mg/dL Final  . LDL Cholesterol 06/08/2016 32  <130 mg/dL Final   Comment:   Total Cholesterol/HDL Ratio:CHD Risk                        Coronary Heart Disease Risk Table                                        Men       Women          1/2 Average Risk              3.4        3.3              Average Risk               5.0        4.4           2X Average Risk              9.6        7.1           3X Average Risk             23.4       11.0 Use the calculated Patient Ratio above and the CHD   Risk table  to determine the patient's CHD Risk.   . TSH 06/08/2016 0.35* mIU/L Final   Comment:   Reference Range   > or = 20 Years  0.40-4.50   Pregnancy Range First trimester  0.26-2.66 Second trimester 0.55-2.73 Third trimester  0.43-2.91     . Color, Urine 06/09/2016 YELLOW  YELLOW Final  . APPearance 06/09/2016 CLEAR  CLEAR Final  . Specific Gravity, Urine 06/09/2016 1.014  1.001 - 1.035 Final  . pH 06/09/2016 5.0  5.0 - 8.0 Final  . Glucose, UA 06/09/2016 NEGATIVE  NEGATIVE Final  . Bilirubin Urine 06/09/2016 NEGATIVE  NEGATIVE Final  . Ketones, ur 06/09/2016 NEGATIVE  NEGATIVE Final  . Hgb urine dipstick 06/09/2016 NEGATIVE  NEGATIVE Final  . Protein, ur 06/09/2016 NEGATIVE  NEGATIVE Final  . Nitrite 06/09/2016 NEGATIVE  NEGATIVE Final  . Leukocytes, UA 06/09/2016 NEGATIVE  NEGATIVE Final  . Gram Stain 06/10/2016 No WBC Seen   Final  . Gram Stain 06/10/2016 No Squamous Epithelial Cells Seen   Final  . Gram Stain 06/10/2016 Abundant Gram Negative Rods   Final  . Gram Stain 06/10/2016 Moderate Gram Positive Cocci In Pairs   Final  . Organism ID, Bacteria 06/10/2016 Multiple Organisms Present,None Predominant   Final   Comment: No Staphylococcus aureus isolated NO GROUP A STREP (S. PYOGENES) ISOLATED     Nm Hepatobiliary Liver Func  Result Date: 07/19/2016 CLINICAL DATA:  Abdominal pain. EXAM: NUCLEAR MEDICINE HEPATOBILIARY IMAGING TECHNIQUE: Sequential images of the abdomen were obtained out to 60 minutes following intravenous administration of radiopharmaceutical. RADIOPHARMACEUTICALS:  5.0 MCi Tc-99m  Choletec IV COMPARISON:  KUB 07/14/2016. FINDINGS: Prompt uptake and biliary excretion of activity by the liver is seen. Gallbladder activity is visualized, consistent with patency of  cystic duct. Biliary activity passes into small bowel, consistent with patent common bile duct. IMPRESSION: Normal exam. Electronically Signed   By: Thomas  Register   On: 07/19/2016 10:56   Dg Chest Port 1 View  Result Date: 07/15/2016 CLINICAL DATA:  Post aortobifemoral surgery. EXAM: PORTABLE CHEST 1 VIEW COMPARISON:  07/14/2016. FINDINGS: Stable support tubes and lines. Normal heart size. Calcified tortuous aorta. Clear lung fields. No pneumothorax. IMPRESSION: Stable chest. Electronically Signed   By: John T Curnes M.D.   On: 07/15/2016 08:35   Dg Chest Port 1 View  Result Date: 07/14/2016 CLINICAL DATA:  ET tube placement EXAM: PORTABLE CHEST 1 VIEW COMPARISON:  06/21/2008 FINDINGS: Endotracheal tube tip is approximately 2.1 cm superior to the carina. Esophageal tube tip overlies proximal stomach with side port near GE junction. Right IJ Swan-Ganz catheter with tip overlying the expected location of right pulmonary artery. Low lung volumes. Mild interstitial opacities. Streaky atelectasis left base. No effusion. Heart size normal. Mildly tortuous aorta with atherosclerosis. No pneumothorax. IMPRESSION: 1. Support lines and tubes as above 2. Low lung volumes with mild left basilar atelectasis 3. Atherosclerosis of the aorta. 4. Esophageal tube tip overlies proximal stomach, side-port is at the GE junction. Electronically Signed   By: Kim  Fujinaga M.D.   On: 07/14/2016 20:50   Dg Abd Portable 1v  Result Date: 07/14/2016 CLINICAL DATA:  Status post aortobifemoral bypass surgery EXAM: PORTABLE ABDOMEN - 1 VIEW COMPARISON:  06/22/2008 FINDINGS: Esophageal tube tip overlies the proximal stomach. Surgical clips overlie the bilateral pelvis. Surgical clips to the left of L3. Nonspecific gas pattern with overall decreased bowel gas. Calcified pelvic phleboliths. Vascular calcifications. No acute osseous abnormality. IMPRESSION: Nonspecific diffuse decreased bowel gas. Esophageal tube tip   overlies the  stomach, side-port was noted to overlie GE junction on chest radiograph. Electronically Signed   By: Donavan Foil M.D.   On: 07/14/2016 20:52     Assessment/Plan   ICD-9-CM ICD-10-CM   1. Postoperative anemia due to acute blood loss 285.1 D62 CBC with Differential/Platelets  2. PAD (peripheral artery disease) (HCC) 443.9 I73.9 Ambulatory referral to Vascular Surgery   s/p aortobifem bypass and redo left femoral exposure and left fem-pop bypass 07/14/16  3. Left hemiparesis (HCC) 342.90 G81.94   4. Thrush of mouth and esophagus ([place]) 112.84 B37.81    112.0 B37.0   5. Essential hypertension 401.9 I10   6. History of stroke V12.54 Z86.73   7. Hyperlipidemia, unspecified hyperlipidemia type 272.4 E78.5   8. History of tobacco abuse V15.82 Z87.891   9. Elevated bilirubin 277.4 R17 CMP with eGFR  10. Stage 2 chronic kidney disease 585.2 N18.2 CMP with eGFR   Continue current medications as ordered  Follow up with specialists as scheduled. Need appt with vascular surgery to follow up on surgery at hospital  Will call with lab results  She declined flu shot  Follow up in 3 mos for routine visit  Jamespaul Secrist S. Perlie Gold  [facility] [address] [place], [place] [address] [phone] Cell (Monday-Friday 8 AM - 5 PM) [phone] After 5 PM and follow prompts

## 2016-08-11 LAB — CBC WITH DIFFERENTIAL/PLATELET
BASOS ABS: 0 {cells}/uL (ref 0–200)
Basophils Relative: 0 %
EOS PCT: 2 %
Eosinophils Absolute: 186 cells/uL (ref 15–500)
HCT: 35.8 % (ref 35.0–45.0)
HEMOGLOBIN: 11.9 g/dL (ref 11.7–15.5)
LYMPHS ABS: 1953 {cells}/uL (ref 850–3900)
Lymphocytes Relative: 21 %
MCH: 29.5 pg (ref 27.0–33.0)
MCHC: 33.2 g/dL (ref 32.0–36.0)
MCV: 88.6 fL (ref 80.0–100.0)
MONO ABS: 651 {cells}/uL (ref 200–950)
MPV: 10.1 fL (ref 7.5–12.5)
Monocytes Relative: 7 %
NEUTROS ABS: 6510 {cells}/uL (ref 1500–7800)
Neutrophils Relative %: 70 %
Platelets: 273 10*3/uL (ref 140–400)
RBC: 4.04 MIL/uL (ref 3.80–5.10)
RDW: 17 % — ABNORMAL HIGH (ref 11.0–15.0)
WBC: 9.3 10*3/uL (ref 3.8–10.8)

## 2016-08-11 LAB — COMPLETE METABOLIC PANEL WITH GFR
ALBUMIN: 3.9 g/dL (ref 3.6–5.1)
ALK PHOS: 131 U/L — AB (ref 33–130)
ALT: 12 U/L (ref 6–29)
AST: 22 U/L (ref 10–35)
BILIRUBIN TOTAL: 1.2 mg/dL (ref 0.2–1.2)
BUN: 15 mg/dL (ref 7–25)
CO2: 25 mmol/L (ref 20–31)
Calcium: 9.3 mg/dL (ref 8.6–10.4)
Chloride: 97 mmol/L — ABNORMAL LOW (ref 98–110)
Creat: 1.09 mg/dL — ABNORMAL HIGH (ref 0.50–0.99)
GFR, EST NON AFRICAN AMERICAN: 54 mL/min — AB (ref >=60)
GFR, Est African American: 62 mL/min (ref >=60)
GLUCOSE: 92 mg/dL (ref 65–99)
Potassium: 3.5 mmol/L (ref 3.5–5.3)
SODIUM: 137 mmol/L (ref 135–146)
TOTAL PROTEIN: 7.7 g/dL (ref 6.1–8.1)

## 2016-08-15 DIAGNOSIS — E785 Hyperlipidemia, unspecified: Secondary | ICD-10-CM | POA: Diagnosis not present

## 2016-08-15 DIAGNOSIS — M6281 Muscle weakness (generalized): Secondary | ICD-10-CM | POA: Diagnosis not present

## 2016-08-15 DIAGNOSIS — I2510 Atherosclerotic heart disease of native coronary artery without angina pectoris: Secondary | ICD-10-CM | POA: Diagnosis not present

## 2016-08-15 DIAGNOSIS — Z8673 Personal history of transient ischemic attack (TIA), and cerebral infarction without residual deficits: Secondary | ICD-10-CM | POA: Diagnosis not present

## 2016-08-15 DIAGNOSIS — Z48812 Encounter for surgical aftercare following surgery on the circulatory system: Secondary | ICD-10-CM | POA: Diagnosis not present

## 2016-08-15 DIAGNOSIS — F17210 Nicotine dependence, cigarettes, uncomplicated: Secondary | ICD-10-CM | POA: Diagnosis not present

## 2016-08-15 DIAGNOSIS — I739 Peripheral vascular disease, unspecified: Secondary | ICD-10-CM | POA: Diagnosis not present

## 2016-08-15 DIAGNOSIS — Z95828 Presence of other vascular implants and grafts: Secondary | ICD-10-CM | POA: Diagnosis not present

## 2016-08-15 DIAGNOSIS — I10 Essential (primary) hypertension: Secondary | ICD-10-CM | POA: Diagnosis not present

## 2016-08-17 DIAGNOSIS — I2510 Atherosclerotic heart disease of native coronary artery without angina pectoris: Secondary | ICD-10-CM | POA: Diagnosis not present

## 2016-08-17 DIAGNOSIS — I739 Peripheral vascular disease, unspecified: Secondary | ICD-10-CM | POA: Diagnosis not present

## 2016-08-17 DIAGNOSIS — Z8673 Personal history of transient ischemic attack (TIA), and cerebral infarction without residual deficits: Secondary | ICD-10-CM | POA: Diagnosis not present

## 2016-08-17 DIAGNOSIS — F17210 Nicotine dependence, cigarettes, uncomplicated: Secondary | ICD-10-CM | POA: Diagnosis not present

## 2016-08-17 DIAGNOSIS — I10 Essential (primary) hypertension: Secondary | ICD-10-CM | POA: Diagnosis not present

## 2016-08-17 DIAGNOSIS — E785 Hyperlipidemia, unspecified: Secondary | ICD-10-CM | POA: Diagnosis not present

## 2016-08-17 DIAGNOSIS — Z95828 Presence of other vascular implants and grafts: Secondary | ICD-10-CM | POA: Diagnosis not present

## 2016-08-17 DIAGNOSIS — Z48812 Encounter for surgical aftercare following surgery on the circulatory system: Secondary | ICD-10-CM | POA: Diagnosis not present

## 2016-08-17 DIAGNOSIS — M6281 Muscle weakness (generalized): Secondary | ICD-10-CM | POA: Diagnosis not present

## 2016-08-17 NOTE — Addendum Note (Signed)
Addended by: Logan Bores on: 08/17/2016 03:36 PM   Modules accepted: Orders

## 2016-08-18 ENCOUNTER — Ambulatory Visit (INDEPENDENT_AMBULATORY_CARE_PROVIDER_SITE_OTHER): Payer: Commercial Managed Care - HMO | Admitting: Podiatry

## 2016-08-18 ENCOUNTER — Ambulatory Visit (INDEPENDENT_AMBULATORY_CARE_PROVIDER_SITE_OTHER): Payer: Commercial Managed Care - HMO

## 2016-08-18 ENCOUNTER — Other Ambulatory Visit: Payer: Self-pay | Admitting: Podiatry

## 2016-08-18 ENCOUNTER — Encounter: Payer: Self-pay | Admitting: Podiatry

## 2016-08-18 VITALS — BP 134/88 | HR 86 | Resp 16 | Ht 65.0 in | Wt 125.0 lb

## 2016-08-18 DIAGNOSIS — I999 Unspecified disorder of circulatory system: Secondary | ICD-10-CM

## 2016-08-18 DIAGNOSIS — I2510 Atherosclerotic heart disease of native coronary artery without angina pectoris: Secondary | ICD-10-CM | POA: Diagnosis not present

## 2016-08-18 DIAGNOSIS — M79671 Pain in right foot: Secondary | ICD-10-CM | POA: Diagnosis not present

## 2016-08-18 DIAGNOSIS — B351 Tinea unguium: Secondary | ICD-10-CM | POA: Diagnosis not present

## 2016-08-18 DIAGNOSIS — M79605 Pain in left leg: Secondary | ICD-10-CM

## 2016-08-18 DIAGNOSIS — I739 Peripheral vascular disease, unspecified: Secondary | ICD-10-CM | POA: Diagnosis not present

## 2016-08-18 DIAGNOSIS — M79604 Pain in right leg: Secondary | ICD-10-CM | POA: Diagnosis not present

## 2016-08-18 DIAGNOSIS — Z8673 Personal history of transient ischemic attack (TIA), and cerebral infarction without residual deficits: Secondary | ICD-10-CM | POA: Diagnosis not present

## 2016-08-18 DIAGNOSIS — F17210 Nicotine dependence, cigarettes, uncomplicated: Secondary | ICD-10-CM | POA: Diagnosis not present

## 2016-08-18 DIAGNOSIS — E785 Hyperlipidemia, unspecified: Secondary | ICD-10-CM | POA: Diagnosis not present

## 2016-08-18 DIAGNOSIS — M79675 Pain in left toe(s): Secondary | ICD-10-CM

## 2016-08-18 DIAGNOSIS — M6281 Muscle weakness (generalized): Secondary | ICD-10-CM | POA: Diagnosis not present

## 2016-08-18 DIAGNOSIS — Z95828 Presence of other vascular implants and grafts: Secondary | ICD-10-CM | POA: Diagnosis not present

## 2016-08-18 DIAGNOSIS — I10 Essential (primary) hypertension: Secondary | ICD-10-CM | POA: Diagnosis not present

## 2016-08-18 DIAGNOSIS — Z48812 Encounter for surgical aftercare following surgery on the circulatory system: Secondary | ICD-10-CM | POA: Diagnosis not present

## 2016-08-18 NOTE — Progress Notes (Signed)
Subjective:     Patient ID: Wendy Townsend, female   DOB: 24-Sep-1950, 65 y.o.   MRN: [id number]  HPI patient presents with caregiver stating that she had vascular surgery on her left lower leg and they have increase the circulation but she has toes that are quite dark and all her nails are severely thickened incurvated and she cannot cut and they are painful   Review of Systems  All other systems reviewed and are negative.      Objective:   Physical Exam  Constitutional: She is oriented to person, place, and time.  Musculoskeletal: Normal range of motion.  Neurological: She is oriented to person, place, and time.  Skin: Skin is warm and dry.  Nursing note and vitals reviewed.  vascular status found to be diminished bilateral with palpable DP pulses bilateral and PT pulses they're not currently palpable. Patient's left second digit has distal necrosis in the distal one half of the toe and severe nail disease but no current pain or other pathology was noted. Patient has minimal digital perfusion and is well oriented and has caregiver     Assessment:     At risk vascular disease with possibility of amputation which are be necessary left with severe mycotic nail infections 1-5 both feet with pain    Plan:     H&P conditions reviewed and today I debrided nailbeds on both feet which was tolerated well. I gave her strict instructions that if any further changes should occur and the left or if she should start to develop necrosis or other pathology or bone exposure that amputation is a very realistic 8 condition and most likely the second toe is at risk the most all toes and the left foot at risk. She understands this and will return [place] or emergency room if any pathology should occur and is encouraged to inspect this on a daily basis

## 2016-08-18 NOTE — Progress Notes (Signed)
   Subjective:    Patient ID: Wendy Townsend, female    DOB: Aug 31, 1951, 65 y.o.   MRN: [id number]  HPI Chief Complaint  Patient presents with  . Debridement    Bilateral nail trim  . Nail Problem    Left foot; 2nd toe & 5th toe; nail came off 2nd toe; x1 month      Review of Systems  All other systems reviewed and are negative.      Objective:   Physical Exam        Assessment & Plan:

## 2016-08-25 DIAGNOSIS — I739 Peripheral vascular disease, unspecified: Secondary | ICD-10-CM | POA: Diagnosis not present

## 2016-08-25 DIAGNOSIS — I10 Essential (primary) hypertension: Secondary | ICD-10-CM | POA: Diagnosis not present

## 2016-08-25 DIAGNOSIS — I2510 Atherosclerotic heart disease of native coronary artery without angina pectoris: Secondary | ICD-10-CM | POA: Diagnosis not present

## 2016-08-25 DIAGNOSIS — E785 Hyperlipidemia, unspecified: Secondary | ICD-10-CM | POA: Diagnosis not present

## 2016-08-25 DIAGNOSIS — Z48812 Encounter for surgical aftercare following surgery on the circulatory system: Secondary | ICD-10-CM | POA: Diagnosis not present

## 2016-08-25 DIAGNOSIS — F17210 Nicotine dependence, cigarettes, uncomplicated: Secondary | ICD-10-CM | POA: Diagnosis not present

## 2016-08-25 DIAGNOSIS — Z95828 Presence of other vascular implants and grafts: Secondary | ICD-10-CM | POA: Diagnosis not present

## 2016-08-25 DIAGNOSIS — M6281 Muscle weakness (generalized): Secondary | ICD-10-CM | POA: Diagnosis not present

## 2016-08-25 DIAGNOSIS — Z8673 Personal history of transient ischemic attack (TIA), and cerebral infarction without residual deficits: Secondary | ICD-10-CM | POA: Diagnosis not present

## 2016-09-06 DIAGNOSIS — F17210 Nicotine dependence, cigarettes, uncomplicated: Secondary | ICD-10-CM | POA: Diagnosis not present

## 2016-09-06 DIAGNOSIS — M6281 Muscle weakness (generalized): Secondary | ICD-10-CM | POA: Diagnosis not present

## 2016-09-06 DIAGNOSIS — I2510 Atherosclerotic heart disease of native coronary artery without angina pectoris: Secondary | ICD-10-CM | POA: Diagnosis not present

## 2016-09-06 DIAGNOSIS — I10 Essential (primary) hypertension: Secondary | ICD-10-CM | POA: Diagnosis not present

## 2016-09-06 DIAGNOSIS — Z8673 Personal history of transient ischemic attack (TIA), and cerebral infarction without residual deficits: Secondary | ICD-10-CM | POA: Diagnosis not present

## 2016-09-06 DIAGNOSIS — Z95828 Presence of other vascular implants and grafts: Secondary | ICD-10-CM | POA: Diagnosis not present

## 2016-09-06 DIAGNOSIS — I739 Peripheral vascular disease, unspecified: Secondary | ICD-10-CM | POA: Diagnosis not present

## 2016-09-06 DIAGNOSIS — Z48812 Encounter for surgical aftercare following surgery on the circulatory system: Secondary | ICD-10-CM | POA: Diagnosis not present

## 2016-09-06 DIAGNOSIS — E785 Hyperlipidemia, unspecified: Secondary | ICD-10-CM | POA: Diagnosis not present

## 2016-09-19 DIAGNOSIS — E785 Hyperlipidemia, unspecified: Secondary | ICD-10-CM | POA: Diagnosis not present

## 2016-09-19 DIAGNOSIS — Z95828 Presence of other vascular implants and grafts: Secondary | ICD-10-CM | POA: Diagnosis not present

## 2016-09-19 DIAGNOSIS — I739 Peripheral vascular disease, unspecified: Secondary | ICD-10-CM | POA: Diagnosis not present

## 2016-09-19 DIAGNOSIS — I10 Essential (primary) hypertension: Secondary | ICD-10-CM | POA: Diagnosis not present

## 2016-09-19 DIAGNOSIS — Z7982 Long term (current) use of aspirin: Secondary | ICD-10-CM | POA: Diagnosis not present

## 2016-09-19 DIAGNOSIS — R69 Illness, unspecified: Secondary | ICD-10-CM | POA: Diagnosis not present

## 2016-09-19 DIAGNOSIS — Z8673 Personal history of transient ischemic attack (TIA), and cerebral infarction without residual deficits: Secondary | ICD-10-CM | POA: Diagnosis not present

## 2016-09-19 DIAGNOSIS — Z48812 Encounter for surgical aftercare following surgery on the circulatory system: Secondary | ICD-10-CM | POA: Diagnosis not present

## 2016-09-19 DIAGNOSIS — M6281 Muscle weakness (generalized): Secondary | ICD-10-CM | POA: Diagnosis not present

## 2016-09-19 DIAGNOSIS — I2510 Atherosclerotic heart disease of native coronary artery without angina pectoris: Secondary | ICD-10-CM | POA: Diagnosis not present

## 2016-09-27 DIAGNOSIS — Z8673 Personal history of transient ischemic attack (TIA), and cerebral infarction without residual deficits: Secondary | ICD-10-CM | POA: Diagnosis not present

## 2016-09-27 DIAGNOSIS — M6281 Muscle weakness (generalized): Secondary | ICD-10-CM | POA: Diagnosis not present

## 2016-09-27 DIAGNOSIS — F17210 Nicotine dependence, cigarettes, uncomplicated: Secondary | ICD-10-CM | POA: Diagnosis not present

## 2016-09-27 DIAGNOSIS — Z48812 Encounter for surgical aftercare following surgery on the circulatory system: Secondary | ICD-10-CM | POA: Diagnosis not present

## 2016-09-27 DIAGNOSIS — E785 Hyperlipidemia, unspecified: Secondary | ICD-10-CM | POA: Diagnosis not present

## 2016-09-27 DIAGNOSIS — I2510 Atherosclerotic heart disease of native coronary artery without angina pectoris: Secondary | ICD-10-CM | POA: Diagnosis not present

## 2016-09-27 DIAGNOSIS — I739 Peripheral vascular disease, unspecified: Secondary | ICD-10-CM | POA: Diagnosis not present

## 2016-09-27 DIAGNOSIS — Z95828 Presence of other vascular implants and grafts: Secondary | ICD-10-CM | POA: Diagnosis not present

## 2016-09-27 DIAGNOSIS — I10 Essential (primary) hypertension: Secondary | ICD-10-CM | POA: Diagnosis not present

## 2016-09-27 DIAGNOSIS — Z7982 Long term (current) use of aspirin: Secondary | ICD-10-CM | POA: Diagnosis not present

## 2016-09-27 DIAGNOSIS — R69 Illness, unspecified: Secondary | ICD-10-CM | POA: Diagnosis not present

## 2016-09-30 ENCOUNTER — Ambulatory Visit (INDEPENDENT_AMBULATORY_CARE_PROVIDER_SITE_OTHER): Payer: Medicare HMO

## 2016-09-30 VITALS — BP 112/70 | HR 68 | Temp 97.8°F | Ht 65.0 in | Wt 132.0 lb

## 2016-09-30 DIAGNOSIS — Z0000 Encounter for general adult medical examination without abnormal findings: Secondary | ICD-10-CM

## 2016-09-30 DIAGNOSIS — Z23 Encounter for immunization: Secondary | ICD-10-CM | POA: Diagnosis not present

## 2016-09-30 NOTE — Patient Instructions (Addendum)
Wendy Townsend , Thank you for taking time to come for your Medicare Wellness Visit. I appreciate your ongoing commitment to your health goals. Please review the following plan we discussed and let me know if I can assist you in the future.   These are the goals we discussed: Goals    . <enter goal here>          Starting 09/30/16, I will maintain my current lifestyle.        This is a list of the screening recommended for you and due dates:  Health Maintenance  Topic Date Due  .  Hepatitis C: One time screening is recommended by Center for Disease Control  (CDC) for  adults born from [birth year] through 1964.   1951-03-22  . HIV Screening  12/21/1966  . Mammogram  09/19/2006  . Pap Smear  09/19/2012  . Colon Cancer Screening  09/19/2028*  . Shingles Vaccine  09/19/2028*  . Tetanus Vaccine  09/19/2028*  . Flu Shot  Completed  *Topic was postponed. The date shown is not the original due date.  Preventive Care for Adults  A healthy lifestyle and preventive care can promote health and wellness. Preventive health guidelines for adults include the following key practices.  . A routine yearly physical is a good way to check with your health care provider about your health and preventive screening. It is a chance to share any concerns and updates on your health and to receive a thorough exam.  . Visit your dentist for a routine exam and preventive care every 6 months. Brush your teeth twice a day and floss once a day. Good oral hygiene prevents tooth decay and gum disease.  . The frequency of eye exams is based on your age, health, family medical history, use  of contact lenses, and other factors. Follow your health care provider's ecommendations for frequency of eye exams.  . Eat a healthy diet. Foods like vegetables, fruits, whole grains, low-fat dairy products, and lean protein foods contain the nutrients you need without too many calories. Decrease your intake of foods high in solid fats,  added sugars, and salt. Eat the right amount of calories for you. Get information about a proper diet from your health care provider, if necessary.  . Regular physical exercise is one of the most important things you can do for your health. Most adults should get at least 150 minutes of moderate-intensity exercise (any activity that increases your heart rate and causes you to sweat) each week. In addition, most adults need muscle-strengthening exercises on 2 or more days a week.  Silver Sneakers may be a benefit available to you. To determine eligibility, you may visit the website: www.silversneakers.com or contact program at 513-160-9295 Mon-Fri between 8AM-8PM.   . Maintain a healthy weight. The body mass index (BMI) is a screening tool to identify possible weight problems. It provides an estimate of body fat based on height and weight. Your health care provider can find your BMI and can help you achieve or maintain a healthy weight.   For adults 20 years and older: ? A BMI below 18.5 is considered underweight. ? A BMI of 18.5 to 24.9 is normal. ? A BMI of 25 to 29.9 is considered overweight. ? A BMI of 30 and above is considered obese.   . Maintain normal blood lipids and cholesterol levels by exercising and minimizing your intake of saturated fat. Eat a balanced diet with plenty of fruit and vegetables. Blood tests for  lipids and cholesterol should begin at age 70 and be repeated every 5 years. If your lipid or cholesterol levels are high, you are over 50, or you are at high risk for heart disease, you may need your cholesterol levels checked more frequently. Ongoing high lipid and cholesterol levels should be treated with medicines if diet and exercise are not working.  . If you smoke, find out from your health care provider how to quit. If you do not use tobacco, please do not start.  . If you choose to drink alcohol, please do not consume more than 2 drinks per day. One drink is  considered to be 12 ounces (355 mL) of beer, 5 ounces (148 mL) of wine, or 1.5 ounces (44 mL) of liquor.  . If you are 16-45 years old, ask your health care provider if you should take aspirin to prevent strokes.  . Use sunscreen. Apply sunscreen liberally and repeatedly throughout the day. You should seek shade when your shadow is shorter than you. Protect yourself by wearing long sleeves, pants, a wide-brimmed hat, and sunglasses year round, whenever you are outdoors.  . Once a month, do a whole body skin exam, using a mirror to look at the skin on your back. Tell your health care provider of new moles, moles that have irregular borders, moles that are larger than a pencil eraser, or moles that have changed in shape or color.

## 2016-09-30 NOTE — Progress Notes (Signed)
Quick Notes   Health Maintenance:  Pt had flu shot today. Pt due for Hep C, HIV and pap smear. Denies TDAP, Zostavax, Colonoscopy, and MMG at this time.     Abnormal Screen: None; MMSE-29/30 Failed Clock test    Patient Concerns:  None    Nurse Concerns: None

## 2016-09-30 NOTE — Progress Notes (Signed)
Subjective:   Wendy Townsend is a 66 y.o. female who presents for an Initial Medicare Annual Wellness Visit.  Review of Systems     Cardiac Risk Factors include: advanced age (>6men, >70 women);dyslipidemia;hypertension;smoking/ tobacco exposure;sedentary lifestyle     Objective:    Today's Vitals   09/30/16 1107  BP: 112/70  Pulse: 68  Temp: 97.8 F (36.6 C)  TempSrc: Oral  SpO2: 99%  Weight: 132 lb (59.9 kg)  Height: 5\' 5"  (1.651 m)  PainSc: 0-No pain   Body mass index is 21.97 kg/m.   Current Medications (verified) Outpatient Encounter Prescriptions as of 09/30/2016  Medication Sig  . aspirin EC 81 MG tablet TAKE 1 TABLET (325 MG TOTAL) BY MOUTH DAILY.  . carvedilol (COREG) 3.125 MG tablet Take 1 tablet (3.125 mg total) by mouth 2 (two) times daily with a meal.  . cloNIDine (CATAPRES - DOSED IN MG/24 HR) 0.2 mg/24hr patch Place 1 patch (0.2 mg total) onto the skin once a week.  . hydrochlorothiazide (HYDRODIURIL) 25 MG tablet Take 1 tablet (25 mg total) by mouth daily.  . potassium chloride (MICRO-K) 10 MEQ CR capsule Take 1 capsule (10 mEq total) by mouth daily.  . [DISCONTINUED] atorvastatin (LIPITOR) 80 MG tablet Take 1 tablet (80 mg total) by mouth daily at 6 PM.  . [DISCONTINUED] famotidine (PEPCID) 20 MG tablet Take 1 tablet (20 mg total) by mouth 2 (two) times daily.  . [DISCONTINUED] fluconazole (DIFLUCAN) 150 MG tablet Take 1 tablet today and repeat in 3 days  . [DISCONTINUED] nystatin (MYCOSTATIN) 100000 UNIT/ML suspension Take 5 mLs (500,000 Units total) by mouth 4 (four) times daily.  . [DISCONTINUED] saccharomyces boulardii (FLORASTOR) 250 MG capsule Take 1 capsule (250 mg total) by mouth 2 (two) times daily.   No facility-administered encounter medications on file as of 09/30/2016.     Allergies (verified) Penicillins   History: Past Medical History:  Diagnosis Date  . Hyperlipidemia   . Hypertension   . Peripheral vascular disease ([place])    vein surgery, left leg  . Stroke [facility] 2002   ongoing mild loss of feeling in left side, had to learn to write right-handed  . Tobacco abuse    Past Surgical History:  Procedure Laterality Date  . AORTA - BILATERAL FEMORAL ARTERY BYPASS GRAFT N/A 07/14/2016   Procedure: AORTOBIFEMORAL BYPASS GRAFT;  Surgeon: Waynetta Sandy, MD;  Location: [facility];  Service: Vascular;  Laterality: N/A;  . CARDIAC CATHETERIZATION N/A 07/11/2016   Procedure: Left Heart Cath and Coronary Angiography;  Surgeon: Nelva Bush, MD;  Location: [place] [facility];  Service: Cardiovascular;  Laterality: N/A;  . FEMORAL-POPLITEAL BYPASS GRAFT Left 07/14/2016   Procedure: REDO BYPASS GRAFT FEMORAL-POPLITEAL ARTERY;  Surgeon: Waynetta Sandy, MD;  Location: [facility];  Service: Vascular;  Laterality: Left;  . PERIPHERAL VASCULAR CATHETERIZATION N/A 07/08/2016   Procedure: Abdominal Aortogram w/ Bilateral Lower Extremity Runoff;  Surgeon: Elam Dutch, MD;  Location: [place] [facility];  Service: Cardiovascular;  Laterality: N/A;  . VEIN SURGERY Left    Family History  Problem Relation Age of Onset  . Hypertension Mother 72  . Cancer Brother   . Cancer Sister    Social History   Occupational History  . retired    Social History Main Topics  . Smoking status: Former Smoker    Years: 25.00    Types: Cigarettes  . Smokeless tobacco: Never Used     Comment: Has not smoked since being discharged  from hospital   . Alcohol use Yes     Comment: every now and then, last drink was maybe in July  . Drug use:     Types: Marijuana  . Sexual activity: Not Currently    Tobacco Counseling Counseling given: No   Activities of Daily Living In your present state of health, do you have any difficulty performing the following activities: 09/30/2016 07/06/2016  Hearing? N -  Vision? Y -  Difficulty concentrating or making decisions? N -  Walking or climbing stairs? Y -  Dressing or bathing? N -    Doing errands, shopping? Y N  Preparing Food and eating ? N -  Using the Toilet? N -  In the past six months, have you accidently leaked urine? N -  Do you have problems with loss of bowel control? N -  Managing your Medications? N -  Managing your Finances? N -  Housekeeping or managing your Housekeeping? Y -  Some recent data might be hidden    Immunizations and Health Maintenance Immunization History  Administered Date(s) Administered  . Influenza,inj,Quad PF,36+ Mos 09/30/2016   Health Maintenance Due  Topic Date Due  . Hepatitis C Screening  25-Jan-1951  . HIV Screening  12/21/1966  . MAMMOGRAM  09/19/2006  . PAP SMEAR  09/19/2012    Patient Care Team: Gildardo Cranker, DO as PCP - General (Internal Medicine)  Indicate any recent Medical Services you may have received from other than [facility] providers in the past year (date may be approximate).     Assessment:   This is a routine wellness examination for Ha[place].  Hearing/Vision screen  Hearing Screening   125Hz  250Hz  500Hz  1000Hz  2000Hz  3000Hz  4000Hz  6000Hz  8000Hz   Right ear:   100 100 100  100    Left ear:   100 0 100  100    Comments: Pt has not had a hearing screen done. Pt denies any decreased hearing. Passed office hearing screen.    Visual Acuity Screening   Right eye Left eye Both eyes  Without correction: 20/50-1 20/100 20/70  With correction:     Comments: Pt has not had a eye exam in 2 years. Failed eye exam. Pt to schedule an appointment.   Dietary issues and exercise activities discussed: Current Exercise Habits: Home exercise routine, Type of exercise: stretching, Time (Minutes): 15, Frequency (Times/Week): 2, Weekly Exercise (Minutes/Week): 30, Intensity: Mild  Goals    . <enter goal here>          Starting 09/30/16, I will maintain my current lifestyle.       Depression Screen PHQ 2/9 Scores 09/30/2016 06/08/2016  PHQ - 2 Score 0 0    Fall Risk Fall Risk  09/30/2016 08/04/2016 06/08/2016   Falls in the past year? No No No    Cognitive Function: MMSE - Mini Mental State Exam 09/30/2016  Orientation to time 5  Orientation to Place 4  Registration 3  Attention/ Calculation 5  Recall 3  Language- name 2 objects 2  Language- repeat 1  Language- follow 3 step command 3  Language- read & follow direction 1  Write a sentence 1  Copy design 1  Total score 29        Screening Tests Health Maintenance  Topic Date Due  . Hepatitis C Screening  May 26, 1951  . HIV Screening  12/21/1966  . MAMMOGRAM  09/19/2006  . PAP SMEAR  09/19/2012  . COLONOSCOPY  09/19/2028 (Originally 12/20/2001)  . ZOSTAVAX  09/19/2028 (Originally 12/21/2011)  . TETANUS/TDAP  09/19/2028 (Originally 12/21/1970)  . INFLUENZA VACCINE  Completed      Plan:    I have personally reviewed and addressed the Medicare Annual Wellness questionnaire and have noted the following in the patient's chart:  A. Medical and social history B. Use of alcohol, tobacco or illicit drugs  C. Current medications and supplements D. Functional ability and status E.  Nutritional status F.  Physical activity G. Advance directives H. List of other physicians I.  Hospitalizations, surgeries, and ER visits in previous 12 months J.  [place] to include hearing, vision, cognitive, depression L. Referrals and appointments - none  In addition, I have reviewed and discussed with patient certain preventive protocols, quality metrics, and best practice recommendations. A written personalized care plan for preventive services as well as general preventive health recommendations were provided to patient.  See attached scanned questionnaire for additional information.   Signed,   Allyn Kenner, LPN Health Advisor    I have reviewed the health advisor's note and was available for consultation. I agree with documentation and plan.   Abdoulaye Drum S. Perlie Gold  [facility] [address] [place], [place] [address] [phone] Cell (Monday-Friday 8 AM - 5 PM) [phone] After 5 PM and follow prompts

## 2016-10-06 ENCOUNTER — Ambulatory Visit: Payer: Self-pay | Admitting: Cardiovascular Disease

## 2016-10-31 ENCOUNTER — Ambulatory Visit: Payer: Self-pay | Admitting: Cardiovascular Disease

## 2016-11-05 ENCOUNTER — Other Ambulatory Visit: Payer: Self-pay | Admitting: Cardiovascular Disease

## 2016-11-11 ENCOUNTER — Ambulatory Visit: Payer: Commercial Managed Care - HMO | Admitting: Internal Medicine

## 2016-11-11 ENCOUNTER — Encounter: Payer: Self-pay | Admitting: Internal Medicine

## 2016-11-16 ENCOUNTER — Ambulatory Visit: Payer: Medicare HMO | Admitting: Internal Medicine

## 2016-11-25 ENCOUNTER — Ambulatory Visit (INDEPENDENT_AMBULATORY_CARE_PROVIDER_SITE_OTHER): Payer: Medicare HMO | Admitting: Internal Medicine

## 2016-11-25 ENCOUNTER — Encounter: Payer: Self-pay | Admitting: Internal Medicine

## 2016-11-25 VITALS — BP 138/82 | HR 52 | Temp 97.5°F | Ht 65.0 in | Wt 144.2 lb

## 2016-11-25 DIAGNOSIS — I2510 Atherosclerotic heart disease of native coronary artery without angina pectoris: Secondary | ICD-10-CM | POA: Diagnosis not present

## 2016-11-25 DIAGNOSIS — E782 Mixed hyperlipidemia: Secondary | ICD-10-CM

## 2016-11-25 DIAGNOSIS — Z8673 Personal history of transient ischemic attack (TIA), and cerebral infarction without residual deficits: Secondary | ICD-10-CM

## 2016-11-25 DIAGNOSIS — I739 Peripheral vascular disease, unspecified: Secondary | ICD-10-CM

## 2016-11-25 DIAGNOSIS — I10 Essential (primary) hypertension: Secondary | ICD-10-CM | POA: Diagnosis not present

## 2016-11-25 DIAGNOSIS — G8194 Hemiplegia, unspecified affecting left nondominant side: Secondary | ICD-10-CM | POA: Diagnosis not present

## 2016-11-25 DIAGNOSIS — I2583 Coronary atherosclerosis due to lipid rich plaque: Secondary | ICD-10-CM | POA: Diagnosis not present

## 2016-11-25 DIAGNOSIS — N182 Chronic kidney disease, stage 2 (mild): Secondary | ICD-10-CM

## 2016-11-25 DIAGNOSIS — E876 Hypokalemia: Secondary | ICD-10-CM | POA: Diagnosis not present

## 2016-11-25 LAB — BASIC METABOLIC PANEL
BUN: 16 mg/dL (ref 7–25)
CALCIUM: 9.3 mg/dL (ref 8.6–10.4)
CO2: 25 mmol/L (ref 20–31)
Chloride: 106 mmol/L (ref 98–110)
Creat: 1.31 mg/dL — ABNORMAL HIGH (ref 0.50–0.99)
GLUCOSE: 79 mg/dL (ref 65–99)
Potassium: 4.1 mmol/L (ref 3.5–5.3)
Sodium: 139 mmol/L (ref 135–146)

## 2016-11-25 LAB — LIPID PANEL
CHOLESTEROL: 215 mg/dL — AB (ref <200)
HDL: 42 mg/dL — ABNORMAL LOW (ref >50)
LDL Cholesterol: 110 mg/dL — ABNORMAL HIGH (ref <100)
TRIGLYCERIDES: 314 mg/dL — AB (ref <150)
Total CHOL/HDL Ratio: 5.1 Ratio — ABNORMAL HIGH (ref <5.0)
VLDL: 63 mg/dL — ABNORMAL HIGH (ref <30)

## 2016-11-25 MED ORDER — ATORVASTATIN CALCIUM 40 MG PO TABS: 40.0000 mg | ORAL_TABLET | Freq: Every day | ORAL | 6 refills | Status: DC

## 2016-11-25 NOTE — Patient Instructions (Signed)
Start lipitor 40mg  daily  Continue other medications as ordered  Follow up with cardiology Dr Acie Fredrickson as scheduled  Will call with lab results  Follow up in 4 mos for hyperlipidemia and CKD

## 2016-11-25 NOTE — Progress Notes (Signed)
Patient ID: Wendy Townsend, female   DOB: 03/02/1951, 66 y.o.   MRN: [id number]    Location:  [facility] Place of Service: OFFICE  Chief Complaint  Patient presents with  . Medical Management of Chronic Issues    Medical Management of Anemia, PAD, CKD, Hypertension and Cholesterol     HPI:  66 yo female seen today for f/u. She has no concerns today. She is scheduled to see cardiology at the end of March. She states "someone" told her to stop taking lipitor and she has not taken it since her last hospital discharge in Nov 2017.  PAD - followed by cardio Dr Acie Fredrickson. She takes ASA daily. She no longer has pain in her legs with ambulation that resolves with rest. No rest limb pain. She is no longer smoking 3 cigs per day as of Oct 2017.  Hx CVA - stable. Takes ASA daily  HTN - BP stable on coreg, clonidine tabs and HCTZ. Followed by cardio  CAD - stable on ASA daily and BB. LHC shows mild-mod nonobstructive CAD with nml EF. Followed by cardio  Hypokalemia - stable on Micro-K. Last K+ 3.9  Hyperlipidemia - she stopped taking her lipitor after last hospital admission. LDL 70; HDL 40  CKD - stage 2. Cr 1.14  hx tobacco abuse - smoked 2-3 cigs/day and had been smoking x > 30 yrs. She quit while in hospital last month.   Past Medical History:  Diagnosis Date  . Hyperlipidemia   . Hypertension   . Peripheral vascular disease ([place])    vein surgery, left leg  . Stroke [facility]) 2002   ongoing mild loss of feeling in left side, had to learn to write right-handed  . Tobacco abuse     Past Surgical History:  Procedure Laterality Date  . AORTA - BILATERAL FEMORAL ARTERY BYPASS GRAFT N/A 07/14/2016   Procedure: AORTOBIFEMORAL BYPASS GRAFT;  Surgeon: Waynetta Sandy, MD;  Location: [place];  Service: Vascular;  Laterality: N/A;  . CARDIAC CATHETERIZATION N/A 07/11/2016   Procedure: Left Heart Cath and Coronary Angiography;  Surgeon: Nelva Bush, MD;  Location: [place] CV LAB;   Service: Cardiovascular;  Laterality: N/A;  . FEMORAL-POPLITEAL BYPASS GRAFT Left 07/14/2016   Procedure: REDO BYPASS GRAFT FEMORAL-POPLITEAL ARTERY;  Surgeon: Waynetta Sandy, MD;  Location: [place];  Service: Vascular;  Laterality: Left;  . PERIPHERAL VASCULAR CATHETERIZATION N/A 07/08/2016   Procedure: Abdominal Aortogram w/ Bilateral Lower Extremity Runoff;  Surgeon: Elam Dutch, MD;  Location: [place] CV LAB;  Service: Cardiovascular;  Laterality: N/A;  . VEIN SURGERY Left     Patient Care Team: Gildardo Cranker, DO as PCP - General (Internal Medicine)  Social History   Social History  . Marital status: Widowed    Spouse name: N/A  . Number of children: N/A  . Years of education: N/A   Occupational History  . retired    Social History Main Topics  . Smoking status: Former Smoker    Years: 25.00    Types: Cigarettes  . Smokeless tobacco: Never Used     Comment: Has not smoked since being discharged from hospital   . Alcohol use Yes     Comment: every now and then, last drink was maybe in July  . Drug use: Yes    Types: Marijuana  . Sexual activity: Not Currently   Other Topics Concern  . Not on file   Social History Narrative   DIET: None  DO YOU DRINK/EAT THINGS WITH CAFFEINE: no      MARITAL STATUS: Widow      WHAT YEAR WERE YOU MARRIED: 1990      DO YOU LIVE IN A HOUSE, APARTMENT, ASSISTED LIVING, CONDO TRAILER ETC.:  House      IS IT ONE OR MORE STORIES: One      HOW MANY PERSONS LIVE IN YOUR HOME: 3      DO YOU HAVE PETS IN YOUR HOME: no      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      DO YOU EXERCISE: sometimes      WHAT TYPE AND HOW OFTEN: once a week     reports that she has quit smoking. Her smoking use included Cigarettes. She quit after 25.00 years of use. She has never used smokeless tobacco. She reports that she drinks alcohol. She reports that she uses drugs, including Marijuana.  Family History  Problem Relation Age of  Onset  . Hypertension Mother 41  . Cancer Brother   . Cancer Sister    Family Status  Relation Status  . Father Deceased at age 35   Unknown  . Mother Deceased at age 59   [place] Death  . Sister Alive  . Brother Deceased  . Sister Alive  . Sister Deceased  . Sister Deceased  . Brother Alive  . Brother Deceased  . Sister Alive  . Sister Alive     Allergies  Allergen Reactions  . Penicillins Swelling    Has patient had a PCN reaction causing immediate rash, facial/tongue/throat swelling, SOB or lightheadedness with hypotension: YES Has patient had a PCN reaction causing severe rash involving mucus membranes or skin necrosis: NO Has patient had a PCN reaction that required hospitalization NO Has patient had a PCN reaction occurring within the last 10 years: NO If all of the above answers are "NO", then may proceed with Cephalosporin use.    Medications: Patient's Medications  New Prescriptions   No medications on file  Previous Medications   ASPIRIN 81 MG EC TABLET    Take 1 tablet (81 mg total) by mouth daily.   CARVEDILOL (COREG) 3.125 MG TABLET    Take 1 tablet (3.125 mg total) by mouth 2 (two) times daily with a meal.   HYDROCHLOROTHIAZIDE (HYDRODIURIL) 25 MG TABLET    Take 1 tablet (25 mg total) by mouth daily.   POTASSIUM CHLORIDE (MICRO-K) 10 MEQ CR CAPSULE    Take 1 capsule (10 mEq total) by mouth daily.  Modified Medications   No medications on file  Discontinued Medications   CLONIDINE (CATAPRES - DOSED IN MG/24 HR) 0.2 MG/24HR PATCH    Place 1 patch (0.2 mg total) onto the skin once a week.    Review of Systems  Constitutional: Negative for fatigue.  Respiratory: Negative for shortness of breath.   Cardiovascular: Negative for chest pain.  All other systems reviewed and are negative.   Vitals:   11/25/16 0901  BP: 138/82  Pulse: (!) 52  Temp: 97.5 F (36.4 C)  TempSrc: Oral  SpO2: 98%  Weight: 144 lb 3.2 oz (65.4 kg)  Height: _0  (1.651 m)     Body mass index is 24 kg/m.  Physical Exam  Constitutional: She is oriented to person, place, and time. She appears well-developed and well-nourished.  HENT:  Mouth/Throat: Oropharynx is clear and moist. No oropharyngeal exudate.  Eyes: Pupils are equal, round, and reactive to light. No scleral icterus.  Neck: Neck  supple. Carotid bruit is not present. No tracheal deviation present. No thyromegaly present.  Cardiovascular: Normal rate, regular rhythm and intact distal pulses.  Exam reveals no gallop and no friction rub.   Murmur (1/6 SEM) heard. Pulses:      Dorsalis pedis pulses are 2+ on the right side, and 2+ on the left side.       Posterior tibial pulses are 2+ on the right side, and 2+ on the left side.  No RLE edema but has +1 pitting LLE edema without calf TTP.   Pulmonary/Chest: Effort normal and breath sounds normal. No stridor. No respiratory distress. She has no wheezes. She has no rales.  Abdominal: Soft. Bowel sounds are normal. She exhibits no distension and no mass. There is no hepatomegaly. There is no tenderness. There is no rebound and no guarding.  Midline surgical incision healing well with glue still present but no signs of dehiscence, d/c, redness or TTP  Musculoskeletal: She exhibits edema and tenderness.  Lymphadenopathy:    She has no cervical adenopathy.  Neurological: She is alert and oriented to person, place, and time. She exhibits abnormal muscle tone (LLE >LUE). Gait (uses 4 prong cane) abnormal.  Left hemiparesis  Skin: Skin is warm and dry. No rash noted.  Psychiatric: She has a normal mood and affect. Her behavior is normal. Thought content normal. Her speech is slurred.     Labs reviewed: No visits with results within 3 Month(s) from this visit.  Latest known visit with results is:  Office Visit on 08/10/2016  Component Date Value Ref Range Status  . WBC 08/10/2016 9.3  3.8 - 10.8 K/uL Final  . RBC 08/10/2016 4.04  3.80 - 5.10 MIL/uL Final  .  Hemoglobin 08/10/2016 11.9  11.7 - 15.5 g/dL Final  . HCT 08/10/2016 35.8  35.0 - 45.0 % Final  . MCV 08/10/2016 88.6  80.0 - 100.0 fL Final  . MCH 08/10/2016 29.5  27.0 - 33.0 pg Final  . MCHC 08/10/2016 33.2  32.0 - 36.0 g/dL Final  . RDW 08/10/2016 17.0* 11.0 - 15.0 % Final  . Platelets 08/10/2016 273  140 - 400 K/uL Final  . MPV 08/10/2016 10.1  7.5 - 12.5 fL Final  . Neutro Abs 08/10/2016 6510  1,500 - 7,800 cells/uL Final  . Lymphs Abs 08/10/2016 1953  850 - 3,900 cells/uL Final  . Monocytes Absolute 08/10/2016 651  200 - 950 cells/uL Final  . Eosinophils Absolute 08/10/2016 186  15 - 500 cells/uL Final  . Basophils Absolute 08/10/2016 0  0 - 200 cells/uL Final  . Neutrophils Relative % 08/10/2016 70  % Final  . Lymphocytes Relative 08/10/2016 21  % Final  . Monocytes Relative 08/10/2016 7  % Final  . Eosinophils Relative 08/10/2016 2  % Final  . Basophils Relative 08/10/2016 0  % Final  . Smear Review 08/10/2016 Criteria for review not met   Final  . Sodium 08/10/2016 137  135 - 146 mmol/L Final  . Potassium 08/10/2016 3.5  3.5 - 5.3 mmol/L Final  . Chloride 08/10/2016 97* 98 - 110 mmol/L Final  . CO2 08/10/2016 25  20 - 31 mmol/L Final  . Glucose, Bld 08/10/2016 92  65 - 99 mg/dL Final  . BUN 08/10/2016 15  7 - 25 mg/dL Final  . Creat 08/10/2016 1.09* 0.50 - 0.99 mg/dL Final   Comment:   For patients > or = 65 years of age: The upper reference limit for Creatinine is approximately  13% higher for people identified as African-American.     . Total Bilirubin 08/10/2016 1.2  0.2 - 1.2 mg/dL Final  . Alkaline Phosphatase 08/10/2016 131* 33 - 130 U/L Final  . AST 08/10/2016 22  10 - 35 U/L Final  . ALT 08/10/2016 12  6 - 29 U/L Final  . Total Protein 08/10/2016 7.7  6.1 - 8.1 g/dL Final  . Albumin 08/10/2016 3.9  3.6 - 5.1 g/dL Final  . Calcium 08/10/2016 9.3  8.6 - 10.4 mg/dL Final  . GFR, Est African American 08/10/2016 62  >=60 mL/min Final  . GFR, Est Non African  American 08/10/2016 54* >=60 mL/min Final    No results found.   Assessment/Plan   ICD-9-CM ICD-10-CM   1. Mixed hyperlipidemia - noncompliant with medication 272.2 E78.2 Lipid Panel     DISCONTINUED: atorvastatin (LIPITOR) 40 MG tablet  2. PAD (peripheral artery disease) (HCC) 443.9 I73.9   3. History of stroke V12.54 Z86.73   4. Left hemiparesis (HCC) 342.90 G81.94   5. Essential hypertension 401.9 G01 Basic Metabolic Panel  6. Coronary artery disease due to lipid rich plaque 414.00 I25.10    414.3 I25.83   7. Hypokalemia 276.8 V49.4 Basic Metabolic Panel  8. Stage 2 chronic kidney disease 585.2 W96.7 Basic Metabolic Panel   Discussed importance of remaining compliant with ALL medications  Start lipitor 18m daily  Continue other medications as ordered  Follow up with cardiology Dr NAcie Fredricksonas scheduled  Will call with lab results  Follow up in 4 mos for hyperlipidemia and CKD    Amiracle Neises S. CPerlie Gold PFreehold Endoscopy Associates LLCand Adult Medicine [address]GDeer Lake [place] [phone] (Monday-Friday 8 AM - 5 PM) ([phone] 5 PM and follow prompts

## 2016-12-13 ENCOUNTER — Encounter (INDEPENDENT_AMBULATORY_CARE_PROVIDER_SITE_OTHER): Payer: Self-pay

## 2016-12-13 ENCOUNTER — Ambulatory Visit (INDEPENDENT_AMBULATORY_CARE_PROVIDER_SITE_OTHER): Payer: Medicare HMO | Admitting: Cardiovascular Disease

## 2016-12-13 ENCOUNTER — Encounter: Payer: Self-pay | Admitting: Cardiovascular Disease

## 2016-12-13 VITALS — BP 122/74 | HR 60 | Ht 65.0 in | Wt 144.0 lb

## 2016-12-13 DIAGNOSIS — I10 Essential (primary) hypertension: Secondary | ICD-10-CM

## 2016-12-13 MED ORDER — HYDROCHLOROTHIAZIDE 25 MG PO TABS: 25.0000 mg | ORAL_TABLET | Freq: Every day | ORAL | 11 refills | Status: DC

## 2016-12-13 MED ORDER — CLONIDINE HCL 0.2 MG PO TABS: 0.2000 mg | ORAL_TABLET | Freq: Two times a day (BID) | ORAL | 11 refills | Status: DC

## 2016-12-13 MED ORDER — CARVEDILOL 3.125 MG PO TABS: 3.1250 mg | ORAL_TABLET | Freq: Two times a day (BID) | ORAL | 11 refills | Status: DC

## 2016-12-13 NOTE — Patient Instructions (Signed)

## 2016-12-13 NOTE — Progress Notes (Signed)
Wendy Townsend Date of Birth  01-Apr-1951 [place]  [address]    [place]   [place], [place]  [address]    [place], [place]  [address] [phone]  Fax  [phone]  [phone]  Fax [phone]  Problem list: 1. Hypertension 2. Hyperlipidemia 3. Peripheral vascular disease 4. Previous CVA  History of Present Illness:  Wendy Townsend is a 66 year old female with the above-noted medical history. She has history of cigarette smoking.   She would like to try an electronic cigarette to see if she can stop.  She had reduced her smoking.  She has been doing well from a cardiac standpoint.  She's due for fasting labs today.  She just got over a cold but has no CP or dyspnea  February 21, 2013:  She has cut back on her smoking.   February 26, 2014: Wendy Townsend is doing well.  Tolerating her meds quite well.  The nighttime dose of clonidine make her  Sleepy.    Aug. 3, 2016: Wendy Townsend is doing well. No CP or dyspnea.    October 22, 2015  Doing well.   BP is well controlled today .  No CP or dyspena.   December 13, 2016:  She was hospitalized in October, 2017. I saw her in consultation. She was scheduled have vascular surgery and had a history of an abnormal Myoview from 2009. She had an inferior Wall defect.  Subsequent Cath revealed no significant coronary artery disease. She had redo left fem-pop bypass, aorto bifum byass , and left profunda endarectomy  by Dr. Bridgett Larsson and by Dr. Donzetta Matters on oct. 26, 2017   Leg has healed well.  Still a bit swollen .   Does not use salt   Current Outpatient Prescriptions on File Prior to Visit  Medication Sig Dispense Refill  . aspirin 81 MG EC tablet Take 1 tablet (81 mg total) by mouth daily. 30 tablet 1  . carvedilol (COREG) 3.125 MG tablet Take 1 tablet (3.125 mg total) by mouth 2 (two) times daily with a meal. 60 tablet 1  . cloNIDine (CATAPRES) 0.2 MG tablet Take 1 tablet (0.2 mg total) by mouth 2  (two) times daily. 60 tablet 11  . hydrochlorothiazide (HYDRODIURIL) 25 MG tablet Take 1 tablet (25 mg total) by mouth daily. 30 tablet 1  . potassium chloride (MICRO-K) 10 MEQ CR capsule Take 1 capsule (10 mEq total) by mouth daily. 31 capsule 11   No current facility-administered medications on file prior to visit.     Allergies  Allergen Reactions  . Penicillins Swelling    Has patient had a PCN reaction causing immediate rash, facial/tongue/throat swelling, SOB or lightheadedness with hypotension: YES Has patient had a PCN reaction causing severe rash involving mucus membranes or skin necrosis: NO Has patient had a PCN reaction that required hospitalization NO Has patient had a PCN reaction occurring within the last 10 years: NO If all of the above answers are "NO", then may proceed with Cephalosporin use.    Past Medical History:  Diagnosis Date  . Hyperlipidemia   . Hypertension   . Peripheral vascular disease ([place])    vein surgery, left leg  . Stroke [facility]) 2002   ongoing mild loss of feeling in left side, had to learn to write right-handed  . Tobacco abuse     Past Surgical History:  Procedure Laterality Date  . AORTA - BILATERAL FEMORAL ARTERY BYPASS  GRAFT N/A 07/14/2016   Procedure: AORTOBIFEMORAL BYPASS GRAFT;  Surgeon: Waynetta Sandy, MD;  Location: [place];  Service: Vascular;  Laterality: N/A;  . CARDIAC CATHETERIZATION N/A 07/11/2016   Procedure: Left Heart Cath and Coronary Angiography;  Surgeon: Nelva Bush, MD;  Location: [place] CV LAB;  Service: Cardiovascular;  Laterality: N/A;  . FEMORAL-POPLITEAL BYPASS GRAFT Left 07/14/2016   Procedure: REDO BYPASS GRAFT FEMORAL-POPLITEAL ARTERY;  Surgeon: Waynetta Sandy, MD;  Location: [place];  Service: Vascular;  Laterality: Left;  . PERIPHERAL VASCULAR CATHETERIZATION N/A 07/08/2016   Procedure: Abdominal Aortogram w/ Bilateral Lower Extremity Runoff;  Surgeon: Elam Dutch, MD;  Location: [place] [facility];  Service: Cardiovascular;  Laterality: N/A;  . VEIN SURGERY Left     History  Smoking Status  . Former Smoker  . Years: 25.00  . Types: Cigarettes  Smokeless Tobacco  . Never Used    Comment: Has not smoked since being discharged from hospital     History  Alcohol Use  . Yes    Comment: every now and then, last drink was maybe in July    Family History  Problem Relation Age of Onset  . Hypertension Mother 37  . Cancer Brother   . Cancer Sister     Reviw of Systems:  Reviewed in the HPI.  All other systems are negative.  Physical Exam: Blood pressure 122/74, pulse 60, height 5\' 5"  (1.651 m), weight 144 lb (65.3 kg), SpO2 99 %. General: Well developed, well nourished, in no acute distress.  Head: Normocephalic, atraumatic, sclera non-icteric, mucus membranes are moist,     Neck: Supple. Carotids are 2 + without bruits. No JVD  Lungs: Clear bilaterally to auscultation.  Heart: regular rate.  normal  S1 S2. No murmurs, gallops or rubs.  Abdomen: Soft, non-tender, non-distended with normal bowel sounds. No hepatomegaly. No rebound/guarding. No masses.  Msk:  Strength and tone are normal  Extremities: No clubbing or cyanosis. No edema.  Distal pedal pulses are 2+ and equal bilaterally.  Neuro: Alert and oriented X 3. Moves all extremities spontaneously.  Psych:  Responds to questions appropriately with a normal affect.  ECG:    Assessment / Plan:   1. Hypertension - BP is well controlled. Continue current meds.   2. Hyperlipidemia -  Chol levels on March 9 show elevated chol and trig levels  Has been eating more desserts since she stopped smoking  Advised her to watch her diet better.  She will follow up with her primary MD -  Dr Eulas Post   3. Peripheral vascular disease  4. Previous CVA Seems to be stable    Mertie Moores, MD  12/13/2016 11:04 AM    [facility] [place],  [place], [place]   [address] Pager [phone] Phone: [phone]; Fax: [phone]

## 2017-01-05 IMAGING — NM NM HEPATOBILIARY IMAGE, INC GB
2 series · 7 of 7 positions shown · non-contrast
Comparison: KUB 07/14/2016.

CLINICAL DATA: Abdominal pain.

EXAM:
NUCLEAR MEDICINE HEPATOBILIARY IMAGING
TECHNIQUE: Sequential images of the abdomen were obtained [DATE] minutes
following intravenous administration of radiopharmaceutical.
RADIOPHARMACEUTICALS:  5.0 MCi Cc-UUm  Choletec IV

[Series 1: biliary · 4.14mm/px · 6 of 43 frames shown]
[frame 4/43]
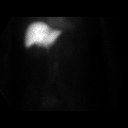
[frame 11/43]
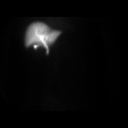
[frame 18/43]
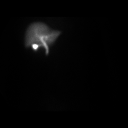
[frame 25/43]
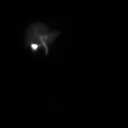
[frame 32/43]
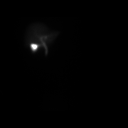
[frame 40/43]
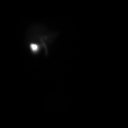

[Series 3: bone statics · 2.07mm/px · 1 of 1 slices shown]
[im 1/1]
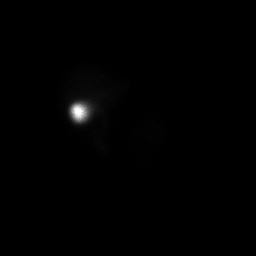

[7 of 7 positions shown; findings below may reference images not displayed]

FINDINGS: Prompt uptake and biliary excretion of activity by the liver is
seen. Gallbladder activity is visualized, consistent with patency of
cystic duct. Biliary activity passes into small bowel, consistent
with patent common bile duct.
IMPRESSION: Normal exam.

## 2017-02-14 ENCOUNTER — Other Ambulatory Visit: Payer: Self-pay | Admitting: Cardiovascular Disease

## 2017-02-28 ENCOUNTER — Other Ambulatory Visit: Payer: Self-pay | Admitting: Cardiovascular Disease

## 2017-03-31 ENCOUNTER — Ambulatory Visit: Payer: Medicare HMO | Admitting: Internal Medicine

## 2017-04-04 ENCOUNTER — Encounter: Payer: Self-pay | Admitting: Internal Medicine

## 2017-04-04 ENCOUNTER — Ambulatory Visit (INDEPENDENT_AMBULATORY_CARE_PROVIDER_SITE_OTHER): Payer: Medicare HMO | Admitting: Internal Medicine

## 2017-04-04 VITALS — BP 118/68 | HR 72 | Temp 97.7°F | Ht 65.0 in | Wt 150.0 lb

## 2017-04-04 DIAGNOSIS — Z8673 Personal history of transient ischemic attack (TIA), and cerebral infarction without residual deficits: Secondary | ICD-10-CM | POA: Diagnosis not present

## 2017-04-04 DIAGNOSIS — E876 Hypokalemia: Secondary | ICD-10-CM | POA: Diagnosis not present

## 2017-04-04 DIAGNOSIS — Z1231 Encounter for screening mammogram for malignant neoplasm of breast: Secondary | ICD-10-CM | POA: Diagnosis not present

## 2017-04-04 DIAGNOSIS — I10 Essential (primary) hypertension: Secondary | ICD-10-CM | POA: Diagnosis not present

## 2017-04-04 DIAGNOSIS — E782 Mixed hyperlipidemia: Secondary | ICD-10-CM

## 2017-04-04 DIAGNOSIS — G8194 Hemiplegia, unspecified affecting left nondominant side: Secondary | ICD-10-CM | POA: Diagnosis not present

## 2017-04-04 DIAGNOSIS — Z79899 Other long term (current) drug therapy: Secondary | ICD-10-CM

## 2017-04-04 DIAGNOSIS — I2583 Coronary atherosclerosis due to lipid rich plaque: Secondary | ICD-10-CM

## 2017-04-04 DIAGNOSIS — R7303 Prediabetes: Secondary | ICD-10-CM

## 2017-04-04 DIAGNOSIS — I739 Peripheral vascular disease, unspecified: Secondary | ICD-10-CM | POA: Diagnosis not present

## 2017-04-04 DIAGNOSIS — I2510 Atherosclerotic heart disease of native coronary artery without angina pectoris: Secondary | ICD-10-CM | POA: Diagnosis not present

## 2017-04-04 DIAGNOSIS — Z23 Encounter for immunization: Secondary | ICD-10-CM | POA: Diagnosis not present

## 2017-04-04 DIAGNOSIS — Z1239 Encounter for other screening for malignant neoplasm of breast: Secondary | ICD-10-CM

## 2017-04-04 LAB — COMPLETE METABOLIC PANEL WITH GFR
ALBUMIN: 4.5 g/dL (ref 3.6–5.1)
ALK PHOS: 123 U/L (ref 33–130)
ALT: 7 U/L (ref 6–29)
AST: 15 U/L (ref 10–35)
BILIRUBIN TOTAL: 0.6 mg/dL (ref 0.2–1.2)
BUN: 28 mg/dL — ABNORMAL HIGH (ref 7–25)
CO2: 25 mmol/L (ref 20–31)
CREATININE: 1.63 mg/dL — AB (ref 0.50–0.99)
Calcium: 9.8 mg/dL (ref 8.6–10.4)
Chloride: 103 mmol/L (ref 98–110)
GFR, Est African American: 38 mL/min — ABNORMAL LOW (ref >=60)
GFR, Est Non African American: 33 mL/min — ABNORMAL LOW (ref >=60)
GLUCOSE: 98 mg/dL (ref 65–99)
Potassium: 4.3 mmol/L (ref 3.5–5.3)
SODIUM: 137 mmol/L (ref 135–146)
TOTAL PROTEIN: 7.8 g/dL (ref 6.1–8.1)

## 2017-04-04 LAB — LIPID PANEL
Cholesterol: 278 mg/dL — ABNORMAL HIGH (ref <200)
HDL: 44 mg/dL — AB (ref >50)
LDL Cholesterol: 197 mg/dL — ABNORMAL HIGH (ref <100)
Total CHOL/HDL Ratio: 6.3 Ratio — ABNORMAL HIGH (ref <5.0)
Triglycerides: 187 mg/dL — ABNORMAL HIGH (ref <150)
VLDL: 37 mg/dL — ABNORMAL HIGH (ref <30)

## 2017-04-04 NOTE — Patient Instructions (Addendum)
Continue current medications as ordered  Reduce calories and watch fatty foods. Increase exercise as tolerated  Will call with lab results  Will call with mammogram appt  Pneumovax given today  Follow up in 3 mos hyperlipidemia, HTN, PAD

## 2017-04-04 NOTE — Progress Notes (Signed)
Patient ID: Wendy Townsend, female   DOB: 1951-04-19, 66 y.o.   MRN: [id number]    Location:  [facility] Place of Service: OFFICE  Chief Complaint  Patient presents with  . Medical Management of Chronic Issues    4 month follow-up on hyperlipidemia and CKD   . Health Maintenance    Refused all recommended immunization and screenings except Mammogram (order placed)    . Medication Refill    No refills needed     HPI:  66 yo female seen today for f/u. She has gained 6 lbs since Mar 2018 but total 18 lbs since Jan 2018. She states her appetite is excellent and she can taste her food now.   PAD - followed by cardio Dr Acie Fredrickson. She takes ASA daily. She no longer has pain in her legs with ambulation that resolves with rest. No rest limb pain. She is no longer smoking 3 cigs per day as of Oct 2017.  Hx CVA - stable. Takes ASA daily  HTN - BP stable on coreg, clonidine tabs and HCTZ. Followed by cardio  CAD - stable on ASA daily and coreg. LHC shows mild-mod nonobstructive CAD with nml EF. Followed by cardio  Hypokalemia - stable on Micro-K. Last K+ 4.1  Hyperlipidemia - she is back on lipitor. LDL 110; HDL 42; Tchol 215  CKD - stage 2. Cr 1.31  hx tobacco abuse - smoked 2-3 cigs/day and had been smoking x > 30 yrs. She quit while in fall 2017.  Prediabetes - A1c 6.2%  Past Medical History:  Diagnosis Date  . Hyperlipidemia   . Hypertension   . Peripheral vascular disease ([place])    vein surgery, left leg  . Stroke [facility]) 2002   ongoing mild loss of feeling in left side, had to learn to write right-handed  . Tobacco abuse     Past Surgical History:  Procedure Laterality Date  . AORTA - BILATERAL FEMORAL ARTERY BYPASS GRAFT N/A 07/14/2016   Procedure: AORTOBIFEMORAL BYPASS GRAFT;  Surgeon: Waynetta Sandy, MD;  Location: [place];  Service: Vascular;  Laterality: N/A;  . CARDIAC CATHETERIZATION N/A 07/11/2016   Procedure: Left Heart Cath and Coronary Angiography;  Surgeon:  Nelva Bush, MD;  Location: [place] [facility];  Service: Cardiovascular;  Laterality: N/A;  . FEMORAL-POPLITEAL BYPASS GRAFT Left 07/14/2016   Procedure: REDO BYPASS GRAFT FEMORAL-POPLITEAL ARTERY;  Surgeon: Waynetta Sandy, MD;  Location: [place];  Service: Vascular;  Laterality: Left;  . PERIPHERAL VASCULAR CATHETERIZATION N/A 07/08/2016   Procedure: Abdominal Aortogram w/ Bilateral Lower Extremity Runoff;  Surgeon: Elam Dutch, MD;  Location: [place] [facility];  Service: Cardiovascular;  Laterality: N/A;  . VEIN SURGERY Left     Patient Care Team: Gildardo Cranker, DO as PCP - General (Internal Medicine)  Social History   Social History  . Marital status: Widowed    Spouse name: N/A  . Number of children: N/A  . Years of education: N/A   Occupational History  . retired    Social History Main Topics  . Smoking status: Former Smoker    Years: 25.00    Types: Cigarettes    Quit date: 05/20/2016  . Smokeless tobacco: Never Used     Comment: Has not smoked since being discharged from hospital   . Alcohol use Yes     Comment: every now and then, last drink was maybe in July  . Drug use: Yes    Types: Marijuana  . Sexual activity:  Not Currently   Other Topics Concern  . Not on file   Social History Narrative   DIET: None      DO YOU DRINK/EAT THINGS WITH CAFFEINE: no      MARITAL STATUS: Widow      WHAT YEAR WERE YOU MARRIED: 1990      DO YOU LIVE IN A HOUSE, APARTMENT, ASSISTED LIVING, CONDO TRAILER ETC.:  House      IS IT ONE OR MORE STORIES: One      HOW MANY PERSONS LIVE IN YOUR HOME: 3      DO YOU HAVE PETS IN YOUR HOME: no      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      DO YOU EXERCISE: sometimes      WHAT TYPE AND HOW OFTEN: once a week     reports that she quit smoking about 10 months ago. Her smoking use included Cigarettes. She quit after 25.00 years of use. She has never used smokeless tobacco. She reports that she drinks alcohol. She  reports that she uses drugs, including Marijuana.  Family History  Problem Relation Age of Onset  . Hypertension Mother 50  . Cancer Brother   . Cancer Sister    Family Status  Relation Status  . Father Deceased at age 45       Unknown  . Mother Deceased at age 58       Nautural Death  . Sister Alive  . Brother Deceased  . Sister Alive  . Sister Deceased  . Sister Deceased  . Brother Alive  . Brother Deceased  . Sister Alive  . Sister Alive     Allergies  Allergen Reactions  . Penicillins Swelling    Has patient had a PCN reaction causing immediate rash, facial/tongue/throat swelling, SOB or lightheadedness with hypotension: YES Has patient had a PCN reaction causing severe rash involving mucus membranes or skin necrosis: NO Has patient had a PCN reaction that required hospitalization NO Has patient had a PCN reaction occurring within the last 10 years: NO If all of the above answers are "NO", then may proceed with Cephalosporin use.    Medications: Patient's Medications  New Prescriptions   No medications on file  Previous Medications   ASPIRIN 81 MG EC TABLET    TAKE 1 TABLET (81 MG TOTAL) BY MOUTH DAILY.   CARVEDILOL (COREG) 3.125 MG TABLET    Take 1 tablet (3.125 mg total) by mouth 2 (two) times daily with a meal.   CLONIDINE (CATAPRES) 0.2 MG TABLET    Take 1 tablet (0.2 mg total) by mouth 2 (two) times daily.   HYDROCHLOROTHIAZIDE (HYDRODIURIL) 25 MG TABLET    Take 1 tablet (25 mg total) by mouth daily.   LISINOPRIL (PRINIVIL,ZESTRIL) 40 MG TABLET    TAKE 1 TABLET (40 MG TOTAL) BY MOUTH DAILY.   POTASSIUM CHLORIDE (MICRO-K) 10 MEQ CR CAPSULE    Take 1 capsule (10 mEq total) by mouth daily.  Modified Medications   No medications on file  Discontinued Medications   No medications on file    Review of Systems  Constitutional: Positive for unexpected weight change.  All other systems reviewed and are negative.   Vitals:   04/04/17 0907  BP: 118/68    Pulse: 72  Temp: 97.7 F (36.5 C)  TempSrc: Oral  Weight: 150 lb (68 kg)  Height: 5' 5"  (1.651 m)   Body mass index is 24.96 kg/m.  Physical Exam  Constitutional: She is  oriented to person, place, and time. She appears well-developed and well-nourished.  HENT:  Mouth/Throat: Oropharynx is clear and moist. No oropharyngeal exudate.  Eyes: Pupils are equal, round, and reactive to light. No scleral icterus.  Neck: Neck supple. Carotid bruit is not present. No tracheal deviation present. No thyromegaly present.  Cardiovascular: Normal rate, regular rhythm and intact distal pulses.  Exam reveals no gallop and no friction rub.   Murmur (1/6 SEM) heard. Pulses:      Dorsalis pedis pulses are 2+ on the right side, and 2+ on the left side.       Posterior tibial pulses are 2+ on the right side, and 2+ on the left side.  No RLE edema but has trace LLE edema without calf TTP.   Pulmonary/Chest: Effort normal and breath sounds normal. No stridor. No respiratory distress. She has no wheezes. She has no rales.  Abdominal: Soft. Bowel sounds are normal. She exhibits no distension and no mass. There is no hepatomegaly. There is no tenderness. There is no rebound and no guarding.  Musculoskeletal: She exhibits edema and tenderness.  Lymphadenopathy:    She has no cervical adenopathy.  Neurological: She is alert and oriented to person, place, and time. She exhibits abnormal muscle tone (LLE >LUE). Gait (uses 4 prong cane) abnormal.  Left hemiparesis  Skin: Skin is warm and dry. No rash noted.  Psychiatric: She has a normal mood and affect. Her behavior is normal. Thought content normal. Her speech is slurred.     Labs reviewed: No visits with results within 3 Month(s) from this visit.  Latest known visit with results is:  Office Visit on 11/25/2016  Component Date Value Ref Range Status  . Sodium 11/25/2016 139  135 - 146 mmol/L Final  . Potassium 11/25/2016 4.1  3.5 - 5.3 mmol/L Final  .  Chloride 11/25/2016 106  98 - 110 mmol/L Final  . CO2 11/25/2016 25  20 - 31 mmol/L Final  . Glucose, Bld 11/25/2016 79  65 - 99 mg/dL Final  . BUN 11/25/2016 16  7 - 25 mg/dL Final  . Creat 11/25/2016 1.31* 0.50 - 0.99 mg/dL Final   Comment:   For patients > or = 65 years of age: The upper reference limit for Creatinine is approximately 13% higher for people identified as African-American.     . Calcium 11/25/2016 9.3  8.6 - 10.4 mg/dL Final  . Cholesterol 11/25/2016 215* <200 mg/dL Final  . Triglycerides 11/25/2016 314* <150 mg/dL Final  . HDL 11/25/2016 42* >50 mg/dL Final  . Total CHOL/HDL Ratio 11/25/2016 5.1* <5.0 Ratio Final  . VLDL 11/25/2016 63* <30 mg/dL Final  . LDL Cholesterol 11/25/2016 110* <100 mg/dL Final    No results found.   Assessment/Plan   ICD-10-CM   1. Mixed hyperlipidemia E78.2 Lipid Panel  2. Hypokalemia E87.6   3. Essential hypertension I10   4. Left hemiparesis ([place]) G81.94   5. History of stroke Z86.73   6. PAD (peripheral artery disease) (H[facility]) I73.9   7. Coronary artery disease due to lipid rich plaque I25.10    I25.83   8. Breast cancer screening Z12.31 MM DIGITAL SCREENING BILATERAL  9. High risk medication use Z79.899 CMP with eGFR  10. Prediabetes R73.03 Hemoglobin A1c  11. Need for pneumococcal vaccine Z23 Pneumococcal polysaccharide vaccine 23-valent greater than or equal to 2yo subcutaneous/IM   Continue current medications as ordered  Reduce calories and watch fatty foods. Increase exercise as tolerated  Will call with  lab results  Will call with mammogram appt  Pneumovax given today  F/u with specialists as scheduled  Follow up in 3 mos hyperlipidemia, HTN, PAD    Wendy Townsend  [facility] and Adult Medicine [address] [place], [place] [address] [phone] Cell (Monday-Friday 8 AM - 5 PM) [phone] After 5 PM and follow prompts

## 2017-04-05 LAB — HEMOGLOBIN A1C
HEMOGLOBIN A1C: 5.7 % — AB (ref <5.7)
MEAN PLASMA GLUCOSE: 117 mg/dL

## 2017-04-05 MED ORDER — ATORVASTATIN CALCIUM 80 MG PO TABS: 80.0000 mg | ORAL_TABLET | Freq: Every day | ORAL | 6 refills | Status: DC

## 2017-04-05 NOTE — Addendum Note (Signed)
Addended by: Denyse Amass on: 04/05/2017 09:02 AM   Modules accepted: Orders

## 2017-05-05 ENCOUNTER — Other Ambulatory Visit: Payer: Medicare HMO

## 2017-05-05 DIAGNOSIS — E782 Mixed hyperlipidemia: Secondary | ICD-10-CM

## 2017-05-05 DIAGNOSIS — Z79899 Other long term (current) drug therapy: Secondary | ICD-10-CM

## 2017-05-06 LAB — LIPID PANEL
Cholesterol: 120 mg/dL (ref <200)
HDL: 39 mg/dL — AB (ref >50)
LDL CALC: 56 mg/dL (ref <100)
TRIGLYCERIDES: 127 mg/dL (ref <150)
Total CHOL/HDL Ratio: 3.1 Ratio (ref <5.0)
VLDL: 25 mg/dL (ref <30)

## 2017-05-06 LAB — ALT: ALT: 9 U/L (ref 6–29)

## 2017-07-14 ENCOUNTER — Ambulatory Visit: Payer: Medicare HMO | Admitting: Internal Medicine

## 2017-07-19 ENCOUNTER — Encounter: Payer: Self-pay | Admitting: Internal Medicine

## 2017-07-19 ENCOUNTER — Ambulatory Visit (INDEPENDENT_AMBULATORY_CARE_PROVIDER_SITE_OTHER): Payer: Medicare HMO | Admitting: Internal Medicine

## 2017-07-19 VITALS — BP 118/70 | HR 60 | Temp 98.6°F | Resp 18 | Ht 65.0 in | Wt 150.4 lb

## 2017-07-19 DIAGNOSIS — Z23 Encounter for immunization: Secondary | ICD-10-CM

## 2017-07-19 DIAGNOSIS — E876 Hypokalemia: Secondary | ICD-10-CM | POA: Diagnosis not present

## 2017-07-19 DIAGNOSIS — I2583 Coronary atherosclerosis due to lipid rich plaque: Secondary | ICD-10-CM | POA: Diagnosis not present

## 2017-07-19 DIAGNOSIS — E782 Mixed hyperlipidemia: Secondary | ICD-10-CM

## 2017-07-19 DIAGNOSIS — Z8673 Personal history of transient ischemic attack (TIA), and cerebral infarction without residual deficits: Secondary | ICD-10-CM

## 2017-07-19 DIAGNOSIS — I739 Peripheral vascular disease, unspecified: Secondary | ICD-10-CM | POA: Diagnosis not present

## 2017-07-19 DIAGNOSIS — I10 Essential (primary) hypertension: Secondary | ICD-10-CM | POA: Diagnosis not present

## 2017-07-19 DIAGNOSIS — R7303 Prediabetes: Secondary | ICD-10-CM

## 2017-07-19 DIAGNOSIS — I2510 Atherosclerotic heart disease of native coronary artery without angina pectoris: Secondary | ICD-10-CM

## 2017-07-19 DIAGNOSIS — G8194 Hemiplegia, unspecified affecting left nondominant side: Secondary | ICD-10-CM

## 2017-07-19 NOTE — Patient Instructions (Signed)
Flu shot given today  Continue current medications as ordered  Follow up with specialists as scheduled  Follow up in 3 mos for hyperlipidemia, prediabetes, CKD and HTN. Fasting labs prior to appt

## 2017-07-19 NOTE — Progress Notes (Signed)
Patient ID: Wendy Townsend, female   DOB: 1951-05-29, 66 y.o.   MRN: [id number]    Location:  [facility] Place of Service: OFFICE  Chief Complaint  Patient presents with  . Medical Management of Chronic Issues    hyperlipidemia    HPI:  66 yo female seen today for f/u. She has no concerns. Weight virtually unchanged since July 2018. Makes healthy lifestyle choices  PAD - followed by cardio Dr Acie Fredrickson. She takes ASA daily. She no longer has pain in her legs with ambulation that resolves with rest. No rest limb pain. She is no longer smoking 3 cigs per day as of Oct 2017.  Hx CVA - stable. Takes ASA daily  HTN - BP stable on coreg, clonidine tabs and HCTZ. Followed by cardio  CAD - stable on ASA daily and coreg. LHC shows mild-mod nonobstructive CAD with nml EF. Followed by cardio  Hypokalemia - stable on Micro-K. Last K+ 4.1  Hyperlipidemia - she is back on lipitor. LDL 56; HDL 39; Tchol 120  CKD - stage 2. Cr 1.63  hx tobacco abuse - smoked 2-3 cigs/day and had been smoking x > 30 yrs. She quit while in fall 2017.  Prediabetes - A1c 5.7%    Past Medical History:  Diagnosis Date  . Hyperlipidemia   . Hypertension   . Peripheral vascular disease ([place])    vein surgery, left leg  . Stroke [facility]) 2002   ongoing mild loss of feeling in left side, had to learn to write right-handed  . Tobacco abuse     Past Surgical History:  Procedure Laterality Date  . AORTA - BILATERAL FEMORAL ARTERY BYPASS GRAFT N/A 07/14/2016   Procedure: AORTOBIFEMORAL BYPASS GRAFT;  Surgeon: Waynetta Sandy, MD;  Location: [place];  Service: Vascular;  Laterality: N/A;  . CARDIAC CATHETERIZATION N/A 07/11/2016   Procedure: Left Heart Cath and Coronary Angiography;  Surgeon: Nelva Bush, MD;  Location: [place] [facility];  Service: Cardiovascular;  Laterality: N/A;  . FEMORAL-POPLITEAL BYPASS GRAFT Left 07/14/2016   Procedure: REDO BYPASS GRAFT FEMORAL-POPLITEAL ARTERY;  Surgeon: Waynetta Sandy, MD;  Location: [place];  Service: Vascular;  Laterality: Left;  . PERIPHERAL VASCULAR CATHETERIZATION N/A 07/08/2016   Procedure: Abdominal Aortogram w/ Bilateral Lower Extremity Runoff;  Surgeon: Elam Dutch, MD;  Location: [place] [facility];  Service: Cardiovascular;  Laterality: N/A;  . VEIN SURGERY Left     Patient Care Team: Gildardo Cranker, DO as PCP - General (Internal Medicine)  Social History   Social History  . Marital status: Widowed    Spouse name: N/A  . Number of children: N/A  . Years of education: N/A   Occupational History  . retired    Social History Main Topics  . Smoking status: Former Smoker    Years: 25.00    Types: Cigarettes    Quit date: 05/20/2016  . Smokeless tobacco: Never Used     Comment: Has not smoked since being discharged from hospital   . Alcohol use Yes     Comment: every now and then, last drink was maybe in July  . Drug use: Yes    Types: Marijuana  . Sexual activity: Not Currently   Other Topics Concern  . 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 Her smoking use included Cigarettes. She quit after 25.00 years of use. She has never used smokeless tobacco. She reports that she drinks alcohol. She reports that she uses drugs, including Marijuana.  Family History  Problem Relation Age of Onset  . Hypertension Mother 12  . Cancer Brother   . Cancer Sister    Family Status  Relation Status  . Father Deceased at  age 94       Unknown  . Mother Deceased at age 19       Nautural Death  . Sister Alive  . Brother Deceased  . Sister Alive  . Sister Deceased  . Sister Deceased  . Brother Alive  . Brother Deceased  . Sister Alive  . Sister Alive     Allergies  Allergen Reactions  . Penicillins Swelling    Has patient had a PCN reaction causing immediate rash, facial/tongue/throat swelling, SOB or lightheadedness with hypotension: YES Has patient had a PCN reaction causing severe rash involving mucus membranes or skin necrosis: NO Has patient had a PCN reaction that required hospitalization NO Has patient had a PCN reaction occurring within the last 10 years: NO If all of the above answers are "NO", then may proceed with Cephalosporin use.    Medications: Patient's Medications  New Prescriptions   No medications on file  Previous Medications   ASPIRIN 81 MG EC TABLET    TAKE 1 TABLET (81 MG TOTAL) BY MOUTH DAILY.   ATORVASTATIN (LIPITOR) 80 MG TABLET    Take 1 tablet (80 mg total) by mouth daily.   CARVEDILOL (COREG) 3.125 MG TABLET    Take 1 tablet (3.125 mg total) by mouth 2 (two) times daily with a meal.   CLONIDINE (CATAPRES) 0.2 MG TABLET    Take 1 tablet (0.2 mg total) by mouth 2 (two) times daily.   HYDROCHLOROTHIAZIDE (HYDRODIURIL) 25 MG TABLET    Take 1 tablet (25 mg total) by mouth daily.   LISINOPRIL (PRINIVIL,ZESTRIL) 40 MG TABLET    TAKE 1 TABLET (40 MG TOTAL) BY MOUTH DAILY.   POTASSIUM CHLORIDE (MICRO-K) 10 MEQ CR CAPSULE    Take 1 capsule (10 mEq total) by mouth daily.  Modified Medications   No medications on file  Discontinued Medications   No medications on file    Review of Systems  Vitals:   07/19/17 1302  BP: 118/70  Pulse: 60  Resp: 18  Temp: 98.6 F (37 C)  TempSrc: Oral  SpO2: 98%  Weight: 150 lb 6.4 oz (68.2 kg)  Height: 5\' 5"  (1.651 m)   Body mass index is 25.03 kg/m.  Physical Exam  Constitutional: She is oriented to person, place, and time.  She appears well-developed and well-nourished.  HENT:  Mouth/Throat: Oropharynx is clear and moist. No oropharyngeal exudate.  MMM; no oral thrush  Eyes: Pupils are equal, round, and reactive to light. No scleral  icterus.  Neck: Neck supple. Carotid bruit is not present. No tracheal deviation present. No thyromegaly present.  Cardiovascular: Normal rate, regular rhythm and intact distal pulses.  Exam reveals no gallop and no friction rub.   Murmur (1/6 SEM) heard. No LE edema b/l. no calf TTP.   Pulmonary/Chest: Effort normal and breath sounds normal. No stridor. No respiratory distress. She has no wheezes. She has no rales.  Abdominal: Soft. Normal appearance and bowel sounds are normal. She exhibits no distension and no mass. There is no hepatomegaly. There is no tenderness. There is no rigidity, no rebound and no guarding. No hernia.  Musculoskeletal: She exhibits edema.  Lymphadenopathy:    She has no cervical adenopathy.  Neurological: She is alert and oriented to person, place, and time. Gait (uses cane) abnormal.  Left hemiparesis  Skin: Skin is warm and dry. No rash noted.  Psychiatric: She has a normal mood and affect. Her behavior is normal. Judgment and thought content normal.     Labs reviewed: Appointment on 05/05/2017  Component Date Value Ref Range Status  . Cholesterol 05/05/2017 120  <200 mg/dL Final  . Triglycerides 05/05/2017 127  <150 mg/dL Final  . HDL 05/05/2017 39* >50 mg/dL Final  . Total CHOL/HDL Ratio 05/05/2017 3.1  <5.0 Ratio Final  . VLDL 05/05/2017 25  <30 mg/dL Final  . LDL Cholesterol 05/05/2017 56  <100 mg/dL Final  . ALT 05/05/2017 9  6 - 29 U/L Final    No results found.   Assessment/Plan   ICD-10-CM   1. Mixed hyperlipidemia E78.2   2. Need for influenza vaccination Z23 Flu Vaccine QUAD 6+ mos PF IM (Fluarix Quad PF)  3. Left hemiparesis ([place]) G81.94   4. History of stroke Z86.73   5. Prediabetes R73.03   6. Hypokalemia E87.6   7.  Essential hypertension I10   8. Coronary artery disease due to lipid rich plaque I25.10    I25.83   9. PAD (peripheral artery disease) (HCC) I73.9    Flu shot given today  Continue current medications as ordered  Follow up with specialists as scheduled  Follow up in 3 mos for hyperlipidemia, prediabetes, CKD and HTN. Fasting labs prior to appt   Pomeroy S. Perlie Gold  [facility] and Adult Medicine [address] [place], [place] [address] [phone] Cell (Monday-Friday 8 AM - 5 PM) [phone] After 5 PM and follow prompts

## 2017-08-17 DIAGNOSIS — Z86718 Personal history of other venous thrombosis and embolism: Secondary | ICD-10-CM | POA: Diagnosis not present

## 2017-08-17 DIAGNOSIS — Z7982 Long term (current) use of aspirin: Secondary | ICD-10-CM | POA: Diagnosis not present

## 2017-08-17 DIAGNOSIS — Z6823 Body mass index (BMI) 23.0-23.9, adult: Secondary | ICD-10-CM | POA: Diagnosis not present

## 2017-08-17 DIAGNOSIS — Z0000 Encounter for general adult medical examination without abnormal findings: Secondary | ICD-10-CM | POA: Diagnosis not present

## 2017-08-17 DIAGNOSIS — I119 Hypertensive heart disease without heart failure: Secondary | ICD-10-CM | POA: Diagnosis not present

## 2017-10-09 ENCOUNTER — Other Ambulatory Visit: Payer: Medicare HMO

## 2017-10-11 ENCOUNTER — Encounter: Payer: Self-pay | Admitting: Internal Medicine

## 2017-10-11 ENCOUNTER — Ambulatory Visit (INDEPENDENT_AMBULATORY_CARE_PROVIDER_SITE_OTHER): Payer: Medicare HMO | Admitting: Internal Medicine

## 2017-10-11 VITALS — BP 112/64 | HR 62 | Temp 97.7°F | Ht 65.0 in | Wt 160.0 lb

## 2017-10-11 DIAGNOSIS — R7303 Prediabetes: Secondary | ICD-10-CM | POA: Diagnosis not present

## 2017-10-11 DIAGNOSIS — Z1231 Encounter for screening mammogram for malignant neoplasm of breast: Secondary | ICD-10-CM

## 2017-10-11 DIAGNOSIS — G8194 Hemiplegia, unspecified affecting left nondominant side: Secondary | ICD-10-CM

## 2017-10-11 DIAGNOSIS — E782 Mixed hyperlipidemia: Secondary | ICD-10-CM | POA: Diagnosis not present

## 2017-10-11 DIAGNOSIS — E876 Hypokalemia: Secondary | ICD-10-CM

## 2017-10-11 DIAGNOSIS — I739 Peripheral vascular disease, unspecified: Secondary | ICD-10-CM

## 2017-10-11 DIAGNOSIS — Z1211 Encounter for screening for malignant neoplasm of colon: Secondary | ICD-10-CM

## 2017-10-11 DIAGNOSIS — I10 Essential (primary) hypertension: Secondary | ICD-10-CM | POA: Diagnosis not present

## 2017-10-11 DIAGNOSIS — Z1239 Encounter for other screening for malignant neoplasm of breast: Secondary | ICD-10-CM

## 2017-10-11 NOTE — Progress Notes (Signed)
Patient ID: Wendy Townsend, female   DOB: 1951/02/15, 67 y.o.   MRN: [id number]   Location:  [facility] OFFICE  Provider: DR Arletha Grippe   Goals of Care:  Advanced Directives 04/04/2017  Does Patient Have a Medical Advance Directive? Yes;No  Type of Advance Directive -  Does patient want to make changes to medical advance directive? No - Patient declined  Copy of [place] in Chart? -  Would patient like information on creating a medical advance directive? -     Chief Complaint  Patient presents with  . Medical Management of Chronic Issues    3 month follow-up, fasting labs   . Medication Refill    No refills needed   . Medication Management    Off potassium x 1 month or longer- ran out, discuss if patient needs to restart. Patient is not sure if she is taking Lipitor.   Marland Kitchen Health Maintenance    Order placed for mammogram and colonoscopy. Refused pap, TDaP, and Shingrix    HPI: Patient is a 67 y.o. female seen today for medical management of chronic diseases.  She would like to discuss whether she needs to continue k+ supplement and lipitor. She has not taken either in several weeks. No other concerns.  PAD - followed by cardio Dr Acie Fredrickson. She takes ASA daily. No claudication sx's. No rest limb pain. She quit smoking in Oct 2017.  Hx CVA - stable. Left hemiparesis. Takes ASA daily  HTN - BP stable on coreg, clonidine tabs and HCTZ. Followed by cardio  CAD - aymptomatic on ASA daily and coreg. LHC shows mild-mod nonobstructive CAD with nml EF. Followed by cardio  Hypokalemia - stable on Micro-K (started in Nov 2017 at d/c from hospital) . Last K+ 4.3.   Hyperlipidemia - no longer takes lipitor and she was unaware that she was supposed to resume med after last OV 3 mos ago.Marland Kitchen LDL 56; HDL 39; Tchol 120  CKD - stage 2. Cr 1.63  hx tobacco abuse - quit smoking in Oct 2017 after > 30 yrs.  Prediabetes - A1c 5.7%   Past Medical History:  Diagnosis Date  .  Hyperlipidemia   . Hypertension   . Peripheral vascular disease ([place])    vein surgery, left leg  . Stroke [facility]) 2002   ongoing mild loss of feeling in left side, had to learn to write right-handed  . Tobacco abuse     Past Surgical History:  Procedure Laterality Date  . AORTA - BILATERAL FEMORAL ARTERY BYPASS GRAFT N/A 07/14/2016   Procedure: AORTOBIFEMORAL BYPASS GRAFT;  Surgeon: Waynetta Sandy, MD;  Location: [place];  Service: Vascular;  Laterality: N/A;  . CARDIAC CATHETERIZATION N/A 07/11/2016   Procedure: Left Heart Cath and Coronary Angiography;  Surgeon: Nelva Bush, MD;  Location: [place] CV LAB;  Service: Cardiovascular;  Laterality: N/A;  . FEMORAL-POPLITEAL BYPASS GRAFT Left 07/14/2016   Procedure: REDO BYPASS GRAFT FEMORAL-POPLITEAL ARTERY;  Surgeon: Waynetta Sandy, MD;  Location: [place];  Service: Vascular;  Laterality: Left;  . PERIPHERAL VASCULAR CATHETERIZATION N/A 07/08/2016   Procedure: Abdominal Aortogram w/ Bilateral Lower Extremity Runoff;  Surgeon: Elam Dutch, MD;  Location: [place] [facility] LAB;  Service: Cardiovascular;  Laterality: N/A;  . VEIN SURGERY Left      reports that she quit smoking about 16 months ago. Her smoking use included cigarettes. She quit after 25.00 years of use. she has never used smokeless tobacco. She reports that  she drinks alcohol. She reports that she uses drugs. Drug: Marijuana. Social History   Socioeconomic History  . Marital status: Widowed    Spouse name: Not on file  . Number of children: Not on file  . Years of education: Not on file  . Highest education level: Not on file  Social Needs  . Financial resource strain: Not on file  . Food insecurity - worry: Not on file  . Food insecurity - inability: Not on file  . Transportation needs - medical: Not on file  . Transportation needs - non-medical: Not on file  Occupational History  . Occupation: retired  Tobacco Use  . Smoking status: Former  Smoker    Years: 25.00    Types: Cigarettes    Last attempt to quit: 05/20/2016    Years since quitting: 1.3  . Smokeless tobacco: Never Used  . Tobacco comment: Has not smoked since being discharged from hospital   Substance and Sexual Activity  . Alcohol use: Yes    Comment: every now and then, last drink was maybe in July  . Drug use: Yes    Types: Marijuana  . Sexual activity: Not Currently  Other Topics Concern  . Not on file  Social History Narrative   DIET: None      DO YOU DRINK/EAT THINGS WITH CAFFEINE: no      MARITAL STATUS: Widow      WHAT YEAR WERE YOU MARRIED: 1990      DO YOU LIVE IN A HOUSE, APARTMENT, ASSISTED LIVING, CONDO TRAILER ETC.:  House      IS IT ONE OR MORE STORIES: One      HOW MANY PERSONS LIVE IN YOUR HOME: 3      DO YOU HAVE PETS IN YOUR HOME: no      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      DO YOU EXERCISE: sometimes      WHAT TYPE AND HOW OFTEN: once a week    Family History  Problem Relation Age of Onset  . Hypertension Mother 30  . Cancer Brother   . Cancer Sister     Allergies  Allergen Reactions  . Penicillins Swelling    Has patient had a PCN reaction causing immediate rash, facial/tongue/throat swelling, SOB or lightheadedness with hypotension: YES Has patient had a PCN reaction causing severe rash involving mucus membranes or skin necrosis: NO Has patient had a PCN reaction that required hospitalization NO Has patient had a PCN reaction occurring within the last 10 years: NO If all of the above answers are "NO", then may proceed with Cephalosporin use.    Outpatient Encounter Medications as of 10/11/2017  Medication Sig  . aspirin 81 MG EC tablet TAKE 1 TABLET (81 MG TOTAL) BY MOUTH DAILY.  . carvedilol (COREG) 3.125 MG tablet Take 1 tablet (3.125 mg total) by mouth 2 (two) times daily with a meal.  . cloNIDine (CATAPRES) 0.2 MG tablet Take 1 tablet (0.2 mg total) by mouth 2 (two) times daily.  . hydrochlorothiazide  (HYDRODIURIL) 25 MG tablet Take 1 tablet (25 mg total) by mouth daily.  Marland Kitchen lisinopril (PRINIVIL,ZESTRIL) 40 MG tablet TAKE 1 TABLET (40 MG TOTAL) BY MOUTH DAILY.  Marland Kitchen atorvastatin (LIPITOR) 80 MG tablet Take 1 tablet (80 mg total) by mouth daily.  . potassium chloride (MICRO-K) 10 MEQ CR capsule Take 1 capsule (10 mEq total) by mouth daily. (Patient not taking: Reported on 10/11/2017)   No facility-administered encounter medications on file as of  10/11/2017.     Review of Systems:  Review of Systems  Respiratory: Positive for shortness of breath.   All other systems reviewed and are negative.   Health Maintenance  Topic Date Due  . MAMMOGRAM  09/19/2006  . PAP SMEAR  09/19/2018 (Originally 09/19/2012)  . DEXA SCAN  09/19/2018 (Originally 12/20/2016)  . Hepatitis C Screening  09/19/2018 (Originally 28-Feb-1951)  . HIV Screening  09/19/2018 (Originally 12/21/1966)  . COLONOSCOPY  09/19/2028 (Originally 12/20/2001)  . TETANUS/TDAP  09/19/2028 (Originally 12/21/1970)  . PNA vac Low Risk Adult (2 of 2 - PCV13) 04/04/2018  . INFLUENZA VACCINE  Completed    Physical Exam: Vitals:   10/11/17 1057  BP: 112/64  Pulse: 62  Temp: 97.7 F (36.5 C)  TempSrc: Oral  SpO2: 98%  Weight: 160 lb (72.6 kg)  Height: 5' 5"  (1.651 m)   Body mass index is 26.63 kg/m. Physical Exam  Constitutional: She is oriented to person, place, and time. She appears well-developed and well-nourished.  HENT:  Mouth/Throat: Oropharynx is clear and moist. No oropharyngeal exudate.  MMM; no oral thrush  Eyes: Pupils are equal, round, and reactive to light. No scleral icterus.  Neck: Neck supple. Carotid bruit is not present. No tracheal deviation present. No thyromegaly present.  Cardiovascular: Normal rate, regular rhythm, normal heart sounds and intact distal pulses. Exam reveals no gallop and no friction rub.  No murmur heard. No LE edema b/l. no calf TTP.   Pulmonary/Chest: Effort normal and breath sounds normal. No  stridor. No respiratory distress. She has no wheezes. She has no rales.  Abdominal: Soft. Normal appearance and bowel sounds are normal. She exhibits no distension and no mass. There is no hepatomegaly. There is no tenderness. There is no rigidity, no rebound and no guarding. No hernia.  Musculoskeletal: She exhibits edema.  Lymphadenopathy:    She has no cervical adenopathy.  Neurological: She is alert and oriented to person, place, and time.  Left hemiparesis  Skin: Skin is warm and dry. No rash noted.  Psychiatric: She has a normal mood and affect. Her behavior is normal. Thought content normal. Her speech is slurred.    Labs reviewed: Basic Metabolic Panel: Recent Labs    11/25/16 0938 04/04/17 1002  NA 139 137  K 4.1 4.3  CL 106 103  CO2 25 25  GLUCOSE 79 98  BUN 16 28*  CREATININE 1.31* 1.63*  CALCIUM 9.3 9.8   Liver Function Tests: Recent Labs    04/04/17 1002 05/05/17 0826  AST 15  --   ALT 7 9  ALKPHOS 123  --   BILITOT 0.6  --   PROT 7.8  --   ALBUMIN 4.5  --    No results for input(s): LIPASE, AMYLASE in the last 8760 hours. No results for input(s): AMMONIA in the last 8760 hours. CBC: No results for input(s): WBC, NEUTROABS, HGB, HCT, MCV, PLT in the last 8760 hours. Lipid Panel: Recent Labs    11/25/16 0938 04/04/17 1002 05/05/17 0826  CHOL 215* 278* 120  HDL 42* 44* 39*  LDLCALC 110* 197* 56  TRIG 314* 187* 127  CHOLHDL 5.1* 6.3* 3.1   Lab Results  Component Value Date   HGBA1C 5.7 (H) 04/04/2017    Procedures since last visit: No results found.  Assessment/Plan   ICD-10-CM   1. Hypokalemia E87.6 CMP with eGFR  2. Colon cancer screening Z12.11 Ambulatory referral to Gastroenterology  3. Breast cancer screening Z12.31 MM DIGITAL SCREENING BILATERAL  4. Mixed hyperlipidemia E78.2 Lipid Panel  5. Left hemiparesis (HCC) G81.94   6. Essential hypertension I10 CMP with eGFR  7. Prediabetes R73.03 Hemoglobin A1c  8. PAD (peripheral artery  disease) (HCC) I73.9 CMP with eGFR    Will hold off on adding potassium until labs resulted  RESUME ATORVASTATIN (LIPITOR) FOR CHOLESTEROL!  Continue other medications as ordered  Will call with lab results  Follow up with specialists as scheduled  Follow up in 3 mos for [place]. Perlie Gold  [facility] [address] [place], [place] [address] [phone] Cell (Monday-Friday 8 AM - 5 PM) [phone] After 5 PM and follow prompts

## 2017-10-11 NOTE — Patient Instructions (Addendum)
Will hold off on adding potassium until labs resulted  RESUME ATORVASTATIN (LIPITOR) FOR CHOLESTEROL!  Continue other medications as ordered  Will call with lab results  Follow up with specialists as scheduled  Follow up in 3 mos for CPE/AWV

## 2017-10-12 LAB — COMPLETE METABOLIC PANEL WITH GFR
AG Ratio: 1.4 (calc) (ref 1.0–2.5)
ALBUMIN MSPROF: 4.4 g/dL (ref 3.6–5.1)
ALT: 11 U/L (ref 6–29)
AST: 16 U/L (ref 10–35)
Alkaline phosphatase (APISO): 142 U/L — ABNORMAL HIGH (ref 33–130)
BUN / CREAT RATIO: 13 (calc) (ref 6–22)
BUN: 17 mg/dL (ref 7–25)
CALCIUM: 9.6 mg/dL (ref 8.6–10.4)
CO2: 29 mmol/L (ref 20–32)
CREATININE: 1.28 mg/dL — AB (ref 0.50–0.99)
Chloride: 104 mmol/L (ref 98–110)
GFR, EST AFRICAN AMERICAN: 51 mL/min/{1.73_m2} — AB (ref >=60)
GFR, EST NON AFRICAN AMERICAN: 44 mL/min/{1.73_m2} — AB (ref >=60)
GLUCOSE: 102 mg/dL — AB (ref 65–99)
Globulin: 3.1 g/dL (calc) (ref 1.9–3.7)
Potassium: 4 mmol/L (ref 3.5–5.3)
Sodium: 140 mmol/L (ref 135–146)
TOTAL PROTEIN: 7.5 g/dL (ref 6.1–8.1)
Total Bilirubin: 0.8 mg/dL (ref 0.2–1.2)

## 2017-10-12 LAB — LIPID PANEL
CHOLESTEROL: 132 mg/dL (ref <200)
HDL: 45 mg/dL — ABNORMAL LOW (ref >50)
LDL Cholesterol (Calc): 67 mg/dL (calc)
Non-HDL Cholesterol (Calc): 87 mg/dL (calc) (ref <130)
Total CHOL/HDL Ratio: 2.9 (calc) (ref <5.0)
Triglycerides: 116 mg/dL (ref <150)

## 2017-10-12 LAB — HEMOGLOBIN A1C
EAG (MMOL/L): 6.8 (calc)
Hgb A1c MFr Bld: 5.9 % of total Hgb — ABNORMAL HIGH (ref <5.7)
MEAN PLASMA GLUCOSE: 123 (calc)

## 2017-10-13 ENCOUNTER — Encounter: Payer: Self-pay | Admitting: Internal Medicine

## 2017-11-01 ENCOUNTER — Ambulatory Visit
Admission: RE | Admit: 2017-11-01 | Discharge: 2017-11-01 | Disposition: A | Discharge status: Home / Self Care | Payer: Medicare HMO | Source: Ambulatory Visit | Attending: Internal Medicine | Admitting: Internal Medicine

## 2017-11-01 DIAGNOSIS — Z1231 Encounter for screening mammogram for malignant neoplasm of breast: Secondary | ICD-10-CM | POA: Diagnosis not present

## 2017-11-01 DIAGNOSIS — Z1239 Encounter for other screening for malignant neoplasm of breast: Secondary | ICD-10-CM

## 2017-11-02 ENCOUNTER — Ambulatory Visit: Payer: Medicare HMO

## 2017-11-15 ENCOUNTER — Other Ambulatory Visit: Payer: Self-pay | Admitting: Internal Medicine

## 2017-11-22 ENCOUNTER — Other Ambulatory Visit: Payer: Self-pay

## 2017-11-22 ENCOUNTER — Ambulatory Visit (AMBULATORY_SURGERY_CENTER): Payer: Self-pay

## 2017-11-22 VITALS — Ht 64.0 in | Wt 162.2 lb

## 2017-11-22 DIAGNOSIS — Z1211 Encounter for screening for malignant neoplasm of colon: Secondary | ICD-10-CM

## 2017-11-22 MED ORDER — PEG-KCL-NACL-NASULF-NA ASC-C 140 G PO SOLR: 1.0000 | ORAL | 0 refills | Status: DC

## 2017-11-22 NOTE — Progress Notes (Signed)
No egg or soy allergy known to patient  No issues with past sedation with any surgeries  or procedures, no intubation problems  No diet pills per patient No home 02 use per patient  No blood thinners per patient  Pt denies issues with constipation  No A fib or A flutter  EMMI video sent to pt's e mail pt declined Samples of this drug were given to the patient, quantity 1 kit lot [id number] exp 06/20 as directed Pt verbalize she didn't know if she could get someone to come with her. Advised she has to have someone to be up with her during the time she is having her procedure. Advised if she can;t find anyone she needs to cancel by the date on her discharge papers. Pt verbalize understanding.

## 2017-11-27 ENCOUNTER — Encounter: Payer: Self-pay | Admitting: Internal Medicine

## 2017-12-06 ENCOUNTER — Encounter: Payer: Medicare HMO | Admitting: Internal Medicine

## 2017-12-13 ENCOUNTER — Other Ambulatory Visit: Payer: Self-pay | Admitting: Cardiovascular Disease

## 2018-01-11 ENCOUNTER — Ambulatory Visit: Payer: Medicare HMO | Admitting: Cardiovascular Disease

## 2018-01-11 ENCOUNTER — Encounter: Payer: Self-pay | Admitting: Cardiovascular Disease

## 2018-01-11 ENCOUNTER — Encounter (INDEPENDENT_AMBULATORY_CARE_PROVIDER_SITE_OTHER): Payer: Self-pay

## 2018-01-11 VITALS — BP 124/78 | HR 58 | Ht 64.0 in | Wt 148.8 lb

## 2018-01-11 DIAGNOSIS — I10 Essential (primary) hypertension: Secondary | ICD-10-CM | POA: Diagnosis not present

## 2018-01-11 DIAGNOSIS — E782 Mixed hyperlipidemia: Secondary | ICD-10-CM

## 2018-01-11 DIAGNOSIS — I739 Peripheral vascular disease, unspecified: Secondary | ICD-10-CM | POA: Diagnosis not present

## 2018-01-11 MED ORDER — ASPIRIN 81 MG PO TBEC: 81.0000 mg | DELAYED_RELEASE_TABLET | Freq: Every day | ORAL | Status: DC

## 2018-01-11 MED ORDER — LISINOPRIL 40 MG PO TABS: 40.0000 mg | ORAL_TABLET | Freq: Every day | ORAL | 3 refills | Status: DC

## 2018-01-11 MED ORDER — CLONIDINE HCL 0.2 MG PO TABS: 0.2000 mg | ORAL_TABLET | Freq: Two times a day (BID) | ORAL | 11 refills | Status: DC

## 2018-01-11 MED ORDER — ATORVASTATIN CALCIUM 80 MG PO TABS: 80.0000 mg | ORAL_TABLET | Freq: Every day | ORAL | 3 refills | Status: DC

## 2018-01-11 NOTE — Progress Notes (Signed)
Wendy Townsend Date of Birth  Feb 04, 1951 [place]  [address]    [place]   [place], [place]  [address]    [place], [place]  [address] [phone]  Fax  [phone]  [phone]  Fax [phone]  Problem list: 1. Hypertension 2. Hyperlipidemia 3. Peripheral vascular disease 4. Previous CVA  History of Present Illness:  Wendy Townsend is a 67 year old female with the above-noted medical history. She has history of cigarette smoking.   She would like to try an electronic cigarette to see if she can stop.  She had reduced her smoking.  She has been doing well from a cardiac standpoint.  She's due for fasting labs today.  She just got over a cold but has no CP or dyspnea  February 21, 2013:  She has cut back on her smoking.   February 26, 2014: Wendy Townsend is doing well.  Tolerating her meds quite well.  The nighttime dose of clonidine make her  Sleepy.    Aug. 3, 2016: Wendy Townsend is doing well. No CP or dyspnea.    October 22, 2015  Doing well.   BP is well controlled today .  No CP or dyspena.   December 13, 2016:  She was hospitalized in October, 2017. I saw her in consultation. She was scheduled have vascular surgery and had a history of an abnormal Myoview from 2009. She had an inferior Wall defect.  Subsequent Cath revealed no significant coronary artery disease. She had redo left fem-pop bypass, aorto bifum byass , and left profunda endarectomy  by Dr. Bridgett Larsson and by Dr. Donzetta Matters on oct. 26, 2017   Leg has healed well.  Still a bit swollen .   Does not use salt   January 11, 2018:    Asked about ASA therapy,   She has a hx of CVA and PAD -I reemphasized the indication for her aspirin. Still smoking on occasion   Current Outpatient Medications on File Prior to Visit  Medication Sig Dispense Refill  . aspirin 81 MG EC tablet TAKE 1 TABLET BY MOUTH EVERY DAY 30 tablet 0  . atorvastatin (LIPITOR) 80 MG tablet TAKE 1 TABLET BY MOUTH  EVERY DAY 30 tablet 6  . carvedilol (COREG) 3.125 MG tablet Take 1 tablet (3.125 mg total) by mouth 2 (two) times daily with a meal. 60 tablet 11  . cloNIDine (CATAPRES) 0.2 MG tablet Take 1 tablet (0.2 mg total) by mouth 2 (two) times daily. 60 tablet 11  . hydrochlorothiazide (HYDRODIURIL) 25 MG tablet Take 1 tablet (25 mg total) by mouth daily. 30 tablet 11  . lisinopril (PRINIVIL,ZESTRIL) 40 MG tablet TAKE 1 TABLET BY MOUTH EVERY DAY 30 tablet 0  . PEG-KCl-NaCl-NaSulf-Na Asc-C (PLENVU) 140 g SOLR Take 1 kit by mouth as directed. 1 each 0   No current facility-administered medications on file prior to visit.     Allergies  Allergen Reactions  . Penicillins Swelling    Has patient had a PCN reaction causing immediate rash, facial/tongue/throat swelling, SOB or lightheadedness with hypotension: YES Has patient had a PCN reaction causing severe rash involving mucus membranes or skin necrosis: NO Has patient had a PCN reaction that required hospitalization NO Has patient had a PCN reaction occurring within the last 10 years: NO If all of the above answers are "NO", then may proceed with Cephalosporin use.    Past Medical History:  Diagnosis Date  . Clotting  disorder ([place])   . Hyperlipidemia   . Hypertension   . Peripheral vascular disease ([place])    vein surgery, left leg  . Stroke [facility]) 2002   ongoing mild loss of feeling in left side, had to learn to write right-handed  . Substance abuse ([place])   . Tobacco abuse     Past Surgical History:  Procedure Laterality Date  . AORTA - BILATERAL FEMORAL ARTERY BYPASS GRAFT N/A 07/14/2016   Procedure: AORTOBIFEMORAL BYPASS GRAFT;  Surgeon: Waynetta Sandy, MD;  Location: [place];  Service: Vascular;  Laterality: N/A;  . CARDIAC CATHETERIZATION N/A 07/11/2016   Procedure: Left Heart Cath and Coronary Angiography;  Surgeon: Nelva Bush, MD;  Location: [place] [facility];  Service: Cardiovascular;  Laterality: N/A;  .  FEMORAL-POPLITEAL BYPASS GRAFT Left 07/14/2016   Procedure: REDO BYPASS GRAFT FEMORAL-POPLITEAL ARTERY;  Surgeon: Waynetta Sandy, MD;  Location: [place];  Service: Vascular;  Laterality: Left;  . PERIPHERAL VASCULAR CATHETERIZATION N/A 07/08/2016   Procedure: Abdominal Aortogram w/ Bilateral Lower Extremity Runoff;  Surgeon: Elam Dutch, MD;  Location: [place] [facility];  Service: Cardiovascular;  Laterality: N/A;  . VEIN SURGERY Left     Social History   Tobacco Use  Smoking Status Former Smoker  . Years: 25.00  . Types: Cigarettes  . Last attempt to quit: 05/20/2016  . Years since quitting: 1.6  Smokeless Tobacco Never Used  Tobacco Comment   Has not smoked since being discharged from hospital     Social History   Substance and Sexual Activity  Alcohol Use Yes   Comment: every now and then, last drink was maybe in July    Family History  Problem Relation Age of Onset  . Hypertension Mother 76  . Cancer Brother   . Cancer Sister   . Colon cancer Neg Hx   . Colon polyps Neg Hx   . Esophageal cancer Neg Hx   . Rectal cancer Neg Hx   . Stomach cancer Neg Hx     Reviw of Systems:   Noted in current history, otherwise review of systems are negative.  Physical Exam: Blood pressure 124/78, pulse (!) 58, height 5' 4"  (1.626 m), weight 148 lb 12.8 oz (67.5 kg), SpO2 99 %.  GEN:  Well nourished, well developed in no acute distress HEENT: Normal NECK: No JVD; No carotid bruits LYMPHATICS: No lymphadenopathy CARDIAC: RRR , soft systolic murmur   RESPIRATORY:  Clear to auscultation without rales, wheezing or rhonchi  ABDOMEN: Soft, non-tender, non-distended MUSCULOSKELETAL:  No edema; No deformity  SKIN: Warm and dry NEUROLOGIC:  S/p CVA    ECG: January 11, 2018: Sinus bradycardia 58.  Nonspecific ST and T wave abnormalities.  No changes from previous tracing.    Assessment / Plan:   1. Hypertension -   blood pressure is well controlled.  Continue current  medications.  2. Hyperlipidemia -    labs were fairly stable in January, 2019.  3. Peripheral vascular disease  4. Previous CVA  cont ASA 81 mg a day     Mertie Moores, MD  01/11/2018 10:13 AM    [place],  [place], [place]  [address] Pager [phone] Phone: [phone]; Fax: [phone]

## 2018-01-11 NOTE — Patient Instructions (Signed)

## 2018-01-12 ENCOUNTER — Other Ambulatory Visit: Payer: Self-pay | Admitting: Cardiovascular Disease

## 2018-01-13 ENCOUNTER — Other Ambulatory Visit: Payer: Self-pay | Admitting: Cardiovascular Disease

## 2018-01-15 ENCOUNTER — Other Ambulatory Visit: Payer: Self-pay | Admitting: Cardiovascular Disease

## 2018-01-19 ENCOUNTER — Ambulatory Visit: Payer: Medicare HMO

## 2018-01-19 ENCOUNTER — Encounter: Payer: Medicare HMO | Admitting: Internal Medicine

## 2018-03-02 ENCOUNTER — Ambulatory Visit (INDEPENDENT_AMBULATORY_CARE_PROVIDER_SITE_OTHER): Payer: Medicare HMO | Admitting: Internal Medicine

## 2018-03-02 ENCOUNTER — Ambulatory Visit (INDEPENDENT_AMBULATORY_CARE_PROVIDER_SITE_OTHER): Payer: Medicare HMO

## 2018-03-02 ENCOUNTER — Encounter: Payer: Self-pay | Admitting: Internal Medicine

## 2018-03-02 VITALS — BP 122/65 | HR 60 | Temp 97.9°F | Ht 64.0 in | Wt 159.0 lb

## 2018-03-02 DIAGNOSIS — Z0000 Encounter for general adult medical examination without abnormal findings: Secondary | ICD-10-CM | POA: Diagnosis not present

## 2018-03-02 DIAGNOSIS — E782 Mixed hyperlipidemia: Secondary | ICD-10-CM

## 2018-03-02 DIAGNOSIS — I10 Essential (primary) hypertension: Secondary | ICD-10-CM

## 2018-03-02 DIAGNOSIS — Z8673 Personal history of transient ischemic attack (TIA), and cerebral infarction without residual deficits: Secondary | ICD-10-CM | POA: Diagnosis not present

## 2018-03-02 DIAGNOSIS — I739 Peripheral vascular disease, unspecified: Secondary | ICD-10-CM | POA: Diagnosis not present

## 2018-03-02 DIAGNOSIS — Z79899 Other long term (current) drug therapy: Secondary | ICD-10-CM | POA: Diagnosis not present

## 2018-03-02 DIAGNOSIS — G8194 Hemiplegia, unspecified affecting left nondominant side: Secondary | ICD-10-CM | POA: Diagnosis not present

## 2018-03-02 DIAGNOSIS — E876 Hypokalemia: Secondary | ICD-10-CM

## 2018-03-02 MED ORDER — ZOSTER VAC RECOMB ADJUVANTED 50 MCG/0.5ML IM SUSR: 0.5000 mL | Freq: Once | INTRAMUSCULAR | 1 refills | Status: AC

## 2018-03-02 MED ORDER — ASPIRIN 81 MG PO TBEC: 81.0000 mg | DELAYED_RELEASE_TABLET | Freq: Every day | ORAL | 11 refills | Status: DC

## 2018-03-02 NOTE — Patient Instructions (Addendum)
Continue current medications as ordered  Will call with lab results  Follow up with specialists as scheduled  RECOMMEND [place]  Follow up in 3 mos for HTN, hx CVA, hyperlipidemia, PAD, hypokalemia  Keeping You Healthy  Get These Tests  Blood Pressure- Have your blood pressure checked by your healthcare provider at least once a year.  Normal blood pressure is 120/80.  Weight- Have your body mass index (BMI) calculated to screen for obesity.  BMI is a measure of body fat based on height and weight.  You can calculate your own BMI at [employer].it  Cholesterol- Have your cholesterol checked every year.  Diabetes- Have your blood sugar checked every year if you have high blood pressure, high cholesterol, a family history of diabetes or if you are overweight.  Pap Test - Have a pap test every 1 to 5 years if you have been sexually active.  If you are older than 65 and recent pap tests have been normal you may not need additional pap tests.  In addition, if you have had a hysterectomy  for benign disease additional pap tests are not necessary.  Mammogram-Yearly mammograms are essential for early detection of breast cancer  Screening for Colon Cancer- Colonoscopy starting at age 49. Screening may begin sooner depending on your family history and other health conditions.  Follow up colonoscopy as directed by your Gastroenterologist.  Screening for Osteoporosis- Screening begins at age 28 with bone density scanning, sooner if you are at higher risk for developing Osteoporosis.  Get these medicines  Calcium with Vitamin D- Your body requires 1200-1500 mg of Calcium a day and [id number] IU of Vitamin D a day.  You can only absorb 500 mg of Calcium at a time therefore Calcium must be taken in 2 or 3 separate doses throughout the day.  Hormones- Hormone therapy has been associated with increased risk for certain cancers and heart disease.   Talk to your healthcare provider about if you need relief from menopausal symptoms.  Aspirin- Ask your healthcare provider about taking Aspirin to prevent Heart Disease and Stroke.  Get these Immuniztions  Flu shot- Every fall  Pneumonia shot- Once after the age of 38; if you are younger ask your healthcare provider if you need a pneumonia shot.  Tetanus- Every ten years.  Zostavax- Once after the age of 42 to prevent shingles.  Take these steps  Don't smoke- Your healthcare provider can help you quit. For tips on how to quit, ask your healthcare provider or go to www.smokefree.gov or call 1-800 QUIT-NOW.  Be physically active- Exercise 5 days a week for a minimum of 30 minutes.  If you are not already physically active, start slow and gradually work up to 30 minutes of moderate physical activity.  Try walking, dancing, bike riding, swimming, etc.  Eat a healthy diet- Eat a variety of healthy foods such as fruits, vegetables, whole grains, low fat milk, low fat cheeses, yogurt, lean meats, chicken, fish, eggs, dried beans, tofu, etc.  For more information go to www.thenutritionsource.org  Dental visit- Brush and floss teeth twice daily; visit your dentist twice a year.  Eye exam- Visit your Optometrist or Ophthalmologist yearly.  Drink alcohol in moderation- Limit alcohol intake to one drink or less a day.  Never drink and drive.  Depression- Your emotional health is as important as your physical health.  If you're feeling down or losing interest in things you normally enjoy, please talk to  your healthcare provider.  Seat Belts- can save your life; always wear one  Smoke/Carbon Monoxide detectors- These detectors need to be installed on the appropriate level of your home.  Replace batteries at least once a year.  Violence- If anyone is threatening or hurting you, please tell your healthcare provider.  Living Will/ Health care power of attorney- Discuss with your healthcare  provider and family.

## 2018-03-02 NOTE — Progress Notes (Signed)
Subjective:   Wendy Townsend is a 67 y.o. female who presents for an Initial Medicare Annual Wellness Visit.        Objective:    Today's Vitals   03/02/18 0859  BP: 122/65  Pulse: 60  Temp: 97.9 F (36.6 C)  TempSrc: Oral  SpO2: 94%  Weight: 159 lb (72.1 kg)  Height: 5' 4"  (1.626 m)   Body mass index is 27.29 kg/m.  Advanced Directives 03/02/2018 04/04/2017 09/30/2016 08/10/2016 08/04/2016 07/20/2016 07/06/2016  Does Patient Have a Medical Advance Directive? No Yes;No - Yes Yes No No  Type of Advance Directive - Scientist, water quality of [place];Living will [place];Living will - -  Does patient want to make changes to medical advance directive? - No - Patient declined - - - - -  Copy of [place] in Chart? - - - No - copy requested No - copy requested - -  Would patient like information on creating a medical advance directive? Yes (MAU/Ambulatory/Procedural Areas - Information given) - No - Patient declined - - No - patient declined information -    Current Medications (verified) Outpatient Encounter Medications as of 03/02/2018  Medication Sig  . aspirin 81 MG EC tablet TAKE 1 TABLET BY MOUTH EVERY DAY  . atorvastatin (LIPITOR) 80 MG tablet Take 1 tablet (80 mg total) by mouth daily.  . carvedilol (COREG) 3.125 MG tablet TAKE 1 TABLET (3.125 MG TOTAL) BY MOUTH 2 (TWO) TIMES DAILY WITH A MEAL.  . cloNIDine (CATAPRES) 0.2 MG tablet Take 1 tablet (0.2 mg total) by mouth 2 (two) times daily.  . hydrochlorothiazide (HYDRODIURIL) 25 MG tablet TAKE 1 TABLET BY MOUTH EVERY DAY  . lisinopril (PRINIVIL,ZESTRIL) 40 MG tablet Take 1 tablet (40 mg total) by mouth daily.  Marland Kitchen Zoster Vaccine Adjuvanted [facility]) injection Inject 0.5 mLs into the muscle once for 1 dose.  . [DISCONTINUED] Zoster Vaccine Adjuvanted [facility]) injection Inject 0.5 mLs into the muscle once.  Marland Kitchen PEG-KCl-NaCl-NaSulf-Na Asc-C (PLENVU) 140 g SOLR  Take 1 kit by mouth as directed.   No facility-administered encounter medications on file as of 03/02/2018.     Allergies (verified) Penicillins   History: Past Medical History:  Diagnosis Date  . Clotting disorder ([place])   . Hyperlipidemia   . Hypertension   . Peripheral vascular disease ([place])    vein surgery, left leg  . Stroke [facility]) 2002   ongoing mild loss of feeling in left side, had to learn to write right-handed  . Substance abuse ([place])   . Tobacco abuse    Past Surgical History:  Procedure Laterality Date  . AORTA - BILATERAL FEMORAL ARTERY BYPASS GRAFT N/A 07/14/2016   Procedure: AORTOBIFEMORAL BYPASS GRAFT;  Surgeon: Waynetta Sandy, MD;  Location: [place];  Service: Vascular;  Laterality: N/A;  . CARDIAC CATHETERIZATION N/A 07/11/2016   Procedure: Left Heart Cath and Coronary Angiography;  Surgeon: Nelva Bush, MD;  Location: [place] [facility];  Service: Cardiovascular;  Laterality: N/A;  . FEMORAL-POPLITEAL BYPASS GRAFT Left 07/14/2016   Procedure: REDO BYPASS GRAFT FEMORAL-POPLITEAL ARTERY;  Surgeon: Waynetta Sandy, MD;  Location: [place];  Service: Vascular;  Laterality: Left;  . PERIPHERAL VASCULAR CATHETERIZATION N/A 07/08/2016   Procedure: Abdominal Aortogram w/ Bilateral Lower Extremity Runoff;  Surgeon: Elam Dutch, MD;  Location: [place] [facility];  Service: Cardiovascular;  Laterality: N/A;  . VEIN SURGERY Left    Family History  Problem Relation Age  of Onset  . Hypertension Mother 40  . Cancer Brother   . Cancer Sister   . Colon cancer Neg Hx   . Colon polyps Neg Hx   . Esophageal cancer Neg Hx   . Rectal cancer Neg Hx   . Stomach cancer Neg Hx    Social History   Socioeconomic History  . Marital status: Widowed    Spouse name: Not on file  . Number of children: Not on file  . Years of education: Not on file  . Highest education level: Not on file  Occupational History  . Occupation: retired  Scientific laboratory technician  .  Financial resource strain: Not hard at all  . Food insecurity:    Worry: Never true    Inability: Never true  . Transportation needs:    Medical: No    Non-medical: No  Tobacco Use  . Smoking status: Former Smoker    Years: 25.00    Types: Cigarettes    Last attempt to quit: 05/20/2016    Years since quitting: 1.7  . Smokeless tobacco: Never Used  . Tobacco comment: Has not smoked since being discharged from hospital   Substance and Sexual Activity  . Alcohol use: Yes    Comment: 2-3 times a year  . Drug use: Yes    Types: Marijuana    Comment: when she can't sleep sometimes at night  . Sexual activity: Not Currently  Lifestyle  . Physical activity:    Days per week: 0 days    Minutes per session: 0 min  . Stress: Only a little  Relationships  . Social connections:    Talks on phone: Three times a week    Gets together: Three times a week    Attends religious service: 1 to 4 times per year    Active member of club or organization: No    Attends meetings of clubs or organizations: Never    Relationship status: Widowed  Other Topics Concern  . Not on file  Social History Narrative   DIET: None      DO YOU DRINK/EAT THINGS WITH CAFFEINE: no      MARITAL STATUS: Widow      WHAT YEAR WERE YOU MARRIED: 1990      DO YOU LIVE IN A HOUSE, APARTMENT, ASSISTED LIVING, CONDO TRAILER ETC.:  House      IS IT ONE OR MORE STORIES: One      HOW MANY PERSONS LIVE IN YOUR HOME: 3      DO YOU HAVE PETS IN YOUR HOME: no      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      DO YOU EXERCISE: sometimes      WHAT TYPE AND HOW OFTEN: once a week    Tobacco Counseling Counseling given: Not Answered Comment: Has not smoked since being discharged from hospital    Clinical Intake:  Pre-visit preparation completed: No  Pain : No/denies pain     Nutritional Risks: None Diabetes: No  How often do you need to have someone help you when you read instructions, pamphlets, or other  written materials from your doctor or pharmacy?: 1 - Never What is the last grade level you completed in school?: [place]?: No  Information entered by :: Tyson Dense, RN   Activities of Daily Living In your present state of health, do you have any difficulty performing the following activities: 03/02/2018  Hearing? N  Vision? Y  Comment stated she was going  to get a new prescription soon  Difficulty concentrating or making decisions? Y  Walking or climbing stairs? Y  Dressing or bathing? N  Doing errands, shopping? N  Preparing Food and eating ? N  Using the Toilet? N  In the past six months, have you accidently leaked urine? N  Do you have problems with loss of bowel control? N  Managing your Medications? N  Managing your Finances? N  Housekeeping or managing your Housekeeping? N  Some recent data might be hidden     Immunizations and Health Maintenance Immunization History  Administered Date(s) Administered  . Influenza,inj,Quad PF,6+ Mos 09/30/2016, 07/19/2017  . Pneumococcal Polysaccharide-23 04/04/2017   There are no preventive care reminders to display for this patient.  Patient Care Team: Gildardo Cranker, DO as PCP - General (Internal Medicine) Nahser, Wonda Cheng, MD as PCP - Cardiology (Cardiology)  Indicate any recent Medical Services you may have received from other than [facility] providers in the past year (date may be approximate).     Assessment:   This is a routine wellness examination for Saraiya.  Hearing/Vision screen Hearing Screening Comments: Patient declines hearing screen Vision Screening Comments: Does not see eye doctor regurally  Dietary issues and exercise activities discussed: Current Exercise Habits: The patient does not participate in regular exercise at present, Exercise limited by: orthopedic condition(s)  Goals    None     Depression Screen PHQ 2/9 Scores 03/02/2018 11/25/2016 09/30/2016 06/08/2016  PHQ - 2 Score 0 0 0  0  PHQ- 9 Score - 0 - -    Fall Risk Fall Risk  03/02/2018 10/11/2017 04/04/2017 09/30/2016 08/04/2016  Falls in the past year? No No No No No    Is the patient's home free of loose throw rugs in walkways, pet beds, electrical cords, etc?   yes      Grab bars in the bathroom? yes      Handrails on the stairs?   yes      Adequate lighting?   yes   Cognitive Function: MMSE - Mini Mental State Exam 03/02/2018 09/30/2016  Orientation to time 4 5  Orientation to Place 4 4  Registration 3 3  Attention/ Calculation 5 5  Recall 2 3  Language- name 2 objects 2 2  Language- repeat 1 1  Language- follow 3 step command 3 3  Language- read & follow direction 1 1  Write a sentence 1 1  Copy design 1 1  Total score 27 29        Screening Tests Health Maintenance  Topic Date Due  . DEXA SCAN  09/19/2018 (Originally 12/20/2016)  . Hepatitis C Screening  09/19/2018 (Originally Jul 22, 1951)  . COLONOSCOPY  09/19/2028 (Originally 12/20/2001)  . TETANUS/TDAP  09/19/2028 (Originally 12/21/1970)  . PNA vac Low Risk Adult (2 of 2 - PCV13) 04/04/2018  . INFLUENZA VACCINE  04/19/2018  . MAMMOGRAM  11/02/2019    Qualifies for Shingles Vaccine? Yes, educated and declined  Cancer Screenings: Lung: Low Dose CT Chest recommended if Age 26-80 years, 30 pack-year currently smoking OR have quit w/in 15years. Patient does qualify. Breast: Up to date on Mammogram? Yes   Up to date of Bone Density/Dexa? No, declined Colorectal: due. cologuard information signed  Additional Screenings:  Hepatitis C Screening: declined TDAP due, ordered    Plan:    I have personally reviewed and addressed the Medicare Annual Wellness questionnaire and have noted the following in the patient's chart:  A. Medical and social history  B. Use of alcohol, tobacco or illicit drugs  C. Current medications and supplements D. Functional ability and status E.  Nutritional status F.  Physical activity G. Advance directives H. List of  other physicians I.  Hospitalizations, surgeries, and ER visits in previous 12 months J.  [place] to include hearing, vision, cognitive, depression L. Referrals and appointments - none  In addition, I have reviewed and discussed with patient certain preventive protocols, quality metrics, and best practice recommendations. A written personalized care plan for preventive services as well as general preventive health recommendations were provided to patient.  See attached scanned questionnaire for additional information.   Signed,   Tyson Dense, RN Nurse Health Advisor  Patient Concerns: None

## 2018-03-02 NOTE — Progress Notes (Signed)
Patient ID: Wendy Townsend, female   DOB: 21-Oct-1950, 67 y.o.   MRN: [id number]   Location:  [facility]  Place of Service:  OFFICE  Provider: Arletha Grippe, DO  Patient Care Team: Gildardo Cranker, DO as PCP - General (Internal Medicine) Nahser, Wonda Cheng, MD as PCP - Cardiology (Cardiology)  Extended Emergency Contact Information Primary Emergency Contact: Claudina Lick, [place] [address] [place] Phone: [phone] Relation: Niece  Code Status:  Goals of Care: Advanced Directive information Advanced Directives 03/02/2018  Does Patient Have a Medical Advance Directive? No  Type of Advance Directive -  Does patient want to make changes to medical advance directive? -  Copy of [place] in Chart? -  Would patient like information on creating a medical advance directive? Yes (MAU/Ambulatory/Procedural Areas - Information given)     Chief Complaint  Patient presents with  . Annual Exam    Annual Physical. AWV completed today. MMSE 27/30. Passed clock test. No concerns today.    HPI: Patient is a 67 y.o. female seen in today for a comprehensive exam.  She completed AWV today. MMSE 27/30. She is off disability and now on "retirement". She has no concerns.  PAD - s/p aortobifem bypass in Oct 2017. She is followed by cardio Dr Acie Fredrickson. She takes ASA daily. No claudication sx's. No rest limb pain. She quit smoking in Oct 2017.  Hx CVA - stable. Has residual left hemiparesis. Takes ASA daily  HTN - BP stable on coreg, clonidine tabs and HCTZ. Followed by cardio Dr Manley Mason  CAD - aymptomatic on ASA daily and coreg. LHC showed mild-mod nonobstructive CAD with nml EF. Followed by cardio  Hypokalemia - stable on Micro-K (started in Nov 2017 at d/c from hospital) . Last K+ 4.3.   Hyperlipidemia - no longer takes lipitor and she was unaware that she was supposed to resume med after last OV 3 mos ago.Marland Kitchen LDL 56; HDL 39; Tchol 120  CKD - stage  2. Cr 1.63  hx tobacco abuse - quit smoking in Oct 2017 after > 30 yrs.  Prediabetes - A1c 5.7%    Depression screen [facility] 2/9 03/02/2018 11/25/2016 09/30/2016 06/08/2016  Decreased Interest 0 0 0 0  Down, Depressed, Hopeless 0 0 0 0  PHQ - 2 Score 0 0 0 0  Altered sleeping - 0 - -  Tired, decreased energy - 0 - -  Change in appetite - 0 - -  Feeling bad or failure about yourself  - 0 - -  Trouble concentrating - 0 - -  Moving slowly or fidgety/restless - 0 - -  Suicidal thoughts - 0 - -  PHQ-9 Score - 0 - -    Fall Risk  03/02/2018 10/11/2017 04/04/2017 09/30/2016 08/04/2016  Falls in the past year? No No No No No   MMSE - Mini Mental State Exam 03/02/2018 09/30/2016  Orientation to time 4 5  Orientation to Place 4 4  Registration 3 3  Attention/ Calculation 5 5  Recall 2 3  Language- name 2 objects 2 2  Language- repeat 1 1  Language- follow 3 step command 3 3  Language- read & follow direction 1 1  Write a sentence 1 1  Copy design 1 1  Total score 27 29     Health Maintenance  Topic Date Due  . DEXA SCAN  09/19/2018 (Originally 12/20/2016)  . Hepatitis C Screening  09/19/2018 (  Originally 1951/04/09)  . COLONOSCOPY  09/19/2028 (Originally 12/20/2001)  . TETANUS/TDAP  09/19/2028 (Originally 12/21/1970)  . PNA vac Low Risk Adult (2 of 2 - PCV13) 04/04/2018  . INFLUENZA VACCINE  04/19/2018  . MAMMOGRAM  11/02/2019    Past Medical History:  Diagnosis Date  . Clotting disorder ([place])   . Hyperlipidemia   . Hypertension   . Peripheral vascular disease ([place])    vein surgery, left leg  . Stroke [facility]) 2002   ongoing mild loss of feeling in left side, had to learn to write right-handed  . Substance abuse ([place])   . Tobacco abuse     Past Surgical History:  Procedure Laterality Date  . AORTA - BILATERAL FEMORAL ARTERY BYPASS GRAFT N/A 07/14/2016   Procedure: AORTOBIFEMORAL BYPASS GRAFT;  Surgeon: Waynetta Sandy, MD;  Location: [place];  Service: Vascular;  Laterality:  N/A;  . CARDIAC CATHETERIZATION N/A 07/11/2016   Procedure: Left Heart Cath and Coronary Angiography;  Surgeon: Nelva Bush, MD;  Location: [place] [facility];  Service: Cardiovascular;  Laterality: N/A;  . FEMORAL-POPLITEAL BYPASS GRAFT Left 07/14/2016   Procedure: REDO BYPASS GRAFT FEMORAL-POPLITEAL ARTERY;  Surgeon: Waynetta Sandy, MD;  Location: [place];  Service: Vascular;  Laterality: Left;  . PERIPHERAL VASCULAR CATHETERIZATION N/A 07/08/2016   Procedure: Abdominal Aortogram w/ Bilateral Lower Extremity Runoff;  Surgeon: Elam Dutch, MD;  Location: [place] [facility];  Service: Cardiovascular;  Laterality: N/A;  . VEIN SURGERY Left     Family History  Problem Relation Age of Onset  . Hypertension Mother 45  . Cancer Brother   . Cancer Sister   . Colon cancer Neg Hx   . Colon polyps Neg Hx   . Esophageal cancer Neg Hx   . Rectal cancer Neg Hx   . Stomach cancer Neg Hx    Family Status  Relation Name Status  . Father  Deceased at age 17       Unknown  . Mother  Deceased at age 24       [place] Death  . Sister  Alive  . Brother  Deceased  . Sister  Alive  . Sister  Deceased  . Sister  Deceased  . Brother  Alive  . Brother  Deceased  . Sister  Alive  . Sister  Alive  . Neg Hx  (Not Specified)    Social History   Socioeconomic History  . Marital status: Widowed    Spouse name: Not on file  . Number of children: Not on file  . Years of education: Not on file  . Highest education level: Not on file  Occupational History  . Occupation: retired  Scientific laboratory technician  . Financial resource strain: Not hard at all  . Food insecurity:    Worry: Never true    Inability: Never true  . Transportation needs:    Medical: No    Non-medical: No  Tobacco Use  . Smoking status: Former Smoker    Years: 25.00    Types: Cigarettes    Last attempt to quit: 05/20/2016    Years since quitting: 1.7  . Smokeless tobacco: Never Used  . Tobacco comment: Has not smoked  since being discharged from hospital   Substance and Sexual Activity  . Alcohol use: Yes    Comment: 2-3 times a year  . Drug use: Yes    Types: Marijuana    Comment: when she can't sleep sometimes at night  . Sexual activity:  Not Currently  Lifestyle  . Physical activity:    Days per week: 0 days    Minutes per session: 0 min  . Stress: Only a little  Relationships  . Social connections:    Talks on phone: Three times a week    Gets together: Three times a week    Attends religious service: 1 to 4 times per year    Active member of club or organization: No    Attends meetings of clubs or organizations: Never    Relationship status: Widowed  . Intimate partner violence:    Fear of current or ex partner: No    Emotionally abused: No    Physically abused: No    Forced sexual activity: No  Other Topics Concern  . Not on file  Social History Narrative   DIET: None      DO YOU DRINK/EAT THINGS WITH CAFFEINE: no      MARITAL STATUS: Widow      WHAT YEAR WERE YOU MARRIED: 1990      DO YOU LIVE IN A HOUSE, APARTMENT, ASSISTED LIVING, CONDO TRAILER ETC.:  House      IS IT ONE OR MORE STORIES: One      HOW MANY PERSONS LIVE IN YOUR HOME: 3      DO YOU HAVE PETS IN YOUR HOME: no      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      DO YOU EXERCISE: sometimes      WHAT TYPE AND HOW OFTEN: once a week    Allergies  Allergen Reactions  . Penicillins Swelling    Has patient had a PCN reaction causing immediate rash, facial/tongue/throat swelling, SOB or lightheadedness with hypotension: YES Has patient had a PCN reaction causing severe rash involving mucus membranes or skin necrosis: NO Has patient had a PCN reaction that required hospitalization NO Has patient had a PCN reaction occurring within the last 10 years: NO If all of the above answers are "NO", then may proceed with Cephalosporin use.    Allergies as of 03/02/2018      Reactions   Penicillins Swelling   Has patient  had a PCN reaction causing immediate rash, facial/tongue/throat swelling, SOB or lightheadedness with hypotension: YES Has patient had a PCN reaction causing severe rash involving mucus membranes or skin necrosis: NO Has patient had a PCN reaction that required hospitalization NO Has patient had a PCN reaction occurring within the last 10 years: NO If all of the above answers are "NO", then may proceed with Cephalosporin use.      Medication List        Accurate as of 03/02/18  9:53 AM. Always use your most recent med list.          aspirin 81 MG EC tablet TAKE 1 TABLET BY MOUTH EVERY DAY   atorvastatin 80 MG tablet Commonly known as:  LIPITOR Take 1 tablet (80 mg total) by mouth daily.   carvedilol 3.125 MG tablet Commonly known as:  COREG TAKE 1 TABLET (3.125 MG TOTAL) BY MOUTH 2 (TWO) TIMES DAILY WITH A MEAL.   cloNIDine 0.2 MG tablet Commonly known as:  CATAPRES Take 1 tablet (0.2 mg total) by mouth 2 (two) times daily.   hydrochlorothiazide 25 MG tablet Commonly known as:  HYDRODIURIL TAKE 1 TABLET BY MOUTH EVERY DAY   lisinopril 40 MG tablet Commonly known as:  PRINIVIL,ZESTRIL Take 1 tablet (40 mg total) by mouth daily.   PEG-KCl-NaCl-NaSulf-Na Asc-C 140 g Solr  Commonly known as:  PLENVU Take 1 kit by mouth as directed.   Zoster Vaccine Adjuvanted injection Commonly known as:  SHINGRIX Inject 0.5 mLs into the muscle once for 1 dose.        Review of Systems:  Review of Systems  Musculoskeletal: Positive for gait problem and joint swelling.  All other systems reviewed and are negative.   Physical Exam: Vitals:   03/02/18 0919  BP: 122/65  Pulse: 60  Temp: 97.9 F (36.6 C)  SpO2: 94%  Weight: 159 lb (72.1 kg)  Height: 5' 4"  (1.626 m)   Body mass index is 27.29 kg/m. Physical Exam  Constitutional: She is oriented to person, place, and time. She appears well-developed and well-nourished. No distress.  HENT:  Head: Normocephalic and atraumatic.   Right Ear: Hearing, tympanic membrane, external ear and ear canal normal.  Left Ear: Hearing, tympanic membrane, external ear and ear canal normal.  Mouth/Throat: Uvula is midline, oropharynx is clear and moist and mucous membranes are normal. She does not have dentures.  Eyes: Pupils are equal, round, and reactive to light. Conjunctivae, EOM and lids are normal. No scleral icterus.  Neck: Trachea normal and normal range of motion. Neck supple. Carotid bruit is not present. No thyroid mass and no thyromegaly present.  Cardiovascular: Normal rate, regular rhythm and intact distal pulses. Exam reveals no gallop and no friction rub.  Murmur (1/6 SEM) heard. No LE edema b/l. No calf TTP.   Pulmonary/Chest: Effort normal and breath sounds normal. She has no wheezes. She has no rhonchi. She has no rales. She exhibits no mass. Right breast exhibits no inverted nipple, no mass, no nipple discharge, no skin change and no tenderness. Left breast exhibits no inverted nipple, no mass, no nipple discharge, no skin change and no tenderness. Breasts are symmetrical.  Abdominal: Soft. Normal appearance, normal aorta and bowel sounds are normal. She exhibits no pulsatile midline mass and no mass. There is no hepatosplenomegaly. There is no tenderness. There is no rigidity, no rebound and no guarding. No hernia.  obese  Genitourinary:  Genitourinary Comments: She declines pelvic/pap smear  Musculoskeletal: Normal range of motion. She exhibits edema (left ankle/foot) and deformity (distal tip left 2nd toe).  Lymphadenopathy:       Head (right side): No posterior auricular adenopathy present.       Head (left side): No posterior auricular adenopathy present.    She has no cervical adenopathy.       Right: No supraclavicular adenopathy present.       Left: No supraclavicular adenopathy present.  Neurological: She is alert and oriented to person, place, and time. She has normal strength and normal reflexes. No  cranial nerve deficit. Gait (uses 4 prong cane) abnormal.  Left hemiparesis  Skin: Skin is warm, dry and intact. No rash noted. Nails show no clubbing.  Psychiatric: She has a normal mood and affect. Her speech is normal and behavior is normal. Thought content normal. Cognition and memory are normal.    Labs reviewed:  Basic Metabolic Panel: Recent Labs    04/04/17 1002 10/11/17 1210  NA 137 140  K 4.3 4.0  CL 103 104  CO2 25 29  GLUCOSE 98 102*  BUN 28* 17  CREATININE 1.63* 1.28*  CALCIUM 9.8 9.6   Liver Function Tests: Recent Labs    04/04/17 1002 05/05/17 0826 10/11/17 1210  AST 15  --  16  ALT 7 9 11   ALKPHOS 123  --   --  BILITOT 0.6  --  0.8  PROT 7.8  --  7.5  ALBUMIN 4.5  --   --    No results for input(s): LIPASE, AMYLASE in the last 8760 hours. No results for input(s): AMMONIA in the last 8760 hours. CBC: No results for input(s): WBC, NEUTROABS, HGB, HCT, MCV, PLT in the last 8760 hours. Lipid Panel: Recent Labs    04/04/17 1002 05/05/17 0826 10/11/17 1210  CHOL 278* 120 132  HDL 44* 39* 45*  LDLCALC 197* 56 67  TRIG 187* 127 116  CHOLHDL 6.3* 3.1 2.9   Lab Results  Component Value Date   HGBA1C 5.9 (H) 10/11/2017    Procedures: No results found.  Assessment/Plan   ICD-10-CM   1. Well adult exam Z00.00   2. History of stroke Z86.73 aspirin 81 MG EC tablet  3. Mixed hyperlipidemia E78.2 Lipid Panel    TSH  4. Left hemiparesis ([place]) G81.94   5. Essential hypertension I10 CMP with eGFR([facility])    CBC with Differential/Platelets    Urinalysis with Reflex Microscopic  6. PAD (peripheral artery disease) (HCC) I73.9 CMP with eGFR([facility])    CBC with Differential/Platelets  7. High risk medication use Z79.899 CMP with eGFR([facility])    CBC with Differential/Platelets  8. Hypokalemia E87.6     Pt declines colonoscopy but ok for cologuard  She declines pelvic/pap smear today  Continue current medications as ordered  Will call with lab  results  Follow up with specialists as scheduled  RECOMMEND [facility]  Follow up in 3 mos for HTN, hx CVA, hyperlipidemia, PAD, hypokalemia  Keeping You Healthy handout given  Cordella Register. Perlie Gold  [facility] [address] [place], [place] [address] [phone] Cell (Monday-Friday 8 AM - 5 PM) [phone] After 5 PM and follow prompts

## 2018-03-02 NOTE — Patient Instructions (Signed)
Wendy Townsend , Thank you for taking time to come for your Medicare Wellness Visit. I appreciate your ongoing commitment to your health goals. Please review the following plan we discussed and let me know if I can assist you in the future.   Screening recommendations/referrals: Colonoscopy/cologuard due- cologuard information signed Mammogram up to date, due 11/01/2018 Bone Density due, declined Recommended yearly ophthalmology/optometry visit for glaucoma screening and checkup Recommended yearly dental visit for hygiene and checkup  Vaccinations: Influenza vaccine up to date, due 2019 fall season Pneumococcal vaccine up to date. Second shot due after 04/04/2018 Tdap vaccine due, ordered to pharmacy Shingles vaccine due, declined    Advanced directives: Advance directive discussed with you today. I have provided a copy for you to complete at home and have notarized. Once this is complete please bring a copy in to our office so we can scan it into your chart.  Conditions/risks identified: none  Next appointment: Tyson Dense, RN 03/06/2019 @ 9:15am   Preventive Care 65 Years and Older, Female Preventive care refers to lifestyle choices and visits with your health care provider that can promote health and wellness. What does preventive care include?  A yearly physical exam. This is also called an annual well check.  Dental exams once or twice a year.  Routine eye exams. Ask your health care provider how often you should have your eyes checked.  Personal lifestyle choices, including:  Daily care of your teeth and gums.  Regular physical activity.  Eating a healthy diet.  Avoiding tobacco and drug use.  Limiting alcohol use.  Practicing safe sex.  Taking low-dose aspirin every day.  Taking vitamin and mineral supplements as recommended by your health care provider. What happens during an annual well check? The services and screenings done by your health care provider  during your annual well check will depend on your age, overall health, lifestyle risk factors, and family history of disease. Counseling  Your health care provider may ask you questions about your:  Alcohol use.  Tobacco use.  Drug use.  Emotional well-being.  Home and relationship well-being.  Sexual activity.  Eating habits.  History of falls.  Memory and ability to understand (cognition).  Work and work Statistician.  Reproductive health. Screening  You may have the following tests or measurements:  Height, weight, and BMI.  Blood pressure.  Lipid and cholesterol levels. These may be checked every 5 years, or more frequently if you are over 62 years old.  Skin check.  Lung cancer screening. You may have this screening every year starting at age 80 if you have a 30-pack-year history of smoking and currently smoke or have quit within the past 15 years.  Fecal occult blood test (FOBT) of the stool. You may have this test every year starting at age 63.  Flexible sigmoidoscopy or colonoscopy. You may have a sigmoidoscopy every 5 years or a colonoscopy every 10 years starting at age 49.  Hepatitis C blood test.  Hepatitis B blood test.  Sexually transmitted disease (STD) testing.  Diabetes screening. This is done by checking your blood sugar (glucose) after you have not eaten for a while (fasting). You may have this done every 1-3 years.  Bone density scan. This is done to screen for osteoporosis. You may have this done starting at age 38.  Mammogram. This may be done every 1-2 years. Talk to your health care provider about how often you should have regular mammograms. Talk with your health care provider  about your test results, treatment options, and if necessary, the need for more tests. Vaccines  Your health care provider may recommend certain vaccines, such as:  Influenza vaccine. This is recommended every year.  Tetanus, diphtheria, and acellular pertussis  (Tdap, Td) vaccine. You may need a Td booster every 10 years.  Zoster vaccine. You may need this after age 56.  Pneumococcal 13-valent conjugate (PCV13) vaccine. One dose is recommended after age 40.  Pneumococcal polysaccharide (PPSV23) vaccine. One dose is recommended after age 103. Talk to your health care provider about which screenings and vaccines you need and how often you need them. This information is not intended to replace advice given to you by your health care provider. Make sure you discuss any questions you have with your health care provider. Document Released: 10/02/2015 Document Revised: 05/25/2016 Document Reviewed: 07/07/2015 Elsevier Interactive Patient Education  2017 [employer] Prevention in the Home Falls can cause injuries. They can happen to people of all ages. There are many things you can do to make your home safe and to help prevent falls. What can I do on the outside of my home?  Regularly fix the edges of walkways and driveways and fix any cracks.  Remove anything that might make you trip as you walk through a door, such as a raised step or threshold.  Trim any bushes or trees on the path to your home.  Use bright outdoor lighting.  Clear any walking paths of anything that might make someone trip, such as rocks or tools.  Regularly check to see if handrails are loose or broken. Make sure that both sides of any steps have handrails.  Any raised decks and porches should have guardrails on the edges.  Have any leaves, snow, or ice cleared regularly.  Use sand or salt on walking paths during winter.  Clean up any spills in your garage right away. This includes oil or grease spills. What can I do in the bathroom?  Use night lights.  Install grab bars by the toilet and in the tub and shower. Do not use towel bars as grab bars.  Use non-skid mats or decals in the tub or shower.  If you need to sit down in the shower, use a plastic, non-slip  stool.  Keep the floor dry. Clean up any water that spills on the floor as soon as it happens.  Remove soap buildup in the tub or shower regularly.  Attach bath mats securely with double-sided non-slip rug tape.  Do not have throw rugs and other things on the floor that can make you trip. What can I do in the bedroom?  Use night lights.  Make sure that you have a light by your bed that is easy to reach.  Do not use any sheets or blankets that are too big for your bed. They should not hang down onto the floor.  Have a firm chair that has side arms. You can use this for support while you get dressed.  Do not have throw rugs and other things on the floor that can make you trip. What can I do in the kitchen?  Clean up any spills right away.  Avoid walking on wet floors.  Keep items that you use a lot in easy-to-reach places.  If you need to reach something above you, use a strong step stool that has a grab bar.  Keep electrical cords out of the way.  Do not use floor polish or  wax that makes floors slippery. If you must use wax, use non-skid floor wax.  Do not have throw rugs and other things on the floor that can make you trip. What can I do with my stairs?  Do not leave any items on the stairs.  Make sure that there are handrails on both sides of the stairs and use them. Fix handrails that are broken or loose. Make sure that handrails are as long as the stairways.  Check any carpeting to make sure that it is firmly attached to the stairs. Fix any carpet that is loose or worn.  Avoid having throw rugs at the top or bottom of the stairs. If you do have throw rugs, attach them to the floor with carpet tape.  Make sure that you have a light switch at the top of the stairs and the bottom of the stairs. If you do not have them, ask someone to add them for you. What else can I do to help prevent falls?  Wear shoes that:  Do not have high heels.  Have rubber bottoms.  Are  comfortable and fit you well.  Are closed at the toe. Do not wear sandals.  If you use a stepladder:  Make sure that it is fully opened. Do not climb a closed stepladder.  Make sure that both sides of the stepladder are locked into place.  Ask someone to hold it for you, if possible.  Clearly mark and make sure that you can see:  Any grab bars or handrails.  First and last steps.  Where the edge of each step is.  Use tools that help you move around (mobility aids) if they are needed. These include:  Canes.  Walkers.  Scooters.  Crutches.  Turn on the lights when you go into a dark area. Replace any light bulbs as soon as they burn out.  Set up your furniture so you have a clear path. Avoid moving your furniture around.  If any of your floors are uneven, fix them.  If there are any pets around you, be aware of where they are.  Review your medicines with your doctor. Some medicines can make you feel dizzy. This can increase your chance of falling. Ask your doctor what other things that you can do to help prevent falls. This information is not intended to replace advice given to you by your health care provider. Make sure you discuss any questions you have with your health care provider. Document Released: 07/02/2009 Document Revised: 02/11/2016 Document Reviewed: 10/10/2014 Elsevier Interactive Patient Education  2017 [employer].

## 2018-03-03 LAB — TSH: TSH: 1.45 mIU/L (ref 0.40–4.50)

## 2018-03-03 LAB — COMPLETE METABOLIC PANEL WITH GFR
AG RATIO: 1.6 (calc) (ref 1.0–2.5)
ALBUMIN MSPROF: 4.5 g/dL (ref 3.6–5.1)
ALKALINE PHOSPHATASE (APISO): 150 U/L — AB (ref 33–130)
ALT: 9 U/L (ref 6–29)
AST: 15 U/L (ref 10–35)
BILIRUBIN TOTAL: 0.7 mg/dL (ref 0.2–1.2)
BUN/Creatinine Ratio: 14 (calc) (ref 6–22)
BUN: 18 mg/dL (ref 7–25)
CHLORIDE: 105 mmol/L (ref 98–110)
CO2: 26 mmol/L (ref 20–32)
Calcium: 9.5 mg/dL (ref 8.6–10.4)
Creat: 1.25 mg/dL — ABNORMAL HIGH (ref 0.50–0.99)
GFR, EST AFRICAN AMERICAN: 52 mL/min/{1.73_m2} — AB (ref >=60)
GFR, Est Non African American: 45 mL/min/{1.73_m2} — ABNORMAL LOW (ref >=60)
GLUCOSE: 99 mg/dL (ref 65–99)
Globulin: 2.9 g/dL (calc) (ref 1.9–3.7)
POTASSIUM: 4.2 mmol/L (ref 3.5–5.3)
SODIUM: 140 mmol/L (ref 135–146)
Total Protein: 7.4 g/dL (ref 6.1–8.1)

## 2018-03-03 LAB — URINALYSIS, ROUTINE W REFLEX MICROSCOPIC
BILIRUBIN URINE: NEGATIVE
Bacteria, UA: NONE SEEN /HPF
GLUCOSE, UA: NEGATIVE
Hyaline Cast: NONE SEEN /LPF
KETONES UR: NEGATIVE
NITRITE: NEGATIVE
PROTEIN: NEGATIVE
Specific Gravity, Urine: 1.016 (ref 1.001–1.03)
pH: 6 (ref 5.0–8.0)

## 2018-03-03 LAB — CBC WITH DIFFERENTIAL/PLATELET
BASOS PCT: 0.8 %
Basophils Absolute: 57 cells/uL (ref 0–200)
EOS ABS: 192 {cells}/uL (ref 15–500)
Eosinophils Relative: 2.7 %
HEMATOCRIT: 35.8 % (ref 35.0–45.0)
HEMOGLOBIN: 12.8 g/dL (ref 11.7–15.5)
LYMPHS ABS: 2308 {cells}/uL (ref 850–3900)
MCH: 32.8 pg (ref 27.0–33.0)
MCHC: 35.8 g/dL (ref 32.0–36.0)
MCV: 91.8 fL (ref 80.0–100.0)
MPV: 10.8 fL (ref 7.5–12.5)
Monocytes Relative: 8.6 %
NEUTROS ABS: 3933 {cells}/uL (ref 1500–7800)
Neutrophils Relative %: 55.4 %
PLATELETS: 197 10*3/uL (ref 140–400)
RBC: 3.9 10*6/uL (ref 3.80–5.10)
RDW: 13 % (ref 11.0–15.0)
Total Lymphocyte: 32.5 %
WBC: 7.1 10*3/uL (ref 3.8–10.8)
WBCMIX: 611 {cells}/uL (ref 200–950)

## 2018-03-03 LAB — LIPID PANEL
Cholesterol: 128 mg/dL (ref <200)
HDL: 33 mg/dL — ABNORMAL LOW (ref >50)
LDL CHOLESTEROL (CALC): 71 mg/dL
Non-HDL Cholesterol (Calc): 95 mg/dL (calc) (ref <130)
Total CHOL/HDL Ratio: 3.9 (calc) (ref <5.0)
Triglycerides: 162 mg/dL — ABNORMAL HIGH (ref <150)

## 2018-05-09 ENCOUNTER — Encounter: Payer: Self-pay | Admitting: Internal Medicine

## 2018-10-03 ENCOUNTER — Emergency Department (HOSPITAL_COMMUNITY): Payer: Medicare HMO | Admitting: Certified Registered Nurse Anesthetist

## 2018-10-03 ENCOUNTER — Encounter (HOSPITAL_COMMUNITY): Payer: Self-pay | Admitting: Certified Registered Nurse Anesthetist

## 2018-10-03 ENCOUNTER — Encounter (HOSPITAL_COMMUNITY)
Admission: EM | Disposition: A | Discharge status: Skilled Nursing Facility | Payer: Self-pay | Source: Home / Self Care | Attending: Vascular Surgery

## 2018-10-03 ENCOUNTER — Inpatient Hospital Stay (HOSPITAL_COMMUNITY)
Admission: EM | Admit: 2018-10-03 | Discharge: 2018-10-11 | DRG: 253 | Disposition: A | Discharge status: Skilled Nursing Facility | Payer: Medicare HMO | Attending: Vascular Surgery | Admitting: Vascular Surgery

## 2018-10-03 ENCOUNTER — Other Ambulatory Visit: Payer: Self-pay

## 2018-10-03 ENCOUNTER — Emergency Department (HOSPITAL_COMMUNITY): Payer: Medicare HMO

## 2018-10-03 DIAGNOSIS — I745 Embolism and thrombosis of iliac artery: Secondary | ICD-10-CM | POA: Diagnosis not present

## 2018-10-03 DIAGNOSIS — N183 Chronic kidney disease, stage 3 (moderate): Secondary | ICD-10-CM | POA: Diagnosis not present

## 2018-10-03 DIAGNOSIS — D62 Acute posthemorrhagic anemia: Secondary | ICD-10-CM | POA: Diagnosis not present

## 2018-10-03 DIAGNOSIS — M62262 Nontraumatic ischemic infarction of muscle, left lower leg: Secondary | ICD-10-CM | POA: Diagnosis present

## 2018-10-03 DIAGNOSIS — Y832 Surgical operation with anastomosis, bypass or graft as the cause of abnormal reaction of the patient, or of later complication, without mention of misadventure at the time of the procedure: Secondary | ICD-10-CM | POA: Diagnosis present

## 2018-10-03 DIAGNOSIS — Z8249 Family history of ischemic heart disease and other diseases of the circulatory system: Secondary | ICD-10-CM | POA: Diagnosis not present

## 2018-10-03 DIAGNOSIS — R0989 Other specified symptoms and signs involving the circulatory and respiratory systems: Secondary | ICD-10-CM | POA: Diagnosis not present

## 2018-10-03 DIAGNOSIS — Z880 Allergy status to penicillin: Secondary | ICD-10-CM

## 2018-10-03 DIAGNOSIS — T82868A Thrombosis of vascular prosthetic devices, implants and grafts, initial encounter: Secondary | ICD-10-CM | POA: Diagnosis not present

## 2018-10-03 DIAGNOSIS — D631 Anemia in chronic kidney disease: Secondary | ICD-10-CM | POA: Diagnosis present

## 2018-10-03 DIAGNOSIS — E785 Hyperlipidemia, unspecified: Secondary | ICD-10-CM | POA: Diagnosis not present

## 2018-10-03 DIAGNOSIS — I998 Other disorder of circulatory system: Secondary | ICD-10-CM | POA: Diagnosis not present

## 2018-10-03 DIAGNOSIS — T82898A Other specified complication of vascular prosthetic devices, implants and grafts, initial encounter: Secondary | ICD-10-CM | POA: Diagnosis not present

## 2018-10-03 DIAGNOSIS — Z79899 Other long term (current) drug therapy: Secondary | ICD-10-CM

## 2018-10-03 DIAGNOSIS — I739 Peripheral vascular disease, unspecified: Secondary | ICD-10-CM | POA: Diagnosis not present

## 2018-10-03 DIAGNOSIS — I2510 Atherosclerotic heart disease of native coronary artery without angina pectoris: Secondary | ICD-10-CM | POA: Diagnosis present

## 2018-10-03 DIAGNOSIS — R202 Paresthesia of skin: Secondary | ICD-10-CM | POA: Diagnosis not present

## 2018-10-03 DIAGNOSIS — I129 Hypertensive chronic kidney disease with stage 1 through stage 4 chronic kidney disease, or unspecified chronic kidney disease: Secondary | ICD-10-CM | POA: Diagnosis present

## 2018-10-03 DIAGNOSIS — R279 Unspecified lack of coordination: Secondary | ICD-10-CM | POA: Diagnosis not present

## 2018-10-03 DIAGNOSIS — Z743 Need for continuous supervision: Secondary | ICD-10-CM | POA: Diagnosis not present

## 2018-10-03 DIAGNOSIS — Z7982 Long term (current) use of aspirin: Secondary | ICD-10-CM | POA: Diagnosis not present

## 2018-10-03 DIAGNOSIS — I69354 Hemiplegia and hemiparesis following cerebral infarction affecting left non-dominant side: Secondary | ICD-10-CM | POA: Diagnosis not present

## 2018-10-03 DIAGNOSIS — Z87891 Personal history of nicotine dependence: Secondary | ICD-10-CM

## 2018-10-03 DIAGNOSIS — R5381 Other malaise: Secondary | ICD-10-CM | POA: Diagnosis not present

## 2018-10-03 DIAGNOSIS — R531 Weakness: Secondary | ICD-10-CM | POA: Diagnosis not present

## 2018-10-03 DIAGNOSIS — I96 Gangrene, not elsewhere classified: Secondary | ICD-10-CM

## 2018-10-03 DIAGNOSIS — R262 Difficulty in walking, not elsewhere classified: Secondary | ICD-10-CM | POA: Diagnosis not present

## 2018-10-03 DIAGNOSIS — M6281 Muscle weakness (generalized): Secondary | ICD-10-CM | POA: Diagnosis not present

## 2018-10-03 DIAGNOSIS — M79672 Pain in left foot: Secondary | ICD-10-CM | POA: Diagnosis not present

## 2018-10-03 DIAGNOSIS — M24562 Contracture, left knee: Secondary | ICD-10-CM | POA: Diagnosis present

## 2018-10-03 DIAGNOSIS — Z95820 Peripheral vascular angioplasty status with implants and grafts: Secondary | ICD-10-CM | POA: Diagnosis not present

## 2018-10-03 HISTORY — PX: AORTA - BILATERAL FEMORAL ARTERY BYPASS GRAFT: SHX1175

## 2018-10-03 LAB — POCT I-STAT 4, (NA,K, GLUC, HGB,HCT)
Glucose, Bld: 110 mg/dL — ABNORMAL HIGH (ref 70–99)
HCT: 23 % — ABNORMAL LOW (ref 36.0–46.0)
Hemoglobin: 7.8 g/dL — ABNORMAL LOW (ref 12.0–15.0)
Potassium: 3.2 mmol/L — ABNORMAL LOW (ref 3.5–5.1)
Sodium: 139 mmol/L (ref 135–145)

## 2018-10-03 LAB — CBC WITH DIFFERENTIAL/PLATELET
Abs Immature Granulocytes: 0.03 10*3/uL (ref 0.00–0.07)
Basophils Absolute: 0 10*3/uL (ref 0.0–0.1)
Basophils Relative: 1 %
EOS PCT: 1 %
Eosinophils Absolute: 0.1 10*3/uL (ref 0.0–0.5)
HCT: 36.4 % (ref 36.0–46.0)
Hemoglobin: 12.3 g/dL (ref 12.0–15.0)
Immature Granulocytes: 0 %
LYMPHS PCT: 22 %
Lymphs Abs: 1.7 10*3/uL (ref 0.7–4.0)
MCH: 31.7 pg (ref 26.0–34.0)
MCHC: 33.8 g/dL (ref 30.0–36.0)
MCV: 93.8 fL (ref 80.0–100.0)
Monocytes Absolute: 0.8 10*3/uL (ref 0.1–1.0)
Monocytes Relative: 11 %
NEUTROS ABS: 4.9 10*3/uL (ref 1.7–7.7)
NRBC: 0 % (ref 0.0–0.2)
Neutrophils Relative %: 65 %
PLATELETS: 179 10*3/uL (ref 150–400)
RBC: 3.88 MIL/uL (ref 3.87–5.11)
RDW: 12 % (ref 11.5–15.5)
WBC: 7.6 10*3/uL (ref 4.0–10.5)

## 2018-10-03 LAB — BASIC METABOLIC PANEL
Anion gap: 15 (ref 5–15)
BUN: 29 mg/dL — AB (ref 8–23)
CO2: 21 mmol/L — ABNORMAL LOW (ref 22–32)
CREATININE: 1.66 mg/dL — AB (ref 0.44–1.00)
Calcium: 9.8 mg/dL (ref 8.9–10.3)
Chloride: 101 mmol/L (ref 98–111)
GFR calc Af Amer: 37 mL/min — ABNORMAL LOW (ref >60)
GFR, EST NON AFRICAN AMERICAN: 32 mL/min — AB (ref >60)
Glucose, Bld: 124 mg/dL — ABNORMAL HIGH (ref 70–99)
Potassium: 3.7 mmol/L (ref 3.5–5.1)
SODIUM: 137 mmol/L (ref 135–145)

## 2018-10-03 LAB — PREPARE RBC (CROSSMATCH)

## 2018-10-03 SURGERY — CREATION, BYPASS, ARTERIAL, AORTA TO FEMORAL, BILATERAL, USING GRAFT
Anesthesia: General | Site: Leg Upper | Laterality: Left

## 2018-10-03 MED ORDER — ACETAMINOPHEN 325 MG RE SUPP: 325.0000 mg | RECTAL | Status: DC | PRN

## 2018-10-03 MED ORDER — LACTATED RINGERS IV SOLN
INTRAVENOUS | Status: DC
  Administered 2018-10-03 (×2): via INTRAVENOUS

## 2018-10-03 MED ORDER — PANTOPRAZOLE SODIUM 40 MG PO TBEC
40.0000 mg | DELAYED_RELEASE_TABLET | Freq: Every day | ORAL | Status: DC
  Administered 2018-10-04 – 2018-10-11 (×8): 40 mg via ORAL
  Filled 2018-10-03 (×8): qty 1

## 2018-10-03 MED ORDER — ATORVASTATIN CALCIUM 80 MG PO TABS
80.0000 mg | ORAL_TABLET | Freq: Every day | ORAL | Status: DC
  Administered 2018-10-04 – 2018-10-11 (×8): 80 mg via ORAL
  Filled 2018-10-03 (×8): qty 1

## 2018-10-03 MED ORDER — SODIUM CHLORIDE 0.9 % IV SOLN
INTRAVENOUS | Status: DC | PRN
  Administered 2018-10-03: 10 ug/min via INTRAVENOUS

## 2018-10-03 MED ORDER — ALUM & MAG HYDROXIDE-SIMETH 200-200-20 MG/5ML PO SUSP: 15.0000 mL | ORAL | Status: DC | PRN

## 2018-10-03 MED ORDER — HYDROCHLOROTHIAZIDE 25 MG PO TABS
25.0000 mg | ORAL_TABLET | Freq: Every day | ORAL | Status: DC
  Administered 2018-10-04 – 2018-10-11 (×8): 25 mg via ORAL
  Filled 2018-10-03 (×8): qty 1

## 2018-10-03 MED ORDER — MAGNESIUM SULFATE 2 GM/50ML IV SOLN: 2.0000 g | Freq: Every day | INTRAVENOUS | Status: DC | PRN

## 2018-10-03 MED ORDER — STERILE WATER FOR IRRIGATION IR SOLN
Status: DC | PRN
  Administered 2018-10-03: 1000 mL

## 2018-10-03 MED ORDER — PHENOL 1.4 % MT LIQD: 1.0000 | OROMUCOSAL | Status: DC | PRN

## 2018-10-03 MED ORDER — SODIUM CHLORIDE 0.9 % IV SOLN: 500.0000 mL | Freq: Once | INTRAVENOUS | Status: DC | PRN

## 2018-10-03 MED ORDER — BISACODYL 5 MG PO TBEC
5.0000 mg | DELAYED_RELEASE_TABLET | Freq: Every day | ORAL | Status: DC | PRN
  Administered 2018-10-09: 5 mg via ORAL
  Filled 2018-10-03: qty 1

## 2018-10-03 MED ORDER — FENTANYL CITRATE (PF) 100 MCG/2ML IJ SOLN
INTRAMUSCULAR | Status: DC | PRN
  Administered 2018-10-03 (×2): 100 ug via INTRAVENOUS

## 2018-10-03 MED ORDER — ALBUMIN HUMAN 5 % IV SOLN
INTRAVENOUS | Status: DC | PRN
  Administered 2018-10-03 (×2): via INTRAVENOUS

## 2018-10-03 MED ORDER — FENTANYL CITRATE (PF) 100 MCG/2ML IJ SOLN: 25.0000 ug | INTRAMUSCULAR | Status: DC | PRN

## 2018-10-03 MED ORDER — POTASSIUM CHLORIDE CRYS ER 20 MEQ PO TBCR: 20.0000 meq | EXTENDED_RELEASE_TABLET | Freq: Every day | ORAL | Status: DC | PRN

## 2018-10-03 MED ORDER — SODIUM CHLORIDE 0.9% IV SOLUTION: Freq: Once | INTRAVENOUS | Status: DC

## 2018-10-03 MED ORDER — 0.9 % SODIUM CHLORIDE (POUR BTL) OPTIME
TOPICAL | Status: DC | PRN
  Administered 2018-10-03: 3000 mL

## 2018-10-03 MED ORDER — OXYCODONE HCL 5 MG PO TABS: 5.0000 mg | ORAL_TABLET | Freq: Once | ORAL | Status: DC | PRN

## 2018-10-03 MED ORDER — DOCUSATE SODIUM 100 MG PO CAPS
100.0000 mg | ORAL_CAPSULE | Freq: Every day | ORAL | Status: DC
  Administered 2018-10-04 – 2018-10-11 (×7): 100 mg via ORAL
  Filled 2018-10-03 (×8): qty 1

## 2018-10-03 MED ORDER — METOPROLOL TARTRATE 5 MG/5ML IV SOLN: 2.0000 mg | INTRAVENOUS | Status: DC | PRN

## 2018-10-03 MED ORDER — SENNOSIDES-DOCUSATE SODIUM 8.6-50 MG PO TABS: 1.0000 | ORAL_TABLET | Freq: Every evening | ORAL | Status: DC | PRN

## 2018-10-03 MED ORDER — HEPARIN SODIUM (PORCINE) 1000 UNIT/ML IJ SOLN
INTRAMUSCULAR | Status: DC | PRN
  Administered 2018-10-03: 4000 [IU] via INTRAVENOUS
  Administered 2018-10-03: 7000 [IU] via INTRAVENOUS

## 2018-10-03 MED ORDER — LABETALOL HCL 5 MG/ML IV SOLN: 10.0000 mg | INTRAVENOUS | Status: DC | PRN

## 2018-10-03 MED ORDER — FENTANYL CITRATE (PF) 250 MCG/5ML IJ SOLN
INTRAMUSCULAR | Status: AC
  Filled 2018-10-03: qty 5

## 2018-10-03 MED ORDER — SODIUM CHLORIDE 0.9 % IV SOLN
INTRAVENOUS | Status: DC | PRN
  Administered 2018-10-03: 500 mL

## 2018-10-03 MED ORDER — CLONIDINE HCL 0.2 MG PO TABS
0.2000 mg | ORAL_TABLET | Freq: Two times a day (BID) | ORAL | Status: DC
  Administered 2018-10-03 – 2018-10-11 (×16): 0.2 mg via ORAL
  Filled 2018-10-03 (×16): qty 1

## 2018-10-03 MED ORDER — VANCOMYCIN HCL IN DEXTROSE 1-5 GM/200ML-% IV SOLN
1000.0000 mg | Freq: Two times a day (BID) | INTRAVENOUS | Status: AC
  Administered 2018-10-04: 1000 mg via INTRAVENOUS
  Filled 2018-10-03: qty 200

## 2018-10-03 MED ORDER — CARVEDILOL 3.125 MG PO TABS
3.1250 mg | ORAL_TABLET | Freq: Two times a day (BID) | ORAL | Status: DC
  Administered 2018-10-04 – 2018-10-11 (×15): 3.125 mg via ORAL
  Filled 2018-10-03 (×15): qty 1

## 2018-10-03 MED ORDER — ONDANSETRON HCL 4 MG/2ML IJ SOLN
INTRAMUSCULAR | Status: DC | PRN
  Administered 2018-10-03: 4 mg via INTRAVENOUS

## 2018-10-03 MED ORDER — OXYCODONE HCL 5 MG PO TABS
5.0000 mg | ORAL_TABLET | ORAL | Status: DC | PRN
  Administered 2018-10-03 – 2018-10-11 (×29): 10 mg via ORAL
  Filled 2018-10-03 (×29): qty 2

## 2018-10-03 MED ORDER — VANCOMYCIN HCL IN DEXTROSE 1-5 GM/200ML-% IV SOLN
1000.0000 mg | INTRAVENOUS | Status: AC
  Administered 2018-10-03: 1000 mg via INTRAVENOUS

## 2018-10-03 MED ORDER — PROPOFOL 10 MG/ML IV BOLUS
INTRAVENOUS | Status: DC | PRN
  Administered 2018-10-03: 50 mg via INTRAVENOUS
  Administered 2018-10-03: 130 mg via INTRAVENOUS

## 2018-10-03 MED ORDER — VANCOMYCIN HCL 1000 MG IV SOLR: 1000.0000 mg | Freq: Once | INTRAVENOUS | Status: DC

## 2018-10-03 MED ORDER — MIDAZOLAM HCL 5 MG/5ML IJ SOLN
INTRAMUSCULAR | Status: DC | PRN
  Administered 2018-10-03: 2 mg via INTRAVENOUS

## 2018-10-03 MED ORDER — PHENYLEPHRINE HCL 10 MG/ML IJ SOLN
INTRAMUSCULAR | Status: DC | PRN
  Administered 2018-10-03: 40 ug via INTRAVENOUS
  Administered 2018-10-03: 80 ug via INTRAVENOUS
  Administered 2018-10-03: 160 ug via INTRAVENOUS
  Administered 2018-10-03 (×4): 80 ug via INTRAVENOUS

## 2018-10-03 MED ORDER — IOPAMIDOL (ISOVUE-370) INJECTION 76%
100.0000 mL | Freq: Once | INTRAVENOUS | Status: AC | PRN
  Administered 2018-10-03: 80 mL via INTRAVENOUS

## 2018-10-03 MED ORDER — SODIUM CHLORIDE 0.9 % IV SOLN
10.0000 mL/h | Freq: Once | INTRAVENOUS | Status: AC
  Administered 2018-10-03: 19:00:00 via INTRAVENOUS

## 2018-10-03 MED ORDER — LISINOPRIL 40 MG PO TABS
40.0000 mg | ORAL_TABLET | Freq: Every day | ORAL | Status: DC
  Administered 2018-10-04 – 2018-10-05 (×2): 40 mg via ORAL
  Filled 2018-10-03 (×2): qty 1

## 2018-10-03 MED ORDER — EPHEDRINE SULFATE 50 MG/ML IJ SOLN
INTRAMUSCULAR | Status: DC | PRN
  Administered 2018-10-03: 5 mg via INTRAVENOUS

## 2018-10-03 MED ORDER — HYDROMORPHONE HCL 1 MG/ML IJ SOLN
1.0000 mg | Freq: Once | INTRAMUSCULAR | Status: AC
  Administered 2018-10-03: 1 mg via INTRAVENOUS
  Filled 2018-10-03: qty 1

## 2018-10-03 MED ORDER — PROTAMINE SULFATE 10 MG/ML IV SOLN
INTRAVENOUS | Status: DC | PRN
  Administered 2018-10-03: 20 mg via INTRAVENOUS
  Administered 2018-10-03: 30 mg via INTRAVENOUS
  Administered 2018-10-03: 20 mg via INTRAVENOUS
  Administered 2018-10-03: 30 mg via INTRAVENOUS

## 2018-10-03 MED ORDER — VANCOMYCIN HCL IN DEXTROSE 1-5 GM/200ML-% IV SOLN
INTRAVENOUS | Status: AC
  Filled 2018-10-03: qty 200

## 2018-10-03 MED ORDER — ACETAMINOPHEN 325 MG PO TABS
325.0000 mg | ORAL_TABLET | ORAL | Status: DC | PRN
  Administered 2018-10-07: 650 mg via ORAL
  Filled 2018-10-03: qty 2

## 2018-10-03 MED ORDER — ROCURONIUM BROMIDE 100 MG/10ML IV SOLN
INTRAVENOUS | Status: DC | PRN
  Administered 2018-10-03 (×2): 50 mg via INTRAVENOUS
  Administered 2018-10-03: 30 mg via INTRAVENOUS

## 2018-10-03 MED ORDER — MORPHINE SULFATE (PF) 2 MG/ML IV SOLN
1.0000 mg | INTRAVENOUS | Status: DC | PRN
  Administered 2018-10-03 – 2018-10-04 (×2): 2 mg via INTRAVENOUS
  Filled 2018-10-03 (×2): qty 1

## 2018-10-03 MED ORDER — SODIUM CHLORIDE 0.9 % IV SOLN
INTRAVENOUS | Status: AC
  Filled 2018-10-03: qty 1.2

## 2018-10-03 MED ORDER — OXYCODONE HCL 5 MG/5ML PO SOLN: 5.0000 mg | Freq: Once | ORAL | Status: DC | PRN

## 2018-10-03 MED ORDER — LIDOCAINE HCL (CARDIAC) PF 100 MG/5ML IV SOSY
PREFILLED_SYRINGE | INTRAVENOUS | Status: DC | PRN
  Administered 2018-10-03: 80 mg via INTRAVENOUS

## 2018-10-03 MED ORDER — SUGAMMADEX SODIUM 200 MG/2ML IV SOLN
INTRAVENOUS | Status: DC | PRN
  Administered 2018-10-03: 200 mg via INTRAVENOUS

## 2018-10-03 MED ORDER — ONDANSETRON HCL 4 MG/2ML IJ SOLN: 4.0000 mg | Freq: Once | INTRAMUSCULAR | Status: DC | PRN

## 2018-10-03 MED ORDER — ONDANSETRON HCL 4 MG/2ML IJ SOLN: 4.0000 mg | Freq: Four times a day (QID) | INTRAMUSCULAR | Status: DC | PRN

## 2018-10-03 MED ORDER — ENOXAPARIN SODIUM 40 MG/0.4ML ~~LOC~~ SOLN
40.0000 mg | SUBCUTANEOUS | Status: DC
  Administered 2018-10-04 – 2018-10-05 (×2): 40 mg via SUBCUTANEOUS
  Filled 2018-10-03 (×2): qty 0.4

## 2018-10-03 MED ORDER — GUAIFENESIN-DM 100-10 MG/5ML PO SYRP: 15.0000 mL | ORAL_SOLUTION | ORAL | Status: DC | PRN

## 2018-10-03 MED ORDER — HYDRALAZINE HCL 20 MG/ML IJ SOLN: 5.0000 mg | INTRAMUSCULAR | Status: DC | PRN

## 2018-10-03 MED ORDER — LACTATED RINGERS IV SOLN
INTRAVENOUS | Status: DC | PRN
  Administered 2018-10-03 (×2): via INTRAVENOUS

## 2018-10-03 MED ORDER — ASPIRIN EC 81 MG PO TBEC
81.0000 mg | DELAYED_RELEASE_TABLET | Freq: Every day | ORAL | Status: DC
  Administered 2018-10-04 – 2018-10-11 (×8): 81 mg via ORAL
  Filled 2018-10-03 (×8): qty 1

## 2018-10-03 SURGICAL SUPPLY — 71 items
ADH SKN CLS APL DERMABOND .7 (GAUZE/BANDAGES/DRESSINGS) ×2
BANDAGE ELASTIC 6 VELCRO ST LF (GAUZE/BANDAGES/DRESSINGS) ×1 IMPLANT
BNDG GAUZE ELAST 4 BULKY (GAUZE/BANDAGES/DRESSINGS) ×1 IMPLANT
CANISTER SUCT 3000ML PPV (MISCELLANEOUS) ×2 IMPLANT
CANNULA VESSEL 3MM 2 BLNT TIP (CANNULA) ×4 IMPLANT
CATH EMB 4FR 80CM (CATHETERS) ×1 IMPLANT
CATH EMB 5FR 80CM (CATHETERS) ×2 IMPLANT
CLIP LIGATING EXTRA MED SLVR (CLIP) ×2 IMPLANT
CLIP LIGATING EXTRA SM BLUE (MISCELLANEOUS) ×2 IMPLANT
COVER BACK TABLE 60X90IN (DRAPES) ×2 IMPLANT
COVER WAND RF STERILE (DRAPES) ×2 IMPLANT
DERMABOND ADVANCED (GAUZE/BANDAGES/DRESSINGS) ×2
DERMABOND ADVANCED .7 DNX12 (GAUZE/BANDAGES/DRESSINGS) ×2 IMPLANT
DRAPE BILATERAL SPLIT (DRAPES) IMPLANT
DRAPE CV SPLIT W-CLR ANES SCRN (DRAPES) IMPLANT
ELECT BLADE 4.0 EZ CLEAN MEGAD (MISCELLANEOUS) ×2
ELECT REM PT RETURN 9FT ADLT (ELECTROSURGICAL) ×2
ELECTRODE BLDE 4.0 EZ CLN MEGD (MISCELLANEOUS) IMPLANT
ELECTRODE REM PT RTRN 9FT ADLT (ELECTROSURGICAL) ×1 IMPLANT
FELT TEFLON 1X6 (MISCELLANEOUS) IMPLANT
GLOVE BIO SURGEON STRL SZ 6.5 (GLOVE) ×3 IMPLANT
GLOVE BIOGEL PI IND STRL 6.5 (GLOVE) IMPLANT
GLOVE BIOGEL PI IND STRL 7.0 (GLOVE) IMPLANT
GLOVE BIOGEL PI IND STRL 7.5 (GLOVE) IMPLANT
GLOVE BIOGEL PI INDICATOR 6.5 (GLOVE) ×3
GLOVE BIOGEL PI INDICATOR 7.0 (GLOVE) ×2
GLOVE BIOGEL PI INDICATOR 7.5 (GLOVE) ×1
GLOVE ECLIPSE 7.0 STRL STRAW (GLOVE) ×1 IMPLANT
GLOVE SS BIOGEL STRL SZ 7.5 (GLOVE) ×1 IMPLANT
GLOVE SUPERSENSE BIOGEL SZ 7.5 (GLOVE) ×1
GOWN STRL REUS W/ TWL LRG LVL3 (GOWN DISPOSABLE) ×3 IMPLANT
GOWN STRL REUS W/TWL LRG LVL3 (GOWN DISPOSABLE) ×6
GRAFT CV 30X8WVN NDL (Graft) IMPLANT
GRAFT HEMASHIELD 8MM (Graft) ×2 IMPLANT
INSERT FOGARTY 61MM (MISCELLANEOUS) ×1 IMPLANT
INSERT FOGARTY SM (MISCELLANEOUS) ×2 IMPLANT
KIT BASIN OR (CUSTOM PROCEDURE TRAY) ×2 IMPLANT
KIT TURNOVER KIT B (KITS) ×2 IMPLANT
LOOP VESSEL MINI RED (MISCELLANEOUS) ×2 IMPLANT
NS IRRIG 1000ML POUR BTL (IV SOLUTION) ×5 IMPLANT
PACK AORTA (CUSTOM PROCEDURE TRAY) ×2 IMPLANT
PAD ARMBOARD 7.5X6 YLW CONV (MISCELLANEOUS) ×4 IMPLANT
SPONGE LAP 18X18 X RAY DECT (DISPOSABLE) ×3 IMPLANT
SUT ETHIBOND 5 LR DA (SUTURE) IMPLANT
SUT MNCRL AB 4-0 PS2 18 (SUTURE) ×1 IMPLANT
SUT PDS AB 1 TP1 54 (SUTURE) ×2 IMPLANT
SUT PROLENE 3 0 SH1 36 (SUTURE) ×1 IMPLANT
SUT PROLENE 5 0 C 1 24 (SUTURE) ×10 IMPLANT
SUT PROLENE 5 0 C 1 36 (SUTURE) IMPLANT
SUT PROLENE 6 0 C 1 24 (SUTURE) ×1 IMPLANT
SUT PROLENE 6 0 CC (SUTURE) ×2 IMPLANT
SUT SILK 2 0 (SUTURE) ×2
SUT SILK 2 0 PERMA HAND 18 BK (SUTURE) ×1 IMPLANT
SUT SILK 2 0 SH CR/8 (SUTURE) ×2 IMPLANT
SUT SILK 2 0 TIES 17X18 (SUTURE) ×2
SUT SILK 2-0 18XBRD TIE 12 (SUTURE) ×1 IMPLANT
SUT SILK 2-0 18XBRD TIE BLK (SUTURE) ×1 IMPLANT
SUT SILK 3 0 (SUTURE) ×2
SUT SILK 3 0 TIES 17X18 (SUTURE) ×2
SUT SILK 3-0 18XBRD TIE 12 (SUTURE) ×1 IMPLANT
SUT SILK 3-0 18XBRD TIE BLK (SUTURE) ×1 IMPLANT
SUT VIC AB 2-0 CT1 27 (SUTURE) ×6
SUT VIC AB 2-0 CT1 36 (SUTURE) ×3 IMPLANT
SUT VIC AB 2-0 CT1 TAPERPNT 27 (SUTURE) IMPLANT
SUT VIC AB 3-0 SH 27 (SUTURE) ×10
SUT VIC AB 3-0 SH 27X BRD (SUTURE) ×4 IMPLANT
SYRINGE 3CC LL L/F (MISCELLANEOUS) ×1 IMPLANT
TOWEL GREEN STERILE (TOWEL DISPOSABLE) ×2 IMPLANT
TOWEL SURG RFD BLUE STRL DISP (DISPOSABLE) ×2 IMPLANT
TRAY FOLEY MTR SLVR 16FR STAT (SET/KITS/TRAYS/PACK) ×2 IMPLANT
WATER STERILE IRR 1000ML POUR (IV SOLUTION) ×4 IMPLANT

## 2018-10-03 NOTE — Op Note (Signed)
OPERATIVE REPORT  DATE OF SURGERY: 10/03/2018  PATIENT: Wendy Townsend, 68 y.o. female MRN: [id number]  DOB: Jul 21, 1951  PRE-OPERATIVE DIAGNOSIS: Critical limb ischemia left foot  POST-OPERATIVE DIAGNOSIS:  Same  PROCEDURE: Attempted thrombectomy left limb aortobifemoral bypass.  Right to left femorofemoral bypass and thrombectomy of left femoral to popliteal bypass  SURGEON:  Curt Jews, M.D.  PHYSICIAN ASSISTANT: Laurence Slate, PA-C  ANESTHESIA: General  EBL: per anesthesia record  Total I/O In: 2015 [I.V.:1200; Blood:315; IV Piggyback:500] Out: 200 [Urine:100; Blood:100]  BLOOD ADMINISTERED: none  DRAINS: none  SPECIMEN: none  COUNTS CORRECT:  YES  PATIENT DISPOSITION:  PACU - hemodynamically stable  PROCEDURE DETAILS: The patient presented to the emergency room with profound ischemia of his left foot.  Reports this is been present for several days and has been progressively worse.  She did have motor and sensory function in her foot.  She has a complex past history with remote past of left femoral to popliteal bypass which occluded.  She had presented 2 years ago with bilateral critical limb ischemia and underwent aortobifemoral bypass and left femoropopliteal by Dr. Donzetta Matters.  He had cold foot in the emergency department with severe rest pain.  CT angiogram revealed occlusion of her left limb of her aortofemoral graft and of her femoropopliteal bypass.  Was explained to patient that she was at very high risk for limb loss with treatment options becoming more limited as she became more episodes of occlusion.  She is taken the operating for emergent revascularization  The area both groins and left leg were prepped draped in usual sterile fashion.  Incision was made over the left femoral scar and carried down to isolate the left limb of the aortofemoral graft and the femoropopliteal graft.  Space the patient had extensive scarring in her groin.  The femoropopliteal Gore-Tex  graft was removed from the hood of the aortofemoral graft.  A 5 Fogarty passed centrally and there was obviously chronic occlusion of this limb of the graft with no new thrombus present.  I could get the Fogarty into the aorta but could not get good arterial flow through this.  There was no flow in the superficial femoral artery the native external iliac under the inguinal ligament.  The profunda branches were dissected further out.  The profunda was small but did appear to be patent.  Decision was made to proceed with right to left femorofemoral bypass to allow inflow to the left groin.  Separate incision was made over the right scar and carried down to isolate the right limb of the aortofemoral graft.  Again there was dense scarring in the groin.  The anastomosis was just under the inguinal ligament.  The native external iliac and limb of the graft were exposed as was the superficial femoral and profundus femoris arteries.  Should be mentioned the patient was fully heparinized prior to the initial thrombectomy.  Next incision was made over the medial aspect of the popliteal artery.  A Fogarty catheter could make it down to the anastomosis but not through the anastomosis via the groin.  The patient is a below-knee popliteal artery anastomosis was identified.  The patient had a very small popliteal artery and the old graft was sewn just proximal to the anterior tibial takeoff and the tibioperoneal trunk.  A tunnel was created between the level of the right groin and left groin and an 8 mm Hemashield graft brought through the tunnel.  The native external iliac and right limb  of the graft was occluded as was the superficial femoral and profundus on the right.  An ellipse of graft was removed just above the toe of the anastomosis.  The 8 mm graft was sewn end-to-side to the right limb of the aortofemoral graft with a running 5-0 Prolene suture.  This anastomosis was tested and found to be adequate.  The graft was  brought through a prior created tunnel to the left groin.  Due to the external iliac and superficial femoral artery being chronically occluded, the left limb of the graft was sewn into into the profundus femoris artery.  The profunda was opened longitudinally and the graft was spatulated and sewn end-to-end to the deep femoral artery.  This was done with a running 5-0 Prolene suture.  The usual flushing maneuvers were undertaken anastomosis completed and flow was restored to the profunda.  The hood of the distal Gore-Tex graft at the distal anastomosis was opened and there was actually backbleeding from this.  There was an organized thrombus at this area and this was removed and direct vision.  A 2-1/2 dilator passed in the proximal direction in the native popliteal artery and also passed into the tibioperoneal trunk with good backbleeding.  The incision in the Gore-Tex graft near the distal anastomosis was closed with a running 6-0 Prolene suture.  Graft was flushed with heparinized saline and reoccluded.  Finally the left limb of the right to left femorofemoral graft was reoccluded and the old Gore-Tex graft was cut to the appropriate length.  An ellipse of graft was removed and the old Gore-Tex graft was sewn into into the new left limb of the right to left femorofemoral graft clamps removed and audible flow was noted to the posterior tibial and anterior tibial at the ankle.  The patient was given protamine to reverse the heparin.  The wounds were irrigated with saline.  Hemostasis electrocautery.  The wounds were closed with 2-0 Vicryl in the deep tissues in several layers.  Skin was closed with 3-0 and 4-0 subcuticular Vicryl suture.  Patient was transferred to the recovery room in stable condition   Rosetta Posner, M.D., [facility] 10/03/2018 8:14 PM

## 2018-10-03 NOTE — ED Provider Notes (Signed)
Medical screening examination/treatment/procedure(s) were conducted as a shared visit with non-physician practitioner(s) and myself.  I personally evaluated the patient during the encounter.  None Patient with known history of peripheral vascular disease and prior bypass grafting of the left lower extremity presents with increased left foot pain.  Patient has increased erythema of the great toe and forefoot with soft blister formation over the great toe.  No palpable pulses.  Foot cool to touch.  Calf is soft and nontender.  I agree with plan of management.   Charlesetta Shanks, MD 10/03/18 [phone]

## 2018-10-03 NOTE — Plan of Care (Signed)
  Problem: Education: Goal: Knowledge of General Education information will improve Description: Including pain rating scale, medication(s)/side effects and non-pharmacologic comfort measures Outcome: Progressing   Problem: Clinical Measurements: Goal: Ability to maintain clinical measurements within normal limits will improve Outcome: Progressing   

## 2018-10-03 NOTE — Anesthesia Procedure Notes (Signed)
Procedure Name: Intubation Date/Time: 10/03/2018 2:52 PM Performed by: Teller Wakefield T, CRNA Pre-anesthesia Checklist: Patient identified, Emergency Drugs available, Suction available, Patient being monitored and Timeout performed Patient Re-evaluated:Patient Re-evaluated prior to induction Oxygen Delivery Method: Circle system utilized Preoxygenation: Pre-oxygenation with 100% oxygen Induction Type: IV induction Ventilation: Mask ventilation without difficulty Laryngoscope Size: Mac and 4 Grade View: Grade II Tube type: Oral Tube size: 7.0 mm Number of attempts: 1 Airway Equipment and Method: Stylet Placement Confirmation: ETT inserted through vocal cords under direct vision,  positive ETCO2,  CO2 detector and breath sounds checked- equal and bilateral Secured at: 22 cm Tube secured with: Tape Dental Injury: Teeth and Oropharynx as per pre-operative assessment

## 2018-10-03 NOTE — ED Triage Notes (Signed)
Pt here Via [facility] from home, Second toe of Lt foot is black/cold. EMS able to find posterior tibial pulse, not pedal pulse. Pt has Hx of circulation problems in that leg.

## 2018-10-03 NOTE — Anesthesia Procedure Notes (Signed)
Arterial Line Insertion Start/End1/15/2020 2:55 PM, 10/03/2018 3:10 PM Performed by: Lidia Collum, MD, anesthesiologist  Patient location: OR. Preanesthetic checklist: patient identified, IV checked, risks and benefits discussed, surgical consent, monitors and equipment checked, pre-op evaluation and timeout performed Patient sedated Right, radial was placed Catheter size: 20 G Hand hygiene performed  and maximum sterile barriers used   Attempts: 3 Procedure performed using ultrasound guided technique. Ultrasound Notes:anatomy identified, needle tip was noted to be adjacent to the nerve/plexus identified and no ultrasound evidence of intravascular and/or intraneural injection Following insertion, dressing applied and Biopatch. Post procedure assessment: normal  Patient tolerated the procedure well with no immediate complications.

## 2018-10-03 NOTE — Anesthesia Preprocedure Evaluation (Addendum)
Anesthesia Evaluation  Patient identified by MRN, date of birth, ID band Patient awake    Reviewed: Allergy & Precautions, NPO status , Patient's Chart, lab work & pertinent test results  History of Anesthesia Complications Negative for: history of anesthetic complications  Airway Mallampati: III  TM Distance: >3 FB Neck ROM: Full    Dental  (+) Poor Dentition, Loose, Missing,    Pulmonary neg pulmonary ROS, former smoker,    Pulmonary exam normal        Cardiovascular hypertension, + CAD and + Peripheral Vascular Disease  Normal cardiovascular exam     Neuro/Psych CVA negative psych ROS   GI/Hepatic negative GI ROS, Neg liver ROS,   Endo/Other  negative endocrine ROS  Renal/GU ARF and CRFRenal disease  negative genitourinary   Musculoskeletal negative musculoskeletal ROS (+)   Abdominal   Peds  Hematology negative hematology ROS (+)   Anesthesia Other Findings 68 yo F for emergent thrombectomy and distal bypass - PMH: CVA, HTN, PVD, AKI on CKD - TTE 07/10/16: EF 65-70%, grade 1 DD - LHC 07/11/16: mild-mod nonobstructive CAD  Reproductive/Obstetrics                           Anesthesia Physical Anesthesia Plan  ASA: III and emergent  Anesthesia Plan: General   Post-op Pain Management:    Induction: Intravenous  PONV Risk Score and Plan: 3 and Ondansetron, Dexamethasone, Midazolam and Treatment may vary due to age or medical condition  Airway Management Planned: Oral ETT  Additional Equipment: Arterial line  Intra-op Plan:   Post-operative Plan: Extubation in OR  Informed Consent: I have reviewed the patients History and Physical, chart, labs and discussed the procedure including the risks, benefits and alternatives for the proposed anesthesia with the patient or authorized representative who has indicated his/her understanding and acceptance.     Dental advisory  given  Plan Discussed with:   Anesthesia Plan Comments:        Anesthesia Quick Evaluation

## 2018-10-03 NOTE — ED Provider Notes (Signed)
[facility] EMERGENCY DEPARTMENT Provider Note   CSN: [id number] Arrival date & time: 10/03/18  5809     History   Chief Complaint No chief complaint on file.   HPI Narissa Beaufort is a 68 y.o. female.  The history is provided by the patient and medical records. No language interpreter was used.  Toe Pain      68 year old female with history of peripheral vascular disease, history of stroke, claudication, prior left aortobifem bypass graft presenting complaining of left foot pain.  Patient report for the past 3 days she has had persistent pain to her left foot.  Described as a throbbing achy sensation, 8 out of 10, worsening with walking, and improves when she dangle her foot aside from the bed.  She tries to sleep the pain off without improvement.  She does not complain of any fever chills no chest pain or shortness of breath.  She denies any recent injury, denies any back pain.  No complaints to her right foot or leg.  Past Medical History:  Diagnosis Date  . Clotting disorder ([place])   . Hyperlipidemia   . Hypertension   . Peripheral vascular disease ([place])    vein surgery, left leg  . Stroke [facility]) 2002   ongoing mild loss of feeling in left side, had to learn to write right-handed  . Substance abuse ([place])   . Tobacco abuse     Patient Active Problem List   Diagnosis Date Noted  . History of tobacco abuse 08/10/2016  . Debilitated 07/20/2016  . Anemia of chronic disease   . Prediabetes   . Tobacco abuse   . Elevated bilirubin   . History of CVA (cerebrovascular accident)   . Benign essential HTN   . Diastolic dysfunction   . Coronary artery disease involving native coronary artery of native heart without angina pectoris   . Post-operative pain   . Acute blood loss anemia   . Diarrhea   . Hypophosphatemia   . Hypomagnesemia   . Hypoalbuminemia due to protein-calorie malnutrition ([place])   . Lymphocytosis   . Encounter for rehabilitation  07/18/2016  . Status post aortobifemoral bypass surgery   . Acute respiratory failure with hypoxia ([place])   . Acute renal failure superimposed on stage 3 chronic kidney disease ([place]) 07/13/2016  . Essential hypertension 07/13/2016  . Impaired glucose tolerance 07/13/2016  . Ischemic pain of foot, left   . Abnormal stress test   . Dyspnea   . Dry gangrene ([place]) 07/06/2016  . PVD (peripheral vascular disease) ([place]) 07/06/2016  . Hyperglycemia 07/06/2016  . Acute kidney injury superimposed on chronic kidney disease ([place]) 07/06/2016  . Protein-calorie malnutrition, severe 07/06/2016  . Ischemic pain of left foot 07/05/2016  . History of stroke 06/08/2016  . Left hemiparesis ([place]) 06/08/2016  . Numbness and tingling of left leg 06/08/2016  . Tobacco use disorder 06/08/2016  . Hypertension 06/28/2011  . Hyperlipidemia 06/28/2011  . PAD (peripheral artery disease) ([place]) 06/28/2011    Past Surgical History:  Procedure Laterality Date  . AORTA - BILATERAL FEMORAL ARTERY BYPASS GRAFT N/A 07/14/2016   Procedure: AORTOBIFEMORAL BYPASS GRAFT;  Surgeon: Waynetta Sandy, MD;  Location: [facility];  Service: Vascular;  Laterality: N/A;  . CARDIAC CATHETERIZATION N/A 07/11/2016   Procedure: Left Heart Cath and Coronary Angiography;  Surgeon: Nelva Bush, MD;  Location: [facility] CV LAB;  Service: Cardiovascular;  Laterality: N/A;  . FEMORAL-POPLITEAL BYPASS GRAFT Left 07/14/2016   Procedure: REDO BYPASS  GRAFT FEMORAL-POPLITEAL ARTERY;  Surgeon: Waynetta Sandy, MD;  Location: [facility];  Service: Vascular;  Laterality: Left;  . PERIPHERAL VASCULAR CATHETERIZATION N/A 07/08/2016   Procedure: Abdominal Aortogram w/ Bilateral Lower Extremity Runoff;  Surgeon: Elam Dutch, MD;  Location: [facility] CV LAB;  Service: Cardiovascular;  Laterality: N/A;  . VEIN SURGERY Left      OB History   No obstetric history on file.      Home Medications    Prior to Admission medications    Medication Sig Start Date End Date Taking? Authorizing Provider  aspirin 81 MG EC tablet Take 1 tablet (81 mg total) by mouth daily. Swallow whole. 03/02/18   Gildardo Cranker, DO  atorvastatin (LIPITOR) 80 MG tablet Take 1 tablet (80 mg total) by mouth daily. 01/11/18   Nahser, Wonda Cheng, MD  carvedilol (COREG) 3.125 MG tablet TAKE 1 TABLET (3.125 MG TOTAL) BY MOUTH 2 (TWO) TIMES DAILY WITH A MEAL. 01/12/18   Nahser, Wonda Cheng, MD  cloNIDine (CATAPRES) 0.2 MG tablet Take 1 tablet (0.2 mg total) by mouth 2 (two) times daily. 01/11/18   Nahser, Wonda Cheng, MD  hydrochlorothiazide (HYDRODIURIL) 25 MG tablet TAKE 1 TABLET BY MOUTH EVERY DAY 01/15/18   Nahser, Wonda Cheng, MD  lisinopril (PRINIVIL,ZESTRIL) 40 MG tablet Take 1 tablet (40 mg total) by mouth daily. 01/11/18   Nahser, Wonda Cheng, MD  PEG-KCl-NaCl-NaSulf-Na Asc-C (PLENVU) 140 g SOLR Take 1 kit by mouth as directed. 11/22/17   Irene Shipper, MD    Family History Family History  Problem Relation Age of Onset  . Hypertension Mother 82  . Cancer Brother   . Cancer Sister   . Colon cancer Neg Hx   . Colon polyps Neg Hx   . Esophageal cancer Neg Hx   . Rectal cancer Neg Hx   . Stomach cancer Neg Hx     Social History Social History   Tobacco Use  . Smoking status: Former Smoker    Years: 25.00    Types: Cigarettes    Last attempt to quit: 05/20/2016    Years since quitting: 2.3  . Smokeless tobacco: Never Used  . Tobacco comment: Has not smoked since being discharged from hospital   Substance Use Topics  . Alcohol use: Yes    Comment: 2-3 times a year  . Drug use: Yes    Types: Marijuana    Comment: when she can't sleep sometimes at night     Allergies   Penicillins   Review of Systems Review of Systems  All other systems reviewed and are negative.    Physical Exam Updated Vital Signs BP (!) 154/92 (BP Location: Right Arm)   Pulse 91   Temp 98.5 F (36.9 C) (Oral)   Resp 16   SpO2 100%   Physical Exam Vitals signs and  nursing note reviewed.  Constitutional:      General: She is not in acute distress.    Appearance: She is well-developed.  HENT:     Head: Atraumatic.  Eyes:     Conjunctiva/sclera: Conjunctivae normal.  Neck:     Musculoskeletal: Neck supple.  Cardiovascular:     Rate and Rhythm: Normal rate and regular rhythm.  Pulmonary:     Effort: Pulmonary effort is normal.     Breath sounds: Normal breath sounds.  Abdominal:     Palpations: Abdomen is soft.     Tenderness: There is no abdominal tenderness.  Musculoskeletal:  General: Tenderness (Left foot: Entire foot is erythematous, with gangrene involving all toes, unable to take pedal pulse.) present.  Skin:    Capillary Refill: Left foot: Diminished cap refill to all toes with appearance of wet gangrene.  Left second toe is ischemic.    Findings: No rash.  Neurological:     Mental Status: She is alert.      ED Treatments / Results  Labs (all labs ordered are listed, but only abnormal results are displayed) Labs Reviewed  BASIC METABOLIC PANEL - Abnormal; Notable for the following components:      Result Value   CO2 21 (*)    Glucose, Bld 124 (*)    BUN 29 (*)    Creatinine, Ser 1.66 (*)    GFR calc non Af Amer 32 (*)    GFR calc Af Amer 37 (*)    All other components within normal limits  CBC WITH DIFFERENTIAL/PLATELET    EKG None  Radiology Ct Angio Ao+bifem W & Or Wo Contrast  Result Date: 10/03/2018 CLINICAL DATA:  Second toe of the left foot is black and cold. Inability to find pedal pulses. EXAM: CT ANGIOGRAPHY OF ABDOMINAL AORTA WITH ILIOFEMORAL RUNOFF TECHNIQUE: Multidetector CT imaging of the abdomen, pelvis and lower extremities was performed using the standard protocol during bolus administration of intravenous contrast. Multiplanar CT image reconstructions and MIPs were obtained to evaluate the vascular anatomy. CONTRAST:  72m ISOVUE-370 IOPAMIDOL (ISOVUE-370) INJECTION 76% COMPARISON:  None. FINDINGS:  VASCULAR Aorta: Post aortobifemoral bypass grafting however there is still persistent opacification of the infra anastomotic native abdominal aorta which supplies the IMA, right common and external iliac arteries. While the proximal anastomosis of the bypass graft is widely patent, there is a minimal to moderate amount of slightly irregular eccentric mural thrombus involving the proximal aspect of the bypass graft, not resulting in hemodynamically significant stenosis. Unfortunately, there is complete occlusion of the left limb of the bypass graft. The right limb of the bypass graft appears patent through the distal anastomosis. Celiac: There is a minimal amount of noncalcified atherosclerotic plaque and tortuosity involving the origin proximal aspect of the celiac artery, not resulting in hemodynamically significant stenosis. SMA: There is a minimal amount of eccentric mixed calcified and noncalcified atherosclerotic plaque involving the distal aspect of the main trunk of the SMA (representative sagittal image 71, series 10), not resulting in hemodynamically significant stenosis. Conventional branching pattern. The distal tributaries of the SMA appear widely patent without discrete intraluminal filling defect to suggest distal embolism. Renals: Solitary bilaterally the bilateral renal arteries are widely patent without hemodynamically significant stenosis. No vessel irregularity to suggest FMD. IMA: The IMA remains patent from the patent infra anastomotic abdominal aorta. RIGHT Lower Extremity Inflow: As above, post aortobifemoral bypass grafting. The right limb of the bypass graft appears widely patent without significant intragraft thrombus. Outflow: There is focal eccentric noncalcified plaque involving the origin of the right deep femoral and superficial femoral arteries resulting in short segment severe (at least 75%) luminal narrowing (image 132, series 6). There is eccentric slightly irregular mixed  calcified and noncalcified atherosclerotic plaque throughout the remainder of the right superficial femoral artery resulting in tandem areas of at least 50% luminal narrowing (representative images 171, 184, 196, 207 and 221, series 6). There is a minimal amount of eccentric mixed calcified and noncalcified atherosclerotic plaque involving the right above and below-knee popliteal arteries, not definitely resulting in hemodynamically significant stenosis. Runoff: 2 vessel runoff to the right lower  leg and foot as the right posterior tibial artery occludes at the level of the mid calf (image 346, series 6). The right dorsalis pedis artery is patent to the level of the forefoot. LEFT Lower Extremity Inflow: As above, the left limb of the aorta bypass graft is occluded. Outflow: Both tandem femoral popliteal bypass grafts are occluded throughout their imaged courses. There is faint atretic reconstitution of the left deep femoral artery via pelvic arterial collaterals. Runoff: There is atretic reconstitution of the left tibioperoneal trunk via deep thigh collaterals (image 283, series 6). Single-vessel runoff to the left lower leg and foot via the peroneal artery as both the left posterior (image 326, series 6) and anterior tibial (image 343, series 6) arteries occluded at the level of the mid calf. The left peroneal artery supplies the dorsalis pedis artery which appears patent to the level of the midfoot. No discrete internal filling defect suggest distal embolism. Veins: The IVC and pelvic venous system appear patent on this arterial phase examination. Review of the MIP images confirms the above findings. _________________________________________________________ NON-VASCULAR Lower chest: Limited visualization of lower thorax demonstrates minimal dependent subpleural ground-glass atelectasis. No discrete focal airspace opacities. No pleural effusion. Normal heart size. Coronary artery calcifications. Small amount of  pericardial fluid, presumably physiologic. Hepatobiliary: Normal hepatic contour. No discrete hyperenhancing hepatic lesions. Normal appearance of the gallbladder given degree distention. No radiopaque gallstones. No intra extrahepatic bili duct dilatation. No ascites. Pancreas: Normal appearance of the pancreas. Spleen: Normal appearance of the spleen. Adrenals/Urinary Tract: There is symmetric enhancement of the bilateral kidneys. Note is made of 2 hypoattenuating nonenhancing right-sided renal cysts the largest of which within the superior pole the right kidney measures 1.5 cm in diameter (image 42, series 8). Additional subcentimeter bilateral hypoattenuating renal lesions are too small to adequately characterize though favored to represent additional renal cysts. No urine obstruction or perinephric stranding. There is mild thickening of the bilateral adrenal glands without discrete nodule. Normal appearance of the urinary bladder given degree distention. Stomach/Bowel: Colonic diverticulosis without evidence of superimposed acute diverticulitis. Normal appearance of the terminal ileum and appendix. No pneumoperitoneum, pneumatosis or portal venous gas. No made of a small hiatal hernia. Lymphatic: No bulky retroperitoneal, mesenteric, pelvic or inguinal lymphadenopathy. Reproductive: Myomatous uterus with dominant fibroid arising from the left side of the uterine body measuring approximately 5.4 x 5.0 cm (image 120, series 6). No definitive adnexal lesions. No free fluid the pelvic cul-de-sac. Other: Regional soft tissues appear normal. Musculoskeletal: No definite acute or aggressive osseous abnormalities. IMPRESSION: VASCULAR 1. Post aorto bi femoral bypass graft with complete occlusion of the left limb of the graft. 2. There is persistent opacification of the infrarenal native abdominal aorta which supplies the IMA, right common and internal iliac arteries. 3.  Aortic Atherosclerosis (ICD10-I70.0). Right  lower extremity vascular Impression: 1. Short segment severe (at least 75%) luminal narrowing involving the proximal aspects of the right deep and superficial femoral arteries. 2. Tandem areas of at least 50% luminal narrowing throughout the right superficial femoral artery. 3. The right above and below-knee popliteal arteries are patent and of normal caliber without a hemodynamically significant stenosis. 4. Two vessel runoff to the right lower leg and foot via the anterior tibial and peroneal arteries. The right-sided dorsalis pedis artery is patent to the level of the forefoot. Left lower extremity vascular Impression: 1. The left limb of the aortobifemoral bypass graft is occluded. 2. Complete occlusion of both tandem femoropopliteal bypass grafts. 3. Atretic reconstitution  of the tibioperoneal trunk via deep thigh collaterals. 4. Single-vessel runoff to the left lower leg and foot via the left peroneal artery. The left peroneal artery reconstitutes the dorsalis pedis artery which appears patent to the level of the midfoot. No discrete intraluminal filling defect to suggest distal embolism. NON-VASCULAR 1. Colonic diverticulosis without evidence of diverticulitis. 2. Myomatous uterus. Critical Value/emergent results were called by telephone at the time of interpretation on 10/03/2018 at 1:20 pm to Dr. Charlesetta Shanks , who verbally acknowledged these results. Electronically Signed   By: Sandi Mariscal M.D.   On: 10/03/2018 13:23    Procedures .Critical Care Performed by: Domenic Moras, PA-C Authorized by: Domenic Moras, PA-C   Critical care provider statement:    Critical care time (minutes):  45   Critical care was time spent personally by me on the following activities:  Discussions with consultants, evaluation of patient's response to treatment, examination of patient, ordering and performing treatments and interventions, ordering and review of laboratory studies, ordering and review of radiographic studies,  pulse oximetry, re-evaluation of patient's condition, obtaining history from patient or surrogate and review of old charts   (including critical care time)  Medications Ordered in ED Medications  HYDROmorphone (DILAUDID) injection 1 mg (1 mg Intravenous Given 10/03/18 1028)  iopamidol (ISOVUE-370) 76 % injection 100 mL (80 mLs Intravenous Contrast Given 10/03/18 1229)     Initial Impression / Assessment and Plan / ED Course  I have reviewed the triage vital signs and the nursing notes.  Pertinent labs & imaging results that were available during my care of the patient were reviewed by me and considered in my medical decision making (see chart for details).    BP (!) 104/59   Pulse 67   Temp 98.1 F (36.7 C) (Oral)   Resp 12   Ht 5' 4"  (1.626 m)   Wt 61.2 kg   SpO2 100%   BMI 23.17 kg/m    Final Clinical Impressions(s) / ED Diagnoses   Final diagnoses:  Gangrene of toe of left foot (HCC)  Nontraumatic ischemic infarction of muscle of left lower leg    ED Discharge Orders    None     9:32 AM Patient with prior history of occluded left CFA to below-knee popliteal artery requiring bypass grafting in 2017 presenting with claudication of her left foot for the past 3 days.  We were unable to detect pedal pulses.  Foot with appearance of wet gangrene involving all of her toes.  9:40 AM Prompt consultation to vascular surgeon, Dr. Marrion Coy who is currently in the OR, I spoke with his nurse.  He will call me back.  In the meantime, basic labs ordered, patient made n.p.o., pain medication given.  1:30 PM CT angiogram with runoff demonstrate post aortobifemoral bypass graft with complete occlusion of the left limb of the graft.  The left limb of the aortobifemoral bypass graft is occluded.  This results were notified to vascular surgeon, Dr. Donnetta Hutching, he was ready to accept patient to the OR for surgical intervention.  Patient is aware of plan.   Domenic Moras, PA-C 10/03/18 1333      Charlesetta Shanks, MD 10/04/18 [phone]

## 2018-10-03 NOTE — Transfer of Care (Signed)
Immediate Anesthesia Transfer of Care Note  Patient: Wendy Townsend  Procedure(s) Performed: THROMBECTOMY OF LEFT LEG, LEFT FEMORAL-POPLITEAL BYPASS GRAFT, RIGHT TO LEFT FEMORAL-FEMORAL BYPASS GRAFT (Left Leg Upper)  Patient Location: PACU  Anesthesia Type:General  Level of Consciousness: awake, alert  and patient cooperative  Airway & Oxygen Therapy: Patient Spontanous Breathing and Patient connected to nasal cannula oxygen  Post-op Assessment: Report given to RN, Post -op Vital signs reviewed and stable and Patient moving all extremities  Post vital signs: Reviewed and stable  Last Vitals:  Vitals Value Taken Time  BP 153/82 10/03/2018  8:08 PM  Temp    Pulse    Resp 19 10/03/2018  8:09 PM  SpO2    Vitals shown include unvalidated device data.  Last Pain:  Vitals:   10/03/18 0932  TempSrc: Oral  PainSc:          Complications: No apparent anesthesia complications

## 2018-10-03 NOTE — H&P (Signed)
[facility]  Patient name: Wendy Townsend MRN: [id number] DOB: 1951/09/03 Sex: female    HPI: Wendy Townsend is a 68 y.o. female patient presents to the emergency room with critical limb ischemia.  Extensive past vascular surgical history.  She had a left femoral to below-knee popliteal bypass with prosthetic graft sometime in the distant past.  She presented 2 and half years ago with profound ischemia bilaterally.  Work-up at that time revealed occlusion of iliac vessels bilaterally and occlusion of her old left femoral to popliteal bypass.  She underwent a aortobifemoral bypass and redo left femoral to below-knee popliteal bypass with Gore-Tex graft.  She presents now with profound ischemia of her left foot.  This is been present for approximately 4 days.  She is not able to stand and put weight on her foot.  He is sitting in the stretcher with her left foot dependent and reports severe pain with this elevated.  Past Medical History:  Diagnosis Date  . Clotting disorder ([place])   . Hyperlipidemia   . Hypertension   . Peripheral vascular disease ([place])    vein surgery, left leg  . Stroke [facility]) 2002   ongoing mild loss of feeling in left side, had to learn to write right-handed  . Substance abuse ([place])   . Tobacco abuse     Family History  Problem Relation Age of Onset  . Hypertension Mother 62  . Cancer Brother   . Cancer Sister   . Colon cancer Neg Hx   . Colon polyps Neg Hx   . Esophageal cancer Neg Hx   . Rectal cancer Neg Hx   . Stomach cancer Neg Hx     SOCIAL HISTORY: Social History   Tobacco Use  . Smoking status: Former Smoker    Years: 25.00    Types: Cigarettes    Last attempt to quit: 05/20/2016    Years since quitting: 2.3  . Smokeless tobacco: Never Used  . Tobacco comment: Has not smoked since being discharged from hospital   Substance Use Topics  . Alcohol use: Yes    Comment: 2-3 times a  year    Allergies  Allergen Reactions  . Penicillins Swelling    Has patient had a PCN reaction causing immediate rash, facial/tongue/throat swelling, SOB or lightheadedness with hypotension: YES Has patient had a PCN reaction causing severe rash involving mucus membranes or skin necrosis: NO Has patient had a PCN reaction that required hospitalization NO Has patient had a PCN reaction occurring within the last 10 years: NO If all of the above answers are "NO", then may proceed with Cephalosporin use.    No current facility-administered medications for this encounter.    Current Outpatient Medications  Medication Sig Dispense Refill  . aspirin 81 MG EC tablet Take 1 tablet (81 mg total) by mouth daily. Swallow whole. 30 tablet 11  . atorvastatin (LIPITOR) 80 MG tablet Take 1 tablet (80 mg total) by mouth daily. 90 tablet 3  . carvedilol (COREG) 3.125 MG tablet TAKE 1 TABLET (3.125 MG TOTAL) BY MOUTH 2 (TWO) TIMES DAILY WITH A MEAL. 60 tablet 11  . cloNIDine (CATAPRES) 0.2 MG tablet Take 1 tablet (0.2 mg total) by mouth 2 (two) times daily. 60 tablet 11  . hydrochlorothiazide (HYDRODIURIL) 25 MG tablet TAKE 1 TABLET BY MOUTH EVERY DAY (Patient taking differently: Take 25 mg by mouth every other day. ) 30 tablet 11  . lisinopril (PRINIVIL,ZESTRIL) 40 MG tablet  Take 1 tablet (40 mg total) by mouth daily. 90 tablet 3  . PEG-KCl-NaCl-NaSulf-Na Asc-C (PLENVU) 140 g SOLR Take 1 kit by mouth as directed. (Patient not taking: Reported on 10/03/2018) 1 each 0    REVIEW OF SYSTEMS:  [X]  denotes positive finding, [ ]  denotes negative finding Cardiac  Comments:  Chest pain or chest pressure:    Shortness of breath upon exertion:    Short of breath when lying flat:    Irregular heart rhythm:        Vascular    Pain in calf, thigh, or hip brought on by ambulation: x   Pain in feet at night that wakes you up from your sleep:  x   Blood clot in your veins:    Leg swelling:           PHYSICAL  EXAM: Vitals:   10/03/18 1100 10/03/18 1115 10/03/18 1130 10/03/18 1145  BP: 99/61 (!) 104/59 (!) 109/49 (!) 95/59  Pulse: 67  64 69  Resp: 11 12 10 12   Temp:      TempSrc:      SpO2: 100%  100% 100%  Weight:      Height:        GENERAL: The patient is a well-nourished female, in no acute distress. The vital signs are documented above. CARDIOVASCULAR: Palpable right femoral graft pulse and 1-2+ right dorsalis pedis pulse.  Absent left femoral pulse and absent pedal pulses on the left.  Foot is very cold. PULMONARY: There is good air exchange  MUSCULOSKELETAL: There are no major deformities or cyanosis. NEUROLOGIC: Markedly diminished motor and sensory function in her foot although she does report she can feel her toes to some degree SKIN: There are no ulcers or rashes noted. PSYCHIATRIC: The patient has a normal affect.  DATA:  CT shows occlusion of left limb of aortobifemoral bypass and occlusion of femoropopliteal bypass.  Very diseased tibioperoneal trunk reconstituted with peroneal runoff  MEDICAL ISSUES: Critical limb ischemia left foot.  Take immediately to the operating room for revascularization.  Risk for limb loss due to her third time redo femoropopliteal with very limited runoff and limited prosthetic.  Will attempt thrombectomy of her left limb of her aortofemoral graft.  May require femorofemoral bypass for flow into her left groin.  May also require fasciotomy.    Rosetta Posner, MD FACS [facility] Tel [phone] Pager [phone]

## 2018-10-03 NOTE — Anesthesia Postprocedure Evaluation (Signed)
Anesthesia Post Note  Patient: Wendy Townsend  Procedure(s) Performed: THROMBECTOMY OF LEFT LEG, LEFT FEMORAL-POPLITEAL BYPASS GRAFT, RIGHT TO LEFT FEMORAL-FEMORAL BYPASS GRAFT (Left Leg Upper)     Patient location during evaluation: PACU Anesthesia Type: General Level of consciousness: awake and alert Pain management: pain level controlled Vital Signs Assessment: post-procedure vital signs reviewed and stable Respiratory status: spontaneous breathing, nonlabored ventilation, respiratory function stable and patient connected to nasal cannula oxygen Cardiovascular status: blood pressure returned to baseline and stable Postop Assessment: no apparent nausea or vomiting Anesthetic complications: no    Last Vitals:  Vitals:   10/03/18 2100 10/03/18 2131  BP: 134/74 (!) 142/79  Pulse:  80  Resp: 11 12  Temp:  36.7 C  SpO2: 100% 100%    Last Pain:  Vitals:   10/03/18 2131  TempSrc: Oral  PainSc:                  Audry Pili

## 2018-10-03 NOTE — Progress Notes (Signed)
PHARMACY NOTE:  ANTIMICROBIAL RENAL DOSAGE ADJUSTMENT  Current antimicrobial regimen includes a mismatch between antimicrobial dosage and estimated renal function.  As per policy approved by the Pharmacy & Therapeutics and Medical Executive Committees, the antimicrobial dosage will be adjusted accordingly.  Current antimicrobial dosage:  Vancomycin 1g IV q12 x2 Indication: Prophylaxis  Renal Function:  Estimated Creatinine Clearance: 28.8 mL/min (A) (by C-G formula based on SCr of 1.66 mg/dL (H)). []      On intermittent HD, scheduled: []      On CRRT    Antimicrobial dosage has been changed to:  Vancomycin 1g x1 post op  Additional comments: One post-op dose will provide adequate coverage for patient >24 hours given their current renal function and weight.    Thank you for allowing pharmacy to be a part of this patient's care.  Erin Hearing PharmD., BCPS Clinical Pharmacist 10/03/2018 9:53 PM

## 2018-10-04 ENCOUNTER — Inpatient Hospital Stay (HOSPITAL_COMMUNITY): Payer: Medicare HMO

## 2018-10-04 ENCOUNTER — Encounter (HOSPITAL_COMMUNITY): Payer: Self-pay | Admitting: Vascular Surgery

## 2018-10-04 DIAGNOSIS — I739 Peripheral vascular disease, unspecified: Secondary | ICD-10-CM

## 2018-10-04 LAB — BPAM RBC
Blood Product Expiration Date: 202002082359
ISSUE DATE / TIME: 202001151934
Unit Type and Rh: 7300

## 2018-10-04 LAB — BASIC METABOLIC PANEL WITH GFR
Anion gap: 12 (ref 5–15)
BUN: 21 mg/dL (ref 8–23)
CO2: 21 mmol/L — ABNORMAL LOW (ref 22–32)
Calcium: 8.5 mg/dL — ABNORMAL LOW (ref 8.9–10.3)
Chloride: 105 mmol/L (ref 98–111)
Creatinine, Ser: 1.46 mg/dL — ABNORMAL HIGH (ref 0.44–1.00)
GFR calc Af Amer: 43 mL/min — ABNORMAL LOW
GFR calc non Af Amer: 37 mL/min — ABNORMAL LOW
Glucose, Bld: 137 mg/dL — ABNORMAL HIGH (ref 70–99)
Potassium: 3.7 mmol/L (ref 3.5–5.1)
Sodium: 138 mmol/L (ref 135–145)

## 2018-10-04 LAB — TYPE AND SCREEN
ABO/RH(D): B POS
ANTIBODY SCREEN: NEGATIVE
Unit division: 0

## 2018-10-04 LAB — CBC
HCT: 27.2 % — ABNORMAL LOW (ref 36.0–46.0)
Hemoglobin: 9.1 g/dL — ABNORMAL LOW (ref 12.0–15.0)
MCH: 29.7 pg (ref 26.0–34.0)
MCHC: 33.5 g/dL (ref 30.0–36.0)
MCV: 88.9 fL (ref 80.0–100.0)
Platelets: 149 10*3/uL — ABNORMAL LOW (ref 150–400)
RBC: 3.06 MIL/uL — ABNORMAL LOW (ref 3.87–5.11)
RDW: 14.8 % (ref 11.5–15.5)
WBC: 13.1 10*3/uL — ABNORMAL HIGH (ref 4.0–10.5)
nRBC: 0 % (ref 0.0–0.2)

## 2018-10-04 MED ORDER — HYDROMORPHONE HCL 1 MG/ML IJ SOLN
1.0000 mg | INTRAMUSCULAR | Status: DC | PRN
  Administered 2018-10-04: 1 mg via INTRAVENOUS
  Filled 2018-10-04: qty 1

## 2018-10-04 NOTE — Progress Notes (Signed)
Orthopedic Tech Progress Note Patient Details:  Wendy Townsend Aug 19, 1951 [id number]  Ortho Devices Type of Ortho Device: Knee Immobilizer Ortho Device/Splint Location: LLE Ortho Device/Splint Interventions: Ordered, Adjustment, Application   Post Interventions Patient Tolerated: Well Instructions Provided: Care of device, Adjustment of device   [name] J Renetta Suman 10/04/2018, 2:03 PM

## 2018-10-04 NOTE — Progress Notes (Signed)
ABI  has been completed.   Preliminary results in CV Proc.   Abram Sander 10/04/2018 11:18 AM

## 2018-10-04 NOTE — Progress Notes (Signed)
AD Alma Friendly went to assist patient with positioning and patient wanted immobilizer removed and did not want to wear it. Pt educated multiple times to straighten leg. Immobilizer ordered to help but pt feels this hurts more and wants to keep knee bent. Will continue to educate as allowed. Pt resting with call bell within reach.  Will continue to monitor.

## 2018-10-04 NOTE — Progress Notes (Addendum)
  Progress Note    10/04/2018 9:01 AM 1 Day Post-Op  Subjective:  Somewhat confused and somnolent this morning   Vitals:   10/04/18 0448 10/04/18 0730  BP: (!) 93/57 105/62  Pulse: 100 96  Resp: 15 18  Temp: 99.1 F (37.3 C) 99.7 F (37.6 C)  SpO2: 98% 96%   Physical Exam: Lungs:  Non labored Incisions:  B groin incisions c/d/i; L popliteal incision also c/d/i Extremities:  L calf soft; L PT and DP brisk by doppler; L foot hyperemic and painful to touch; R DP brisk by doppler Abdomen:  Soft Neurologic: alert but a little confused and somnolent  CBC    Component Value Date/Time   WBC 13.1 (H) 10/04/2018 0424   RBC 3.06 (L) 10/04/2018 0424   HGB 9.1 (L) 10/04/2018 0424   HCT 27.2 (L) 10/04/2018 0424   PLT 149 (L) 10/04/2018 0424   MCV 88.9 10/04/2018 0424   MCH 29.7 10/04/2018 0424   MCHC 33.5 10/04/2018 0424   RDW 14.8 10/04/2018 0424   LYMPHSABS 1.7 10/03/2018 0917   MONOABS 0.8 10/03/2018 0917   EOSABS 0.1 10/03/2018 0917   BASOSABS 0.0 10/03/2018 0917    BMET    Component Value Date/Time   NA 138 10/04/2018 0424   K 3.7 10/04/2018 0424   CL 105 10/04/2018 0424   CO2 21 (L) 10/04/2018 0424   GLUCOSE 137 (H) 10/04/2018 0424   BUN 21 10/04/2018 0424   CREATININE 1.46 (H) 10/04/2018 0424   CREATININE 1.25 (H) 03/02/2018 1043   CALCIUM 8.5 (L) 10/04/2018 0424   GFRNONAA 37 (L) 10/04/2018 0424   GFRNONAA 45 (L) 03/02/2018 1043   GFRAA 43 (L) 10/04/2018 0424   GFRAA 52 (L) 03/02/2018 1043    INR    Component Value Date/Time   INR 1.43 07/14/2016 2038     Intake/Output Summary (Last 24 hours) at 10/04/2018 0901 Last data filed at 10/04/2018 0400 Gross per 24 hour  Intake 4015 ml  Output 1550 ml  Net 2465 ml     Assessment/Plan:  68 y.o. female is s/p R to L fem fem bypass and thrombectomy of L fem-pop 1 Day Post-Op    Perfusing feet well with strong doppler signals bilaterally Encouraged patient to keep her LLE straight in bed Morning draw  labs pending Home when pain controlled and mobility increased   Dagoberto Ligas, PA-C [facility] [phone] 10/04/2018 9:01 AM  I have examined the patient, reviewed and agree with above.  Well-perfused left foot.  Had profound ischemia for prolonged period of time.  Foot rather hyperemic.  Will need mobilization and physical therapy  Curt Jews, MD 10/04/2018 11:26 AM

## 2018-10-04 NOTE — Progress Notes (Signed)
OT Cancellation Note  Patient Details Name: Wendy Townsend MRN: [id number] DOB: 11/06/1950   Cancelled Treatment:    Reason Eval/Treat Not Completed: Patient at procedure or test/ unavailable. Will check back later as able/as appropriate  Britt Bottom 10/04/2018, 10:53 AM

## 2018-10-04 NOTE — Evaluation (Addendum)
Physical Therapy Evaluation Patient Details Name: Wendy Townsend MRN: [id number] DOB: October 26, 1950 Today's Date: 10/04/2018   History of Present Illness  Pt is a 68 y.o. F who is s/p R to L fem fem bypass and thrombectomy of L fem-pop. PMH of stroke, tobacco abuse.  Clinical Impression  Pt admitted with above diagnosis. Pt currently with functional limitations due to the deficits listed below (see PT Problem List). Prior to admission, pt independent with ADL's and ambulatory with a cane. On PT evaluation, patient presenting with decreased functional mobility secondary to decreased LLE range of motion and pain. Requiring min assist for all functional mobility. Ordered knee immobilizer to provide optimal positioning due to pt tendency to rest in knee flexion and external rotation. Education provided on tobacco cessation in setting of wound healing. Suspect patient will progress well, but will continue to re-assess. Pt will benefit from skilled PT to increase their independence and safety with mobility to allow discharge to the venue listed below.       Follow Up Recommendations Home health PT;Supervision for mobility/OOB    Equipment Recommendations  Rolling walker with 5" wheels;3in1 (PT)    Recommendations for Other Services       Precautions / Restrictions Precautions Precautions: Fall Restrictions Weight Bearing Restrictions: No      Mobility  Bed Mobility Overal bed mobility: Needs Assistance Bed Mobility: Supine to Sit     Supine to sit: Min assist     General bed mobility comments: min assist with use of bed pad to scoot left hip forward  Transfers Overall transfer level: Needs assistance Equipment used: Rolling walker (2 wheeled) Transfers: Stand Pivot Transfers;Sit to/from Stand Sit to Stand: Min assist Stand pivot transfers: Min assist       General transfer comment: Min assist for transfer from elevated bed surface and transfer from bed > chair. Pt holding LLE  in NWB positioning. Cues provided for technique   Ambulation/Gait                Stairs            Wheelchair Mobility    Modified Rankin (Stroke Patients Only)       Balance Overall balance assessment: Needs assistance   Sitting balance-Leahy Scale: Good     Standing balance support: Bilateral upper extremity supported Standing balance-Leahy Scale: Poor                               Pertinent Vitals/Pain Pain Assessment: Faces Faces Pain Scale: Hurts even more Pain Location: surgical site Pain Descriptors / Indicators: Grimacing;Operative site guarding Pain Intervention(s): Monitored during session;Limited activity within patient's tolerance    Home Living Family/patient expects to be discharged to:: Private residence Living Arrangements: Other relatives(brother, cousin) Available Help at Discharge: Family;Available 24 hours/day Type of Home: House Home Access: Stairs to enter   [employer] of Steps: 3   Home Equipment: Clinical cytogeneticist - 4 wheels;Cane - single point      Prior Function Level of Independence: Independent with assistive device(s)         Comments: using cane     Hand Dominance        Extremity/Trunk Assessment   Upper Extremity Assessment Upper Extremity Assessment: Defer to OT evaluation    Lower Extremity Assessment Lower Extremity Assessment: LLE deficits/detail LLE Deficits / Details: Preferring to rest with LLE in flexion and externally rotated.  Communication   Communication: No difficulties  Cognition Arousal/Alertness: Awake/alert Behavior During Therapy: WFL for tasks assessed/performed Overall Cognitive Status: Within Functional Limits for tasks assessed                                        General Comments      Exercises General Exercises - Lower Extremity Quad Sets: Left;5 reps;Seated   Assessment/Plan    PT Assessment Patient needs continued  PT services  PT Problem List Decreased strength;Decreased range of motion;Decreased activity tolerance;Decreased balance;Decreased mobility;Pain       PT Treatment Interventions DME instruction;Gait training;Stair training;Therapeutic activities;Functional mobility training;Therapeutic exercise;Balance training;Patient/family education    PT Goals (Current goals can be found in the Care Plan section)  Acute Rehab PT Goals Patient Stated Goal: "do more tomorrow." PT Goal Formulation: With patient Time For Goal Achievement: 10/18/18 Potential to Achieve Goals: Good    Frequency Min 3X/week   Barriers to discharge        Co-evaluation               AM-PAC PT "6 Clicks" Mobility  Outcome Measure Help needed turning from your back to your side while in a flat bed without using bedrails?: None Help needed moving from lying on your back to sitting on the side of a flat bed without using bedrails?: A Little Help needed moving to and from a bed to a chair (including a wheelchair)?: A Little Help needed standing up from a chair using your arms (e.g., wheelchair or bedside chair)?: A Little Help needed to walk in hospital room?: A Little Help needed climbing 3-5 steps with a railing? : A Lot 6 Click Score: 18    End of Session Equipment Utilized During Treatment: Gait belt Activity Tolerance: Patient limited by pain Patient left: in chair;with call bell/phone within reach;with chair alarm set Nurse Communication: Mobility status PT Visit Diagnosis: Pain;Difficulty in walking, not elsewhere classified (R26.2) Pain - Right/Left: Left Pain - part of body: Leg    Time: 1121-6244 PT Time Calculation (min) (ACUTE ONLY): 25 min   Charges:   PT Evaluation $PT Eval Moderate Complexity: 1 Mod PT Treatments $Gait Training: 8-22 mins        Ellamae Sia, PT, DPT Acute Rehabilitation Services Pager [phone] Office [phone]   [name] 10/04/2018, 1:15  PM

## 2018-10-05 LAB — CBC
HEMATOCRIT: 24.2 % — AB (ref 36.0–46.0)
Hemoglobin: 8.2 g/dL — ABNORMAL LOW (ref 12.0–15.0)
MCH: 31.1 pg (ref 26.0–34.0)
MCHC: 33.9 g/dL (ref 30.0–36.0)
MCV: 91.7 fL (ref 80.0–100.0)
Platelets: 146 10*3/uL — ABNORMAL LOW (ref 150–400)
RBC: 2.64 MIL/uL — ABNORMAL LOW (ref 3.87–5.11)
RDW: 14.8 % (ref 11.5–15.5)
WBC: 11.8 10*3/uL — ABNORMAL HIGH (ref 4.0–10.5)
nRBC: 0 % (ref 0.0–0.2)

## 2018-10-05 MED ORDER — SODIUM CHLORIDE 0.9 % IV SOLN
INTRAVENOUS | Status: DC
  Administered 2018-10-05 – 2018-10-09 (×5): via INTRAVENOUS

## 2018-10-05 NOTE — Plan of Care (Deleted)
  Problem: Acute Rehab OT Goals (only OT should resolve) Goal: Pt. Will Perform Grooming Flowsheets (Taken 10/05/2018 1118) Pt Will Perform Grooming: with supervision;with set-up;sitting   Problem: Acute Rehab OT Goals (only OT should resolve) Goal: Pt. Will Perform Upper Body Bathing Flowsheets (Taken 10/05/2018 1118) Pt Will Perform Upper Body Bathing: with supervision;with set-up;sitting   Problem: Acute Rehab OT Goals (only OT should resolve) Goal: Pt. Will Perform Lower Body Bathing Flowsheets (Taken 10/05/2018 1118) Pt Will Perform Lower Body Bathing: with mod assist;sitting/lateral leans;sit to/from stand;with caregiver independent in assisting   Problem: Acute Rehab OT Goals (only OT should resolve) Goal: Pt. Will Perform Upper Body Dressing Flowsheets (Taken 10/05/2018 1118) Pt Will Perform Upper Body Dressing: with supervision;with set-up;sitting   Problem: Acute Rehab OT Goals (only OT should resolve) Goal: Pt. Will Perform Lower Body Dressing Flowsheets (Taken 10/05/2018 1118) Pt Will Perform Lower Body Dressing: with max assist;with mod assist;sitting/lateral leans;sit to/from stand;with caregiver independent in assisting   Problem: Acute Rehab OT Goals (only OT should resolve) Goal: Pt. Will Transfer To Toilet Flowsheets (Taken 10/05/2018 1118) Pt Will Transfer to Toilet: with min guard assist;ambulating;grab bars   Problem: Acute Rehab OT Goals (only OT should resolve) Goal: Pt. Will Perform Toileting-Clothing Manipulation Flowsheets (Taken 10/05/2018 1118) Pt Will Perform Toileting - Clothing Manipulation and hygiene: with min assist;sit to/from stand

## 2018-10-05 NOTE — Progress Notes (Signed)
Patient complaining of pain and still requesting pain medication. She sat on the bedside commode but has been unable to void. Bladder scanned patient at 1827 and scan shows 476 mL. Patient is currently receiving NS at 66ml/hr. Called Dr. Trula Slade and he advised to insert Foley Cath for urinary retention. Cecilio Asper, RN

## 2018-10-05 NOTE — Progress Notes (Addendum)
  Progress Note    10/05/2018 7:46 AM 2 Days Post-Op  Subjective:  No new complaints   Vitals:   10/05/18 0206 10/05/18 0401  BP: (!) 102/53 115/82  Pulse: 85 85  Resp: (!) 9 12  Temp:  99.1 F (37.3 C)  SpO2: 96% 100%   Physical Exam: Lungs:  Non labored  Incisions:  B groin incisions healing well; L popliteal incision soft, without drainage Extremities:  Brisk DP signals by doppler bilaterally Abdomen:  soft Neurologic: A&O  CBC    Component Value Date/Time   WBC 11.8 (H) 10/05/2018 0258   RBC 2.64 (L) 10/05/2018 0258   HGB 8.2 (L) 10/05/2018 0258   HCT 24.2 (L) 10/05/2018 0258   PLT 146 (L) 10/05/2018 0258   MCV 91.7 10/05/2018 0258   MCH 31.1 10/05/2018 0258   MCHC 33.9 10/05/2018 0258   RDW 14.8 10/05/2018 0258   LYMPHSABS 1.7 10/03/2018 0917   MONOABS 0.8 10/03/2018 0917   EOSABS 0.1 10/03/2018 0917   BASOSABS 0.0 10/03/2018 0917    BMET    Component Value Date/Time   NA 138 10/04/2018 0424   K 3.7 10/04/2018 0424   CL 105 10/04/2018 0424   CO2 21 (L) 10/04/2018 0424   GLUCOSE 137 (H) 10/04/2018 0424   BUN 21 10/04/2018 0424   CREATININE 1.46 (H) 10/04/2018 0424   CREATININE 1.25 (H) 03/02/2018 1043   CALCIUM 8.5 (L) 10/04/2018 0424   GFRNONAA 37 (L) 10/04/2018 0424   GFRNONAA 45 (L) 03/02/2018 1043   GFRAA 43 (L) 10/04/2018 0424   GFRAA 52 (L) 03/02/2018 1043    INR    Component Value Date/Time   INR 1.43 07/14/2016 2038     Intake/Output Summary (Last 24 hours) at 10/05/2018 0746 Last data filed at 10/04/2018 1856 Gross per 24 hour  Intake 480 ml  Output 0 ml  Net 480 ml     Assessment/Plan:  68 y.o. female is s/p R to L fem fem and thrombectomy of L fem-pop 2 Days Post-Op   Perfusing BLE well with brisk DP Encouraged to keep leg straight in bed; refusing to wear long leg splint Case manager to arrange [place] when pain controlled and mobility increased   Dagoberto Ligas, PA-C [facility] [phone] 10/05/2018 7:46 AM  I have examined the patient, reviewed and agree with above.  Curt Jews, MD 10/05/2018 3:17 PM

## 2018-10-05 NOTE — Progress Notes (Signed)
Patient suffers from decreased range of motion and page s/p right to left fem fem bypass and thrombectomy of L fem-pop, which impairs their ability to perform daily activities like walking and performing ADL's in the home.  A walker alone will not resolve the issues with performing activities of daily living. A wheelchair will allow patient to safely perform daily activities.  The patient can self propel in the home or has a caregiver who can provide assistance.     Ellamae Sia, PT, DPT Acute Rehabilitation Services Pager [phone] Office [phone]

## 2018-10-05 NOTE — Care Management Note (Signed)
Case Management Note Marvetta Gibbons RN, BSN Transitions of Care Unit 4E- RN Case Manager [phone]  Patient Details  Name: Wendy Townsend MRN: [id number] Date of Birth: 09-Dec-1950  Subjective/Objective:   Pt admitted s/p R to L fem-fem and thrombectomy of L fem-pop                  Action/Plan: PTA pt lived at home with brother and cousin, order placed for HHPT, CM spoke with pt at bedside for transition of care needs- per pt she states her brother will be at home to assist her along with cousin and niece. She reports niece will transport home. Pt states she has a rollator at home, would like a 3n1 and w/c for home. Orders have been placed for these- choice offered for [facility] and DME needs- list provided to pt per CMS website with star ratings (copy placed in shadow chart)- pt states she has used [facility] in past and wants to use them again- call made to Butch Penny with [facility] for [facility] referral- referral has been accepted- spoke with [facility] with [facility] for DME needs- W/C and 3n1 to be delivered to room prior to discharge.   Expected Discharge Date:                  Expected Discharge Plan:  [place]  In-House Referral:     Discharge planning Services  CM Consult  Post Acute Care Choice:  Durable Medical Equipment, Home Health Choice offered to:  Patient  DME Arranged:  3-N-1, Wheelchair manual DME Agency:  [place]:  PT [place]:  [place]  Status of Service:  In process, will continue to follow  If discussed at Long Length of Stay Meetings, dates discussed:    Discharge Disposition: home/home health   Additional Comments:  Dawayne Patricia, RN 10/05/2018, 12:44 PM

## 2018-10-05 NOTE — Progress Notes (Signed)
   10/05/18 1100  Clinical Encounter Type  Visited With Patient  Visit Type Initial  Referral From Nurse  Consult/Referral To Chaplain  Spiritual Encounters  Spiritual Needs Emotional;Prayer  Stress Factors  Patient Stress Factors Health changes   Responded to spiritual care consult. PT was alert and just received her lunch. PT was open to talk and asked for prayer. I offered spiritual care with words of encouragement, ministry of presence, a listening ear and prayer. Chaplain Available as needed.  Chaplain Fidel Levy [phone]

## 2018-10-05 NOTE — Progress Notes (Signed)
Patient encouraged to keep leg straight. Patient has continuously kept legs folded underneath her even though she has been instructed to keep it straight. Small amount of oozing from left groin site when leg finally straightened out. Patient refused knee immobilizer to straighten leg. Pillow placed under left leg and pressure dressing taped on left groin. Will continue to monitor. Lajoyce Corners, RN

## 2018-10-05 NOTE — Care Management Important Message (Signed)
Important Message  Patient Details  Name: Wendy Townsend MRN: [id number] Date of Birth: 08-Feb-1951   Medicare Important Message Given:  Yes    Jearl Soto P Caryle Helgeson 10/05/2018, 4:59 PM

## 2018-10-05 NOTE — Progress Notes (Signed)
Patient continues to get pain medication and sleep throughout daytime. Patient has not voided since removal of foley catheter 10/04/18 @ 0630. Patient has been scanned multiple times. Last scan at 1330 shows 277 ml in bladder. Patient assisted to bedside commode without any success. PA Matt made aware and will order fluids to assist with hydration. Pt resting with call bell within reach.  Will continue to monitor.  Payton Emerald, RN

## 2018-10-05 NOTE — Progress Notes (Signed)
Patient has not voided all shift. Bladder scan shows 144 ml residual in bladder. Patient not uncomfortable or feeling the need to void. Pt also stated that she has not drank many fluids because of the pain d/t her surgery. Encouraged patient to drink more fluids. Will scan patient again in one hour to see if residual has increased. Will also encourage patient to sit on BSC to void. Will continue to monitor. Lajoyce Corners, RN

## 2018-10-05 NOTE — Progress Notes (Signed)
Physical Therapy Treatment Patient Details Name: Wendy Townsend MRN: [id number] DOB: 11-Jun-1951 Today's Date: 10/05/2018    History of Present Illness Pt is a 68 y.o. F who is s/p R to L fem fem bypass and thrombectomy of L fem-pop. PMH of stroke, tobacco abuse.    PT Comments    Pt is progressing very slowly towards her physical therapy goals. Requires increased encouragement to participate and resting with left knee in ~45 degrees flexion (refusing to don knee immobilizer). Pt requiring moderate assist for transfers and min guard assist for pivotal steps from bed to recliner. Pt requiring significant time and effort for all mobility. Unable to weight bear through LLE. Pt declining going to SNF so recommending wheelchair for mobility for home as pt is unable to ambulate. Will progress as tolerated.     Follow Up Recommendations  Home health PT;Supervision for mobility/OOB     Equipment Recommendations  3in1 (PT);Wheelchair (measurements PT)    Recommendations for Other Services       Precautions / Restrictions Precautions Precautions: Fall Restrictions Weight Bearing Restrictions: No    Mobility  Bed Mobility Overal bed mobility: Needs Assistance Bed Mobility: Supine to Sit     Supine to sit: Min assist     General bed mobility comments: minA for LLE negotiation  Transfers Overall transfer level: Needs assistance Equipment used: Rolling walker (2 wheeled) Transfers: Sit to/from Stand Sit to Stand: Mod assist Stand pivot transfers: Min assist       General transfer comment: ModA for lift assist  Ambulation/Gait Ambulation/Gait assistance: Min guard   Assistive device: Rolling walker (2 wheeled) Gait Pattern/deviations: Antalgic;Trunk flexed     General Gait Details: Pt unable to pick up left foot and "shimmying," over on right foot from bed to chair.    Stairs             Wheelchair Mobility    Modified Rankin (Stroke Patients Only)        Balance Overall balance assessment: Needs assistance   Sitting balance-Leahy Scale: Good     Standing balance support: Bilateral upper extremity supported Standing balance-Leahy Scale: Poor                              Cognition Arousal/Alertness: Awake/alert Behavior During Therapy: WFL for tasks assessed/performed Overall Cognitive Status: Within Functional Limits for tasks assessed                                        Exercises General Exercises - Lower Extremity Ankle Circles/Pumps: 10 reps;Both;Supine Quad Sets: Both;10 reps;Supine    General Comments        Pertinent Vitals/Pain Pain Assessment: 0-10 Pain Score: 10-Worst pain ever Pain Location: "toe." Pain Descriptors / Indicators: Grimacing;Operative site guarding Pain Intervention(s): Monitored during session;Premedicated before session;Limited activity within patient's tolerance    Home Living Family/patient expects to be discharged to:: Private residence Living Arrangements: Other relatives Available Help at Discharge: Family;Available 24 hours/day Type of Home: House Home Access: Stairs to enter   Home Layout: One level Home Equipment: Clinical cytogeneticist - 4 wheels;Cane - single point      Prior Function Level of Independence: Independent with assistive device(s)      Comments: using cane, independent with selfcare   PT Goals (current goals can now be found in the care plan section) Acute  Rehab PT Goals Patient Stated Goal: "get better and go home" Potential to Achieve Goals: Good Progress towards PT goals: Progressing toward goals    Frequency    Min 3X/week      PT Plan Equipment recommendations need to be updated    Co-evaluation              AM-PAC PT "6 Clicks" Mobility   Outcome Measure  Help needed turning from your back to your side while in a flat bed without using bedrails?: None Help needed moving from lying on your back to sitting on the  side of a flat bed without using bedrails?: A Little Help needed moving to and from a bed to a chair (including a wheelchair)?: A Little Help needed standing up from a chair using your arms (e.g., wheelchair or bedside chair)?: A Little Help needed to walk in hospital room?: A Lot Help needed climbing 3-5 steps with a railing? : Total 6 Click Score: 16    End of Session Equipment Utilized During Treatment: Gait belt Activity Tolerance: Patient limited by pain Patient left: in chair;with call bell/phone within reach;with chair alarm set Nurse Communication: Mobility status PT Visit Diagnosis: Pain;Difficulty in walking, not elsewhere classified (R26.2) Pain - Right/Left: Left Pain - part of body: Leg     Time: 3419-3790 PT Time Calculation (min) (ACUTE ONLY): 31 min  Charges:  $Gait Training: 8-22 mins $Therapeutic Exercise: 8-22 mins                    Ellamae Sia, PT, DPT Acute Rehabilitation Services Pager [phone] Office [phone]    [name] 10/05/2018, 12:29 PM

## 2018-10-05 NOTE — Evaluation (Signed)
Occupational Therapy Evaluation Patient Details Name: Wendy Townsend MRN: [id number] DOB: 1951-01-25 Today's Date: 10/05/2018    History of Present Illness Pt is a 68 y.o. F who is s/p R to L fem fem bypass and thrombectomy of L fem-pop. PMH of stroke, tobacco abuse.   Clinical Impression   Pt with decline in function and safety with ADLs and ADL mobility with decreased balance and endurance. Pt is limited by pain in L LE and will not bear weight through L LE during functional mobility using RW to scoot to HOB sitting EOB. Pt required mod/max - total A with LB ADLs. Pt would benefit from acute OT services to address impairments to maximize level of function and safety    Follow Up Recommendations  Home health OT    Equipment Recommendations  None recommended by OT    Recommendations for Other Services       Precautions / Restrictions Precautions Precautions: Fall Restrictions Weight Bearing Restrictions: No      Mobility Bed Mobility               General bed mobility comments: pt in recliner upon arrival  Transfers Overall transfer level: Needs assistance Equipment used: Rolling walker (2 wheeled) Transfers: Stand Pivot Transfers;Sit to/from Stand Sit to Stand: Min assist Stand pivot transfers: Min assist       General transfer comment: min A from recliner to [facility] and back to bed. Pt holding L LE up and not bearing weight    Balance Overall balance assessment: Needs assistance   Sitting balance-Leahy Scale: Good     Standing balance support: Bilateral upper extremity supported Standing balance-Leahy Scale: Poor                             ADL either performed or assessed with clinical judgement   ADL Overall ADL's : Needs assistance/impaired     Grooming: Wash/dry hands;Wash/dry face;Sitting;Min guard   Upper Body Bathing: Min guard;Sitting   Lower Body Bathing: Maximal assistance;Sitting/lateral leans   Upper Body Dressing : Min  guard;Sitting       Toilet Transfer: Total assistance;Cueing for safety;RW;BSC   Toileting- Clothing Manipulation and Hygiene: Maximal assistance;Sit to/from stand;Moderate assistance       Functional mobility during ADLs: Minimal assistance;Cueing for safety;Rolling walker       Vision Patient Visual Report: No change from baseline       Perception     Praxis      Pertinent Vitals/Pain Pain Assessment: 0-10 Pain Score: 7  Pain Location: surgical site Pain Descriptors / Indicators: Grimacing;Operative site guarding Pain Intervention(s): Limited activity within patient's tolerance;Monitored during session;Premedicated before session;Repositioned     Hand Dominance Right   Extremity/Trunk Assessment Upper Extremity Assessment Upper Extremity Assessment: Overall WFL for tasks assessed   Lower Extremity Assessment Lower Extremity Assessment: Defer to PT evaluation   Cervical / Trunk Assessment Cervical / Trunk Assessment: Normal   Communication Communication Communication: No difficulties   Cognition Arousal/Alertness: Awake/alert Behavior During Therapy: WFL for tasks assessed/performed Overall Cognitive Status: Within Functional Limits for tasks assessed                                     General Comments       Exercises     Shoulder Instructions      Home Living Family/patient expects to be discharged to::  Private residence Living Arrangements: Other relatives Available Help at Discharge: Family;Available 24 hours/day Type of Home: House Home Access: Stairs to enter [employer] of Steps: 3   Home Layout: One level     Bathroom Shower/Tub: Occupational psychologist: Handicapped height     Home Equipment: Clinical cytogeneticist - 4 wheels;Cane - single point          Prior Functioning/Environment Level of Independence: Independent with assistive device(s)        Comments: using cane, independent with  selfcare        OT Problem List: Decreased activity tolerance;Decreased knowledge of use of DME or AE;Pain;Impaired balance (sitting and/or standing)      OT Treatment/Interventions: Self-care/ADL training;DME and/or AE instruction;Therapeutic activities;Patient/family education    OT Goals(Current goals can be found in the care plan section) Acute Rehab OT Goals Patient Stated Goal: "get better and go home" OT Goal Formulation: With patient Time For Goal Achievement: 10/19/18 Potential to Achieve Goals: Good ADL Goals Pt Will Perform Grooming: with supervision;with set-up;sitting Pt Will Perform Upper Body Bathing: with supervision;with set-up;sitting Pt Will Perform Lower Body Bathing: with mod assist;sitting/lateral leans;sit to/from stand;with caregiver independent in assisting Pt Will Perform Upper Body Dressing: with supervision;with set-up;sitting Pt Will Perform Lower Body Dressing: with max assist;with mod assist;sitting/lateral leans;sit to/from stand;with caregiver independent in assisting Pt Will Transfer to Toilet: with min guard assist;ambulating;grab bars Pt Will Perform Toileting - Clothing Manipulation and hygiene: with min assist;sit to/from stand  OT Frequency: Min 2X/week   Barriers to D/C:    no barriers       Co-evaluation              AM-PAC OT "6 Clicks" Daily Activity     Outcome Measure Help from another person eating meals?: None Help from another person taking care of personal grooming?: A Little Help from another person toileting, which includes using toliet, bedpan, or urinal?: A Lot Help from another person bathing (including washing, rinsing, drying)?: A Lot Help from another person to put on and taking off regular upper body clothing?: A Little Help from another person to put on and taking off regular lower body clothing?: Total 6 Click Score: 15   End of Session Equipment Utilized During Treatment: Gait belt;Rolling walker;Other  (comment)(BSC)  Activity Tolerance: Patient tolerated treatment well Patient left: in bed;with call bell/phone within reach;with bed alarm set  OT Visit Diagnosis: Unsteadiness on feet (R26.81);Other abnormalities of gait and mobility (R26.89);Muscle weakness (generalized) (M62.81);Pain Pain - Right/Left: Left Pain - part of body: Leg                Time: 8889-1694 OT Time Calculation (min): 32 min Charges:  OT General Charges $OT Visit: 1 Visit OT Evaluation $OT Eval Moderate Complexity: 1 Mod OT Treatments $Self Care/Home Management : 8-22 mins    Emmit Alexanders [facility] 10/05/2018, 11:20 AM

## 2018-10-06 LAB — BASIC METABOLIC PANEL
Anion gap: 12 (ref 5–15)
BUN: 27 mg/dL — ABNORMAL HIGH (ref 8–23)
CHLORIDE: 103 mmol/L (ref 98–111)
CO2: 21 mmol/L — ABNORMAL LOW (ref 22–32)
Calcium: 8.3 mg/dL — ABNORMAL LOW (ref 8.9–10.3)
Creatinine, Ser: 1.94 mg/dL — ABNORMAL HIGH (ref 0.44–1.00)
GFR calc non Af Amer: 26 mL/min — ABNORMAL LOW (ref >60)
GFR, EST AFRICAN AMERICAN: 31 mL/min — AB (ref >60)
Glucose, Bld: 122 mg/dL — ABNORMAL HIGH (ref 70–99)
Potassium: 3.7 mmol/L (ref 3.5–5.1)
Sodium: 136 mmol/L (ref 135–145)

## 2018-10-06 LAB — CBC
HEMATOCRIT: 24.1 % — AB (ref 36.0–46.0)
Hemoglobin: 7.9 g/dL — ABNORMAL LOW (ref 12.0–15.0)
MCH: 30.3 pg (ref 26.0–34.0)
MCHC: 32.8 g/dL (ref 30.0–36.0)
MCV: 92.3 fL (ref 80.0–100.0)
Platelets: 157 10*3/uL (ref 150–400)
RBC: 2.61 MIL/uL — ABNORMAL LOW (ref 3.87–5.11)
RDW: 14.3 % (ref 11.5–15.5)
WBC: 11.7 10*3/uL — ABNORMAL HIGH (ref 4.0–10.5)
nRBC: 0 % (ref 0.0–0.2)

## 2018-10-06 MED ORDER — ENOXAPARIN SODIUM 30 MG/0.3ML ~~LOC~~ SOLN
30.0000 mg | SUBCUTANEOUS | Status: DC
  Administered 2018-10-06 – 2018-10-11 (×6): 30 mg via SUBCUTANEOUS
  Filled 2018-10-06 (×6): qty 0.3

## 2018-10-06 MED ORDER — WHITE PETROLATUM EX OINT
TOPICAL_OINTMENT | CUTANEOUS | Status: AC
  Administered 2018-10-06: 1
  Filled 2018-10-06: qty 28.35

## 2018-10-06 NOTE — Progress Notes (Signed)
Physical Therapy Treatment Patient Details Name: Wendy Townsend MRN: [id number] DOB: 1951-05-28 Today's Date: 10/06/2018    History of Present Illness Pt is a 68 y.o. F who is s/p R to L fem fem bypass and thrombectomy of L fem-pop. PMH of stroke, tobacco abuse.    PT Comments    Patient continues to be a high fall risk. At one point during her transfer she reported she was unable to move. Therapy was able to guide her and encourage her to go back to the bed but it was difficult for her. Therapy talked to her about safety at home. Unless she has significant assist at home she is a high fall risk. She also has stairs into her house. She still will not weight bear on her leg. She agreed after treatment that rehab at a skilled nursing facility may be the best thing for her. Nursing was notified that she now feels she may need rehab. Therapy will continue to work with her while she is at the hospital.   Follow Up Recommendations  SNF;Supervision/Assistance - 24 hour;Home health PT     Equipment Recommendations  3in1 (PT);Wheelchair (measurements PT)    Recommendations for Other Services       Precautions / Restrictions Precautions Precautions: Fall Restrictions Weight Bearing Restrictions: No    Mobility  Bed Mobility Overal bed mobility: Needs Assistance Bed Mobility: Sit to Supine     Supine to sit: Min assist Sit to supine: Mod assist   General bed mobility comments: mod a mod a to get left leg back into bed and mod A to get straight in the bed.   Transfers Overall transfer level: Needs assistance Equipment used: Rolling walker (2 wheeled) Transfers: Sit to/from Stand Sit to Stand: Mod assist Stand pivot transfers: Mod assist       General transfer comment: Patient required mod a to stand. She still is not willing to put weight on her left LE. She was able to shuffle halfway to the bed then reported she coudl go no further. She attmepted to sit but was 1 foot away from  the bed. Therapy adivsed her if she sat she would have to go to the ground. She stood for several minutes before being able to shuffle her foot back towards the bed. She was finnaly able to sit  Ambulation/Gait                 Stairs             Wheelchair Mobility    Modified Rankin (Stroke Patients Only)       Balance Overall balance assessment: Needs assistance   Sitting balance-Leahy Scale: Good     Standing balance support: Bilateral upper extremity supported;During functional activity Standing balance-Leahy Scale: Poor                              Cognition Arousal/Alertness: Awake/alert Behavior During Therapy: WFL for tasks assessed/performed Overall Cognitive Status: Within Functional Limits for tasks assessed                                        Exercises      General Comments        Pertinent Vitals/Pain Pain Assessment: 0-10 Pain Score: 8  Pain Location: L LE Pain Descriptors / Indicators: Grimacing;Operative site guarding Pain Intervention(s):  Limited activity within patient's tolerance;Monitored during session;Premedicated before session    Home Living                      Prior Function            PT Goals (current goals can now be found in the care plan section) Acute Rehab PT Goals Patient Stated Goal: "get better and go home" PT Goal Formulation: With patient Time For Goal Achievement: 10/18/18 Potential to Achieve Goals: Good Progress towards PT goals: Progressing toward goals    Frequency    Min 3X/week      PT Plan Equipment recommendations need to be updated    Co-evaluation              AM-PAC PT "6 Clicks" Mobility   Outcome Measure  Help needed turning from your back to your side while in a flat bed without using bedrails?: A Lot Help needed moving from lying on your back to sitting on the side of a flat bed without using bedrails?: A Lot Help needed moving to  and from a bed to a chair (including a wheelchair)?: A Lot Help needed standing up from a chair using your arms (e.g., wheelchair or bedside chair)?: A Lot Help needed to walk in hospital room?: Total Help needed climbing 3-5 steps with a railing? : Total 6 Click Score: 10    End of Session Equipment Utilized During Treatment: Gait belt Activity Tolerance: Patient limited by pain Patient left: with call bell/phone within reach;in bed;with bed alarm set Nurse Communication: Mobility status PT Visit Diagnosis: Pain;Difficulty in walking, not elsewhere classified (R26.2) Pain - Right/Left: Left Pain - part of body: Leg     Time: 2500-3704 PT Time Calculation (min) (ACUTE ONLY): 23 min  Charges:  $Therapeutic Activity: 23-37 mins                        Carney Living 10/06/2018, 1:35 PM

## 2018-10-06 NOTE — Progress Notes (Signed)
    Subjective  - POD #3  Feels okay this morning   Physical Exam:  Groin incisions are healing nicely as is the left popliteal incision. Bilateral Doppler signals. Left knee contracture       Assessment/Plan:  POD #3  Continue with mobilization and pain control. PT OT to assist with knee contracture.  Wendy Townsend 10/06/2018 11:43 AM --  Vitals:   10/06/18 0100 10/06/18 0344  BP:  125/61  Pulse:  90  Resp: 12 18  Temp:  98.1 F (36.7 C)  SpO2:  99%    Intake/Output Summary (Last 24 hours) at 10/06/2018 1143 Last data filed at 10/06/2018 0900 Gross per 24 hour  Intake 1672.02 ml  Output 875 ml  Net 797.02 ml     Laboratory CBC    Component Value Date/Time   WBC 11.7 (H) 10/06/2018 0336   HGB 7.9 (L) 10/06/2018 0336   HCT 24.1 (L) 10/06/2018 0336   PLT 157 10/06/2018 0336    BMET    Component Value Date/Time   NA 136 10/06/2018 0336   K 3.7 10/06/2018 0336   CL 103 10/06/2018 0336   CO2 21 (L) 10/06/2018 0336   GLUCOSE 122 (H) 10/06/2018 0336   BUN 27 (H) 10/06/2018 0336   CREATININE 1.94 (H) 10/06/2018 0336   CREATININE 1.25 (H) 03/02/2018 1043   CALCIUM 8.3 (L) 10/06/2018 0336   GFRNONAA 26 (L) 10/06/2018 0336   GFRNONAA 45 (L) 03/02/2018 1043   GFRAA 31 (L) 10/06/2018 0336   GFRAA 52 (L) 03/02/2018 1043    COAG Lab Results  Component Value Date   INR 1.43 07/14/2016   INR 1.15 07/07/2016   INR 1.07 07/05/2016   No results found for: PTT  Antibiotics Anti-infectives (From admission, onward)   Start     Dose/Rate Route Frequency Ordered Stop   10/04/18 0600  vancomycin (VANCOCIN) IVPB 1000 mg/200 mL premix     1,000 mg 200 mL/hr over 60 Minutes Intravenous Every 12 hours 10/03/18 2137 10/04/18 1100   10/03/18 1430  vancomycin (VANCOCIN) IVPB 1000 mg/200 mL premix     1,000 mg 200 mL/hr over 60 Minutes Intravenous To [facility]Stay [facility] 10/03/18 1425 10/03/18 1530   10/03/18 1420  vancomycin (VANCOCIN) 1-5 GM/200ML-% IVPB      Note to Pharmacy:  Grace Blight   : cabinet override      10/03/18 1420 10/03/18 1430   10/03/18 1415  vancomycin (VANCOCIN) 1,000 mg in sodium chloride 0.9 % 250 mL IVPB  Status:  Discontinued     1,000 mg 250 mL/hr over 60 Minutes Intravenous  Once 10/03/18 1405 10/03/18 1424       V. [name], M.D. [facility] Office: [phone] Pager:  [phone]

## 2018-10-06 NOTE — Progress Notes (Signed)
Occupational Therapy Treatment Patient Details Name: Wendy Townsend MRN: [id number] DOB: 1951-02-03 Today's Date: 10/06/2018    History of present illness Pt is a 68 y.o. F who is s/p R to L fem fem bypass and thrombectomy of L fem-pop. PMH of stroke, tobacco abuse.   OT comments  Pt making slow progress with functional goals, requiring encouragement to participate in EOB and OOB activity. Pt will not bear weight on L LE during sit -stand transitions or transfers to [facility] and recliner. Pt educated on risks of prolonged periods of beng in the bed and the benefits of being OOB/activity. Pt continues to decline SNF for rehab after acute stay. OT will continue to follow acutely  Follow Up Recommendations  Home health OT    Equipment Recommendations  None recommended by OT    Recommendations for Other Services      Precautions / Restrictions Precautions Precautions: Fall Restrictions Weight Bearing Restrictions: No       Mobility Bed Mobility Overal bed mobility: Needs Assistance Bed Mobility: Supine to Sit     Supine to sit: Min assist     General bed mobility comments: min A with L LE  Transfers Overall transfer level: Needs assistance Equipment used: Rolling walker (2 wheeled) Transfers: Sit to/from Stand Sit to Stand: Mod assist Stand pivot transfers: Min assist       General transfer comment: Mod A for lift assist from bed to RW, pt will not bear weigth on L LE    Balance Overall balance assessment: Needs assistance   Sitting balance-Leahy Scale: Good     Standing balance support: Bilateral upper extremity supported;During functional activity Standing balance-Leahy Scale: Poor                             ADL either performed or assessed with clinical judgement   ADL Overall ADL's : Needs assistance/impaired     Grooming: Wash/dry hands;Wash/dry face;Sitting;Supervision/safety;Set up   Upper Body Bathing: Set up;Supervision/ safety;Sitting    Lower Body Bathing: Maximal assistance;Sitting/lateral leans Lower Body Bathing Details (indicate cue type and reason): simulated Upper Body Dressing : Set up;Supervision/safety;Sitting   Lower Body Dressing: Total assistance   Toilet Transfer: Moderate assistance;Minimal assistance;BSC;Stand-pivot   Toileting- Clothing Manipulation and Hygiene: Maximal assistance;Sit to/from stand;Moderate assistance       Functional mobility during ADLs: Moderate assistance;Minimal assistance       Vision Patient Visual Report: No change from baseline     Perception     Praxis      Cognition Arousal/Alertness: Awake/alert Behavior During Therapy: WFL for tasks assessed/performed Overall Cognitive Status: Within Functional Limits for tasks assessed                                          Exercises     Shoulder Instructions       General Comments      Pertinent Vitals/ Pain       Pain Assessment: 0-10 Pain Score: 7  Pain Location: L LE Pain Descriptors / Indicators: Grimacing;Operative site guarding Pain Intervention(s): Limited activity within patient's tolerance;Monitored during session;Repositioned  Home Living  Prior Functioning/Environment              Frequency  Min 2X/week        Progress Toward Goals  OT Goals(current goals can now be found in the care plan section)  Progress towards OT goals: Not progressing toward goals - comment     Plan Discharge plan remains appropriate    Co-evaluation                 AM-PAC OT "6 Clicks" Daily Activity     Outcome Measure   Help from another person eating meals?: None Help from another person taking care of personal grooming?: A Little Help from another person toileting, which includes using toliet, bedpan, or urinal?: A Lot Help from another person bathing (including washing, rinsing, drying)?: A Lot Help from another  person to put on and taking off regular upper body clothing?: A Little Help from another person to put on and taking off regular lower body clothing?: Total 6 Click Score: 15    End of Session Equipment Utilized During Treatment: Gait belt;Rolling walker;Other (comment)(BSC)  OT Visit Diagnosis: Unsteadiness on feet (R26.81);Other abnormalities of gait and mobility (R26.89);Muscle weakness (generalized) (M62.81);Pain Pain - Right/Left: Left Pain - part of body: Leg   Activity Tolerance Patient tolerated treatment well;Patient limited by pain   Patient Left with call bell/phone within reach;in chair   Nurse Communication          Time: 2831-5176 OT Time Calculation (min): 38 min  Charges: OT General Charges $OT Visit: 1 Visit OT Treatments $Self Care/Home Management : 23-37 mins $Therapeutic Activity: 8-22 mins     Emmit Alexanders [facility] 10/06/2018, 11:12 AM

## 2018-10-07 LAB — BASIC METABOLIC PANEL
Anion gap: 10 (ref 5–15)
BUN: 20 mg/dL (ref 8–23)
CO2: 23 mmol/L (ref 22–32)
Calcium: 8.6 mg/dL — ABNORMAL LOW (ref 8.9–10.3)
Chloride: 105 mmol/L (ref 98–111)
Creatinine, Ser: 1.55 mg/dL — ABNORMAL HIGH (ref 0.44–1.00)
GFR, EST AFRICAN AMERICAN: 40 mL/min — AB (ref >60)
GFR, EST NON AFRICAN AMERICAN: 35 mL/min — AB (ref >60)
Glucose, Bld: 112 mg/dL — ABNORMAL HIGH (ref 70–99)
Potassium: 3.9 mmol/L (ref 3.5–5.1)
SODIUM: 138 mmol/L (ref 135–145)

## 2018-10-07 NOTE — NC FL2 (Signed)
[place] MEDICAID FL2 LEVEL OF CARE SCREENING TOOL     IDENTIFICATION  Patient Name: Wendy Townsend Birthdate: 1951/04/04 Sex: female Admission Date (Current Location): 10/03/2018  [facility] and [place] Number:  Herbalist and Address:  [facility] [place] [place] [facility] [address], [place], [place] [address]      Provider Number: [id number]  Attending Physician Name and Address:  Rosetta Posner, MD  Relative Name and Phone Number:       Current Level of Care: Hospital Recommended Level of Care: [place] Prior Approval Number:    Date Approved/Denied:   PASRR Number: [id number]  Discharge Plan: SNF    Current Diagnoses: Patient Active Problem List   Diagnosis Date Noted  . History of tobacco abuse 08/10/2016  . Debilitated 07/20/2016  . Anemia of chronic disease   . Prediabetes   . Tobacco abuse   . Elevated bilirubin   . History of CVA (cerebrovascular accident)   . Benign essential HTN   . Diastolic dysfunction   . Coronary artery disease involving native coronary artery of native heart without angina pectoris   . Post-operative pain   . Acute blood loss anemia   . Diarrhea   . Hypophosphatemia   . Hypomagnesemia   . Hypoalbuminemia due to protein-calorie malnutrition ([place])   . Lymphocytosis   . Encounter for rehabilitation 07/18/2016  . Status post aortobifemoral bypass surgery   . Acute respiratory failure with hypoxia ([place])   . Acute renal failure superimposed on stage 3 chronic kidney disease ([place]) 07/13/2016  . Essential hypertension 07/13/2016  . Impaired glucose tolerance 07/13/2016  . Ischemic pain of foot, left   . Abnormal stress test   . Dyspnea   . Dry gangrene ([place]) 07/06/2016  . PVD (peripheral vascular disease) ([place]) 07/06/2016  . Hyperglycemia 07/06/2016  . Acute kidney injury superimposed on chronic kidney disease ([place]) 07/06/2016  . Protein-calorie malnutrition, severe 07/06/2016  . Ischemic pain of  left foot 07/05/2016  . History of stroke 06/08/2016  . Left hemiparesis ([place]) 06/08/2016  . Numbness and tingling of left leg 06/08/2016  . Tobacco use disorder 06/08/2016  . Hypertension 06/28/2011  . Hyperlipidemia 06/28/2011  . PAD (peripheral artery disease) ([place]) 06/28/2011    Orientation RESPIRATION BLADDER Height & Weight     Self, Time, Situation, Place  Normal Continent, Indwelling catheter Weight: 135 lb (61.2 kg) Height:  5\' 4"  (162.6 cm)  BEHAVIORAL SYMPTOMS/MOOD NEUROLOGICAL BOWEL NUTRITION STATUS      Continent Diet(See DC summary)  AMBULATORY STATUS COMMUNICATION OF NEEDS Skin   Extensive Assist Verbally Surgical wounds(closed incisions: left and right groin, liquid adhesive; left leg, gauze dressing)                       Personal Care Assistance Level of Assistance  Bathing, Feeding, Dressing Bathing Assistance: Limited assistance Feeding assistance: Independent Dressing Assistance: Limited assistance     Functional Limitations Info  Hearing, Speech, Sight Sight Info: Adequate Hearing Info: Adequate Speech Info: Adequate    SPECIAL CARE FACTORS FREQUENCY  PT (By licensed PT), OT (By licensed OT)     PT Frequency: 5x/wk OT Frequency: 5x/wk            Contractures Contractures Info: Not present    Additional Factors Info  Code Status, Allergies Code Status Info: Full Allergies Info: Penicillins           Current Medications (10/07/2018):  This is the current  hospital active medication list Current Facility-Administered Medications  Medication Dose Route Frequency Provider Last Rate Last Dose  . 0.9 %  sodium chloride infusion  500 mL Intravenous Once PRN Laurence Slate M, PA-C      . 0.9 %  sodium chloride infusion   Intravenous Continuous Dagoberto Ligas, PA-C 75 mL/hr at 10/06/18 1759    . acetaminophen (TYLENOL) tablet 325-650 mg  325-650 mg Oral Q4H PRN Laurence Slate M, PA-C       Or  . acetaminophen (TYLENOL) suppository 325-650  mg  325-650 mg Rectal Q4H PRN Ulyses Amor, PA-C      . alum & mag hydroxide-simeth (MAALOX/MYLANTA) 200-200-20 MG/5ML suspension 15-30 mL  15-30 mL Oral Q2H PRN Laurence Slate M, PA-C      . aspirin EC tablet 81 mg  81 mg Oral Daily Laurence Slate M, PA-C   81 mg at 10/07/18 4970  . atorvastatin (LIPITOR) tablet 80 mg  80 mg Oral Daily Laurence Slate M, PA-C   80 mg at 10/07/18 2637  . bisacodyl (DULCOLAX) EC tablet 5 mg  5 mg Oral Daily PRN Laurence Slate M, PA-C      . carvedilol (COREG) tablet 3.125 mg  3.125 mg Oral BID WC Laurence Slate M, PA-C   3.125 mg at 10/07/18 0934  . cloNIDine (CATAPRES) tablet 0.2 mg  0.2 mg Oral BID Laurence Slate M, PA-C   0.2 mg at 10/07/18 8588  . docusate sodium (COLACE) capsule 100 mg  100 mg Oral Daily Laurence Slate M, PA-C   100 mg at 10/07/18 0935  . enoxaparin (LOVENOX) injection 30 mg  30 mg Subcutaneous Q24H Rhyne, Samantha J, PA-C   30 mg at 10/07/18 0935  . guaiFENesin-dextromethorphan (ROBITUSSIN DM) 100-10 MG/5ML syrup 15 mL  15 mL Oral Q4H PRN Laurence Slate M, PA-C      . hydrALAZINE (APRESOLINE) injection 5 mg  5 mg Intravenous Q20 Min PRN Laurence Slate M, PA-C      . hydrochlorothiazide (HYDRODIURIL) tablet 25 mg  25 mg Oral Daily Laurence Slate M, PA-C   25 mg at 10/07/18 5027  . HYDROmorphone (DILAUDID) injection 1-2 mg  1-2 mg Intravenous Q2H PRN Early, Arvilla Meres, MD   1 mg at 10/04/18 0443  . labetalol (NORMODYNE,TRANDATE) injection 10 mg  10 mg Intravenous Q10 min PRN Laurence Slate M, PA-C      . magnesium sulfate IVPB 2 g 50 mL  2 g Intravenous Daily PRN Laurence Slate M, PA-C      . metoprolol tartrate (LOPRESSOR) injection 2-5 mg  2-5 mg Intravenous Q2H PRN Laurence Slate M, PA-C      . ondansetron [facility]) injection 4 mg  4 mg Intravenous Q6H PRN Laurence Slate M, PA-C      . oxyCODONE (Oxy IR/ROXICODONE) immediate release tablet 5-10 mg  5-10 mg Oral Q4H PRN Laurence Slate M, PA-C   10 mg at 10/07/18 0935  . pantoprazole (PROTONIX) EC tablet 40 mg   40 mg Oral Daily Laurence Slate M, PA-C   40 mg at 10/07/18 0935  . phenol (CHLORASEPTIC) mouth spray 1 spray  1 spray Mouth/Throat PRN Laurence Slate M, PA-C      . potassium chloride SA (K-DUR,KLOR-CON) CR tablet 20-40 mEq  20-40 mEq Oral Daily PRN Laurence Slate M, PA-C      . senna-docusate (Senokot-S) tablet 1 tablet  1 tablet Oral QHS PRN Ulyses Amor, PA-C         Discharge Medications:  Please see discharge summary for a list of discharge medications.  Relevant Imaging Results:  Relevant Lab Results:   Additional Information SS#: [id number]  Geralynn Ochs, LCSW

## 2018-10-07 NOTE — Progress Notes (Addendum)
  Progress Note    10/07/2018 9:20 AM 4 Days Post-Op  Subjective:  No complaints  Tm 99.5 VSS 94% RA  Vitals:   10/06/18 2351 10/07/18 0418  BP: (!) 148/82 125/70  Pulse: 89 82  Resp:  14  Temp: 99.5 F (37.5 C) 98.3 F (36.8 C)  SpO2: 96% 94%    Physical Exam: Cardiac:  regular Lungs:  Non labored Incisions:  ight groin with some mild bloody drainage-incision ok. Extremities:  brisk DP signals Left > right   CBC    Component Value Date/Time   WBC 11.7 (H) 10/06/2018 0336   RBC 2.61 (L) 10/06/2018 0336   HGB 7.9 (L) 10/06/2018 0336   HCT 24.1 (L) 10/06/2018 0336   PLT 157 10/06/2018 0336   MCV 92.3 10/06/2018 0336   MCH 30.3 10/06/2018 0336   MCHC 32.8 10/06/2018 0336   RDW 14.3 10/06/2018 0336   LYMPHSABS 1.7 10/03/2018 0917   MONOABS 0.8 10/03/2018 0917   EOSABS 0.1 10/03/2018 0917   BASOSABS 0.0 10/03/2018 0917    BMET    Component Value Date/Time   NA 138 10/07/2018 0330   K 3.9 10/07/2018 0330   CL 105 10/07/2018 0330   CO2 23 10/07/2018 0330   GLUCOSE 112 (H) 10/07/2018 0330   BUN 20 10/07/2018 0330   CREATININE 1.55 (H) 10/07/2018 0330   CREATININE 1.25 (H) 03/02/2018 1043   CALCIUM 8.6 (L) 10/07/2018 0330   GFRNONAA 35 (L) 10/07/2018 0330   GFRNONAA 45 (L) 03/02/2018 1043   GFRAA 40 (L) 10/07/2018 0330   GFRAA 52 (L) 03/02/2018 1043    INR    Component Value Date/Time   INR 1.43 07/14/2016 2038     Intake/Output Summary (Last 24 hours) at 10/07/2018 0920 Last data filed at 10/07/2018 0423 Gross per 24 hour  Intake -  Output 600 ml  Net -600 ml     Assessment:  68 y.o. female is s/p:   Attempted thrombectomy left limb aortobifemoral bypass.  Right to left femorofemoral bypass and thrombectomy of left femoral to popliteal bypass  4 Days Post-Op  Plan: -pt with brisk DP signals Left > right  -right groin with some mild bloody drainage-incision ok.  Continue to monitor -left knee contracture-continue to encourage her to work  on this -DVT prophylaxis:  Lovenox -creatinine improved today -low grade fever-probably atelectasis-encourage IS every hour   Leontine Locket, PA-C [facility] [phone] 10/07/2018 9:20 AM  I agree with the above.  I have seen and evaluated the patient.  She has brisk Doppler signals.  Incisions on the left leg are healing nicely.  The right leg incision is closed however there has been some bloody drainage.  A light pressure dressing was applied.  I tried to straighten her leg with some success.  She still has a contracture.  She will need assistance with this to get back to ambulatory status.  Annamarie Major

## 2018-10-08 ENCOUNTER — Encounter (HOSPITAL_COMMUNITY): Payer: Self-pay | Admitting: Vascular Surgery

## 2018-10-08 NOTE — Plan of Care (Signed)
  Problem: Health Behavior/Discharge Planning: Goal: Ability to manage health-related needs will improve Outcome: Progressing   Problem: Clinical Measurements: Goal: Ability to maintain clinical measurements within normal limits will improve Outcome: Progressing Goal: Diagnostic test results will improve Outcome: Progressing   Problem: Nutrition: Goal: Adequate nutrition will be maintained Outcome: Progressing   Problem: Coping: Goal: Level of anxiety will decrease Outcome: Progressing   Problem: Elimination: Goal: Will not experience complications related to bowel motility Outcome: Progressing Goal: Will not experience complications related to urinary retention Outcome: Progressing   Problem: Pain Managment: Goal: General experience of comfort will improve Outcome: Progressing

## 2018-10-08 NOTE — Plan of Care (Signed)
Care plans reviewed and patient is progressing.  

## 2018-10-08 NOTE — Progress Notes (Signed)
Pt continues to maintain L LE in flexion, despite multiple therapists educating pt on benefits of positioning in extension and quad activation exercises. Pt declined getting to chair, but agreed to activities at EOB and one standing trial. Pt not placing weight through L LE. Updated d/c recommendation to SNF. Will continue to follow.    10/08/18 1100  OT Visit Information  Last OT Received On 10/08/18  Assistance Needed +1 (transfers)  History of Present Illness Pt is a 68 y.o. F who is s/p R to L fem fem bypass and thrombectomy of L fem-pop. PMH of stroke, tobacco abuse.  Precautions  Precautions Fall  Precaution Comments maintains L LE in flexed position unless cued  Pain Assessment  Pain Assessment Faces  Faces Pain Scale 2  Pain Location L LE  Pain Descriptors / Indicators Operative site guarding  Pain Intervention(s) Monitored during session;Repositioned  Cognition  Arousal/Alertness Awake/alert  Behavior During Therapy WFL for tasks assessed/performed  Overall Cognitive Status Impaired/Different from baseline  Area of Impairment Memory  Memory Decreased short-term memory  General Comments poor insight and follow through with L LE exercises/positioning  ADL  Overall ADL's  Needs assistance/impaired  Grooming Wash/dry hands;Wash/dry face;Sitting;Set up  Lower Body Dressing Maximal assistance;Sitting/lateral leans  Lower Body Dressing Details (indicate cue type and reason) donned R sock only, total assist for L sock  General ADL Comments pt agreeable to getting up to chair for meals  Bed Mobility  Overal bed mobility Needs Assistance  Bed Mobility Supine to Sit;Sit to Supine  Supine to sit Mod assist  Sit to supine Mod assist  General bed mobility comments assist for hips to EOB using bed pad and for LEs back into bed  Balance  Overall balance assessment Needs assistance  Sitting balance-Leahy Scale Good  Restrictions  Weight Bearing Restrictions No  Other  Position/Activity Restrictions pt reluctant to place weight on L foot  Transfers  Transfers Sit to/from Stand  Sit to Stand Mod assist  General transfer comment agreeable to practice standing from EOB, but not to get to chair, assist to rise and steady, cues for hand placement  OT - End of Session  Equipment Utilized During Treatment Gait belt;Rolling walker  Activity Tolerance Patient tolerated treatment well  Patient left with call bell/phone within reach;in bed  OT Assessment/Plan  OT Plan Discharge plan needs to be updated  OT Visit Diagnosis Unsteadiness on feet (R26.81);Other abnormalities of gait and mobility (R26.89);Muscle weakness (generalized) (M62.81);Pain  Pain - Right/Left Left  Pain - part of body Leg  OT Frequency (ACUTE ONLY) Min 2X/week  Follow Up Recommendations SNF;Supervision/Assistance - 24 hour  OT Equipment None recommended by OT  AM-PAC OT "6 Clicks" Daily Activity Outcome Measure (Version 2)  Help from another person eating meals? 4  Help from another person taking care of personal grooming? 3  Help from another person toileting, which includes using toliet, bedpan, or urinal? 2  Help from another person bathing (including washing, rinsing, drying)? 2  Help from another person to put on and taking off regular upper body clothing? 3  Help from another person to put on and taking off regular lower body clothing? 2  6 Click Score 16  OT Goal Progression  Progress towards OT goals Not progressing toward goals - comment (pt remains reluctant to put weight on L LE)  Acute Rehab OT Goals  Patient Stated Goal "get better and go home"  OT Goal Formulation With patient  Time For Goal Achievement 10/19/18  Potential to Achieve Goals Good  OT Time Calculation  OT Start Time (ACUTE ONLY) 1100  OT Stop Time (ACUTE ONLY) 1117  OT Time Calculation (min) 17 min  OT General Charges  $OT Visit 1 Visit  OT Treatments  $Self Care/Home Management  8-22 mins  Nestor Lewandowsky, OTR/L Acute Rehabilitation Services Pager: [phone] Office: [phone]

## 2018-10-08 NOTE — Progress Notes (Signed)
Called regarding possible removal of foley cath.  Pt with renal dysfunction over the weekend and plan was to leave foley until creat stabilizes.  Follow up labs tomorrow  Ruta Hinds, MD [facility] [place] Office: [phone] Pager: [phone]

## 2018-10-08 NOTE — Progress Notes (Signed)
S/W  Dr. Oneida Alar r/t foley catheter.  Concerns r/t renal function, will keep foley at this time and reassess at later date.

## 2018-10-08 NOTE — Progress Notes (Signed)
Physical Therapy Treatment Patient Details Name: Wendy Townsend MRN: [id number] DOB: May 05, 1951 Today's Date: 10/08/2018    History of Present Illness Pt is a 68 y.o. F who is s/p R to L fem fem bypass and thrombectomy of L fem-pop. PMH of stroke, tobacco abuse.    PT Comments    Pt was able to assist with getting to side of bed, however is still not effectively using LLE to allow gait and certainly stairs to navigate in her home setting.  After stretches on LLE, was able to let LLE repose in extension as nursing arrived to check on her.  Talked with nurse and student about the plan to let her leg sit stretching as well as to work with nursing on the stretches.  Will continue acutely as she prepares to get to rehab setting for recovery of her independent mobility as just prior to her bypass procedure.   Follow Up Recommendations  SNF;Supervision/Assistance - 24 hour     Equipment Recommendations  3in1 (PT);Wheelchair (measurements PT)    Recommendations for Other Services       Precautions / Restrictions Precautions Precautions: Fall Precaution Comments: monitor posture of LLE for flexion to avoid a contracture Restrictions Weight Bearing Restrictions: No    Mobility  Bed Mobility Overal bed mobility: Needs Assistance Bed Mobility: Supine to Sit;Sit to Supine     Supine to sit: Mod assist Sit to supine: Mod assist   General bed mobility comments: mod to scoot out on bed and mod to assist legs back to bed  Transfers Overall transfer level: Needs assistance Equipment used: Rolling walker (2 wheeled) Transfers: Sit to/from Stand Sit to Stand: Mod assist            Ambulation/Gait             General Gait Details: attempted but cannot walk   Stairs             Wheelchair Mobility    Modified Rankin (Stroke Patients Only)       Balance   Sitting-balance support: Feet supported;Bilateral upper extremity supported Sitting balance-Leahy Scale:  Good     Standing balance support: Bilateral upper extremity supported;During functional activity Standing balance-Leahy Scale: Fair Standing balance comment: touching on LLE to steady                            Cognition Arousal/Alertness: Awake/alert Behavior During Therapy: WFL for tasks assessed/performed Overall Cognitive Status: Within Functional Limits for tasks assessed                                        Exercises General Exercises - Lower Extremity Ankle Circles/Pumps: PROM;Left;5 reps Short Arc Quad: PROM;Left    General Comments General comments (skin integrity, edema, etc.): pt was able to extend her L knee fully with PROM but is not able to maintain actively, instruction to nursing about leaving her in extended leg posture for stretch on a regular basis      Pertinent Vitals/Pain Pain Assessment: Faces Faces Pain Scale: Hurts little more Pain Location: L LE Pain Descriptors / Indicators: Operative site guarding Pain Intervention(s): Limited activity within patient's tolerance;Monitored during session;Premedicated before session;Repositioned    Home Living                      Prior Function  PT Goals (current goals can now be found in the care plan section) Acute Rehab PT Goals Patient Stated Goal: "get better and go home" Progress towards PT goals: Progressing toward goals    Frequency    Min 3X/week      PT Plan Equipment recommendations need to be updated    Co-evaluation              AM-PAC PT "6 Clicks" Mobility   Outcome Measure  Help needed turning from your back to your side while in a flat bed without using bedrails?: A Lot Help needed moving from lying on your back to sitting on the side of a flat bed without using bedrails?: A Lot Help needed moving to and from a bed to a chair (including a wheelchair)?: A Lot Help needed standing up from a chair using your arms (e.g.,  wheelchair or bedside chair)?: A Lot Help needed to walk in hospital room?: Total Help needed climbing 3-5 steps with a railing? : Total 6 Click Score: 10    End of Session Equipment Utilized During Treatment: Gait belt Activity Tolerance: Patient limited by pain Patient left: with call bell/phone within reach;in bed;with bed alarm set Nurse Communication: Mobility status PT Visit Diagnosis: Pain;Difficulty in walking, not elsewhere classified (R26.2) Pain - Right/Left: Left Pain - part of body: Leg     Time: 0156-1537 PT Time Calculation (min) (ACUTE ONLY): 28 min  Charges:  $Therapeutic Exercise: 8-22 mins $Therapeutic Activity: 8-22 mins                  Ramond Dial 10/08/2018, 9:36 AM  Mee Hives, PT MS Acute Rehab Dept. Number: [place] and [place]

## 2018-10-08 NOTE — Progress Notes (Addendum)
  Progress Note    10/08/2018 7:27 AM 5 Days Post-Op  Subjective:  No new complaints   Vitals:   10/07/18 2348 10/08/18 0328  BP: 116/69 (!) 150/74  Pulse: 81 87  Resp: 11 16  Temp: 98.9 F (37.2 C) 97.8 F (36.6 C)  SpO2: 96% 99%   Physical Exam: Lungs:  Non labored Incisions:  R groin incision with serosanguinous collection on dressing but no active drainage when bandage removed; L froin unremarkable; L popliteal incision healing well Extremities:  DP signals brisk by doppler; LLE contracted Abdomen:  Soft Neurologic: A&O  CBC    Component Value Date/Time   WBC 11.7 (H) 10/06/2018 0336   RBC 2.61 (L) 10/06/2018 0336   HGB 7.9 (L) 10/06/2018 0336   HCT 24.1 (L) 10/06/2018 0336   PLT 157 10/06/2018 0336   MCV 92.3 10/06/2018 0336   MCH 30.3 10/06/2018 0336   MCHC 32.8 10/06/2018 0336   RDW 14.3 10/06/2018 0336   LYMPHSABS 1.7 10/03/2018 0917   MONOABS 0.8 10/03/2018 0917   EOSABS 0.1 10/03/2018 0917   BASOSABS 0.0 10/03/2018 0917    BMET    Component Value Date/Time   NA 138 10/07/2018 0330   K 3.9 10/07/2018 0330   CL 105 10/07/2018 0330   CO2 23 10/07/2018 0330   GLUCOSE 112 (H) 10/07/2018 0330   BUN 20 10/07/2018 0330   CREATININE 1.55 (H) 10/07/2018 0330   CREATININE 1.25 (H) 03/02/2018 1043   CALCIUM 8.6 (L) 10/07/2018 0330   GFRNONAA 35 (L) 10/07/2018 0330   GFRNONAA 45 (L) 03/02/2018 1043   GFRAA 40 (L) 10/07/2018 0330   GFRAA 52 (L) 03/02/2018 1043    INR    Component Value Date/Time   INR 1.43 07/14/2016 2038     Intake/Output Summary (Last 24 hours) at 10/08/2018 6045 Last data filed at 10/08/2018 0330 Gross per 24 hour  Intake 350 ml  Output 1500 ml  Net -1150 ml     Assessment/Plan:  68 y.o. female is s/p R to L fem fem bypass with thrombectomy of LLE bypass 5 Days Post-Op   Brisk DP signal by doppler bilaterally Keep dry gauze in each groin and change as needed Contracted LLE; manually straightened on exam; patient still  unwilling to wear long leg splint Continue to monitor renal function; foley replaced over the weekend; bmp pending ABL anemia: recheck CBC in am Social work on board for SNF placement    Dagoberto Ligas, PA-C [facility] [phone] 10/08/2018 7:27 AM  I agree with the above Right groin bleeeding has nearly resolved Left knee still contracted, but improved.  Will work on this today  [employer]

## 2018-10-09 LAB — CBC
HEMATOCRIT: 21.3 % — AB (ref 36.0–46.0)
Hemoglobin: 7 g/dL — ABNORMAL LOW (ref 12.0–15.0)
MCH: 29.9 pg (ref 26.0–34.0)
MCHC: 32.9 g/dL (ref 30.0–36.0)
MCV: 91 fL (ref 80.0–100.0)
Platelets: 235 10*3/uL (ref 150–400)
RBC: 2.34 MIL/uL — ABNORMAL LOW (ref 3.87–5.11)
RDW: 14.2 % (ref 11.5–15.5)
WBC: 8 10*3/uL (ref 4.0–10.5)
nRBC: 0 % (ref 0.0–0.2)

## 2018-10-09 LAB — PREPARE RBC (CROSSMATCH)

## 2018-10-09 LAB — BASIC METABOLIC PANEL
Anion gap: 10 (ref 5–15)
BUN: 8 mg/dL (ref 8–23)
CO2: 24 mmol/L (ref 22–32)
Calcium: 8.4 mg/dL — ABNORMAL LOW (ref 8.9–10.3)
Chloride: 104 mmol/L (ref 98–111)
Creatinine, Ser: 1.28 mg/dL — ABNORMAL HIGH (ref 0.44–1.00)
GFR calc non Af Amer: 44 mL/min — ABNORMAL LOW (ref >60)
GFR, EST AFRICAN AMERICAN: 50 mL/min — AB (ref >60)
Glucose, Bld: 113 mg/dL — ABNORMAL HIGH (ref 70–99)
Potassium: 3.5 mmol/L (ref 3.5–5.1)
Sodium: 138 mmol/L (ref 135–145)

## 2018-10-09 MED ORDER — SODIUM CHLORIDE 0.9% IV SOLUTION
Freq: Once | INTRAVENOUS | Status: AC
  Administered 2018-10-09: 16:00:00 via INTRAVENOUS

## 2018-10-09 NOTE — Clinical Social Work Note (Signed)
Clinical Social Work Assessment  Patient Details  Name: Wendy Townsend MRN: [id number] Date of Birth: [birth year]  Date of referral:  10/08/18               Reason for consult:  Facility Placement                Permission sought to share information with:  Facility Sport and exercise psychologist, Family Supports Permission granted to share information::  Yes, Verbal Permission Granted  Name::     Feliz Beam   Agency::  [facility]NF[facility]  Relationship::  Niece  Contact Information:  [phone]  Housing/Transportation Living arrangements for the past 2 months:  Single Family Home Source of Information:  Patient Patient Interpreter Needed:  None Criminal Activity/Legal Involvement Pertinent to Current Situation/Hospitalization:  No - Comment as needed Significant Relationships:  Other Family Members Lives with:  Siblings, Other (Comment)(brother and cousin) Do you feel safe going back to the place where you live?  No Need for family participation in patient care:  Yes (Comment)  Care giving concerns:  CSW received consult for discharge needs. CSW spoke with patient regarding PT recommendation of SNF placement at time of discharge. Patient was alert and oriented. Patient states she lives in the home with her brother and cousin. She states they help her as needed. She expressed she wants to do what is best for her and follow recommendation of PT.    Social Worker assessment / plan:  CSW spoke with patient concerning possibility of rehab at [facility] before returning home.   Employment status:  Retired Forensic scientist:  Medicare PT Recommendations:  [place] / Referral to community resources:  [place]  Patient/Family's Response to care:  Patient recognizes need for rehab before returning home and is agreeable to a SNF placement. Patient reported preference for [facility] Patient states if she could go there she was going home.  CSW sent  referral to [facility] and was informed patient insurance is not in network therefore it only covered 50% and she would have to pay the other portion. CSW informed the patient and she was willing to select another SNF. CSW informed the patient of her bed offers and she choose [facility].   [facility] has confirmed placement and will start insurance authorization with [employer].   Patient/Family's Understanding of and Emotional Response to Diagnosis, Current Treatment, and Prognosis:  Patient is realistic regarding therapy needs and expressed being hopeful for SNF placement. Patient expressed understanding of CSW role and discharge process as well as medical condition. No questions/concerns about plan or treatment at this time.   Emotional Assessment Appearance:  Appears stated age Attitude/Demeanor/Rapport:  Self-Confident, Engaged Affect (typically observed):  Appropriate, Pleasant, Accepting, Other(friendly) Orientation:  Oriented to Self, Oriented to Place, Oriented to  Time, Oriented to Situation Alcohol / Substance use:  Not Applicable Psych involvement (Current and /or in the community):  No (Comment)  Discharge Needs  Concerns to be addressed:  Care Coordination Readmission within the last 30 days:  No Current discharge risk:  Dependent with Mobility Barriers to Discharge:  Continued Medical Work up   [employer], [place] 10/09/2018, 10:25 AM

## 2018-10-09 NOTE — Progress Notes (Addendum)
  Progress Note    10/09/2018 12:39 PM 6 Days Post-Op  Subjective:  No new complaints   Vitals:   10/09/18 0012 10/09/18 0942  BP: 118/71 (!) 141/74  Pulse: 82 85  Resp: 16 11  Temp: 98.5 F (36.9 C) 98.9 F (37.2 C)  SpO2: 100% 96%   Physical Exam: Lungs:  Non labored Incisions:  B groin incisions stable; no active bleeding R groin Extremities:  DP signals brisk bilaterally; contracted LLE Abdomen:  Soft Neurologic: A&O  CBC    Component Value Date/Time   WBC 8.0 10/09/2018 0327   RBC 2.34 (L) 10/09/2018 0327   HGB 7.0 (L) 10/09/2018 0327   HCT 21.3 (L) 10/09/2018 0327   PLT 235 10/09/2018 0327   MCV 91.0 10/09/2018 0327   MCH 29.9 10/09/2018 0327   MCHC 32.9 10/09/2018 0327   RDW 14.2 10/09/2018 0327   LYMPHSABS 1.7 10/03/2018 0917   MONOABS 0.8 10/03/2018 0917   EOSABS 0.1 10/03/2018 0917   BASOSABS 0.0 10/03/2018 0917    BMET    Component Value Date/Time   NA 138 10/09/2018 0807   K 3.5 10/09/2018 0807   CL 104 10/09/2018 0807   CO2 24 10/09/2018 0807   GLUCOSE 113 (H) 10/09/2018 0807   BUN 8 10/09/2018 0807   CREATININE 1.28 (H) 10/09/2018 0807   CREATININE 1.25 (H) 03/02/2018 1043   CALCIUM 8.4 (L) 10/09/2018 0807   GFRNONAA 44 (L) 10/09/2018 0807   GFRNONAA 45 (L) 03/02/2018 1043   GFRAA 50 (L) 10/09/2018 0807   GFRAA 52 (L) 03/02/2018 1043    INR    Component Value Date/Time   INR 1.43 07/14/2016 2038     Intake/Output Summary (Last 24 hours) at 10/09/2018 1239 Last data filed at 10/09/2018 0900 Gross per 24 hour  Intake 720 ml  Output 2200 ml  Net -1480 ml     Assessment/Plan:  68 y.o. female is s/p R to L fem fem bypass with thrombectomy LLE bypass 6 Days Post-Op   Perfusing BLE well Dry gauze to each groin incision; change q shift Continued to stress importance of working on keeping LLE straight CKD; creatinine baseline; recheck BMP tomorrow, likely d/c foley tomorrow ABL anemia: transfuse 2u RBCs; recheck CBC tomorrow CSW  on board for SNF placement   Dagoberto Ligas, PA-C [facility] [phone] 10/09/2018 12:39 PM  I have examined the patient, reviewed and agree with above.  Curt Jews, MD 10/09/2018 4:25 PM

## 2018-10-09 NOTE — Care Management Important Message (Signed)
Important Message  Patient Details  Name: Wendy Townsend MRN: [id number] Date of Birth: 1950-09-26   Medicare Important Message Given:  Yes    Carlinda Ohlson P [place] 10/09/2018, 1:20 PM

## 2018-10-10 LAB — TYPE AND SCREEN
ABO/RH(D): B POS
Antibody Screen: NEGATIVE
Unit division: 0
Unit division: 0

## 2018-10-10 LAB — BASIC METABOLIC PANEL
Anion gap: 10 (ref 5–15)
BUN: 7 mg/dL — ABNORMAL LOW (ref 8–23)
CO2: 23 mmol/L (ref 22–32)
Calcium: 8.5 mg/dL — ABNORMAL LOW (ref 8.9–10.3)
Chloride: 106 mmol/L (ref 98–111)
Creatinine, Ser: 1.15 mg/dL — ABNORMAL HIGH (ref 0.44–1.00)
GFR calc Af Amer: 57 mL/min — ABNORMAL LOW (ref >60)
GFR calc non Af Amer: 50 mL/min — ABNORMAL LOW (ref >60)
Glucose, Bld: 115 mg/dL — ABNORMAL HIGH (ref 70–99)
Potassium: 3.7 mmol/L (ref 3.5–5.1)
Sodium: 139 mmol/L (ref 135–145)

## 2018-10-10 LAB — CBC
HEMATOCRIT: 33.1 % — AB (ref 36.0–46.0)
Hemoglobin: 11.3 g/dL — ABNORMAL LOW (ref 12.0–15.0)
MCH: 31 pg (ref 26.0–34.0)
MCHC: 34.1 g/dL (ref 30.0–36.0)
MCV: 90.9 fL (ref 80.0–100.0)
Platelets: 253 10*3/uL (ref 150–400)
RBC: 3.64 MIL/uL — ABNORMAL LOW (ref 3.87–5.11)
RDW: 14.1 % (ref 11.5–15.5)
WBC: 9.8 10*3/uL (ref 4.0–10.5)
nRBC: 0 % (ref 0.0–0.2)

## 2018-10-10 LAB — BPAM RBC
Blood Product Expiration Date: 202001272359
Blood Product Expiration Date: 202002102359
ISSUE DATE / TIME: 202001211537
ISSUE DATE / TIME: 202001211827
UNIT TYPE AND RH: 7300
Unit Type and Rh: 7300

## 2018-10-10 LAB — GLUCOSE, CAPILLARY: Glucose-Capillary: 102 mg/dL — ABNORMAL HIGH (ref 70–99)

## 2018-10-10 NOTE — Progress Notes (Signed)
Physical Therapy Treatment Patient Details Name: Wendy Townsend MRN: [id number] DOB: 1950-11-10 Today's Date: 10/10/2018    History of Present Illness Pt is a 68 y.o. F who is s/p R to L fem fem bypass and thrombectomy of L fem-pop. PMH of stroke, tobacco abuse.    PT Comments    Pt making slow progress towards her physical therapy goals. Able to perform bed mobility and transfers with less assistance. However, she still is unable to weight bear through LLE to begin gait training. Continued reinforcement of LLE positioning and exercises for ROM/strengthening provided.    Follow Up Recommendations  SNF;Supervision/Assistance - 24 hour     Equipment Recommendations  3in1 (PT);Wheelchair (measurements PT)    Recommendations for Other Services       Precautions / Restrictions Precautions Precautions: Fall Precaution Comments: maintains L LE in flexed position unless cued Restrictions Weight Bearing Restrictions: No    Mobility  Bed Mobility Overal bed mobility: Needs Assistance Bed Mobility: Supine to Sit     Supine to sit: Min guard     General bed mobility comments: Pt utilizing somewhat of a "helicopter" method to spin her hips around to edge of bed. Able to complete without physical assistance  Transfers Overall transfer level: Needs assistance Equipment used: Rolling walker (2 wheeled) Transfers: Sit to/from [facility] Sit to Stand: Min assist Stand pivot transfers: Min assist       General transfer comment: Min assist to stand and pivot from bed to chair. Unable to bear weight through LLE or hop on RLE. Cues for sequencing  Ambulation/Gait                 Stairs             Wheelchair Mobility    Modified Rankin (Stroke Patients Only)       Balance Overall balance assessment: Needs assistance Sitting-balance support: Feet supported;Bilateral upper extremity supported Sitting balance-Leahy Scale: Good     Standing  balance support: Bilateral upper extremity supported;During functional activity Standing balance-Leahy Scale: Fair                              Cognition Arousal/Alertness: Awake/alert Behavior During Therapy: WFL for tasks assessed/performed Overall Cognitive Status: Impaired/Different from baseline Area of Impairment: Memory                     Memory: Decreased short-term memory   Safety/Judgement: Decreased awareness of deficits     General Comments: poor insight and follow through with L LE exercises/positioning      Exercises General Exercises - Lower Extremity Quad Sets: Both;10 reps;Supine Short Arc Quad: Left;AAROM;10 reps;Supine    General Comments        Pertinent Vitals/Pain Pain Assessment: Faces Faces Pain Scale: Hurts little more Pain Location: L LE Pain Descriptors / Indicators: Operative site guarding;Grimacing Pain Intervention(s): Monitored during session    Home Living                      Prior Function            PT Goals (current goals can now be found in the care plan section) Acute Rehab PT Goals Patient Stated Goal: "I want to try to do more today." Potential to Achieve Goals: Good Progress towards PT goals: Progressing toward goals    Frequency    Min 3X/week  PT Plan Current plan remains appropriate    Co-evaluation              AM-PAC PT "6 Clicks" Mobility   Outcome Measure  Help needed turning from your back to your side while in a flat bed without using bedrails?: None Help needed moving from lying on your back to sitting on the side of a flat bed without using bedrails?: A Little Help needed moving to and from a bed to a chair (including a wheelchair)?: A Little Help needed standing up from a chair using your arms (e.g., wheelchair or bedside chair)?: A Little Help needed to walk in hospital room?: A Lot Help needed climbing 3-5 steps with a railing? : Total 6 Click Score:  16    End of Session Equipment Utilized During Treatment: Gait belt Activity Tolerance: Patient tolerated treatment well Patient left: with call bell/phone within reach;with chair alarm set;in chair Nurse Communication: Mobility status PT Visit Diagnosis: Pain;Difficulty in walking, not elsewhere classified (R26.2) Pain - Right/Left: Left Pain - part of body: Leg     Time: 1327-1350 PT Time Calculation (min) (ACUTE ONLY): 23 min  Charges:  $Gait Training: 8-22 mins $Therapeutic Activity: 8-22 mins                     Ellamae Sia, PT, DPT Acute Rehabilitation Services Pager [phone] Office [phone]   [name] 10/10/2018, 4:43 PM

## 2018-10-10 NOTE — Progress Notes (Addendum)
  Progress Note    10/10/2018 7:42 AM 7 Days Post-Op  Subjective: no new complaints   Vitals:   10/09/18 2223 10/09/18 2331  BP: 133/69 120/64  Pulse: 75 74  Resp: 17 15  Temp: 98.8 F (37.1 C) 98.5 F (36.9 C)  SpO2: 100% 96%   Physical Exam: Lungs:  Non labored Incisions:  R groin incision, soft, dry, no further bleeding; L groin incision c/d/i Extremities:  DP signals brisk by doppler Abdomen:  Soft Neurologic: A&O  CBC    Component Value Date/Time   WBC 8.0 10/09/2018 0327   RBC 2.34 (L) 10/09/2018 0327   HGB 7.0 (L) 10/09/2018 0327   HCT 21.3 (L) 10/09/2018 0327   PLT 235 10/09/2018 0327   MCV 91.0 10/09/2018 0327   MCH 29.9 10/09/2018 0327   MCHC 32.9 10/09/2018 0327   RDW 14.2 10/09/2018 0327   LYMPHSABS 1.7 10/03/2018 0917   MONOABS 0.8 10/03/2018 0917   EOSABS 0.1 10/03/2018 0917   BASOSABS 0.0 10/03/2018 0917    BMET    Component Value Date/Time   NA 138 10/09/2018 0807   K 3.5 10/09/2018 0807   CL 104 10/09/2018 0807   CO2 24 10/09/2018 0807   GLUCOSE 113 (H) 10/09/2018 0807   BUN 8 10/09/2018 0807   CREATININE 1.28 (H) 10/09/2018 0807   CREATININE 1.25 (H) 03/02/2018 1043   CALCIUM 8.4 (L) 10/09/2018 0807   GFRNONAA 44 (L) 10/09/2018 0807   GFRNONAA 45 (L) 03/02/2018 1043   GFRAA 50 (L) 10/09/2018 0807   GFRAA 52 (L) 03/02/2018 1043    INR    Component Value Date/Time   INR 1.43 07/14/2016 2038     Intake/Output Summary (Last 24 hours) at 10/10/2018 4196 Last data filed at 10/09/2018 2350 Gross per 24 hour  Intake 1725.33 ml  Output 2550 ml  Net -824.67 ml     Assessment/Plan:  68 y.o. female is s/p R to L fem fem bypass with thrombectomy LLE bypass 7 Days Post-Op   Perfusing BLE well with strong DP signals by doppler CKD: BMP pending ABL anemia: transfused 2u RBCs yesterday, CBC pending Dispo: pending SNF availability and insurance [place], [place] [facility] [phone] 10/10/2018 7:42 AM   I have examined the patient, reviewed and agree with above.  Progressing with physical therapy.  Repeat CBC pending.  Acute postop blood loss anemia  Curt Jews, MD 10/10/2018 8:23 AM

## 2018-10-11 ENCOUNTER — Telehealth: Payer: Self-pay | Admitting: Vascular Surgery

## 2018-10-11 DIAGNOSIS — I96 Gangrene, not elsewhere classified: Secondary | ICD-10-CM | POA: Diagnosis not present

## 2018-10-11 DIAGNOSIS — I10 Essential (primary) hypertension: Secondary | ICD-10-CM | POA: Diagnosis not present

## 2018-10-11 DIAGNOSIS — Z743 Need for continuous supervision: Secondary | ICD-10-CM | POA: Diagnosis not present

## 2018-10-11 DIAGNOSIS — R5381 Other malaise: Secondary | ICD-10-CM | POA: Diagnosis not present

## 2018-10-11 DIAGNOSIS — M62262 Nontraumatic ischemic infarction of muscle, left lower leg: Secondary | ICD-10-CM | POA: Diagnosis not present

## 2018-10-11 DIAGNOSIS — M6281 Muscle weakness (generalized): Secondary | ICD-10-CM | POA: Diagnosis not present

## 2018-10-11 DIAGNOSIS — G894 Chronic pain syndrome: Secondary | ICD-10-CM | POA: Diagnosis not present

## 2018-10-11 DIAGNOSIS — R279 Unspecified lack of coordination: Secondary | ICD-10-CM | POA: Diagnosis not present

## 2018-10-11 DIAGNOSIS — R262 Difficulty in walking, not elsewhere classified: Secondary | ICD-10-CM | POA: Diagnosis not present

## 2018-10-11 LAB — BASIC METABOLIC PANEL
Anion gap: 10 (ref 5–15)
BUN: 9 mg/dL (ref 8–23)
CO2: 23 mmol/L (ref 22–32)
Calcium: 8.1 mg/dL — ABNORMAL LOW (ref 8.9–10.3)
Chloride: 105 mmol/L (ref 98–111)
Creatinine, Ser: 1.23 mg/dL — ABNORMAL HIGH (ref 0.44–1.00)
GFR calc Af Amer: 53 mL/min — ABNORMAL LOW (ref >60)
GFR, EST NON AFRICAN AMERICAN: 46 mL/min — AB (ref >60)
Glucose, Bld: 105 mg/dL — ABNORMAL HIGH (ref 70–99)
Potassium: 3.6 mmol/L (ref 3.5–5.1)
Sodium: 138 mmol/L (ref 135–145)

## 2018-10-11 LAB — CBC
HCT: 31.5 % — ABNORMAL LOW (ref 36.0–46.0)
Hemoglobin: 10.6 g/dL — ABNORMAL LOW (ref 12.0–15.0)
MCH: 30.5 pg (ref 26.0–34.0)
MCHC: 33.7 g/dL (ref 30.0–36.0)
MCV: 90.5 fL (ref 80.0–100.0)
NRBC: 0 % (ref 0.0–0.2)
Platelets: 266 10*3/uL (ref 150–400)
RBC: 3.48 MIL/uL — AB (ref 3.87–5.11)
RDW: 13.8 % (ref 11.5–15.5)
WBC: 8.7 10*3/uL (ref 4.0–10.5)

## 2018-10-11 MED ORDER — OXYCODONE HCL 5 MG PO TABS: 5.0000 mg | ORAL_TABLET | Freq: Four times a day (QID) | ORAL | 0 refills | Status: DC | PRN

## 2018-10-11 NOTE — Progress Notes (Addendum)
  Progress Note    10/11/2018 7:26 AM 8 Days Post-Op  Subjective:  No complaints.  Anticipating discharge to SNF soon.   Vitals:   10/10/18 2344 10/11/18 0421  BP: (!) 143/71 129/69  Pulse: 78 75  Resp: 15 18  Temp: 99 F (37.2 C) 99.2 F (37.3 C)  SpO2: 100% 98%   Physical Exam: Lungs:  Non labored Incisions:  B groin incisions c/d/i; L popliteal incision c/d/i Extremities:  Faintly palpable L DP; R foot warm to touch Abdomen:  Soft Neurologic: A&O  CBC    Component Value Date/Time   WBC 8.7 10/11/2018 0345   RBC 3.48 (L) 10/11/2018 0345   HGB 10.6 (L) 10/11/2018 0345   HCT 31.5 (L) 10/11/2018 0345   PLT 266 10/11/2018 0345   MCV 90.5 10/11/2018 0345   MCH 30.5 10/11/2018 0345   MCHC 33.7 10/11/2018 0345   RDW 13.8 10/11/2018 0345   LYMPHSABS 1.7 10/03/2018 0917   MONOABS 0.8 10/03/2018 0917   EOSABS 0.1 10/03/2018 0917   BASOSABS 0.0 10/03/2018 0917    BMET    Component Value Date/Time   NA 138 10/11/2018 0345   K 3.6 10/11/2018 0345   CL 105 10/11/2018 0345   CO2 23 10/11/2018 0345   GLUCOSE 105 (H) 10/11/2018 0345   BUN 9 10/11/2018 0345   CREATININE 1.23 (H) 10/11/2018 0345   CREATININE 1.25 (H) 03/02/2018 1043   CALCIUM 8.1 (L) 10/11/2018 0345   GFRNONAA 46 (L) 10/11/2018 0345   GFRNONAA 45 (L) 03/02/2018 1043   GFRAA 53 (L) 10/11/2018 0345   GFRAA 52 (L) 03/02/2018 1043    INR    Component Value Date/Time   INR 1.43 07/14/2016 2038     Intake/Output Summary (Last 24 hours) at 10/11/2018 0726 Last data filed at 10/11/2018 0158 Gross per 24 hour  Intake -  Output 1675 ml  Net -1675 ml     Assessment/Plan:  68 y.o. female is s/p R to L fem fem bypass with thrombectomy LLE bypass 8 Days Post-Op   Perfusing BLE well CKD: creatinine stable ABL anemia: no sign of bleeding on exam; no bloody BM; monitor H&H D/c to SNF when approved   Dagoberto Ligas, PA-C [facility] [phone] 10/11/2018 7:26 AM  I have  examined the patient, reviewed and agree with above.  Doing better with mobilization.  Medically ready for transfer to skilled nursing facility when bed available  Curt Jews, MD 10/11/2018 10:47 AM

## 2018-10-11 NOTE — Progress Notes (Signed)
PTAR has arrived for the patient, report attempted x2.

## 2018-10-11 NOTE — Discharge Summary (Signed)
Physician Discharge Summary   Patient ID: Wendy Townsend [id number] 67 y.o. 1951-08-12  Admit date: 10/03/2018  Discharge date and time: 10/11/18   Admitting Physician: Rosetta Posner, MD   Discharge Physician: same  Admission Diagnoses: Nontraumatic ischemic infarction of muscle of left lower leg [M62.262] Gangrene of toe of left foot ([place]) [I96]  Discharge Diagnoses: same  Admission Condition: poor  Discharged Condition: fair  Indication for Admission: ischemic LLE  Hospital Course: Wendy Townsend is a 68 year old female who presented to the emergency department with acute limb ischemia of left lower extremity on 10/03/2018.  She has history of an aortobifemoral bypass and was found to have an occluded left limb as well as an occluded left lower extremity bypass.  She was taken emergently to the operating room by Dr. early on 10/03/2018 and underwent right to left femoral to femoral bypass with left lower extremity bypass thrombectomy.  She tolerated the procedure well and was admitted to the hospital postoperatively.  Early postoperative days involved pain management of hyperemic left foot.  She also experienced change in renal function with increased creatinine and low urine output.  After adjustment of blood pressure medication as well as gentle hydration creatinine eventually returned to baseline and Foley was able to be removed.  I should also be mentioned that left lower extremity is contracted and patient has been encouraged daily to straighten her left leg.  She has refused to wear a long leg splint.  After evaluation and treatment with PT and OT, skilled nursing facility was recommended.  It should also be noted that she experienced a slow drift of hemoglobin and eventually required 2 units of packed red blood cells due to a hemoglobin reading of 7.0.  She is now maintaining her hemoglobin at time of discharge.  Clinical social work arranged placement in a skilled nursing facility and  she has been accepted with insurance authorization today.  She will follow-up with Dr. early in 2 weeks.  She will be discharged with 2 to 3 days of narcotic pain medication for continued postoperative pain control.  She will need to continue her aspirin and statin.  Discharge instructions were reviewed with the patient and she voices her understanding.  She will be discharged this afternoon to a skilled nursing facility in stable condition.  Consults: None  Treatments: surgery: Right to left femoral to femoral bypass with thrombectomy of left lower extremity bypass by Dr. Donnetta Hutching on 10/03/2018  Discharge Exam: See progress note 10/11/2018 Vitals:   10/10/18 2344 10/11/18 0421  BP: (!) 143/71 129/69  Pulse: 78 75  Resp: 15 18  Temp: 99 F (37.2 C) 99.2 F (37.3 C)  SpO2: 100% 98%     Disposition: Discharge disposition: 03-Skilled [place] [facility] use ---  Post-op:  Wound infection: No  Graft infection: No  Transfusion: Yes  If yes, 2 units given New Arrhythmia: No  D/C Ambulatory Status: Ambulatory with Assistance  Complications: MI: [x ] No, [ ]  Troponin only, [ ]  EKG or Clinical CHF: No Resp failure: [x ] none, [ ]  Pneumonia, [ ]  Ventilator Chg in renal function: [ ]  none, [x ] Inc. Cr > 0.5, [ ]  Temp. Dialysis, [ ]  Permanent dialysis Stroke: [ x] None, [ ]  Minor, [ ]  Major Return to OR: No  Reason for return to OR: [ ]  Bleeding, [ ]  Infection, [ ]  Thrombosis, [ ]  Revision  Discharge medications: Statin use:  Yes  ASA use:  Yes Plavix use:  No  for medical reason Not indicated Beta blocker use: Yes Coumadin use: No  for medical reason Not indicated    Patient Instructions:  Allergies as of 10/11/2018      Reactions   Penicillins Swelling   Has patient had a PCN reaction causing immediate rash, facial/tongue/throat swelling, SOB or lightheadedness with hypotension: YES Has patient had a PCN reaction causing severe rash involving mucus  membranes or skin necrosis: NO Has patient had a PCN reaction that required hospitalization NO Has patient had a PCN reaction occurring within the last 10 years: NO If all of the above answers are "NO", then may proceed with Cephalosporin use.      Medication List    TAKE these medications   aspirin 81 MG EC tablet Take 1 tablet (81 mg total) by mouth daily. Swallow whole.   atorvastatin 80 MG tablet Commonly known as:  LIPITOR Take 1 tablet (80 mg total) by mouth daily.   carvedilol 3.125 MG tablet Commonly known as:  COREG TAKE 1 TABLET (3.125 MG TOTAL) BY MOUTH 2 (TWO) TIMES DAILY WITH A MEAL.   cloNIDine 0.2 MG tablet Commonly known as:  CATAPRES Take 1 tablet (0.2 mg total) by mouth 2 (two) times daily.   hydrochlorothiazide 25 MG tablet Commonly known as:  HYDRODIURIL TAKE 1 TABLET BY MOUTH EVERY DAY What changed:  when to take this   lisinopril 40 MG tablet Commonly known as:  PRINIVIL,ZESTRIL Take 1 tablet (40 mg total) by mouth daily.   oxyCODONE 5 MG immediate release tablet Commonly known as:  Oxy IR/ROXICODONE Take 1 tablet (5 mg total) by mouth every 6 (six) hours as needed for moderate pain.   PEG-KCl-NaCl-NaSulf-Na Asc-C 140 g Solr Commonly known as:  PLENVU Take 1 kit by mouth as directed.            Durable Medical Equipment  (From admission, onward)         Start     Ordered   10/05/18 1208  For home use only DME 3 n 1  Once     10/05/18 1208   10/05/18 1208  For home use only DME lightweight manual wheelchair with seat cushion  Once    Comments:  Patient suffers from decreased ROM and s/p right to left femoral to femoral bypass grafting which impairs their ability to perform daily activities like bathing, dressing and feeding in the home.  A walker will not resolve  issue with performing activities of daily living. A wheelchair will allow patient to safely perform daily activities. Patient is not able to propel themselves in the home using a  standard weight wheelchair due to general weakness. Patient can self propel in the lightweight wheelchair.  Accessories: elevating leg rests (ELRs), wheel locks, extensions and anti-tippers.   10/05/18 1208         Activity: activity as tolerated Diet: regular diet Wound Care: keep wound clean and dry  Follow-up with Dr. Donnetta Hutching in 2 weeks.  SignedDagoberto Ligas 10/11/2018 11:40 AM

## 2018-10-11 NOTE — Discharge Instructions (Signed)
 [facility] [place]  Discharge instructions  Lower Extremity Bypass Surgery  Please refer to the following instruction for your post-procedure care. Your surgeon or physician assistant will discuss any changes with you.  Activity  You are encouraged to walk as much as you can. You can slowly return to normal activities during the month after your surgery. Avoid strenuous activity and heavy lifting until your doctor tells you it's OK. Avoid activities such as vacuuming or swinging a golf club. Do not drive until your doctor give the OK and you are no longer taking prescription pain medications. It is also normal to have difficulty with sleep habits, eating and bowel movement after surgery. These will go away with time.  Bathing/Showering  You may shower after you go home. Do not soak in a bathtub, hot tub, or swim until the incision heals completely.  Incision Care  Clean your incision with mild soap and water. Shower every day. Pat the area dry with a clean towel. You do not need a bandage unless otherwise instructed. Do not apply any ointments or creams to your incision. If you have open wounds you will be instructed how to care for them or a visiting nurse may be arranged for you. If you have staples or sutures along your incision they will be removed at your post-op appointment. You may have skin glue on your incision. Do not peel it off. It will come off on its own in about one week. If you have a great deal of moisture in your groin, use a gauze help keep this area dry.  Diet  Resume your normal diet. There are no special food restrictions following this procedure. A low fat/ low cholesterol diet is recommended for all patients with vascular disease. In order to heal from your surgery, it is CRITICAL to get adequate nutrition. Your body requires vitamins, minerals, and protein. Vegetables are the best source of vitamins and minerals. Vegetables also provide the  perfect balance of protein. Processed food has little nutritional value, so try to avoid this.  Medications  Resume taking all your medications unless your doctor or nurse practitioner tells you not to. If your incision is causing pain, you may take over-the-counter pain relievers such as acetaminophen (Tylenol). If you were prescribed a stronger pain medication, please aware these medication can cause nausea and constipation. Prevent nausea by taking the medication with a snack or meal. Avoid constipation by drinking plenty of fluids and eating foods with high amount of fiber, such as fruits, vegetables, and grains. Take Colase 100 mg (an over-the-counter stool softener) twice a day as needed for constipation. Do not take Tylenol if you are taking prescription pain medications.  Follow Up  Our office will schedule a follow up appointment 2-3 weeks following discharge.  Please call us immediately for any of the following conditions  Severe or worsening pain in your legs or feet while at rest or while walking Increase pain, redness, warmth, or drainage (pus) from your incision site(s) Fever of 101 degree or higher The swelling in your leg with the bypass suddenly worsens and becomes more painful than when you were in the hospital If you have been instructed to feel your graft pulse then you should do so every day. If you can no longer feel this pulse, call the office immediately. Not all patients are given this instruction.  Leg swelling is common after leg bypass surgery.  The swelling should improve over a few months   following surgery. To improve the swelling, you may elevate your legs above the level of your heart while you are sitting or resting. Your surgeon or physician assistant may ask you to apply an ACE wrap or wear compression (TED) stockings to help to reduce swelling.  Reduce your risk of vascular disease  Stop smoking. If you would like help call [facility] at 1-800-QUIT-NOW  (1-800-784-8669) or [place] at 336-586-4000.  Manage your cholesterol Maintain a desired weight Control your diabetes weight Control your diabetes Keep your blood pressure down  If you have any questions, please call the office at [phone]   

## 2018-10-11 NOTE — Telephone Encounter (Signed)
sch appt vm NA mld ltr 10/23/2018 130pm p/o MD

## 2018-10-11 NOTE — Telephone Encounter (Signed)
-----   Message from Dagoberto Ligas, PA-C sent at 10/11/2018 11:36 AM EST -----  Can you schedule an appt for this pt with Dr. Donnetta Hutching in 2 weeks.  PO fem fem with LLE thrombectomy. Thanks, [facility]

## 2018-10-11 NOTE — Care Management Note (Signed)
Case Management Note Wendy Gibbons RN, BSN Transitions of Care Unit 4E- RN Case Manager [phone]  Patient Details  Name: Wendy Townsend MRN: [id number] Date of Birth: [birth year]  Subjective/Objective:   Pt admitted s/p R to L fem-fem and thrombectomy of L fem-pop                  Action/Plan: PTA pt lived at home with brother and cousin, order placed for HHPT, CM spoke with pt at bedside for transition of care needs- per pt she states her brother will be at home to assist her along with cousin and niece. She reports niece will transport home. Pt states she has a rollator at home, would like a 3n1 and w/c for home. Orders have been placed for these- choice offered for [facility] and DME needs- list provided to pt per CMS website with star ratings (copy placed in shadow chart)- pt states she has used [facility] in past and wants to use them again- call made to Butch Penny with [facility] for [facility] referral- referral has been accepted- spoke with [facility] with [facility] for DME needs- W/C and 3n1 to be delivered to room prior to discharge.   Expected Discharge Date:  10/11/18               Expected Discharge Plan:  [place]  In-House Referral:  Clinical Social Work  Discharge planning Services  CM Consult  Post Acute Care Choice:  Durable Medical Equipment, Home Health Choice offered to:  Patient  DME Arranged:  3-N-1, Programmer, multimedia DME Agency:  [place]:  PT [place]:  [place]  Status of Service:  Completed, signed off  If discussed at [place] of Stay Meetings, dates discussed:    Discharge Disposition:  Skilled facility  Additional Comments:  10/11/18- 1200- Wendy Gibbons RN, CM- pt has received insurance approval for STSNF per CSW- to transition to [facility] today.   10/08/18- 1000- Dondre Catalfamo RN, CM- pt has not progressed well with mobility- recommendations now for SNF- CSW to consult for STSNF placement. CM has notified [facility] of  change in transition plan to SNF.   Dawayne Patricia, RN 10/11/2018, 12:12 PM

## 2018-10-11 NOTE — Progress Notes (Signed)
Patient will DC to: [facility] Date:10/11/2018 Family Notified:left voice message with the patient's niece, [name]. Informed the patient left voice message with family member.  Transport By: Corey Harold  RN, patient, and facility notified of DC. Discharge Summary sent to facility. RN given number for [phone] Room 144. DC packet on chart. Ambulance transport requested for patient.   Clinical Social Worker signing off. Thurmond Butts, [place] Social Worker [phone]

## 2018-10-11 NOTE — Clinical Social Work Placement (Signed)
   CLINICAL SOCIAL WORK PLACEMENT  NOTE  Date:  10/11/2018  Patient Details  Name: Wendy Townsend MRN: [id number] Date of Birth: 04-29-1951  Clinical Social Work is seeking post-discharge placement for this patient at the [place] level of care (*CSW will initial, date and re-position this form in  chart as items are completed):  Yes   Patient/family provided with [place] Work Department's list of facilities offering this level of care within the geographic area requested by the patient (or if unable, by the patient's family).  Yes   Patient/family informed of their freedom to choose among providers that offer the needed level of care, that participate in Medicare, Medicaid or managed care program needed by the patient, have an available bed and are willing to accept the patient.  Yes   Patient/family informed of [place]'s ownership interest in [facility] and [facility], as well as of the fact that they are under no obligation to receive care at these facilities.  PASRR submitted to EDS on       PASRR number received on 10/05/18     Existing PASRR number confirmed on       FL2 transmitted to all facilities in geographic area requested by pt/family on       FL2 transmitted to all facilities within larger geographic area on       Patient informed that his/her managed care company has contracts with or will negotiate with certain facilities, including the following:        Yes   Patient/family informed of bed offers received.  Patient chooses bed at [facility] )     Physician recommends and patient chooses bed at      Patient to be transferred to [facility] on  .  Patient to be transferred to facility by [facility]     Patient family notified on 10/11/18 of transfer.  Name of family member notified:  called left message with patient's [name]. CSW advised the patient left message  with niece.      PHYSICIAN       Additional Comment:    _______________________________________________ Vinie Sill, LCSWA 10/11/2018, 3:48 PM

## 2018-10-16 DIAGNOSIS — G894 Chronic pain syndrome: Secondary | ICD-10-CM | POA: Diagnosis not present

## 2018-10-16 DIAGNOSIS — I10 Essential (primary) hypertension: Secondary | ICD-10-CM | POA: Diagnosis not present

## 2018-10-16 DIAGNOSIS — I96 Gangrene, not elsewhere classified: Secondary | ICD-10-CM | POA: Diagnosis not present

## 2018-10-16 DIAGNOSIS — M62262 Nontraumatic ischemic infarction of muscle, left lower leg: Secondary | ICD-10-CM | POA: Diagnosis not present

## 2018-10-18 DIAGNOSIS — I10 Essential (primary) hypertension: Secondary | ICD-10-CM | POA: Diagnosis not present

## 2018-10-18 DIAGNOSIS — M62262 Nontraumatic ischemic infarction of muscle, left lower leg: Secondary | ICD-10-CM | POA: Diagnosis not present

## 2018-10-18 DIAGNOSIS — I96 Gangrene, not elsewhere classified: Secondary | ICD-10-CM | POA: Diagnosis not present

## 2018-10-18 DIAGNOSIS — G894 Chronic pain syndrome: Secondary | ICD-10-CM | POA: Diagnosis not present

## 2018-10-23 ENCOUNTER — Encounter: Payer: Medicare HMO | Admitting: Vascular Surgery

## 2018-10-23 DIAGNOSIS — I96 Gangrene, not elsewhere classified: Secondary | ICD-10-CM | POA: Diagnosis not present

## 2018-10-23 DIAGNOSIS — I10 Essential (primary) hypertension: Secondary | ICD-10-CM | POA: Diagnosis not present

## 2018-10-23 DIAGNOSIS — M62262 Nontraumatic ischemic infarction of muscle, left lower leg: Secondary | ICD-10-CM | POA: Diagnosis not present

## 2018-10-30 ENCOUNTER — Ambulatory Visit (INDEPENDENT_AMBULATORY_CARE_PROVIDER_SITE_OTHER): Payer: Medicare HMO | Admitting: Vascular Surgery

## 2018-10-30 ENCOUNTER — Encounter: Payer: Self-pay | Admitting: Vascular Surgery

## 2018-10-30 VITALS — BP 127/79 | HR 68 | Temp 97.4°F | Resp 18 | Ht 64.0 in | Wt 160.5 lb

## 2018-10-30 DIAGNOSIS — I739 Peripheral vascular disease, unspecified: Secondary | ICD-10-CM

## 2018-10-30 NOTE — Progress Notes (Signed)
   Patient name: Wendy Townsend MRN: [id number] DOB: 01-Feb-1951 Sex: female  REASON FOR VISIT: Follow-up injury 10/03/2018  HPI: Wendy Townsend is a 68 y.o. female here today for follow-up.  She had initially undergone aortobifemoral bypass and left femoral to below-knee popliteal bypass with Gore-Tex with Dr. Donzetta Matters in October 2017.  She had presented to the hospital with profound ischemia of her left foot with occlusion of her left limb of her aortofemoral graft and her left femoropopliteal bypass.  She was taken emergently to the operating room where she underwent attempted thrombectomy of the left limb.  This was not possible.  She underwent right to left femorofemoral bypass and thrombectomy of her left femoral to popliteal bypass both through the groin and through the below-knee popliteal level.  She did well in the hospital and was discharged home.  He looks quite good today.  She reports mild soreness and is walking without difficulty  Current Outpatient Medications  Medication Sig Dispense Refill  . aspirin 81 MG EC tablet Take 1 tablet (81 mg total) by mouth daily. Swallow whole. 30 tablet 11  . atorvastatin (LIPITOR) 80 MG tablet Take 1 tablet (80 mg total) by mouth daily. 90 tablet 3  . carvedilol (COREG) 3.125 MG tablet TAKE 1 TABLET (3.125 MG TOTAL) BY MOUTH 2 (TWO) TIMES DAILY WITH A MEAL. 60 tablet 11  . cloNIDine (CATAPRES) 0.2 MG tablet Take 1 tablet (0.2 mg total) by mouth 2 (two) times daily. 60 tablet 11  . hydrochlorothiazide (HYDRODIURIL) 25 MG tablet TAKE 1 TABLET BY MOUTH EVERY DAY (Patient taking differently: Take 25 mg by mouth every other day. ) 30 tablet 11  . lisinopril (PRINIVIL,ZESTRIL) 40 MG tablet Take 1 tablet (40 mg total) by mouth daily. 90 tablet 3  . oxyCODONE (OXY IR/ROXICODONE) 5 MG immediate release tablet Take 1 tablet (5 mg total) by mouth every 6 (six) hours as needed for moderate pain. 20 tablet 0  . PEG-KCl-NaCl-NaSulf-Na  Asc-C (PLENVU) 140 g SOLR Take 1 kit by mouth as directed. 1 each 0   No current facility-administered medications for this visit.      PHYSICAL EXAM: Vitals:   10/30/18 0835  BP: 127/79  Pulse: 68  Resp: 18  Temp: (!) 97.4 F (36.3 C)  TempSrc: Oral  SpO2: 100%  Weight: 160 lb 8 oz (72.8 kg)  Height: _0  (1.626 m)    GENERAL: The patient is a well-nourished female, in no acute distress. The vital signs are documented above. She has an easily palpable femorofemoral graft pulse.  She does have some swelling in the left lower extremity.  She has peeling of the skin on her left foot with some separation of the eschars on the tips of her toes but no evidence of invasive infection.  She has excellent biphasic peroneal and dorsalis pedis signal with hand-held Doppler and monophasic posterior tibial signal  MEDICAL ISSUES: Stable overall with patent femorofemoral and femoropopliteal bypass.  We will continue her usual activities.  Will be seen again in 3 months with noninvasive studies.  I described symptoms of graft occlusion and asked her to notify us immediately should this occur   Rosetta Posner, MD [facility] Tel [phone] Pager [phone]

## 2018-10-31 DIAGNOSIS — Z48812 Encounter for surgical aftercare following surgery on the circulatory system: Secondary | ICD-10-CM | POA: Diagnosis not present

## 2018-10-31 DIAGNOSIS — G894 Chronic pain syndrome: Secondary | ICD-10-CM | POA: Diagnosis not present

## 2018-10-31 DIAGNOSIS — I70262 Atherosclerosis of native arteries of extremities with gangrene, left leg: Secondary | ICD-10-CM | POA: Diagnosis not present

## 2018-10-31 DIAGNOSIS — Z9181 History of falling: Secondary | ICD-10-CM | POA: Diagnosis not present

## 2018-10-31 DIAGNOSIS — Z7982 Long term (current) use of aspirin: Secondary | ICD-10-CM | POA: Diagnosis not present

## 2018-10-31 DIAGNOSIS — I10 Essential (primary) hypertension: Secondary | ICD-10-CM | POA: Diagnosis not present

## 2018-10-31 DIAGNOSIS — D649 Anemia, unspecified: Secondary | ICD-10-CM | POA: Diagnosis not present

## 2018-10-31 DIAGNOSIS — E782 Mixed hyperlipidemia: Secondary | ICD-10-CM | POA: Diagnosis not present

## 2018-11-02 ENCOUNTER — Encounter: Payer: Self-pay | Admitting: Family

## 2018-11-02 ENCOUNTER — Ambulatory Visit (INDEPENDENT_AMBULATORY_CARE_PROVIDER_SITE_OTHER): Payer: Medicare HMO | Admitting: Family

## 2018-11-02 VITALS — BP 120/80 | Temp 97.5°F | Ht 64.0 in | Wt 148.2 lb

## 2018-11-02 DIAGNOSIS — M79672 Pain in left foot: Secondary | ICD-10-CM

## 2018-11-02 DIAGNOSIS — I10 Essential (primary) hypertension: Secondary | ICD-10-CM | POA: Diagnosis not present

## 2018-11-02 DIAGNOSIS — Z23 Encounter for immunization: Secondary | ICD-10-CM

## 2018-11-02 DIAGNOSIS — I999 Unspecified disorder of circulatory system: Secondary | ICD-10-CM

## 2018-11-02 DIAGNOSIS — R739 Hyperglycemia, unspecified: Secondary | ICD-10-CM | POA: Diagnosis not present

## 2018-11-02 DIAGNOSIS — I739 Peripheral vascular disease, unspecified: Secondary | ICD-10-CM | POA: Diagnosis not present

## 2018-11-02 DIAGNOSIS — K5901 Slow transit constipation: Secondary | ICD-10-CM | POA: Diagnosis not present

## 2018-11-02 DIAGNOSIS — H6121 Impacted cerumen, right ear: Secondary | ICD-10-CM

## 2018-11-02 DIAGNOSIS — L853 Xerosis cutis: Secondary | ICD-10-CM | POA: Diagnosis not present

## 2018-11-02 MED ORDER — CARBAMIDE PEROXIDE 6.5 % OT SOLN: 5.0000 [drp] | Freq: Two times a day (BID) | OTIC | 0 refills | Status: DC

## 2018-11-02 MED ORDER — BISACODYL 5 MG PO TBEC: 5.0000 mg | DELAYED_RELEASE_TABLET | Freq: Once | ORAL | 0 refills | Status: AC

## 2018-11-02 MED ORDER — AQUAPHOR EX OINT: TOPICAL_OINTMENT | Freq: Two times a day (BID) | CUTANEOUS | 0 refills | Status: DC

## 2018-11-02 NOTE — Progress Notes (Signed)
Provider: Marlowe Sax FNP-C  Jeramey Lanuza, Nelda Bucks, NP  Patient Care Team: Aydin Cavalieri, Nelda Bucks, NP as PCP - General (Family Medicine) Nahser, Wonda Cheng, MD as PCP - Cardiology (Cardiology)  Extended Emergency Contact Information Primary Emergency Contact: Claudina Lick, [place] [address] Johnnette Litter of [place] Phone: [phone] Relation: Niece Secondary Emergency Contact: Silvestre Mesi Home Phone: [phone] Relation: Sister  Goals of care: Advanced Directive information Advanced Directives 10/03/2018  Does Patient Have a Medical Advance Directive? No  Type of Advance Directive -  Does patient want to make changes to medical advance directive? -  Copy of [place] in Chart? -  Would patient like information on creating a medical advance directive? No - Patient declined     Chief Complaint  Patient presents with  . Follow-up    Follow up on blood pressure and just released from rehab from surgery     HPI:  Pt is a 68 y.o. female seen today for an acute visit for follow up post Rehabilitation status post hospital admission 10/03/2018- 10/11/2018 for acute left lowe extremity limb ischemia.she underwent am emergent left femoral bypass on 10/03/2018.During her hospital admission her CR was high and had decreased urine output.Her blood pressure medication was adjusted and hydration CR returned to her baseline.Her Hgb was 7.0 and required 2 units PRBC.she was seen by Physical therapy and rehabilitation recommended.she was discharged to [facility] facility for rehabilitation.she was discharged on pain medication,ASA and Statin and to continue with outpatient follow up with Vascular and vein specialist Dr.Early Todd.she states completed two and a half weeks in rehab.she was discharged home still waiting for home health physical therapy.she has a home health Nurse for left foot 5 th toe wound care.she has a significant medical history of PVD status  post left femoral to below knee popliteal bypass with Gore-Tex with Dr.Cain in October,2017.    She was seen by Yolanda Manges  10/30/2018 patient to follow up in 3 months. She complains of no bowel movement for the past 4-5 days.she states was discharged on some medication for constipation but was too expensive but the pharmacist recommended Miralax.she has took one dose yesterday but has not had a bowel movement.she does mentions that her appetite is starting to pick up.    Hypertension- currently stable on lisinopril 40 mg tablet daily,Hydrochlorothiazide 25 mg tablet daily,clonidine 0.2 mg tablet twice daily and coreg 3.125 mg tablet twice daily.On ASA and Statin. She denies any headache,dizziness,shortness of breath ,chest pain or changes in vision.     Past Medical History:  Diagnosis Date  . Clotting disorder ([place])   . Hyperlipidemia   . Hypertension   . Peripheral vascular disease ([place])    vein surgery, left leg  . Stroke [facility]) 2002   ongoing mild loss of feeling in left side, had to learn to write right-handed  . Substance abuse ([place])   . Tobacco abuse    Past Surgical History:  Procedure Laterality Date  . AORTA - BILATERAL FEMORAL ARTERY BYPASS GRAFT N/A 07/14/2016   Procedure: AORTOBIFEMORAL BYPASS GRAFT;  Surgeon: Waynetta Sandy, MD;  Location: [facility];  Service: Vascular;  Laterality: N/A;  . AORTA - BILATERAL FEMORAL ARTERY BYPASS GRAFT Left 10/03/2018   Procedure: THROMBECTOMY OF LEFT LEG, LEFT FEMORAL-POPLITEAL BYPASS GRAFT, RIGHT TO LEFT FEMORAL-FEMORAL BYPASS GRAFT;  Surgeon: Rosetta Posner, MD;  Location: [facility];  Service: Vascular;  Laterality: Left;  . CARDIAC CATHETERIZATION  N/A 07/11/2016   Procedure: Left Heart Cath and Coronary Angiography;  Surgeon: Nelva Bush, MD;  Location: [facility];  Service: Cardiovascular;  Laterality: N/A;  . FEMORAL-POPLITEAL BYPASS GRAFT Left 07/14/2016   Procedure: REDO BYPASS GRAFT FEMORAL-POPLITEAL ARTERY;  Surgeon:  Waynetta Sandy, MD;  Location: [facility];  Service: Vascular;  Laterality: Left;  . PERIPHERAL VASCULAR CATHETERIZATION N/A 07/08/2016   Procedure: Abdominal Aortogram w/ Bilateral Lower Extremity Runoff;  Surgeon: Elam Dutch, MD;  Location: [facility];  Service: Cardiovascular;  Laterality: N/A;  . VEIN SURGERY Left     Allergies  Allergen Reactions  . Penicillins Swelling    Has patient had a PCN reaction causing immediate rash, facial/tongue/throat swelling, SOB or lightheadedness with hypotension: YES Has patient had a PCN reaction causing severe rash involving mucus membranes or skin necrosis: NO Has patient had a PCN reaction that required hospitalization NO Has patient had a PCN reaction occurring within the last 10 years: NO If all of the above answers are "NO", then may proceed with Cephalosporin use.    Outpatient Encounter Medications as of 11/02/2018  Medication Sig  . aspirin 81 MG EC tablet Take 1 tablet (81 mg total) by mouth daily. Swallow whole.  Marland Kitchen atorvastatin (LIPITOR) 80 MG tablet Take 1 tablet (80 mg total) by mouth daily.  . carvedilol (COREG) 3.125 MG tablet TAKE 1 TABLET (3.125 MG TOTAL) BY MOUTH 2 (TWO) TIMES DAILY WITH A MEAL.  . cloNIDine (CATAPRES) 0.2 MG tablet Take 1 tablet (0.2 mg total) by mouth 2 (two) times daily.  . hydrochlorothiazide (HYDRODIURIL) 25 MG tablet TAKE 1 TABLET BY MOUTH EVERY DAY  . lisinopril (PRINIVIL,ZESTRIL) 40 MG tablet Take 1 tablet (40 mg total) by mouth daily.  Marland Kitchen oxycodone (OXY-IR) 5 MG capsule Take 5 mg by mouth every 6 (six) hours as needed.  . [DISCONTINUED] oxyCODONE (OXY IR/ROXICODONE) 5 MG immediate release tablet Take 1 tablet (5 mg total) by mouth every 6 (six) hours as needed for moderate pain.  . [DISCONTINUED] PEG-KCl-NaCl-NaSulf-Na Asc-C (PLENVU) 140 g SOLR Take 1 kit by mouth as directed.   No facility-administered encounter medications on file as of 11/02/2018.     Review of Systems    Constitutional: Negative for appetite change, chills, fatigue and fever.  HENT: Negative for congestion, hearing loss, rhinorrhea, sinus pressure, sinus pain, sneezing, sore throat and trouble swallowing.   Eyes: Negative for discharge, redness, itching and visual disturbance.  Respiratory: Negative for cough, chest tightness, shortness of breath and wheezing.   Cardiovascular: Positive for leg swelling. Negative for chest pain and palpitations.  Gastrointestinal: Negative for abdominal distention, abdominal pain, constipation, diarrhea, nausea and vomiting.  Endocrine: Negative for cold intolerance, heat intolerance, polydipsia, polyphagia and polyuria.  Genitourinary: Negative for dysuria, flank pain, frequency and urgency.  Musculoskeletal: Positive for gait problem.  Skin: Negative for color change, pallor and rash.  Neurological: Negative for dizziness, light-headedness, numbness and headaches.  Hematological: Does not bruise/bleed easily.  Psychiatric/Behavioral: Negative for agitation and sleep disturbance. The patient is not nervous/anxious.     Immunization History  Administered Date(s) Administered  . Influenza,inj,Quad PF,6+ Mos 09/30/2016, 07/19/2017  . Pneumococcal Polysaccharide-23 04/04/2017   Pertinent  Health Maintenance Due  Topic Date Due  . DEXA SCAN  12/20/2016  . PNA vac Low Risk Adult (2 of 2 - PCV13) 04/04/2018  . INFLUENZA VACCINE  04/19/2018  . COLONOSCOPY  09/19/2028 (Originally 12/20/2001)  . MAMMOGRAM  11/02/2019   Fall Risk  11/02/2018 03/02/2018 10/11/2017 04/04/2017 09/30/2016  Falls in the past year? 0 No No No No  Number falls in past yr: 0 - - - -  Injury with Fall? 0 - - - -    Vitals:   11/02/18 4403  Weight: 148 lb 3.2 oz (67.2 kg)  Height: _0  (1.626 m)   Body mass index is 25.44 kg/m. Physical Exam Vitals signs reviewed.  Constitutional:      General: She is not in acute distress.    Appearance: She is overweight.  HENT:     Head:  Normocephalic.     Right Ear: There is impacted cerumen.     Left Ear: Tympanic membrane, ear canal and external ear normal. There is no impacted cerumen.     Nose: Nose normal. No congestion or rhinorrhea.     Mouth/Throat:     Mouth: Mucous membranes are dry.     Pharynx: Oropharynx is clear. No oropharyngeal exudate or posterior oropharyngeal erythema.  Eyes:     General: No scleral icterus.       Right eye: No discharge.        Left eye: No discharge.     Extraocular Movements: Extraocular movements intact.     Conjunctiva/sclera: Conjunctivae normal.     Pupils: Pupils are equal, round, and reactive to light.  Neck:     Musculoskeletal: Normal range of motion. No neck rigidity or muscular tenderness.     Vascular: No carotid bruit.  Cardiovascular:     Rate and Rhythm: Normal rate and regular rhythm.     Heart sounds: Murmur present. No friction rub. No gallop.      Comments: Bilateral decreased pedal pulses known PVD following up with vascular  Pulmonary:     Effort: Pulmonary effort is normal. No respiratory distress.     Breath sounds: Normal breath sounds. No wheezing, rhonchi or rales.  Chest:     Chest wall: No tenderness.  Abdominal:     General: Bowel sounds are normal. There is no distension.     Palpations: Abdomen is soft. There is no mass.     Tenderness: There is no abdominal tenderness. There is no right CVA tenderness, left CVA tenderness, guarding or rebound.  Musculoskeletal:        General: No tenderness.     Right lower leg: No edema.     Comments: Left leg 1-2 + pitting edema   Lymphadenopathy:     Cervical: No cervical adenopathy.  Skin:    General: Skin is warm and dry.     Coloration: Skin is not pale.     Findings: No erythema, lesion or rash.     Comments: Left foot 5 th toe wound red without any drainage.left great toe skin escar tip of toe skin separation and 2nd toe skin dark in color.None tender to touch and without signs of infections.Dry  peeling skin noted on shin area.Generalized skin dryness on both legs.   Neurological:     Mental Status: She is alert and oriented to person, place, and time.     Coordination: Coordination normal.     Gait: Gait abnormal.  Psychiatric:        Mood and Affect: Mood normal.        Behavior: Behavior normal.        Thought Content: Thought content normal.        Judgment: Judgment normal.     Labs reviewed: Recent Labs    10/09/18  4970 10/10/18 0837 10/11/18 0345  NA 138 139 138  K 3.5 3.7 3.6  CL 104 106 105  CO2 _0 GLUCOSE 113* 115* 105*  BUN 8 7* 9  CREATININE 1.28* 1.15* 1.23*  CALCIUM 8.4* 8.5* 8.1*   Recent Labs    03/02/18 1043  AST 15  ALT 9  BILITOT 0.7  PROT 7.4   Recent Labs    03/02/18 1043 10/03/18 0917  10/09/18 0327 10/10/18 0837 10/11/18 0345  WBC 7.1 7.6   < > 8.0 9.8 8.7  NEUTROABS 3,933 4.9  --   --   --   --   HGB 12.8 12.3   < > 7.0* 11.3* 10.6*  HCT 35.8 36.4   < > 21.3* 33.1* 31.5*  MCV 91.8 93.8   < > 91.0 90.9 90.5  PLT 197 179   < > 235 253 266   < > = values in this interval not displayed.   Lab Results  Component Value Date   TSH 1.45 03/02/2018   Lab Results  Component Value Date   HGBA1C 5.9 (H) 10/11/2017   Lab Results  Component Value Date   CHOL 128 03/02/2018   HDL 33 (L) 03/02/2018   LDLCALC 71 03/02/2018   TRIG 162 (H) 03/02/2018   CHOLHDL 3.9 03/02/2018    Significant Diagnostic Results in last 30 days:  Ct Angio Ao+bifem W & Or Wo Contrast  Result Date: 10/03/2018 CLINICAL DATA:  Second toe of the left foot is black and cold. Inability to find pedal pulses. EXAM: CT ANGIOGRAPHY OF ABDOMINAL AORTA WITH ILIOFEMORAL RUNOFF TECHNIQUE: Multidetector CT imaging of the abdomen, pelvis and lower extremities was performed using the standard protocol during bolus administration of intravenous contrast. Multiplanar CT image reconstructions and MIPs were obtained to evaluate the vascular anatomy. CONTRAST:  26m  ISOVUE-370 IOPAMIDOL (ISOVUE-370) INJECTION 76% COMPARISON:  None. FINDINGS: VASCULAR Aorta: Post aortobifemoral bypass grafting however there is still persistent opacification of the infra anastomotic native abdominal aorta which supplies the IMA, right common and external iliac arteries. While the proximal anastomosis of the bypass graft is widely patent, there is a minimal to moderate amount of slightly irregular eccentric mural thrombus involving the proximal aspect of the bypass graft, not resulting in hemodynamically significant stenosis. Unfortunately, there is complete occlusion of the left limb of the bypass graft. The right limb of the bypass graft appears patent through the distal anastomosis. Celiac: There is a minimal amount of noncalcified atherosclerotic plaque and tortuosity involving the origin proximal aspect of the celiac artery, not resulting in hemodynamically significant stenosis. SMA: There is a minimal amount of eccentric mixed calcified and noncalcified atherosclerotic plaque involving the distal aspect of the main trunk of the SMA (representative sagittal image 71, series 10), not resulting in hemodynamically significant stenosis. Conventional branching pattern. The distal tributaries of the SMA appear widely patent without discrete intraluminal filling defect to suggest distal embolism. Renals: Solitary bilaterally the bilateral renal arteries are widely patent without hemodynamically significant stenosis. No vessel irregularity to suggest FMD. IMA: The IMA remains patent from the patent infra anastomotic abdominal aorta. RIGHT Lower Extremity Inflow: As above, post aortobifemoral bypass grafting. The right limb of the bypass graft appears widely patent without significant intragraft thrombus. Outflow: There is focal eccentric noncalcified plaque involving the origin of the right deep femoral and superficial femoral arteries resulting in short segment severe (at least 75%) luminal  narrowing (image 132, series 6). There is eccentric slightly irregular  mixed calcified and noncalcified atherosclerotic plaque throughout the remainder of the right superficial femoral artery resulting in tandem areas of at least 50% luminal narrowing (representative images 171, 184, 196, 207 and 221, series 6). There is a minimal amount of eccentric mixed calcified and noncalcified atherosclerotic plaque involving the right above and below-knee popliteal arteries, not definitely resulting in hemodynamically significant stenosis. Runoff: 2 vessel runoff to the right lower leg and foot as the right posterior tibial artery occludes at the level of the mid calf (image 346, series 6). The right dorsalis pedis artery is patent to the level of the forefoot. LEFT Lower Extremity Inflow: As above, the left limb of the aorta bypass graft is occluded. Outflow: Both tandem femoral popliteal bypass grafts are occluded throughout their imaged courses. There is faint atretic reconstitution of the left deep femoral artery via pelvic arterial collaterals. Runoff: There is atretic reconstitution of the left tibioperoneal trunk via deep thigh collaterals (image 283, series 6). Single-vessel runoff to the left lower leg and foot via the peroneal artery as both the left posterior (image 326, series 6) and anterior tibial (image 343, series 6) arteries occluded at the level of the mid calf. The left peroneal artery supplies the dorsalis pedis artery which appears patent to the level of the midfoot. No discrete internal filling defect suggest distal embolism. Veins: The IVC and pelvic venous system appear patent on this arterial phase examination. Review of the MIP images confirms the above findings. _________________________________________________________ NON-VASCULAR Lower chest: Limited visualization of lower thorax demonstrates minimal dependent subpleural ground-glass atelectasis. No discrete focal airspace opacities. No pleural  effusion. Normal heart size. Coronary artery calcifications. Small amount of pericardial fluid, presumably physiologic. Hepatobiliary: Normal hepatic contour. No discrete hyperenhancing hepatic lesions. Normal appearance of the gallbladder given degree distention. No radiopaque gallstones. No intra extrahepatic bili duct dilatation. No ascites. Pancreas: Normal appearance of the pancreas. Spleen: Normal appearance of the spleen. Adrenals/Urinary Tract: There is symmetric enhancement of the bilateral kidneys. Note is made of 2 hypoattenuating nonenhancing right-sided renal cysts the largest of which within the superior pole the right kidney measures 1.5 cm in diameter (image 42, series 8). Additional subcentimeter bilateral hypoattenuating renal lesions are too small to adequately characterize though favored to represent additional renal cysts. No urine obstruction or perinephric stranding. There is mild thickening of the bilateral adrenal glands without discrete nodule. Normal appearance of the urinary bladder given degree distention. Stomach/Bowel: Colonic diverticulosis without evidence of superimposed acute diverticulitis. Normal appearance of the terminal ileum and appendix. No pneumoperitoneum, pneumatosis or portal venous gas. No made of a small hiatal hernia. Lymphatic: No bulky retroperitoneal, mesenteric, pelvic or inguinal lymphadenopathy. Reproductive: Myomatous uterus with dominant fibroid arising from the left side of the uterine body measuring approximately 5.4 x 5.0 cm (image 120, series 6). No definitive adnexal lesions. No free fluid the pelvic cul-de-sac. Other: Regional soft tissues appear normal. Musculoskeletal: No definite acute or aggressive osseous abnormalities. IMPRESSION: VASCULAR 1. Post aorto bi femoral bypass graft with complete occlusion of the left limb of the graft. 2. There is persistent opacification of the infrarenal native abdominal aorta which supplies the IMA, right common and  internal iliac arteries. 3.  Aortic Atherosclerosis (ICD10-I70.0). Right lower extremity vascular Impression: 1. Short segment severe (at least 75%) luminal narrowing involving the proximal aspects of the right deep and superficial femoral arteries. 2. Tandem areas of at least 50% luminal narrowing throughout the right superficial femoral artery. 3. The right above and below-knee popliteal  arteries are patent and of normal caliber without a hemodynamically significant stenosis. 4. Two vessel runoff to the right lower leg and foot via the anterior tibial and peroneal arteries. The right-sided dorsalis pedis artery is patent to the level of the forefoot. Left lower extremity vascular Impression: 1. The left limb of the aortobifemoral bypass graft is occluded. 2. Complete occlusion of both tandem femoropopliteal bypass grafts. 3. Atretic reconstitution of the tibioperoneal trunk via deep thigh collaterals. 4. Single-vessel runoff to the left lower leg and foot via the left peroneal artery. The left peroneal artery reconstitutes the dorsalis pedis artery which appears patent to the level of the midfoot. No discrete intraluminal filling defect to suggest distal embolism. NON-VASCULAR 1. Colonic diverticulosis without evidence of diverticulitis. 2. Myomatous uterus. Critical Value/emergent results were called by telephone at the time of interpretation on 10/03/2018 at 1:20 pm to Dr. Charlesetta Shanks , who verbally acknowledged these results. Electronically Signed   By: Sandi Mariscal M.D.   On: 10/03/2018 13:23   Vas [place] Burnard Bunting With/wo Tbi  Result Date: 10/04/2018 LOWER EXTREMITY DOPPLER STUDY Indications: Peripheral artery disease.  Performing Technologist: Abram Sander RVS  Examination Guidelines: A complete evaluation includes at minimum, Doppler waveform signals and systolic blood pressure reading at the level of bilateral brachial, anterior tibial, and posterior tibial arteries, when vessel segments are accessible.  Bilateral testing is considered an integral part of a complete examination. Photoelectric Plethysmograph (PPG) waveforms and toe systolic pressure readings are included as required and additional duplex testing as needed. Limited examinations for reoccurring indications may be performed as noted.  ABI Findings: +---------+------------------+-----+----------+--------+ Right    Rt Pressure (mmHg)IndexWaveform  Comment  +---------+------------------+-----+----------+--------+ Brachial 108                    triphasic          +---------+------------------+-----+----------+--------+ PTA      64                0.54 monophasic         +---------+------------------+-----+----------+--------+ DP       76                0.64 biphasic           +---------+------------------+-----+----------+--------+ Great Toe40                0.34                    +---------+------------------+-----+----------+--------+ +---------+------------------+-----+----------+-------+ Left     Lt Pressure (mmHg)IndexWaveform  Comment +---------+------------------+-----+----------+-------+ Brachial 118                    triphasic         +---------+------------------+-----+----------+-------+ PTA      49                0.42 monophasic        +---------+------------------+-----+----------+-------+ DP       76                0.64 monophasic        +---------+------------------+-----+----------+-------+ Great Toe17                0.14                   +---------+------------------+-----+----------+-------+ +-------+-----------+-----------+------------+------------+ ABI/TBIToday's ABIToday's TBIPrevious ABIPrevious TBI +-------+-----------+-----------+------------+------------+ Right  0.64       0.34                                +-------+-----------+-----------+------------+------------+  Left   0.64       0.14                                 +-------+-----------+-----------+------------+------------+  Summary: Right: Resting right ankle-brachial index indicates moderate right lower extremity arterial disease. Left: Resting left ankle-brachial index indicates moderate left lower extremity arterial disease.  *See table(s) above for measurements and observations.  Electronically signed by Servando Snare MD on 10/04/2018 at 3:32:41 PM.   Final     Assessment/Plan  1. Slow transit constipation No BM 3-4 days though her appetite has picked up.Has taken miralax x 1 dose.fleet enema /suppository recommended but prefers pills. - Bisacodyl 5 mg tablet take one by mouth x 1 dose may repeat x 1 dose if still no bowel movement results and Notify provider's office.  - continue on miralax as directed.  - increase fluid and vegetable intake in diet.   2. Impacted cerumen of right ear Right ear TM not visualized due to cerumen impaction. - instil Debrox 6.5% otic solution 5 drops twice daily x 4 days then follow up for ear lavage.   3. PAD (peripheral artery disease) (HCC) Diminished pedal pulse.LLE status post 5th toe amputation.wound clean no signs of infections.continue on ASA and /statin.continue to follow up with vascular specialist.   4. Dry skin dermatitis Generalized skin dryness on both legs.apply Aquaphor ointment twice daily for skin dryness.   5. Need for influenza vaccination - Flu vaccine HIGH DOSE PF (Fluzone High dose) administered by [facility]. - monitor administration site for any signs of infections.   6. Essential hypertension B/p stable.continue current medication.  - CBC with Differential/Platelet - CMP with eGFR([facility])  7. Ischemic pain of foot, left Afebrile.status post left 5 th toe amputation.Left foot 5 th toe wound red without any drainage.left great toe skin escar tip of toe skin separation and 2nd toe skin dark in color.None tender to touch and without signs of infections.continue to follow up with vascular.[facility] to  continue with wound care.  - CBC with Differential/Platelet  Family/ staff Communication: Reviewed plan of care with patient.   Labs/tests ordered:  - CBC with Differential/Platelet - CMP with eGFR([facility])  Nelda Bucks Mahoganie Basher, NP

## 2018-11-02 NOTE — Patient Instructions (Signed)
1.Debrox 6.5 % otic solution instil 5 drops into right ear twice daily x 4 days then follow up for ear lavage.  2. Aquaphor ointment apply to legs twice daily for skin dryness.

## 2018-11-05 DIAGNOSIS — I70262 Atherosclerosis of native arteries of extremities with gangrene, left leg: Secondary | ICD-10-CM | POA: Diagnosis not present

## 2018-11-05 DIAGNOSIS — G894 Chronic pain syndrome: Secondary | ICD-10-CM | POA: Diagnosis not present

## 2018-11-05 DIAGNOSIS — Z48812 Encounter for surgical aftercare following surgery on the circulatory system: Secondary | ICD-10-CM | POA: Diagnosis not present

## 2018-11-05 DIAGNOSIS — I10 Essential (primary) hypertension: Secondary | ICD-10-CM | POA: Diagnosis not present

## 2018-11-05 DIAGNOSIS — E782 Mixed hyperlipidemia: Secondary | ICD-10-CM | POA: Diagnosis not present

## 2018-11-05 DIAGNOSIS — D649 Anemia, unspecified: Secondary | ICD-10-CM | POA: Diagnosis not present

## 2018-11-05 DIAGNOSIS — Z7982 Long term (current) use of aspirin: Secondary | ICD-10-CM | POA: Diagnosis not present

## 2018-11-05 DIAGNOSIS — Z9181 History of falling: Secondary | ICD-10-CM | POA: Diagnosis not present

## 2018-11-06 ENCOUNTER — Ambulatory Visit (INDEPENDENT_AMBULATORY_CARE_PROVIDER_SITE_OTHER): Payer: Medicare HMO | Admitting: Family

## 2018-11-06 ENCOUNTER — Encounter: Payer: Self-pay | Admitting: Family

## 2018-11-06 VITALS — BP 130/80 | HR 75 | Temp 97.9°F | Ht 64.0 in | Wt 149.8 lb

## 2018-11-06 DIAGNOSIS — I999 Unspecified disorder of circulatory system: Secondary | ICD-10-CM | POA: Diagnosis not present

## 2018-11-06 DIAGNOSIS — Z95820 Peripheral vascular angioplasty status with implants and grafts: Secondary | ICD-10-CM | POA: Diagnosis not present

## 2018-11-06 DIAGNOSIS — H6121 Impacted cerumen, right ear: Secondary | ICD-10-CM | POA: Diagnosis not present

## 2018-11-06 DIAGNOSIS — I10 Essential (primary) hypertension: Secondary | ICD-10-CM | POA: Diagnosis not present

## 2018-11-06 DIAGNOSIS — M79672 Pain in left foot: Secondary | ICD-10-CM | POA: Diagnosis not present

## 2018-11-06 DIAGNOSIS — R739 Hyperglycemia, unspecified: Secondary | ICD-10-CM | POA: Diagnosis not present

## 2018-11-06 DIAGNOSIS — R531 Weakness: Secondary | ICD-10-CM | POA: Diagnosis not present

## 2018-11-06 NOTE — Progress Notes (Signed)
Provider: Marlowe Sax FNP-C  Raequan Vanschaick, Nelda Bucks, NP  Patient Care Team: Cecia Egge, Nelda Bucks, NP as PCP - General (Family Medicine) Nahser, Wonda Cheng, MD as PCP - Cardiology (Cardiology)  Extended Emergency Contact Information Primary Emergency Contact: Claudina Lick, [place] [address] [address] of [place] Phone: [phone] Relation: Niece Secondary Emergency Contact: Silvestre Mesi Home Phone: [phone] Relation: Sister   Goals of care: Advanced Directive information Advanced Directives 10/03/2018  Does Patient Have a Medical Advance Directive? No  Type of Advance Directive -  Does patient want to make changes to medical advance directive? -  Copy of [place] in Chart? -  Would patient like information on creating a medical advance directive? No - Patient declined     Chief Complaint  Patient presents with  . Acute Visit    Impacted right ear     HPI:  Pt is a 68 y.o. female seen today for an acute visit for evaluation of right ear cerumen impaction.she has been applying debrox 6.5 % otic solution.she denies any fever,chill or ear pain.   Past Medical History:  Diagnosis Date  . Clotting disorder ([place])   . Hyperlipidemia   . Hypertension   . Peripheral vascular disease ([place])    vein surgery, left leg  . Stroke [employer]) 2002   ongoing mild loss of feeling in left side, had to learn to write right-handed  . Substance abuse ([place])   . Tobacco abuse    Past Surgical History:  Procedure Laterality Date  . AORTA - BILATERAL FEMORAL ARTERY BYPASS GRAFT N/A 07/14/2016   Procedure: AORTOBIFEMORAL BYPASS GRAFT;  Surgeon: Waynetta Sandy, MD;  Location: [facility];  Service: Vascular;  Laterality: N/A;  . AORTA - BILATERAL FEMORAL ARTERY BYPASS GRAFT Left 10/03/2018   Procedure: THROMBECTOMY OF LEFT LEG, LEFT FEMORAL-POPLITEAL BYPASS GRAFT, RIGHT TO LEFT FEMORAL-FEMORAL BYPASS GRAFT;  Surgeon: Rosetta Posner, MD;  Location: [facility];   Service: Vascular;  Laterality: Left;  . CARDIAC CATHETERIZATION N/A 07/11/2016   Procedure: Left Heart Cath and Coronary Angiography;  Surgeon: Nelva Bush, MD;  Location: [facility];  Service: Cardiovascular;  Laterality: N/A;  . FEMORAL-POPLITEAL BYPASS GRAFT Left 07/14/2016   Procedure: REDO BYPASS GRAFT FEMORAL-POPLITEAL ARTERY;  Surgeon: Waynetta Sandy, MD;  Location: [facility];  Service: Vascular;  Laterality: Left;  . PERIPHERAL VASCULAR CATHETERIZATION N/A 07/08/2016   Procedure: Abdominal Aortogram w/ Bilateral Lower Extremity Runoff;  Surgeon: Elam Dutch, MD;  Location: [facility];  Service: Cardiovascular;  Laterality: N/A;  . VEIN SURGERY Left     Allergies  Allergen Reactions  . Penicillins Swelling    Has patient had a PCN reaction causing immediate rash, facial/tongue/throat swelling, SOB or lightheadedness with hypotension: YES Has patient had a PCN reaction causing severe rash involving mucus membranes or skin necrosis: NO Has patient had a PCN reaction that required hospitalization NO Has patient had a PCN reaction occurring within the last 10 years: NO If all of the above answers are "NO", then may proceed with Cephalosporin use.    Outpatient Encounter Medications as of 11/06/2018  Medication Sig  . aspirin 81 MG EC tablet Take 1 tablet (81 mg total) by mouth daily. Swallow whole.  Marland Kitchen atorvastatin (LIPITOR) 80 MG tablet Take 1 tablet (80 mg total) by mouth daily.  . carbamide peroxide (DEBROX) 6.5 % OTIC solution Place 5 drops into the right ear 2 (two)  times daily.  . carvedilol (COREG) 3.125 MG tablet TAKE 1 TABLET (3.125 MG TOTAL) BY MOUTH 2 (TWO) TIMES DAILY WITH A MEAL.  . cloNIDine (CATAPRES) 0.2 MG tablet Take 1 tablet (0.2 mg total) by mouth 2 (two) times daily.  . hydrochlorothiazide (HYDRODIURIL) 25 MG tablet TAKE 1 TABLET BY MOUTH EVERY DAY  . lisinopril (PRINIVIL,ZESTRIL) 40 MG tablet Take 1 tablet (40 mg total) by mouth  daily.  . mineral oil-hydrophilic petrolatum (AQUAPHOR) ointment Apply topically 2 (two) times daily. Apply to lower extremities  . oxyCODONE (OXY IR/ROXICODONE) 5 MG immediate release tablet Take 5 mg by mouth every 6 (six) hours as needed for moderate pain or severe pain.  . polyethylene glycol (MIRALAX / GLYCOLAX) packet Take 17 g by mouth daily.   No facility-administered encounter medications on file as of 11/06/2018.     Review of Systems  Constitutional: Negative for chills and fever.  HENT: Negative for congestion, ear discharge, ear pain, hearing loss, rhinorrhea, sinus pressure, sinus pain, sneezing, sore throat and tinnitus.   Eyes: Negative for pain, discharge, redness and itching.  Neurological: Negative for dizziness, light-headedness and headaches.    Immunization History  Administered Date(s) Administered  . Influenza, High Dose Seasonal PF 11/02/2018  . Influenza,inj,Quad PF,6+ Mos 09/30/2016, 07/19/2017  . Pneumococcal Polysaccharide-23 04/04/2017   Pertinent  Health Maintenance Due  Topic Date Due  . DEXA SCAN  12/20/2016  . PNA vac Low Risk Adult (2 of 2 - PCV13) 04/04/2018  . COLONOSCOPY  09/19/2028 (Originally 12/20/2001)  . MAMMOGRAM  11/02/2019  . INFLUENZA VACCINE  Completed   Fall Risk  11/06/2018 11/02/2018 03/02/2018 10/11/2017 04/04/2017  Falls in the past year? 0 0 No No No  Number falls in past yr: 0 0 - - -  Injury with Fall? 0 0 - - -    Vitals:   11/06/18 1026  BP: 130/80  Pulse: 75  Temp: 97.9 F (36.6 C)  TempSrc: Oral  Weight: 149 lb 12.8 oz (67.9 kg)  Height: 5\' 4"  (1.626 m)   Body mass index is 25.71 kg/m. Physical Exam Constitutional:      General: She is not in acute distress.    Appearance: She is normal weight.  HENT:     Head: Normocephalic.     Right Ear: There is impacted cerumen.     Left Ear: Tympanic membrane, ear canal and external ear normal. There is no impacted cerumen.     Ears:     Comments: Right ear cerumen  lavaged with warm water.Small amounts of cerumen also removed using curette instrument.Patient tolerated procedure well.TM clear.     Nose: No congestion or rhinorrhea.     Mouth/Throat:     Mouth: Mucous membranes are moist.     Pharynx: Oropharynx is clear. No oropharyngeal exudate or posterior oropharyngeal erythema.  Eyes:     General: No scleral icterus.       Right eye: No discharge.        Left eye: No discharge.     Conjunctiva/sclera: Conjunctivae normal.     Pupils: Pupils are equal, round, and reactive to light.  Neck:     Musculoskeletal: Normal range of motion. No neck rigidity or muscular tenderness.  Lymphadenopathy:     Cervical: No cervical adenopathy.  Skin:    General: Skin is warm and dry.     Coloration: Skin is not pale.     Findings: No erythema or rash.  Neurological:  Mental Status: She is alert and oriented to person, place, and time.     Sensory: No sensory deficit.     Gait: Gait abnormal.  Psychiatric:        Mood and Affect: Mood normal.        Behavior: Behavior normal.        Thought Content: Thought content normal.        Judgment: Judgment normal.    Labs reviewed: Recent Labs    10/09/18 0807 10/10/18 0837 10/11/18 0345  NA 138 139 138  K 3.5 3.7 3.6  CL 104 106 105  CO2 24 23 23   GLUCOSE 113* 115* 105*  BUN 8 7* 9  CREATININE 1.28* 1.15* 1.23*  CALCIUM 8.4* 8.5* 8.1*   Recent Labs    03/02/18 1043  AST 15  ALT 9  BILITOT 0.7  PROT 7.4   Recent Labs    03/02/18 1043 10/03/18 0917  10/09/18 0327 10/10/18 0837 10/11/18 0345  WBC 7.1 7.6   < > 8.0 9.8 8.7  NEUTROABS 3,933 4.9  --   --   --   --   HGB 12.8 12.3   < > 7.0* 11.3* 10.6*  HCT 35.8 36.4   < > 21.3* 33.1* 31.5*  MCV 91.8 93.8   < > 91.0 90.9 90.5  PLT 197 179   < > 235 253 266   < > = values in this interval not displayed.   Lab Results  Component Value Date   TSH 1.45 03/02/2018   Lab Results  Component Value Date   HGBA1C 5.9 (H) 10/11/2017    Lab Results  Component Value Date   CHOL 128 03/02/2018   HDL 33 (L) 03/02/2018   LDLCALC 71 03/02/2018   TRIG 162 (H) 03/02/2018   CHOLHDL 3.9 03/02/2018    Significant Diagnostic Results in last 30 days:  No results found.  Assessment/Plan   Impacted cerumen of right ear Afebrile. - Ear Lavaged with warm water.Also cerumen removed with a curete small amounts of cerumen removed.TM clear.Patient tolerated procedure well.   Family/ staff Communication: Reviewed plan of care with patient. Labs/tests ordered: None  Follow up: 3 months for management of medical chronic issues.  Sandrea Hughs, NP

## 2018-11-06 NOTE — Patient Instructions (Signed)
Earwax Buildup, Adult  The ears produce a substance called earwax that helps keep bacteria out of the ear and protects the skin in the ear canal. Occasionally, earwax can build up in the ear and cause discomfort or hearing loss.  What increases the risk?  This condition is more likely to develop in people who:  · Are female.  · Are elderly.  · Naturally produce more earwax.  · Clean their ears often with cotton swabs.  · Use earplugs often.  · Use in-ear headphones often.  · Wear hearing aids.  · Have narrow ear canals.  · Have earwax that is overly thick or sticky.  · Have eczema.  · Are dehydrated.  · Have excess hair in the ear canal.  What are the signs or symptoms?  Symptoms of this condition include:  · Reduced or muffled hearing.  · A feeling of fullness in the ear or feeling that the ear is plugged.  · Fluid coming from the ear.  · Ear pain.  · Ear itch.  · Ringing in the ear.  · Coughing.  · An obvious piece of earwax that can be seen inside the ear canal.  How is this diagnosed?  This condition may be diagnosed based on:  · Your symptoms.  · Your medical history.  · An ear exam. During the exam, your health care provider will look into your ear with an instrument called an otoscope.  You may have tests, including a hearing test.  How is this treated?  This condition may be treated by:  · Using ear drops to soften the earwax.  · Having the earwax removed by a health care provider. The health care provider may:  ? Flush the ear with water.  ? Use an instrument that has a loop on the end (curette).  ? Use a suction device.  · Surgery to remove the wax buildup. This may be done in severe cases.  Follow these instructions at home:    · Take over-the-counter and prescription medicines only as told by your health care provider.  · Do not put any objects, including cotton swabs, into your ear. You can clean the opening of your ear canal with a washcloth or facial tissue.  · Follow instructions from your health care  provider about cleaning your ears. Do not over-clean your ears.  · Drink enough fluid to keep your urine clear or pale yellow. This will help to thin the earwax.  · Keep all follow-up visits as told by your health care provider. If earwax builds up in your ears often or if you use hearing aids, consider seeing your health care provider for routine, preventive ear cleanings. Ask your health care provider how often you should schedule your cleanings.  · If you have hearing aids, clean them according to instructions from the manufacturer and your health care provider.  Contact a health care provider if:  · You have ear pain.  · You develop a fever.  · You have blood, pus, or other fluid coming from your ear.  · You have hearing loss.  · You have ringing in your ears that does not go away.  · Your symptoms do not improve with treatment.  · You feel like the room is spinning (vertigo).  Summary  · Earwax can build up in the ear and cause discomfort or hearing loss.  · The most common symptoms of this condition include reduced or muffled hearing and a feeling of   fullness in the ear or feeling that the ear is plugged.  · This condition may be diagnosed based on your symptoms, your medical history, and an ear exam.  · This condition may be treated by using ear drops to soften the earwax or by having the earwax removed by a health care provider.  · Do not put any objects, including cotton swabs, into your ear. You can clean the opening of your ear canal with a washcloth or facial tissue.  This information is not intended to replace advice given to you by your health care provider. Make sure you discuss any questions you have with your health care provider.  Document Released: 10/13/2004 Document Revised: 08/17/2017 Document Reviewed: 11/16/2016  Elsevier Interactive Patient Education © 2019 [employer]

## 2018-11-08 LAB — COMPLETE METABOLIC PANEL WITH GFR
AG Ratio: 1.4 (calc) (ref 1.0–2.5)
ALT: 7 U/L (ref 6–29)
AST: 14 U/L (ref 10–35)
Albumin: 4.1 g/dL (ref 3.6–5.1)
Alkaline phosphatase (APISO): 129 U/L (ref 37–153)
BUN/Creatinine Ratio: 14 (calc) (ref 6–22)
BUN: 19 mg/dL (ref 7–25)
CO2: 24 mmol/L (ref 20–32)
Calcium: 8.9 mg/dL (ref 8.6–10.4)
Chloride: 100 mmol/L (ref 98–110)
Creat: 1.36 mg/dL — ABNORMAL HIGH (ref 0.50–0.99)
GFR, Est African American: 47 mL/min/{1.73_m2} — ABNORMAL LOW (ref >=60)
GFR, Est Non African American: 40 mL/min/{1.73_m2} — ABNORMAL LOW (ref >=60)
Globulin: 3 g/dL (calc) (ref 1.9–3.7)
Glucose, Bld: 116 mg/dL — ABNORMAL HIGH (ref 65–99)
Potassium: 3.8 mmol/L (ref 3.5–5.3)
Sodium: 135 mmol/L (ref 135–146)
Total Bilirubin: 0.8 mg/dL (ref 0.2–1.2)
Total Protein: 7.1 g/dL (ref 6.1–8.1)

## 2018-11-08 LAB — CBC WITH DIFFERENTIAL/PLATELET
Absolute Monocytes: 581 cells/uL (ref 200–950)
BASOS ABS: 28 {cells}/uL (ref 0–200)
Basophils Relative: 0.4 %
Eosinophils Absolute: 301 cells/uL (ref 15–500)
Eosinophils Relative: 4.3 %
HCT: 33.9 % — ABNORMAL LOW (ref 35.0–45.0)
Hemoglobin: 11.5 g/dL — ABNORMAL LOW (ref 11.7–15.5)
Lymphs Abs: 1596 cells/uL (ref 850–3900)
MCH: 31.3 pg (ref 27.0–33.0)
MCHC: 33.9 g/dL (ref 32.0–36.0)
MCV: 92.1 fL (ref 80.0–100.0)
MONOS PCT: 8.3 %
MPV: 10.9 fL (ref 7.5–12.5)
Neutro Abs: 4494 cells/uL (ref 1500–7800)
Neutrophils Relative %: 64.2 %
PLATELETS: 150 10*3/uL (ref 140–400)
RBC: 3.68 10*6/uL — ABNORMAL LOW (ref 3.80–5.10)
RDW: 13.1 % (ref 11.0–15.0)
Total Lymphocyte: 22.8 %
WBC: 7 10*3/uL (ref 3.8–10.8)

## 2018-11-08 LAB — TEST AUTHORIZATION

## 2018-11-08 LAB — HEMOGLOBIN A1C
Hgb A1c MFr Bld: 5.8 % of total Hgb — ABNORMAL HIGH (ref <5.7)
Mean Plasma Glucose: 120 (calc)
eAG (mmol/L): 6.6 (calc)

## 2018-11-09 ENCOUNTER — Other Ambulatory Visit: Payer: Self-pay

## 2018-11-09 DIAGNOSIS — I999 Unspecified disorder of circulatory system: Secondary | ICD-10-CM

## 2018-11-09 DIAGNOSIS — M79672 Pain in left foot: Secondary | ICD-10-CM

## 2018-11-09 DIAGNOSIS — I10 Essential (primary) hypertension: Secondary | ICD-10-CM

## 2018-11-09 DIAGNOSIS — R739 Hyperglycemia, unspecified: Secondary | ICD-10-CM

## 2018-11-09 DIAGNOSIS — R7303 Prediabetes: Secondary | ICD-10-CM

## 2018-11-12 DIAGNOSIS — I739 Peripheral vascular disease, unspecified: Secondary | ICD-10-CM | POA: Diagnosis not present

## 2018-11-12 DIAGNOSIS — R69 Illness, unspecified: Secondary | ICD-10-CM | POA: Diagnosis not present

## 2018-11-12 DIAGNOSIS — I10 Essential (primary) hypertension: Secondary | ICD-10-CM | POA: Diagnosis not present

## 2018-11-12 DIAGNOSIS — K5900 Constipation, unspecified: Secondary | ICD-10-CM | POA: Diagnosis not present

## 2018-11-12 DIAGNOSIS — Z7982 Long term (current) use of aspirin: Secondary | ICD-10-CM | POA: Diagnosis not present

## 2018-11-12 DIAGNOSIS — Z880 Allergy status to penicillin: Secondary | ICD-10-CM | POA: Diagnosis not present

## 2018-11-12 DIAGNOSIS — I69354 Hemiplegia and hemiparesis following cerebral infarction affecting left non-dominant side: Secondary | ICD-10-CM | POA: Diagnosis not present

## 2018-11-12 DIAGNOSIS — Z809 Family history of malignant neoplasm, unspecified: Secondary | ICD-10-CM | POA: Diagnosis not present

## 2018-11-12 DIAGNOSIS — E785 Hyperlipidemia, unspecified: Secondary | ICD-10-CM | POA: Diagnosis not present

## 2018-11-23 ENCOUNTER — Other Ambulatory Visit: Payer: Self-pay | Admitting: Cardiovascular Disease

## 2018-12-05 DIAGNOSIS — R531 Weakness: Secondary | ICD-10-CM | POA: Diagnosis not present

## 2018-12-05 DIAGNOSIS — Z95820 Peripheral vascular angioplasty status with implants and grafts: Secondary | ICD-10-CM | POA: Diagnosis not present

## 2019-01-05 DIAGNOSIS — Z95820 Peripheral vascular angioplasty status with implants and grafts: Secondary | ICD-10-CM | POA: Diagnosis not present

## 2019-01-05 DIAGNOSIS — R531 Weakness: Secondary | ICD-10-CM | POA: Diagnosis not present

## 2019-01-15 ENCOUNTER — Other Ambulatory Visit: Payer: Self-pay

## 2019-01-15 DIAGNOSIS — I739 Peripheral vascular disease, unspecified: Secondary | ICD-10-CM

## 2019-01-16 ENCOUNTER — Other Ambulatory Visit: Payer: Self-pay | Admitting: Cardiovascular Disease

## 2019-01-18 ENCOUNTER — Other Ambulatory Visit: Payer: Self-pay | Admitting: Cardiovascular Disease

## 2019-01-30 ENCOUNTER — Other Ambulatory Visit: Payer: Self-pay

## 2019-01-30 ENCOUNTER — Encounter: Payer: Self-pay | Admitting: Family

## 2019-01-30 ENCOUNTER — Ambulatory Visit (INDEPENDENT_AMBULATORY_CARE_PROVIDER_SITE_OTHER): Payer: Medicare HMO | Admitting: Family

## 2019-01-30 DIAGNOSIS — K5901 Slow transit constipation: Secondary | ICD-10-CM | POA: Diagnosis not present

## 2019-01-30 DIAGNOSIS — Z8673 Personal history of transient ischemic attack (TIA), and cerebral infarction without residual deficits: Secondary | ICD-10-CM

## 2019-01-30 DIAGNOSIS — E785 Hyperlipidemia, unspecified: Secondary | ICD-10-CM | POA: Diagnosis not present

## 2019-01-30 DIAGNOSIS — R7303 Prediabetes: Secondary | ICD-10-CM

## 2019-01-30 DIAGNOSIS — I10 Essential (primary) hypertension: Secondary | ICD-10-CM

## 2019-01-30 MED ORDER — CARVEDILOL 3.125 MG PO TABS: 3.1250 mg | ORAL_TABLET | Freq: Two times a day (BID) | ORAL | 1 refills | Status: DC

## 2019-01-30 MED ORDER — CLONIDINE HCL 0.2 MG PO TABS: 0.2000 mg | ORAL_TABLET | Freq: Two times a day (BID) | ORAL | 1 refills | Status: DC

## 2019-01-30 MED ORDER — LISINOPRIL 40 MG PO TABS: 40.0000 mg | ORAL_TABLET | Freq: Every day | ORAL | 1 refills | Status: DC

## 2019-01-30 NOTE — Progress Notes (Signed)
This service is provided via telemedicine  No vital signs collected/recorded due to the encounter was a telemedicine visit.   Location of patient (ex: home, work):  Home   Patient consents to a telephone visit:  Yes  Location of the provider (ex: office, home):  Office   Names of all persons participating in the telemedicine service and their role in the encounter:  Ruthell Rummage CMA, Fabrizio Filip NP, [name]   Time spent on call:  Ruthell Rummage CMA spent   Minutes on phone with patient     Location:   [facility] of Service:   [facility]   Provider: Marlowe Sax NP   Code Status: FULL Goals of Care:  Advanced Directives 10/03/2018  Does Patient Have a Medical Advance Directive? No  Type of Advance Directive -  Does patient want to make changes to medical advance directive? -  Copy of [place] in Chart? -  Would patient like information on creating a medical advance directive? No - Patient declined     Chief Complaint  Patient presents with  . Medical Management of Chronic Issues    3 month follow up     HPI: Patient is a 68 y.o. female seen today for medical management of chronic diseases.she denies any acute issues during visit.she states since discharged from the hospital 10/11/2018 for left lower leg ischemic infarction she has not required her oxycodone for pain.state medication can be discontinued.  Hypertension - checks her blood pressure once in a while when she goes to the drug store.latest reading was 122/80.she denies any headache,dizziness, chest pain ,faint feeling or shortness of breath.she is currently on carvedilol 3.125 mg tablet twice daily,clonidine 0.2 mg tablet twice daily,hydrochlorothiazide 25 mg tablet daily and lisinopril 40 mg tablet daily.  Hyperlipidemia - on atorvastatin 80 mg tablet daily.discussed low carbohydrate,low saturated fat and high vegetable diet.Exercises by walking within the house.    Prediabetes - recent Hgb A1C 5.8 (11/06/18).low concentrated sweet diet discussed.   Constipation - has improved after stopping oxycodone.on Miralax daily.encouraged to increase fiber in the diet.   Hx CVA - denies any weakness.states left leg still slightly swollen but has improved.takes ASA 81 mg tablet daily.     Past Medical History:  Diagnosis Date  . Clotting disorder ([place])   . Hyperlipidemia   . Hypertension   . Peripheral vascular disease ([place])    vein surgery, left leg  . Stroke [facility]) 2002   ongoing mild loss of feeling in left side, had to learn to write right-handed  . Substance abuse ([place])   . Tobacco abuse     Past Surgical History:  Procedure Laterality Date  . AORTA - BILATERAL FEMORAL ARTERY BYPASS GRAFT N/A 07/14/2016   Procedure: AORTOBIFEMORAL BYPASS GRAFT;  Surgeon: Waynetta Sandy, MD;  Location: [facility];  Service: Vascular;  Laterality: N/A;  . AORTA - BILATERAL FEMORAL ARTERY BYPASS GRAFT Left 10/03/2018   Procedure: THROMBECTOMY OF LEFT LEG, LEFT FEMORAL-POPLITEAL BYPASS GRAFT, RIGHT TO LEFT FEMORAL-FEMORAL BYPASS GRAFT;  Surgeon: Rosetta Posner, MD;  Location: [facility];  Service: Vascular;  Laterality: Left;  . CARDIAC CATHETERIZATION N/A 07/11/2016   Procedure: Left Heart Cath and Coronary Angiography;  Surgeon: Nelva Bush, MD;  Location: [facility];  Service: Cardiovascular;  Laterality: N/A;  . FEMORAL-POPLITEAL BYPASS GRAFT Left 07/14/2016   Procedure: REDO BYPASS GRAFT FEMORAL-POPLITEAL ARTERY;  Surgeon: Waynetta Sandy, MD;  Location: [facility];  Service: Vascular;  Laterality: Left;  . PERIPHERAL VASCULAR CATHETERIZATION N/A 07/08/2016   Procedure: Abdominal Aortogram w/ Bilateral Lower Extremity Runoff;  Surgeon: Elam Dutch, MD;  Location: [facility];  Service: Cardiovascular;  Laterality: N/A;  . VEIN SURGERY Left     Allergies  Allergen Reactions  . Penicillins Swelling    Has patient had a PCN reaction causing  immediate rash, facial/tongue/throat swelling, SOB or lightheadedness with hypotension: YES Has patient had a PCN reaction causing severe rash involving mucus membranes or skin necrosis: NO Has patient had a PCN reaction that required hospitalization NO Has patient had a PCN reaction occurring within the last 10 years: NO If all of the above answers are "NO", then may proceed with Cephalosporin use.    Outpatient Encounter Medications as of 01/30/2019  Medication Sig  . aspirin 81 MG EC tablet Take 1 tablet (81 mg total) by mouth daily. Swallow whole.  Marland Kitchen atorvastatin (LIPITOR) 80 MG tablet Take 1 tablet (80 mg total) by mouth daily.  . carbamide peroxide (DEBROX) 6.5 % OTIC solution Place 5 drops into the right ear 2 (two) times daily.  . carvedilol (COREG) 3.125 MG tablet TAKE 1 TABLET (3.125 MG TOTAL) BY MOUTH 2 (TWO) TIMES DAILY WITH A MEAL.  . cloNIDine (CATAPRES) 0.2 MG tablet TAKE 1 TABLET (0.2 MG TOTAL) BY MOUTH 2 (TWO) TIMES DAILY.  . hydrochlorothiazide (HYDRODIURIL) 25 MG tablet TAKE 1 TABLET BY MOUTH EVERY DAY  . lisinopril (ZESTRIL) 40 MG tablet Take 1 tablet (40 mg total) by mouth daily. Pt needs to call and make appt to continuing getting refills  . mineral oil-hydrophilic petrolatum (AQUAPHOR) ointment Apply topically 2 (two) times daily. Apply to lower extremities  . oxyCODONE (OXY IR/ROXICODONE) 5 MG immediate release tablet Take 5 mg by mouth every 6 (six) hours as needed for moderate pain or severe pain.  . polyethylene glycol (MIRALAX / GLYCOLAX) packet Take 17 g by mouth daily.   No facility-administered encounter medications on file as of 01/30/2019.     Review of Systems:  Review of Systems  Constitutional: Negative for appetite change, chills, fatigue, fever and unexpected weight change.  HENT: Negative for congestion, rhinorrhea, sinus pressure, sinus pain, sneezing and sore throat.   Eyes: Negative for photophobia, pain, discharge, redness and itching.   Respiratory: Negative for cough, chest tightness, shortness of breath and wheezing.   Cardiovascular: Positive for leg swelling. Negative for chest pain and palpitations.       Left leg   Gastrointestinal: Negative for abdominal distention, abdominal pain, constipation, diarrhea, nausea and vomiting.  Endocrine: Negative for cold intolerance, heat intolerance, polydipsia, polyphagia and polyuria.  Genitourinary: Negative for difficulty urinating, dysuria, flank pain, frequency and urgency.  Musculoskeletal: Positive for gait problem. Negative for arthralgias.  Skin: Negative for color change, pallor, rash and wound.  Neurological: Negative for dizziness, weakness, light-headedness, numbness and headaches.  Psychiatric/Behavioral: Negative for agitation, confusion and sleep disturbance. The patient is not nervous/anxious.     Health Maintenance  Topic Date Due  . Hepatitis C Screening  11-14-1950  . DEXA SCAN  12/20/2016  . PNA vac Low Risk Adult (2 of 2 - PCV13) 04/04/2018  . COLONOSCOPY  09/19/2028 (Originally 12/20/2001)  . TETANUS/TDAP  09/19/2028 (Originally 12/21/1970)  . INFLUENZA VACCINE  04/20/2019  . MAMMOGRAM  11/02/2019    Physical Exam: There were no vitals filed for this visit. There is no height or weight on file to calculate BMI. Physical Exam  Unable to complete on telephone visit.   Labs reviewed: Basic Metabolic Panel: Recent Labs    03/02/18 1043  10/10/18 0837 10/11/18 0345 11/06/18 1018  NA 140   < > 139 138 135  K 4.2   < > 3.7 3.6 3.8  CL 105   < > 106 105 100  CO2 26   < > 23 23 24   GLUCOSE 99   < > 115* 105* 116*  BUN 18   < > 7* 9 19  CREATININE 1.25*   < > 1.15* 1.23* 1.36*  CALCIUM 9.5   < > 8.5* 8.1* 8.9  TSH 1.45  --   --   --   --    < > = values in this interval not displayed.   Liver Function Tests: Recent Labs    03/02/18 1043 11/06/18 1018  AST 15 14  ALT 9 7  BILITOT 0.7 0.8  PROT 7.4 7.1   CBC: Recent Labs    03/02/18  1043 10/03/18 0917  10/10/18 0837 10/11/18 0345 11/06/18 1018  WBC 7.1 7.6   < > 9.8 8.7 7.0  NEUTROABS 3,933 4.9  --   --   --  4,494  HGB 12.8 12.3   < > 11.3* 10.6* 11.5*  HCT 35.8 36.4   < > 33.1* 31.5* 33.9*  MCV 91.8 93.8   < > 90.9 90.5 92.1  PLT 197 179   < > 253 266 150   < > = values in this interval not displayed.   Lipid Panel: Recent Labs    03/02/18 1043  CHOL 128  HDL 33*  LDLCALC 71  TRIG 162*  CHOLHDL 3.9   Lab Results  Component Value Date   HGBA1C 5.8 (H) 11/06/2018    Procedures since last visit: No results found.  Assessment/Plan 1. Essential hypertension Recent B/p under control.continue on carvedilol 3.125 mg tablet twice daily,clonidine 0.2 mg tablet twice daily,hydrochlorothiazide 25 mg tablet daily and lisinopril 40 mg tablet daily.continue on ASA and Statin for cardiovascular event prophylaxis.  - CBC/diff,CMP,future    2. Hyperlipidemia LDL goal <70 Continue on atorvastatin 80 mg tablet daily. Recommend low carbohydrate,low saturated fat and high vegetable diet.Exercise as tolerated. - Lipid Panel; Future  3. Prediabetes Lab Results  Component Value Date   HGBA1C 5.8 (H) 11/06/2018  low concentrated sweet diet discussed.  4. Slow transit constipation Current regimen effective.encouraged to increase fluid intake and high fiber diet.   5. History of CVA (cerebrovascular accident) Continue on atorvastatin 80 mg tablet daily and ASA.   Labs/tests ordered:  Lipid panel,future.CBC/diff,CMP,Hgb A1C orders in place.   Next appt:  4 months for medical management of chronic issues  Time spent with patient 21 minutes >50% time spent counseling; reviewing medical record; tests; labs; and developing future plan of care

## 2019-01-31 ENCOUNTER — Encounter (HOSPITAL_COMMUNITY): Payer: Medicare HMO

## 2019-01-31 ENCOUNTER — Ambulatory Visit: Payer: Medicare HMO | Admitting: Family

## 2019-02-04 DIAGNOSIS — R531 Weakness: Secondary | ICD-10-CM | POA: Diagnosis not present

## 2019-02-04 DIAGNOSIS — Z95820 Peripheral vascular angioplasty status with implants and grafts: Secondary | ICD-10-CM | POA: Diagnosis not present

## 2019-02-05 ENCOUNTER — Ambulatory Visit: Payer: Medicare HMO | Admitting: Family

## 2019-02-21 ENCOUNTER — Other Ambulatory Visit: Payer: Self-pay | Admitting: Cardiovascular Disease

## 2019-02-22 ENCOUNTER — Other Ambulatory Visit: Payer: Self-pay | Admitting: Cardiovascular Disease

## 2019-03-04 ENCOUNTER — Telehealth: Payer: Self-pay

## 2019-03-04 NOTE — Telephone Encounter (Signed)
YOUR CARDIOLOGY TEAM HAS ARRANGED FOR AN E-VISIT FOR YOUR APPOINTMENT - PLEASE REVIEW IMPORTANT INFORMATION BELOW SEVERAL DAYS PRIOR TO YOUR APPOINTMENT  Due to the recent COVID-19 pandemic, we are transitioning in-person office visits to tele-medicine visits in an effort to decrease unnecessary exposure to our patients, their families, and staff. These visits are billed to your insurance just like a normal visit is. We also encourage you to sign up for MyChart if you have not already done so. You will need a smartphone if possible. For patients that do not have this, we can still complete the visit using a regular telephone but do prefer a smartphone to enable video when possible. You may have a family member that lives with you that can help. If possible, we also ask that you have a blood pressure cuff and scale at home to measure your blood pressure, heart rate and weight prior to your scheduled appointment. Patients with clinical needs that need an in-person evaluation and testing will still be able to come to the office if absolutely necessary. If you have any questions, feel free to call our office.     YOUR PROVIDER WILL BE USING THE FOLLOWING PLATFORM TO COMPLETE YOUR VISIT: Doximity  . IF USING MYCHART - How to Download the MyChart App to Your SmartPhone   - If Apple, go to App Store and type in MyChart in the search bar and download the app. If Android, ask patient to go to Google Play Store and type in MyChart in the search bar and download the app. The app is free but as with any other app downloads, your phone may require you to verify saved payment information or Apple/Android password.  - You will need to then log into the app with your MyChart username and password, and select [facility] as your healthcare provider to link the account.  - When it is time for your visit, go to the MyChart app, find appointments, and click Begin Video Visit. Be sure to Select Allow for your device to  access the Microphone and Camera for your visit. You will then be connected, and your provider will be with you shortly.  **If you have any issues connecting or need assistance, please contact MyChart service desk [phone] ([phone])**  **If using a computer, in order to ensure the best quality for your visit, you will need to use either of the following Internet Browsers: Google Chrome or Microsoft Edge**  . IF USING DOXIMITY or [email or web address].ME - The staff will give you instructions on receiving your link to join the meeting the day of your visit.      2-3 DAYS BEFORE YOUR APPOINTMENT  You will receive a telephone call from one of our [facility] team members - your caller ID may say "Unknown caller." If this is a video visit, we will walk you through how to get the video launched on your phone. We will remind you check your blood pressure, heart rate and weight prior to your scheduled appointment. If you have an Apple Watch or Kardia, please upload any pertinent ECG strips the day before or morning of your appointment to MyChart. Our staff will also make sure you have reviewed the consent and agree to move forward with your scheduled tele-health visit.     THE DAY OF YOUR APPOINTMENT  Approximately 15 minutes prior to your scheduled appointment, you will receive a telephone call from one of [facility] team - your caller ID may say "Unknown caller."    Our staff will confirm medications, vital signs for the day and any symptoms you may be experiencing. Please have this information available prior to the time of visit start. It may also be helpful for you to have a pad of paper and pen handy for any instructions given during your visit. They will also walk you through joining the smartphone meeting if this is a video visit.    CONSENT FOR TELE-HEALTH VISIT - PLEASE REVIEW  I hereby voluntarily request, consent and authorize [facility] and its employed or contracted physicians, physician  assistants, nurse practitioners or other licensed health care professionals (the Practitioner), to provide me with telemedicine health care services (the "Services") as deemed necessary by the treating Practitioner. I acknowledge and consent to receive the Services by the Practitioner via telemedicine. I understand that the telemedicine visit will involve communicating with the Practitioner through live audiovisual communication technology and the disclosure of certain medical information by electronic transmission. I acknowledge that I have been given the opportunity to request an in-person assessment or other available alternative prior to the telemedicine visit and am voluntarily participating in the telemedicine visit.  I understand that I have the right to withhold or withdraw my consent to the use of telemedicine in the course of my care at any time, without affecting my right to future care or treatment, and that the Practitioner or I may terminate the telemedicine visit at any time. I understand that I have the right to inspect all information obtained and/or recorded in the course of the telemedicine visit and may receive copies of available information for a reasonable fee.  I understand that some of the potential risks of receiving the Services via telemedicine include:  . Delay or interruption in medical evaluation due to technological equipment failure or disruption; . Information transmitted may not be sufficient (e.g. poor resolution of images) to allow for appropriate medical decision making by the Practitioner; and/or  . In rare instances, security protocols could fail, causing a breach of personal health information.  Furthermore, I acknowledge that it is my responsibility to provide information about my medical history, conditions and care that is complete and accurate to the best of my ability. I acknowledge that Practitioner's advice, recommendations, and/or decision may be based on  factors not within their control, such as incomplete or inaccurate data provided by me or distortions of diagnostic images or specimens that may result from electronic transmissions. I understand that the practice of medicine is not an exact science and that Practitioner makes no warranties or guarantees regarding treatment outcomes. I acknowledge that I will receive a copy of this consent concurrently upon execution via email to the email address I last provided but may also request a printed copy by calling the office of [facility].    I understand that my insurance will be billed for this visit.   I have read or had this consent read to me. . I understand the contents of this consent, which adequately explains the benefits and risks of the Services being provided via telemedicine.  . I have been provided ample opportunity to ask questions regarding this consent and the Services and have had my questions answered to my satisfaction. . I give my informed consent for the services to be provided through the use of telemedicine in my medical care  By participating in this telemedicine visit I agree to the above.  

## 2019-03-05 ENCOUNTER — Telehealth (INDEPENDENT_AMBULATORY_CARE_PROVIDER_SITE_OTHER): Payer: Medicare HMO | Admitting: Cardiology

## 2019-03-05 ENCOUNTER — Other Ambulatory Visit: Payer: Self-pay

## 2019-03-05 ENCOUNTER — Encounter: Payer: Self-pay | Admitting: Cardiology

## 2019-03-05 VITALS — BP 123/80 | Ht 65.0 in | Wt 149.0 lb

## 2019-03-05 DIAGNOSIS — I739 Peripheral vascular disease, unspecified: Secondary | ICD-10-CM

## 2019-03-05 DIAGNOSIS — I10 Essential (primary) hypertension: Secondary | ICD-10-CM | POA: Diagnosis not present

## 2019-03-05 DIAGNOSIS — Z8673 Personal history of transient ischemic attack (TIA), and cerebral infarction without residual deficits: Secondary | ICD-10-CM

## 2019-03-05 DIAGNOSIS — Z720 Tobacco use: Secondary | ICD-10-CM

## 2019-03-05 DIAGNOSIS — E785 Hyperlipidemia, unspecified: Secondary | ICD-10-CM

## 2019-03-05 NOTE — Progress Notes (Addendum)
Virtual Visit via Video Note   This visit type was conducted due to national recommendations for restrictions regarding the COVID-19 Pandemic (e.g. social distancing) in an effort to limit this patient's exposure and mitigate transmission in our community.  Due to her co-morbid illnesses, this patient is at least at moderate risk for complications without adequate follow up.  This format is felt to be most appropriate for this patient at this time.  All issues noted in this document were discussed and addressed.  A limited physical exam was performed with this format.  Please refer to the patient's chart for her consent to telehealth for [facility].   Date:  03/05/2019   ID:  Wendy Townsend, DOB March 11, 1951, MRN [id number]  Patient Location: Home Provider Location: Office  PCP:  Sandrea Hughs, NP  Cardiologist:  Mertie Moores, MD  Electrophysiologist:  None   Evaluation Performed:  Follow-Up Visit  Chief Complaint:  Follow up hypertension, HLD  History of Present Illness:    Wendy Townsend is a 68 y.o. female with hypertension, hyperlipidemia, PAD, tobacco use and previous CVA. She had a cardiac cath in 2017 that showed no significant coronary artery disease. She has prior history of redo left fem-pop bypass, aorto bifem byass, and left profunda endarectomy  by Dr. Bridgett Larsson and by Dr. Donzetta Matters 06/2016.   In 09/2018 the patient presented to the ED with profound ischemia of the left foot. Her past left fem-pop bypass was occluded. She was taken urgently to the OR by Dr. Donnetta Hutching for revascularization- Right to left femorofemoral bypass and thrombectomy of left femoral to popliteal bypass.   Ms. Lemere is seen today for annual follow up. She denies CP, SOB, lightheadedness, orthpnea, PND She has a little edema in her leg.   SMOKING quit earlier this year.    The patient does not have symptoms concerning for COVID-19 infection (fever, chills, cough, or new shortness of breath).    Past  Medical History:  Diagnosis Date  . Clotting disorder ([place])   . Hyperlipidemia   . Hypertension   . Peripheral vascular disease ([place])    vein surgery, left leg  . Stroke [facility]) 2002   ongoing mild loss of feeling in left side, had to learn to write right-handed  . Substance abuse ([place])   . Tobacco abuse    Past Surgical History:  Procedure Laterality Date  . AORTA - BILATERAL FEMORAL ARTERY BYPASS GRAFT N/A 07/14/2016   Procedure: AORTOBIFEMORAL BYPASS GRAFT;  Surgeon: Waynetta Sandy, MD;  Location: [place];  Service: Vascular;  Laterality: N/A;  . AORTA - BILATERAL FEMORAL ARTERY BYPASS GRAFT Left 10/03/2018   Procedure: THROMBECTOMY OF LEFT LEG, LEFT FEMORAL-POPLITEAL BYPASS GRAFT, RIGHT TO LEFT FEMORAL-FEMORAL BYPASS GRAFT;  Surgeon: Rosetta Posner, MD;  Location: [place];  Service: Vascular;  Laterality: Left;  . CARDIAC CATHETERIZATION N/A 07/11/2016   Procedure: Left Heart Cath and Coronary Angiography;  Surgeon: Nelva Bush, MD;  Location: [place] CV LAB;  Service: Cardiovascular;  Laterality: N/A;  . FEMORAL-POPLITEAL BYPASS GRAFT Left 07/14/2016   Procedure: REDO BYPASS GRAFT FEMORAL-POPLITEAL ARTERY;  Surgeon: Waynetta Sandy, MD;  Location: [place];  Service: Vascular;  Laterality: Left;  . PERIPHERAL VASCULAR CATHETERIZATION N/A 07/08/2016   Procedure: Abdominal Aortogram w/ Bilateral Lower Extremity Runoff;  Surgeon: Elam Dutch, MD;  Location: [place] CV LAB;  Service: Cardiovascular;  Laterality: N/A;  . VEIN SURGERY Left      Current Meds  Medication Sig  .  aspirin 81 MG EC tablet Take 1 tablet (81 mg total) by mouth daily. Swallow whole.  Marland Kitchen atorvastatin (LIPITOR) 80 MG tablet Take 80 mg by mouth daily.  . carvedilol (COREG) 3.125 MG tablet Take 1 tablet (3.125 mg total) by mouth 2 (two) times daily with a meal.  . cloNIDine (CATAPRES) 0.2 MG tablet Take 1 tablet (0.2 mg total) by mouth 2 (two) times daily.  . hydrochlorothiazide  (HYDRODIURIL) 25 MG tablet TAKE 1 TABLET BY MOUTH EVERY DAY  . lisinopril (ZESTRIL) 40 MG tablet Take 1 tablet (40 mg total) by mouth daily.  . mineral oil-hydrophilic petrolatum (AQUAPHOR) ointment Apply topically 2 (two) times daily. Apply to lower extremities     Allergies:   Penicillins   Social History   Tobacco Use  . Smoking status: Former Smoker    Years: 40.00    Types: Cigarettes    Quit date: 05/2017    Years since quitting: 1.7  . Smokeless tobacco: Never Used  . Tobacco comment: 10/04/2018 "stopped for awhile then started back"  Substance Use Topics  . Alcohol use: Not Currently    Comment: 2-3 times a year  . Drug use: Yes    Types: Marijuana    Comment: when she can't sleep sometimes at night     Family Hx: The patient's family history includes Cancer in her brother and sister; Hypertension (age of onset: 65) in her mother. There is no history of Colon cancer, Colon polyps, Esophageal cancer, Rectal cancer, or Stomach cancer.  ROS:   Please see the history of present illness.     All other systems reviewed and are negative.   Prior CV studies:   The following studies were reviewed today:  [facility] 07/11/2016 Conclusions: 1. Mild to moderate non-obstructive coronary artery disease, as detailed below. 2. Normal left ventricular filling pressure. 3. Systemic hypertension  Recommendations: 1. No high-grade stenosis to prevent proceeding with urgent vascular surgery. 2. Continue secondary prevention of atherosclerotic cardiovascular disease. 3. Start standing carvedilol 6.25 mg twice a day, with uptitration as heart rate and blood pressure allow. 4. Switch simvastatin to atorvastatin 80 mg daily.   Labs/Other Tests and Data Reviewed:    EKG:  No ECG reviewed.  Recent Labs: 11/06/2018: ALT 7; BUN 19; Creat 1.36; Hemoglobin 11.5; Platelets 150; Potassium 3.8; Sodium 135   Recent Lipid Panel Lab Results  Component Value Date/Time   CHOL 128 03/02/2018  10:43 AM   TRIG 162 (H) 03/02/2018 10:43 AM   HDL 33 (L) 03/02/2018 10:43 AM   CHOLHDL 3.9 03/02/2018 10:43 AM   LDLCALC 71 03/02/2018 10:43 AM    Wt Readings from Last 3 Encounters:  03/05/19 149 lb (67.6 kg)  11/06/18 149 lb 12.8 oz (67.9 kg)  11/02/18 148 lb 3.2 oz (67.2 kg)     Objective:    Vital Signs:  BP 123/80   Ht 5\' 5"  (1.651 m)   Wt 149 lb (67.6 kg)   BMI 24.79 kg/m    VITAL SIGNS:  reviewed GEN:  no acute distress EYES:  sclerae anicteric, EOMI - Extraocular Movements Intact RESPIRATORY:  No audible wheezes or cough. Able to speak in complete sentences. NEURO:  alert and oriented x 3, no obvious focal deficit PSYCH:  normal affect  ASSESSMENT & PLAN:    1. Hypertension -on carvedilol 3.125 mg tablet twice daily, clonidine 0.2 mg tablet twice daily, hydrochlorothiazide 25 mg tablet daily and lisinopril 40 mg tablet daily. -BP well controlled.   2. Hyperlipidemia -  On atorvastatin 80 mg daily -Lipid panel in 02/2018 with LDL 71. Good control. -Continue current medication. Update lipid panel at next lab draw.   3. Peripheral artery disease -In 09/2018 pt had ischemic event with occlusion of her left limb of her aortofemoral graft and of her femoropopliteal bypass. She was taken urgently to the OR for revascularization.  -Closely followed by [facility], Dr. Donnetta Hutching -Continues on aspirin and statin  4. Previous CVA -On aspirin 81 mg daily -Pt denies any further strokes.   5.   Tobacco abuse -Quit after her OAD event earlier this year. Congratulated.   COVID-19 Education: The signs and symptoms of COVID-19 were discussed with the patient and how to seek care for testing (follow up with PCP or arrange E-visit).  The importance of social distancing was discussed today.  Time:   Today, I have spent 10 minutes with the patient with telehealth technology discussing the above problems.     Medication Adjustments/Labs and Tests Ordered: Current medicines are reviewed at  length with the patient today.  Concerns regarding medicines are outlined above.   Tests Ordered: No orders of the defined types were placed in this encounter.   Medication Changes: No orders of the defined types were placed in this encounter.  Follow Up:  Virtual Visit or In Person in 1 year(s)  Signed, Daune Perch, NP  03/06/2019 12:51 PM    [place]

## 2019-03-06 ENCOUNTER — Ambulatory Visit: Payer: Self-pay

## 2019-03-07 DIAGNOSIS — Z95820 Peripheral vascular angioplasty status with implants and grafts: Secondary | ICD-10-CM | POA: Diagnosis not present

## 2019-03-07 DIAGNOSIS — R531 Weakness: Secondary | ICD-10-CM | POA: Diagnosis not present

## 2019-03-18 ENCOUNTER — Other Ambulatory Visit: Payer: Self-pay | Admitting: *Deleted

## 2019-03-18 DIAGNOSIS — Z8673 Personal history of transient ischemic attack (TIA), and cerebral infarction without residual deficits: Secondary | ICD-10-CM

## 2019-03-18 MED ORDER — ASPIRIN 81 MG PO TBEC: 81.0000 mg | DELAYED_RELEASE_TABLET | Freq: Every day | ORAL | 11 refills | Status: DC

## 2019-03-18 NOTE — Telephone Encounter (Signed)
[facility]  [address] 

## 2019-03-20 ENCOUNTER — Other Ambulatory Visit: Payer: Self-pay | Admitting: Cardiovascular Disease

## 2019-04-06 DIAGNOSIS — Z95820 Peripheral vascular angioplasty status with implants and grafts: Secondary | ICD-10-CM | POA: Diagnosis not present

## 2019-04-06 DIAGNOSIS — R531 Weakness: Secondary | ICD-10-CM | POA: Diagnosis not present

## 2019-05-07 DIAGNOSIS — R531 Weakness: Secondary | ICD-10-CM | POA: Diagnosis not present

## 2019-05-07 DIAGNOSIS — Z95820 Peripheral vascular angioplasty status with implants and grafts: Secondary | ICD-10-CM | POA: Diagnosis not present

## 2019-05-28 ENCOUNTER — Other Ambulatory Visit: Payer: Medicare HMO

## 2019-06-03 ENCOUNTER — Other Ambulatory Visit: Payer: Self-pay

## 2019-06-03 ENCOUNTER — Other Ambulatory Visit: Payer: Medicare HMO

## 2019-06-03 DIAGNOSIS — R7303 Prediabetes: Secondary | ICD-10-CM | POA: Diagnosis not present

## 2019-06-03 DIAGNOSIS — I998 Other disorder of circulatory system: Secondary | ICD-10-CM

## 2019-06-03 DIAGNOSIS — E785 Hyperlipidemia, unspecified: Secondary | ICD-10-CM

## 2019-06-03 DIAGNOSIS — I999 Unspecified disorder of circulatory system: Secondary | ICD-10-CM | POA: Diagnosis not present

## 2019-06-03 DIAGNOSIS — R739 Hyperglycemia, unspecified: Secondary | ICD-10-CM

## 2019-06-03 DIAGNOSIS — I10 Essential (primary) hypertension: Secondary | ICD-10-CM

## 2019-06-03 DIAGNOSIS — M79672 Pain in left foot: Secondary | ICD-10-CM

## 2019-06-03 DIAGNOSIS — Z1159 Encounter for screening for other viral diseases: Secondary | ICD-10-CM | POA: Diagnosis not present

## 2019-06-04 ENCOUNTER — Ambulatory Visit (INDEPENDENT_AMBULATORY_CARE_PROVIDER_SITE_OTHER): Payer: Medicare HMO | Admitting: Family

## 2019-06-04 ENCOUNTER — Encounter: Payer: Self-pay | Admitting: Family

## 2019-06-04 DIAGNOSIS — N185 Chronic kidney disease, stage 5: Secondary | ICD-10-CM | POA: Diagnosis not present

## 2019-06-04 DIAGNOSIS — R7303 Prediabetes: Secondary | ICD-10-CM

## 2019-06-04 DIAGNOSIS — E785 Hyperlipidemia, unspecified: Secondary | ICD-10-CM | POA: Diagnosis not present

## 2019-06-04 DIAGNOSIS — I10 Essential (primary) hypertension: Secondary | ICD-10-CM | POA: Diagnosis not present

## 2019-06-04 DIAGNOSIS — Z1159 Encounter for screening for other viral diseases: Secondary | ICD-10-CM

## 2019-06-04 DIAGNOSIS — Z13820 Encounter for screening for osteoporosis: Secondary | ICD-10-CM | POA: Diagnosis not present

## 2019-06-04 DIAGNOSIS — L853 Xerosis cutis: Secondary | ICD-10-CM | POA: Diagnosis not present

## 2019-06-04 MED ORDER — AQUAPHOR EX OINT: TOPICAL_OINTMENT | Freq: Two times a day (BID) | CUTANEOUS | 0 refills | Status: AC

## 2019-06-04 NOTE — Progress Notes (Signed)
This service is provided via telemedicine  No vital signs collected/recorded due to the encounter was a telemedicine visit.   Location of patient (ex: home, work):  Home   Patient consents to a telephone visit: yes  Location of the provider (ex: office, home):  Office   Name of any referring provider:  Marlowe Sax, NP  Names of all persons participating in the telemedicine service and their role in the encounter:  Marlowe Sax, NP, Ruthell Rummage CMA, Zaida Andreas  Time spent on call:  Ruthell Rummage CMA, Spent  5 minutes on phone with patient.     Location:  [facility]  Provider: Marlowe Sax, NP   Code Status: FULL Goals of Care:  Advanced Directives 10/03/2018  Does Patient Have a Medical Advance Directive? No  Type of Advance Directive -  Does patient want to make changes to medical advance directive? -  Copy of [place] in Chart? -  Would patient like information on creating a medical advance directive? No - Patient declined     Chief Complaint  Patient presents with  . Medical Management of Chronic Issues    4 month follow up and discuss recent labs     HPI: Patient is a 68 y.o. female seen today for medical management of chronic diseases.she denies any acute issues during visit.she states has been wearing facial mask and social distancing per COVID-19 guidelines.she has not been in contact with any person sick with COVID-19. Does not think she has lost or gained weight.No recent fall episodes.  Hypertension - No home B/P readings for review.currently on HCTZ 25 mg tablet daily,lisinopril 40 mg tablet daily, carvedilol 3.125 mg tablet twice daily and clonidine 0.2 mg tablet twice daily.also on ASA and Statin,she denies any signs of Hyper/hypotension.   Hyperlipidemia - labs reviewed and discussed with patient  chol 100,HDL 35,TRG 127 and LDL 44 currently on atorvastatin 80 mg tablet daily.she reports no muscle ache or weakness.Her diet  includes high saturated fats.tries to walk outside from the mail box to the house.  Prediabetes - current Hgb A1C 6.1 previous 5.8 reviewed with patient.she states eats cookies and cakes.not on any diabetes medication.  Osteoporosis - No recent Dexa scan for review.she denies any fractures.  CKD stage 4 - labs reviewed CR 2.28,BUN 56 worsening previous CR 1.36.she denies any decreased in urine output.   Influenza vaccine - states will coming into office for vaccine. She has had no URI's.  Also due for PNA vac vaccine.Will get vaccine on next visit.   Past Medical History:  Diagnosis Date  . Clotting disorder ([place])   . Hyperlipidemia   . Hypertension   . Peripheral vascular disease ([place])    vein surgery, left leg  . Stroke [facility]) 2002   ongoing mild loss of feeling in left side, had to learn to write right-handed  . Substance abuse ([place])   . Tobacco abuse     Past Surgical History:  Procedure Laterality Date  . AORTA - BILATERAL FEMORAL ARTERY BYPASS GRAFT N/A 07/14/2016   Procedure: AORTOBIFEMORAL BYPASS GRAFT;  Surgeon: Waynetta Sandy, MD;  Location: [place];  Service: Vascular;  Laterality: N/A;  . AORTA - BILATERAL FEMORAL ARTERY BYPASS GRAFT Left 10/03/2018   Procedure: THROMBECTOMY OF LEFT LEG, LEFT FEMORAL-POPLITEAL BYPASS GRAFT, RIGHT TO LEFT FEMORAL-FEMORAL BYPASS GRAFT;  Surgeon: Rosetta Posner, MD;  Location: [place];  Service: Vascular;  Laterality: Left;  . CARDIAC CATHETERIZATION N/A 07/11/2016   Procedure: Left  Heart Cath and Coronary Angiography;  Surgeon: Nelva Bush, MD;  Location: [place] [facility];  Service: Cardiovascular;  Laterality: N/A;  . FEMORAL-POPLITEAL BYPASS GRAFT Left 07/14/2016   Procedure: REDO BYPASS GRAFT FEMORAL-POPLITEAL ARTERY;  Surgeon: Waynetta Sandy, MD;  Location: [place];  Service: Vascular;  Laterality: Left;  . PERIPHERAL VASCULAR CATHETERIZATION N/A 07/08/2016   Procedure: Abdominal Aortogram w/ Bilateral Lower Extremity  Runoff;  Surgeon: Elam Dutch, MD;  Location: [place] [facility];  Service: Cardiovascular;  Laterality: N/A;  . VEIN SURGERY Left     Allergies  Allergen Reactions  . Penicillins Swelling    Has patient had a PCN reaction causing immediate rash, facial/tongue/throat swelling, SOB or lightheadedness with hypotension: YES Has patient had a PCN reaction causing severe rash involving mucus membranes or skin necrosis: NO Has patient had a PCN reaction that required hospitalization NO Has patient had a PCN reaction occurring within the last 10 years: NO If all of the above answers are "NO", then may proceed with Cephalosporin use.    Outpatient Encounter Medications as of 06/04/2019  Medication Sig  . aspirin 81 MG EC tablet Take 1 tablet (81 mg total) by mouth daily. Swallow whole.  Marland Kitchen atorvastatin (LIPITOR) 80 MG tablet Take 1 tablet (80 mg total) by mouth daily at 6 PM.  . carvedilol (COREG) 3.125 MG tablet Take 1 tablet (3.125 mg total) by mouth 2 (two) times daily with a meal.  . cloNIDine (CATAPRES) 0.2 MG tablet Take 1 tablet (0.2 mg total) by mouth 2 (two) times daily.  . hydrochlorothiazide (HYDRODIURIL) 25 MG tablet TAKE 1 TABLET BY MOUTH EVERY DAY  . lisinopril (ZESTRIL) 40 MG tablet Take 1 tablet (40 mg total) by mouth daily.  . mineral oil-hydrophilic petrolatum (AQUAPHOR) ointment Apply topically 2 (two) times daily. Apply to lower extremities  . [DISCONTINUED] mineral oil-hydrophilic petrolatum (AQUAPHOR) ointment Apply topically 2 (two) times daily. Apply to lower extremities   No facility-administered encounter medications on file as of 06/04/2019.     Review of Systems:  Review of Systems  Constitutional: Negative for activity change, appetite change, chills, fatigue and fever.  HENT: Negative for congestion, hearing loss, rhinorrhea, sinus pressure, sinus pain, sneezing, sore throat and trouble swallowing.   Eyes: Positive for visual disturbance. Negative for  discharge, redness and itching.       Wears corrective lens   Respiratory: Negative for cough, chest tightness, shortness of breath and wheezing.   Cardiovascular: Negative for chest pain, palpitations and leg swelling.  Gastrointestinal: Negative for abdominal distention, abdominal pain, constipation, diarrhea, nausea and vomiting.  Endocrine: Negative for cold intolerance, heat intolerance, polydipsia, polyphagia and polyuria.  Genitourinary: Negative for decreased urine volume, difficulty urinating, dysuria, flank pain, frequency and urgency.  Musculoskeletal: Positive for gait problem.       Ambulates with a cane   Skin: Negative for color change, pallor and rash.  Neurological: Negative for dizziness, weakness, light-headedness, numbness and headaches.  Hematological: Does not bruise/bleed easily.  Psychiatric/Behavioral: Negative for agitation, behavioral problems and sleep disturbance. The patient is not nervous/anxious.     Health Maintenance  Topic Date Due  . Hepatitis C Screening  15-Jan-1951  . DEXA SCAN  12/20/2016  . PNA vac Low Risk Adult (2 of 2 - PCV13) 04/04/2018  . INFLUENZA VACCINE  04/20/2019  . COLONOSCOPY  09/19/2028 (Originally 12/20/2001)  . TETANUS/TDAP  09/19/2028 (Originally 12/21/1970)  . MAMMOGRAM  11/02/2019    Physical Exam: There were no vitals  filed for this visit. There is no height or weight on file to calculate BMI. Physical Exam Unable to complete on telephone visit.   Labs reviewed: Basic Metabolic Panel: Recent Labs    10/11/18 0345 11/06/18 1018 06/03/19 0927  NA 138 135 136  K 3.6 3.8 4.5  CL 105 100 107  CO2 _0 GLUCOSE 105* 116* 94  BUN 9 19 56*  CREATININE 1.23* 1.36* 2.28*  CALCIUM 8.1* 8.9 9.0   Liver Function Tests: Recent Labs    11/06/18 1018 06/03/19 0927  AST 14 13  ALT 7 5*  BILITOT 0.8 0.5  PROT 7.1 7.5   CBC: Recent Labs    10/03/18 0917  10/11/18 0345 11/06/18 1018 06/03/19 0927  WBC 7.6   < >  8.7 7.0 5.9  NEUTROABS 4.9  --   --  4,494 3,387  HGB 12.3   < > 10.6* 11.5* 9.7*  HCT 36.4   < > 31.5* 33.9* 29.2*  MCV 93.8   < > 90.5 92.1 92.7  PLT 179   < > 266 150 198   < > = values in this interval not displayed.   Lipid Panel: Recent Labs    06/03/19 0927  CHOL 100  HDL 35*  LDLCALC 44  TRIG 127  CHOLHDL 2.9   Lab Results  Component Value Date   HGBA1C 6.1 (H) 06/03/2019    Procedures since last visit: No results found.  Assessment/Plan  1. Dry skin dermatitis Request refill ointment effective.  - mineral oil-hydrophilic petrolatum (AQUAPHOR) ointment; Apply topically 2 (two) times daily. Apply to lower extremities  Dispense: 420 g; Refill: 0  2. Essential hypertension No home B/p log for review. - CBC with Differential/Platelet; Future - CMP with eGFR([facility]); Future - TSH; Future - Lipid panel; Future  3. Hyperlipidemia LDL goal <70 LDL at goal.Decrease atorvastatin 80 mg tablet daily to 40 mg tablet daily to prevent myalgia.encouraged a low carbohydrates,low saturated fats and high vegetable diet and increase physical activity/exercise as tolerated.      4. Prediabetes Hgb A1C encouraged to cut down on cookies and cakes.encouraged to snack on heart healthy snacks such as low sugar fruits.  - Hemoglobin A1c; Future  5. Osteoporosis screening NBot on calcium or vit D will obtain Dexa scan then initiate supplements if needed.No recent fractures or falls.  - HM DEXA SCAN  6. Need for hepatitis C screening test Hepatitis C antibody added to current labs.   7. Chronic kidney disease, stage V (HCC) Worsening CR. - Ambulatory referral to Nephrology - CMP future  Labs/tests ordered:  - CBC with Differential/Platelet; Future - CMP with eGFR([facility]); Future - TSH; Future - Lipid panel; Future - Hemoglobin A1c; Future - HM DEXA SCAN  Next appt: 3 months for medical management of chronic issues.Labs prior to visit.Need PNA vac vaccine.  Spent 25  minutes of non-face to face with patient

## 2019-06-05 LAB — COMPLETE METABOLIC PANEL WITH GFR
AG Ratio: 1 (calc) (ref 1.0–2.5)
ALT: 5 U/L — ABNORMAL LOW (ref 6–29)
AST: 13 U/L (ref 10–35)
Albumin: 3.8 g/dL (ref 3.6–5.1)
Alkaline phosphatase (APISO): 138 U/L (ref 37–153)
BUN/Creatinine Ratio: 25 (calc) — ABNORMAL HIGH (ref 6–22)
BUN: 56 mg/dL — ABNORMAL HIGH (ref 7–25)
CO2: 21 mmol/L (ref 20–32)
Calcium: 9 mg/dL (ref 8.6–10.4)
Chloride: 107 mmol/L (ref 98–110)
Creat: 2.28 mg/dL — ABNORMAL HIGH (ref 0.50–0.99)
GFR, Est African American: 25 mL/min/{1.73_m2} — ABNORMAL LOW (ref >=60)
GFR, Est Non African American: 22 mL/min/{1.73_m2} — ABNORMAL LOW (ref >=60)
Globulin: 3.7 g/dL (calc) (ref 1.9–3.7)
Glucose, Bld: 94 mg/dL (ref 65–99)
Potassium: 4.5 mmol/L (ref 3.5–5.3)
Sodium: 136 mmol/L (ref 135–146)
Total Bilirubin: 0.5 mg/dL (ref 0.2–1.2)
Total Protein: 7.5 g/dL (ref 6.1–8.1)

## 2019-06-05 LAB — HEMOGLOBIN A1C
Hgb A1c MFr Bld: 6.1 % of total Hgb — ABNORMAL HIGH (ref <5.7)
Mean Plasma Glucose: 128 (calc)
eAG (mmol/L): 7.1 (calc)

## 2019-06-05 LAB — LIPID PANEL
Cholesterol: 100 mg/dL (ref <200)
HDL: 35 mg/dL — ABNORMAL LOW (ref >=50)
LDL Cholesterol (Calc): 44 mg/dL (calc)
Non-HDL Cholesterol (Calc): 65 mg/dL (calc) (ref <130)
Total CHOL/HDL Ratio: 2.9 (calc) (ref <5.0)
Triglycerides: 127 mg/dL (ref <150)

## 2019-06-05 LAB — CBC WITH DIFFERENTIAL/PLATELET
Absolute Monocytes: 673 cells/uL (ref 200–950)
Basophils Absolute: 53 cells/uL (ref 0–200)
Basophils Relative: 0.9 %
Eosinophils Absolute: 118 cells/uL (ref 15–500)
Eosinophils Relative: 2 %
HCT: 29.2 % — ABNORMAL LOW (ref 35.0–45.0)
Hemoglobin: 9.7 g/dL — ABNORMAL LOW (ref 11.7–15.5)
Lymphs Abs: 1670 cells/uL (ref 850–3900)
MCH: 30.8 pg (ref 27.0–33.0)
MCHC: 33.2 g/dL (ref 32.0–36.0)
MCV: 92.7 fL (ref 80.0–100.0)
MPV: 10.6 fL (ref 7.5–12.5)
Monocytes Relative: 11.4 %
Neutro Abs: 3387 cells/uL (ref 1500–7800)
Neutrophils Relative %: 57.4 %
Platelets: 198 10*3/uL (ref 140–400)
RBC: 3.15 10*6/uL — ABNORMAL LOW (ref 3.80–5.10)
RDW: 13.1 % (ref 11.0–15.0)
Total Lymphocyte: 28.3 %
WBC: 5.9 10*3/uL (ref 3.8–10.8)

## 2019-06-05 LAB — HEPATITIS C ANTIBODY
Hepatitis C Ab: NONREACTIVE
SIGNAL TO CUT-OFF: 0.37 (ref <1.00)

## 2019-06-05 LAB — TEST AUTHORIZATION

## 2019-06-07 DIAGNOSIS — R531 Weakness: Secondary | ICD-10-CM | POA: Diagnosis not present

## 2019-06-07 DIAGNOSIS — Z95820 Peripheral vascular angioplasty status with implants and grafts: Secondary | ICD-10-CM | POA: Diagnosis not present

## 2019-06-25 ENCOUNTER — Emergency Department (HOSPITAL_COMMUNITY): Payer: Medicare HMO

## 2019-06-25 ENCOUNTER — Encounter (HOSPITAL_COMMUNITY): Payer: Self-pay | Admitting: Emergency Medicine

## 2019-06-25 ENCOUNTER — Other Ambulatory Visit: Payer: Self-pay

## 2019-06-25 ENCOUNTER — Inpatient Hospital Stay (HOSPITAL_COMMUNITY)
Admission: EM | Admit: 2019-06-25 | Discharge: 2019-07-08 | DRG: 240 | Disposition: A | Discharge status: Home Health | Payer: Medicare HMO | Attending: Internal Medicine | Admitting: Internal Medicine

## 2019-06-25 DIAGNOSIS — I10 Essential (primary) hypertension: Secondary | ICD-10-CM | POA: Diagnosis not present

## 2019-06-25 DIAGNOSIS — L089 Local infection of the skin and subcutaneous tissue, unspecified: Secondary | ICD-10-CM | POA: Diagnosis not present

## 2019-06-25 DIAGNOSIS — R6521 Severe sepsis with septic shock: Secondary | ICD-10-CM | POA: Diagnosis not present

## 2019-06-25 DIAGNOSIS — Z880 Allergy status to penicillin: Secondary | ICD-10-CM

## 2019-06-25 DIAGNOSIS — E871 Hypo-osmolality and hyponatremia: Secondary | ICD-10-CM | POA: Diagnosis present

## 2019-06-25 DIAGNOSIS — R410 Disorientation, unspecified: Secondary | ICD-10-CM | POA: Diagnosis not present

## 2019-06-25 DIAGNOSIS — L853 Xerosis cutis: Secondary | ICD-10-CM | POA: Diagnosis present

## 2019-06-25 DIAGNOSIS — M24562 Contracture, left knee: Secondary | ICD-10-CM | POA: Diagnosis present

## 2019-06-25 DIAGNOSIS — R609 Edema, unspecified: Secondary | ICD-10-CM | POA: Diagnosis not present

## 2019-06-25 DIAGNOSIS — R7303 Prediabetes: Secondary | ICD-10-CM | POA: Diagnosis present

## 2019-06-25 DIAGNOSIS — I959 Hypotension, unspecified: Secondary | ICD-10-CM | POA: Diagnosis not present

## 2019-06-25 DIAGNOSIS — R1110 Vomiting, unspecified: Secondary | ICD-10-CM | POA: Diagnosis present

## 2019-06-25 DIAGNOSIS — I129 Hypertensive chronic kidney disease with stage 1 through stage 4 chronic kidney disease, or unspecified chronic kidney disease: Secondary | ICD-10-CM | POA: Diagnosis not present

## 2019-06-25 DIAGNOSIS — N182 Chronic kidney disease, stage 2 (mild): Secondary | ICD-10-CM | POA: Diagnosis present

## 2019-06-25 DIAGNOSIS — M79672 Pain in left foot: Secondary | ICD-10-CM | POA: Diagnosis not present

## 2019-06-25 DIAGNOSIS — Y712 Prosthetic and other implants, materials and accessory cardiovascular devices associated with adverse incidents: Secondary | ICD-10-CM | POA: Diagnosis present

## 2019-06-25 DIAGNOSIS — L97929 Non-pressure chronic ulcer of unspecified part of left lower leg with unspecified severity: Secondary | ICD-10-CM | POA: Diagnosis not present

## 2019-06-25 DIAGNOSIS — E8809 Other disorders of plasma-protein metabolism, not elsewhere classified: Secondary | ICD-10-CM | POA: Diagnosis present

## 2019-06-25 DIAGNOSIS — E7800 Pure hypercholesterolemia, unspecified: Secondary | ICD-10-CM | POA: Diagnosis present

## 2019-06-25 DIAGNOSIS — Y832 Surgical operation with anastomosis, bypass or graft as the cause of abnormal reaction of the patient, or of later complication, without mention of misadventure at the time of the procedure: Secondary | ICD-10-CM | POA: Diagnosis not present

## 2019-06-25 DIAGNOSIS — A419 Sepsis, unspecified organism: Secondary | ICD-10-CM | POA: Diagnosis not present

## 2019-06-25 DIAGNOSIS — N189 Chronic kidney disease, unspecified: Secondary | ICD-10-CM

## 2019-06-25 DIAGNOSIS — E785 Hyperlipidemia, unspecified: Secondary | ICD-10-CM | POA: Diagnosis present

## 2019-06-25 DIAGNOSIS — N183 Chronic kidney disease, stage 3 unspecified: Secondary | ICD-10-CM

## 2019-06-25 DIAGNOSIS — I69354 Hemiplegia and hemiparesis following cerebral infarction affecting left non-dominant side: Secondary | ICD-10-CM | POA: Diagnosis not present

## 2019-06-25 DIAGNOSIS — I96 Gangrene, not elsewhere classified: Secondary | ICD-10-CM | POA: Diagnosis not present

## 2019-06-25 DIAGNOSIS — Z87891 Personal history of nicotine dependence: Secondary | ICD-10-CM

## 2019-06-25 DIAGNOSIS — I471 Supraventricular tachycardia: Secondary | ICD-10-CM | POA: Diagnosis not present

## 2019-06-25 DIAGNOSIS — R52 Pain, unspecified: Secondary | ICD-10-CM | POA: Diagnosis not present

## 2019-06-25 DIAGNOSIS — E860 Dehydration: Secondary | ICD-10-CM

## 2019-06-25 DIAGNOSIS — Z8249 Family history of ischemic heart disease and other diseases of the circulatory system: Secondary | ICD-10-CM

## 2019-06-25 DIAGNOSIS — E876 Hypokalemia: Secondary | ICD-10-CM | POA: Diagnosis present

## 2019-06-25 DIAGNOSIS — I70262 Atherosclerosis of native arteries of extremities with gangrene, left leg: Secondary | ICD-10-CM | POA: Diagnosis not present

## 2019-06-25 DIAGNOSIS — M79609 Pain in unspecified limb: Secondary | ICD-10-CM

## 2019-06-25 DIAGNOSIS — T82898A Other specified complication of vascular prosthetic devices, implants and grafts, initial encounter: Secondary | ICD-10-CM

## 2019-06-25 DIAGNOSIS — Z79899 Other long term (current) drug therapy: Secondary | ICD-10-CM

## 2019-06-25 DIAGNOSIS — T82868A Thrombosis of vascular prosthetic devices, implants and grafts, initial encounter: Principal | ICD-10-CM | POA: Diagnosis present

## 2019-06-25 DIAGNOSIS — L97529 Non-pressure chronic ulcer of other part of left foot with unspecified severity: Secondary | ICD-10-CM | POA: Diagnosis present

## 2019-06-25 DIAGNOSIS — Z20828 Contact with and (suspected) exposure to other viral communicable diseases: Secondary | ICD-10-CM | POA: Diagnosis present

## 2019-06-25 DIAGNOSIS — Z7982 Long term (current) use of aspirin: Secondary | ICD-10-CM

## 2019-06-25 DIAGNOSIS — L97829 Non-pressure chronic ulcer of other part of left lower leg with unspecified severity: Secondary | ICD-10-CM | POA: Diagnosis not present

## 2019-06-25 DIAGNOSIS — I739 Peripheral vascular disease, unspecified: Secondary | ICD-10-CM | POA: Diagnosis not present

## 2019-06-25 DIAGNOSIS — R531 Weakness: Secondary | ICD-10-CM | POA: Diagnosis not present

## 2019-06-25 DIAGNOSIS — M79605 Pain in left leg: Secondary | ICD-10-CM | POA: Diagnosis not present

## 2019-06-25 DIAGNOSIS — I2510 Atherosclerotic heart disease of native coronary artery without angina pectoris: Secondary | ICD-10-CM | POA: Diagnosis present

## 2019-06-25 DIAGNOSIS — M79662 Pain in left lower leg: Secondary | ICD-10-CM | POA: Diagnosis not present

## 2019-06-25 DIAGNOSIS — D631 Anemia in chronic kidney disease: Secondary | ICD-10-CM | POA: Diagnosis not present

## 2019-06-25 DIAGNOSIS — I998 Other disorder of circulatory system: Secondary | ICD-10-CM

## 2019-06-25 DIAGNOSIS — N179 Acute kidney failure, unspecified: Secondary | ICD-10-CM

## 2019-06-25 DIAGNOSIS — M25572 Pain in left ankle and joints of left foot: Secondary | ICD-10-CM | POA: Diagnosis not present

## 2019-06-25 DIAGNOSIS — D62 Acute posthemorrhagic anemia: Secondary | ICD-10-CM | POA: Diagnosis not present

## 2019-06-25 DIAGNOSIS — Z95820 Peripheral vascular angioplasty status with implants and grafts: Secondary | ICD-10-CM | POA: Diagnosis not present

## 2019-06-25 LAB — COMPREHENSIVE METABOLIC PANEL
ALT: 12 U/L (ref 0–44)
AST: 27 U/L (ref 15–41)
Albumin: 3.5 g/dL (ref 3.5–5.0)
Alkaline Phosphatase: 97 U/L (ref 38–126)
Anion gap: 14 (ref 5–15)
BUN: 54 mg/dL — ABNORMAL HIGH (ref 8–23)
CO2: 24 mmol/L (ref 22–32)
Calcium: 8.5 mg/dL — ABNORMAL LOW (ref 8.9–10.3)
Chloride: 91 mmol/L — ABNORMAL LOW (ref 98–111)
Creatinine, Ser: 3.77 mg/dL — ABNORMAL HIGH (ref 0.44–1.00)
GFR calc Af Amer: 14 mL/min — ABNORMAL LOW (ref >60)
GFR calc non Af Amer: 12 mL/min — ABNORMAL LOW (ref >60)
Glucose, Bld: 116 mg/dL — ABNORMAL HIGH (ref 70–99)
Potassium: 2.5 mmol/L — CL (ref 3.5–5.1)
Sodium: 129 mmol/L — ABNORMAL LOW (ref 135–145)
Total Bilirubin: 1.5 mg/dL — ABNORMAL HIGH (ref 0.3–1.2)
Total Protein: 7.9 g/dL (ref 6.5–8.1)

## 2019-06-25 LAB — CBC WITH DIFFERENTIAL/PLATELET
Abs Immature Granulocytes: 0.14 10*3/uL — ABNORMAL HIGH (ref 0.00–0.07)
Basophils Absolute: 0 10*3/uL (ref 0.0–0.1)
Basophils Relative: 0 %
Eosinophils Absolute: 0 10*3/uL (ref 0.0–0.5)
Eosinophils Relative: 0 %
HCT: 29.2 % — ABNORMAL LOW (ref 36.0–46.0)
Hemoglobin: 9.6 g/dL — ABNORMAL LOW (ref 12.0–15.0)
Immature Granulocytes: 1 %
Lymphocytes Relative: 8 %
Lymphs Abs: 1.5 10*3/uL (ref 0.7–4.0)
MCH: 30.6 pg (ref 26.0–34.0)
MCHC: 32.9 g/dL (ref 30.0–36.0)
MCV: 93 fL (ref 80.0–100.0)
Monocytes Absolute: 1.7 10*3/uL — ABNORMAL HIGH (ref 0.1–1.0)
Monocytes Relative: 10 %
Neutro Abs: 14.1 10*3/uL — ABNORMAL HIGH (ref 1.7–7.7)
Neutrophils Relative %: 81 %
Platelets: 377 10*3/uL (ref 150–400)
RBC: 3.14 MIL/uL — ABNORMAL LOW (ref 3.87–5.11)
RDW: 13.7 % (ref 11.5–15.5)
WBC: 17.5 10*3/uL — ABNORMAL HIGH (ref 4.0–10.5)
nRBC: 0 % (ref 0.0–0.2)

## 2019-06-25 LAB — LACTIC ACID, PLASMA
Lactic Acid, Venous: 1.7 mmol/L (ref 0.5–1.9)
Lactic Acid, Venous: 1.6 mmol/L (ref 0.5–1.9)

## 2019-06-25 LAB — SARS CORONAVIRUS 2 BY RT PCR (HOSPITAL ORDER, PERFORMED IN ~~LOC~~ HOSPITAL LAB): SARS Coronavirus 2: NEGATIVE

## 2019-06-25 LAB — SEDIMENTATION RATE: Sed Rate: 89 mm/hr — ABNORMAL HIGH (ref 0–22)

## 2019-06-25 LAB — C-REACTIVE PROTEIN: CRP: 23.9 mg/dL — ABNORMAL HIGH (ref <1.0)

## 2019-06-25 MED ORDER — SODIUM CHLORIDE 0.9 % IV BOLUS
1000.0000 mL | Freq: Once | INTRAVENOUS | Status: AC
  Administered 2019-06-25: 1000 mL via INTRAVENOUS

## 2019-06-25 MED ORDER — SODIUM CHLORIDE 0.9 % IV SOLN
1.0000 g | Freq: Three times a day (TID) | INTRAVENOUS | Status: DC
  Administered 2019-06-26 (×2): 1 g via INTRAVENOUS
  Filled 2019-06-25 (×5): qty 1

## 2019-06-25 MED ORDER — POTASSIUM CHLORIDE 10 MEQ/100ML IV SOLN
10.0000 meq | Freq: Once | INTRAVENOUS | Status: AC
  Administered 2019-06-25: 10 meq via INTRAVENOUS
  Filled 2019-06-25: qty 100

## 2019-06-25 MED ORDER — POTASSIUM CHLORIDE 10 MEQ/100ML IV SOLN
10.0000 meq | INTRAVENOUS | Status: AC
  Administered 2019-06-25 – 2019-06-26 (×3): 10 meq via INTRAVENOUS
  Filled 2019-06-25 (×3): qty 100

## 2019-06-25 MED ORDER — MAGNESIUM SULFATE 2 GM/50ML IV SOLN
2.0000 g | Freq: Once | INTRAVENOUS | Status: AC
  Administered 2019-06-25: 2 g via INTRAVENOUS
  Filled 2019-06-25: qty 50

## 2019-06-25 MED ORDER — SODIUM CHLORIDE 0.9 % IV SOLN
2.0000 g | Freq: Once | INTRAVENOUS | Status: AC
  Administered 2019-06-25: 2 g via INTRAVENOUS
  Filled 2019-06-25: qty 2

## 2019-06-25 MED ORDER — HEPARIN BOLUS VIA INFUSION
2000.0000 [IU] | Freq: Once | INTRAVENOUS | Status: AC
  Administered 2019-06-25: 2000 [IU] via INTRAVENOUS
  Filled 2019-06-25: qty 2000

## 2019-06-25 MED ORDER — ACETAMINOPHEN 325 MG PO TABS
650.0000 mg | ORAL_TABLET | Freq: Four times a day (QID) | ORAL | Status: DC | PRN
  Administered 2019-07-04 – 2019-07-05 (×3): 650 mg via ORAL
  Filled 2019-06-25 (×3): qty 2

## 2019-06-25 MED ORDER — MORPHINE SULFATE (PF) 4 MG/ML IV SOLN
4.0000 mg | Freq: Once | INTRAVENOUS | Status: AC
  Administered 2019-06-25: 4 mg via INTRAVENOUS
  Filled 2019-06-25: qty 1

## 2019-06-25 MED ORDER — ONDANSETRON HCL 4 MG PO TABS: 4.0000 mg | ORAL_TABLET | Freq: Four times a day (QID) | ORAL | Status: DC | PRN

## 2019-06-25 MED ORDER — ACETAMINOPHEN 650 MG RE SUPP: 650.0000 mg | Freq: Four times a day (QID) | RECTAL | Status: DC | PRN

## 2019-06-25 MED ORDER — SODIUM CHLORIDE 0.9 % IV SOLN
INTRAVENOUS | Status: DC
  Administered 2019-06-25 – 2019-06-26 (×3): via INTRAVENOUS

## 2019-06-25 MED ORDER — VANCOMYCIN HCL IN DEXTROSE 750-5 MG/150ML-% IV SOLN
750.0000 mg | INTRAVENOUS | Status: DC
  Filled 2019-06-25: qty 150

## 2019-06-25 MED ORDER — SODIUM CHLORIDE 0.9% FLUSH
3.0000 mL | Freq: Two times a day (BID) | INTRAVENOUS | Status: DC
  Administered 2019-06-26 – 2019-07-08 (×11): 3 mL via INTRAVENOUS

## 2019-06-25 MED ORDER — HEPARIN (PORCINE) 25000 UT/250ML-% IV SOLN
800.0000 [IU]/h | INTRAVENOUS | Status: DC
  Administered 2019-06-25: 1050 [IU]/h via INTRAVENOUS
  Administered 2019-06-26: 800 [IU]/h via INTRAVENOUS
  Filled 2019-06-25 (×3): qty 250

## 2019-06-25 MED ORDER — ASPIRIN EC 81 MG PO TBEC
81.0000 mg | DELAYED_RELEASE_TABLET | Freq: Every day | ORAL | Status: DC
  Administered 2019-06-26 – 2019-07-08 (×12): 81 mg via ORAL
  Filled 2019-06-25 (×14): qty 1

## 2019-06-25 MED ORDER — VANCOMYCIN HCL IN DEXTROSE 1-5 GM/200ML-% IV SOLN
1000.0000 mg | Freq: Once | INTRAVENOUS | Status: AC
  Administered 2019-06-25: 1000 mg via INTRAVENOUS
  Filled 2019-06-25: qty 200

## 2019-06-25 MED ORDER — ONDANSETRON HCL 4 MG/2ML IJ SOLN: 4.0000 mg | Freq: Four times a day (QID) | INTRAMUSCULAR | Status: DC | PRN

## 2019-06-25 MED ORDER — MORPHINE SULFATE (PF) 2 MG/ML IV SOLN
2.0000 mg | INTRAVENOUS | Status: DC | PRN
  Administered 2019-06-25 (×2): 2 mg via INTRAVENOUS
  Administered 2019-06-26: 4 mg via INTRAVENOUS
  Filled 2019-06-25: qty 1
  Filled 2019-06-25: qty 2
  Filled 2019-06-25: qty 1

## 2019-06-25 NOTE — ED Provider Notes (Signed)
[facility] DEPT Provider Note   CSN: [id number] Arrival date & time: 06/25/19  1346     History   Chief Complaint Chief Complaint  Patient presents with   Wound Infection    HPI Wendy Townsend is a 68 y.o. female with PMHx HTN, HLD, peripheral vascular disease who presents to the ED today for questionable wound infection to her left lower leg/foot.  Patient she reports that she has been dealing with dry skin on her left leg since "COVID started."  Reports that for the last 1 to 2 weeks it has gotten worse and started using.  She also complains of pain to the area.  Per triage report patient was found to have a fever on EMS at 101.2.  Rectally in the ED it is 99.9.  Pt did not know she had a fever. She denies chills or any other associated symptoms.   Per chart review pt had thrombectomy of left leg with left fem pop bypass graft on 10/03/18 by Dr. Donnetta Hutching with vascular surgery after she was admitted to the hospital for nontraumatic ischemic infarction of the muscle of the left lower leg with gangrene of the toe of the left foot.   Pt recently seen by PCP on 09/15 and was being treated for "dry skin dermatitis" with aquaphor cream.        Past Medical History:  Diagnosis Date   Clotting disorder ([place])    Hyperlipidemia    Hypertension    Peripheral vascular disease ([place])    vein surgery, left leg   Stroke ([place]) 2002   ongoing mild loss of feeling in left side, had to learn to write right-handed   Substance abuse ([place])    Tobacco abuse     Patient Active Problem List   Diagnosis Date Noted   Left foot pain 06/25/2019   AKI (acute kidney injury) ([place]) 06/25/2019   Dehydration 06/25/2019   CKD (chronic kidney disease), stage III 06/25/2019   Anemia in chronic kidney disease (CKD) 06/25/2019   History of tobacco abuse 08/10/2016   Debilitated 07/20/2016   Anemia of chronic disease    Prediabetes    Tobacco abuse     Elevated bilirubin    History of CVA (cerebrovascular accident)    Benign essential HTN    Diastolic dysfunction    Coronary artery disease involving native coronary artery of native heart without angina pectoris    Post-operative pain    Acute blood loss anemia    Diarrhea    Hypophosphatemia    Hypomagnesemia    Hypoalbuminemia due to protein-calorie malnutrition (HCC)    Lymphocytosis    Encounter for rehabilitation 07/18/2016   Status post aortobifemoral bypass surgery    Acute respiratory failure with hypoxia ([place])    Acute renal failure superimposed on stage 3 chronic kidney disease ([place]) 07/13/2016   Essential hypertension 07/13/2016   Impaired glucose tolerance 07/13/2016   Ischemic pain of foot, left    Abnormal stress test    Dyspnea    Dry gangrene ([place]) 07/06/2016   PVD (peripheral vascular disease) ([place]) 07/06/2016   Hyperglycemia 07/06/2016   Acute kidney injury superimposed on chronic kidney disease ([place]) 07/06/2016   Protein-calorie malnutrition, severe 07/06/2016   Ischemic pain of left foot 07/05/2016   History of stroke 06/08/2016   Left hemiparesis ([place]) 06/08/2016   Numbness and tingling of left leg 06/08/2016   Tobacco use disorder 06/08/2016   Hypertension 06/28/2011   Hyperlipidemia 06/28/2011  PAD (peripheral artery disease) ([place]) 06/28/2011    Past Surgical History:  Procedure Laterality Date   AORTA - BILATERAL FEMORAL ARTERY BYPASS GRAFT N/A 07/14/2016   Procedure: AORTOBIFEMORAL BYPASS GRAFT;  Surgeon: Waynetta Sandy, MD;  Location: [facility];  Service: Vascular;  Laterality: N/A;   AORTA - BILATERAL FEMORAL ARTERY BYPASS GRAFT Left 10/03/2018   Procedure: THROMBECTOMY OF LEFT LEG, LEFT FEMORAL-POPLITEAL BYPASS GRAFT, RIGHT TO LEFT FEMORAL-FEMORAL BYPASS GRAFT;  Surgeon: Rosetta Posner, MD;  Location: [facility];  Service: Vascular;  Laterality: Left;   CARDIAC CATHETERIZATION N/A 07/11/2016   Procedure: Left  Heart Cath and Coronary Angiography;  Surgeon: Nelva Bush, MD;  Location: [facility];  Service: Cardiovascular;  Laterality: N/A;   FEMORAL-POPLITEAL BYPASS GRAFT Left 07/14/2016   Procedure: REDO BYPASS GRAFT FEMORAL-POPLITEAL ARTERY;  Surgeon: Waynetta Sandy, MD;  Location: [facility];  Service: Vascular;  Laterality: Left;   PERIPHERAL VASCULAR CATHETERIZATION N/A 07/08/2016   Procedure: Abdominal Aortogram w/ Bilateral Lower Extremity Runoff;  Surgeon: Elam Dutch, MD;  Location: [facility];  Service: Cardiovascular;  Laterality: N/A;   VEIN SURGERY Left      OB History   No obstetric history on file.      Home Medications    Prior to Admission medications   Medication Sig Start Date End Date Taking? Authorizing Provider  aspirin 81 MG EC tablet Take 1 tablet (81 mg total) by mouth daily. Swallow whole. 03/18/19  Yes Ngetich, Dinah C, NP  atorvastatin (LIPITOR) 80 MG tablet Take 1 tablet (80 mg total) by mouth daily at 6 PM. 03/20/19  Yes Nahser, Wonda Cheng, MD  carvedilol (COREG) 3.125 MG tablet Take 1 tablet (3.125 mg total) by mouth 2 (two) times daily with a meal. 01/30/19  Yes Ngetich, Dinah C, NP  cloNIDine (CATAPRES) 0.2 MG tablet Take 1 tablet (0.2 mg total) by mouth 2 (two) times daily. 01/30/19  Yes Ngetich, Dinah C, NP  hydrochlorothiazide (HYDRODIURIL) 25 MG tablet TAKE 1 TABLET BY MOUTH EVERY DAY Patient taking differently: Take 25 mg by mouth daily.  02/21/19  Yes Nahser, Wonda Cheng, MD  lisinopril (ZESTRIL) 40 MG tablet Take 1 tablet (40 mg total) by mouth daily. 01/30/19  Yes Ngetich, Dinah C, NP  mineral oil-hydrophilic petrolatum (AQUAPHOR) ointment Apply topically 2 (two) times daily. Apply to lower extremities 06/04/19   Ngetich, Nelda Bucks, NP    Family History Family History  Problem Relation Age of Onset   Hypertension Mother 55   Cancer Brother    Cancer Sister    Colon cancer Neg Hx    Colon polyps Neg Hx    Esophageal cancer  Neg Hx    Rectal cancer Neg Hx    Stomach cancer Neg Hx     Social History Social History   Tobacco Use   Smoking status: Former Smoker    Years: 40.00    Types: Cigarettes    Quit date: 05/2017    Years since quitting: 2.0   Smokeless tobacco: Never Used   Tobacco comment: 10/04/2018 "stopped for awhile then started back"  Substance Use Topics   Alcohol use: Not Currently    Comment: 2-3 times a year   Drug use: Yes    Types: Marijuana    Comment: when she can't sleep sometimes at night     Allergies   Penicillins   Review of Systems Review of Systems  Constitutional: Positive for fever.  Musculoskeletal: Positive for arthralgias.  Skin: Positive for wound.  All other systems reviewed and are negative.    Physical Exam Updated Vital Signs BP (!) 92/49    Pulse 77    Temp 98.2 F (36.8 C) (Oral)    Resp 17    SpO2 100%   Physical Exam Vitals signs and nursing note reviewed.  Constitutional:      Appearance: She is not ill-appearing.  HENT:     Head: Normocephalic and atraumatic.  Eyes:     Conjunctiva/sclera: Conjunctivae normal.  Neck:     Musculoskeletal: Neck supple.  Cardiovascular:     Rate and Rhythm: Normal rate and regular rhythm.     Pulses: Normal pulses.  Pulmonary:     Effort: Pulmonary effort is normal.     Breath sounds: Normal breath sounds. No wheezing, rhonchi or rales.  Abdominal:     Palpations: Abdomen is soft.     Tenderness: There is no abdominal tenderness. There is no guarding or rebound.  Musculoskeletal:     Comments: See photos below. Diffuse dry skin to left lower extremity; ttp to foot, ankle, and left mid shin. Unable to palpate DP and PT pulse. Unable to obtain dopplerable pulses bilaterally including DP, PT, and popliteal. 2+ femoral pulse on left side.   Skin:    General: Skin is warm and dry.  Neurological:     Mental Status: She is alert.            ED Treatments / Results  Labs (all labs ordered  are listed, but only abnormal results are displayed) Labs Reviewed  COMPREHENSIVE METABOLIC PANEL - Abnormal; Notable for the following components:      Result Value   Sodium 129 (*)    Potassium 2.5 (*)    Chloride 91 (*)    Glucose, Bld 116 (*)    BUN 54 (*)    Creatinine, Ser 3.77 (*)    Calcium 8.5 (*)    Total Bilirubin 1.5 (*)    GFR calc non Af Amer 12 (*)    GFR calc Af Amer 14 (*)    All other components within normal limits  CBC WITH DIFFERENTIAL/PLATELET - Abnormal; Notable for the following components:   WBC 17.5 (*)    RBC 3.14 (*)    Hemoglobin 9.6 (*)    HCT 29.2 (*)    Neutro Abs 14.1 (*)    Monocytes Absolute 1.7 (*)    Abs Immature Granulocytes 0.14 (*)    All other components within normal limits  SEDIMENTATION RATE - Abnormal; Notable for the following components:   Sed Rate 89 (*)    All other components within normal limits  C-REACTIVE PROTEIN - Abnormal; Notable for the following components:   CRP 23.9 (*)    All other components within normal limits  SARS CORONAVIRUS 2 (HOSPITAL ORDER, [facility])  LACTIC ACID, PLASMA  LACTIC ACID, PLASMA  URINALYSIS, ROUTINE W REFLEX MICROSCOPIC  CBC  HEPARIN LEVEL (UNFRACTIONATED)    EKG None  Radiology Dg Tibia/fibula Left  Result Date: 06/25/2019 CLINICAL DATA:  Pt with pain to lower extremity. Pt skin is weeping and flaking off in some areas from just below knee down to mid left foot. States she has a history of skin grafts to lower leg EXAM: LEFT TIBIA AND FIBULA - 2 VIEW COMPARISON:  None. FINDINGS: No fracture or bone lesion. Skeletal structures are demineralized. Knee and ankle joints are normally aligned. There are soft tissue defects  along the anterolateral aspect of the ankle and lower leg. No radiopaque foreign body. There scattered vascular calcifications. IMPRESSION: 1. No fracture, bone lesion or evidence of osteomyelitis. Knee and ankle joints normally aligned. 2.  Superficial soft tissue defects along the anterolateral ankle and lower leg. Electronically Signed   By: Lajean Manes M.D.   On: 06/25/2019 15:51   Dg Ankle Complete Left  Result Date: 06/25/2019 CLINICAL DATA:  Pt with pain to lower extremity. Pt skin is weeping and flaking off in some areas from just below knee down to mid left foot. States she has a history of skin grafts to lower leg EXAM: LEFT ANKLE COMPLETE - 3+ VIEW COMPARISON:  None. FINDINGS: No fracture or bone lesion. Skeletal structures are diffusely demineralized. No bone resorption to suggest osteomyelitis. Ankle joint normally spaced and aligned.  No arthropathic changes. They were are superficial soft tissue defects along the anterolateral aspect of the ankle. No soft tissue air. No radiopaque foreign body. IMPRESSION: 1. No fracture, bone lesion or evidence of osteomyelitis. 2. Superficial to soft tissue defects along the anterolateral ankle. Electronically Signed   By: Lajean Manes M.D.   On: 06/25/2019 15:50   Dg Foot Complete Left  Result Date: 06/25/2019 CLINICAL DATA:  Pt with pain to lower extremity. Pt skin is weeping and flaking off in some areas from just below knee down to mid left foot. States she has a history of skin grafts to lower leg EXAM: LEFT FOOT - COMPLETE 3+ VIEW COMPARISON:  None. FINDINGS: No fracture or bone lesion. Skeletal structures are diffusely demineralized. Absent distal phalanges of the second and fifth toes with small tapering middle phalanges. Findings consistent with previous distal toe amputations. There are no areas of bone resorption to suggest osteomyelitis. Joints are normally spaced and aligned. Soft tissues are unremarkable. IMPRESSION: 1. No fracture or acute finding.  No evidence of osteomyelitis. Electronically Signed   By: Lajean Manes M.D.   On: 06/25/2019 15:48   Vas [place] Lower Extremity Bypass Graft Dupl  Result Date: 06/25/2019 LOWER EXTREMITY ARTERIAL DUPLEX STUDY Indications: Gangrene  to left foot and tibial area, and peripheral artery              disease.  Current ABI: Unable to obtain secondary to wounds Limitations: Patient unable to position properly secondary to pain Comparison Study: 10/04/2018 ABI- right =0.65, left= 0.65 Performing Technologist: Maudry Mayhew MHA, RDMS, RVT, RDCS  Examination Guidelines: A complete evaluation includes B-mode imaging, spectral Doppler, color Doppler, and power Doppler as needed of all accessible portions of each vessel. Bilateral testing is considered an integral part of a complete examination. Limited examinations for reoccurring indications may be performed as noted.  Right Graft #1: Right to left femoral-femoral bypass graft +------------------+--------+--------+----------+-------------------+                     PSV cm/s Stenosis Waveform   Comments             +------------------+--------+--------+----------+-------------------+  Inflow                                          Unable to visualize  +------------------+--------+--------+----------+-------------------+  Prox Anastomosis   45                monophasic                      +------------------+--------+--------+----------+-------------------+  Proximal Graft     72                monophasic                      +------------------+--------+--------+----------+-------------------+  Mid Graft          36                monophasic                      +------------------+--------+--------+----------+-------------------+  Distal Graft       52                monophasic                      +------------------+--------+--------+----------+-------------------+  Distal Anastomosis 188               monophasic                      +------------------+--------+--------+----------+-------------------+  Outflow                                         Unable to visualize  +------------------+--------+--------+----------+-------------------+  Left Graft #1: Left femoral-popliteal bypass graft  +--------------------+--------+--------+----------+----------------------------+                       PSV cm/s Stenosis Waveform   Comments                      +--------------------+--------+--------+----------+----------------------------+  Inflow                                            Unable to visualize           +--------------------+--------+--------+----------+----------------------------+  Proximal Anastomosis 16                monophasic                               +--------------------+--------+--------+----------+----------------------------+  Proximal Graft       47                monophasic Occluded just distal to                                                          proximal segment.             +--------------------+--------+--------+----------+----------------------------+  Mid Graft                     occluded                                          +--------------------+--------+--------+----------+----------------------------+  Distal Graft                  occluded                                          +--------------------+--------+--------+----------+----------------------------+  Distal Anastomosis            occluded                                          +--------------------+--------+--------+----------+----------------------------+  Outflow                                           Unable to visualize           +--------------------+--------+--------+----------+----------------------------+  Summary: Right: Patent right to left femoral-femoral bypass graft. Left: Color Doppler and pulsed wave Doppler suggest occluded left femoral-popliteal bypass graft at the proximal thigh. Multiple heterogenous areas visualized in the left groin, suggestive of prominent inguinal lymph nodes.  See table(s) above for measurements and observations.    Preliminary     Procedures .Critical Care Performed by: Eustaquio Maize, PA-C Authorized by: Eustaquio Maize, PA-C   Critical care  provider statement:    Critical care time (minutes):  45   Critical care was necessary to treat or prevent imminent or life-threatening deterioration of the following conditions:  Sepsis and circulatory failure   Critical care was time spent personally by me on the following activities:  Discussions with consultants, evaluation of patient's response to treatment, examination of patient, ordering and performing treatments and interventions, ordering and review of laboratory studies, ordering and review of radiographic studies, pulse oximetry, re-evaluation of patient's condition, obtaining history from patient or surrogate and review of old charts   (including critical care time)  Medications Ordered in ED Medications  heparin ADULT infusion 100 units/mL (25000 units/245mL sodium chloride 0.45%) (1,050 Units/hr Intravenous New Bag/Given 06/25/19 1700)  aztreonam (AZACTAM) 2 g in sodium chloride 0.9 % 100 mL IVPB (has no administration in time range)  sodium chloride 0.9 % bolus 1,000 mL (0 mLs Intravenous Stopped 06/25/19 1638)  vancomycin (VANCOCIN) IVPB 1000 mg/200 mL premix (0 mg Intravenous Stopped 06/25/19 1604)  morphine 4 MG/ML injection 4 mg (4 mg Intravenous Given 06/25/19 1532)  potassium chloride 10 mEq in 100 mL IVPB (0 mEq Intravenous Stopped 06/25/19 1744)  heparin bolus via infusion 2,000 Units (2,000 Units Intravenous Bolus from Bag 06/25/19 1701)     Initial Impression / Assessment and Plan / ED Course  I have reviewed the triage vital signs and the nursing notes.  Pertinent labs & imaging results that were available during my care of the patient were reviewed by me and considered in my medical decision making (see chart for details).  68 year old female who presents to the ED today complaining of worsening pain to her left leg.  Does have history of fem pop graft done in Jan 2020 by Dr. Donnetta Hutching.  Has been seen by her PCP recently for what appeared to be a dry skin dermatitis treated  with Aquaphor.  On arrival patient was found to be febrile at 101.2 by EMS.  Rectally here she is 99.9.  Her blood pressures are quite soft.  There is concern for infection at this time as well as concern for possible arterial occlusion.  I am unable to Doppler any popliteal, DP, PT pulses on the left side.  Attending physician Dr. Roslynn Amble attempted without success.  We were unable to clear any on the right side as well.  Sepsis work-up initiated at this time.  Patient started on vancomycin.   Leukocytosis present at 17,000.  Anion also elevated from baseline.  Potassium 2.5.  Will replete this in the ED.  CRP and sed rate quite elevated as well.   Vascular ultrasound of the graft does show occlusion on the left side.  X-rays negative for osteo-.  Will consult vascular surgery at this time.   Discussed case with surgery who suggest transferring to [facility] with medicine admission at this time.  They have recommended starting heparin. I discussed case with Dr. Sarajane Jews who will consult vascular to see if they want to direct admit or if this needs to be a hospitalist admission.   Clinical Course as of Jun 24 1826  Tue Jun 25, 2019  1604 Discussed case with vascular surgeon Dr. Trula Slade - recommends transferring to [facility]   [MV]    Clinical Course User Index [MV] Eustaquio Maize, PA-C         Final Clinical Impressions(s) / ED Diagnoses   Final diagnoses:  Left leg pain  Occlusion of arterial bypass graft, initial encounter ([place])  Hypokalemia  AKI (acute kidney injury) ([place])  Sepsis with acute renal failure, due to unspecified organism, unspecified acute renal failure type, unspecified whether septic shock present [facility])    ED Discharge Orders    None       Eustaquio Maize, PA-C 06/25/19 1827    Lucrezia Starch, MD 06/26/19 [phone]

## 2019-06-25 NOTE — ED Notes (Signed)
Patient transported to X-ray 

## 2019-06-25 NOTE — ED Notes (Signed)
1st set of cultures collected before antibiotics started.

## 2019-06-25 NOTE — ED Notes (Signed)
US at bedside

## 2019-06-25 NOTE — Consult Note (Signed)
Vascular and Vein Specialist of [facility]  Patient name: Wendy Townsend MRN: [id number] DOB: 1950/11/05 Sex: female   REQUESTING PROVIDER:   [facility]   REASON FOR CONSULT:    Left leg pain  HISTORY OF PRESENT ILLNESS:   Wendy Townsend is a 68 y.o. female, who is well-known to our service for issues with lower extremity ischemia.  The patient initially presented in 2017 with bilateral rest pain and a left leg wound.  On 07/14/2016 she underwent an aortobifemoral bypass graft as well as a left femoral to below-knee popliteal artery bypass with 6 mm propatent graft.  She had a history of a left femoral-popliteal bypass graft at that time which had occluded.  Prior to her surgery, angiography revealed a left iliac occlusion as well as a right external iliac occlusion.  She then presented in January 2020 with ischemic pain to her left leg.  Her femoral-popliteal bypass graft was occluded as well as the left limb of her aortic graft.  The aortic graft could not be opened and so she underwent a right to left femoral-femoral crossover graft with the left-sided anastomosis and a end to end fashion to the profundofemoral artery.  After thrombectomy of the femoral-popliteal bypass graft, the proximal anastomosis was redone to the left limb of the femoral-femoral crossover graft.  She was seen in February and was doing well with biphasic peroneal and dorsalis pedis Doppler signals.  She was supposed to follow-up 3 months later, but did not.  She presented to the emergency department today with at least a 4-day history of severe pain in her left leg which was preventing ambulation.  She has had some relief with pain medicine in the emergency department but continues to hang her leg over the side of the bed.  She says she can still feel her foot.  She was started on IV heparin.  Patient takes a statin for hypercholesterolemia.  She is on aspirin  daily.  She is medically managed for hypertension.  She does have a history of stroke with some loss of sensation on the left side.  PAST MEDICAL HISTORY    Past Medical History:  Diagnosis Date   Clotting disorder ([place])    Hyperlipidemia    Hypertension    Peripheral vascular disease ([place])    vein surgery, left leg   Stroke ([place]) 2002   ongoing mild loss of feeling in left side, had to learn to write right-handed   Substance abuse ([place])    Tobacco abuse      FAMILY HISTORY   Family History  Problem Relation Age of Onset   Hypertension Mother 37   Cancer Brother    Cancer Sister    Colon cancer Neg Hx    Colon polyps Neg Hx    Esophageal cancer Neg Hx    Rectal cancer Neg Hx    Stomach cancer Neg Hx     SOCIAL HISTORY:   Social History   Socioeconomic History   Marital status: Widowed    Spouse name: Not on file   Number of children: Not on file   Years of education: Not on file   Highest education level: Not on file  Occupational History   Occupation: retired  Scientist, product/process development strain: Not hard at all   Food insecurity    Worry: Never true    Inability: Never true   Transportation needs    Medical: No    Non-medical: No  Tobacco Use   Smoking status: Former Smoker    Years: 40.00    Types: Cigarettes    Quit date: 05/2017    Years since quitting: 2.0   Smokeless tobacco: Never Used   Tobacco comment: 10/04/2018 "stopped for awhile then started back"  Substance and Sexual Activity   Alcohol use: Not Currently    Comment: 2-3 times a year   Drug use: Yes    Types: Marijuana    Comment: when she can't sleep sometimes at night   Sexual activity: Not Currently  Lifestyle   Physical activity    Days per week: 0 days    Minutes per session: 0 min   Stress: Only a little  Relationships   Social connections    Talks on phone: Three times a week    Gets together: Three times a week    Attends religious  service: 1 to 4 times per year    Active member of club or organization: No    Attends meetings of clubs or organizations: Never    Relationship status: Widowed   Intimate partner violence    Fear of current or ex partner: No    Emotionally abused: No    Physically abused: No    Forced sexual activity: No  Other Topics Concern   Not on file  Social History Narrative   DIET: None      DO YOU DRINK/EAT THINGS WITH CAFFEINE: no      MARITAL STATUS: Widow      WHAT YEAR WERE YOU MARRIED: 1990      DO YOU LIVE IN A HOUSE, APARTMENT, ASSISTED LIVING, CONDO TRAILER ETC.:  House      IS IT ONE OR MORE STORIES: One      HOW MANY PERSONS LIVE IN YOUR HOME: 3      DO YOU HAVE PETS IN YOUR HOME: no      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      DO YOU EXERCISE: sometimes      WHAT TYPE AND HOW OFTEN: once a week    ALLERGIES:    Allergies  Allergen Reactions   Penicillins Swelling    Has patient had a PCN reaction causing immediate rash, facial/tongue/throat swelling, SOB or lightheadedness with hypotension: YES Has patient had a PCN reaction causing severe rash involving mucus membranes or skin necrosis: NO Has patient had a PCN reaction that required hospitalization NO Has patient had a PCN reaction occurring within the last 10 years: NO If all of the above answers are "NO", then may proceed with Cephalosporin use.    CURRENT MEDICATIONS:    Current Facility-Administered Medications  Medication Dose Route Frequency Provider Last Rate Last Dose   0.9 %  sodium chloride infusion   Intravenous Continuous Samuella Cota, MD   Stopped at 06/25/19 2131   acetaminophen (TYLENOL) tablet 650 mg  650 mg Oral Q6H PRN Samuella Cota, MD       Or   acetaminophen (TYLENOL) suppository 650 mg  650 mg Rectal Q6H PRN Samuella Cota, MD       aspirin EC tablet 81 mg  81 mg Oral Daily Samuella Cota, MD       [START ON 06/26/2019] aztreonam (AZACTAM) 1 g in sodium  chloride 0.9 % 100 mL IVPB  1 g Intravenous Q8H Lenis Noon, RPH       heparin ADULT infusion 100 units/mL (25000 units/234mL sodium chloride 0.45%)  1,050 Units/hr Intravenous Continuous  Samuella Cota, MD 10.5 mL/hr at 06/25/19 1700 1,050 Units/hr at 06/25/19 1700   morphine 2 MG/ML injection 2-4 mg  2-4 mg Intravenous Q4H PRN Samuella Cota, MD   2 mg at 06/25/19 1949   ondansetron (ZOFRAN) tablet 4 mg  4 mg Oral Q6H PRN Samuella Cota, MD       Or   ondansetron [facility]) injection 4 mg  4 mg Intravenous Q6H PRN Samuella Cota, MD       potassium chloride 10 mEq in 100 mL IVPB  10 mEq Intravenous Q1 Hr x 4 Samuella Cota, MD 100 mL/hr at 06/25/19 2201 10 mEq at 06/25/19 2201   sodium chloride flush (NS) 0.9 % injection 3 mL  3 mL Intravenous Q12H Samuella Cota, MD       Derrill Memo ON 06/27/2019] vancomycin (VANCOCIN) IVPB 750 mg/150 ml premix  750 mg Intravenous Q48H Lenis Noon, RPH        REVIEW OF SYSTEMS:   [X]  denotes positive finding, [ ]  denotes negative finding Cardiac  Comments:  Chest pain or chest pressure:    Shortness of breath upon exertion:    Short of breath when lying flat:    Irregular heart rhythm:        Vascular    Pain in calf, thigh, or hip brought on by ambulation: x   Pain in feet at night that wakes you up from your sleep:  x   Blood clot in your veins:    Leg swelling:         Pulmonary    Oxygen at home:    Productive cough:     Wheezing:         Neurologic    Sudden weakness in arms or legs:     Sudden numbness in arms or legs:     Sudden onset of difficulty speaking or slurred speech:    Temporary loss of vision in one eye:     Problems with dizziness:         Gastrointestinal    Blood in stool:      Vomited blood:         Genitourinary    Burning when urinating:     Blood in urine:        Psychiatric    Major depression:         Hematologic    Bleeding problems:    Problems with blood clotting too  easily:        Skin    Rashes or ulcers:        Constitutional    Fever or chills:     PHYSICAL EXAM:   Vitals:   06/25/19 1930 06/25/19 2000 06/25/19 2130 06/25/19 2200  BP: 123/82 118/62 105/66 119/63  Pulse: 78 80 84 80  Resp: 18 18 17 18   Temp:      TempSrc:      SpO2: 99% 100% 100% 100%  Weight:      Height:        GENERAL: The patient is a well-nourished female, in no acute distress. The vital signs are documented above. CARDIAC: There is a regular rate and rhythm.  VASCULAR: There is a palpable pulse in both groins as well as in her femoral-femoral crossover graft.  No Doppler signals were obtained at the foot.  PULMONARY: Nonlabored respirations ABDOMEN: Soft and non-tender with normal pitched bowel sounds.  MUSCULOSKELETAL: There are no major deformities or cyanosis. NEUROLOGIC:  She has difficulty moving her left toes but does have sensation in the foot. SKIN: No open wounds on the left leg however the skin is very scaly. PSYCHIATRIC: The patient has a normal affect.  STUDIES:   I have reviewed her duplex which shows occlusion of her femoral-popliteal bypass graft  ASSESSMENT and PLAN   Threatened left leg.  I had an extensive conversation with the patient stating that she is at very high risk for limb loss.  This is her third occlusion of her femoral-popliteal graft.  Her symptoms have been persistent for 4 days.  I have recommended that she get transferred to [facility] for further vascular intervention.  I feel that she is running out of options for revascularization.  Given her multiple redo surgeries, I would like to see if we can get her graft open percutaneously with the use of thrombolytic therapy.  This procedure will be scheduled for the morning.  She will be n.p.o. after midnight.   [name], MD, FACS Vascular and Vein Specialists of [facility] [phone] Pager [phone]

## 2019-06-25 NOTE — Consult Note (Signed)
History and Physical  Wendy Townsend [id number] DOB: 1951/08/07 DOA: 06/25/2019  PCP: Sandrea Hughs, NP   Chief Complaint: Unbearable left foot pain  HPI:  68 year old woman PMH peripheral vascular disease, CKD stage III, stroke PRESENTED to [place] [facility] the emergency department with 2 days of severe foot pain.  Ultrasound showed occlusion of left femoral popliteal bypass.  EDP discussed with vascular surgery with recommendation for heparin and transferred to [facility].  Not clear that surgery will be required.  Patient reports she has had scaling skin since her surgery.  She has had intermittent waxing and waning pain in her foot.  However approximately 2 days ago she developed intense pain in the left foot, severe, unable to ambulate on it.  Improved with pain medication in the emergency department.  No specific aggravating or alleviating factors.  No specific associated symptoms noted.  Chart review: . 10/03/2018 presented to the hospital with profound ischemia of her left foot with occlusion of her left limb of her aortofemoral graft and her left femoropopliteal bypass.  She was taken emergently to the operating room where she underwent attempted thrombectomy of the left limb.  This was not possible.  She underwent right to left femorofemoral bypass and thrombectomy of her left femoral to popliteal bypass both through the groin and through the below-knee popliteal level.  She did well in the hospital and was discharged home  ED Course: Notes: Presented from home, hard time walking secondary to left leg pain.  EDP noted patient presented with questionable wound infection left leg/foot, dealing with dry skin.  1 2 weeks things have been getting worse.  Per EMS fever 101.2.  EDP was unable to palpate DP, PT pulse.  Unable to obtain dopplerable pulses DP, PT, popliteal.  Femoral pulses noted.  Review of Systems:  No fever, changes to vision, sore throat, rash, muscle aches other than as above.  No  chest pain or shortness of breath.    Some vomiting over the last couple of days, none today.  Anorexia over the last few days.  Now she is hungry.  No dysuria or bleeding.  PMH . Peripheral vascular disease . Hypertension, hyperlipidemia . Stroke with feeling loss in the left side.  Had to learn to write right-handed. . Prediabetes not on any medications . CKD stage III with anemia of CKD . CAD . Remainder reviewed in Epic  [place] . Aorto bifemoral bypass 2017 . Thrombectomy left leg with left femoral-popliteal bypass and right to left femoral-femoral bypass 2020 . Left femoropopliteal 2017 . Remainder reviewed in Epic  Family history includes: . Mother with hypertension . Remainder reviewed in [place] History . Does not smoke or drink  Allergies . Penicillin caused swelling in college . Remainder reviewed in Epic  Meds include: . Aspirin 81 mg daily . Lipitor 80 mg daily . Carvedilol 3.125 twice daily, clonidine 0.2 mg twice daily, hydrochlorothiazide 25 mg daily, Zestril 40 mg daily . Remainder reviewed in [place]:  . 99.9, 17, 74, 124/67, 100% on room air  Constitutional:   . Appears calm and comfortable Eyes:  . pupils and irises appear normal . Normal lids  ENMT:  . grossly normal hearing  . Lips appear normal Neck:  . neck appears normal, no masses . no thyromegaly Respiratory:  . CTA bilaterally, no w/r/r.  . Respiratory effort normal.  Cardiovascular:  . RRR, no m/r/g . No LE extremity edema   Abdomen:  .  Soft, nontender, nondistended.  No hernia seen. Musculoskeletal:  . Digits/nails BUE: no clubbing, cyanosis, petechiae, infection . Left lower extremity appears weak, inability to lift leg off bed. Skin:  . Dry scaling skin left lower extremity, particularly pronounced over the left foot. Psychiatric:  . Mental status o Mood, affect appropriate . judgment and insight appear intact    I have personally reviewed  following labs and imaging studies  Labs:   Potassium 2.5, sodium down to 129, chloride down to 91 probably secondary to poor oral intake  BUN 54, creatinine up to 3.77 which is higher than September at 2.28 in February of 1.36  Total bilirubin minimally elevated at 1.5 probably from dehydration  CRP 23.9  Lactic acid within normal limits  WBC 17.5, hemoglobin stable at 9.6 compared to September  Lactic acid within normal limits  ESR 89  Left tibia, foot, ankle films no acute abnormalities  Significant Hospital Events   . 10/6 admitted for left foot pain, occluded left femoral-popliteal bypass graft   Consults:  Marland Kitchen Vascular surgery   Procedures:  .   Significant Diagnostic Tests:  . SARS-CoV-2 negative . 10/6 preliminary vascular report: Occluded left femoral-popliteal bypass graft to proximal thigh.   Micro Data:  .    Antimicrobials:  . Vancomycin 10/6 >  ASSESSMENT/PLAN  Severe left foot pain, suspected ischemic foot pain, occluded left femoral-popliteal bypass for approximately 48 or more hours, peripheral vascular disease --Patient now very comfortable after morphine.   --Case discussed with Dr. Trula Slade, recommends transfer to [facility], IV heparin.  Not clear to him whether patient will require surgery yet or not. --Given reported fever, significant leukocytosis, borderline low BP on presentation, elevated CRP, will treat with empiric antibiotics.  Lactic acid within normal limits.  Now normotensive after IV fluids consistent with history of anorexia and poor fluid intake.  Appears well and stable.  Acute kidney injury superimposed on CKD stage III, dehydration --Secondary to poor oral intake, anorexia, vomiting secondary to severe pain complicated by diuretic, lisinopril --IV fluids, strict intake/output.  Hold lisinopril and diuretic. --BMP in a.m.  Hypokalemia --Replete.  Check magnesium in a.m.  Essential hypertension --BP was soft earlier, now normotensive   Anemia of CKD, stable --Follow CBC  PMH stroke --Continue aspirin  DVT prophylaxis: heparin infusion Code Status: Full Family Communication: none Consults called: VVS    Time spent: 60 minutes  Murray Hodgkins, MD  [facility] Direct contact: see [email or web address]  7PM-7AM contact night coverage as below   1. Check the care team in [facility] and look for a) attending/consulting TR[facility] provider listed and b) the [facility] team listed 2. Log into [email or web address] and use [place]'s universal password to access. If you do not have the password, please contact the hospital operator. 3. Locate the [facility] provider you are looking for under [facility] and page to a number that you can be directly reached. 4. If you still have difficulty reaching the provider, please page the [facility] (Director on Call) for the Hospitalists listed on amion for assistance.  Severity of Illness: The appropriate patient status for this patient is INPATIENT. Inpatient status is judged to be reasonable and necessary in order to provide the required intensity of service to ensure the patient's safety. The patient's presenting symptoms, physical exam findings, and initial radiographic and laboratory data in the context of their chronic comorbidities is felt to place them at high risk for further clinical deterioration. Furthermore, it is not anticipated that the patient will  be medically stable for discharge from the hospital within 2 midnights of admission. The following factors support the patient status of inpatient.   " The patient's presenting symptoms include severe left foot pain. " The worrisome physical exam findings include lack of pulses left leg except for femoral, very dry skin. " The initial radiographic and laboratory data are worrisome because of occluded left vascular graft. " The chronic co-morbidities include peripheral vascular disease, hyperlipidemia, hypertension.   * I certify that at the point of  admission it is my clinical judgment that the patient will require inpatient hospital care spanning beyond 2 midnights from the point of admission due to high intensity of service, high risk for further deterioration and high frequency of surveillance required.*   06/25/2019, 5:04 PM   Active Problems:   * No active hospital problems. *

## 2019-06-25 NOTE — ED Notes (Signed)
PA at bedside.

## 2019-06-25 NOTE — ED Notes (Signed)
Report called to [facility] 

## 2019-06-25 NOTE — Progress Notes (Signed)
ANTICOAGULATION CONSULT NOTE - Initial Consult  Pharmacy Consult for heparin  Indication: occluded left femoral-popliteal bypass graft at the proximal thigh  Allergies  Allergen Reactions  . Penicillins Swelling    Has patient had a PCN reaction causing immediate rash, facial/tongue/throat swelling, SOB or lightheadedness with hypotension: YES Has patient had a PCN reaction causing severe rash involving mucus membranes or skin necrosis: NO Has patient had a PCN reaction that required hospitalization NO Has patient had a PCN reaction occurring within the last 10 years: NO If all of the above answers are "NO", then may proceed with Cephalosporin use.    Patient Measurements:   Heparin Dosing Weight: used 67.6 kg from 03/05/19  Vital Signs: Temp: 99.9 F (37.7 C) (10/06 1427) Temp Source: Rectal (10/06 1427) BP: 85/69 (10/06 1603) Pulse Rate: 74 (10/06 1603)  Labs: Recent Labs    06/25/19 1434  HGB 9.6*  HCT 29.2*  PLT 377  CREATININE 3.77*    CrCl cannot be calculated (Unknown ideal weight.).   Medical History: Past Medical History:  Diagnosis Date  . Clotting disorder ([place])   . Hyperlipidemia   . Hypertension   . Peripheral vascular disease ([place])    vein surgery, left leg  . Stroke [facility]) 2002   ongoing mild loss of feeling in left side, had to learn to write right-handed  . Substance abuse ([place])   . Tobacco abuse     Assessment: 68 yo F to start heparin per pharmacy for occluded left femoral-popliteal bypass graft at the proximal thigh.  No anticoagulants PTA.  SCr elevated at 3.77. Hg 9.6. PLTC WNL.    Goal of Therapy:  Heparin level 0.3-0.7 units/ml Monitor platelets by anticoagulation protocol: Yes   Plan:  Give 2000 units bolus x 1 Start heparin infusion at 1050 units/hr Check anti-Xa level in 8 hours and daily while on heparin Continue to monitor H&H and platelets  Eudelia Bunch, Pharm.D [phone] 06/25/2019 4:17 PM

## 2019-06-25 NOTE — Progress Notes (Signed)
Pharmacy Antibiotic Note  Wendy Townsend is a 68 y.o. female admitted on 06/25/2019 with wound infection.  Pharmacy has been consulted for vancomycin + aztreonam dosing.  Patient has PMH significant for PVD, CKD stage III, CVA presenting to the ED with severe foot pain. US showed occlusion of left femoral popliteal bypass, empiric antibiotics being initiated.   Today, 06/25/19  WBC 17.5  SCr 3.7, CrCl 14 mL/min  CRP 23.9, Sed Rate 89  Afebrile  Pt received aztreonam 2 g IV once and vancomycin 1000 mg IV once in ED.   Plan:  Aztreonam 1 g IV q8h  Vancomycin 750 mg IV q48h  Goal vancomycin AUC 400-550  Monitor SCr daily while on antibiotics  Follow culture data for ability to de-escalate ABX    Temp (24hrs), Avg:99.1 F (37.3 C), Min:98.2 F (36.8 C), Max:99.9 F (37.7 C)  Recent Labs  Lab 06/25/19 1434 06/25/19 1642  WBC 17.5*  --   CREATININE 3.77*  --   LATICACIDVEN 1.7 1.6    CrCl cannot be calculated (Unknown ideal weight.).    Allergies  Allergen Reactions  . Penicillins Swelling    Has patient had a PCN reaction causing immediate rash, facial/tongue/throat swelling, SOB or lightheadedness with hypotension: YES Has patient had a PCN reaction causing severe rash involving mucus membranes or skin necrosis: NO Has patient had a PCN reaction that required hospitalization NO Has patient had a PCN reaction occurring within the last 10 years: NO If all of the above answers are "NO", then may proceed with Cephalosporin use.    Antimicrobials this admission: vancomycin 10/6 >>  aztreonam 10/6 >>   Dose adjustments this admission:  Microbiology results: 10/6 SARS-2: Negative  Thank you for allowing pharmacy to be a part of this patient's care.  Lenis Noon, PharmD 06/25/2019 6:09 PM

## 2019-06-25 NOTE — ED Triage Notes (Signed)
EMS states pt is from home. Pt has had a hard time ambulating for the past week due to leg pain. Pt has left leg pain from wound on top of her foot, all the way up to her knee.  Temp 101.5 BP 94/56 HR 80 SpO2 96 on RA CBG 147

## 2019-06-25 NOTE — ED Notes (Signed)
ED TO INPATIENT HANDOFF REPORT  Name/Age/Gender Wendy Townsend 68 y.o. female  Code Status    Code Status Orders  (From admission, onward)         Start     Ordered   06/25/19 2005  Full code  Continuous     06/25/19 2004        Code Status History    Date Active Date Inactive Code Status Order ID Comments User Context   10/03/2018 2137 10/11/2018 [place] Full Code [id number]  Orbie Hurst Inpatient   07/20/2016 1502 07/27/2016 1514 Full Code [id number]  Cathlyn Parsons, PA-C Inpatient   07/20/2016 1502 07/20/2016 1502 Full Code [id number]  Elizabeth Sauer Inpatient   07/05/2016 2323 07/20/2016 1457 Full Code [id number]  Karmen Bongo, MD Inpatient   Advance Care Planning Activity      Home/SNF/Other Home  Chief Complaint wound infection left leg  Level of Care/Admitting Diagnosis ED Disposition    ED Disposition Condition [facility] Area: [place] [[id number]]  Level of Care: Med-Surg [16]  Covid Evaluation: Confirmed COVID Negative  Diagnosis: Left foot pain [id number]  Admitting Physician: [place], [place]  Attending Physician: Samuella Cota [[id number]]  Estimated length of stay: past midnight tomorrow  Certification:: I certify this patient will need inpatient services for at least 2 midnights  PT Class (Do Not Modify): Inpatient [101]  PT Acc Code (Do Not Modify): Private [1]       Medical History Past Medical History:  Diagnosis Date  . Clotting disorder ([place])   . Hyperlipidemia   . Hypertension   . Peripheral vascular disease ([place])    vein surgery, left leg  . Stroke [facility]) 2002   ongoing mild loss of feeling in left side, had to learn to write right-handed  . Substance abuse ([place])   . Tobacco abuse     Allergies Allergies  Allergen Reactions  . Penicillins Swelling    Has patient had a PCN reaction causing immediate rash, facial/tongue/throat swelling, SOB or lightheadedness with  hypotension: YES Has patient had a PCN reaction causing severe rash involving mucus membranes or skin necrosis: NO Has patient had a PCN reaction that required hospitalization NO Has patient had a PCN reaction occurring within the last 10 years: NO If all of the above answers are "NO", then may proceed with Cephalosporin use.    IV Location/Drains/Wounds Patient Lines/Drains/Airways Status   Active Line/Drains/Airways    Name:   Placement date:   Placement time:   Site:   Days:   Peripheral IV 06/25/19 Right Antecubital   06/25/19    1444    Antecubital   less than 1   Incision (Closed) 10/03/18 Groin Left   10/03/18    1931     265   Incision (Closed) 10/03/18 Groin Right   10/03/18    1931     265   Incision (Closed) 10/03/18 Leg Left   10/03/18    1931     265   Wound / Incision (Open or Dehisced) 07/15/16 Other (Comment) Toe (Comment  which one) Left dry, open, blacken, necrotic, on 2nd and 5th toes of left foot   07/15/16    0730    Toe (Comment  which one)   1075   Wound / Incision (Open or Dehisced) 10/09/18 Other (Comment) Heel Left Stage 2    10/09/18    0630    Heel   259  Labs/Imaging Results for orders placed or performed during the hospital encounter of 06/25/19 (from the past 48 hour(s))  Comprehensive metabolic panel     Status: Abnormal   Collection Time: 06/25/19  2:34 PM  Result Value Ref Range   Sodium 129 (L) 135 - 145 mmol/L   Potassium 2.5 (LL) 3.5 - 5.1 mmol/L    Comment: CRITICAL RESULT CALLED TO, READ BACK BY AND VERIFIED WITH: S.BINGHAM AT 1552 ON 06/25/19 BY N.THOMPSON    Chloride 91 (L) 98 - 111 mmol/L   CO2 24 22 - 32 mmol/L   Glucose, Bld 116 (H) 70 - 99 mg/dL   BUN 54 (H) 8 - 23 mg/dL   Creatinine, Ser 3.77 (H) 0.44 - 1.00 mg/dL   Calcium 8.5 (L) 8.9 - 10.3 mg/dL   Total Protein 7.9 6.5 - 8.1 g/dL   Albumin 3.5 3.5 - 5.0 g/dL   AST 27 15 - 41 U/L   ALT 12 0 - 44 U/L   Alkaline Phosphatase 97 38 - 126 U/L   Total Bilirubin 1.5 (H) 0.3 -  1.2 mg/dL   GFR calc non Af Amer 12 (L) >60 mL/min   GFR calc Af Amer 14 (L) >60 mL/min   Anion gap 14 5 - 15    Comment: Performed at [facility], [place] [address], [place], [place] [address]  CBC with Differential     Status: Abnormal   Collection Time: 06/25/19  2:34 PM  Result Value Ref Range   WBC 17.5 (H) 4.0 - 10.5 K/uL   RBC 3.14 (L) 3.87 - 5.11 MIL/uL   Hemoglobin 9.6 (L) 12.0 - 15.0 g/dL   HCT 29.2 (L) 36.0 - 46.0 %   MCV 93.0 80.0 - 100.0 fL   MCH 30.6 26.0 - 34.0 pg   MCHC 32.9 30.0 - 36.0 g/dL   RDW 13.7 11.5 - 15.5 %   Platelets 377 150 - 400 K/uL   nRBC 0.0 0.0 - 0.2 %   Neutrophils Relative % 81 %   Neutro Abs 14.1 (H) 1.7 - 7.7 K/uL   Lymphocytes Relative 8 %   Lymphs Abs 1.5 0.7 - 4.0 K/uL   Monocytes Relative 10 %   Monocytes Absolute 1.7 (H) 0.1 - 1.0 K/uL   Eosinophils Relative 0 %   Eosinophils Absolute 0.0 0.0 - 0.5 K/uL   Basophils Relative 0 %   Basophils Absolute 0.0 0.0 - 0.1 K/uL   Immature Granulocytes 1 %   Abs Immature Granulocytes 0.14 (H) 0.00 - 0.07 K/uL    Comment: Performed at [facility], [place] [address] [place], [place] [address]  Lactic acid, plasma     Status: None   Collection Time: 06/25/19  2:34 PM  Result Value Ref Range   Lactic Acid, Venous 1.7 0.5 - 1.9 mmol/L    Comment: Performed at [facility], [place] [address], [place], [place] [address]  Sedimentation rate     Status: Abnormal   Collection Time: 06/25/19  2:57 PM  Result Value Ref Range   Sed Rate 89 (H) 0 - 22 mm/hr    Comment: Performed at [facility], [place] [address] [place], [place] [address]  C-reactive protein     Status: Abnormal   Collection Time: 06/25/19  2:57 PM  Result Value Ref Range   CRP 23.9 (H) <1.0 mg/dL    Comment: Performed at [facility], [place] [address] [place], [place] [address]  SARS Coronavirus 2 [facility] order, Performed in [facility] hospital lab)  Nasopharyngeal Nasopharyngeal Swab     Status: None   Collection Time: 06/25/19  2:57 PM   Specimen: Nasopharyngeal Swab  Result Value Ref Range   SARS Coronavirus 2 NEGATIVE NEGATIVE    Comment: (NOTE) If result is NEGATIVE SARS-CoV-2 target nucleic acids are NOT DETECTED. The SARS-CoV-2 RNA is generally detectable in upper and lower  respiratory specimens during the acute phase of infection. The lowest  concentration of SARS-CoV-2 viral copies this assay can detect is 250  copies / mL. A negative result does not preclude SARS-CoV-2 infection  and should not be used as the sole basis for treatment or other  patient management decisions.  A negative result may occur with  improper specimen collection / handling, submission of specimen other  than nasopharyngeal swab, presence of viral mutation(s) within the  areas targeted by this assay, and inadequate number of viral copies  (<250 copies / mL). A negative result must be combined with clinical  observations, patient history, and epidemiological information. If result is POSITIVE SARS-CoV-2 target nucleic acids are DETECTED. The SARS-CoV-2 RNA is generally detectable in upper and lower  respiratory specimens dur ing the acute phase of infection.  Positive  results are indicative of active infection with SARS-CoV-2.  Clinical  correlation with patient history and other diagnostic information is  necessary to determine patient infection status.  Positive results do  not rule out bacterial infection or co-infection with other viruses. If result is PRESUMPTIVE POSTIVE SARS-CoV-2 nucleic acids MAY BE PRESENT.   A presumptive positive result was obtained on the submitted specimen  and confirmed on repeat testing.  While 2019 novel coronavirus  (SARS-CoV-2) nucleic acids may be present in the submitted sample  additional confirmatory testing may be necessary for epidemiological  and / or clinical management purposes  to differentiate  between  SARS-CoV-2 and other Sarbecovirus currently known to infect humans.  If clinically indicated additional testing with an alternate test  methodology [phone]) is advised. The SARS-CoV-2 RNA is generally  detectable in upper and lower respiratory sp ecimens during the acute  phase of infection. The expected result is Negative. Fact Sheet for Patients:  StrictlyIdeas.no Fact Sheet for Healthcare Providers: BankingDealers.co.za This test is not yet approved or cleared by the [place] FDA and has been authorized for detection and/or diagnosis of SARS-CoV-2 by FDA under an Emergency Use Authorization (EUA).  This EUA will remain in effect (meaning this test can be used) for the duration of the COVID-19 declaration under Section 564(b)(1) of the Act, 21 U.S.C. section 360bbb-3(b)(1), unless the authorization is terminated or revoked sooner. Performed at [facility], [place] [address], [place], [place] [address]   Lactic acid, plasma     Status: None   Collection Time: 06/25/19  4:42 PM  Result Value Ref Range   Lactic Acid, Venous 1.6 0.5 - 1.9 mmol/L    Comment: Performed at [facility], [place] [address] [place], [place] [address]   Dg Tibia/fibula Left  Result Date: 06/25/2019 CLINICAL DATA:  Pt with pain to lower extremity. Pt skin is weeping and flaking off in some areas from just below knee down to mid left foot. States she has a history of skin grafts to lower leg EXAM: LEFT TIBIA AND FIBULA - 2 VIEW COMPARISON:  None. FINDINGS: No fracture or bone lesion. Skeletal structures are demineralized. Knee and ankle joints are normally aligned. There are soft  tissue defects along the anterolateral aspect of the ankle and lower leg. No radiopaque foreign body. There scattered vascular calcifications. IMPRESSION: 1. No fracture, bone lesion or evidence of osteomyelitis. Knee and ankle joints normally  aligned. 2. Superficial soft tissue defects along the anterolateral ankle and lower leg. Electronically Signed   By: Lajean Manes M.D.   On: 06/25/2019 15:51   Dg Ankle Complete Left  Result Date: 06/25/2019 CLINICAL DATA:  Pt with pain to lower extremity. Pt skin is weeping and flaking off in some areas from just below knee down to mid left foot. States she has a history of skin grafts to lower leg EXAM: LEFT ANKLE COMPLETE - 3+ VIEW COMPARISON:  None. FINDINGS: No fracture or bone lesion. Skeletal structures are diffusely demineralized. No bone resorption to suggest osteomyelitis. Ankle joint normally spaced and aligned.  No arthropathic changes. They were are superficial soft tissue defects along the anterolateral aspect of the ankle. No soft tissue air. No radiopaque foreign body. IMPRESSION: 1. No fracture, bone lesion or evidence of osteomyelitis. 2. Superficial to soft tissue defects along the anterolateral ankle. Electronically Signed   By: Lajean Manes M.D.   On: 06/25/2019 15:50   Dg Foot Complete Left  Result Date: 06/25/2019 CLINICAL DATA:  Pt with pain to lower extremity. Pt skin is weeping and flaking off in some areas from just below knee down to mid left foot. States she has a history of skin grafts to lower leg EXAM: LEFT FOOT - COMPLETE 3+ VIEW COMPARISON:  None. FINDINGS: No fracture or bone lesion. Skeletal structures are diffusely demineralized. Absent distal phalanges of the second and fifth toes with small tapering middle phalanges. Findings consistent with previous distal toe amputations. There are no areas of bone resorption to suggest osteomyelitis. Joints are normally spaced and aligned. Soft tissues are unremarkable. IMPRESSION: 1. No fracture or acute finding.  No evidence of osteomyelitis. Electronically Signed   By: Lajean Manes M.D.   On: 06/25/2019 15:48   Vas [place] Lower Extremity Bypass Graft Dupl  Result Date: 06/25/2019 LOWER EXTREMITY ARTERIAL DUPLEX STUDY  Indications: Gangrene to left foot and tibial area, and peripheral artery              disease.  Current ABI: Unable to obtain secondary to wounds Limitations: Patient unable to position properly secondary to pain Comparison Study: 10/04/2018 ABI- right =0.65, left= 0.65 Performing Technologist: Maudry Mayhew MHA, RDMS, RVT, RDCS  Examination Guidelines: A complete evaluation includes B-mode imaging, spectral Doppler, color Doppler, and power Doppler as needed of all accessible portions of each vessel. Bilateral testing is considered an integral part of a complete examination. Limited examinations for reoccurring indications may be performed as noted.  Right Graft #1: Right to left femoral-femoral bypass graft +------------------+--------+--------+----------+-------------------+                   PSV cm/sStenosisWaveform  Comments            +------------------+--------+--------+----------+-------------------+ Inflow                                      Unable to visualize +------------------+--------+--------+----------+-------------------+ Prox Anastomosis  45              monophasic                    +------------------+--------+--------+----------+-------------------+ Proximal Graft    72  monophasic                    +------------------+--------+--------+----------+-------------------+ Mid Graft         36              monophasic                    +------------------+--------+--------+----------+-------------------+ Distal Graft      52              monophasic                    +------------------+--------+--------+----------+-------------------+ Distal Anastomosis188             monophasic                    +------------------+--------+--------+----------+-------------------+ Outflow                                     Unable to visualize +------------------+--------+--------+----------+-------------------+  Left Graft #1: Left  femoral-popliteal bypass graft +--------------------+--------+--------+----------+----------------------------+                     PSV cm/sStenosisWaveform  Comments                     +--------------------+--------+--------+----------+----------------------------+ Inflow                                        Unable to visualize          +--------------------+--------+--------+----------+----------------------------+ Proximal Anastomosis16              monophasic                             +--------------------+--------+--------+----------+----------------------------+ Proximal Graft      47              monophasicOccluded just distal to                                                    proximal segment.            +--------------------+--------+--------+----------+----------------------------+ Mid Graft                   occluded                                       +--------------------+--------+--------+----------+----------------------------+ Distal Graft                occluded                                       +--------------------+--------+--------+----------+----------------------------+ Distal Anastomosis          occluded                                       +--------------------+--------+--------+----------+----------------------------+ Outflow  Unable to visualize          +--------------------+--------+--------+----------+----------------------------+  Summary: Right: Patent right to left femoral-femoral bypass graft. Left: Color Doppler and pulsed wave Doppler suggest occluded left femoral-popliteal bypass graft at the proximal thigh. Multiple heterogenous areas visualized in the left groin, suggestive of prominent inguinal lymph nodes.  See table(s) above for measurements and observations.    Preliminary     Pending Labs Unresulted Labs (From admission, onward)    Start     Ordered   06/26/19  0500  CBC  Daily,   R    Comments: While on heparin drip    06/25/19 1620   06/26/19 0500  HIV Antibody (routine testing w rflx)  (HIV Antibody (Routine testing w reflex) panel)  Tomorrow morning,   R     06/25/19 2004   06/26/19 0500  HIV4GL Save Tube  (HIV Antibody (Routine testing w reflex) panel)  Tomorrow morning,   R     06/25/19 2004   06/26/19 [id number]  Basic metabolic panel  Tomorrow morning,   R     06/25/19 2004   06/26/19 0500  Magnesium  Tomorrow morning,   R     06/25/19 2004   06/26/19 0500  Phosphorus  Tomorrow morning,   R     06/25/19 2004   06/26/19 0500  Creatinine, serum  Daily,   R     06/25/19 1850   06/26/19 0100  Heparin level (unfractionated)  Once-Timed,   STAT     06/25/19 1703   06/25/19 1425  Urinalysis, Routine w reflex microscopic  ONCE - STAT,   STAT     06/25/19 1425          Vitals/Pain Today's Vitals   06/25/19 1930 06/25/19 2000 06/25/19 2130 06/25/19 2135  BP: 123/82 118/62 105/66   Pulse: 78 80 84   Resp: 18 18 17    Temp:      TempSrc:      SpO2: 99% 100% 100%   Weight:      Height:      PainSc:    2     Isolation Precautions No active isolations  Medications Medications  heparin ADULT infusion 100 units/mL (25000 units/248mL sodium chloride 0.45%) (1,050 Units/hr Intravenous New Bag/Given 06/25/19 1700)  aspirin EC tablet 81 mg (has no administration in time range)  sodium chloride flush (NS) 0.9 % injection 3 mL (has no administration in time range)  0.9 %  sodium chloride infusion ( Intravenous Paused 06/25/19 2131)  acetaminophen (TYLENOL) tablet 650 mg (has no administration in time range)    Or  acetaminophen (TYLENOL) suppository 650 mg (has no administration in time range)  ondansetron (ZOFRAN) tablet 4 mg (has no administration in time range)    Or  ondansetron (ZOFRAN) injection 4 mg (has no administration in time range)  potassium chloride 10 mEq in 100 mL IVPB (has no administration in time range)  magnesium sulfate  IVPB 2 g 50 mL (0 g Intravenous Paused 06/25/19 2131)  morphine 2 MG/ML injection 2-4 mg (2 mg Intravenous Given 06/25/19 1949)  aztreonam (AZACTAM) 1 g in sodium chloride 0.9 % 100 mL IVPB (has no administration in time range)  vancomycin (VANCOCIN) IVPB 750 mg/150 ml premix (has no administration in time range)  sodium chloride 0.9 % bolus 1,000 mL (0 mLs Intravenous Stopped 06/25/19 1638)  vancomycin (VANCOCIN) IVPB 1000 mg/200 mL premix (0 mg Intravenous Stopped 06/25/19 1604)  morphine 4 MG/ML injection 4 mg (4  mg Intravenous Given 06/25/19 1532)  potassium chloride 10 mEq in 100 mL IVPB (0 mEq Intravenous Stopped 06/25/19 1744)  heparin bolus via infusion 2,000 Units (2,000 Units Intravenous Bolus from Bag 06/25/19 1701)  aztreonam (AZACTAM) 2 g in sodium chloride 0.9 % 100 mL IVPB (0 g Intravenous Stopped 06/25/19 2131)    Mobility walks with device

## 2019-06-25 NOTE — Progress Notes (Signed)
Lower extremity bypass graft duplex completed. Refer to "CV Proc" under chart review to view preliminary results.  Critical results discussed with Dr. Roslynn Amble.  06/25/2019 4:03 PM Maudry Mayhew, MHA, RVT, RDCS, RDMS

## 2019-06-25 NOTE — ED Notes (Signed)
Pure wick placed on patient. Patient repositioned in bed.

## 2019-06-26 ENCOUNTER — Encounter (HOSPITAL_COMMUNITY)
Admission: EM | Disposition: A | Discharge status: Home Health | Payer: Self-pay | Source: Home / Self Care | Attending: Internal Medicine

## 2019-06-26 DIAGNOSIS — T82868A Thrombosis of vascular prosthetic devices, implants and grafts, initial encounter: Secondary | ICD-10-CM

## 2019-06-26 HISTORY — PX: LOWER EXTREMITY ANGIOGRAPHY: CATH118251

## 2019-06-26 LAB — URINALYSIS, ROUTINE W REFLEX MICROSCOPIC
Bilirubin Urine: NEGATIVE
Glucose, UA: NEGATIVE mg/dL
Ketones, ur: NEGATIVE mg/dL
Leukocytes,Ua: NEGATIVE
Nitrite: NEGATIVE
Protein, ur: NEGATIVE mg/dL
Specific Gravity, Urine: 1.013 (ref 1.005–1.030)
pH: 5 (ref 5.0–8.0)

## 2019-06-26 LAB — BASIC METABOLIC PANEL
Anion gap: 13 (ref 5–15)
BUN: 41 mg/dL — ABNORMAL HIGH (ref 8–23)
CO2: 20 mmol/L — ABNORMAL LOW (ref 22–32)
Calcium: 8.1 mg/dL — ABNORMAL LOW (ref 8.9–10.3)
Chloride: 101 mmol/L (ref 98–111)
Creatinine, Ser: 2.03 mg/dL — ABNORMAL HIGH (ref 0.44–1.00)
GFR calc Af Amer: 29 mL/min — ABNORMAL LOW (ref >60)
GFR calc non Af Amer: 25 mL/min — ABNORMAL LOW (ref >60)
Glucose, Bld: 123 mg/dL — ABNORMAL HIGH (ref 70–99)
Potassium: 3.3 mmol/L — ABNORMAL LOW (ref 3.5–5.1)
Sodium: 134 mmol/L — ABNORMAL LOW (ref 135–145)

## 2019-06-26 LAB — CBC
HCT: 23.3 % — ABNORMAL LOW (ref 36.0–46.0)
Hemoglobin: 8 g/dL — ABNORMAL LOW (ref 12.0–15.0)
MCH: 30.8 pg (ref 26.0–34.0)
MCHC: 34.3 g/dL (ref 30.0–36.0)
MCV: 89.6 fL (ref 80.0–100.0)
Platelets: 325 10*3/uL (ref 150–400)
RBC: 2.6 MIL/uL — ABNORMAL LOW (ref 3.87–5.11)
RDW: 13.5 % (ref 11.5–15.5)
WBC: 15.1 10*3/uL — ABNORMAL HIGH (ref 4.0–10.5)
nRBC: 0 % (ref 0.0–0.2)

## 2019-06-26 LAB — HEPARIN LEVEL (UNFRACTIONATED)
Heparin Unfractionated: 0.36 IU/mL (ref 0.30–0.70)
Heparin Unfractionated: 0.63 IU/mL (ref 0.30–0.70)
Heparin Unfractionated: 0.91 IU/mL — ABNORMAL HIGH (ref 0.30–0.70)

## 2019-06-26 LAB — GLUCOSE, CAPILLARY: Glucose-Capillary: 116 mg/dL — ABNORMAL HIGH (ref 70–99)

## 2019-06-26 LAB — PROTIME-INR
INR: 1.2 (ref 0.8–1.2)
Prothrombin Time: 15.4 seconds — ABNORMAL HIGH (ref 11.4–15.2)

## 2019-06-26 SURGERY — LOWER EXTREMITY ANGIOGRAPHY
Anesthesia: LOCAL

## 2019-06-26 MED ORDER — MORPHINE SULFATE (PF) 4 MG/ML IV SOLN: 5.0000 mg | INTRAVENOUS | Status: DC | PRN

## 2019-06-26 MED ORDER — LABETALOL HCL 5 MG/ML IV SOLN
10.0000 mg | INTRAVENOUS | Status: DC | PRN
  Filled 2019-06-26: qty 4

## 2019-06-26 MED ORDER — FENTANYL CITRATE (PF) 100 MCG/2ML IJ SOLN
INTRAMUSCULAR | Status: AC
  Filled 2019-06-26: qty 2

## 2019-06-26 MED ORDER — SODIUM CHLORIDE 0.9% FLUSH: 3.0000 mL | INTRAVENOUS | Status: DC | PRN

## 2019-06-26 MED ORDER — MORPHINE SULFATE (PF) 2 MG/ML IV SOLN
2.0000 mg | INTRAVENOUS | Status: DC | PRN
  Administered 2019-06-26 (×3): 4 mg via INTRAVENOUS
  Filled 2019-06-26 (×4): qty 2

## 2019-06-26 MED ORDER — HEPARIN (PORCINE) 25000 UT/250ML-% IV SOLN: 800.0000 [IU]/h | INTRAVENOUS | Status: DC

## 2019-06-26 MED ORDER — MORPHINE SULFATE (PF) 10 MG/ML IV SOLN: 5.0000 mg | INTRAVENOUS | Status: DC | PRN

## 2019-06-26 MED ORDER — MIDAZOLAM HCL 2 MG/2ML IJ SOLN: 1.0000 mg | INTRAMUSCULAR | Status: DC | PRN

## 2019-06-26 MED ORDER — LIDOCAINE HCL (PF) 1 % IJ SOLN
INTRAMUSCULAR | Status: AC
  Filled 2019-06-26: qty 30

## 2019-06-26 MED ORDER — SODIUM CHLORIDE 0.9 % IV SOLN
INTRAVENOUS | Status: DC
  Administered 2019-06-27 – 2019-06-28 (×2): via INTRAVENOUS

## 2019-06-26 MED ORDER — SODIUM CHLORIDE 0.9 % IV SOLN: 250.0000 mL | INTRAVENOUS | Status: DC | PRN

## 2019-06-26 MED ORDER — MIDAZOLAM HCL 2 MG/2ML IJ SOLN
INTRAMUSCULAR | Status: DC | PRN
  Administered 2019-06-26 (×2): 1 mg via INTRAVENOUS

## 2019-06-26 MED ORDER — CHLORHEXIDINE GLUCONATE CLOTH 2 % EX PADS
6.0000 | MEDICATED_PAD | Freq: Every day | CUTANEOUS | Status: DC
  Administered 2019-06-26 – 2019-07-07 (×7): 6 via TOPICAL

## 2019-06-26 MED ORDER — SODIUM CHLORIDE 0.9 % IV SOLN
1.0000 mg/h | INTRAVENOUS | Status: DC
  Administered 2019-06-26 – 2019-06-27 (×2): 1 mg/h
  Filled 2019-06-26 (×7): qty 10

## 2019-06-26 MED ORDER — SODIUM CHLORIDE 0.9% FLUSH
3.0000 mL | Freq: Two times a day (BID) | INTRAVENOUS | Status: DC
  Administered 2019-06-28 – 2019-07-08 (×14): 3 mL via INTRAVENOUS

## 2019-06-26 MED ORDER — ONDANSETRON HCL 4 MG/2ML IJ SOLN: 4.0000 mg | Freq: Four times a day (QID) | INTRAMUSCULAR | Status: DC | PRN

## 2019-06-26 MED ORDER — SODIUM CHLORIDE 0.9 % IV SOLN: 1.0000 mg/h | INTRAVENOUS | Status: DC

## 2019-06-26 MED ORDER — POTASSIUM CHLORIDE 10 MEQ/100ML IV SOLN
INTRAVENOUS | Status: AC
  Administered 2019-06-26: 10 meq
  Filled 2019-06-26: qty 100

## 2019-06-26 MED ORDER — SODIUM CHLORIDE 0.9 % IV SOLN
INTRAVENOUS | Status: DC
  Filled 2019-06-26: qty 10

## 2019-06-26 MED ORDER — FENTANYL CITRATE (PF) 100 MCG/2ML IJ SOLN
INTRAMUSCULAR | Status: DC | PRN
  Administered 2019-06-26 (×4): 25 ug via INTRAVENOUS

## 2019-06-26 MED ORDER — HEPARIN (PORCINE) IN NACL 1000-0.9 UT/500ML-% IV SOLN
INTRAVENOUS | Status: DC | PRN
  Administered 2019-06-26 (×2): 500 mL

## 2019-06-26 MED ORDER — LIDOCAINE HCL (PF) 1 % IJ SOLN
INTRAMUSCULAR | Status: DC | PRN
  Administered 2019-06-26: 15 mL via INTRADERMAL

## 2019-06-26 MED ORDER — IODIXANOL 320 MG/ML IV SOLN
INTRAVENOUS | Status: DC | PRN
  Administered 2019-06-26: 15:00:00 75 mL via INTRA_ARTERIAL

## 2019-06-26 MED ORDER — MORPHINE SULFATE (PF) 2 MG/ML IV SOLN: 2.0000 mg | INTRAVENOUS | Status: DC | PRN

## 2019-06-26 MED ORDER — MORPHINE SULFATE (PF) 2 MG/ML IV SOLN
2.0000 mg | INTRAVENOUS | Status: DC | PRN
  Administered 2019-06-26 – 2019-06-27 (×9): 4 mg via INTRAVENOUS
  Administered 2019-06-28 – 2019-07-02 (×10): 2 mg via INTRAVENOUS
  Filled 2019-06-26 (×2): qty 1
  Filled 2019-06-26: qty 2
  Filled 2019-06-26: qty 1
  Filled 2019-06-26 (×2): qty 2
  Filled 2019-06-26: qty 1
  Filled 2019-06-26 (×3): qty 2
  Filled 2019-06-26 (×6): qty 1
  Filled 2019-06-26 (×2): qty 2
  Filled 2019-06-26 (×2): qty 1

## 2019-06-26 MED ORDER — MIDAZOLAM HCL 2 MG/2ML IJ SOLN
INTRAMUSCULAR | Status: AC
  Filled 2019-06-26: qty 2

## 2019-06-26 MED ORDER — HEPARIN (PORCINE) IN NACL 1000-0.9 UT/500ML-% IV SOLN
INTRAVENOUS | Status: AC
  Filled 2019-06-26: qty 1000

## 2019-06-26 SURGICAL SUPPLY — 14 items
CATH ANGIO 5F BER2 65CM (CATHETERS) ×1 IMPLANT
CATH BEACON 5 .035 65 KMP TIP (CATHETERS) ×1 IMPLANT
CATH INFUS 135CMX50CM (CATHETERS) ×1 IMPLANT
COVER DOME SNAP 22 D (MISCELLANEOUS) ×1 IMPLANT
DEVICE TORQUE .025-.038 (MISCELLANEOUS) ×1 IMPLANT
GUIDEWIRE ANGLED .035X150CM (WIRE) ×1 IMPLANT
KIT MICROPUNCTURE NIT STIFF (SHEATH) ×1 IMPLANT
KIT PV (KITS) ×2 IMPLANT
SHEATH PINNACLE 5F 10CM (SHEATH) ×1 IMPLANT
SHEATH PINNACLE 6F 10CM (SHEATH) ×1 IMPLANT
SHEATH PROBE COVER 6X72 (BAG) ×1 IMPLANT
TRANSDUCER W/STOPCOCK (MISCELLANEOUS) ×2 IMPLANT
WIRE BENTSON .035X145CM (WIRE) ×1 IMPLANT
WIRE ROSEN-J .035X260CM (WIRE) ×1 IMPLANT

## 2019-06-26 NOTE — Progress Notes (Signed)
ANTICOAGULATION CONSULT NOTE   Pharmacy Consult for heparin  Indication: occluded left femoral-popliteal bypass graft at the proximal thigh  Allergies  Allergen Reactions  . Penicillins Swelling    Has patient had a PCN reaction causing immediate rash, facial/tongue/throat swelling, SOB or lightheadedness with hypotension: YES Has patient had a PCN reaction causing severe rash involving mucus membranes or skin necrosis: NO Has patient had a PCN reaction that required hospitalization NO Has patient had a PCN reaction occurring within the last 10 years: NO If all of the above answers are "NO", then may proceed with Cephalosporin use.    Patient Measurements: Height: 5\' 4"  (162.6 cm) Weight: 147 lb (66.7 kg) IBW/kg (Calculated) : 54.7 Heparin Dosing Weight: used 67.6 kg from 03/05/19  Vital Signs: Temp: 98.9 F (37.2 C) (10/07 0931) Temp Source: Oral (10/07 0931) BP: 172/85 (10/07 1555) Pulse Rate: 97 (10/07 1555)  Labs: Recent Labs    06/25/19 1434 06/26/19 0110 06/26/19 0514 06/26/19 0938  HGB 9.6*  --  8.0*  --   HCT 29.2*  --  23.3*  --   PLT 377  --  325  --   LABPROT  --   --  15.4*  --   INR  --   --  1.2  --   HEPARINUNFRC  --  0.36 0.63 0.91*  CREATININE 3.77*  --  2.03*  --     Estimated Creatinine Clearance: 25.3 mL/min (A) (by C-G formula based on SCr of 2.03 mg/dL (H)).   Medical History: Past Medical History:  Diagnosis Date  . Clotting disorder ([place])   . Hyperlipidemia   . Hypertension   . Peripheral vascular disease ([place])    vein surgery, left leg  . Stroke [facility]) 2002   ongoing mild loss of feeling in left side, had to learn to write right-handed  . Substance abuse ([place])   . Tobacco abuse     Assessment: 68 yo F to on heparin  for occluded left femoral-popliteal bypass graft at the proximal thigh.  No anticoagulants PTA.   She is now s/p procedure with arterial thrombolysis (tPA @ 1mg /hr).  Spoke with Dr. Carlis Abbott and plans are to keep heparin  at 800 units/hr while on tPA   Goal of Therapy:  No titration unless heparin level > 0.5 Monitor platelets by anticoagulation protocol: Yes   Plan:  -Continue heparin at 800 units/hr -CBC, heparin level and fibrinogen in [place], PharmD Clinical Pharmacist **Pharmacist phone directory can now be found on [email or web address] ([email or web address]).  Listed under [place].

## 2019-06-26 NOTE — Progress Notes (Addendum)
Pt incontinent with fem sheath still present- RN notified MD Early- verbal orders given to place foley catheter. MD notified of Pt's high blood pressure- see new prn orders.  RN also notified MD that Pt will not keep L Left straight as ordered- leg is fully contracted with sheath in place- orders given to place knee immobilizer.  -MD notified that IV/ Peripheral access no longer available for lab draws. Verbal orders given that Phlebotomy may stick Pt to draw labs  and/or RN insert another peripheral IV line if needed while Pt receiving tpa.   - @0530 - 2 RNs attempted to place urinary catheter twice. Unable to place foley catheter due to Pt's anatomy/ pt agitation- will relay to day RN- would be best to place foley catheter during procedure today for Pt comfort.

## 2019-06-26 NOTE — Progress Notes (Signed)
ANTICOAGULATION CONSULT NOTE   Pharmacy Consult for heparin  Indication: occluded left femoral-popliteal bypass graft at the proximal thigh  Allergies  Allergen Reactions  . Penicillins Swelling    Has patient had a PCN reaction causing immediate rash, facial/tongue/throat swelling, SOB or lightheadedness with hypotension: YES Has patient had a PCN reaction causing severe rash involving mucus membranes or skin necrosis: NO Has patient had a PCN reaction that required hospitalization NO Has patient had a PCN reaction occurring within the last 10 years: NO If all of the above answers are "NO", then may proceed with Cephalosporin use.    Patient Measurements: Height: 5\' 4"  (162.6 cm) Weight: 147 lb (66.7 kg) IBW/kg (Calculated) : 54.7 Heparin Dosing Weight: used 67.6 kg from 03/05/19  Vital Signs: Temp: 99.1 F (37.3 C) (10/06 2257) Temp Source: Oral (10/06 2257) BP: 120/69 (10/06 2257) Pulse Rate: 87 (10/06 2257)  Labs: Recent Labs    06/25/19 1434 06/26/19 0110  HGB 9.6*  --   HCT 29.2*  --   PLT 377  --   HEPARINUNFRC  --  0.36  CREATININE 3.77*  --     Estimated Creatinine Clearance: 13.6 mL/min (A) (by C-G formula based on SCr of 3.77 mg/dL (H)).   Medical History: Past Medical History:  Diagnosis Date  . Clotting disorder ([place])   . Hyperlipidemia   . Hypertension   . Peripheral vascular disease ([place])    vein surgery, left leg  . Stroke [facility]) 2002   ongoing mild loss of feeling in left side, had to learn to write right-handed  . Substance abuse ([place])   . Tobacco abuse     Assessment: 68 yo F to start heparin per pharmacy for occluded left femoral-popliteal bypass graft at the proximal thigh.  No anticoagulants PTA.  SCr elevated at 3.77. Hg 9.6. PLTC WNL.   Initial heparin level came back therapeutic at 0.36, on 1050 units/hr. No s/sx of bleeding or infusion issues per nursing. Notes indicate plan for vascular procedure in the AM.  Goal of Therapy:   Heparin level 0.3-0.7 units/ml Monitor platelets by anticoagulation protocol: Yes   Plan:  Continue heparin infusion at 1050 units/hr Check anti-Xa level in 8 hours and daily while on heparin Continue to monitor H&H and platelets  Antonietta Jewel, PharmD, BCCCP Clinical Pharmacist   Please check AMION for all [place] phone numbers After 10:00 PM, call Donna [phone] 06/26/2019 1:33 AM

## 2019-06-26 NOTE — Progress Notes (Signed)
[facility]  Subjective  -severe pain in left leg   Objective 117/78 98 98.9 F (37.2 C) (Oral) 18 100%  Intake/Output Summary (Last 24 hours) at 06/26/2019 1358 Last data filed at 06/26/2019 1200 Gross per 24 hour  Intake 3168.66 ml  Output 0 ml  Net 3168.66 ml    There is a palpable pulse in both groins as well as in her femoral-femoral crossover graft.  No Doppler signals were obtained in left foot.  Laboratory Lab Results: Recent Labs    06/25/19 1434 06/26/19 0514  WBC 17.5* 15.1*  HGB 9.6* 8.0*  HCT 29.2* 23.3*  PLT 377 325   BMET Recent Labs    06/25/19 1434 06/26/19 0514  NA 129* 134*  K 2.5* 3.3*  CL 91* 101  CO2 24 20*  GLUCOSE 116* 123*  BUN 54* 41*  CREATININE 3.77* 2.03*  CALCIUM 8.5* 8.1*    COAG Lab Results  Component Value Date   INR 1.2 06/26/2019   INR 1.43 07/14/2016   INR 1.15 07/07/2016   No results found for: PTT  Assessment/Planning:  Patient seen last night by Dr. Trula Slade in the ED.  Appears that her right to left femorofemoral bypass is patent but her left femoral distal bypass is occluded.  Plan is to attempt thrombolysis.  I discussed with her after looking at her leg that I think she is at exceedingly high risk for limb loss even with maximal intervention.  Marty Heck 06/26/2019 1:58 PM --

## 2019-06-26 NOTE — Op Note (Signed)
Patient name: Wendy Townsend MRN: [id number] DOB: [birth year] Sex: female  06/26/2019 Pre-operative Diagnosis: Thrombosed left femoral to below-knee popliteal prosthetic bypass with acute on chronic limb ischemia Post-operative diagnosis:  Same Surgeon:  Marty Heck, MD Procedure Performed: 1.  Ultrasound-guided access of the right to left femoral-femoral bypass 2.  Second-order cannulation of the left femoral to below-knee prosthetic bypass from femoral-femoral approach 3.  Placement of thrombolytics UniFuse catheter down the left femoral to below knee popliteal bypass 4.  Initiation of arterial thrombolysis of the left lower extremity 5.  71 minutes of monitored moderate conscious sedation time  Indications: Patient is a 68 year old female well-known to the vascular surgery service.  She previously had a aortobifemoral bypass and left femoral to below knee prosthetic bypass.  Ultimately the left limb of this aortobifemoral bypass occluded earlier this year and she underwent right to left femoral-femoral bypass with Dr. early and then thrombectomy of her left femoral to below-knee prosthetic bypass and this was re-implanted on the femoral-femoral bypass.  She was seen last night in the ED by Dr. Trula Slade and had acute on chronic limb ischemia of her left lower extremity with thrombosis of the femoral to popliteal bypass in her left leg.  Findings:   The right to left femoral-femoral bypass was patent although the profunda was very diseased distal to the bypass.  I had a lot of difficulty getting into the left femoral to below knee popliteal bypass given there was no identifiable stump to cannulate.  The bypass was occluded with no flow.  After multiple attempts with a KMP as well as a BER-2 catheter, I finally cannulated the bypass and got into the below-knee prosthetic bypass which was thrombosed.  There was no outflow below the knee and no distal target.  Ultimately 135 cm length  UniFuse catheter with 50 cm treatment length was placed into the left femoral to below-knee prosthetic bypass from the femoral femoral sheath to initiate thrombolysis overnight in ICU with alteplase.  She has no signals in left lower extremity.  I think she is exceedingly high risk for limb loss.   Procedure:  The patient was identified in the holding area and taken to room 8.  The patient was then placed supine on the table and prepped and draped in the usual sterile fashion.  A time out was called.  Ultrasound was used to evaluate the right to left femoral femoral bypass.  It was patent .  A digital ultrasound image was acquired.  A micropuncture needle was used to access the right to left femoral femoral bypass under ultrasound guidance.  An 018 wire was advanced without resistance and a micropuncture sheath was placed.  The 018 wire was removed and a benson wire was placed.  The micropuncture sheath was exchanged for a 6 french sheath.  A KMP catheter was advanced into the femoral-femoral bypass.  I obtained multiple injections with hand-held contrast through the KMP catheter and the femorofemoral bypass was patent as previously documented.  The profunda is severely diseased with high-grade stenosis distal to where the bypass plugs in with end to end anastomosis.  I could not find a stump to cannulate even after looking at multiple views.  Finally I was able to use a multitude of catheters and wires with a KMP and then a BER2 catheter and was able to get into the thrombosed bypass.  Ultimately got a soft angled Glidewire down into the below-knee prosthetic bypass and advanced the BER 2  catheter down there.  I then put a Rosen wire down and exchanged for a long 135 cm UniFuse catheter with 50 cm treatment length.  The inner cannula was then placed.  I did inject some contrast through the catheter to ensure we were indeed in the thrombosed bypass.  There was no outflow below the knee.  No identifiable target  otherwise.  Initiate thrombolysis in the ICU tonight.  The 6 French sheath was secured as well as the UniFuse catheter.  We will infuse heparin at 800 units an hour through the sheath.  TPA will be infused at 1 mg an hour through the catheter and will follow labs accordingly.  Plan: Return to Cath Lab 10/8 for thrombolytics catheter check.    Marty Heck, MD [facility] [place] Office: [phone] Pager: [place]

## 2019-06-26 NOTE — Progress Notes (Signed)
PROGRESS NOTE    Wendy Townsend  [id number] DOB: 07-Jan-1951 DOA: 06/25/2019 PCP: Sandrea Hughs, NP   Brief Narrative: 68 year old woman PMH peripheral vascular disease, CKD stage III, stroke PRESENTED to [place] long the emergency department with 2 days of severe foot pain.  Ultrasound showed occlusion of left femoral popliteal bypass.  EDP discussed with vascular surgery with recommendation for heparin and transferred to [facility].  Not clear that surgery will be required.  Patient reports she has had scaling skin since her surgery.  She has had intermittent waxing and waning pain in her foot.  However approximately 2 days ago she developed intense pain in the left foot, severe, unable to ambulate on it.  Improved with pain medication in the emergency department.  No specific aggravating or alleviating factors.  No specific associated symptoms noted.  Chart review:  10/03/2018 presented to the hospital with profound ischemia of her left foot with occlusion of her left limb of her aortofemoral graft and her left femoropopliteal bypass. She was taken emergently to the operating room where she underwent attempted thrombectomy of the left limb. This was not possible. She underwent right to left femorofemoral bypass and thrombectomy of her left femoral to popliteal bypass both through the groin and through the below-knee popliteal level. She did well in the hospital and was discharged home  ED Course: Notes: Presented from home, hard time walking secondary to left leg pain.  EDP noted patient presented with questionable wound infection left leg/foot, dealing with dry skin.  1 2 weeks things have been getting worse.  Per EMS fever 101.2.  EDP was unable to palpate DP, PT pulse.  Unable to obtain dopplerable pulses DP, PT, popliteal.  Femoral pulses noted.  06/26/2019 patient resting in bed complaining of severe left lower extremity pain  Assessment & Plan:   Principal Problem:   Left foot  pain Active Problems:   Ischemic pain of foot, left   AKI (acute kidney injury) ([place])   Dehydration   CKD (chronic kidney disease), stage III   Anemia in chronic kidney disease (CKD)   #1 left lower extremity ischemic pain secondary to occluded left femoropopliteal bypass -patient to have arteriogram today.  Followed by vascular surgery.  Patient to have  attempted thrombolysis today.  I will DC IV antibiotics since there is no signs of cellulitis or gangrene or evidence of bacteremia.  We will follow closely.  Continue IV heparin.  #2 AKI on CKD stage III improved with IV fluids creatinine today is 2.03 down from 3.77 upon admission.  Continue to hold hydrochlorothiazide ACE inhibitor.  #3 hypertension blood pressure improved with IV fluids.  Will hold Coreg, Catapres, hydrochlorothiazide,zestril.   #4 history of stroke with mild left-sided weakness on aspirin at home.  Currently on IV heparin.  #5 history of tobacco use and substance abuse counseled.  #6 anemia of chronic disease hemoglobin dropped from 9.6-8 possibly hemodilution monitor closely on IV heparin  #7 hyponatremia improved with normal saline patient was dry and dehydrated at the time of admission in addition to being on hydrochlorothiazide and ACE.  #8 hypokalemia improved potassium 3.3 with a creatinine of 2.03.    Estimated body mass index is 25.23 kg/m as calculated from the following:   Height as of this encounter: 5\' 4"  (1.626 m).   Weight as of this encounter: 66.7 kg.  DVT prophylaxis: Heparin IV  code Status: Full code  family Communication: No family available Disposition Plan: Pending clinical improvement and possible  surgery or thrombolysis Consultants:   Vascular surgery  Procedures: None Antimicrobials: None Subjective: Patient resting in bed complains of severe left lower extremity pain asking for something for pain denies any vomiting diarrhea nausea  Objective: Vitals:   06/25/19 2257  06/26/19 0505 06/26/19 0931 06/26/19 1348  BP: 120/69 (!) 100/55 117/78   Pulse: 87 90 98   Resp: 18 18 18    Temp: 99.1 F (37.3 C) 98.3 F (36.8 C) 98.9 F (37.2 C)   TempSrc: Oral Oral Oral   SpO2: 100% 100% 97% 100%  Weight:      Height:        Intake/Output Summary (Last 24 hours) at 06/26/2019 1505 Last data filed at 06/26/2019 1200 Gross per 24 hour  Intake 3168.66 ml  Output 0 ml  Net 3168.66 ml   Filed Weights   06/25/19 1826  Weight: 66.7 kg    Examination:  General exam: Appears calm and comfortable  Respiratory system: Clear to auscultation. Respiratory effort normal. Cardiovascular system: S1 & S2 heard, RRR. No JVD, murmurs, rubs, gallops or clicks. No pedal edema. Gastrointestinal system: Abdomen is nondistended, soft and nontender. No organomegaly or masses felt. Normal bowel sounds heard. Central nervous system: Alert and oriented. No focal neurological deficits. Extremities: Scaling noted in the left lower extremity no distal pulses left lower extremity Skin: No rashes, lesions or ulcers Psychiatry: Judgement and insight appear normal. Mood & affect appropriate.     Data Reviewed: I have personally reviewed following labs and imaging studies  CBC: Recent Labs  Lab 06/25/19 1434 06/26/19 0514  WBC 17.5* 15.1*  NEUTROABS 14.1*  --   HGB 9.6* 8.0*  HCT 29.2* 23.3*  MCV 93.0 89.6  PLT 377 [id number]   Basic Metabolic Panel: Recent Labs  Lab 06/25/19 1434 06/26/19 0514  NA 129* 134*  K 2.5* 3.3*  CL 91* 101  CO2 24 20*  GLUCOSE 116* 123*  BUN 54* 41*  CREATININE 3.77* 2.03*  CALCIUM 8.5* 8.1*   GFR: Estimated Creatinine Clearance: 25.3 mL/min (A) (by C-G formula based on SCr of 2.03 mg/dL (H)). Liver Function Tests: Recent Labs  Lab 06/25/19 1434  AST 27  ALT 12  ALKPHOS 97  BILITOT 1.5*  PROT 7.9  ALBUMIN 3.5   No results for input(s): LIPASE, AMYLASE in the last 168 hours. No results for input(s): AMMONIA in the last 168  hours. Coagulation Profile: Recent Labs  Lab 06/26/19 0514  INR 1.2   Cardiac Enzymes: No results for input(s): CKTOTAL, CKMB, CKMBINDEX, TROPONINI in the last 168 hours. BNP (last 3 results) No results for input(s): PROBNP in the last 8760 hours. HbA1C: No results for input(s): HGBA1C in the last 72 hours. CBG: No results for input(s): GLUCAP in the last 168 hours. Lipid Profile: No results for input(s): CHOL, HDL, LDLCALC, TRIG, CHOLHDL, LDLDIRECT in the last 72 hours. Thyroid Function Tests: No results for input(s): TSH, T4TOTAL, FREET4, T3FREE, THYROIDAB in the last 72 hours. Anemia Panel: No results for input(s): VITAMINB12, FOLATE, FERRITIN, TIBC, IRON, RETICCTPCT in the last 72 hours. Sepsis Labs: Recent Labs  Lab 06/25/19 1434 06/25/19 1642  LATICACIDVEN 1.7 1.6    Recent Results (from the past 240 hour(s))  SARS Coronavirus 2 [facility] order, Performed in [facility] hospital lab) Nasopharyngeal Nasopharyngeal Swab     Status: None   Collection Time: 06/25/19  2:57 PM   Specimen: Nasopharyngeal Swab  Result Value Ref Range Status   SARS Coronavirus 2 NEGATIVE NEGATIVE Final  Comment: (NOTE) If result is NEGATIVE SARS-CoV-2 target nucleic acids are NOT DETECTED. The SARS-CoV-2 RNA is generally detectable in upper and lower  respiratory specimens during the acute phase of infection. The lowest  concentration of SARS-CoV-2 viral copies this assay can detect is 250  copies / mL. A negative result does not preclude SARS-CoV-2 infection  and should not be used as the sole basis for treatment or other  patient management decisions.  A negative result may occur with  improper specimen collection / handling, submission of specimen other  than nasopharyngeal swab, presence of viral mutation(s) within the  areas targeted by this assay, and inadequate number of viral copies  (<250 copies / mL). A negative result must be combined with clinical  observations, patient  history, and epidemiological information. If result is POSITIVE SARS-CoV-2 target nucleic acids are DETECTED. The SARS-CoV-2 RNA is generally detectable in upper and lower  respiratory specimens dur ing the acute phase of infection.  Positive  results are indicative of active infection with SARS-CoV-2.  Clinical  correlation with patient history and other diagnostic information is  necessary to determine patient infection status.  Positive results do  not rule out bacterial infection or co-infection with other viruses. If result is PRESUMPTIVE POSTIVE SARS-CoV-2 nucleic acids MAY BE PRESENT.   A presumptive positive result was obtained on the submitted specimen  and confirmed on repeat testing.  While 2019 novel coronavirus  (SARS-CoV-2) nucleic acids may be present in the submitted sample  additional confirmatory testing may be necessary for epidemiological  and / or clinical management purposes  to differentiate between  SARS-CoV-2 and other Sarbecovirus currently known to infect humans.  If clinically indicated additional testing with an alternate test  methodology [id number]) is advised. The SARS-CoV-2 RNA is generally  detectable in upper and lower respiratory sp ecimens during the acute  phase of infection. The expected result is Negative. Fact Sheet for Patients:  StrictlyIdeas.no Fact Sheet for Healthcare Providers: BankingDealers.co.za This test is not yet approved or cleared by the [place] FDA and has been authorized for detection and/or diagnosis of SARS-CoV-2 by FDA under an Emergency Use Authorization (EUA).  This EUA will remain in effect (meaning this test can be used) for the duration of the COVID-19 declaration under Section 564(b)(1) of the Act, 21 U.S.C. section 360bbb-3(b)(1), unless the authorization is terminated or revoked sooner. Performed at [facility], [place] [address], [place],  [place] [address]          Radiology Studies: Dg Tibia/fibula Left  Result Date: 06/25/2019 CLINICAL DATA:  Pt with pain to lower extremity. Pt skin is weeping and flaking off in some areas from just below knee down to mid left foot. States she has a history of skin grafts to lower leg EXAM: LEFT TIBIA AND FIBULA - 2 VIEW COMPARISON:  None. FINDINGS: No fracture or bone lesion. Skeletal structures are demineralized. Knee and ankle joints are normally aligned. There are soft tissue defects along the anterolateral aspect of the ankle and lower leg. No radiopaque foreign body. There scattered vascular calcifications. IMPRESSION: 1. No fracture, bone lesion or evidence of osteomyelitis. Knee and ankle joints normally aligned. 2. Superficial soft tissue defects along the anterolateral ankle and lower leg. Electronically Signed   By: Lajean Manes M.D.   On: 06/25/2019 15:51   Dg Ankle Complete Left  Result Date: 06/25/2019 CLINICAL DATA:  Pt with pain to lower extremity. Pt skin is weeping and flaking off in some  areas from just below knee down to mid left foot. States she has a history of skin grafts to lower leg EXAM: LEFT ANKLE COMPLETE - 3+ VIEW COMPARISON:  None. FINDINGS: No fracture or bone lesion. Skeletal structures are diffusely demineralized. No bone resorption to suggest osteomyelitis. Ankle joint normally spaced and aligned.  No arthropathic changes. They were are superficial soft tissue defects along the anterolateral aspect of the ankle. No soft tissue air. No radiopaque foreign body. IMPRESSION: 1. No fracture, bone lesion or evidence of osteomyelitis. 2. Superficial to soft tissue defects along the anterolateral ankle. Electronically Signed   By: Lajean Manes M.D.   On: 06/25/2019 15:50   Dg Foot Complete Left  Result Date: 06/25/2019 CLINICAL DATA:  Pt with pain to lower extremity. Pt skin is weeping and flaking off in some areas from just below knee down to mid left foot. States she has a  history of skin grafts to lower leg EXAM: LEFT FOOT - COMPLETE 3+ VIEW COMPARISON:  None. FINDINGS: No fracture or bone lesion. Skeletal structures are diffusely demineralized. Absent distal phalanges of the second and fifth toes with small tapering middle phalanges. Findings consistent with previous distal toe amputations. There are no areas of bone resorption to suggest osteomyelitis. Joints are normally spaced and aligned. Soft tissues are unremarkable. IMPRESSION: 1. No fracture or acute finding.  No evidence of osteomyelitis. Electronically Signed   By: Lajean Manes M.D.   On: 06/25/2019 15:48   Vas [place] Lower Extremity Bypass Graft Dupl  Result Date: 06/26/2019 LOWER EXTREMITY ARTERIAL DUPLEX STUDY Indications: Gangrene to left foot and tibial area, and peripheral artery              disease.  Current ABI: Unable to obtain secondary to wounds Limitations: Patient unable to position properly secondary to pain Comparison Study: 10/04/2018 ABI- right =0.65, left= 0.65 Performing Technologist: Maudry Mayhew MHA, RDMS, RVT, RDCS  Examination Guidelines: A complete evaluation includes B-mode imaging, spectral Doppler, color Doppler, and power Doppler as needed of all accessible portions of each vessel. Bilateral testing is considered an integral part of a complete examination. Limited examinations for reoccurring indications may be performed as noted.  Right Graft #1: Right to left femoral-femoral bypass graft +------------------+--------+--------+----------+-------------------+                     PSV cm/s Stenosis Waveform   Comments             +------------------+--------+--------+----------+-------------------+  Inflow                                          Unable to visualize  +------------------+--------+--------+----------+-------------------+  Prox Anastomosis   45                monophasic                      +------------------+--------+--------+----------+-------------------+  Proximal Graft      72                monophasic                      +------------------+--------+--------+----------+-------------------+  Mid Graft          36                monophasic                      +------------------+--------+--------+----------+-------------------+  Distal Graft       52                monophasic                      +------------------+--------+--------+----------+-------------------+  Distal Anastomosis 188               monophasic                      +------------------+--------+--------+----------+-------------------+  Outflow                                         Unable to visualize  +------------------+--------+--------+----------+-------------------+  Left Graft #1: Left femoral-popliteal bypass graft +--------------------+--------+--------+----------+----------------------------+                       PSV cm/s Stenosis Waveform   Comments                      +--------------------+--------+--------+----------+----------------------------+  Inflow                                            Unable to visualize           +--------------------+--------+--------+----------+----------------------------+  Proximal Anastomosis 16                monophasic                               +--------------------+--------+--------+----------+----------------------------+  Proximal Graft       47                monophasic Occluded just distal to                                                          proximal segment.             +--------------------+--------+--------+----------+----------------------------+  Mid Graft                     occluded                                          +--------------------+--------+--------+----------+----------------------------+  Distal Graft                  occluded                                          +--------------------+--------+--------+----------+----------------------------+  Distal Anastomosis            occluded                                           +--------------------+--------+--------+----------+----------------------------+  Outflow  Unable to visualize           +--------------------+--------+--------+----------+----------------------------+  Summary: Right: Patent right to left femoral-femoral bypass graft. Left: Color Doppler and pulsed wave Doppler suggest occluded left femoral-popliteal bypass graft at the proximal thigh. Multiple heterogenous areas visualized in the left groin, suggestive of prominent inguinal lymph nodes.  See table(s) above for measurements and observations. Electronically signed by Harold Barban MD on 06/26/2019 at 6:59:33 AM.    Final         Scheduled Meds:  [MAR Hold] aspirin EC  81 mg Oral Daily   [MAR Hold] sodium chloride flush  3 mL Intravenous Q12H   Continuous Infusions:  sodium chloride 150 mL/hr at 06/25/19 2301   sodium chloride     [MAR Hold] aztreonam 1 g (06/26/19 0826)   heparin Stopped (06/26/19 1357)   [MAR Hold] vancomycin       LOS: 1 day     Georgette Shell, MD [facility]  If 7PM-7AM, please contact night-coverage [email or web address] Password [email or web address] 06/26/2019, 3:05 PM

## 2019-06-26 NOTE — Progress Notes (Signed)
ANTICOAGULATION CONSULT NOTE   Pharmacy Consult for heparin  Indication: occluded left femoral-popliteal bypass graft at the proximal thigh  Allergies  Allergen Reactions  . Penicillins Swelling    Has patient had a PCN reaction causing immediate rash, facial/tongue/throat swelling, SOB or lightheadedness with hypotension: YES Has patient had a PCN reaction causing severe rash involving mucus membranes or skin necrosis: NO Has patient had a PCN reaction that required hospitalization NO Has patient had a PCN reaction occurring within the last 10 years: NO If all of the above answers are "NO", then may proceed with Cephalosporin use.    Patient Measurements: Height: 5\' 4"  (162.6 cm) Weight: 147 lb (66.7 kg) IBW/kg (Calculated) : 54.7 Heparin Dosing Weight: used 67.6 kg from 03/05/19  Vital Signs: Temp: 98.9 F (37.2 C) (10/07 0931) Temp Source: Oral (10/07 0931) BP: 117/78 (10/07 0931) Pulse Rate: 98 (10/07 0931)  Labs: Recent Labs    06/25/19 1434 06/26/19 0110 06/26/19 0514 06/26/19 0938  HGB 9.6*  --  8.0*  --   HCT 29.2*  --  23.3*  --   PLT 377  --  325  --   LABPROT  --   --  15.4*  --   INR  --   --  1.2  --   HEPARINUNFRC  --  0.36 0.63 0.91*  CREATININE 3.77*  --  2.03*  --     Estimated Creatinine Clearance: 25.3 mL/min (A) (by C-G formula based on SCr of 2.03 mg/dL (H)).   Medical History: Past Medical History:  Diagnosis Date  . Clotting disorder ([place])   . Hyperlipidemia   . Hypertension   . Peripheral vascular disease ([place])    vein surgery, left leg  . Stroke [facility]) 2002   ongoing mild loss of feeling in left side, had to learn to write right-handed  . Substance abuse ([place])   . Tobacco abuse     Assessment: 68 yo F to start heparin per pharmacy for occluded left femoral-popliteal bypass graft at the proximal thigh.  No anticoagulants PTA.  SCr elevated at 3.77. Hg 9.6. PLTC WNL.   Confirmatory heparin level came back high at 0.91, on 1050  units/hr. No s/sx of bleeding or infusion issues per nursing. Notes indicate plan for vascular procedure in the AM.  Goal of Therapy:  Heparin level 0.3-0.7 units/ml Monitor platelets by anticoagulation protocol: Yes   Plan:  Decrease heparin infusion to 950 units/hr Check anti-Xa level in 8 hours and daily while on heparin Continue to monitor H&H and platelets  Americo Vallery A. Levada Dy, PharmD, BCPS, FNKF Clinical Pharmacist [place] Please utilize Amion for appropriate phone number to reach the unit pharmacist ([place])   06/26/2019 10:40 AM

## 2019-06-27 ENCOUNTER — Encounter (HOSPITAL_COMMUNITY): Payer: Self-pay | Admitting: Vascular Surgery

## 2019-06-27 ENCOUNTER — Encounter (HOSPITAL_COMMUNITY)
Admission: EM | Disposition: A | Discharge status: Home Health | Payer: Self-pay | Source: Home / Self Care | Attending: Internal Medicine

## 2019-06-27 ENCOUNTER — Ambulatory Visit (HOSPITAL_COMMUNITY): Admission: RE | Admit: 2019-06-27 | Payer: Medicare HMO | Source: Home / Self Care | Admitting: Vascular Surgery

## 2019-06-27 HISTORY — PX: LOWER EXTREMITY INTERVENTION: CATH118252

## 2019-06-27 HISTORY — PX: PERIPHERAL VASCULAR THROMBECTOMY: CATH118306

## 2019-06-27 LAB — CBC
HCT: 21.8 % — ABNORMAL LOW (ref 36.0–46.0)
HCT: 25.6 % — ABNORMAL LOW (ref 36.0–46.0)
HCT: 26.2 % — ABNORMAL LOW (ref 36.0–46.0)
Hemoglobin: 7.6 g/dL — ABNORMAL LOW (ref 12.0–15.0)
Hemoglobin: 8.7 g/dL — ABNORMAL LOW (ref 12.0–15.0)
Hemoglobin: 8.7 g/dL — ABNORMAL LOW (ref 12.0–15.0)
MCH: 30.6 pg (ref 26.0–34.0)
MCH: 31.4 pg (ref 26.0–34.0)
MCH: 31.5 pg (ref 26.0–34.0)
MCHC: 33.2 g/dL (ref 30.0–36.0)
MCHC: 34 g/dL (ref 30.0–36.0)
MCHC: 34.9 g/dL (ref 30.0–36.0)
MCV: 90.5 fL (ref 80.0–100.0)
MCV: 92.3 fL (ref 80.0–100.0)
MCV: 92.4 fL (ref 80.0–100.0)
Platelets: 270 10*3/uL (ref 150–400)
Platelets: 324 10*3/uL (ref 150–400)
Platelets: 375 10*3/uL (ref 150–400)
RBC: 2.41 MIL/uL — ABNORMAL LOW (ref 3.87–5.11)
RBC: 2.77 MIL/uL — ABNORMAL LOW (ref 3.87–5.11)
RBC: 2.84 MIL/uL — ABNORMAL LOW (ref 3.87–5.11)
RDW: 13.9 % (ref 11.5–15.5)
RDW: 13.9 % (ref 11.5–15.5)
RDW: 14 % (ref 11.5–15.5)
WBC: 16.1 10*3/uL — ABNORMAL HIGH (ref 4.0–10.5)
WBC: 18.1 10*3/uL — ABNORMAL HIGH (ref 4.0–10.5)
WBC: 18.3 10*3/uL — ABNORMAL HIGH (ref 4.0–10.5)
nRBC: 0 % (ref 0.0–0.2)
nRBC: 0 % (ref 0.0–0.2)
nRBC: 0 % (ref 0.0–0.2)

## 2019-06-27 LAB — CREATININE, SERUM
Creatinine, Ser: 1.29 mg/dL — ABNORMAL HIGH (ref 0.44–1.00)
GFR calc Af Amer: 50 mL/min — ABNORMAL LOW (ref >60)
GFR calc non Af Amer: 43 mL/min — ABNORMAL LOW (ref >60)

## 2019-06-27 LAB — HEPARIN LEVEL (UNFRACTIONATED)
Heparin Unfractionated: 0.37 IU/mL (ref 0.30–0.70)
Heparin Unfractionated: 0.43 IU/mL (ref 0.30–0.70)
Heparin Unfractionated: 0.49 IU/mL (ref 0.30–0.70)

## 2019-06-27 LAB — BASIC METABOLIC PANEL
Anion gap: 12 (ref 5–15)
BUN: 24 mg/dL — ABNORMAL HIGH (ref 8–23)
CO2: 19 mmol/L — ABNORMAL LOW (ref 22–32)
Calcium: 7.9 mg/dL — ABNORMAL LOW (ref 8.9–10.3)
Chloride: 109 mmol/L (ref 98–111)
Creatinine, Ser: 1.24 mg/dL — ABNORMAL HIGH (ref 0.44–1.00)
GFR calc Af Amer: 52 mL/min — ABNORMAL LOW (ref >60)
GFR calc non Af Amer: 45 mL/min — ABNORMAL LOW (ref >60)
Glucose, Bld: 112 mg/dL — ABNORMAL HIGH (ref 70–99)
Potassium: 3.3 mmol/L — ABNORMAL LOW (ref 3.5–5.1)
Sodium: 140 mmol/L (ref 135–145)

## 2019-06-27 LAB — FIBRINOGEN
Fibrinogen: 489 mg/dL — ABNORMAL HIGH (ref 210–475)
Fibrinogen: 597 mg/dL — ABNORMAL HIGH (ref 210–475)
Fibrinogen: 711 mg/dL — ABNORMAL HIGH (ref 210–475)

## 2019-06-27 LAB — POCT ACTIVATED CLOTTING TIME
Activated Clotting Time: 230 seconds
Activated Clotting Time: 191 seconds
Activated Clotting Time: 175 seconds

## 2019-06-27 LAB — GLUCOSE, CAPILLARY
Glucose-Capillary: 100 mg/dL — ABNORMAL HIGH (ref 70–99)
Glucose-Capillary: 100 mg/dL — ABNORMAL HIGH (ref 70–99)
Glucose-Capillary: 109 mg/dL — ABNORMAL HIGH (ref 70–99)

## 2019-06-27 LAB — MAGNESIUM: Magnesium: 1 mg/dL — ABNORMAL LOW (ref 1.7–2.4)

## 2019-06-27 SURGERY — PERIPHERAL VASCULAR THROMBECTOMY
Anesthesia: LOCAL | Laterality: Left

## 2019-06-27 MED ORDER — SODIUM CHLORIDE 0.9 % IV SOLN
1.0000 g | Freq: Three times a day (TID) | INTRAVENOUS | Status: AC
  Administered 2019-06-27 – 2019-07-03 (×19): 1 g via INTRAVENOUS
  Filled 2019-06-27 (×21): qty 1

## 2019-06-27 MED ORDER — POTASSIUM CHLORIDE 10 MEQ/100ML IV SOLN
10.0000 meq | INTRAVENOUS | Status: AC
  Administered 2019-06-27 (×2): 10 meq via INTRAVENOUS
  Filled 2019-06-27 (×3): qty 100

## 2019-06-27 MED ORDER — HEPARIN (PORCINE) IN NACL 1000-0.9 UT/500ML-% IV SOLN
INTRAVENOUS | Status: DC | PRN
  Administered 2019-06-27: 500 mL

## 2019-06-27 MED ORDER — HEPARIN (PORCINE) 25000 UT/250ML-% IV SOLN
800.0000 [IU]/h | INTRAVENOUS | Status: DC
  Administered 2019-06-27: 800 [IU]/h via INTRAVENOUS
  Filled 2019-06-27: qty 250

## 2019-06-27 MED ORDER — IODIXANOL 320 MG/ML IV SOLN
INTRAVENOUS | Status: DC | PRN
  Administered 2019-06-27: 40 mL via INTRA_ARTERIAL

## 2019-06-27 MED ORDER — LIDOCAINE HCL (PF) 1 % IJ SOLN
INTRAMUSCULAR | Status: DC | PRN
  Administered 2019-06-27: 30 mL

## 2019-06-27 MED ORDER — HEPARIN SODIUM (PORCINE) 1000 UNIT/ML IJ SOLN
INTRAMUSCULAR | Status: DC | PRN
  Administered 2019-06-27: 5000 [IU] via INTRAVENOUS

## 2019-06-27 MED ORDER — VANCOMYCIN HCL IN DEXTROSE 750-5 MG/150ML-% IV SOLN
750.0000 mg | INTRAVENOUS | Status: AC
  Administered 2019-06-27 – 2019-07-03 (×7): 750 mg via INTRAVENOUS
  Filled 2019-06-27 (×8): qty 150

## 2019-06-27 SURGICAL SUPPLY — 9 items
BALLN STERLING OTW 3X20X150 (BALLOONS) ×2
BALLOON STERLING OTW 3X20X150 (BALLOONS) IMPLANT
CATH CXI SUPP 2.6F 150 ST (CATHETERS) ×1 IMPLANT
DEVICE CONTINUOUS FLUSH (MISCELLANEOUS) ×1 IMPLANT
KIT ENCORE 26 ADVANTAGE (KITS) ×1 IMPLANT
KIT PV (KITS) ×1 IMPLANT
TRANSDUCER W/STOPCOCK (MISCELLANEOUS) IMPLANT
TRAY PV CATH (CUSTOM PROCEDURE TRAY) ×1 IMPLANT
WIRE G V18X300CM (WIRE) ×1 IMPLANT

## 2019-06-27 NOTE — Progress Notes (Signed)
ANTICOAGULATION CONSULT NOTE   Pharmacy Consult for heparin  Indication: occluded left femoral-popliteal bypass graft at the proximal thigh  Allergies  Allergen Reactions  . Penicillins Swelling    Has patient had a PCN reaction causing immediate rash, facial/tongue/throat swelling, SOB or lightheadedness with hypotension: YES Has patient had a PCN reaction causing severe rash involving mucus membranes or skin necrosis: NO Has patient had a PCN reaction that required hospitalization NO Has patient had a PCN reaction occurring within the last 10 years: NO If all of the above answers are "NO", then may proceed with Cephalosporin use.    Patient Measurements: Height: 5\' 4"  (162.6 cm) Weight: 147 lb (66.7 kg) IBW/kg (Calculated) : 54.7 Heparin Dosing Weight: used 67.6 kg from 03/05/19  Vital Signs: Temp: 99.1 F (37.3 C) (10/08 0823) Temp Source: Oral (10/08 0823) BP: 130/74 (10/08 0700) Pulse Rate: 95 (10/08 0700)  Labs: Recent Labs    06/26/19 0514 06/26/19 0938 06/26/19 2351 06/27/19 0441 06/27/19 0544  HGB 8.0*  --  8.7* 8.7*  --   HCT 23.3*  --  26.2* 25.6*  --   PLT 325  --  375 324  --   LABPROT 15.4*  --   --   --   --   INR 1.2  --   --   --   --   HEPARINUNFRC 0.63 0.91* 0.49 0.43  --   CREATININE 2.03*  --   --  1.29* 1.24*    Estimated Creatinine Clearance: 41.4 mL/min (A) (by C-G formula based on SCr of 1.24 mg/dL (H)).   Medical History: Past Medical History:  Diagnosis Date  . Clotting disorder ([place])   . Hyperlipidemia   . Hypertension   . Peripheral vascular disease ([place])    vein surgery, left leg  . Stroke [facility]) 2002   ongoing mild loss of feeling in left side, had to learn to write right-handed  . Substance abuse ([place])   . Tobacco abuse     Assessment: 68 yo F to on heparin  for occluded left femoral-popliteal bypass graft at the proximal thigh.  No anticoagulants PTA.   She is now s/p procedure with arterial thrombolysis (tPA @ 1mg /hr).   Spoke with Dr. Carlis Abbott and plans are to keep heparin at 800 units/hr while on tPA. Back to lab on 10/8   Goal of Therapy:  No titration unless heparin level > 0.5 Monitor platelets by anticoagulation protocol: Yes   Plan:  -Continue heparin at 800 units/hr -CBC, heparin level and fibrinogen in 6hrs -Will follow plans post lysis check  Hildred Laser, PharmD Clinical Pharmacist **Pharmacist phone directory can now be found on [email or web address] ([email or web address]).  Listed under [place].

## 2019-06-27 NOTE — Progress Notes (Signed)
Dressing reinforced on left foot. Moderate amount of bloody drainage noted.

## 2019-06-27 NOTE — Progress Notes (Signed)
K result 3.3 from this morning @ 0400, frequent ectopy noted. Mg added to early morning labs. MD paged for K replacement

## 2019-06-27 NOTE — Progress Notes (Signed)
Pharmacy Antibiotic Note  Wendy Townsend is a 68 y.o. female admitted on 06/25/2019 with wound infection.  Pharmacy has been consulted for vancomycin + aztreonam dosing. -WBC= 18.3, afeb, SCr= 1.24 (down from 3.77; baseline ~ 1.1-1.5)    Plan: -Continue Aztreonam 1 g IV q8h -Change vancomycin to 750mg  IV q24h (AUC= 439 w/ SCr= 1.24) -Will follow renal function, cultures and clinical progress   Height: 5\' 4"  (162.6 cm) Weight: 147 lb (66.7 kg) IBW/kg (Calculated) : 54.7  Temp (24hrs), Avg:98.8 F (37.1 C), Min:98.2 F (36.8 C), Max:99.4 F (37.4 C)  Recent Labs  Lab 06/25/19 1434 06/25/19 1642 06/26/19 0514 06/26/19 2351 06/27/19 0441 06/27/19 0544  WBC 17.5*  --  15.1* 16.1* 18.3*  --   CREATININE 3.77*  --  2.03*  --  1.29* 1.24*  LATICACIDVEN 1.7 1.6  --   --   --   --     Estimated Creatinine Clearance: 41.4 mL/min (A) (by C-G formula based on SCr of 1.24 mg/dL (H)).    Allergies  Allergen Reactions  . Penicillins Swelling    Has patient had a PCN reaction causing immediate rash, facial/tongue/throat swelling, SOB or lightheadedness with hypotension: YES Has patient had a PCN reaction causing severe rash involving mucus membranes or skin necrosis: NO Has patient had a PCN reaction that required hospitalization NO Has patient had a PCN reaction occurring within the last 10 years: NO If all of the above answers are "NO", then may proceed with Cephalosporin use.    Antimicrobials this admission: vancomycin 10/6 >>  aztreonam 10/6 >>   Dose adjustments this admission:  Microbiology results: 10/6 SARS-2: Negative  Thank you for allowing pharmacy to be a part of this patient's care.  Hildred Laser, PharmD Clinical Pharmacist **Pharmacist phone directory can now be found on [email or web address] ([email or web address]).  Listed under [place].

## 2019-06-27 NOTE — Progress Notes (Signed)
[facility]  Subjective  -states she feels like she has a foot again.  Tolerated left lower extremity lysis overnight.   Objective 130/74 95 99.1 F (37.3 C) (Oral) (!) 23 98%  Intake/Output Summary (Last 24 hours) at 06/27/2019 0841 Last data filed at 06/27/2019 0600 Gross per 24 hour  Intake 1900.92 ml  Output 850 ml  Net 1050.92 ml    Sheath in right to left femorofemoral bypass with no hematoma. Left foot is wrapped. Limited ability to move foot.   Still no signals in left foot.  Laboratory Lab Results: Recent Labs    06/26/19 2351 06/27/19 0441  WBC 16.1* 18.3*  HGB 8.7* 8.7*  HCT 26.2* 25.6*  PLT 375 324   BMET Recent Labs    06/26/19 0514 06/27/19 0441 06/27/19 0544  NA 134*  --  140  K 3.3*  --  3.3*  CL 101  --  109  CO2 20*  --  19*  GLUCOSE 123*  --  112*  BUN 41*  --  24*  CREATININE 2.03* 1.29* 1.24*  CALCIUM 8.1*  --  7.9*    COAG Lab Results  Component Value Date   INR 1.2 06/26/2019   INR 1.43 07/14/2016   INR 1.15 07/07/2016   No results found for: PTT  Assessment/Planning:  68 year old female previous underwent aortobifemoral bypass.  Earlier this year the left limb occluded and she ultimately underwent right to left femorofemoral bypass and then thrombectomy of her left femoral to below-knee popliteal prosthetic bypass that was done at the time of her aortobifem.  Presented earlier this week and seen by Dr. Trula Slade with acute on chronic limb ischemia of the left lower extremity and occluded fem pop bypass.  Underwent thrombolysis last night through a femoral femoral approach and put a lytics catheter down her femoropopliteal bypass.  States her foot feels better today.  We will take her back for thrombolytics check today.  I still think she is at exceedingly high risk for limb loss.  Last Hgb 8.7.  Last fibrinogen 597.  Cr improving with hydration.  Marty Heck 06/27/2019 8:41 AM --

## 2019-06-27 NOTE — Plan of Care (Signed)
Continue to monitor

## 2019-06-27 NOTE — Progress Notes (Signed)
ANTICOAGULATION CONSULT NOTE   Pharmacy Consult for heparin  Indication: occluded left femoral-popliteal bypass graft at the proximal thigh  Allergies  Allergen Reactions  . Penicillins Swelling    Has patient had a PCN reaction causing immediate rash, facial/tongue/throat swelling, SOB or lightheadedness with hypotension: YES Has patient had a PCN reaction causing severe rash involving mucus membranes or skin necrosis: NO Has patient had a PCN reaction that required hospitalization NO Has patient had a PCN reaction occurring within the last 10 years: NO If all of the above answers are "NO", then may proceed with Cephalosporin use.    Patient Measurements: Height: 5\' 4"  (162.6 cm) Weight: 147 lb (66.7 kg) IBW/kg (Calculated) : 54.7 Heparin Dosing Weight: used 67.6 kg from 03/05/19  Vital Signs: Temp: 98.6 F (37 C) (10/08 1616) Temp Source: Oral (10/08 1616) BP: 100/76 (10/08 1616) Pulse Rate: 111 (10/08 1525)  Labs: Recent Labs    06/26/19 0514  06/26/19 2351 06/27/19 0441 06/27/19 0544 06/27/19 1025  HGB 8.0*  --  8.7* 8.7*  --  7.6*  HCT 23.3*  --  26.2* 25.6*  --  21.8*  PLT 325  --  375 324  --  270  LABPROT 15.4*  --   --   --   --   --   INR 1.2  --   --   --   --   --   HEPARINUNFRC 0.63   < > 0.49 0.43  --  0.37  CREATININE 2.03*  --   --  1.29* 1.24*  --    < > = values in this interval not displayed.    Estimated Creatinine Clearance: 41.4 mL/min (A) (by C-G formula based on SCr of 1.24 mg/dL (H)).   Medical History: Past Medical History:  Diagnosis Date  . Clotting disorder ([place])   . Hyperlipidemia   . Hypertension   . Peripheral vascular disease ([place])    vein surgery, left leg  . Stroke [facility]) 2002   ongoing mild loss of feeling in left side, had to learn to write right-handed  . Substance abuse ([place])   . Tobacco abuse     Assessment: 68 yo F to on heparin  for occluded left femoral-popliteal bypass graft at the proximal thigh.  No  anticoagulants PTA. Patient s/p arterial thrombolysis with tPA overnight.   10/8 PM - Patient s/p recheck of lysis found to have widely patent graft with no residual thrombus. Pharmacy consulted to resume IV Heparin 8 hours post sheath pull (occurred at 1535PM) and to maintain a low heparin level goal of 0.2 to 0.5.    Goal of Therapy:  Heparin level 0.2-0.5 units/ml Monitor platelets by anticoagulation protocol: Yes   Plan:  -Restart IV heparin at 800 units/hr at 2330 PM tonight.  -CBC and  heparin level in 6hrs -Follow-up long-term plans  Sloan Leiter, PharmD, BCPS, BCCCP Clinical Pharmacist Clinical phone 06/27/2019 until 10:30P - [phone] **Pharmacist phone directory can now be found on [email or web address] ([email or web address]).  Listed under [place].

## 2019-06-27 NOTE — Progress Notes (Signed)
Patient arrived to 4E room 15 at this time. Telemetry applied and CCMD notified. V/s and assessment complete. CHG bath complete. Patient oriented to room and how to call nurse with any needs. Will continue to monitor.   Emelda Fear, RN

## 2019-06-27 NOTE — Progress Notes (Signed)
Site area- right  Site Prior to Removal- 0   Pressure Applied For-  20 MInutes   Bedrest Beginning at - 1535   Manual- Yes   Patient Status During Pull- Stable    Post Pull Groin Site- 0   Post Pull Instructions Given- Yes   Post Pull Pulses Present- Yes    Dressing Applied- Tegaderm and Gauze Dressing    Comments:  doppler pulses

## 2019-06-27 NOTE — Progress Notes (Signed)
PROGRESS NOTE    Wendy Townsend  [id number] DOB: 06-12-1951 DOA: 06/25/2019 PCP: Sandrea Hughs, NP    Brief Narrative: 68 year old woman PMH peripheral vascular disease, CKD stage III, stroke PRESENTED to [place] [facility] the emergency department with 2 days of severe foot pain. Ultrasound showed occlusion of left femoral popliteal bypass. EDP discussed with vascular surgery with recommendation for heparin and transferred to [facility]. Not clear that surgery will be required.  Patient reports she has had scaling skin since her surgery. She has had intermittent waxing and waning pain in her foot. However approximately 2 days ago she developed intense pain in the left foot, severe, unable to ambulate on it. Improved with pain medication in the emergency department. No specific aggravating or alleviating factors. No specific associated symptoms noted.  Chart review:  1/15/2020presented to the hospital with profound ischemia of her left foot with occlusion of her left limb of her aortofemoral graft and her left femoropopliteal bypass. She was taken emergently to the operating room where she underwent attempted thrombectomy of the left limb. This was not possible. She underwent right to left femorofemoral bypass and thrombectomy of her left femoral to popliteal bypass both through the groin and through the below-knee popliteal level. She did well in the hospital and was discharged home  ED Course:Notes: Presented from home, hard time walking secondary to left leg pain. EDP noted patient presented with questionable wound infection left leg/foot, dealing with dry skin. 1 2 weeks things have been getting worse. Per EMS fever 101.2. EDP was unable to palpate DP, PT pulse. Unable to obtain dopplerable pulses DP, PT, popliteal. Femoral pulses noted.  06/26/2019 patient resting in bed complaining of severe left lower extremity pain  10/8 s/p thrombolysis 10/7 some improvement but still  with lots of pain  Assessment & Plan:   Principal Problem:   Left foot pain Active Problems:   Ischemic pain of foot, left   AKI (acute kidney injury) ([place])   Dehydration   CKD (chronic kidney disease), stage III   Anemia in chronic kidney disease (CKD)    #1 left lower extremity ischemic pain secondary to occluded left femoropopliteal bypass   he is status post thrombolysis 06/26/2019 Defer to vascular if she needs to continue vancomycin and aztreonam.   Noted worsening leukocytosis.  Patient to go back to the OR for thrombolytics check 06/27/2019 we will follow closely.  Continue IV heparin.  #2 AKI on CKD stage III significantly improved with IV fluids creatinine down to 1.24 from 3.77 upon admission.  Continue IV fluids but at a lower rate.  Continue to hold HCTZ and ACE inhibitor.     #3 hypertension blood pressure improved with IV fluids.  Hold Coreg, Catapres, Zestril, hydrochlorothiazide.   #4 history of stroke with mild left-sided weakness on aspirin at home.  Currently on IV heparin  #5 history of tobacco use and substance abuse counseled.  #6 anemia of chronic disease hemoglobin dropped from 9.6-8.7 possibly hemodilution monitor closely patient is on IV heparin.  #7 hyponatremia improved with normal saline patient was dry and dehydrated at the time of admission in addition to being on hydrochlorothiazide and ACE.  #8 hypokalemia replete   DVT prophylaxis: Heparin IV  code Status: Full code  family Communication: No family available Disposition Plan: Pending clinical improvement and possible surgery or thrombolysis Consultants:   Vascular surgery  Procedures:  Thrombolysis 06/26/2019 Antimicrobials:     Estimated body mass index is 25.23 kg/m as calculated from the  following:   Height as of this encounter: 5\' 4"  (1.626 m).   Weight as of this encounter: 66.7 kg.    Subjective:  Patient resting in bed staff reports severe pain overnight not able to  keep leg straight  Objective: Vitals:   06/27/19 0500 06/27/19 0610 06/27/19 0700 06/27/19 0823  BP: (!) 133/59 121/77 130/74   Pulse: 94 90 95   Resp: 16 18 (!) 23   Temp:    99.1 F (37.3 C)  TempSrc:    Oral  SpO2: 98% 99% 98%   Weight:      Height:        Intake/Output Summary (Last 24 hours) at 06/27/2019 1047 Last data filed at 06/27/2019 0811 Gross per 24 hour  Intake 2029.16 ml  Output 850 ml  Net 1179.16 ml   Filed Weights   06/25/19 1826  Weight: 66.7 kg    Examination:  General exam: Appears calm and comfortable  Respiratory system: Clear to auscultation. Respiratory effort normal. Cardiovascular system: S1 & S2 heard, RRR. No JVD, murmurs, rubs, gallops or clicks. No pedal edema. Gastrointestinal system: Abdomen is nondistended, soft and nontender. No organomegaly or masses felt. Normal bowel sounds heard. Central nervous system: Alert and oriented. No focal neurological deficits. Extremities:LLE wrapped Skin: No rashes, lesions or ulcers Psychiatry: Judgement and insight appear normal. Mood & affect appropriate.     Data Reviewed: I have personally reviewed following labs and imaging studies  CBC: Recent Labs  Lab 06/25/19 1434 06/26/19 0514 06/26/19 2351 06/27/19 0441  WBC 17.5* 15.1* 16.1* 18.3*  NEUTROABS 14.1*  --   --   --   HGB 9.6* 8.0* 8.7* 8.7*  HCT 29.2* 23.3* 26.2* 25.6*  MCV 93.0 89.6 92.3 92.4  PLT 377 325 375 0000000   Basic Metabolic Panel: Recent Labs  Lab 06/25/19 1434 06/26/19 0514 06/27/19 0441 06/27/19 0544  NA 129* 134*  --  140  K 2.5* 3.3*  --  3.3*  CL 91* 101  --  109  CO2 24 20*  --  19*  GLUCOSE 116* 123*  --  112*  BUN 54* 41*  --  24*  CREATININE 3.77* 2.03* 1.29* 1.24*  CALCIUM 8.5* 8.1*  --  7.9*   GFR: Estimated Creatinine Clearance: 41.4 mL/min (A) (by C-G formula based on SCr of 1.24 mg/dL (H)). Liver Function Tests: Recent Labs  Lab 06/25/19 1434  AST 27  ALT 12  ALKPHOS 97  BILITOT 1.5*  PROT  7.9  ALBUMIN 3.5   No results for input(s): LIPASE, AMYLASE in the last 168 hours. No results for input(s): AMMONIA in the last 168 hours. Coagulation Profile: Recent Labs  Lab 06/26/19 0514  INR 1.2   Cardiac Enzymes: No results for input(s): CKTOTAL, CKMB, CKMBINDEX, TROPONINI in the last 168 hours. BNP (last 3 results) No results for input(s): PROBNP in the last 8760 hours. HbA1C: No results for input(s): HGBA1C in the last 72 hours. CBG: Recent Labs  Lab 06/26/19 2132 06/27/19 0039 06/27/19 0425 06/27/19 0819  GLUCAP 116* 109* 100* 100*   Lipid Profile: No results for input(s): CHOL, HDL, LDLCALC, TRIG, CHOLHDL, LDLDIRECT in the last 72 hours. Thyroid Function Tests: No results for input(s): TSH, T4TOTAL, FREET4, T3FREE, THYROIDAB in the last 72 hours. Anemia Panel: No results for input(s): VITAMINB12, FOLATE, FERRITIN, TIBC, IRON, RETICCTPCT in the last 72 hours. Sepsis Labs: Recent Labs  Lab 06/25/19 1434 06/25/19 1642  LATICACIDVEN 1.7 1.6    Recent Results (from  the past 240 hour(s))  SARS Coronavirus 2 [facility] order, Performed in [facility] hospital lab) Nasopharyngeal Nasopharyngeal Swab     Status: None   Collection Time: 06/25/19  2:57 PM   Specimen: Nasopharyngeal Swab  Result Value Ref Range Status   SARS Coronavirus 2 NEGATIVE NEGATIVE Final    Comment: (NOTE) If result is NEGATIVE SARS-CoV-2 target nucleic acids are NOT DETECTED. The SARS-CoV-2 RNA is generally detectable in upper and lower  respiratory specimens during the acute phase of infection. The lowest  concentration of SARS-CoV-2 viral copies this assay can detect is 250  copies / mL. A negative result does not preclude SARS-CoV-2 infection  and should not be used as the sole basis for treatment or other  patient management decisions.  A negative result may occur with  improper specimen collection / handling, submission of specimen other  than nasopharyngeal swab, presence of viral  mutation(s) within the  areas targeted by this assay, and inadequate number of viral copies  (<250 copies / mL). A negative result must be combined with clinical  observations, patient history, and epidemiological information. If result is POSITIVE SARS-CoV-2 target nucleic acids are DETECTED. The SARS-CoV-2 RNA is generally detectable in upper and lower  respiratory specimens dur ing the acute phase of infection.  Positive  results are indicative of active infection with SARS-CoV-2.  Clinical  correlation with patient history and other diagnostic information is  necessary to determine patient infection status.  Positive results do  not rule out bacterial infection or co-infection with other viruses. If result is PRESUMPTIVE POSTIVE SARS-CoV-2 nucleic acids MAY BE PRESENT.   A presumptive positive result was obtained on the submitted specimen  and confirmed on repeat testing.  While 2019 novel coronavirus  (SARS-CoV-2) nucleic acids may be present in the submitted sample  additional confirmatory testing may be necessary for epidemiological  and / or clinical management purposes  to differentiate between  SARS-CoV-2 and other Sarbecovirus currently known to infect humans.  If clinically indicated additional testing with an alternate test  methodology [id number]) is advised. The SARS-CoV-2 RNA is generally  detectable in upper and lower respiratory sp ecimens during the acute  phase of infection. The expected result is Negative. Fact Sheet for Patients:  StrictlyIdeas.no Fact Sheet for Healthcare Providers: BankingDealers.co.za This test is not yet approved or cleared by the [place] FDA and has been authorized for detection and/or diagnosis of SARS-CoV-2 by FDA under an Emergency Use Authorization (EUA).  This EUA will remain in effect (meaning this test can be used) for the duration of the COVID-19 declaration under Section 564(b)(1)  of the Act, 21 U.S.C. section 360bbb-3(b)(1), unless the authorization is terminated or revoked sooner. Performed at [facility], [place] [address], [place], [place] [address]          Radiology Studies: Dg Tibia/fibula Left  Result Date: 06/25/2019 CLINICAL DATA:  Pt with pain to lower extremity. Pt skin is weeping and flaking off in some areas from just below knee down to mid left foot. States she has a history of skin grafts to lower leg EXAM: LEFT TIBIA AND FIBULA - 2 VIEW COMPARISON:  None. FINDINGS: No fracture or bone lesion. Skeletal structures are demineralized. Knee and ankle joints are normally aligned. There are soft tissue defects along the anterolateral aspect of the ankle and lower leg. No radiopaque foreign body. There scattered vascular calcifications. IMPRESSION: 1. No fracture, bone lesion or evidence of osteomyelitis. Knee and ankle joints normally  aligned. 2. Superficial soft tissue defects along the anterolateral ankle and lower leg. Electronically Signed   By: Lajean Manes M.D.   On: 06/25/2019 15:51   Dg Ankle Complete Left  Result Date: 06/25/2019 CLINICAL DATA:  Pt with pain to lower extremity. Pt skin is weeping and flaking off in some areas from just below knee down to mid left foot. States she has a history of skin grafts to lower leg EXAM: LEFT ANKLE COMPLETE - 3+ VIEW COMPARISON:  None. FINDINGS: No fracture or bone lesion. Skeletal structures are diffusely demineralized. No bone resorption to suggest osteomyelitis. Ankle joint normally spaced and aligned.  No arthropathic changes. They were are superficial soft tissue defects along the anterolateral aspect of the ankle. No soft tissue air. No radiopaque foreign body. IMPRESSION: 1. No fracture, bone lesion or evidence of osteomyelitis. 2. Superficial to soft tissue defects along the anterolateral ankle. Electronically Signed   By: Lajean Manes M.D.   On: 06/25/2019 15:50   Dg Foot Complete  Left  Result Date: 06/25/2019 CLINICAL DATA:  Pt with pain to lower extremity. Pt skin is weeping and flaking off in some areas from just below knee down to mid left foot. States she has a history of skin grafts to lower leg EXAM: LEFT FOOT - COMPLETE 3+ VIEW COMPARISON:  None. FINDINGS: No fracture or bone lesion. Skeletal structures are diffusely demineralized. Absent distal phalanges of the second and fifth toes with small tapering middle phalanges. Findings consistent with previous distal toe amputations. There are no areas of bone resorption to suggest osteomyelitis. Joints are normally spaced and aligned. Soft tissues are unremarkable. IMPRESSION: 1. No fracture or acute finding.  No evidence of osteomyelitis. Electronically Signed   By: Lajean Manes M.D.   On: 06/25/2019 15:48   Vas [place] Lower Extremity Bypass Graft Dupl  Result Date: 06/26/2019 LOWER EXTREMITY ARTERIAL DUPLEX STUDY Indications: Gangrene to left foot and tibial area, and peripheral artery              disease.  Current ABI: Unable to obtain secondary to wounds Limitations: Patient unable to position properly secondary to pain Comparison Study: 10/04/2018 ABI- right =0.65, left= 0.65 Performing Technologist: Maudry Mayhew MHA, RDMS, RVT, RDCS  Examination Guidelines: A complete evaluation includes B-mode imaging, spectral Doppler, color Doppler, and power Doppler as needed of all accessible portions of each vessel. Bilateral testing is considered an integral part of a complete examination. Limited examinations for reoccurring indications may be performed as noted.  Right Graft #1: Right to left femoral-femoral bypass graft +------------------+--------+--------+----------+-------------------+                     PSV cm/s Stenosis Waveform   Comments             +------------------+--------+--------+----------+-------------------+  Inflow                                          Unable to visualize   +------------------+--------+--------+----------+-------------------+  Prox Anastomosis   45                monophasic                      +------------------+--------+--------+----------+-------------------+  Proximal Graft     72                monophasic                      +------------------+--------+--------+----------+-------------------+  Mid Graft          36                monophasic                      +------------------+--------+--------+----------+-------------------+  Distal Graft       52                monophasic                      +------------------+--------+--------+----------+-------------------+  Distal Anastomosis 188               monophasic                      +------------------+--------+--------+----------+-------------------+  Outflow                                         Unable to visualize  +------------------+--------+--------+----------+-------------------+  Left Graft #1: Left femoral-popliteal bypass graft +--------------------+--------+--------+----------+----------------------------+                       PSV cm/s Stenosis Waveform   Comments                      +--------------------+--------+--------+----------+----------------------------+  Inflow                                            Unable to visualize           +--------------------+--------+--------+----------+----------------------------+  Proximal Anastomosis 16                monophasic                               +--------------------+--------+--------+----------+----------------------------+  Proximal Graft       47                monophasic Occluded just distal to                                                          proximal segment.             +--------------------+--------+--------+----------+----------------------------+  Mid Graft                     occluded                                          +--------------------+--------+--------+----------+----------------------------+  Distal Graft                   occluded                                          +--------------------+--------+--------+----------+----------------------------+  Distal Anastomosis  occluded                                          +--------------------+--------+--------+----------+----------------------------+  Outflow                                           Unable to visualize           +--------------------+--------+--------+----------+----------------------------+  Summary: Right: Patent right to left femoral-femoral bypass graft. Left: Color Doppler and pulsed wave Doppler suggest occluded left femoral-popliteal bypass graft at the proximal thigh. Multiple heterogenous areas visualized in the left groin, suggestive of prominent inguinal lymph nodes.  See table(s) above for measurements and observations. Electronically signed by Harold Barban MD on 06/26/2019 at 6:59:33 AM.    Final         Scheduled Meds:  aspirin EC  81 mg Oral Daily   Chlorhexidine Gluconate Cloth  6 each Topical Daily   sodium chloride flush  3 mL Intravenous Q12H   sodium chloride flush  3 mL Intravenous Q12H   Continuous Infusions:  sodium chloride 150 mL/hr at 06/26/19 1846   sodium chloride 100 mL/hr at 06/27/19 0810   sodium chloride     alteplase (LIMB ISCHEMIA) 10 mg in normal saline (0.02 mg/mL) infusion 1 mg/hr (06/27/19 0811)   aztreonam 200 mL/hr at 06/27/19 0806   heparin 800 Units/hr (06/27/19 0811)   vancomycin       LOS: 2 days    Georgette Shell, MD [facility]  If 7PM-7AM, please contact night-coverage [email or web address] Password [email or web address] 06/27/2019, 10:47 AM

## 2019-06-27 NOTE — Op Note (Signed)
Patient name: Wendy Townsend MRN: [id number] DOB: April 18, 1951 Sex: female  06/27/2019 Pre-operative Diagnosis: Thrombosed left femoral to below-knee popliteal prosthetic bypass with acute on chronic limb ischemia Post-operative diagnosis:  Same Surgeon:  Marty Heck, MD Procedure Performed: 1.  Thrombolytics catheter recheck of the left lower extremity with thrombolysis catheter in the left femoral to below-knee popliteal prosthetic bypass 2.  Left below-knee popliteal artery angioplasty (3 mm x 20 mm Sterling)   Indications: Patient is a 68 year old female well-known to the vascular surgery service.  She previously had a aortobifemoral bypass and left femoral to below knee prosthetic bypass.  Ultimately the left limb of this aortobifemoral bypass occluded earlier this year and she underwent right to left femoral-femoral bypass with Dr. Donnetta Hutching and then thrombectomy of her left femoral to below-knee prosthetic bypass and this was re-implanted on the femoral-femoral bypass.  She was seen in the ED by Dr. Trula Slade and had acute on chronic limb ischemia of her left lower extremity with thrombosis of the femoral to popliteal bypass in her left leg.  A thrombolytics catheter was placed in this yesterday.  Findings:   The left femoral to below-knee popliteal prosthetic bypass is now widely patent with no residual thrombus after thrombolysis overnight.  There is outflow via the anterior tibial and peroneal artery that are very small.  The posterior tibial is patent proximally but then occludes just above the ankle.  I did not see any focal flow-limiting stenosis in the peroneal or anterior tibial although both arteries are very small, calcified, and diseased.  I did angioplasty the distal graft anastomosis onto the below-knee popliteal artery where the popliteal artery is very small and I had some difficulty getting my wire to cross this area.  Patient has preserved two-vessel runoff via the  anterior tibial and peroneal artery with a very brisk dorsalis pedis signal in the left foot.   Procedure:  The patient was identified in the holding area and taken to room 8.  The patient was then placed supine on the table and prepped and draped in the usual sterile fashion.  A time out was called.  Initially removed the inner cannula from the UniFuse catheter that was placed in the sheath of the femoral-femoral bypass down the left femoropopliteal bypass.  I aspirate the catheter and it drew back briskly with bright red arterial blood.  I then took a picture by hand injecting contrast into the catheter.  There was brisk flow in the distal extent of the bypass into the below-knee popliteal artery and then down the anterior tibial and peroneal artery.  Ultimately we took multiple views with a lateral foot shot to ensure there were no focal lesion that needed intervention.  Ultimately I elected to place a V 18 wire down with a CXI catheter and got into the posterior tibial artery and across the graft anastomosis and did hand-injection to ensure that I was in the true lumen.  Ultimately elected to angioplasty the distal graft anastomosis onto the below knee popliteal artery where the vessel was very small with a 3 mm x 20 mm Sterling to nominal pressure for 2 minutes.  She was given 5000 units of IV heparin prior to this.  Another injection through the sheath in the femoral-femoral bypass showed brisk flow down the left femoral to below-knee prosthetic bypass that was widely patent with preserved two-vessel runoff in the anterior tibial and peroneal after intervention.  At that point in time wires and catheters were  removed.  I did complete imaging of the femoral femoral bypass in the left groin as well as the proximal to mid extent of the bypass to ensure there were no residual thrombus or other flow limiting lesions which I did not see.  Ultimately an ACT was checked and she will have her sheath removed in  holding.     Marty Heck, MD [facility] Office: [phone] Pager: [phone]

## 2019-06-28 ENCOUNTER — Encounter (HOSPITAL_COMMUNITY): Payer: Self-pay | Admitting: Vascular Surgery

## 2019-06-28 LAB — CBC
HCT: 16.3 % — ABNORMAL LOW (ref 36.0–46.0)
Hemoglobin: 5.5 g/dL — CL (ref 12.0–15.0)
MCH: 31.4 pg (ref 26.0–34.0)
MCHC: 33.7 g/dL (ref 30.0–36.0)
MCV: 93.1 fL (ref 80.0–100.0)
Platelets: 230 10*3/uL (ref 150–400)
RBC: 1.75 MIL/uL — ABNORMAL LOW (ref 3.87–5.11)
RDW: 14.2 % (ref 11.5–15.5)
WBC: 22.7 10*3/uL — ABNORMAL HIGH (ref 4.0–10.5)
nRBC: 0 % (ref 0.0–0.2)

## 2019-06-28 LAB — COMPREHENSIVE METABOLIC PANEL
ALT: 10 U/L (ref 0–44)
AST: 16 U/L (ref 15–41)
Albumin: 1.9 g/dL — ABNORMAL LOW (ref 3.5–5.0)
Alkaline Phosphatase: 69 U/L (ref 38–126)
Anion gap: 10 (ref 5–15)
BUN: 25 mg/dL — ABNORMAL HIGH (ref 8–23)
CO2: 18 mmol/L — ABNORMAL LOW (ref 22–32)
Calcium: 7.2 mg/dL — ABNORMAL LOW (ref 8.9–10.3)
Chloride: 109 mmol/L (ref 98–111)
Creatinine, Ser: 1.32 mg/dL — ABNORMAL HIGH (ref 0.44–1.00)
GFR calc Af Amer: 48 mL/min — ABNORMAL LOW (ref >60)
GFR calc non Af Amer: 42 mL/min — ABNORMAL LOW (ref >60)
Glucose, Bld: 145 mg/dL — ABNORMAL HIGH (ref 70–99)
Potassium: 3.4 mmol/L — ABNORMAL LOW (ref 3.5–5.1)
Sodium: 137 mmol/L (ref 135–145)
Total Bilirubin: 0.9 mg/dL (ref 0.3–1.2)
Total Protein: 5.2 g/dL — ABNORMAL LOW (ref 6.5–8.1)

## 2019-06-28 LAB — HEMOGLOBIN AND HEMATOCRIT, BLOOD
HCT: 26.9 % — ABNORMAL LOW (ref 36.0–46.0)
Hemoglobin: 9.7 g/dL — ABNORMAL LOW (ref 12.0–15.0)

## 2019-06-28 LAB — CBC WITH DIFFERENTIAL/PLATELET
Abs Immature Granulocytes: 0.26 10*3/uL — ABNORMAL HIGH (ref 0.00–0.07)
Basophils Absolute: 0 10*3/uL (ref 0.0–0.1)
Basophils Relative: 0 %
Eosinophils Absolute: 0 10*3/uL (ref 0.0–0.5)
Eosinophils Relative: 0 %
HCT: 16.9 % — ABNORMAL LOW (ref 36.0–46.0)
Hemoglobin: 5.8 g/dL — CL (ref 12.0–15.0)
Immature Granulocytes: 1 %
Lymphocytes Relative: 6 %
Lymphs Abs: 1.6 10*3/uL (ref 0.7–4.0)
MCH: 31.5 pg (ref 26.0–34.0)
MCHC: 34.3 g/dL (ref 30.0–36.0)
MCV: 91.8 fL (ref 80.0–100.0)
Monocytes Absolute: 1.8 10*3/uL — ABNORMAL HIGH (ref 0.1–1.0)
Monocytes Relative: 7 %
Neutro Abs: 20.9 10*3/uL — ABNORMAL HIGH (ref 1.7–7.7)
Neutrophils Relative %: 86 %
Platelets: 218 10*3/uL (ref 150–400)
RBC: 1.84 MIL/uL — ABNORMAL LOW (ref 3.87–5.11)
RDW: 14.1 % (ref 11.5–15.5)
WBC: 24.5 10*3/uL — ABNORMAL HIGH (ref 4.0–10.5)
nRBC: 0 % (ref 0.0–0.2)

## 2019-06-28 LAB — MRSA PCR SCREENING: MRSA by PCR: NEGATIVE

## 2019-06-28 LAB — MAGNESIUM
Magnesium: 0.9 mg/dL — CL (ref 1.7–2.4)
Magnesium: 2.5 mg/dL — ABNORMAL HIGH (ref 1.7–2.4)

## 2019-06-28 LAB — CREATININE, SERUM
Creatinine, Ser: 1.32 mg/dL — ABNORMAL HIGH (ref 0.44–1.00)
GFR calc Af Amer: 48 mL/min — ABNORMAL LOW (ref >60)
GFR calc non Af Amer: 42 mL/min — ABNORMAL LOW (ref >60)

## 2019-06-28 LAB — HEPARIN LEVEL (UNFRACTIONATED): Heparin Unfractionated: <0.1 IU/mL — ABNORMAL LOW (ref 0.30–0.70)

## 2019-06-28 LAB — PREPARE RBC (CROSSMATCH)

## 2019-06-28 MED ORDER — MAGNESIUM SULFATE 2 GM/50ML IV SOLN
2.0000 g | Freq: Once | INTRAVENOUS | Status: AC
  Administered 2019-06-28: 2 g via INTRAVENOUS
  Filled 2019-06-28: qty 50

## 2019-06-28 MED ORDER — MAGNESIUM SULFATE 4 GM/100ML IV SOLN
4.0000 g | Freq: Once | INTRAVENOUS | Status: AC
  Administered 2019-06-28: 09:00:00 4 g via INTRAVENOUS
  Filled 2019-06-28: qty 100

## 2019-06-28 MED ORDER — POTASSIUM CHLORIDE 10 MEQ/100ML IV SOLN
10.0000 meq | INTRAVENOUS | Status: AC
  Administered 2019-06-28 (×3): 10 meq via INTRAVENOUS
  Filled 2019-06-28 (×3): qty 100

## 2019-06-28 MED ORDER — SODIUM CHLORIDE 0.9% IV SOLUTION
Freq: Once | INTRAVENOUS | Status: AC
  Administered 2019-06-28: 03:00:00 via INTRAVENOUS

## 2019-06-28 NOTE — Progress Notes (Signed)
Left foot dressing opened by Dr.Clark to assess DP pulse. No order for dressing care in chart. Old dressing reinforced. Paged MD for new orders at this time.

## 2019-06-28 NOTE — Progress Notes (Addendum)
  Date and time results received: 06/28/19 at 0212am  Test: CBC and BMP  Critical Value:  Hb 5.8 , Mg 0.9  Name of Provider Notified: on call provider Jeannette Corpus, NP  Orders Received for blood tranfusion 2 units of PRBC and  Magnesium sulfate 2 gm and stopped Heparin gtt.   Re assessed Pt was negative for retroperitoneal bleeding and right groin hematoma, Verified with 2nd RN Ms.Garnet and I. Pt denied pain. Abdominal soft and no tenderness. We will continue to monitor.  Kennyth Lose, RN

## 2019-06-28 NOTE — Progress Notes (Signed)
Pt appeared alert and oriented x 4, HR 110-120, CCMD called and notified Pt had multiple time of unsustained short V-tach, HR 170-180. Her BP remained normal, Pt is asymptomatic. SPO2 100% with room air. Her puncture side on right groin was negative for hematoma and Pt denied pian.   I notified on-call provider, Jeannette Corpus, NP. New order received. Dr. Carlis Abbott vascular made aware. Will continue to monitor.  Kennyth Lose, RN

## 2019-06-28 NOTE — Progress Notes (Signed)
Peripheral left upper arm IV removed due to infiltration of blood transfusion. Mild infiltration noted around site.  Blood transfusion moved to right upper arm IV. Will continue to monitor.

## 2019-06-28 NOTE — Care Management Important Message (Signed)
Important Message  Patient Details  Name: Tru Sainz MRN: [id number] Date of Birth: 1951/02/28   Medicare Important Message Given:  Yes     Shelda Altes 06/28/2019, 1:25 PM

## 2019-06-28 NOTE — Progress Notes (Addendum)
Called by RN with concerns for arrhythmia on cardiac monitor. According to RN, pt having frequent short runs of SVT. Pt is asymptomatic. Denies chest pain discomfort. Currently sleeping and drowsy when awaken.Currently tachycardiac. VS otherwise stable. Dressing from recent surgical site is unremarkable and shows no signs of overt bleeding noted.  Arrhythmia - Stat CBC and CMP obtained. Hbg at 5.8 at this time. Transfuse 2 units of PRBC. Serial H/H every 6 hours ordered.  - Will hold Heparin gtt for now.  -  Vascular Surgery made aware of bedside RN about arrhythmia and frequent short runs of SVT. Will contact again if overt bleeding is noted, H/H continues to drop, or patient becomes symptomatic.   Lovey Newcomer, NP [facility] 7p-7a [phone]

## 2019-06-28 NOTE — Progress Notes (Signed)
[facility]  Subjective  -states her left foot feels much better.   Objective (!) 119/54 90 97.6 F (36.4 C) (Oral) 17 100%  Intake/Output Summary (Last 24 hours) at 06/28/2019 1225 Last data filed at 06/28/2019 0844 Gross per 24 hour  Intake 2609.55 ml  Output -  Net 2609.55 ml    Sheath access site in the right to left femorofemoral with no hematoma Left dorsalis pedis brisk by Doppler Foot is foul-smelling  Laboratory Lab Results: Recent Labs    06/28/19 0108 06/28/19 0347  WBC 24.5* 22.7*  HGB 5.8* 5.5*  HCT 16.9* 16.3*  PLT 218 230   BMET Recent Labs    06/27/19 0544 06/28/19 0108 06/28/19 0347  NA 140 137  --   K 3.3* 3.4*  --   CL 109 109  --   CO2 19* 18*  --   GLUCOSE 112* 145*  --   BUN 24* 25*  --   CREATININE 1.24* 1.32* 1.32*  CALCIUM 7.9* 7.2*  --     COAG Lab Results  Component Value Date   INR 1.2 06/26/2019   INR 1.43 07/14/2016   INR 1.15 07/07/2016   No results found for: PTT  Assessment/Planning: POD #1 status post thrombolytics check of the left lower extremity.  Fortunately we were able to get her left fem below-knee prosthetic bypass open with thrombolysis by accessing the femorofemoral bypass.  She states her foot feels much better.  She has a very brisk dorsalis pedis by Doppler.  I think the foot smells very foul and I have discussed with her the option of amputation given the extent of tissue loss but she is not interested in that at this time.  I do think she needs to be transitioned to long-term anticoagulation given high risk for graft failure.  Can leave this at the discretion of the primary team with what ever she can access regarding cost.  Marty Heck 06/28/2019 12:25 PM --

## 2019-06-28 NOTE — Progress Notes (Signed)
PROGRESS NOTE    Wendy Townsend  [id number] DOB: May 25, 1951 DOA: 06/25/2019 PCP: Sandrea Hughs, NP    Brief Narrative: 68 year old woman PMH peripheral vascular disease, CKD stage III, stroke PRESENTED to [place] long the emergency department with 2 days of severe foot pain. Ultrasound showed occlusion of left femoral popliteal bypass. EDP discussed with vascular surgery with recommendation for heparin and transferred to [facility]. Not clear that surgery will be required.  Patient reports she has had scaling skin since her surgery. She has had intermittent waxing and waning pain in her foot. However approximately 2 days ago she developed intense pain in the left foot, severe, unable to ambulate on it. Improved with pain medication in the emergency department. No specific aggravating or alleviating factors. No specific associated symptoms noted.  Chart review:  1/15/2020presented to the hospital with profound ischemia of her left foot with occlusion of her left limb of her aortofemoral graft and her left femoropopliteal bypass. She was taken emergently to the operating room where she underwent attempted thrombectomy of the left limb. This was not possible. She underwent right to left femorofemoral bypass and thrombectomy of her left femoral to popliteal bypass both through the groin and through the below-knee popliteal level. She did well in the hospital and was discharged home  ED Course:Notes: Presented from home, hard time walking secondary to left leg pain. EDP noted patient presented with questionable wound infection left leg/foot, dealing with dry skin. 1 2 weeks things have been getting worse. Per EMS fever 101.2. EDP was unable to palpate DP, PT pulse. Unable to obtain dopplerable pulses DP, PT, popliteal. Femoral pulses noted.  06/26/2019 patient resting in bed complaining of severe left lower extremity pain  10/8 s/p thrombolysis 10/7 some improvement but still  with  lots of pain  06/28/2019 patient resting in bed does complain of pain in the left lower extremity.  Overnight events noted with arrhythmias .Marland Kitchen Now in sinus tach.. Hemoglobin was 5.82 units of blood transfusion ordered hypokalemia noted IV potassium ordered, magnesium severely low at 0.9 IV magnesium ordered   Assessment & Plan:   Principal Problem:   Left foot pain Active Problems:   Ischemic pain of foot, left   AKI (acute kidney injury) ([place])   Dehydration   CKD (chronic kidney disease), stage III   Anemia in chronic kidney disease (CKD)   #1 left lower extremity ischemia status post left below-knee popliteal artery angioplasty and thrombolysis patient was on IV heparin which was stopped overnight due to acute drop in hemoglobin with arrhythmias.   #2 AKI on CKD stage III significantly improved with IV fluids creatinine down to 1.32 from 3.77 upon admission.  Continue IV fluids but at a lower rate.  Continue to hold HCTZ and ACE inhibitor.     #3 hypertension blood pressure improved with IV fluids.   Blood pressure remains soft.  Continue to hold antihypertensives Coreg, hydrochlorothiazide, Catapres, Zestril.   #4 history of stroke with mild left-sided weakness on aspirin at home.   #5 history of tobacco use and substance abuse counseled.  #6  Acute blood loss anemia hemoglobin early this morning was 5.52 units of blood transfusion ordered she is currently getting her first unit will recheck H&H after blood transfusion.  She also has a history of anemia of chronic disease.  IV heparin stopped.  #7 hyponatremia resolved  with normal saline patient was dry and dehydrated at the time of admission in addition to being on hydrochlorothiazide and ACE.  #  8 hypokalemia replete  #9 hypomagnesemia with a magnesium of 0.9 replete and recheck  #10 worsening leukocytosis patient has been on Vanco and aztreonam since admission due to significant leukocytosis and soft blood  pressure with elevated CRP on admission along with fever.  WBCs continues to worsen probably stress related.  However will check a UA and continue antibiotics for now.  Estimated body mass index is 25.23 kg/m as calculated from the following:   Height as of this encounter: 5\' 4"  (1.626 m).   Weight as of this encounter: 66.7 kg.  DVT prophylaxis:scd patient was on IV heparin this was stopped due to acute drop in hemoglobin code Status:Full code  family Communication:No family available Disposition Plan:Pending clinical improvement Consultants:  Vascular surgery  Procedures: Thrombolysis 06/26/2019 Left below-knee popliteal artery angioplasty 06/27/2019 Antimicrobials:  Subjective: Patient resting in bed appears to be in mild distress due to left lower extremity pain denies chest pain or shortness of breath  Objective: Vitals:   06/28/19 0956 06/28/19 1030 06/28/19 1041 06/28/19 1056  BP:  (!) 124/55  (!) 119/54  Pulse: (!) 55 95  90  Resp: 18 19 14 17   Temp:  98.1 F (36.7 C)  97.6 F (36.4 C)  TempSrc:  Oral  Oral  SpO2: 100% 96% 100% 100%  Weight:      Height:        Intake/Output Summary (Last 24 hours) at 06/28/2019 1120 Last data filed at 06/28/2019 0844 Gross per 24 hour  Intake 2609.55 ml  Output -  Net 2609.55 ml   Filed Weights   06/25/19 1826  Weight: 66.7 kg    Examination:  General exam: Appears calm and comfortable  Respiratory system: Clear to auscultation. Respiratory effort normal. Cardiovascular system: S1 & S2 heard, RRR. No JVD, murmurs, rubs, gallops or clicks. No pedal edema. Gastrointestinal system: Abdomen is nondistended, soft and nontender. No organomegaly or masses felt. Normal bowel sounds heard. Central nervous system: Alert and oriented. No focal neurological deficits. Extremities left lower extremity covered with surgical dressing  skin: No rashes, lesions or ulcers Psychiatry: Judgement and insight appear normal. Mood &  affect appropriate.     Data Reviewed: I have personally reviewed following labs and imaging studies  CBC: Recent Labs  Lab 06/25/19 1434  06/26/19 2351 06/27/19 0441 06/27/19 1025 06/28/19 0108 06/28/19 0347  WBC 17.5*   < > 16.1* 18.3* 18.1* 24.5* 22.7*  NEUTROABS 14.1*  --   --   --   --  20.9*  --   HGB 9.6*   < > 8.7* 8.7* 7.6* 5.8* 5.5*  HCT 29.2*   < > 26.2* 25.6* 21.8* 16.9* 16.3*  MCV 93.0   < > 92.3 92.4 90.5 91.8 93.1  PLT 377   < > 375 324 270 218 230   < > = values in this interval not displayed.   Basic Metabolic Panel: Recent Labs  Lab 06/25/19 1434 06/26/19 0514 06/27/19 0441 06/27/19 0544 06/27/19 1025 06/28/19 0108 06/28/19 0347  NA 129* 134*  --  140  --  137  --   K 2.5* 3.3*  --  3.3*  --  3.4*  --   CL 91* 101  --  109  --  109  --   CO2 24 20*  --  19*  --  18*  --   GLUCOSE 116* 123*  --  112*  --  145*  --   BUN 54* 41*  --  24*  --  25*  --   CREATININE 3.77* 2.03* 1.29* 1.24*  --  1.32* 1.32*  CALCIUM 8.5* 8.1*  --  7.9*  --  7.2*  --   MG  --   --   --   --  1.0* 0.9*  --    GFR: Estimated Creatinine Clearance: 38.8 mL/min (A) (by C-G formula based on SCr of 1.32 mg/dL (H)). Liver Function Tests: Recent Labs  Lab 06/25/19 1434 06/28/19 0108  AST 27 16  ALT 12 10  ALKPHOS 97 69  BILITOT 1.5* 0.9  PROT 7.9 5.2*  ALBUMIN 3.5 1.9*   No results for input(s): LIPASE, AMYLASE in the last 168 hours. No results for input(s): AMMONIA in the last 168 hours. Coagulation Profile: Recent Labs  Lab 06/26/19 0514  INR 1.2   Cardiac Enzymes: No results for input(s): CKTOTAL, CKMB, CKMBINDEX, TROPONINI in the last 168 hours. BNP (last 3 results) No results for input(s): PROBNP in the last 8760 hours. HbA1C: No results for input(s): HGBA1C in the last 72 hours. CBG: Recent Labs  Lab 06/26/19 2132 06/27/19 0039 06/27/19 0425 06/27/19 0819  GLUCAP 116* 109* 100* 100*   Lipid Profile: No results for input(s): CHOL, HDL, LDLCALC,  TRIG, CHOLHDL, LDLDIRECT in the last 72 hours. Thyroid Function Tests: No results for input(s): TSH, T4TOTAL, FREET4, T3FREE, THYROIDAB in the last 72 hours. Anemia Panel: No results for input(s): VITAMINB12, FOLATE, FERRITIN, TIBC, IRON, RETICCTPCT in the last 72 hours. Sepsis Labs: Recent Labs  Lab 06/25/19 1434 06/25/19 1642  LATICACIDVEN 1.7 1.6    Recent Results (from the past 240 hour(s))  SARS Coronavirus 2 [facility] order, Performed in [facility] hospital lab) Nasopharyngeal Nasopharyngeal Swab     Status: None   Collection Time: 06/25/19  2:57 PM   Specimen: Nasopharyngeal Swab  Result Value Ref Range Status   SARS Coronavirus 2 NEGATIVE NEGATIVE Final    Comment: (NOTE) If result is NEGATIVE SARS-CoV-2 target nucleic acids are NOT DETECTED. The SARS-CoV-2 RNA is generally detectable in upper and lower  respiratory specimens during the acute phase of infection. The lowest  concentration of SARS-CoV-2 viral copies this assay can detect is 250  copies / mL. A negative result does not preclude SARS-CoV-2 infection  and should not be used as the sole basis for treatment or other  patient management decisions.  A negative result may occur with  improper specimen collection / handling, submission of specimen other  than nasopharyngeal swab, presence of viral mutation(s) within the  areas targeted by this assay, and inadequate number of viral copies  (<250 copies / mL). A negative result must be combined with clinical  observations, patient history, and epidemiological information. If result is POSITIVE SARS-CoV-2 target nucleic acids are DETECTED. The SARS-CoV-2 RNA is generally detectable in upper and lower  respiratory specimens dur ing the acute phase of infection.  Positive  results are indicative of active infection with SARS-CoV-2.  Clinical  correlation with patient history and other diagnostic information is  necessary to determine patient infection status.   Positive results do  not rule out bacterial infection or co-infection with other viruses. If result is PRESUMPTIVE POSTIVE SARS-CoV-2 nucleic acids MAY BE PRESENT.   A presumptive positive result was obtained on the submitted specimen  and confirmed on repeat testing.  While 2019 novel coronavirus  (SARS-CoV-2) nucleic acids may be present in the submitted sample  additional confirmatory testing may be necessary for epidemiological  and / or clinical management purposes  to differentiate between  SARS-CoV-2 and other Sarbecovirus currently known to infect humans.  If clinically indicated additional testing with an alternate test  methodology [phone]) is advised. The SARS-CoV-2 RNA is generally  detectable in upper and lower respiratory sp ecimens during the acute  phase of infection. The expected result is Negative. Fact Sheet for Patients:  StrictlyIdeas.no Fact Sheet for Healthcare Providers: BankingDealers.co.za This test is not yet approved or cleared by the [place] FDA and has been authorized for detection and/or diagnosis of SARS-CoV-2 by FDA under an Emergency Use Authorization (EUA).  This EUA will remain in effect (meaning this test can be used) for the duration of the COVID-19 declaration under Section 564(b)(1) of the Act, 21 U.S.C. section 360bbb-3(b)(1), unless the authorization is terminated or revoked sooner. Performed at [facility], [place] [address] [place], [place] [address]          Radiology Studies: No results found.      Scheduled Meds: . aspirin EC  81 mg Oral Daily  . Chlorhexidine Gluconate Cloth  6 each Topical Daily  . sodium chloride flush  3 mL Intravenous Q12H  . sodium chloride flush  3 mL Intravenous Q12H   Continuous Infusions: . sodium chloride 75 mL/hr at 06/28/19 0302  . sodium chloride    . aztreonam 1 g (06/28/19 0634)  . heparin Stopped (06/28/19 0222)  .  vancomycin 750 mg (06/27/19 1052)     LOS: 3 days     Georgette Shell, MD [facility]  [email or web address] Password [email or web address] 06/28/2019, 11:20 AM

## 2019-06-29 LAB — CBC
HCT: 21.9 % — ABNORMAL LOW (ref 36.0–46.0)
Hemoglobin: 7.9 g/dL — ABNORMAL LOW (ref 12.0–15.0)
MCH: 31.2 pg (ref 26.0–34.0)
MCHC: 36.1 g/dL — ABNORMAL HIGH (ref 30.0–36.0)
MCV: 86.6 fL (ref 80.0–100.0)
Platelets: 161 10*3/uL (ref 150–400)
RBC: 2.53 MIL/uL — ABNORMAL LOW (ref 3.87–5.11)
RDW: 14.4 % (ref 11.5–15.5)
WBC: 17.1 10*3/uL — ABNORMAL HIGH (ref 4.0–10.5)
nRBC: 0 % (ref 0.0–0.2)

## 2019-06-29 LAB — CREATININE, SERUM
Creatinine, Ser: 0.99 mg/dL (ref 0.44–1.00)
GFR calc Af Amer: >60 mL/min (ref >60)
GFR calc non Af Amer: 59 mL/min — ABNORMAL LOW (ref >60)

## 2019-06-29 LAB — PREPARE RBC (CROSSMATCH)

## 2019-06-29 LAB — MAGNESIUM: Magnesium: 1.9 mg/dL (ref 1.7–2.4)

## 2019-06-29 MED ORDER — SODIUM CHLORIDE 0.9% IV SOLUTION
Freq: Once | INTRAVENOUS | Status: AC
  Administered 2019-06-29: 13:00:00 via INTRAVENOUS

## 2019-06-29 MED ORDER — SACCHAROMYCES BOULARDII 250 MG PO CAPS
250.0000 mg | ORAL_CAPSULE | Freq: Two times a day (BID) | ORAL | Status: DC
  Administered 2019-06-29 – 2019-07-08 (×18): 250 mg via ORAL
  Filled 2019-06-29 (×19): qty 1

## 2019-06-29 MED ORDER — OXYCODONE HCL 5 MG PO TABS
5.0000 mg | ORAL_TABLET | ORAL | Status: DC | PRN
  Administered 2019-06-29 – 2019-07-08 (×41): 5 mg via ORAL
  Filled 2019-06-29 (×43): qty 1

## 2019-06-29 MED ORDER — CARVEDILOL 3.125 MG PO TABS
3.1250 mg | ORAL_TABLET | Freq: Two times a day (BID) | ORAL | Status: DC
  Administered 2019-06-29 – 2019-07-08 (×18): 3.125 mg via ORAL
  Filled 2019-06-29 (×19): qty 1

## 2019-06-29 NOTE — Progress Notes (Signed)
PROGRESS NOTE    Wendy Townsend  [id number] DOB: 11-Jul-1951 DOA: 06/25/2019 PCP: Sandrea Hughs, NP   Brief Narrative:68 year old woman PMH peripheral vascular disease, CKD stage III, stroke PRESENTED to [place] long the emergency department with 2 days of severe foot pain. Ultrasound showed occlusion of left femoral popliteal bypass. EDP discussed with vascular surgery with recommendation for heparin and transferred to [facility]. Not clear that surgery will be required.  Patient reports she has had scaling skin since her surgery. She has had intermittent waxing and waning pain in her foot. However approximately 2 days ago she developed intense pain in the left foot, severe, unable to ambulate on it. Improved with pain medication in the emergency department. No specific aggravating or alleviating factors. No specific associated symptoms noted.  Chart review:  1/15/2020presented to the hospital with profound ischemia of her left foot with occlusion of her left limb of her aortofemoral graft and her left femoropopliteal bypass. She was taken emergently to the operating room where she underwent attempted thrombectomy of the left limb. This was not possible. She underwent right to left femorofemoral bypass and thrombectomy of her left femoral to popliteal bypass both through the groin and through the below-knee popliteal level. She did well in the hospital and was discharged home  ED Course:Notes: Presented from home, hard time walking secondary to left leg pain. EDP noted patient presented with questionable wound infection left leg/foot, dealing with dry skin. 1 2 weeks things have been getting worse. Per EMS fever 101.2. EDP was unable to palpate DP, PT pulse. Unable to obtain dopplerable pulses DP, PT, popliteal. Femoral pulses noted.  06/26/2019 patient resting in bed complaining of severe left lower extremity pain  10/8 s/p thrombolysis 10/7 some improvement but still  with  lots of pain 06/28/2019 patient resting in bed does complain of pain in the left lower extremity.  Overnight events noted with arrhythmias .Marland Kitchen Now in sinus tach.. Hemoglobin was 5.82 units of blood transfusion ordered hypokalemia noted IV potassium ordered, magnesium severely low at 0.9 IV magnesium ordered  10/10 better but still with lot of pain left leg  Assessment & Plan:   Principal Problem:   Left foot pain Active Problems:   Ischemic pain of foot, left   AKI (acute kidney injury) ([place])   Dehydration   CKD (chronic kidney disease), stage III   Anemia in chronic kidney disease (CKD)    #1 left lower extremity ischemia status post left below-knee popliteal artery angioplasty and thrombolysis/gangrene-patient with foul-smelling left lower extremity associated with left lower extremity pain.  Vascular planning for left above-knee amputation on Monday.  Patient was on IV heparin which was stopped due to acute drop in hemoglobin with arrhythmias.  Vascular wants to start long-term anticoagulation once all the surgical procedures are over to preserve the remaining grafts.  I will continue vancomycin and as a Bactrim for now.   #2 AKI on CKD stage IIIresolved with IV fluids creatinine down to 0.99.  ACE inhibitor on hold.  HCTZ on hold.  #3 hypertension patient was hypotensive on admission which is resolved with IV hydration.  Now her blood pressure is starting to trend up.  I will restart Coreg She also takes HCTZ Catapres and Zestril at home.   #4 history of stroke with mild left-sided weakness on aspirin at home.Aspirin has not been restarted.  #5 history of tobacco use and substance abuse counseled.  #6  Acute blood loss anemia hemoglobin dropped to 5.5 on 06/28/2019 she  received 2 units of blood transfusion her hemoglobin went up to 9.7 after transfusion and is down to 7.9 today 06/29/2019.  No overt bleeding noted we will continue to monitor daily.  #7 hyponatremia  resolved  with normal saline patient was dry and dehydrated at the time of admission in addition to being on hydrochlorothiazide and ACE.  #8 hypokalemiareplete  #9 hypomagnesemia with a magnesium of 0.9 replete and recheck    #10 worsening leukocytosis patient has been on Vanco and aztreonam since admission due to significant leukocytosis and soft blood pressure with elevated CRP on admission along with fever.  WBCs continues to worsen probably stress related.  However will check a UA and continue antibiotics for now.  Estimated body mass index is 25.23 kg/m as calculated from the following:   Height as of this encounter: 5\' 4"  (1.626 m).   Weight as of this encounter: 66.7 kg. DVT prophylaxis:scd patient was on IV heparin this was stopped due to acute drop in hemoglobin code Status:Full code  family Communication:No family available Disposition Plan:Pending clinical improvement Consultants:  Vascular surgery  Procedures:Thrombolysis 06/26/2019 Left below-knee popliteal artery angioplasty 06/27/2019 Antimicrobials:vanco and aztreonam  Subjective: Patient resting in bed reports the pain is better but still has a lot of pain in the left lower extremity slept well denies any other new complaints no chest pain shortness of breath nausea vomiting reported had loose bowel movements overnight  Objective: Vitals:   06/28/19 1800 06/28/19 2100 06/29/19 0447 06/29/19 0800  BP: 134/67 (!) 116/52 119/60 138/73  Pulse: 61 90 92 97  Resp: 18 17 17 15   Temp: 98.2 F (36.8 C) 98.3 F (36.8 C) 98 F (36.7 C) 97.6 F (36.4 C)  TempSrc: Oral Oral Oral Oral  SpO2: 100% 98% 100% 100%  Weight:      Height:        Intake/Output Summary (Last 24 hours) at 06/29/2019 0915 Last data filed at 06/29/2019 0800 Gross per 24 hour  Intake 450 ml  Output 800 ml  Net -350 ml   Filed Weights   06/25/19 1826  Weight: 66.7 kg    Examination:  General exam: Appears calm and comfortable   Respiratory system: Clear to auscultation. Respiratory effort normal. Cardiovascular system: S1 & S2 heard, RRR. No JVD, murmurs, rubs, gallops or clicks. No pedal edema. Gastrointestinal system: Abdomen is nondistended, soft and nontender. No organomegaly or masses felt. Normal bowel sounds heard. Central nervous system: Alert and oriented. No focal neurological deficits. Extremities: Left lower extremity dressings on foul smelling  Skin: No rashes, lesions or ulcers Psychiatry: Judgement and insight appear normal. Mood & affect appropriate.     Data Reviewed: I have personally reviewed following labs and imaging studies  CBC: Recent Labs  Lab 06/25/19 1434  06/27/19 0441 06/27/19 1025 06/28/19 0108 06/28/19 0347 06/28/19 1600 06/29/19 0251  WBC 17.5*   < > 18.3* 18.1* 24.5* 22.7*  --  17.1*  NEUTROABS 14.1*  --   --   --  20.9*  --   --   --   HGB 9.6*   < > 8.7* 7.6* 5.8* 5.5* 9.7* 7.9*  HCT 29.2*   < > 25.6* 21.8* 16.9* 16.3* 26.9* 21.9*  MCV 93.0   < > 92.4 90.5 91.8 93.1  --  86.6  PLT 377   < > 324 270 218 230  --  161   < > = values in this interval not displayed.   Basic Metabolic Panel: Recent Labs  Lab 06/25/19 1434 06/26/19 0514 06/27/19 0441 06/27/19 0544 06/27/19 1025 06/28/19 0108 06/28/19 0347 06/28/19 1600 06/29/19 0251  NA 129* 134*  --  140  --  137  --   --   --   K 2.5* 3.3*  --  3.3*  --  3.4*  --   --   --   CL 91* 101  --  109  --  109  --   --   --   CO2 24 20*  --  19*  --  18*  --   --   --   GLUCOSE 116* 123*  --  112*  --  145*  --   --   --   BUN 54* 41*  --  24*  --  25*  --   --   --   CREATININE 3.77* 2.03* 1.29* 1.24*  --  1.32* 1.32*  --  0.99  CALCIUM 8.5* 8.1*  --  7.9*  --  7.2*  --   --   --   MG  --   --   --   --  1.0* 0.9*  --  2.5* 1.9   GFR: Estimated Creatinine Clearance: 51.8 mL/min (by C-G formula based on SCr of 0.99 mg/dL). Liver Function Tests: Recent Labs  Lab 06/25/19 1434 06/28/19 0108  AST 27 16  ALT  12 10  ALKPHOS 97 69  BILITOT 1.5* 0.9  PROT 7.9 5.2*  ALBUMIN 3.5 1.9*   No results for input(s): LIPASE, AMYLASE in the last 168 hours. No results for input(s): AMMONIA in the last 168 hours. Coagulation Profile: Recent Labs  Lab 06/26/19 0514  INR 1.2   Cardiac Enzymes: No results for input(s): CKTOTAL, CKMB, CKMBINDEX, TROPONINI in the last 168 hours. BNP (last 3 results) No results for input(s): PROBNP in the last 8760 hours. HbA1C: No results for input(s): HGBA1C in the last 72 hours. CBG: Recent Labs  Lab 06/26/19 2132 06/27/19 0039 06/27/19 0425 06/27/19 0819  GLUCAP 116* 109* 100* 100*   Lipid Profile: No results for input(s): CHOL, HDL, LDLCALC, TRIG, CHOLHDL, LDLDIRECT in the last 72 hours. Thyroid Function Tests: No results for input(s): TSH, T4TOTAL, FREET4, T3FREE, THYROIDAB in the last 72 hours. Anemia Panel: No results for input(s): VITAMINB12, FOLATE, FERRITIN, TIBC, IRON, RETICCTPCT in the last 72 hours. Sepsis Labs: Recent Labs  Lab 06/25/19 1434 06/25/19 1642  LATICACIDVEN 1.7 1.6    Recent Results (from the past 240 hour(s))  SARS Coronavirus 2 [facility] order, Performed in [facility] hospital lab) Nasopharyngeal Nasopharyngeal Swab     Status: None   Collection Time: 06/25/19  2:57 PM   Specimen: Nasopharyngeal Swab  Result Value Ref Range Status   SARS Coronavirus 2 NEGATIVE NEGATIVE Final    Comment: (NOTE) If result is NEGATIVE SARS-CoV-2 target nucleic acids are NOT DETECTED. The SARS-CoV-2 RNA is generally detectable in upper and lower  respiratory specimens during the acute phase of infection. The lowest  concentration of SARS-CoV-2 viral copies this assay can detect is 250  copies / mL. A negative result does not preclude SARS-CoV-2 infection  and should not be used as the sole basis for treatment or other  patient management decisions.  A negative result may occur with  improper specimen collection / handling, submission of  specimen other  than nasopharyngeal swab, presence of viral mutation(s) within the  areas targeted by this assay, and inadequate number of viral copies  (<250 copies / mL). A  negative result must be combined with clinical  observations, patient history, and epidemiological information. If result is POSITIVE SARS-CoV-2 target nucleic acids are DETECTED. The SARS-CoV-2 RNA is generally detectable in upper and lower  respiratory specimens dur ing the acute phase of infection.  Positive  results are indicative of active infection with SARS-CoV-2.  Clinical  correlation with patient history and other diagnostic information is  necessary to determine patient infection status.  Positive results do  not rule out bacterial infection or co-infection with other viruses. If result is PRESUMPTIVE POSTIVE SARS-CoV-2 nucleic acids MAY BE PRESENT.   A presumptive positive result was obtained on the submitted specimen  and confirmed on repeat testing.  While 2019 novel coronavirus  (SARS-CoV-2) nucleic acids may be present in the submitted sample  additional confirmatory testing may be necessary for epidemiological  and / or clinical management purposes  to differentiate between  SARS-CoV-2 and other Sarbecovirus currently known to infect humans.  If clinically indicated additional testing with an alternate test  methodology [phone]) is advised. The SARS-CoV-2 RNA is generally  detectable in upper and lower respiratory sp ecimens during the acute  phase of infection. The expected result is Negative. Fact Sheet for Patients:  StrictlyIdeas.no Fact Sheet for Healthcare Providers: BankingDealers.co.za This test is not yet approved or cleared by the [place] FDA and has been authorized for detection and/or diagnosis of SARS-CoV-2 by FDA under an Emergency Use Authorization (EUA).  This EUA will remain in effect (meaning this test can be used) for the  duration of the COVID-19 declaration under Section 564(b)(1) of the Act, 21 U.S.C. section 360bbb-3(b)(1), unless the authorization is terminated or revoked sooner. Performed at [facility], [place] [address], [place], [place] [address]   MRSA PCR Screening     Status: None   Collection Time: 06/28/19  4:29 PM   Specimen: Nasal Mucosa; Nasopharyngeal  Result Value Ref Range Status   MRSA by PCR NEGATIVE NEGATIVE Final    Comment:        The GeneXpert MRSA Assay (FDA approved for NASAL specimens only), is one component of a comprehensive MRSA colonization surveillance program. It is not intended to diagnose MRSA infection nor to guide or monitor treatment for MRSA infections. Performed at [facility], [place] [address], [place], [place] [address]          Radiology Studies: No results found.      Scheduled Meds: . sodium chloride   Intravenous Once  . aspirin EC  81 mg Oral Daily  . Chlorhexidine Gluconate Cloth  6 each Topical Daily  . sodium chloride flush  3 mL Intravenous Q12H  . sodium chloride flush  3 mL Intravenous Q12H   Continuous Infusions: . sodium chloride 75 mL/hr at 06/28/19 0302  . sodium chloride    . aztreonam 1 g (06/29/19 0625)  . heparin Stopped (06/28/19 0222)  . vancomycin 750 mg (06/28/19 1404)     LOS: 4 days     Georgette Shell, MD [facility]  If 7PM-7AM, please contact night-coverage [email or web address] Password [email or web address] 06/29/2019, 9:15 AM

## 2019-06-29 NOTE — Progress Notes (Signed)
[facility]   Subjective  - left leg some pain   Objective 119/60 92 98 F (36.7 C) (Oral) 17 100%  Intake/Output Summary (Last 24 hours) at 06/29/2019 0814 Last data filed at 06/28/2019 1525 Gross per 24 hour  Intake 765 ml  Output 400 ml  Net 365 ml   Necrotic left calf and foot Flexion contracture of left knee  Assessment/Planning: Gangrene left leg with necrotic muscle knee contracture  Pt now consenting to left AkA on Monday  Ruta Hinds 06/29/2019 8:14 AM --  Laboratory Lab Results: Recent Labs    06/28/19 0347 06/28/19 1600 06/29/19 0251  WBC 22.7*  --  17.1*  HGB 5.5* 9.7* 7.9*  HCT 16.3* 26.9* 21.9*  PLT 230  --  161   BMET Recent Labs    06/27/19 0544 06/28/19 0108 06/28/19 0347 06/29/19 0251  NA 140 137  --   --   K 3.3* 3.4*  --   --   CL 109 109  --   --   CO2 19* 18*  --   --   GLUCOSE 112* 145*  --   --   BUN 24* 25*  --   --   CREATININE 1.24* 1.32* 1.32* 0.99  CALCIUM 7.9* 7.2*  --   --     COAG Lab Results  Component Value Date   INR 1.2 06/26/2019   INR 1.43 07/14/2016   INR 1.15 07/07/2016   No results found for: PTT

## 2019-06-30 DIAGNOSIS — M79672 Pain in left foot: Secondary | ICD-10-CM | POA: Diagnosis not present

## 2019-06-30 LAB — URINALYSIS, ROUTINE W REFLEX MICROSCOPIC
Bilirubin Urine: NEGATIVE
Glucose, UA: NEGATIVE mg/dL
Hgb urine dipstick: NEGATIVE
Ketones, ur: NEGATIVE mg/dL
Leukocytes,Ua: NEGATIVE
Nitrite: NEGATIVE
Protein, ur: NEGATIVE mg/dL
Specific Gravity, Urine: 1.015 (ref 1.005–1.030)
pH: 5 (ref 5.0–8.0)

## 2019-06-30 LAB — COMPREHENSIVE METABOLIC PANEL
ALT: 12 U/L (ref 0–44)
AST: 29 U/L (ref 15–41)
Albumin: 1.9 g/dL — ABNORMAL LOW (ref 3.5–5.0)
Alkaline Phosphatase: 70 U/L (ref 38–126)
Anion gap: 10 (ref 5–15)
BUN: 14 mg/dL (ref 8–23)
CO2: 18 mmol/L — ABNORMAL LOW (ref 22–32)
Calcium: 7.4 mg/dL — ABNORMAL LOW (ref 8.9–10.3)
Chloride: 110 mmol/L (ref 98–111)
Creatinine, Ser: 1.05 mg/dL — ABNORMAL HIGH (ref 0.44–1.00)
GFR calc Af Amer: >60 mL/min (ref >60)
GFR calc non Af Amer: 55 mL/min — ABNORMAL LOW (ref >60)
Glucose, Bld: 99 mg/dL (ref 70–99)
Potassium: 3.4 mmol/L — ABNORMAL LOW (ref 3.5–5.1)
Sodium: 138 mmol/L (ref 135–145)
Total Bilirubin: 1 mg/dL (ref 0.3–1.2)
Total Protein: 5.3 g/dL — ABNORMAL LOW (ref 6.5–8.1)

## 2019-06-30 LAB — TYPE AND SCREEN
ABO/RH(D): B POS
Antibody Screen: NEGATIVE
Unit division: 0
Unit division: 0
Unit division: 0
Unit division: 0

## 2019-06-30 LAB — BPAM RBC
Blood Product Expiration Date: 202010292359
Blood Product Expiration Date: 202010292359
Blood Product Expiration Date: 202010302359
Blood Product Expiration Date: 202010302359
ISSUE DATE / TIME: 202010090546
ISSUE DATE / TIME: 202010091028
ISSUE DATE / TIME: 202010101221
ISSUE DATE / TIME: 202010101807
Unit Type and Rh: 7300
Unit Type and Rh: 7300
Unit Type and Rh: 7300
Unit Type and Rh: 7300

## 2019-06-30 LAB — CBC
HCT: 31.9 % — ABNORMAL LOW (ref 36.0–46.0)
Hemoglobin: 11.2 g/dL — ABNORMAL LOW (ref 12.0–15.0)
MCH: 31.1 pg (ref 26.0–34.0)
MCHC: 35.1 g/dL (ref 30.0–36.0)
MCV: 88.6 fL (ref 80.0–100.0)
Platelets: 169 10*3/uL (ref 150–400)
RBC: 3.6 MIL/uL — ABNORMAL LOW (ref 3.87–5.11)
RDW: 14.7 % (ref 11.5–15.5)
WBC: 13.4 10*3/uL — ABNORMAL HIGH (ref 4.0–10.5)
nRBC: 0 % (ref 0.0–0.2)

## 2019-06-30 LAB — VANCOMYCIN, TROUGH: Vancomycin Tr: 13 ug/mL — ABNORMAL LOW (ref 15–20)

## 2019-06-30 LAB — VANCOMYCIN, PEAK: Vancomycin Pk: 35 ug/mL (ref 30–40)

## 2019-06-30 MED ORDER — POTASSIUM CHLORIDE CRYS ER 20 MEQ PO TBCR
40.0000 meq | EXTENDED_RELEASE_TABLET | Freq: Once | ORAL | Status: AC
  Administered 2019-06-30: 40 meq via ORAL
  Filled 2019-06-30: qty 2

## 2019-06-30 NOTE — Progress Notes (Signed)
Pharmacy Antibiotic Note  Wendy Townsend is a 68 y.o. female admitted on 06/25/2019 with wound infection.  Pharmacy has been consulted for vancomycin + aztreonam dosing. -WBC= 13.4 down, afeb, SCr= 1.05 (down from 3.77; baseline ~ 1.1-1.5).  Left AKA planned for tomorrow, 10/12.   Vancomycin levels (trough: 13, peak: 35) give steady state AUC within therapeutic range at 530.7.   Plan: -Continue Aztreonam 1 g IV q8h -Continue vancomycin to 750mg  IV q24h (steady state AUC 530.7) -Will follow renal function, cultures and clinical progress   Height: 5\' 4"  (162.6 cm) Weight: 147 lb (66.7 kg) IBW/kg (Calculated) : 54.7  Temp (24hrs), Avg:98.1 F (36.7 C), Min:97.8 F (36.6 C), Max:98.4 F (36.9 C)  Recent Labs  Lab 06/25/19 1434 06/25/19 1642  06/27/19 0544 06/27/19 1025 06/28/19 0108 06/28/19 0347 06/29/19 0251 06/30/19 0510 06/30/19 1001 06/30/19 1218  WBC 17.5*  --    < >  --  18.1* 24.5* 22.7* 17.1* 13.4*  --   --   CREATININE 3.77*  --    < > 1.24*  --  1.32* 1.32* 0.99 1.05*  --   --   LATICACIDVEN 1.7 1.6  --   --   --   --   --   --   --   --   --   VANCOTROUGH  --   --   --   --   --   --   --   --   --  13*  --   VANCOPEAK  --   --   --   --   --   --   --   --   --   --  35   < > = values in this interval not displayed.    Estimated Creatinine Clearance: 48.8 mL/min (A) (by C-G formula based on SCr of 1.05 mg/dL (H)).    Allergies  Allergen Reactions  . Penicillins Swelling    Has patient had a PCN reaction causing immediate rash, facial/tongue/throat swelling, SOB or lightheadedness with hypotension: YES Has patient had a PCN reaction causing severe rash involving mucus membranes or skin necrosis: NO Has patient had a PCN reaction that required hospitalization NO Has patient had a PCN reaction occurring within the last 10 years: NO If all of the above answers are "NO", then may proceed with Cephalosporin use.    Antimicrobials this admission: vancomycin  10/6 >>  aztreonam 10/6 >>   Microbiology results: 10/6 SARS-2: Negative 10/9 MRSA PCR: neg 10/11 UCx: sent  Richardine Service, PharmD PGY1 Pharmacy Resident Phone: [phone] 06/30/2019  3:01 PM  Please check [email or web address] for unit-specific pharmacy phone numbers.

## 2019-06-30 NOTE — Progress Notes (Signed)
Pt for AkA tomorrow  NPO p midnight Consent Questions answered  Ruta Hinds, MD [facility] [place] Office: [phone] Pager: [phone]

## 2019-06-30 NOTE — H&P (View-Only) (Signed)
Pt for AkA tomorrow  NPO p midnight Consent Questions answered  Ruta Hinds, MD [facility] [place] Office: [phone] Pager: [phone]

## 2019-06-30 NOTE — Progress Notes (Signed)
PROGRESS NOTE    Wendy Townsend  [id number] DOB: 1951/03/03 DOA: 06/25/2019 PCP: Sandrea Hughs, NP    Brief Narrative: 68 year old woman PMH peripheral vascular disease, CKD stage III, stroke PRESENTED to [place] long the emergency department with 2 days of severe foot pain. Ultrasound showed occlusion of left femoral popliteal bypass. EDP discussed with vascular surgery with recommendation for heparin and transferred to [facility]. Not clear that surgery will be required.  Patient reports she has had scaling skin since her surgery. She has had intermittent waxing and waning pain in her foot. However approximately 2 days ago she developed intense pain in the left foot, severe, unable to ambulate on it. Improved with pain medication in the emergency department. No specific aggravating or alleviating factors. No specific associated symptoms noted.  Chart review:  1/15/2020presented to the hospital with profound ischemia of her left foot with occlusion of her left limb of her aortofemoral graft and her left femoropopliteal bypass. She was taken emergently to the operating room where she underwent attempted thrombectomy of the left limb. This was not possible. She underwent right to left femorofemoral bypass and thrombectomy of her left femoral to popliteal bypass both through the groin and through the below-knee popliteal level. She did well in the hospital and was discharged home  ED Course:Notes: Presented from home, hard time walking secondary to left leg pain. EDP noted patient presented with questionable wound infection left leg/foot, dealing with dry skin. 1 2 weeks things have been getting worse. Per EMS fever 101.2. EDP was unable to palpate DP, PT pulse. Unable to obtain dopplerable pulses DP, PT, popliteal. Femoral pulses noted.  06/26/2019 patient resting in bed complaining of severe left lower extremity pain  10/8 s/p thrombolysis 10/7 some improvement but still  with lots of pain 06/28/2019 patient resting in bed does complain of pain in the left lower extremity. Overnight events noted with arrhythmias .Marland KitchenNow in sinus tach..Hemoglobin was 5.82 units of blood  transfusion ordered hypokalemia noted IV potassium ordered, magnesium severely low at 0.9 IV magnesium ordered  10/10 better but still with lot of pain left leg  06/30/2019 patient resting in bed consented to have left AKA tomorrow, had bowel movements no diarrhea no nausea vomiting.  Pain better with oxycodone  Assessment & Plan:   Principal Problem:   Left foot pain Active Problems:   Ischemic pain of foot, left   AKI (acute kidney injury) ([place])   Dehydration   CKD (chronic kidney disease), stage III   Anemia in chronic kidney disease (CKD)    #1 left lower extremity ischemia status post left below-knee popliteal artery angioplasty and thrombolysis/gangrene-patient with foul-smelling left lower extremity associated with left lower extremity pain.  Patient to have left above-knee amputation on Monday.  Patient was on IV heparin this was stopped due to acute drop in hemoglobin.  Patient will need long-term anticoagulation once all the surgical procedures are over to preserve the remaining grafts.   Continue vancomycin and aztreonam.   #2 AKI on CKD stage IIIresolved with IV fluids creatinine down to 0.99.  ACE inhibitor on hold.  HCTZ on hold.  #3 hypertension patient was hypotensive on admission which is resolved with IV hydration.  With the BP trending up coreg restarted yesterday.  Restart Catapres.  Continue to hold HCTZ  And zestril.  #4 history of stroke with mild left-sided weakness on aspirin at home.Aspirin has not been restarted.  #5 history of tobacco use and substance abuse counseled.  #6Acute blood  loss anemia hemoglobin dropped to 5.5 on 06/28/2019 she received 2 units of blood transfusion her hemoglobin is up to 11.2.    #7 hyponatremiaresolvedwith  normal saline patient was dry and dehydrated at the time of admission in addition to being on hydrochlorothiazide and ACE.  #8 hypokalemiareplete #9 hypomagnesemia  related to follow-up#10 leukocytosis resolving due to #1   Estimated body mass index is 25.23 kg/m as calculated from the following:   Height as of this encounter: 5\' 4"  (1.626 m).   Weight as of this encounter: 66.7 kg.  DVT prophylaxis:scdpatient was on IV heparin this was stopped due to acute drop in hemoglobin code Status:Full code  family Communication:No family available Disposition Plan:Pending clinical improvement Consultants:  Vascular surgery  Procedures:Thrombolysis 06/26/2019 Left below-knee popliteal artery angioplasty 06/27/2019 Antimicrobials:vanco and aztreonam  subjective  Objective: Vitals:   06/29/19 2100 06/30/19 0457 06/30/19 0845 06/30/19 0943  BP: 138/88 (!) 151/84 132/76 (!) 154/90  Pulse: 78 69 89   Resp: 15 18 17    Temp: 98.4 F (36.9 C) 97.8 F (36.6 C) 98.1 F (36.7 C) 97.8 F (36.6 C)  TempSrc: Oral Oral Oral Oral  SpO2: 100% 100% 100%   Weight:      Height:        Intake/Output Summary (Last 24 hours) at 06/30/2019 1255 Last data filed at 06/29/2019 2100 Gross per 24 hour  Intake 681 ml  Output -  Net 681 ml   Filed Weights   06/25/19 1826  Weight: 66.7 kg    Examination:  General exam: Appears calm and comfortable  Respiratory system: Clear to auscultation. Respiratory effort normal. Cardiovascular system: S1 & S2 heard, RRR. No JVD, murmurs, rubs, gallops or clicks. No pedal edema. Gastrointestinal system: Abdomen is nondistended, soft and nontender. No organomegaly or masses felt. Normal bowel sounds heard. Central nervous system: Alert and oriented. No focal neurological deficits. Extremities left lower extremity covered with dressings saturated and foul-smelling drainage Skin: No rashes, lesions or ulcers Psychiatry: Judgement and insight appear  normal. Mood & affect appropriate.     Data Reviewed: I have personally reviewed following labs and imaging studies  CBC: Recent Labs  Lab 06/25/19 1434  06/27/19 1025 06/28/19 0108 06/28/19 0347 06/28/19 1600 06/29/19 0251 06/30/19 0510  WBC 17.5*   < > 18.1* 24.5* 22.7*  --  17.1* 13.4*  NEUTROABS 14.1*  --   --  20.9*  --   --   --   --   HGB 9.6*   < > 7.6* 5.8* 5.5* 9.7* 7.9* 11.2*  HCT 29.2*   < > 21.8* 16.9* 16.3* 26.9* 21.9* 31.9*  MCV 93.0   < > 90.5 91.8 93.1  --  86.6 88.6  PLT 377   < > 270 218 230  --  161 169   < > = values in this interval not displayed.   Basic Metabolic Panel: Recent Labs  Lab 06/25/19 1434 06/26/19 0514  06/27/19 0544 06/27/19 1025 06/28/19 0108 06/28/19 0347 06/28/19 1600 06/29/19 0251 06/30/19 0510  NA 129* 134*  --  140  --  137  --   --   --  138  K 2.5* 3.3*  --  3.3*  --  3.4*  --   --   --  3.4*  CL 91* 101  --  109  --  109  --   --   --  110  CO2 24 20*  --  19*  --  18*  --   --   --  18*  GLUCOSE 116* 123*  --  112*  --  145*  --   --   --  99  BUN 54* 41*  --  24*  --  25*  --   --   --  14  CREATININE 3.77* 2.03*   < > 1.24*  --  1.32* 1.32*  --  0.99 1.05*  CALCIUM 8.5* 8.1*  --  7.9*  --  7.2*  --   --   --  7.4*  MG  --   --   --   --  1.0* 0.9*  --  2.5* 1.9  --    < > = values in this interval not displayed.   GFR: Estimated Creatinine Clearance: 48.8 mL/min (A) (by C-G formula based on SCr of 1.05 mg/dL (H)). Liver Function Tests: Recent Labs  Lab 06/25/19 1434 06/28/19 0108 06/30/19 0510  AST 27 16 29   ALT 12 10 12   ALKPHOS 97 69 70  BILITOT 1.5* 0.9 1.0  PROT 7.9 5.2* 5.3*  ALBUMIN 3.5 1.9* 1.9*   No results for input(s): LIPASE, AMYLASE in the last 168 hours. No results for input(s): AMMONIA in the last 168 hours. Coagulation Profile: Recent Labs  Lab 06/26/19 0514  INR 1.2   Cardiac Enzymes: No results for input(s): CKTOTAL, CKMB, CKMBINDEX, TROPONINI in the last 168 hours. BNP (last 3  results) No results for input(s): PROBNP in the last 8760 hours. HbA1C: No results for input(s): HGBA1C in the last 72 hours. CBG: Recent Labs  Lab 06/26/19 2132 06/27/19 0039 06/27/19 0425 06/27/19 0819  GLUCAP 116* 109* 100* 100*   Lipid Profile: No results for input(s): CHOL, HDL, LDLCALC, TRIG, CHOLHDL, LDLDIRECT in the last 72 hours. Thyroid Function Tests: No results for input(s): TSH, T4TOTAL, FREET4, T3FREE, THYROIDAB in the last 72 hours. Anemia Panel: No results for input(s): VITAMINB12, FOLATE, FERRITIN, TIBC, IRON, RETICCTPCT in the last 72 hours. Sepsis Labs: Recent Labs  Lab 06/25/19 1434 06/25/19 1642  LATICACIDVEN 1.7 1.6    Recent Results (from the past 240 hour(s))  SARS Coronavirus 2 [place] order, Performed in [facility] hospital lab) Nasopharyngeal Nasopharyngeal Swab     Status: None   Collection Time: 06/25/19  2:57 PM   Specimen: Nasopharyngeal Swab  Result Value Ref Range Status   SARS Coronavirus 2 NEGATIVE NEGATIVE Final    Comment: (NOTE) If result is NEGATIVE SARS-CoV-2 target nucleic acids are NOT DETECTED. The SARS-CoV-2 RNA is generally detectable in upper and lower  respiratory specimens during the acute phase of infection. The lowest  concentration of SARS-CoV-2 viral copies this assay can detect is 250  copies / mL. A negative result does not preclude SARS-CoV-2 infection  and should not be used as the sole basis for treatment or other  patient management decisions.  A negative result may occur with  improper specimen collection / handling, submission of specimen other  than nasopharyngeal swab, presence of viral mutation(s) within the  areas targeted by this assay, and inadequate number of viral copies  (<250 copies / mL). A negative result must be combined with clinical  observations, patient history, and epidemiological information. If result is POSITIVE SARS-CoV-2 target nucleic acids are DETECTED. The SARS-CoV-2 RNA is  generally detectable in upper and lower  respiratory specimens dur ing the acute phase of infection.  Positive  results are indicative of active infection with SARS-CoV-2.  Clinical  correlation with patient history and other diagnostic information is  necessary to determine  patient infection status.  Positive results do  not rule out bacterial infection or co-infection with other viruses. If result is PRESUMPTIVE POSTIVE SARS-CoV-2 nucleic acids MAY BE PRESENT.   A presumptive positive result was obtained on the submitted specimen  and confirmed on repeat testing.  While 2019 novel coronavirus  (SARS-CoV-2) nucleic acids may be present in the submitted sample  additional confirmatory testing may be necessary for epidemiological  and / or clinical management purposes  to differentiate between  SARS-CoV-2 and other Sarbecovirus currently known to infect humans.  If clinically indicated additional testing with an alternate test  methodology [phone]) is advised. The SARS-CoV-2 RNA is generally  detectable in upper and lower respiratory sp ecimens during the acute  phase of infection. The expected result is Negative. Fact Sheet for Patients:  StrictlyIdeas.no Fact Sheet for Healthcare Providers: BankingDealers.co.za This test is not yet approved or cleared by the [place] FDA and has been authorized for detection and/or diagnosis of SARS-CoV-2 by FDA under an Emergency Use Authorization (EUA).  This EUA will remain in effect (meaning this test can be used) for the duration of the COVID-19 declaration under Section 564(b)(1) of the Act, 21 U.S.C. section 360bbb-3(b)(1), unless the authorization is terminated or revoked sooner. Performed at [facility], [place] [address] [place], [place] [address]   MRSA PCR Screening     Status: None   Collection Time: 06/28/19  4:29 PM   Specimen: Nasal Mucosa; Nasopharyngeal   Result Value Ref Range Status   MRSA by PCR NEGATIVE NEGATIVE Final    Comment:        The GeneXpert MRSA Assay (FDA approved for NASAL specimens only), is one component of a comprehensive MRSA colonization surveillance program. It is not intended to diagnose MRSA infection nor to guide or monitor treatment for MRSA infections. Performed at [facility], [place] [address] [place], [place] [address]          Radiology Studies: No results found.      Scheduled Meds: . aspirin EC  81 mg Oral Daily  . carvedilol  3.125 mg Oral BID WC  . Chlorhexidine Gluconate Cloth  6 each Topical Daily  . saccharomyces boulardii  250 mg Oral BID  . sodium chloride flush  3 mL Intravenous Q12H  . sodium chloride flush  3 mL Intravenous Q12H   Continuous Infusions: . sodium chloride 75 mL/hr at 06/28/19 0302  . sodium chloride    . aztreonam 1 g (06/30/19 0522)  . vancomycin 750 mg (06/30/19 1057)     LOS: 5 days    Georgette Shell, MD [facility]  If 7PM-7AM, please contact night-coverage [email or web address] Password [email or web address] 06/30/2019, 12:55 PM

## 2019-07-01 ENCOUNTER — Encounter (HOSPITAL_COMMUNITY): Payer: Self-pay | Admitting: Certified Registered"

## 2019-07-01 ENCOUNTER — Inpatient Hospital Stay (HOSPITAL_COMMUNITY): Payer: Medicare HMO | Admitting: Anesthesiology

## 2019-07-01 ENCOUNTER — Encounter (HOSPITAL_COMMUNITY)
Admission: EM | Disposition: A | Discharge status: Home Health | Payer: Self-pay | Source: Home / Self Care | Attending: Internal Medicine

## 2019-07-01 DIAGNOSIS — I96 Gangrene, not elsewhere classified: Secondary | ICD-10-CM

## 2019-07-01 HISTORY — PX: AMPUTATION: SHX166

## 2019-07-01 LAB — COMPREHENSIVE METABOLIC PANEL
ALT: 12 U/L (ref 0–44)
AST: 22 U/L (ref 15–41)
Albumin: 1.9 g/dL — ABNORMAL LOW (ref 3.5–5.0)
Alkaline Phosphatase: 75 U/L (ref 38–126)
Anion gap: 9 (ref 5–15)
BUN: 11 mg/dL (ref 8–23)
CO2: 22 mmol/L (ref 22–32)
Calcium: 7.2 mg/dL — ABNORMAL LOW (ref 8.9–10.3)
Chloride: 107 mmol/L (ref 98–111)
Creatinine, Ser: 1.02 mg/dL — ABNORMAL HIGH (ref 0.44–1.00)
GFR calc Af Amer: >60 mL/min (ref >60)
GFR calc non Af Amer: 57 mL/min — ABNORMAL LOW (ref >60)
Glucose, Bld: 94 mg/dL (ref 70–99)
Potassium: 3.5 mmol/L (ref 3.5–5.1)
Sodium: 138 mmol/L (ref 135–145)
Total Bilirubin: 0.8 mg/dL (ref 0.3–1.2)
Total Protein: 5.3 g/dL — ABNORMAL LOW (ref 6.5–8.1)

## 2019-07-01 LAB — URINE CULTURE: Culture: NO GROWTH

## 2019-07-01 LAB — SURGICAL PCR SCREEN
MRSA, PCR: NEGATIVE
Staphylococcus aureus: POSITIVE — AB

## 2019-07-01 SURGERY — AMPUTATION, ABOVE KNEE
Anesthesia: General | Site: Knee | Laterality: Left

## 2019-07-01 MED ORDER — DEXAMETHASONE SODIUM PHOSPHATE 10 MG/ML IJ SOLN
INTRAMUSCULAR | Status: AC
  Filled 2019-07-01: qty 1

## 2019-07-01 MED ORDER — FENTANYL CITRATE (PF) 250 MCG/5ML IJ SOLN
INTRAMUSCULAR | Status: AC
  Filled 2019-07-01: qty 5

## 2019-07-01 MED ORDER — ONDANSETRON HCL 4 MG/2ML IJ SOLN
INTRAMUSCULAR | Status: AC
  Filled 2019-07-01: qty 2

## 2019-07-01 MED ORDER — ONDANSETRON HCL 4 MG/2ML IJ SOLN: 4.0000 mg | Freq: Once | INTRAMUSCULAR | Status: DC | PRN

## 2019-07-01 MED ORDER — OXYCODONE HCL 5 MG PO TABS
5.0000 mg | ORAL_TABLET | Freq: Once | ORAL | Status: AC | PRN
  Administered 2019-07-01: 5 mg via ORAL

## 2019-07-01 MED ORDER — ONDANSETRON HCL 4 MG/2ML IJ SOLN
INTRAMUSCULAR | Status: DC | PRN
  Administered 2019-07-01: 4 mg via INTRAVENOUS

## 2019-07-01 MED ORDER — FENTANYL CITRATE (PF) 100 MCG/2ML IJ SOLN
25.0000 ug | INTRAMUSCULAR | Status: DC | PRN
  Administered 2019-07-01 (×3): 50 ug via INTRAVENOUS

## 2019-07-01 MED ORDER — OXYCODONE HCL 5 MG PO TABS
ORAL_TABLET | ORAL | Status: AC
  Filled 2019-07-01: qty 1

## 2019-07-01 MED ORDER — ACETAMINOPHEN 325 MG PO TABS: 325.0000 mg | ORAL_TABLET | ORAL | Status: DC | PRN

## 2019-07-01 MED ORDER — MEPERIDINE HCL 25 MG/ML IJ SOLN: 6.2500 mg | INTRAMUSCULAR | Status: DC | PRN

## 2019-07-01 MED ORDER — MIDAZOLAM HCL 5 MG/5ML IJ SOLN
INTRAMUSCULAR | Status: DC | PRN
  Administered 2019-07-01: 1 mg via INTRAVENOUS

## 2019-07-01 MED ORDER — PHENYLEPHRINE 40 MCG/ML (10ML) SYRINGE FOR IV PUSH (FOR BLOOD PRESSURE SUPPORT)
PREFILLED_SYRINGE | INTRAVENOUS | Status: DC | PRN
  Administered 2019-07-01: 80 ug via INTRAVENOUS
  Administered 2019-07-01: 100 ug via INTRAVENOUS
  Administered 2019-07-01: 140 ug via INTRAVENOUS
  Administered 2019-07-01: 80 ug via INTRAVENOUS

## 2019-07-01 MED ORDER — FENTANYL CITRATE (PF) 100 MCG/2ML IJ SOLN
INTRAMUSCULAR | Status: AC
  Filled 2019-07-01: qty 2

## 2019-07-01 MED ORDER — ACETAMINOPHEN 160 MG/5ML PO SOLN: 325.0000 mg | ORAL | Status: DC | PRN

## 2019-07-01 MED ORDER — 0.9 % SODIUM CHLORIDE (POUR BTL) OPTIME
TOPICAL | Status: DC | PRN
  Administered 2019-07-01: 1000 mL

## 2019-07-01 MED ORDER — FENTANYL CITRATE (PF) 250 MCG/5ML IJ SOLN
INTRAMUSCULAR | Status: DC | PRN
  Administered 2019-07-01: 25 ug via INTRAVENOUS
  Administered 2019-07-01: 50 ug via INTRAVENOUS
  Administered 2019-07-01 (×5): 25 ug via INTRAVENOUS

## 2019-07-01 MED ORDER — FENTANYL CITRATE (PF) 100 MCG/2ML IJ SOLN: 25.0000 ug | INTRAMUSCULAR | Status: DC | PRN

## 2019-07-01 MED ORDER — LACTATED RINGERS IV SOLN
INTRAVENOUS | Status: DC
  Administered 2019-07-01: 11:00:00 via INTRAVENOUS

## 2019-07-01 MED ORDER — LIDOCAINE 2% (20 MG/ML) 5 ML SYRINGE
INTRAMUSCULAR | Status: AC
  Filled 2019-07-01: qty 5

## 2019-07-01 MED ORDER — EPHEDRINE 5 MG/ML INJ
INTRAVENOUS | Status: AC
  Filled 2019-07-01: qty 10

## 2019-07-01 MED ORDER — LIDOCAINE 2% (20 MG/ML) 5 ML SYRINGE
INTRAMUSCULAR | Status: DC | PRN
  Administered 2019-07-01: 60 mg via INTRAVENOUS

## 2019-07-01 MED ORDER — MUPIROCIN 2 % EX OINT
1.0000 "application " | TOPICAL_OINTMENT | Freq: Two times a day (BID) | CUTANEOUS | Status: AC
  Administered 2019-07-01 – 2019-07-05 (×9): 1 via NASAL
  Filled 2019-07-01 (×3): qty 22

## 2019-07-01 MED ORDER — EPHEDRINE SULFATE-NACL 50-0.9 MG/10ML-% IV SOSY
PREFILLED_SYRINGE | INTRAVENOUS | Status: DC | PRN
  Administered 2019-07-01: 10 mg via INTRAVENOUS
  Administered 2019-07-01: 5 mg via INTRAVENOUS

## 2019-07-01 MED ORDER — MIDAZOLAM HCL 2 MG/2ML IJ SOLN
INTRAMUSCULAR | Status: AC
  Filled 2019-07-01: qty 2

## 2019-07-01 MED ORDER — OXYCODONE HCL 5 MG/5ML PO SOLN: 5.0000 mg | Freq: Once | ORAL | Status: AC | PRN

## 2019-07-01 MED ORDER — DEXAMETHASONE SODIUM PHOSPHATE 10 MG/ML IJ SOLN
INTRAMUSCULAR | Status: DC | PRN
  Administered 2019-07-01: 5 mg via INTRAVENOUS

## 2019-07-01 MED ORDER — LACTATED RINGERS IV SOLN
INTRAVENOUS | Status: DC | PRN
  Administered 2019-07-01: 12:00:00 via INTRAVENOUS

## 2019-07-01 MED ORDER — PROPOFOL 10 MG/ML IV BOLUS
INTRAVENOUS | Status: AC
  Filled 2019-07-01: qty 20

## 2019-07-01 MED ORDER — OXYCODONE HCL 5 MG/5ML PO SOLN: 5.0000 mg | Freq: Once | ORAL | Status: DC | PRN

## 2019-07-01 MED ORDER — PROPOFOL 10 MG/ML IV BOLUS
INTRAVENOUS | Status: DC | PRN
  Administered 2019-07-01: 150 mg via INTRAVENOUS

## 2019-07-01 MED ORDER — OXYCODONE HCL 5 MG PO TABS: 5.0000 mg | ORAL_TABLET | Freq: Once | ORAL | Status: DC | PRN

## 2019-07-01 MED ORDER — ROCURONIUM BROMIDE 10 MG/ML (PF) SYRINGE
PREFILLED_SYRINGE | INTRAVENOUS | Status: AC
  Filled 2019-07-01: qty 10

## 2019-07-01 MED ORDER — CHLORHEXIDINE GLUCONATE CLOTH 2 % EX PADS
6.0000 | MEDICATED_PAD | Freq: Every day | CUTANEOUS | Status: AC
  Administered 2019-07-05: 6 via TOPICAL

## 2019-07-01 SURGICAL SUPPLY — 45 items
BANDAGE ESMARK 6X9 LF (GAUZE/BANDAGES/DRESSINGS) IMPLANT
BLADE SAW GIGLI 510 (BLADE) ×2 IMPLANT
BNDG CMPR 9X6 STRL LF SNTH (GAUZE/BANDAGES/DRESSINGS)
BNDG ELASTIC 4X5.8 VLCR STR LF (GAUZE/BANDAGES/DRESSINGS) ×2 IMPLANT
BNDG ELASTIC 6X5.8 VLCR STR LF (GAUZE/BANDAGES/DRESSINGS) ×2 IMPLANT
BNDG ESMARK 6X9 LF (GAUZE/BANDAGES/DRESSINGS)
BNDG GAUZE ELAST 4 BULKY (GAUZE/BANDAGES/DRESSINGS) ×3 IMPLANT
CANISTER SUCT 3000ML PPV (MISCELLANEOUS) ×2 IMPLANT
CLIP LIGATING EXTRA MED SLVR (CLIP) ×2 IMPLANT
CLIP LIGATING EXTRA SM BLUE (MISCELLANEOUS) ×2 IMPLANT
COVER SURGICAL LIGHT HANDLE (MISCELLANEOUS) ×4 IMPLANT
COVER WAND RF STERILE (DRAPES) ×1 IMPLANT
CUFF TOURN SGL QUICK 34 (TOURNIQUET CUFF)
CUFF TOURN SGL QUICK 42 (TOURNIQUET CUFF) IMPLANT
CUFF TRNQT CYL 34X4.125X (TOURNIQUET CUFF) IMPLANT
DRAIN SNY 10X20 3/4 PERF (WOUND CARE) IMPLANT
DRAPE HALF SHEET 40X57 (DRAPES) ×2 IMPLANT
DRAPE ORTHO SPLIT 77X108 STRL (DRAPES) ×4
DRAPE SURG ORHT 6 SPLT 77X108 (DRAPES) ×2 IMPLANT
DRSG ADAPTIC 3X8 NADH LF (GAUZE/BANDAGES/DRESSINGS) ×1 IMPLANT
ELECT CAUTERY BLADE 6.4 (BLADE) ×2 IMPLANT
ELECT REM PT RETURN 9FT ADLT (ELECTROSURGICAL) ×2
ELECTRODE REM PT RTRN 9FT ADLT (ELECTROSURGICAL) ×1 IMPLANT
EVACUATOR SILICONE 100CC (DRAIN) IMPLANT
GAUZE SPONGE 4X4 12PLY STRL (GAUZE/BANDAGES/DRESSINGS) ×3 IMPLANT
GAUZE XEROFORM 5X9 LF (GAUZE/BANDAGES/DRESSINGS) ×2 IMPLANT
GLOVE SS BIOGEL STRL SZ 7.5 (GLOVE) ×1 IMPLANT
GLOVE SUPERSENSE BIOGEL SZ 7.5 (GLOVE) ×1
GOWN STRL REUS W/ TWL LRG LVL3 (GOWN DISPOSABLE) ×3 IMPLANT
GOWN STRL REUS W/TWL LRG LVL3 (GOWN DISPOSABLE) ×6
KIT BASIN OR (CUSTOM PROCEDURE TRAY) ×2 IMPLANT
KIT TURNOVER KIT B (KITS) ×2 IMPLANT
NS IRRIG 1000ML POUR BTL (IV SOLUTION) ×2 IMPLANT
PACK GENERAL/GYN (CUSTOM PROCEDURE TRAY) ×2 IMPLANT
PAD ARMBOARD 7.5X6 YLW CONV (MISCELLANEOUS) ×4 IMPLANT
PADDING CAST COTTON 6X4 STRL (CAST SUPPLIES) IMPLANT
STAPLER VISISTAT 35W (STAPLE) ×2 IMPLANT
STOCKINETTE IMPERVIOUS LG (DRAPES) ×2 IMPLANT
SUT ETHILON 3 0 PS 1 (SUTURE) IMPLANT
SUT VIC AB 0 CT1 18XCR BRD 8 (SUTURE) ×2 IMPLANT
SUT VIC AB 0 CT1 8-18 (SUTURE) ×4
SUT VICRYL AB 2 0 TIES (SUTURE) ×2 IMPLANT
TOWEL GREEN STERILE (TOWEL DISPOSABLE) ×4 IMPLANT
UNDERPAD 30X30 (UNDERPADS AND DIAPERS) ×2 IMPLANT
WATER STERILE IRR 1000ML POUR (IV SOLUTION) ×2 IMPLANT

## 2019-07-01 NOTE — Anesthesia Postprocedure Evaluation (Signed)
Anesthesia Post Note  Patient: Wendy Townsend  Procedure(s) Performed: AMPUTATION ABOVE KNEE, left (Left Knee)     Patient location during evaluation: PACU Anesthesia Type: General Level of consciousness: awake and alert Pain management: pain level controlled Vital Signs Assessment: post-procedure vital signs reviewed and stable Respiratory status: spontaneous breathing, nonlabored ventilation, respiratory function stable and patient connected to nasal cannula oxygen Cardiovascular status: blood pressure returned to baseline and stable Postop Assessment: no apparent nausea or vomiting Anesthetic complications: no    Last Vitals:  Vitals:   07/01/19 0344 07/01/19 0914  BP: 129/89 (!) 129/93  Pulse: 88 84  Resp: 16 13  Temp: 36.4 C 36.8 C  SpO2: 100% 98%    Last Pain:  Vitals:   07/01/19 0914  TempSrc: Oral  PainSc: 9                  Brienne Liguori

## 2019-07-01 NOTE — Anesthesia Preprocedure Evaluation (Addendum)
Anesthesia Evaluation  Patient identified by MRN, date of birth, ID band Patient awake    Reviewed: Allergy & Precautions, NPO status , Patient's Chart, lab work & pertinent test results  History of Anesthesia Complications Negative for: history of anesthetic complications  Airway Mallampati: III  TM Distance: >3 FB Neck ROM: Full    Dental  (+) Poor Dentition, Loose, Missing,    Pulmonary neg pulmonary ROS, Patient abstained from smoking., former smoker,    Pulmonary exam normal        Cardiovascular hypertension, + CAD and + Peripheral Vascular Disease  Normal cardiovascular exam     Neuro/Psych CVA negative psych ROS   GI/Hepatic negative GI ROS, Neg liver ROS,   Endo/Other  negative endocrine ROS  Renal/GU ARF and CRFRenal disease  negative genitourinary   Musculoskeletal negative musculoskeletal ROS (+)   Abdominal   Peds  Hematology  (+) Blood dyscrasia, anemia ,   Anesthesia Other Findings  PMH: CVA, HTN, PVD, AKI on CKD  TTE 07/10/16: EF 65-70%, grade 1 DD  LHC 07/11/16: mild-mod nonobstructive CAD  Reproductive/Obstetrics                            Anesthesia Physical  Anesthesia Plan  ASA: III  Anesthesia Plan: General   Post-op Pain Management:    Induction: Intravenous  PONV Risk Score and Plan: 3 and Ondansetron, Dexamethasone, Midazolam and Treatment may vary due to age or medical condition  Airway Management Planned: Oral ETT and LMA  Additional Equipment: None  Intra-op Plan:   Post-operative Plan: Extubation in OR  Informed Consent: I have reviewed the patients History and Physical, chart, labs and discussed the procedure including the risks, benefits and alternatives for the proposed anesthesia with the patient or authorized representative who has indicated his/her understanding and acceptance.     Dental advisory given  Plan Discussed with:  Anesthesiologist, CRNA and Surgeon  Anesthesia Plan Comments:        Anesthesia Quick Evaluation

## 2019-07-01 NOTE — Transfer of Care (Signed)
Immediate Anesthesia Transfer of Care Note  Patient: Wendy Townsend  Procedure(s) Performed: AMPUTATION ABOVE KNEE, left (Left Knee)  Patient Location: PACU  Anesthesia Type:General  Level of Consciousness: awake, alert , oriented and patient cooperative  Airway & Oxygen Therapy: Patient Spontanous Breathing and Patient connected to nasal cannula oxygen  Post-op Assessment: Report given to RN, Post -op Vital signs reviewed and stable and Patient moving all extremities  Post vital signs: Reviewed and stable  Last Vitals:  Vitals Value Taken Time  BP 154/95 07/01/19 1321  Temp    Pulse 75 07/01/19 1321  Resp 23 07/01/19 1326  SpO2 99 % 07/01/19 1321  Vitals shown include unvalidated device data.  Last Pain:  Vitals:   07/01/19 0914  TempSrc: Oral  PainSc: 9       Patients Stated Pain Goal: 0 ([id number])  Complications: No apparent anesthesia complications

## 2019-07-01 NOTE — Interval H&P Note (Signed)
History and Physical Interval Note:  07/01/2019 11:09 AM  Brettany Kopas  has presented today for surgery, with the diagnosis of ESRD.  The various methods of treatment have been discussed with the patient and family. After consideration of risks, benefits and other options for treatment, the patient has consented to  Procedure(s): AMPUTATION ABOVE KNEE (Left) as a surgical intervention.  The patient's history has been reviewed, patient examined, no change in status, stable for surgery.  I have reviewed the patient's chart and labs.  Questions were answered to the patient's satisfaction.     Curt Jews

## 2019-07-01 NOTE — Progress Notes (Signed)
PROGRESS NOTE    Wendy Townsend  [id number] DOB: 06-10-1951 DOA: 06/25/2019 PCP: Sandrea Hughs, NP  Brief Narrative:  68 year old woman PMH peripheral vascular disease, CKD stage III, stroke PRESENTED to [place] [facility] the emergency department with 2 days of severe foot pain. Ultrasound showed occlusion of left femoral popliteal bypass. EDP discussed with vascular surgery with recommendation for heparin and transferred to [facility]. Not clear that surgery will be required.  Patient reports she has had scaling skin since her surgery. She has had intermittent waxing and waning pain in her foot. However approximately 2 days ago she developed intense pain in the left foot, severe, unable to ambulate on it. Improved with pain medication in the emergency department. No specific aggravating or alleviating factors. No specific associated symptoms noted.  Chart review:  1/15/2020presented to the hospital with profound ischemia of her left foot with occlusion of her left limb of her aortofemoral graft and her left femoropopliteal bypass. She was taken emergently to the operating room where she underwent attempted thrombectomy of the left limb. This was not possible. She underwent right to left femorofemoral bypass and thrombectomy of her left femoral to popliteal bypass both through the groin and through the below-knee popliteal level. She did well in the hospital and was discharged home  ED Course:Notes: Presented from home, hard time walking secondary to left leg pain. EDP noted patient presented with questionable wound infection left leg/foot, dealing with dry skin. 1 2 weeks things have been getting worse. Per EMS fever 101.2. EDP was unable to palpate DP, PT pulse. Unable to obtain dopplerable pulses DP, PT, popliteal. Femoral pulses noted.  06/26/2019 patient resting in bed complaining of severe left lower extremity pain  10/8 s/p thrombolysis 10/7 some improvement but still  with lots of pain 06/28/2019 patient resting in bed does complain of pain in the left lower extremity. Overnight events noted with arrhythmias .Marland KitchenNow in sinus tach..Hemoglobin was 5.82 units of blood  transfusion ordered hypokalemia noted IV potassium ordered, magnesium severely low at 0.9 IV magnesium ordered 10/10 better but still with lot of pain left leg  06/30/2019 patient resting in bed consented to have left AKA tomorrow, had bowel movements no diarrhea no nausea vomiting.  Pain better with oxycodone   Assessment & Plan:   Principal Problem:   Left foot pain Active Problems:   Ischemic pain of foot, left   AKI (acute kidney injury) ([place])   Dehydration   CKD (chronic kidney disease), stage III   Anemia in chronic kidney disease (CKD)  #1 left lower extremity ischemia status post left below-knee popliteal artery angioplasty and thrombolysis/gangrene-patient scheduled to have left BKA today Monday.  Patient was on IV heparin this was stopped due to acute drop in hemoglobin.  Patient will need long-term anticoagulation once all the surgical procedures are over to preserve the remaining grafts.  Continue vancomycin and aztreonam.   #2 AKI on CKD stage IIIresolved with IV fluids creatinine down to 0.99. ACE inhibitor on hold. HCTZ on hold.  #3 hypertensionpatient was hypotensive on admission which is resolved with IV hydration.  With the BP trending up coreg  and Catapres was restarted.  Blood pressure now stable. Continue to hold HCTZ  And zestril.  #4 history of stroke with mild left-sided weakness on aspirin at home.Aspirin has not been restarted.  #5 history of tobacco use and substance abuse counseled.  #6Acute blood loss anemia hemoglobindropped to 5.5 on 06/28/2019 she received 2 units of blood transfusion her hemoglobin  is up to 11.2.    Follow-up labs tomorrow.  #7 hyponatremiaresolvedwith normal saline patient was dry and dehydrated at the time of  admission in addition to being on hydrochlorothiazide and ACE.  #8 hypokalemiarepleted #9 hypomagnesemia  repleted  #10 leukocytosis resolving due to #1    Estimated body mass index is 25.23 kg/m as calculated from the following:   Height as of this encounter: 5\' 4"  (1.626 m).   Weight as of this encounter: 66.7 kg.  DVT prophylaxis:scdpatient was on IV heparin this was stopped due to acute drop in hemoglobin code Status:Full code  family Communication:No family available Disposition Plan:Pending clinical improvement Consultants:  Vascular surgery  Procedures:Thrombolysis 06/26/2019 Left below-knee popliteal artery angioplasty 06/27/2019 Antimicrobials:vanco and aztreonam   Subjective:  Patient resting in bed anxious to have surgery today denies any new complaints Objective: Vitals:   06/30/19 1657 06/30/19 2104 07/01/19 0344 07/01/19 0914  BP: 136/87 121/68 129/89 (!) 129/93  Pulse: 90 87 88 84  Resp: 16 16 16 13   Temp:  98 F (36.7 C) 97.6 F (36.4 C) 98.2 F (36.8 C)  TempSrc:  Oral Oral Oral  SpO2: 100% 95% 100% 98%  Weight:      Height:       No intake or output data in the 24 hours ending 07/01/19 1020 Filed Weights   06/25/19 1826  Weight: 66.7 kg    Examination:  General exam: Appears calm and comfortable  Respiratory system: Clear to auscultation. Respiratory effort normal. Cardiovascular system: S1 & S2 heard, RRR. No JVD, murmurs, rubs, gallops or clicks. No pedal edema. Gastrointestinal system: Abdomen is nondistended, soft and nontender. No organomegaly or masses felt. Normal bowel sounds heard. Central nervous system: Alert and oriented. No focal neurological deficits. Extremities: Left lower extremity wrapped with soaked dressings noted with foul smell Skin: No rashes, lesions or ulcers Psychiatry: Judgement and insight appear normal. Mood & affect appropriate.     Data Reviewed: I have personally reviewed following labs and  imaging studies  CBC: Recent Labs  Lab 06/25/19 1434  06/27/19 1025 06/28/19 0108 06/28/19 0347 06/28/19 1600 06/29/19 0251 06/30/19 0510  WBC 17.5*   < > 18.1* 24.5* 22.7*  --  17.1* 13.4*  NEUTROABS 14.1*  --   --  20.9*  --   --   --   --   HGB 9.6*   < > 7.6* 5.8* 5.5* 9.7* 7.9* 11.2*  HCT 29.2*   < > 21.8* 16.9* 16.3* 26.9* 21.9* 31.9*  MCV 93.0   < > 90.5 91.8 93.1  --  86.6 88.6  PLT 377   < > 270 218 230  --  161 169   < > = values in this interval not displayed.   Basic Metabolic Panel: Recent Labs  Lab 06/26/19 0514  06/27/19 0544 06/27/19 1025 06/28/19 0108 06/28/19 0347 06/28/19 1600 06/29/19 0251 06/30/19 0510 07/01/19 0458  NA 134*  --  140  --  137  --   --   --  138 138  K 3.3*  --  3.3*  --  3.4*  --   --   --  3.4* 3.5  CL 101  --  109  --  109  --   --   --  110 107  CO2 20*  --  19*  --  18*  --   --   --  18* 22  GLUCOSE 123*  --  112*  --  145*  --   --   --  99 94  BUN 41*  --  24*  --  25*  --   --   --  14 11  CREATININE 2.03*   < > 1.24*  --  1.32* 1.32*  --  0.99 1.05* 1.02*  CALCIUM 8.1*  --  7.9*  --  7.2*  --   --   --  7.4* 7.2*  MG  --   --   --  1.0* 0.9*  --  2.5* 1.9  --   --    < > = values in this interval not displayed.   GFR: Estimated Creatinine Clearance: 50.3 mL/min (A) (by C-G formula based on SCr of 1.02 mg/dL (H)). Liver Function Tests: Recent Labs  Lab 06/25/19 1434 06/28/19 0108 06/30/19 0510 07/01/19 0458  AST 27 16 29 22   ALT 12 10 12 12   ALKPHOS 97 69 70 75  BILITOT 1.5* 0.9 1.0 0.8  PROT 7.9 5.2* 5.3* 5.3*  ALBUMIN 3.5 1.9* 1.9* 1.9*   No results for input(s): LIPASE, AMYLASE in the last 168 hours. No results for input(s): AMMONIA in the last 168 hours. Coagulation Profile: Recent Labs  Lab 06/26/19 0514  INR 1.2   Cardiac Enzymes: No results for input(s): CKTOTAL, CKMB, CKMBINDEX, TROPONINI in the last 168 hours. BNP (last 3 results) No results for input(s): PROBNP in the last 8760 hours. HbA1C:  No results for input(s): HGBA1C in the last 72 hours. CBG: Recent Labs  Lab 06/26/19 2132 06/27/19 0039 06/27/19 0425 06/27/19 0819  GLUCAP 116* 109* 100* 100*   Lipid Profile: No results for input(s): CHOL, HDL, LDLCALC, TRIG, CHOLHDL, LDLDIRECT in the last 72 hours. Thyroid Function Tests: No results for input(s): TSH, T4TOTAL, FREET4, T3FREE, THYROIDAB in the last 72 hours. Anemia Panel: No results for input(s): VITAMINB12, FOLATE, FERRITIN, TIBC, IRON, RETICCTPCT in the last 72 hours. Sepsis Labs: Recent Labs  Lab 06/25/19 1434 06/25/19 1642  LATICACIDVEN 1.7 1.6    Recent Results (from the past 240 hour(s))  [facility] Coronavirus 2 [facility], Performed in [facility] hospital lab) Nasopharyngeal Nasopharyngeal Swab     Status: None   Collection Time: 06/25/19  2:57 PM   Specimen: Nasopharyngeal Swab  Result Value Ref Range Status   SARS Coronavirus 2 NEGATIVE NEGATIVE Final    Comment: (NOTE) If result is NEGATIVE SARS-CoV-2 target nucleic acids are NOT DETECTED. The SARS-CoV-2 RNA is generally detectable in upper and lower  respiratory specimens during the acute phase of infection. The lowest  concentration of SARS-CoV-2 viral copies this assay can detect is 250  copies / mL. A negative result does not preclude SARS-CoV-2 infection  and should not be used as the sole basis for treatment or other  patient management decisions.  A negative result may occur with  improper specimen collection / handling, submission of specimen other  than nasopharyngeal swab, presence of viral mutation(s) within the  areas targeted by this assay, and inadequate number of viral copies  (<250 copies / mL). A negative result must be combined with clinical  observations, patient history, and epidemiological information. If result is POSITIVE SARS-CoV-2 target nucleic acids are DETECTED. The SARS-CoV-2 RNA is generally detectable in upper and lower  respiratory specimens dur ing the  acute phase of infection.  Positive  results are indicative of active infection with SARS-CoV-2.  Clinical  correlation with patient history and other diagnostic information is  necessary to determine patient infection status.  Positive results do  not rule out bacterial infection  or co-infection with other viruses. If result is PRESUMPTIVE POSTIVE SARS-CoV-2 nucleic acids MAY BE PRESENT.   A presumptive positive result was obtained on the submitted specimen  and confirmed on repeat testing.  While 2019 novel coronavirus  (SARS-CoV-2) nucleic acids may be present in the submitted sample  additional confirmatory testing may be necessary for epidemiological  and / or clinical management purposes  to differentiate between  SARS-CoV-2 and other Sarbecovirus currently known to infect humans.  If clinically indicated additional testing with an alternate test  methodology [phone]) is advised. The SARS-CoV-2 RNA is generally  detectable in upper and lower respiratory sp ecimens during the acute  phase of infection. The expected result is Negative. Fact Sheet for Patients:  StrictlyIdeas.no Fact Sheet for Healthcare Providers: BankingDealers.co.za This test is not yet approved or cleared by the [place] FDA and has been authorized for detection and/or diagnosis of SARS-CoV-2 by FDA under an Emergency Use Authorization (EUA).  This EUA will remain in effect (meaning this test can be used) for the duration of the COVID-19 declaration under Section 564(b)(1) of the Act, 21 U.S.C. section 360bbb-3(b)(1), unless the authorization is terminated or revoked sooner. Performed at [facility], [place] [address] [place], [place] [address]   MRSA PCR Screening     Status: None   Collection Time: 06/28/19  4:29 PM   Specimen: Nasal Mucosa; Nasopharyngeal  Result Value Ref Range Status   MRSA by PCR NEGATIVE NEGATIVE Final    Comment:         The GeneXpert MRSA Assay (FDA approved for NASAL specimens only), is one component of a comprehensive MRSA colonization surveillance program. It is not intended to diagnose MRSA infection nor to guide or monitor treatment for MRSA infections. Performed at [facility], [place] [address] [place], [place] [address]   Urine Culture     Status: None   Collection Time: 06/30/19 12:15 AM   Specimen: Urine, Random  Result Value Ref Range Status   Specimen Description URINE, RANDOM  Final   Special Requests NONE  Final   Culture   Final    NO GROWTH Performed at [facility], [place] [address], [place], [place] [address]    Report Status 07/01/2019 FINAL  Final  Surgical pcr screen     Status: Abnormal   Collection Time: 07/01/19  1:05 AM   Specimen: Nasal Mucosa; Nasal Swab  Result Value Ref Range Status   MRSA, PCR NEGATIVE NEGATIVE Final   Staphylococcus aureus POSITIVE (A) NEGATIVE Final    Comment: (NOTE) The Xpert SA Assay (FDA approved for NASAL specimens in patients 36 years of age and older), is one component of a comprehensive surveillance program. It is not intended to diagnose infection nor to guide or monitor treatment. Performed at [facility], [place] [address] [place], [place] [address]          Radiology Studies: No results found.      Scheduled Meds: . [MAR Hold] aspirin EC  81 mg Oral Daily  . [MAR Hold] carvedilol  3.125 mg Oral BID WC  . [MAR Hold] Chlorhexidine Gluconate Cloth  6 each Topical Daily  . [MAR Hold] Chlorhexidine Gluconate Cloth  6 each Topical Daily  . [MAR Hold] mupirocin ointment  1 application Nasal BID  . [MAR Hold] saccharomyces boulardii  250 mg Oral BID  . [MAR Hold] sodium chloride flush  3 mL Intravenous Q12H  . [MAR Hold] sodium chloride  flush  3 mL Intravenous Q12H   Continuous Infusions: . [MAR Hold] sodium chloride    . [MAR Hold] aztreonam 1 g (07/01/19 0504)  . [MAR Hold] vancomycin 750 mg  (06/30/19 1057)     LOS: 6 days     Georgette Shell, MD [facility]  If 7PM-7AM, please contact night-coverage [email or web address] Password [email or web address] 07/01/2019, 10:20 AM

## 2019-07-01 NOTE — Progress Notes (Signed)
Pt received From PACU. Pt c/o/x4. Pt states she has some pain. Vitals stable. PT given call bell. Will continue to monitor. Jerald Kief, RN

## 2019-07-01 NOTE — Op Note (Signed)
    OPERATIVE REPORT  DATE OF SURGERY: 07/01/2019  PATIENT: Wendy Townsend, 68 y.o. female MRN: [id number]  DOB: December 07, 1950  PRE-OPERATIVE DIAGNOSIS: Gangrene left foot  POST-OPERATIVE DIAGNOSIS:  Same  PROCEDURE: Left above-knee amputation  SURGEON:  Curt Jews, M.D.  PHYSICIAN ASSISTANT: Nurse  ANESTHESIA: General  EBL: per anesthesia record  Total I/O In: 800 [I.V.:800] Out: 300 [Blood:300]  BLOOD ADMINISTERED: none  DRAINS: none  SPECIMEN: none  COUNTS CORRECT:  YES  PATIENT DISPOSITION:  PACU - hemodynamically stable  PROCEDURE DETAILS: Patient was taken operating placed supine position area of the left leg was prepped draped you sterile fashion.  [place] incision was made over the distal thigh above the knee and carried down through the subcutaneous fat and fascia with electrocautery.  The muscle bodies all appear to be viable.  The neurovascular bundle was identified in the superficial femoral artery and femoral vein were individually ligated.  The patient had 2 separate prior Gore-Tex graft.  1 of these was patent and one was occluded.  The Gore-Tex grafts were dissected further proximally and were ligated and divided.  The sciatic nerve was ligated proximally and divided.  The periosteum was elevated on the femur and the femur was divided 3 to 4 cm proximal to the skin edge.  The bone edges were removed were smoothed with a bone rasp after dividing the femur with a Gigli saw.  The wounds irrigated with saline.  Hemostasis to electrocautery.  The anterior fascia was closed to the posterior fascia with interrupted figure-of-eight 0 Vicryl sutures.  The wound again was irrigated and the skin edges were closed with skin staples.  Sterile dressing and Ace wrap were applied and the patient was transferred to the recovery room in stable condition   Rosetta Posner, M.D., [facility], [place] 07/01/2019 3:04 PM

## 2019-07-01 NOTE — Anesthesia Procedure Notes (Signed)
Procedure Name: LMA Insertion Date/Time: 07/01/2019 11:57 AM Performed by: Raenette Rover, CRNA Pre-anesthesia Checklist: Patient identified, Emergency Drugs available, Suction available and Patient being monitored Patient Re-evaluated:Patient Re-evaluated prior to induction Oxygen Delivery Method: Circle system utilized Preoxygenation: Pre-oxygenation with 100% oxygen Induction Type: IV induction LMA: LMA inserted LMA Size: 4.0 Number of attempts: 1 Placement Confirmation: positive ETCO2 and breath sounds checked- equal and bilateral Tube secured with: Tape Dental Injury: Teeth and Oropharynx as per pre-operative assessment

## 2019-07-01 NOTE — Care Management Important Message (Signed)
Important Message  Patient Details  Name: Anamae Barnick MRN: [id number] Date of Birth: 04-06-1951   Medicare Important Message Given:  Yes     Shelda Altes 07/01/2019, 3:26 PM

## 2019-07-02 ENCOUNTER — Encounter (HOSPITAL_COMMUNITY): Payer: Self-pay | Admitting: Vascular Surgery

## 2019-07-02 LAB — COMPREHENSIVE METABOLIC PANEL
ALT: 9 U/L (ref 0–44)
AST: 16 U/L (ref 15–41)
Albumin: 1.8 g/dL — ABNORMAL LOW (ref 3.5–5.0)
Alkaline Phosphatase: 56 U/L (ref 38–126)
Anion gap: 10 (ref 5–15)
BUN: 8 mg/dL (ref 8–23)
CO2: 22 mmol/L (ref 22–32)
Calcium: 6.5 mg/dL — ABNORMAL LOW (ref 8.9–10.3)
Chloride: 104 mmol/L (ref 98–111)
Creatinine, Ser: 0.88 mg/dL (ref 0.44–1.00)
GFR calc Af Amer: >60 mL/min (ref >60)
GFR calc non Af Amer: >60 mL/min (ref >60)
Glucose, Bld: 139 mg/dL — ABNORMAL HIGH (ref 70–99)
Potassium: 3.5 mmol/L (ref 3.5–5.1)
Sodium: 136 mmol/L (ref 135–145)
Total Bilirubin: 0.8 mg/dL (ref 0.3–1.2)
Total Protein: 5 g/dL — ABNORMAL LOW (ref 6.5–8.1)

## 2019-07-02 LAB — CBC
HCT: 23 % — ABNORMAL LOW (ref 36.0–46.0)
Hemoglobin: 8.1 g/dL — ABNORMAL LOW (ref 12.0–15.0)
MCH: 31.6 pg (ref 26.0–34.0)
MCHC: 35.2 g/dL (ref 30.0–36.0)
MCV: 89.8 fL (ref 80.0–100.0)
Platelets: 188 10*3/uL (ref 150–400)
RBC: 2.56 MIL/uL — ABNORMAL LOW (ref 3.87–5.11)
RDW: 14.4 % (ref 11.5–15.5)
WBC: 11.5 10*3/uL — ABNORMAL HIGH (ref 4.0–10.5)
nRBC: 0 % (ref 0.0–0.2)

## 2019-07-02 NOTE — Progress Notes (Signed)
Rehab Admissions Coordinator Note:  Patient was screened by Cleatrice Burke for appropriateness for an Inpatient Acute Rehab Consult per PT recs.   At this time, we are recommending Inpatient Rehab consult. Please place order for consult if you would like pt considered for CIR admit.  Cleatrice Burke 07/02/2019, 6:37 PM  I can be reached at [phone].

## 2019-07-02 NOTE — Evaluation (Signed)
Physical Therapy Evaluation Patient Details Name: Wendy Townsend MRN: [id number] DOB: 16-Jan-1951 Today's Date: 07/02/2019   History of Present Illness  Pt is a 68 y/o female with pmh significant for PVD, CKD3, stroke with mild L residual weakness, admitted to [facility] with 2 days of severe foot pain.  US showed occlusion of left fem pop BPG.  Vascular surgery ultimately failed with BPGing which led to L AKA.  Clinical Impression  Pt admitted with/for L foot pain and ultimately underwent a L AKA.  Pt needs min to mod assist for basic mobility/gait. Marland Kitchen  Pt currently limited functionally due to the problems listed. ( See problems list.)   Pt will benefit from PT to maximize function and safety in order to get ready for next venue listed below.     Follow Up Recommendations CIR;Supervision/Assistance - 24 hour    Equipment Recommendations  Wheelchair (measurements PT);Wheelchair cushion (measurements PT)    Recommendations for Other Services Rehab consult     Precautions / Restrictions Precautions Precautions: Fall      Mobility  Bed Mobility Overal bed mobility: Needs Assistance Bed Mobility: Supine to Sit;Sit to Sidelying;Sit to Supine     Supine to sit: Min guard Sit to supine: Min guard Sit to sidelying: Min guard General bed mobility comments: struggles to come up due to sore L hand  Transfers Overall transfer level: Needs assistance   Transfers: Sit to/from Stand Sit to Stand: Mod assist         General transfer comment: pt deferred transfer to/from [facility] due to pain, but sit to stand into RW with light moderate assist  Ambulation/Gait                Stairs            Wheelchair Mobility    Modified Rankin (Stroke Patients Only)       Balance Overall balance assessment: Needs assistance Sitting-balance support: Single extremity supported;No upper extremity supported Sitting balance-Leahy Scale: Fair       Standing balance-Leahy Scale:  Poor Standing balance comment: cues for better hand placement                             Pertinent Vitals/Pain Pain Assessment: 0-10 Pain Score: 9  Pain Location: left stump Pain Descriptors / Indicators: Burning;Sharp;Sore Pain Intervention(s): Patient requesting pain meds-RN notified    Home Living Family/patient expects to be discharged to:: Private residence Living Arrangements: Other relatives Available Help at Discharge: Family;Available 24 hours/day Type of Home: House Home Access: Stairs to enter Entrance Stairs-Rails: Psychiatric nurse of Steps: 3 Home Layout: One level Home Equipment: Clinical cytogeneticist - 4 wheels;Cane - single point      Prior Function Level of Independence: Independent with assistive device(s)         Comments: using cane, independent with selfcare     Hand Dominance   Dominant Hand: Right    Extremity/Trunk Assessment   Upper Extremity Assessment Upper Extremity Assessment: (L hand edematous and painful, R UE WFL)    Lower Extremity Assessment Lower Extremity Assessment: RLE deficits/detail;LLE deficits/detail RLE Deficits / Details: grossly >4/5, pt able to stand with assist LLE Deficits / Details: moves against gravity.  Pt positions leg in flexion and abd. LLE Coordination: decreased fine motor       Communication   Communication: No difficulties  Cognition Arousal/Alertness: Awake/alert Behavior During Therapy: WFL for tasks assessed/performed Overall Cognitive Status: Within Functional  Limits for tasks assessed                                        General Comments General comments (skin integrity, edema, etc.): Initiated education with positioning, care, AROM to L LE.    Exercises     Assessment/Plan    PT Assessment Patient needs continued PT services  PT Problem List Decreased strength;Decreased activity tolerance;Decreased balance;Decreased mobility;Decreased  knowledge of use of DME;Decreased knowledge of precautions;Pain       PT Treatment Interventions DME instruction;Gait training;Functional mobility training;Therapeutic activities;Therapeutic exercise;Balance training;Patient/family education    PT Goals (Current goals can be found in the Care Plan section)  Acute Rehab PT Goals Patient Stated Goal: I want to be active and Independent PT Goal Formulation: With patient Time For Goal Achievement: 07/16/19 Potential to Achieve Goals: Good    Frequency Min 4X/week   Barriers to discharge        Co-evaluation               AM-PAC PT "6 Clicks" Mobility  Outcome Measure Help needed turning from your back to your side while in a flat bed without using bedrails?: A Little Help needed moving from lying on your back to sitting on the side of a flat bed without using bedrails?: A Little Help needed moving to and from a bed to a chair (including a wheelchair)?: A Lot Help needed standing up from a chair using your arms (e.g., wheelchair or bedside chair)?: A Lot Help needed to walk in hospital room?: A Lot Help needed climbing 3-5 steps with a railing? : A Lot 6 Click Score: 14    End of Session   Activity Tolerance: Patient tolerated treatment well;Patient limited by pain Patient left: in bed;with call bell/phone within reach Nurse Communication: Mobility status PT Visit Diagnosis: Other abnormalities of gait and mobility (R26.89);Pain;Difficulty in walking, not elsewhere classified (R26.2) Pain - Right/Left: Left Pain - part of body: Leg    Time: [id number] PT Time Calculation (min) (ACUTE ONLY): 22 min   Charges:   PT Evaluation $PT Eval Moderate Complexity: 1 Mod          07/02/2019  Donnella Sham, PT Acute Rehabilitation Services [phone]  (pager) [phone]  (office)  Wendy Townsend 07/02/2019, 6:23 PM

## 2019-07-02 NOTE — Progress Notes (Signed)
PROGRESS NOTE    Wendy Townsend  [id number] DOB: 1951/03/16 DOA: 06/25/2019 PCP: Sandrea Hughs, NP    Brief Narrative: 68 year old woman PMH peripheral vascular disease, CKD stage III, stroke PRESENTED to [place] [facility] the emergency department with 2 days of severe foot pain. Ultrasound showed occlusion of left femoral popliteal bypass. EDP discussed with vascular surgery with recommendation for heparin and transferred to [facility]. Not clear that surgery will be required.  Patient reports she has had scaling skin since her surgery. She has had intermittent waxing and waning pain in her foot. However approximately 2 days ago she developed intense pain in the left foot, severe, unable to ambulate on it. Improved with pain medication in the emergency department. No specific aggravating or alleviating factors. No specific associated symptoms noted.  Chart review: 1/15/2020presented to the hospital with profound ischemia of her left foot with occlusion of her left limb of her aortofemoral graft and her left femoropopliteal bypass. She was taken emergently to the operating room where she underwent attempted thrombectomy of the left limb. This was not possible. She underwent right to left femorofemoral bypass and thrombectomy of her left femoral to popliteal bypass both through the groin and through the below-knee popliteal level. She did well in the hospital and was discharged home  ED Course:Notes: Presented from home, hard time walking secondary to left leg pain. EDP noted patient presented with questionable wound infection left leg/foot, dealing with dry skin. 1 2 weeks things have been getting worse. Per EMS fever 101.2. EDP was unable to palpate DP, PT pulse. Unable to obtain dopplerable pulses DP, PT, popliteal. Femoral pulses noted.  Hospital course in the last 1 week-patient status post thrombolysis with some improvement but continued to have followed smelling drainage with  gangrene.  She had left above-knee amputation 07/01/2019.  PT and OT eval pending.  She will need SNF.   Assessment & Plan:   Principal Problem:   Left foot pain Active Problems:   Ischemic pain of foot, left   AKI (acute kidney injury) ([place])   Dehydration   CKD (chronic kidney disease), stage III   Anemia in chronic kidney disease (CKD)   #1 left lower extremity ischemia status post left below-knee popliteal artery angioplasty and thrombolysis/gangrene-patient had left above-knee amputation 07/01/2019. Patient will need long-term anticoagulation once all the surgical procedures are over to preserve the remaining grafts. Hope to DC antibiotics Vanco and aztreonam soon.  Will discuss with vascular.  #2 AKI on CKD stage IIIresolved with IV fluids continue to hold HCTZ and ACE inhibitor.   #3 hypertension blood pressure 135/71patient was hypotensive on admission which is resolved with IV hydration.With the BP trending up coreg and Catapres was restarted.  Blood pressure now stable.Continue to hold HCTZ And zestril.  #4 history of stroke with mild left-sided weakness on aspirin at home. Continue aspirin.    #5 history of tobacco use and substance abuse counseled.  #6Acute blood loss anemia hemoglobindropped to 5.5 on 06/28/2019 she received 2 units of blood transfusion her hemoglobinis up to 11.2.  Follow-up labs today.  #7 hyponatremiaresolvedwith normal saline patient was dry and dehydrated at the time of admission in addition to being on hydrochlorothiazide and ACE.  #8 hypokalemiarepleted #9 hypomagnesemia repleted  #10leukocytosisresolving due to #1  resolving    Estimated body mass index is 25.23 kg/m as calculated from the following:   Height as of this encounter: 5\' 4"  (1.626 m).   Weight as of this encounter: 66.7 kg.  DVT prophylaxis:scdpatient was on IV heparin this was stopped due to acute drop in hemoglobin code Status:Full code   family Communication:No family available Disposition Plan:Pending clinical improvement Consultants:  Vascular surgery  Procedures:Thrombolysis 06/26/2019 Left below-knee popliteal artery angioplasty 06/27/2019 Antimicrobials:vanco and aztreonam   Subjective: Patient resting in bed asking for something for pain left stump covered with stump dressing in place  Objective: Vitals:   07/01/19 1700 07/01/19 2103 07/02/19 0600 07/02/19 0840  BP:  127/81 135/71   Pulse:  90 94   Resp: (!) 22 16 20  (!) 9  Temp:  98.4 F (36.9 C) 98.2 F (36.8 C)   TempSrc:  Oral Oral   SpO2:  100% 100%   Weight:      Height:        Intake/Output Summary (Last 24 hours) at 07/02/2019 1147 Last data filed at 07/01/2019 1522 Gross per 24 hour  Intake 997.17 ml  Output 300 ml  Net 697.17 ml   Filed Weights   06/25/19 1826  Weight: 66.7 kg    Examination:  General exam: Appears calm and comfortable  Respiratory system: Clear to auscultation. Respiratory effort normal. Cardiovascular system: S1 & S2 heard, RRR. No JVD, murmurs, rubs, gallops or clicks. No pedal edema. Gastrointestinal system: Abdomen is nondistended, soft and nontender. No organomegaly or masses felt. Normal bowel sounds heard. Central nervous system: Alert and oriented. No focal neurological deficits. Extremities: Left above-the-knee amputation stump covered with dressings  Skin: No rashes, lesions or ulcers Psychiatry: Judgement and insight appear normal. Mood & affect appropriate.     Data Reviewed: I have personally reviewed following labs and imaging studies  CBC: Recent Labs  Lab 06/25/19 1434  06/27/19 1025 06/28/19 0108 06/28/19 0347 06/28/19 1600 06/29/19 0251 06/30/19 0510  WBC 17.5*   < > 18.1* 24.5* 22.7*  --  17.1* 13.4*  NEUTROABS 14.1*  --   --  20.9*  --   --   --   --   HGB 9.6*   < > 7.6* 5.8* 5.5* 9.7* 7.9* 11.2*  HCT 29.2*   < > 21.8* 16.9* 16.3* 26.9* 21.9* 31.9*  MCV 93.0   < > 90.5  91.8 93.1  --  86.6 88.6  PLT 377   < > 270 218 230  --  161 169   < > = values in this interval not displayed.   Basic Metabolic Panel: Recent Labs  Lab 06/26/19 0514  06/27/19 0544 06/27/19 1025 06/28/19 0108 06/28/19 0347 06/28/19 1600 06/29/19 0251 06/30/19 0510 07/01/19 0458  NA 134*  --  140  --  137  --   --   --  138 138  K 3.3*  --  3.3*  --  3.4*  --   --   --  3.4* 3.5  CL 101  --  109  --  109  --   --   --  110 107  CO2 20*  --  19*  --  18*  --   --   --  18* 22  GLUCOSE 123*  --  112*  --  145*  --   --   --  99 94  BUN 41*  --  24*  --  25*  --   --   --  14 11  CREATININE 2.03*   < > 1.24*  --  1.32* 1.32*  --  0.99 1.05* 1.02*  CALCIUM 8.1*  --  7.9*  --  7.2*  --   --   --  7.4* 7.2*  MG  --   --   --  1.0* 0.9*  --  2.5* 1.9  --   --    < > = values in this interval not displayed.   GFR: Estimated Creatinine Clearance: 50.3 mL/min (A) (by C-G formula based on SCr of 1.02 mg/dL (H)). Liver Function Tests: Recent Labs  Lab 06/25/19 1434 06/28/19 0108 06/30/19 0510 07/01/19 0458  AST 27 16 29 22   ALT 12 10 12 12   ALKPHOS 97 69 70 75  BILITOT 1.5* 0.9 1.0 0.8  PROT 7.9 5.2* 5.3* 5.3*  ALBUMIN 3.5 1.9* 1.9* 1.9*   No results for input(s): LIPASE, AMYLASE in the last 168 hours. No results for input(s): AMMONIA in the last 168 hours. Coagulation Profile: Recent Labs  Lab 06/26/19 0514  INR 1.2   Cardiac Enzymes: No results for input(s): CKTOTAL, CKMB, CKMBINDEX, TROPONINI in the last 168 hours. BNP (last 3 results) No results for input(s): PROBNP in the last 8760 hours. HbA1C: No results for input(s): HGBA1C in the last 72 hours. CBG: Recent Labs  Lab 06/26/19 2132 06/27/19 0039 06/27/19 0425 06/27/19 0819  GLUCAP 116* 109* 100* 100*   Lipid Profile: No results for input(s): CHOL, HDL, LDLCALC, TRIG, CHOLHDL, LDLDIRECT in the last 72 hours. Thyroid Function Tests: No results for input(s): TSH, T4TOTAL, FREET4, T3FREE, THYROIDAB in the  last 72 hours. Anemia Panel: No results for input(s): VITAMINB12, FOLATE, FERRITIN, TIBC, IRON, RETICCTPCT in the last 72 hours. Sepsis Labs: Recent Labs  Lab 06/25/19 1434 06/25/19 1642  LATICACIDVEN 1.7 1.6    Recent Results (from the past 240 hour(s))  SARS Coronavirus 2 [facility] order, Performed in [facility] hospital lab) Nasopharyngeal Nasopharyngeal Swab     Status: None   Collection Time: 06/25/19  2:57 PM   Specimen: Nasopharyngeal Swab  Result Value Ref Range Status   SARS Coronavirus 2 NEGATIVE NEGATIVE Final    Comment: (NOTE) If result is NEGATIVE SARS-CoV-2 target nucleic acids are NOT DETECTED. The SARS-CoV-2 RNA is generally detectable in upper and lower  respiratory specimens during the acute phase of infection. The lowest  concentration of SARS-CoV-2 viral copies this assay can detect is 250  copies / mL. A negative result does not preclude SARS-CoV-2 infection  and should not be used as the sole basis for treatment or other  patient management decisions.  A negative result may occur with  improper specimen collection / handling, submission of specimen other  than nasopharyngeal swab, presence of viral mutation(s) within the  areas targeted by this assay, and inadequate number of viral copies  (<250 copies / mL). A negative result must be combined with clinical  observations, patient history, and epidemiological information. If result is POSITIVE SARS-CoV-2 target nucleic acids are DETECTED. The SARS-CoV-2 RNA is generally detectable in upper and lower  respiratory specimens dur ing the acute phase of infection.  Positive  results are indicative of active infection with SARS-CoV-2.  Clinical  correlation with patient history and other diagnostic information is  necessary to determine patient infection status.  Positive results do  not rule out bacterial infection or co-infection with other viruses. If result is PRESUMPTIVE POSTIVE SARS-CoV-2 nucleic acids  MAY BE PRESENT.   A presumptive positive result was obtained on the submitted specimen  and confirmed on repeat testing.  While 2019 novel coronavirus  (SARS-CoV-2) nucleic acids may be present in the submitted sample  additional confirmatory testing may be necessary for epidemiological  and / or  clinical management purposes  to differentiate between  SARS-CoV-2 and other Sarbecovirus currently known to infect humans.  If clinically indicated additional testing with an alternate test  methodology [phone]) is advised. The SARS-CoV-2 RNA is generally  detectable in upper and lower respiratory sp ecimens during the acute  phase of infection. The expected result is Negative. Fact Sheet for Patients:  StrictlyIdeas.no Fact Sheet for Healthcare Providers: BankingDealers.co.za This test is not yet approved or cleared by the [place] FDA and has been authorized for detection and/or diagnosis of SARS-CoV-2 by FDA under an Emergency Use Authorization (EUA).  This EUA will remain in effect (meaning this test can be used) for the duration of the COVID-19 declaration under Section 564(b)(1) of the Act, 21 U.S.C. section 360bbb-3(b)(1), unless the authorization is terminated or revoked sooner. Performed at [facility], [place] [address], [place], [place] [address]   MRSA PCR Screening     Status: None   Collection Time: 06/28/19  4:29 PM   Specimen: Nasal Mucosa; Nasopharyngeal  Result Value Ref Range Status   MRSA by PCR NEGATIVE NEGATIVE Final    Comment:        The GeneXpert MRSA Assay (FDA approved for NASAL specimens only), is one component of a comprehensive MRSA colonization surveillance program. It is not intended to diagnose MRSA infection nor to guide or monitor treatment for MRSA infections. Performed at [facility], [place] [address], [place], [place] [address]   Urine Culture     Status: None    Collection Time: 06/30/19 12:15 AM   Specimen: Urine, Random  Result Value Ref Range Status   Specimen Description URINE, RANDOM  Final   Special Requests NONE  Final   Culture   Final    NO GROWTH Performed at [facility], [place] [address] [place], [place] [address]    Report Status 07/01/2019 FINAL  Final  Surgical pcr screen     Status: Abnormal   Collection Time: 07/01/19  1:05 AM   Specimen: Nasal Mucosa; Nasal Swab  Result Value Ref Range Status   MRSA, PCR NEGATIVE NEGATIVE Final   Staphylococcus aureus POSITIVE (A) NEGATIVE Final    Comment: (NOTE) The Xpert SA Assay (FDA approved for NASAL specimens in patients 67 years of age and older), is one component of a comprehensive surveillance program. It is not intended to diagnose infection nor to guide or monitor treatment. Performed at [facility], [place] [address], [place],  [address]          Radiology Studies: No results found.      Scheduled Meds: . aspirin EC  81 mg Oral Daily  . carvedilol  3.125 mg Oral BID WC  . Chlorhexidine Gluconate Cloth  6 each Topical Daily  . Chlorhexidine Gluconate Cloth  6 each Topical Daily  . mupirocin ointment  1 application Nasal BID  . saccharomyces boulardii  250 mg Oral BID  . sodium chloride flush  3 mL Intravenous Q12H  . sodium chloride flush  3 mL Intravenous Q12H   Continuous Infusions: . sodium chloride    . aztreonam 1 g (07/02/19 0655)  . vancomycin 750 mg (07/02/19 1121)     LOS: 7 days     Georgette Shell, MD [facility]  If 7PM-7AM, please contact night-coverage [email or web address] Password [email or web address] 07/02/2019, 11:47 AM

## 2019-07-02 NOTE — Progress Notes (Addendum)
  Progress Note    07/02/2019 7:35 AM 1 Day Post-Op  Subjective:  No complaints of pain.  Says pain is better than before surgery  Afebrile HR 80's-90's NSR  Vitals:   07/01/19 2103 07/02/19 0600  BP: 127/81 135/71  Pulse: 90 94  Resp: 16 20  Temp: 98.4 F (36.9 C) 98.2 F (36.8 C)  SpO2: 100% 100%    Physical Exam: Incisions:  Bandage is clean and intact.    CBC    Component Value Date/Time   WBC 13.4 (H) 06/30/2019 0510   RBC 3.60 (L) 06/30/2019 0510   HGB 11.2 (L) 06/30/2019 0510   HCT 31.9 (L) 06/30/2019 0510   PLT 169 06/30/2019 0510   MCV 88.6 06/30/2019 0510   MCH 31.1 06/30/2019 0510   MCHC 35.1 06/30/2019 0510   RDW 14.7 06/30/2019 0510   LYMPHSABS 1.6 06/28/2019 0108   MONOABS 1.8 (H) 06/28/2019 0108   EOSABS 0.0 06/28/2019 0108   BASOSABS 0.0 06/28/2019 0108    BMET    Component Value Date/Time   NA 138 07/01/2019 0458   K 3.5 07/01/2019 0458   CL 107 07/01/2019 0458   CO2 22 07/01/2019 0458   GLUCOSE 94 07/01/2019 0458   BUN 11 07/01/2019 0458   CREATININE 1.02 (H) 07/01/2019 0458   CREATININE 2.28 (H) 06/03/2019 0927   CALCIUM 7.2 (L) 07/01/2019 0458   GFRNONAA 57 (L) 07/01/2019 0458   GFRNONAA 22 (L) 06/03/2019 0927   GFRAA >60 07/01/2019 0458   GFRAA 25 (L) 06/03/2019 0927    INR    Component Value Date/Time   INR 1.2 06/26/2019 0514     Intake/Output Summary (Last 24 hours) at 07/02/2019 0735 Last data filed at 07/01/2019 1522 Gross per 24 hour  Intake 997.17 ml  Output 300 ml  Net 697.17 ml     Assessment/Plan:  68 y.o. female is s/p left above knee amputation  1 Day Post-Op  -pt pain is well controlled and dressing is clean. -will take down dressing tomorrow -will need PT/OT eval -will stop vanc tomorrow   Leontine Locket, PA-C [facility] [phone] 07/02/2019 7:35 AM    I have examined the patient, reviewed and agree with above.  Curt Jews, MD 07/02/2019 7:49 AM

## 2019-07-03 LAB — CBC
HCT: 22.9 % — ABNORMAL LOW (ref 36.0–46.0)
Hemoglobin: 7.8 g/dL — ABNORMAL LOW (ref 12.0–15.0)
MCH: 30.5 pg (ref 26.0–34.0)
MCHC: 34.1 g/dL (ref 30.0–36.0)
MCV: 89.5 fL (ref 80.0–100.0)
Platelets: 229 10*3/uL (ref 150–400)
RBC: 2.56 MIL/uL — ABNORMAL LOW (ref 3.87–5.11)
RDW: 14.2 % (ref 11.5–15.5)
WBC: 11.4 10*3/uL — ABNORMAL HIGH (ref 4.0–10.5)
nRBC: 0 % (ref 0.0–0.2)

## 2019-07-03 LAB — COMPREHENSIVE METABOLIC PANEL
ALT: 9 U/L (ref 0–44)
AST: 16 U/L (ref 15–41)
Albumin: 1.9 g/dL — ABNORMAL LOW (ref 3.5–5.0)
Alkaline Phosphatase: 61 U/L (ref 38–126)
Anion gap: 8 (ref 5–15)
BUN: 6 mg/dL — ABNORMAL LOW (ref 8–23)
CO2: 22 mmol/L (ref 22–32)
Calcium: 6.3 mg/dL — CL (ref 8.9–10.3)
Chloride: 107 mmol/L (ref 98–111)
Creatinine, Ser: 0.84 mg/dL (ref 0.44–1.00)
GFR calc Af Amer: >60 mL/min (ref >60)
GFR calc non Af Amer: >60 mL/min (ref >60)
Glucose, Bld: 103 mg/dL — ABNORMAL HIGH (ref 70–99)
Potassium: 3.6 mmol/L (ref 3.5–5.1)
Sodium: 137 mmol/L (ref 135–145)
Total Bilirubin: 1 mg/dL (ref 0.3–1.2)
Total Protein: 4.9 g/dL — ABNORMAL LOW (ref 6.5–8.1)

## 2019-07-03 MED ORDER — CALCIUM GLUCONATE-NACL 2-0.675 GM/100ML-% IV SOLN
2.0000 g | Freq: Once | INTRAVENOUS | Status: AC
  Administered 2019-07-03: 2000 mg via INTRAVENOUS
  Filled 2019-07-03: qty 100

## 2019-07-03 NOTE — Progress Notes (Signed)
PROGRESS NOTE    Wendy Townsend  [id number] DOB: 1951-06-28 DOA: 06/25/2019 PCP: Sandrea Hughs, NP   Brief Narrative: 68 year old woman PMH peripheral vascular disease, CKD stage III, stroke PRESENTED to [place] long the emergency department with 2 days of severe foot pain. Ultrasound showed occlusion of left femoral popliteal bypass.  Patient failed thrombolysis and ended up having left above-knee amputation on 07/01/2019.    Assessment & Plan:   Principal Problem:   Left foot pain Active Problems:   Ischemic pain of foot, left   AKI (acute kidney injury) ([facility])   Dehydration   CKD (chronic kidney disease), stage III   Anemia in chronic kidney disease (CKD)  #1 left lower extremity gangrene status post left AKA 07/01/2019.  Patient was treated with Vanco and aztreonam this was stopped 07/03/2019.  By physical therapy recommends CIR CIR consulted today.  #2 AKI on CKD stage IIIresolved with IV fluids continue to hold HCTZ and ACE inhibitor.   #3 hypertension blood pressure 135/71patient was hypotensive on admission which is resolved with IV hydration.With the BP trending up coregand Catapres was restarted. Blood pressure now stable.Continue to hold HCTZ And zestril.  #4 history of stroke with mild left-sided weakness on aspirin at home. Continue aspirin.    #5 history of tobacco use and substance abuse counseled.  #6Acute blood loss anemia hemoglobindropped to 5.5 on 06/28/2019 after the surgery.  Hemoglobin today is 7.8.    #7 hyponatremiaresolvedwith normal saline patient was dry and dehydrated at the time of admission in addition to being on hydrochlorothiazide and ACE.  #8 hypokalemiarepleted #9 hypomagnesemiarepleted  #10 hypocalcemia with hypoalbuminemia-replete corrected calcium is 7.9.   Estimated body mass index is 25.23 kg/m as calculated from the following:   Height as of this encounter: 5\' 4"  (1.626 m).   Weight as of this  encounter: 66.7 kg.  DVT prophylaxis:scdpatient was on IV heparin this was stopped due to acute drop in hemoglobin code Status:Full code  family Communication:No family available Disposition Plan:Waiting for CIR to evaluate the patient consult placed 07/03/2019  consultants:  Vascular surgery  Procedures:Thrombolysis 06/26/2019 Left below-knee popliteal artery angioplasty 06/27/2019 Left above-knee amputation 07/01/2019 Antimicrobials:vanco and aztreonam stopped 07/03/2019 Subjective:  Patient resting in bed complains of pain to the stump stump looks clean dry intact staples in place Objective: Vitals:   07/02/19 1654 07/02/19 2010 07/03/19 0507 07/03/19 0853  BP: (!) 142/94 131/90 139/78 132/77  Pulse: 99 98 (!) 113 (!) 101  Resp: (!) 21 14 20 14   Temp: 98.4 F (36.9 C) 98.1 F (36.7 C) 97.9 F (36.6 C)   TempSrc: Oral  Oral   SpO2: 98% 100% 99%   Weight:      Height:        Intake/Output Summary (Last 24 hours) at 07/03/2019 1105 Last data filed at 07/03/2019 0508 Gross per 24 hour  Intake -  Output 950 ml  Net -950 ml   Filed Weights   06/25/19 1826  Weight: 66.7 kg    Examination:  General exam: Appears calm and comfortable  Respiratory system: Clear to auscultation. Respiratory effort normal. Cardiovascular system: S1 & S2 heard, RRR. No JVD, murmurs, rubs, gallops or clicks. No pedal edema. Gastrointestinal system: Abdomen is nondistended, soft and nontender. No organomegaly or masses felt. Normal bowel sounds heard. Central nervous system: Alert and oriented. No focal neurological deficits. Extremities: Left lower extremity stump staples and clean dry intact  skin: No rashes, lesions or ulcers Psychiatry: Judgement and insight appear  normal. Mood & affect appropriate.     Data Reviewed: I have personally reviewed following labs and imaging studies  CBC: Recent Labs  Lab 06/28/19 0108 06/28/19 0347 06/28/19 1600 06/29/19 0251 06/30/19  0510 07/02/19 1631 07/03/19 0445  WBC 24.5* 22.7*  --  17.1* 13.4* 11.5* 11.4*  NEUTROABS 20.9*  --   --   --   --   --   --   HGB 5.8* 5.5* 9.7* 7.9* 11.2* 8.1* 7.8*  HCT 16.9* 16.3* 26.9* 21.9* 31.9* 23.0* 22.9*  MCV 91.8 93.1  --  86.6 88.6 89.8 89.5  PLT 218 230  --  161 169 188 [id number]   Basic Metabolic Panel: Recent Labs  Lab 06/27/19 1025 06/28/19 0108  06/28/19 1600 06/29/19 0251 06/30/19 0510 07/01/19 0458 07/02/19 1631 07/03/19 0445  NA  --  137  --   --   --  138 138 136 137  K  --  3.4*  --   --   --  3.4* 3.5 3.5 3.6  CL  --  109  --   --   --  110 107 104 107  CO2  --  18*  --   --   --  18* 22 22 22   GLUCOSE  --  145*  --   --   --  99 94 139* 103*  BUN  --  25*  --   --   --  14 11 8  6*  CREATININE  --  1.32*   < >  --  0.99 1.05* 1.02* 0.88 0.84  CALCIUM  --  7.2*  --   --   --  7.4* 7.2* 6.5* 6.3*  MG 1.0* 0.9*  --  2.5* 1.9  --   --   --   --    < > = values in this interval not displayed.   GFR: Estimated Creatinine Clearance: 61 mL/min (by C-G formula based on SCr of 0.84 mg/dL). Liver Function Tests: Recent Labs  Lab 06/28/19 0108 06/30/19 0510 07/01/19 0458 07/02/19 1631 07/03/19 0445  AST 16 29 22 16 16   ALT 10 12 12 9 9   ALKPHOS 69 70 75 56 61  BILITOT 0.9 1.0 0.8 0.8 1.0  PROT 5.2* 5.3* 5.3* 5.0* 4.9*  ALBUMIN 1.9* 1.9* 1.9* 1.8* 1.9*   No results for input(s): LIPASE, AMYLASE in the last 168 hours. No results for input(s): AMMONIA in the last 168 hours. Coagulation Profile: No results for input(s): INR, PROTIME in the last 168 hours. Cardiac Enzymes: No results for input(s): CKTOTAL, CKMB, CKMBINDEX, TROPONINI in the last 168 hours. BNP (last 3 results) No results for input(s): PROBNP in the last 8760 hours. HbA1C: No results for input(s): HGBA1C in the last 72 hours. CBG: Recent Labs  Lab 06/26/19 2132 06/27/19 0039 06/27/19 0425 06/27/19 0819  GLUCAP 116* 109* 100* 100*   Lipid Profile: No results for input(s): CHOL, HDL,  LDLCALC, TRIG, CHOLHDL, LDLDIRECT in the last 72 hours. Thyroid Function Tests: No results for input(s): TSH, T4TOTAL, FREET4, T3FREE, THYROIDAB in the last 72 hours. Anemia Panel: No results for input(s): VITAMINB12, FOLATE, FERRITIN, TIBC, IRON, RETICCTPCT in the last 72 hours. Sepsis Labs: No results for input(s): PROCALCITON, LATICACIDVEN in the last 168 hours.  Recent Results (from the past 240 hour(s))  SARS Coronavirus 2 [facility] order, Performed in [facility] hospital lab) Nasopharyngeal Nasopharyngeal Swab     Status: None   Collection Time: 06/25/19  2:57 PM   Specimen:  Nasopharyngeal Swab  Result Value Ref Range Status   SARS Coronavirus 2 NEGATIVE NEGATIVE Final    Comment: (NOTE) If result is NEGATIVE SARS-CoV-2 target nucleic acids are NOT DETECTED. The SARS-CoV-2 RNA is generally detectable in upper and lower  respiratory specimens during the acute phase of infection. The lowest  concentration of SARS-CoV-2 viral copies this assay can detect is 250  copies / mL. A negative result does not preclude SARS-CoV-2 infection  and should not be used as the sole basis for treatment or other  patient management decisions.  A negative result may occur with  improper specimen collection / handling, submission of specimen other  than nasopharyngeal swab, presence of viral mutation(s) within the  areas targeted by this assay, and inadequate number of viral copies  (<250 copies / mL). A negative result must be combined with clinical  observations, patient history, and epidemiological information. If result is POSITIVE SARS-CoV-2 target nucleic acids are DETECTED. The SARS-CoV-2 RNA is generally detectable in upper and lower  respiratory specimens dur ing the acute phase of infection.  Positive  results are indicative of active infection with SARS-CoV-2.  Clinical  correlation with patient history and other diagnostic information is  necessary to determine patient infection  status.  Positive results do  not rule out bacterial infection or co-infection with other viruses. If result is PRESUMPTIVE POSTIVE SARS-CoV-2 nucleic acids MAY BE PRESENT.   A presumptive positive result was obtained on the submitted specimen  and confirmed on repeat testing.  While 2019 novel coronavirus  (SARS-CoV-2) nucleic acids may be present in the submitted sample  additional confirmatory testing may be necessary for epidemiological  and / or clinical management purposes  to differentiate between  SARS-CoV-2 and other Sarbecovirus currently known to infect humans.  If clinically indicated additional testing with an alternate test  methodology [phone]) is advised. The SARS-CoV-2 RNA is generally  detectable in upper and lower respiratory sp ecimens during the acute  phase of infection. The expected result is Negative. Fact Sheet for Patients:  StrictlyIdeas.no Fact Sheet for Healthcare Providers: BankingDealers.co.za This test is not yet approved or cleared by the [place] FDA and has been authorized for detection and/or diagnosis of SARS-CoV-2 by FDA under an Emergency Use Authorization (EUA).  This EUA will remain in effect (meaning this test can be used) for the duration of the COVID-19 declaration under Section 564(b)(1) of the Act, 21 U.S.C. section 360bbb-3(b)(1), unless the authorization is terminated or revoked sooner. Performed at [facility], [place] [address], [place], [place] [address]   MRSA PCR Screening     Status: None   Collection Time: 06/28/19  4:29 PM   Specimen: Nasal Mucosa; Nasopharyngeal  Result Value Ref Range Status   MRSA by PCR NEGATIVE NEGATIVE Final    Comment:        The GeneXpert MRSA Assay (FDA approved for NASAL specimens only), is one component of a comprehensive MRSA colonization surveillance program. It is not intended to diagnose MRSA infection nor to guide or  monitor treatment for MRSA infections. Performed at [facility], [place] [address], [place],  [address]   Urine Culture     Status: None   Collection Time: 06/30/19 12:15 AM   Specimen: Urine, Random  Result Value Ref Range Status   Specimen Description URINE, RANDOM  Final   Special Requests NONE  Final   Culture   Final    NO GROWTH Performed at [facility]  Lab, [address], [place], [place] [address]    Report Status 07/01/2019 FINAL  Final  Surgical pcr screen     Status: Abnormal   Collection Time: 07/01/19  1:05 AM   Specimen: Nasal Mucosa; Nasal Swab  Result Value Ref Range Status   MRSA, PCR NEGATIVE NEGATIVE Final   Staphylococcus aureus POSITIVE (A) NEGATIVE Final    Comment: (NOTE) The Xpert SA Assay (FDA approved for NASAL specimens in patients 5 years of age and older), is one component of a comprehensive surveillance program. It is not intended to diagnose infection nor to guide or monitor treatment. Performed at [facility], [place] [address] [place], [place] [address]          Radiology Studies: No results found.      Scheduled Meds: . aspirin EC  81 mg Oral Daily  . carvedilol  3.125 mg Oral BID WC  . Chlorhexidine Gluconate Cloth  6 each Topical Daily  . Chlorhexidine Gluconate Cloth  6 each Topical Daily  . mupirocin ointment  1 application Nasal BID  . saccharomyces boulardii  250 mg Oral BID  . sodium chloride flush  3 mL Intravenous Q12H  . sodium chloride flush  3 mL Intravenous Q12H   Continuous Infusions: . sodium chloride    . aztreonam 1 g (07/03/19 0510)  . calcium gluconate 2,000 mg (07/03/19 1059)  . vancomycin 750 mg (07/03/19 1101)     LOS: 8 days     Georgette Shell, MD [facility]  If 7PM-7AM, please contact night-coverage [email or web address] Password [email or web address] 07/03/2019, 11:05 AM

## 2019-07-03 NOTE — Consult Note (Signed)
Inpatient Rehab Admissions:  Inpatient Rehab Consult received.  I met with pt at the bedside for rehabilitation assessment. Pt appears to be a great candidate for CIR. Pt very interested in the program and would like to proceed if her insurance approves. I will confirm caregiver support and begin insurance authorization process for possible admit.   Please call if questions.   Jhonnie Garner, OTR/L  Rehab Admissions Coordinator  [phone] 07/03/2019 2:49 PM

## 2019-07-03 NOTE — Progress Notes (Signed)
Physical Therapy Treatment Patient Details Name: Wendy Townsend MRN: [id number] DOB: 03/11/1951 Today's Date: 07/03/2019    History of Present Illness Pt is a 68 y/o female with pmh significant for PVD, CKD3, stroke with mild L residual weakness, admitted to [facility] with 2 days of severe foot pain.  US showed occlusion of left fem pop BPG.  Vascular surgery ultimately failed with BPGing which led to L AKA.    PT Comments    Pt had just gotten back to bed but very agreeable to getting up with PT.  Pt did sit to stands with RW and sitting and laying exercises.  Focus on right quad strenghtening.  Pt did great - great attitude.  I agree with CIR as her next plan for PT.  PT will continue to follow her on acute - will walk next time with chair following.   Follow Up Recommendations  CIR;Supervision/Assistance - 24 hour     Equipment Recommendations  Wheelchair (measurements PT);Wheelchair cushion (measurements PT)    Recommendations for Other Services Rehab consult     Precautions / Restrictions Precautions Precautions: Fall Precaution Comments: left AKA - staples intact    Mobility  Bed Mobility Overal bed mobility: Needs Assistance Bed Mobility: Sidelying to Sit   Sidelying to sit: Mod assist;HOB elevated       General bed mobility comments: pt has IV in hand and long fingernails - hard to use hands to push on bed - needed mod assist to lift trunk and scoot to EOB  Transfers Overall transfer level: Needs assistance Equipment used: Rolling walker (2 wheeled) Transfers: Sit to/from Stand Sit to Stand: Min assist         General transfer comment: pt had just gotten back to bed. she agreed to PT session - she did 5 sit to stands from elevated bed with hands on RW - had to take rest breaks between - did well  Ambulation/Gait             General Gait Details: pt tried moving foot to move up higher on the bed in standing - she does more of a wiggle to move her  foot   Stairs             Wheelchair Mobility    Modified Rankin (Stroke Patients Only)       Balance     Sitting balance-Leahy Scale: Good       Standing balance-Leahy Scale: Fair Standing balance comment: standing with RW only                            Cognition Arousal/Alertness: Awake/alert Behavior During Therapy: WFL for tasks assessed/performed Overall Cognitive Status: Within Functional Limits for tasks assessed                                 General Comments: pt very cooperative - good outlook.  pt tried hard with me.  incision looks good - no drainage      Exercises Other Exercises Other Exercises: Pt sitting - did right leg long arc quads - cues to get leg all the way straight x 10 Other Exercises: 5 sit to stands from elevated bed - with hands on RW Other Exercises: supine - bridging x 10 with right leg.  Pt also did SLR x 10 with right leg    General Comments  Pertinent Vitals/Pain Pain Assessment: 0-10 Pain Score: 4  Pain Location: left stump Pain Descriptors / Indicators: Guarding;Sore;Discomfort Pain Intervention(s): Limited activity within patient's tolerance;Repositioned;Monitored during session    Home Living                      Prior Function            PT Goals (current goals can now be found in the care plan section) Progress towards PT goals: Progressing toward goals    Frequency    Min 4X/week      PT Plan Current plan remains appropriate    Co-evaluation              AM-PAC PT "6 Clicks" Mobility   Outcome Measure  Help needed turning from your back to your side while in a flat bed without using bedrails?: A Lot Help needed moving from lying on your back to sitting on the side of a flat bed without using bedrails?: A Lot Help needed moving to and from a bed to a chair (including a wheelchair)?: A Lot Help needed standing up from a chair using your arms (e.g.,  wheelchair or bedside chair)?: A Lot Help needed to walk in hospital room?: A Lot Help needed climbing 3-5 steps with a railing? : Total 6 Click Score: 11    End of Session Equipment Utilized During Treatment: Gait belt Activity Tolerance: Patient tolerated treatment well Patient left: in bed;with call bell/phone within reach Nurse Communication: Mobility status PT Visit Diagnosis: Other abnormalities of gait and mobility (R26.89);Pain;Difficulty in walking, not elsewhere classified (R26.2) Pain - Right/Left: Left Pain - part of body: Leg     Time: 1350-1415 PT Time Calculation (min) (ACUTE ONLY): 25 min  Charges:  $Therapeutic Exercise: 8-22 mins $Therapeutic Activity: 8-22 mins                     07/03/2019   Rande Lawman, PT    Loyal Buba 07/03/2019, 2:25 PM

## 2019-07-03 NOTE — Progress Notes (Addendum)
  Progress Note    07/03/2019 7:47 AM 2 Days Post-Op  Subjective:  Says she is having some pain in her leg  Afebrile  Vitals:   07/02/19 2010 07/03/19 0507  BP: 131/90 139/78  Pulse: 98 (!) 113  Resp: 14 20  Temp: 98.1 F (36.7 C) 97.9 F (36.6 C)  SpO2: 100% 99%    Physical Exam: Incisions:  Dressing removed and incision is clean with staples in tact.  Mild ecchymosis lateral & anteriorly      CBC    Component Value Date/Time   WBC 11.4 (H) 07/03/2019 0445   RBC 2.56 (L) 07/03/2019 0445   HGB 7.8 (L) 07/03/2019 0445   HCT 22.9 (L) 07/03/2019 0445   PLT 229 07/03/2019 0445   MCV 89.5 07/03/2019 0445   MCH 30.5 07/03/2019 0445   MCHC 34.1 07/03/2019 0445   RDW 14.2 07/03/2019 0445   LYMPHSABS 1.6 06/28/2019 0108   MONOABS 1.8 (H) 06/28/2019 0108   EOSABS 0.0 06/28/2019 0108   BASOSABS 0.0 06/28/2019 0108    BMET    Component Value Date/Time   NA 137 07/03/2019 0445   K 3.6 07/03/2019 0445   CL 107 07/03/2019 0445   CO2 22 07/03/2019 0445   GLUCOSE 103 (H) 07/03/2019 0445   BUN 6 (L) 07/03/2019 0445   CREATININE 0.84 07/03/2019 0445   CREATININE 2.28 (H) 06/03/2019 0927   CALCIUM 6.3 (LL) 07/03/2019 0445   GFRNONAA >60 07/03/2019 0445   GFRNONAA 22 (L) 06/03/2019 0927   GFRAA >60 07/03/2019 0445   GFRAA 25 (L) 06/03/2019 0927    INR    Component Value Date/Time   INR 1.2 06/26/2019 0514     Intake/Output Summary (Last 24 hours) at 07/03/2019 0747 Last data filed at 07/03/2019 [id number] Gross per 24 hour  Intake -  Output 950 ml  Net -950 ml     Assessment/Plan:  68 y.o. female is s/p left above knee amputation  2 Days Post-Op  -dressing removed and incision looks good with staples in tact.  -most likely does not need long term anticoagulation, but will d/w Dr. Donnetta Hutching. -hopeful for CIR today or tomorrow   Leontine Locket, PA-C [facility] [phone] 07/03/2019 7:47 AM

## 2019-07-03 NOTE — Evaluation (Signed)
Occupational Therapy Evaluation Patient Details Name: Wendy Townsend MRN: [id number] DOB: 1951/02/19 Today's Date: 07/03/2019    History of Present Illness Pt is a 68 y/o female with pmh significant for PVD, CKD3, stroke with mild L residual weakness, admitted to [facility] with 2 days of severe foot pain.  US showed occlusion of left fem pop BPG.  Vascular surgery ultimately failed with BPGing which led to L AKA.   Clinical Impression   Pt admitted with the above diagnoses and presents with below problem list. Pt will benefit from continued acute OT to address the below listed deficits and maximize independence with basic ADLs prior to d/c to venue below. PTA pt was mod I with ADLs. Pt is currently mod - max A with LB ADLs, mod A with bed mobility and toilet transfers to [facility], min A with UB ADLs. Recommend intensive rehab program at d/c.      Follow Up Recommendations  CIR    Equipment Recommendations  Other (comment)(defer to next venue)    Recommendations for Other Services       Precautions / Restrictions Precautions Precautions: Fall Restrictions Weight Bearing Restrictions: Yes      Mobility Bed Mobility Overal bed mobility: Needs Assistance Bed Mobility: Sidelying to Sit   Sidelying to sit: Mod assist;HOB elevated       General bed mobility comments: struggles to come up due to sore L hand. Cues for sequencing. Assist to fully pivot hips to EOB position, utilized bed pad for this.   Transfers Overall transfer level: Needs assistance Equipment used: Rolling walker (2 wheeled) Transfers: Sit to/from [facility] Sit to Stand: Mod assist;From elevated surface Stand pivot transfers: Mod assist;From elevated surface       General transfer comment: EOB to recliner. Cues for sequencing and technique with rw. Assist to powerup, steady, and control descent    Balance Overall balance assessment: Needs assistance Sitting-balance support: Single extremity  supported;No upper extremity supported Sitting balance-Leahy Scale: Fair       Standing balance-Leahy Scale: Poor                             ADL either performed or assessed with clinical judgement   ADL Overall ADL's : Needs assistance/impaired Eating/Feeding: Set up;Sitting   Grooming: Set up;Sitting   Upper Body Bathing: Minimal assistance;Sitting   Lower Body Bathing: Maximal assistance;Sit to/from stand   Upper Body Dressing : Minimal assistance;Sitting   Lower Body Dressing: Maximal assistance;Sit to/from stand   Toilet Transfer: Moderate assistance;Stand-pivot;BSC;RW   Toileting- Clothing Manipulation and Hygiene: Moderate assistance;Sit to/from stand;Maximal assistance   Tub/ Shower Transfer: Walk-in shower;Moderate assistance;Stand-pivot;3 in 1;Rolling walker     General ADL Comments: Pt completed bed mobility, sat EOB a few minutes then completed simulated toilet transfer (EOB>recliner)     Vision         Perception     Praxis      Pertinent Vitals/Pain Pain Assessment: Faces Faces Pain Scale: Hurts little more Pain Location: left stump Pain Descriptors / Indicators: Guarding;Sore Pain Intervention(s): Monitored during session;Repositioned;Limited activity within patient's tolerance     Hand Dominance Right   Extremity/Trunk Assessment Upper Extremity Assessment Upper Extremity Assessment: LUE deficits/detail;Generalized weakness LUE Deficits / Details: L hand edematous and painful   Lower Extremity Assessment Lower Extremity Assessment: Defer to PT evaluation       Communication Communication Communication: No difficulties   Cognition Arousal/Alertness: Awake/alert Behavior During Therapy: [facility]  for tasks assessed/performed Overall Cognitive Status: Within Functional Limits for tasks assessed                                     General Comments       Exercises     Shoulder Instructions      Home Living  Family/patient expects to be discharged to:: Private residence Living Arrangements: Other relatives Available Help at Discharge: Family;Available 24 hours/day Type of Home: House Home Access: Stairs to enter [employer] of Steps: 3 Entrance Stairs-Rails: Right;Left Home Layout: One level     Bathroom Shower/Tub: Occupational psychologist: Handicapped height     Home Equipment: Clinical cytogeneticist - 4 wheels;Cane - single point          Prior Functioning/Environment Level of Independence: Independent with assistive device(s)        Comments: using cane, independent with selfcare        OT Problem List: Decreased strength;Decreased activity tolerance;Impaired balance (sitting and/or standing);Decreased knowledge of use of DME or AE;Decreased knowledge of precautions;Pain;Impaired UE functional use      OT Treatment/Interventions: Self-care/ADL training;Therapeutic exercise;Energy conservation;DME and/or AE instruction;Therapeutic activities;Patient/family education;Balance training    OT Goals(Current goals can be found in the care plan section) Acute Rehab OT Goals Patient Stated Goal: I want to be active and Independent OT Goal Formulation: With patient Time For Goal Achievement: 07/17/19 Potential to Achieve Goals: Good ADL Goals Pt Will Perform Upper Body Bathing: sitting;with set-up Pt Will Perform Lower Body Bathing: with min guard assist;sit to/from stand;sitting/lateral leans Pt Will Perform Upper Body Dressing: with set-up;sitting Pt Will Perform Lower Body Dressing: with min guard assist;sitting/lateral leans;sit to/from stand Pt Will Transfer to Toilet: with min guard assist;stand pivot transfer;bedside commode Pt Will Perform Toileting - Clothing Manipulation and hygiene: with supervision;with min guard assist;sitting/lateral leans;sit to/from stand Additional ADL Goal #1: Pt will complete bed mobility at supervision level to prepare for  OOB/EOB ADLs.  OT Frequency: Min 2X/week   Barriers to D/C:            Co-evaluation              AM-PAC OT "6 Clicks" Daily Activity     Outcome Measure Help from another person eating meals?: None Help from another person taking care of personal grooming?: None Help from another person toileting, which includes using toliet, bedpan, or urinal?: A Lot Help from another person bathing (including washing, rinsing, drying)?: A Lot Help from another person to put on and taking off regular upper body clothing?: A Little Help from another person to put on and taking off regular lower body clothing?: A Lot 6 Click Score: 17   End of Session Equipment Utilized During Treatment: Gait belt;Rolling walker  Activity Tolerance: Patient tolerated treatment well Patient left: in chair;with call bell/phone within reach;with chair alarm set  OT Visit Diagnosis: Pain;Muscle weakness (generalized) (M62.81);Other abnormalities of gait and mobility (R26.89)                Time: 1005-1029 OT Time Calculation (min): 24 min Charges:  OT General Charges $OT Visit: 1 Visit OT Evaluation $OT Eval Low Complexity: 1 Low OT Treatments $Self Care/Home Management : 8-22 mins  Tyrone Schimke, OT Acute Rehabilitation Services Pager: [phone] Office: [phone]   Hortencia Pilar 07/03/2019, 10:51 AM

## 2019-07-04 LAB — BASIC METABOLIC PANEL
Anion gap: 9 (ref 5–15)
BUN: 7 mg/dL — ABNORMAL LOW (ref 8–23)
CO2: 23 mmol/L (ref 22–32)
Calcium: 6.3 mg/dL — CL (ref 8.9–10.3)
Chloride: 107 mmol/L (ref 98–111)
Creatinine, Ser: 0.86 mg/dL (ref 0.44–1.00)
GFR calc Af Amer: >60 mL/min (ref >60)
GFR calc non Af Amer: >60 mL/min (ref >60)
Glucose, Bld: 95 mg/dL (ref 70–99)
Potassium: 3.5 mmol/L (ref 3.5–5.1)
Sodium: 139 mmol/L (ref 135–145)

## 2019-07-04 LAB — CBC
HCT: 21.4 % — ABNORMAL LOW (ref 36.0–46.0)
Hemoglobin: 7.3 g/dL — ABNORMAL LOW (ref 12.0–15.0)
MCH: 31.1 pg (ref 26.0–34.0)
MCHC: 34.1 g/dL (ref 30.0–36.0)
MCV: 91.1 fL (ref 80.0–100.0)
Platelets: 262 10*3/uL (ref 150–400)
RBC: 2.35 MIL/uL — ABNORMAL LOW (ref 3.87–5.11)
RDW: 14.3 % (ref 11.5–15.5)
WBC: 9.1 10*3/uL (ref 4.0–10.5)
nRBC: 0 % (ref 0.0–0.2)

## 2019-07-04 LAB — SURGICAL PATHOLOGY

## 2019-07-04 MED ORDER — CALCIUM GLUCONATE-NACL 1-0.675 GM/50ML-% IV SOLN
1.0000 g | Freq: Once | INTRAVENOUS | Status: AC
  Administered 2019-07-04: 1000 mg via INTRAVENOUS
  Filled 2019-07-04: qty 50

## 2019-07-04 NOTE — Consult Note (Signed)
PV Navigator consult acknowledged and chart reviewed.   68 y/o female admitted with L leg ischemic pain from [facility] emergency dept. Patient has a hx of L thrombectomy with fem-pop bypass and right to left fem-fem bypass grafts that have failed and ultimately led to a L above the knee amputation on 07/01/2019 with Dr Donnetta Hutching.   Pertinent medical history to include PVD, PAD, CVA with L hemiparesis, HTN, HLD and CKD (stage 3).  She states leg is sore but her pain is much improved from yesterday. She is optimistic and awaiting inpatient rehab assessment. Denies other needs or barriers at this time.  Will continue to follow. Thank you for this consult. Cletis Media RN BSN CWS [place] [phone]

## 2019-07-04 NOTE — Progress Notes (Signed)
CRITICAL VALUE ALERT  Critical Value:  Calcium 6.3  Date & Time Notied:  07/04/19 @ [id number]  Provider Notified: Lurlean Leyden of [place]  Orders Received/Actions taken: Replacement ordered and administered.

## 2019-07-04 NOTE — Progress Notes (Addendum)
  Progress Note    07/04/2019 7:25 AM 3 Days Post-Op  Subjective:  Says her leg is sore but it is better than yesterday  Afebrile HR [id number]  [id number] systolic [id number] RA  Vitals:   07/03/19 2310 07/04/19 0348  BP:  127/69  Pulse:  90  Resp: 13 20  Temp:  98.8 F (37.1 C)  SpO2:  99%    Physical Exam: Incisions:  Clean and dry with staples in tact.    CBC    Component Value Date/Time   WBC 9.1 07/04/2019 0339   RBC 2.35 (L) 07/04/2019 0339   HGB 7.3 (L) 07/04/2019 0339   HCT 21.4 (L) 07/04/2019 0339   PLT 262 07/04/2019 0339   MCV 91.1 07/04/2019 0339   MCH 31.1 07/04/2019 0339   MCHC 34.1 07/04/2019 0339   RDW 14.3 07/04/2019 0339   LYMPHSABS 1.6 06/28/2019 0108   MONOABS 1.8 (H) 06/28/2019 0108   EOSABS 0.0 06/28/2019 0108   BASOSABS 0.0 06/28/2019 0108    BMET    Component Value Date/Time   NA 139 07/04/2019 0339   K 3.5 07/04/2019 0339   CL 107 07/04/2019 0339   CO2 23 07/04/2019 0339   GLUCOSE 95 07/04/2019 0339   BUN 7 (L) 07/04/2019 0339   CREATININE 0.86 07/04/2019 0339   CREATININE 2.28 (H) 06/03/2019 0927   CALCIUM 6.3 (LL) 07/04/2019 0339   GFRNONAA >60 07/04/2019 0339   GFRNONAA 22 (L) 06/03/2019 0927   GFRAA >60 07/04/2019 0339   GFRAA 25 (L) 06/03/2019 0927    INR    Component Value Date/Time   INR 1.2 06/26/2019 0514     Intake/Output Summary (Last 24 hours) at 07/04/2019 0725 Last data filed at 07/04/2019 0600 Gross per 24 hour  Intake 1304.99 ml  Output -  Net 1304.99 ml     Assessment/Plan:  68 y.o. female is s/p left above knee amputation  3 Days Post-Op  -incision is clean with staples in tact.  Bandage will not stay on-retention sock ordered from [employer]. -hopefully CIR soon if insurance approves. -f/u with Dr. Donnetta Hutching in 4 weeks for staple removal.  Our office will arrange.    Leontine Locket, PA-C [facility] [phone] 07/04/2019 7:25 AM   I have examined the patient, reviewed and agree  with above.  Curt Jews, MD 07/04/2019 11:14 AM

## 2019-07-04 NOTE — Progress Notes (Signed)
Orthopedic Tech Progress Note Patient Details:  Wendy Townsend [birth year] [id number] Called in order to [facility] for an [place] Patient ID: Wendy Townsend, female   DOB: Jan 09, 1951, 68 y.o.   MRN: [id number]   [name] 07/04/2019, 8:38 AM

## 2019-07-04 NOTE — Progress Notes (Signed)
PROGRESS NOTE    Armanee Nuffer  [id number] DOB: 12/13/1950 DOA: 06/25/2019 PCP: Sandrea Hughs, NP   Brief Narrative: Per HPI 68 year old woman PMH peripheral vascular disease, CKD stage III, stroke PRESENTED to [place] long the emergency department with 2 days of severe foot pain. Ultrasound showed occlusion of left femoral popliteal bypass.  Patient failed thrombolysis and ended up having left above-knee amputation on 07/01/2019. Postop doing well.  Subjective: Resting well. some left stump pain- on po pain meds. On ROOM AIR . NO new complaints  Assessment & Plan:   Left lower extremity gangrene status post left AKA 10/12 : Was on Vanco and aztreonam stopped 10/14.  Stable doing well continue pain control with oral and IV opiates.  Continue ASA 81 mg.  Continue PT OT and has advised CIR. follow-up with Dr. Donnetta Hutching in 4 weeks for staple removal.  AKI on CKD stage III: Resolved with IV fluids, HCTZ and ACE inhibitor remains on hold.  Essential hypertension: Blood pressure well controlled 120s to 140.  On low-dose Coreg.  Acute blood loss anemia in the setting of anemia in chronic kidney disease: Hemoglobin at 7.3 g.  Today.  Previously 5.5 on 10/9 after surgery needing blood transfusion, had received 4 units PRBC. Recent Labs  Lab 06/29/19 0251 06/30/19 0510 07/02/19 1631 07/03/19 0445 07/04/19 0339  HGB 7.9* 11.2* 8.1* 7.8* 7.3*  HCT 21.9* 31.9* 23.0* 22.9* 21.4*   History of stroke with mild left-sided weakness and aspirin.  Hyponatremia 2/2 dehydration volume depletion and also from HCTZ, resolved with normal saline Hypokalemia/hypomagnesemia: Resolved Hypocalcemia with hypoalbuminemia corrected calcium is stable.  History of tobacco abuse and substance abuse: Counseling provided previously  Body mass index is 25.23 kg/m.   DVT prophylaxis: SCD.  IV heparin was stopped due to acute drop in hemoglobin previously. Code Status: full Family Communication: plan of  care discussed with patient in detail.  No family at bedside. Disposition Plan: Remains inpatient pending placement to CIR.  Consultants:  vascular  Procedures: Thrombolysis 06/26/2019 Left below-knee popliteal artery angioplasty 06/27/2019 Left above-knee amputation 07/01/2019 Antimicrobials:vanco and aztreonam stopped 07/03/2019  Antimicrobials: Anti-infectives (From admission, onward)   Start     Dose/Rate Route Frequency Ordered Stop   06/27/19 1500  vancomycin (VANCOCIN) IVPB 750 mg/150 ml premix  Status:  Discontinued     750 mg 150 mL/hr over 60 Minutes Intravenous Every 48 hours 06/25/19 1847 06/27/19 1016   06/27/19 1100  vancomycin (VANCOCIN) IVPB 750 mg/150 ml premix     750 mg 150 mL/hr over 60 Minutes Intravenous Every 24 hours 06/27/19 1016 07/03/19 1201   06/27/19 0800  aztreonam (AZACTAM) 1 g in sodium chloride 0.9 % 100 mL IVPB     1 g 200 mL/hr over 30 Minutes Intravenous Every 8 hours 06/27/19 0611 07/03/19 2359   06/26/19 0600  aztreonam (AZACTAM) 1 g in sodium chloride 0.9 % 100 mL IVPB  Status:  Discontinued     1 g 200 mL/hr over 30 Minutes Intravenous Every 8 hours 06/25/19 1847 06/27/19 0612   06/25/19 1830  aztreonam (AZACTAM) 2 g in sodium chloride 0.9 % 100 mL IVPB     2 g 200 mL/hr over 30 Minutes Intravenous  Once 06/25/19 1807 06/25/19 2131   06/25/19 1445  vancomycin (VANCOCIN) IVPB 1000 mg/200 mL premix     1,000 mg 200 mL/hr over 60 Minutes Intravenous  Once 06/25/19 1441 06/25/19 1604       Objective: Vitals:   07/03/19 2030 07/03/19  2310 07/04/19 0348 07/04/19 0830  BP: 120/65  127/69 135/84  Pulse: 95  90 95  Resp: 15 13 20 20   Temp: 98.6 F (37 C)  98.8 F (37.1 C)   TempSrc: Oral  Oral   SpO2: 100%  99% 99%  Weight:      Height:        Intake/Output Summary (Last 24 hours) at 07/04/2019 0837 Last data filed at 07/04/2019 0600 Gross per 24 hour  Intake 1304.99 ml  Output -  Net 1304.99 ml   Filed Weights   06/25/19 1826   Weight: 66.7 kg   Weight change:   Body mass index is 25.23 kg/m.  Intake/Output from previous day: 10/14 0701 - 10/15 0700 In: [id number] [P.O.:600; IV Piggyback:705] Out: -  Intake/Output this shift: No intake/output data recorded.  Examination:  General exam: Appears calm and comfortable, ON RA, Not in distress, older fore the age HEENT:PERRL,Oral mucosa moist, Ear/Nose normal on gross exam Respiratory system: Bilateral equal air entry, normal vesicular breath sounds, no wheezes or crackles  Cardiovascular system: S1 & S2 heard,No JVD, murmurs. Gastrointestinal system: Abdomen is  soft, non tender, non distended, BS +  Nervous System:Alert and oriented. No focal neurological deficits/moving extremities, sensation intact. Extremities: left stump with staples+, dru, clea, No edema, no clubbing, distal peripheral pulses palpable. Skin: No rashes, lesions, no icterus MSK: Normal muscle bulk,tone ,power  Medications:  Scheduled Meds: . aspirin EC  81 mg Oral Daily  . carvedilol  3.125 mg Oral BID WC  . Chlorhexidine Gluconate Cloth  6 each Topical Daily  . Chlorhexidine Gluconate Cloth  6 each Topical Daily  . mupirocin ointment  1 application Nasal BID  . saccharomyces boulardii  250 mg Oral BID  . sodium chloride flush  3 mL Intravenous Q12H  . sodium chloride flush  3 mL Intravenous Q12H   Continuous Infusions: . sodium chloride      Data Reviewed: I have personally reviewed following labs and imaging studies  CBC: Recent Labs  Lab 06/28/19 0108  06/29/19 0251 06/30/19 0510 07/02/19 1631 07/03/19 0445 07/04/19 0339  WBC 24.5*   < > 17.1* 13.4* 11.5* 11.4* 9.1  NEUTROABS 20.9*  --   --   --   --   --   --   HGB 5.8*   < > 7.9* 11.2* 8.1* 7.8* 7.3*  HCT 16.9*   < > 21.9* 31.9* 23.0* 22.9* 21.4*  MCV 91.8   < > 86.6 88.6 89.8 89.5 91.1  PLT 218   < > 161 169 188 229 262   < > = values in this interval not displayed.   Basic Metabolic Panel: Recent Labs  Lab  06/27/19 1025  06/28/19 0108  06/28/19 1600 06/29/19 0251 06/30/19 0510 07/01/19 0458 07/02/19 1631 07/03/19 0445 07/04/19 0339  NA  --    < > 137  --   --   --  138 138 136 137 139  K  --    < > 3.4*  --   --   --  3.4* 3.5 3.5 3.6 3.5  CL  --    < > 109  --   --   --  110 107 104 107 107  CO2  --    < > 18*  --   --   --  18* 22 22 22 23   GLUCOSE  --    < > 145*  --   --   --  99  94 139* 103* 95  BUN  --    < > 25*  --   --   --  14 11 8  6* 7*  CREATININE  --   --  1.32*   < >  --  0.99 1.05* 1.02* 0.88 0.84 0.86  CALCIUM  --    < > 7.2*  --   --   --  7.4* 7.2* 6.5* 6.3* 6.3*  MG 1.0*  --  0.9*  --  2.5* 1.9  --   --   --   --   --    < > = values in this interval not displayed.   GFR: Estimated Creatinine Clearance: 59.6 mL/min (by C-G formula based on SCr of 0.86 mg/dL). Liver Function Tests: Recent Labs  Lab 06/28/19 0108 06/30/19 0510 07/01/19 0458 07/02/19 1631 07/03/19 0445  AST 16 29 22 16 16   ALT 10 12 12 9 9   ALKPHOS 69 70 75 56 61  BILITOT 0.9 1.0 0.8 0.8 1.0  PROT 5.2* 5.3* 5.3* 5.0* 4.9*  ALBUMIN 1.9* 1.9* 1.9* 1.8* 1.9*   No results for input(s): LIPASE, AMYLASE in the last 168 hours. No results for input(s): AMMONIA in the last 168 hours. Coagulation Profile: No results for input(s): INR, PROTIME in the last 168 hours. Cardiac Enzymes: No results for input(s): CKTOTAL, CKMB, CKMBINDEX, TROPONINI in the last 168 hours. BNP (last 3 results) No results for input(s): PROBNP in the last 8760 hours. HbA1C: No results for input(s): HGBA1C in the last 72 hours. CBG: No results for input(s): GLUCAP in the last 168 hours. Lipid Profile: No results for input(s): CHOL, HDL, LDLCALC, TRIG, CHOLHDL, LDLDIRECT in the last 72 hours. Thyroid Function Tests: No results for input(s): TSH, T4TOTAL, FREET4, T3FREE, THYROIDAB in the last 72 hours. Anemia Panel: No results for input(s): VITAMINB12, FOLATE, FERRITIN, TIBC, IRON, RETICCTPCT in the last 72 hours. Sepsis  Labs: No results for input(s): PROCALCITON, LATICACIDVEN in the last 168 hours.  Recent Results (from the past 240 hour(s))  SARS Coronavirus 2 [facility] order, Performed in [facility] hospital lab) Nasopharyngeal Nasopharyngeal Swab     Status: None   Collection Time: 06/25/19  2:57 PM   Specimen: Nasopharyngeal Swab  Result Value Ref Range Status   SARS Coronavirus 2 NEGATIVE NEGATIVE Final    Comment: (NOTE) If result is NEGATIVE SARS-CoV-2 target nucleic acids are NOT DETECTED. The SARS-CoV-2 RNA is generally detectable in upper and lower  respiratory specimens during the acute phase of infection. The lowest  concentration of SARS-CoV-2 viral copies this assay can detect is 250  copies / mL. A negative result does not preclude SARS-CoV-2 infection  and should not be used as the sole basis for treatment or other  patient management decisions.  A negative result may occur with  improper specimen collection / handling, submission of specimen other  than nasopharyngeal swab, presence of viral mutation(s) within the  areas targeted by this assay, and inadequate number of viral copies  (<250 copies / mL). A negative result must be combined with clinical  observations, patient history, and epidemiological information. If result is POSITIVE SARS-CoV-2 target nucleic acids are DETECTED. The SARS-CoV-2 RNA is generally detectable in upper and lower  respiratory specimens dur ing the acute phase of infection.  Positive  results are indicative of active infection with SARS-CoV-2.  Clinical  correlation with patient history and other diagnostic information is  necessary to determine patient infection status.  Positive results do  not  rule out bacterial infection or co-infection with other viruses. If result is PRESUMPTIVE POSTIVE SARS-CoV-2 nucleic acids MAY BE PRESENT.   A presumptive positive result was obtained on the submitted specimen  and confirmed on repeat testing.  While 2019  novel coronavirus  (SARS-CoV-2) nucleic acids may be present in the submitted sample  additional confirmatory testing may be necessary for epidemiological  and / or clinical management purposes  to differentiate between  SARS-CoV-2 and other Sarbecovirus currently known to infect humans.  If clinically indicated additional testing with an alternate test  methodology [phone]) is advised. The SARS-CoV-2 RNA is generally  detectable in upper and lower respiratory sp ecimens during the acute  phase of infection. The expected result is Negative. Fact Sheet for Patients:  StrictlyIdeas.no Fact Sheet for Healthcare Providers: BankingDealers.co.za This test is not yet approved or cleared by the [place] FDA and has been authorized for detection and/or diagnosis of SARS-CoV-2 by FDA under an Emergency Use Authorization (EUA).  This EUA will remain in effect (meaning this test can be used) for the duration of the COVID-19 declaration under Section 564(b)(1) of the Act, 21 U.S.C. section 360bbb-3(b)(1), unless the authorization is terminated or revoked sooner. Performed at [facility], [place] [address] [place], [place] [address]   MRSA PCR Screening     Status: None   Collection Time: 06/28/19  4:29 PM   Specimen: Nasal Mucosa; Nasopharyngeal  Result Value Ref Range Status   MRSA by PCR NEGATIVE NEGATIVE Final    Comment:        The GeneXpert MRSA Assay (FDA approved for NASAL specimens only), is one component of a comprehensive MRSA colonization surveillance program. It is not intended to diagnose MRSA infection nor to guide or monitor treatment for MRSA infections. Performed at [facility], [place] [address], [place], [place] [address]   Urine Culture     Status: None   Collection Time: 06/30/19 12:15 AM   Specimen: Urine, Random  Result Value Ref Range Status   Specimen Description URINE, RANDOM  Final    Special Requests NONE  Final   Culture   Final    NO GROWTH Performed at [facility], [place] [address] [place], [place] [address]    Report Status 07/01/2019 FINAL  Final  Surgical pcr screen     Status: Abnormal   Collection Time: 07/01/19  1:05 AM   Specimen: Nasal Mucosa; Nasal Swab  Result Value Ref Range Status   MRSA, PCR NEGATIVE NEGATIVE Final   Staphylococcus aureus POSITIVE (A) NEGATIVE Final    Comment: (NOTE) The Xpert SA Assay (FDA approved for NASAL specimens in patients 7 years of age and older), is one component of a comprehensive surveillance program. It is not intended to diagnose infection nor to guide or monitor treatment. Performed at [facility], [place] [address] [place], [place] [address]       Radiology Studies: No results found.    LOS: 9 days   Time spent: More than 50% of that time was spent in counseling and/or coordination of care.  Antonieta Pert, MD [facility]  07/04/2019, 8:37 AM

## 2019-07-05 LAB — BASIC METABOLIC PANEL
Anion gap: 9 (ref 5–15)
BUN: 5 mg/dL — ABNORMAL LOW (ref 8–23)
CO2: 23 mmol/L (ref 22–32)
Calcium: 6.3 mg/dL — CL (ref 8.9–10.3)
Chloride: 105 mmol/L (ref 98–111)
Creatinine, Ser: 0.87 mg/dL (ref 0.44–1.00)
GFR calc Af Amer: >60 mL/min (ref >60)
GFR calc non Af Amer: >60 mL/min (ref >60)
Glucose, Bld: 90 mg/dL (ref 70–99)
Potassium: 3.4 mmol/L — ABNORMAL LOW (ref 3.5–5.1)
Sodium: 137 mmol/L (ref 135–145)

## 2019-07-05 LAB — CBC
HCT: 22.5 % — ABNORMAL LOW (ref 36.0–46.0)
Hemoglobin: 7.6 g/dL — ABNORMAL LOW (ref 12.0–15.0)
MCH: 30.9 pg (ref 26.0–34.0)
MCHC: 33.8 g/dL (ref 30.0–36.0)
MCV: 91.5 fL (ref 80.0–100.0)
Platelets: 303 10*3/uL (ref 150–400)
RBC: 2.46 MIL/uL — ABNORMAL LOW (ref 3.87–5.11)
RDW: 14.4 % (ref 11.5–15.5)
WBC: 7.8 10*3/uL (ref 4.0–10.5)
nRBC: 0 % (ref 0.0–0.2)

## 2019-07-05 LAB — CREATININE, SERUM
Creatinine, Ser: 0.87 mg/dL (ref 0.44–1.00)
GFR calc Af Amer: >60 mL/min (ref >60)
GFR calc non Af Amer: >60 mL/min (ref >60)

## 2019-07-05 MED ORDER — CALCIUM CARBONATE-VITAMIN D 500-200 MG-UNIT PO TABS
2.0000 | ORAL_TABLET | Freq: Two times a day (BID) | ORAL | Status: DC
  Administered 2019-07-05 – 2019-07-08 (×7): 2 via ORAL
  Filled 2019-07-05 (×6): qty 2

## 2019-07-05 NOTE — Progress Notes (Signed)
CSW called and left a message with Dorian Pod with [facility] to see if she would be able to take the patient.   CSW is awaiting a return phone call.   Domenic Schwab, MSW, [place] Worker [facility]  [phone]

## 2019-07-05 NOTE — Progress Notes (Signed)
PROGRESS NOTE    Wendy Townsend  [id number] DOB: [birth year] DOA: 06/25/2019 PCP: Sandrea Hughs, NP   Brief Narrative: Per HPI 68 year old woman PMH peripheral vascular disease, CKD stage III, stroke PRESENTED to [place] [facility] the emergency department with 2 days of severe foot pain. Ultrasound showed occlusion of left femoral popliteal bypass.  Patient failed thrombolysis and ended up having left above-knee amputation on 07/01/2019. Postop doing well.  Subjective: Resting well. some left stump pain- on po pain meds. On ROOM AIR . NO new complaints  Assessment & Plan:  68 year old woman PMH peripheral vascular disease, CKD stage III, stroke PRESENTED to [place] [facility] the emergency department with 2 days of severe foot pain. Ultrasound showed occlusion of left femoral popliteal bypass.Patient failed thrombolysis and ended up having left above-knee amputation on 07/01/2019. Postop doing well.  Subjective: Seen and examined.  Pending stable.  Some weakness. No acute events overnight.  Assessment & Plan:   Left lower extremity gangrene status post left AKA 10/12 : Was on Vanco and aztreonam stopped 10/14.  Stable doing well continue pain control with oral and IV opiates.  Continue ASA 81 mg.Continue PT OT and has advised CIR. follow-up with Dr. Donnetta Hutching in 4 weeks for staple removal.  AKI on CKD stage III: Resolved with IV fluids, HCTZ and ACE inhibitor remains on hold.  Essential hypertension: Blood pressure stable. On low-dose Coreg.  HCTZ and ACE inhibitor on hold can resume slowly if blood pressure starts to trend up  Acute blood loss anemia in the setting of anemia in chronic kidney disease: Hemoglobin at 7.3-7.6 gm. Today.  Previously 5.5 on 10/9 after surgery needing blood transfusion, had received 4 units PRBC.  Check CBC in the morning if STILL LOW and she is symptomatic we can transfuse 1 unit of PRBC prior to discharge.  Recent Labs  Lab 06/30/19 0510 07/02/19 1631  07/03/19 0445 07/04/19 0339 07/05/19 0217  HGB 11.2* 8.1* 7.8* 7.3* 7.6*  HCT 31.9* 23.0* 22.9* 21.4* 22.5*    History of stroke with mild left-sided weakness and aspirin.  Hyponatremia 2/2 dehydration volume depletion and also from HCTZ, resolved with normal saline.  Off fluids continue oral hydration.  Hypokalemia/hypomagnesemia:Resolved  Hypocalcemia with hypoalbuminemia corrected calcium is stable at 8.2 (calcium at 6.3 and albumin at 1.9). start on calcium vitamin D supplementation.  History of tobacco abuse and substance abuse:Counseling provided previously  Body mass index is 25.23 kg/m.   DVT prophylaxis: SCD.  IV heparin was stopped due to acute drop in hemoglobin previously. Code Status: full Family Communication: plan of care discussed with patient in detail.  No family at bedside.  Reports her family and brothers are awar. Disposition Plan: CIR denied by insurance.  I was able to do peer to peer review -they would like to review the updated PT notes and if patient is able to complete 3 hours/day of rehab she would likely be approved otherwise she will be approved for skilled nursing facility per  Insurance physician.Repeat CBC in the morning prior to disposition.    Consultants:  vascular  Procedures: Thrombolysis 06/26/2019 Left below-knee popliteal artery angioplasty 06/27/2019 Left above-knee amputation 07/01/2019 Antimicrobials:vanco and aztreonam stopped 07/03/2019  Antimicrobials: Anti-infectives (From admission, onward)   Start     Dose/Rate Route Frequency Ordered Stop   06/27/19 1500  vancomycin (VANCOCIN) IVPB 750 mg/150 ml premix  Status:  Discontinued     750 mg 150 mL/hr over 60 Minutes Intravenous Every 48 hours 06/25/19 1847 06/27/19 1016  06/27/19 1100  vancomycin (VANCOCIN) IVPB 750 mg/150 ml premix     750 mg 150 mL/hr over 60 Minutes Intravenous Every 24 hours 06/27/19 1016 07/03/19 1201   06/27/19 0800  aztreonam (AZACTAM) 1 g in sodium  chloride 0.9 % 100 mL IVPB     1 g 200 mL/hr over 30 Minutes Intravenous Every 8 hours 06/27/19 0611 07/03/19 2359   06/26/19 0600  aztreonam (AZACTAM) 1 g in sodium chloride 0.9 % 100 mL IVPB  Status:  Discontinued     1 g 200 mL/hr over 30 Minutes Intravenous Every 8 hours 06/25/19 1847 06/27/19 0612   06/25/19 1830  aztreonam (AZACTAM) 2 g in sodium chloride 0.9 % 100 mL IVPB     2 g 200 mL/hr over 30 Minutes Intravenous  Once 06/25/19 1807 06/25/19 2131   06/25/19 1445  vancomycin (VANCOCIN) IVPB 1000 mg/200 mL premix     1,000 mg 200 mL/hr over 60 Minutes Intravenous  Once 06/25/19 1441 06/25/19 1604       Objective: Vitals:   07/05/19 0344 07/05/19 0700 07/05/19 0800 07/05/19 0808  BP: 124/69  (!) 138/101 (!) 138/101  Pulse: 95   94  Resp: 20 17 13    Temp: 98.5 F (36.9 C)     TempSrc: Oral     SpO2: 99%     Weight:      Height:        Intake/Output Summary (Last 24 hours) at 07/05/2019 1026 Last data filed at 07/05/2019 0900 Gross per 24 hour  Intake 1080 ml  Output -  Net 1080 ml   Filed Weights   06/25/19 1826  Weight: 66.7 kg   Weight change:   Body mass index is 25.23 kg/m.  Intake/Output from previous day: 10/15 0701 - 10/16 0700 In: 720 [P.O.:720] Out: -  Intake/Output this shift: Total I/O In: 360 [P.O.:360] Out: -   Examination:  General exam: Appears calm and comfortable, not in acute distress. HEENT:PERRL,Oral mucosa moist, Ear/Nose normal on gross exam Respiratory system: Bilateral equal air entry, normal vesicular breath sounds, no wheezes or crackles  Cardiovascular system: S1 & S2 heard,No JVD, murmurs. Gastrointestinal system: Abdomen is  soft, non tender, non distended, BS +  Nervous System:Alert and oriented. No focal neurological deficits/moving extremities, sensation intact. Extremities: left stump with staples+, dry, clean, No edema, no clubbing, distal peripheral pulses palpable. Skin: No rashes, lesions, no icterus MSK:  Normal muscle bulk,tone ,power  Medications:  Scheduled Meds: . aspirin EC  81 mg Oral Daily  . calcium-vitamin D  2 tablet Oral BID  . carvedilol  3.125 mg Oral BID WC  . Chlorhexidine Gluconate Cloth  6 each Topical Daily  . Chlorhexidine Gluconate Cloth  6 each Topical Daily  . mupirocin ointment  1 application Nasal BID  . saccharomyces boulardii  250 mg Oral BID  . sodium chloride flush  3 mL Intravenous Q12H  . sodium chloride flush  3 mL Intravenous Q12H   Continuous Infusions: . sodium chloride      Data Reviewed: I have personally reviewed following labs and imaging studies  CBC: Recent Labs  Lab 06/30/19 0510 07/02/19 1631 07/03/19 0445 07/04/19 0339 07/05/19 0217  WBC 13.4* 11.5* 11.4* 9.1 7.8  HGB 11.2* 8.1* 7.8* 7.3* 7.6*  HCT 31.9* 23.0* 22.9* 21.4* 22.5*  MCV 88.6 89.8 89.5 91.1 91.5  PLT 169 188 229 262 [id number]   Basic Metabolic Panel: Recent Labs  Lab 06/28/19 1600 06/29/19 0251  07/01/19 0458 07/02/19  1631 07/03/19 0445 07/04/19 0339 07/05/19 0217 07/05/19 0853  NA  --   --    < > 138 136 137 139  --  137  K  --   --    < > 3.5 3.5 3.6 3.5  --  3.4*  CL  --   --    < > 107 104 107 107  --  105  CO2  --   --    < > 22 22 22 23   --  23  GLUCOSE  --   --    < > 94 139* 103* 95  --  90  BUN  --   --    < > 11 8 6* 7*  --  5*  CREATININE  --  0.99   < > 1.02* 0.88 0.84 0.86 0.87 0.87  CALCIUM  --   --    < > 7.2* 6.5* 6.3* 6.3*  --  6.3*  MG 2.5* 1.9  --   --   --   --   --   --   --    < > = values in this interval not displayed.   GFR: Estimated Creatinine Clearance: 58.9 mL/min (by C-G formula based on SCr of 0.87 mg/dL). Liver Function Tests: Recent Labs  Lab 06/30/19 0510 07/01/19 0458 07/02/19 1631 07/03/19 0445  AST 29 22 16 16   ALT 12 12 9 9   ALKPHOS 70 75 56 61  BILITOT 1.0 0.8 0.8 1.0  PROT 5.3* 5.3* 5.0* 4.9*  ALBUMIN 1.9* 1.9* 1.8* 1.9*   No results for input(s): LIPASE, AMYLASE in the last 168 hours. No results for  input(s): AMMONIA in the last 168 hours. Coagulation Profile: No results for input(s): INR, PROTIME in the last 168 hours. Cardiac Enzymes: No results for input(s): CKTOTAL, CKMB, CKMBINDEX, TROPONINI in the last 168 hours. BNP (last 3 results) No results for input(s): PROBNP in the last 8760 hours. HbA1C: No results for input(s): HGBA1C in the last 72 hours. CBG: No results for input(s): GLUCAP in the last 168 hours. Lipid Profile: No results for input(s): CHOL, HDL, LDLCALC, TRIG, CHOLHDL, LDLDIRECT in the last 72 hours. Thyroid Function Tests: No results for input(s): TSH, T4TOTAL, FREET4, T3FREE, THYROIDAB in the last 72 hours. Anemia Panel: No results for input(s): VITAMINB12, FOLATE, FERRITIN, TIBC, IRON, RETICCTPCT in the last 72 hours. Sepsis Labs: No results for input(s): PROCALCITON, LATICACIDVEN in the last 168 hours.  Recent Results (from the past 240 hour(s))  SARS Coronavirus 2 [facility] order, Performed in [facility] hospital lab) Nasopharyngeal Nasopharyngeal Swab     Status: None   Collection Time: 06/25/19  2:57 PM   Specimen: Nasopharyngeal Swab  Result Value Ref Range Status   SARS Coronavirus 2 NEGATIVE NEGATIVE Final    Comment: (NOTE) If result is NEGATIVE SARS-CoV-2 target nucleic acids are NOT DETECTED. The SARS-CoV-2 RNA is generally detectable in upper and lower  respiratory specimens during the acute phase of infection. The lowest  concentration of SARS-CoV-2 viral copies this assay can detect is 250  copies / mL. A negative result does not preclude SARS-CoV-2 infection  and should not be used as the sole basis for treatment or other  patient management decisions.  A negative result may occur with  improper specimen collection / handling, submission of specimen other  than nasopharyngeal swab, presence of viral mutation(s) within the  areas targeted by this assay, and inadequate number of viral copies  (<250 copies / mL).  A negative result must be  combined with clinical  observations, patient history, and epidemiological information. If result is POSITIVE SARS-CoV-2 target nucleic acids are DETECTED. The SARS-CoV-2 RNA is generally detectable in upper and lower  respiratory specimens dur ing the acute phase of infection.  Positive  results are indicative of active infection with SARS-CoV-2.  Clinical  correlation with patient history and other diagnostic information is  necessary to determine patient infection status.  Positive results do  not rule out bacterial infection or co-infection with other viruses. If result is PRESUMPTIVE POSTIVE SARS-CoV-2 nucleic acids MAY BE PRESENT.   A presumptive positive result was obtained on the submitted specimen  and confirmed on repeat testing.  While 2019 novel coronavirus  (SARS-CoV-2) nucleic acids may be present in the submitted sample  additional confirmatory testing may be necessary for epidemiological  and / or clinical management purposes  to differentiate between  SARS-CoV-2 and other Sarbecovirus currently known to infect humans.  If clinically indicated additional testing with an alternate test  methodology [phone]) is advised. The SARS-CoV-2 RNA is generally  detectable in upper and lower respiratory sp ecimens during the acute  phase of infection. The expected result is Negative. Fact Sheet for Patients:  StrictlyIdeas.no Fact Sheet for Healthcare Providers: BankingDealers.co.za This test is not yet approved or cleared by the [place] FDA and has been authorized for detection and/or diagnosis of SARS-CoV-2 by FDA under an Emergency Use Authorization (EUA).  This EUA will remain in effect (meaning this test can be used) for the duration of the COVID-19 declaration under Section 564(b)(1) of the Act, 21 U.S.C. section 360bbb-3(b)(1), unless the authorization is terminated or revoked sooner. Performed at [facility], [place] [address], [place], [place] [address]   MRSA PCR Screening     Status: None   Collection Time: 06/28/19  4:29 PM   Specimen: Nasal Mucosa; Nasopharyngeal  Result Value Ref Range Status   MRSA by PCR NEGATIVE NEGATIVE Final    Comment:        The GeneXpert MRSA Assay (FDA approved for NASAL specimens only), is one component of a comprehensive MRSA colonization surveillance program. It is not intended to diagnose MRSA infection nor to guide or monitor treatment for MRSA infections. Performed at [facility], [place] [address] [place], [place] [address]   Urine Culture     Status: None   Collection Time: 06/30/19 12:15 AM   Specimen: Urine, Random  Result Value Ref Range Status   Specimen Description URINE, RANDOM  Final   Special Requests NONE  Final   Culture   Final    NO GROWTH Performed at [facility], [place] [address], [place], [place] [address]    Report Status 07/01/2019 FINAL  Final  Surgical pcr screen     Status: Abnormal   Collection Time: 07/01/19  1:05 AM   Specimen: Nasal Mucosa; Nasal Swab  Result Value Ref Range Status   MRSA, PCR NEGATIVE NEGATIVE Final   Staphylococcus aureus POSITIVE (A) NEGATIVE Final    Comment: (NOTE) The Xpert SA Assay (FDA approved for NASAL specimens in patients 55 years of age and older), is one component of a comprehensive surveillance program. It is not intended to diagnose infection nor to guide or monitor treatment. Performed at [facility], [place] [address], [place], [place] [address]       Radiology Studies: No results found.    LOS: 10 days   Time  spent: More than 50% of that time was spent in counseling and/or coordination of care.  Antonieta Pert, MD [facility]  07/05/2019, 10:26 AM

## 2019-07-05 NOTE — Progress Notes (Signed)
Physical Therapy Treatment Patient Details Name: Wendy Townsend MRN: [id number] DOB: 07-01-1951 Today's Date: 07/05/2019    History of Present Illness Pt is a 68 y/o female with pmh significant for PVD, CKD3, stroke with mild L residual weakness, admitted to [facility] with 2 days of severe foot pain.  US showed occlusion of left fem pop BPG.  Vascular surgery ultimately failed with BPGing which led to L AKA.    PT Comments    Patient progressing well towards PT goals. Highly motivated to work with therapy. Worked on standing trials, transfers and initiated pre-gait with Min-mod A for balance/safety and use of RW. Worked on there ex of left residual limb in standing and sitting. Education re: pressure relief, positioning of left residual limb, phantom limb pain etc. Continues to require assist for standing balance especially during exercises. Demonstrates weakness through core and RLE. Demonstrates good tolerance to exercise and activity and would be a great CIR candidate as pt motivated to return to independence. Will follow.   Follow Up Recommendations  CIR;Supervision/Assistance - 24 hour     Equipment Recommendations  Wheelchair (measurements PT);Wheelchair cushion (measurements PT)    Recommendations for Other Services       Precautions / Restrictions Precautions Precautions: Fall Precaution Comments: left AKA - staples intact Restrictions Weight Bearing Restrictions: Yes LLE Weight Bearing: Non weight bearing    Mobility  Bed Mobility Overal bed mobility: Needs Assistance Bed Mobility: Supine to Sit     Supine to sit: Min guard;HOB elevated     General bed mobility comments: Able to get to EOB without assist.  Transfers Overall transfer level: Needs assistance Equipment used: Rolling walker (2 wheeled) Transfers: Sit to/from Stand Sit to Stand: Min assist Stand pivot transfers: Min guard       General transfer comment: Min A to power to standing with cues for hand  placement as pt pulling up on RW. Stood from EOB x3, from chair x3. Able to pivot to chair towards right side with Min guard assist for safety/balance.  Ambulation/Gait Ambulation/Gait assistance: Mod assist   Assistive device: Rolling walker (2 wheeled) Gait Pattern/deviations: Trunk flexed     General Gait Details: Pt tolerated hopping in place x5 with minimal clearance through RLE.   Stairs             Wheelchair Mobility    Modified Rankin (Stroke Patients Only)       Balance Overall balance assessment: Needs assistance Sitting-balance support: Feet supported;No upper extremity supported Sitting balance-Leahy Scale: Good     Standing balance support: During functional activity Standing balance-Leahy Scale: Poor Standing balance comment: Requires UE support for standing. Cues for hip extension and upright posture. needs cues to extend left hip.                            Cognition Arousal/Alertness: Awake/alert Behavior During Therapy: WFL for tasks assessed/performed Overall Cognitive Status: Within Functional Limits for tasks assessed                                 General Comments: for basic mobility tasks. Highly motivated to work with therapy.      Exercises Amputee Exercises Hip Extension: Left;10 reps;Standing Hip ABduction/ADduction: Left;5 reps;Standing Chair Push Up: Both;5 reps;Seated    General Comments        Pertinent Vitals/Pain Pain Assessment: 0-10 Pain Score: 5  Pain Location: left residual limb Pain Descriptors / Indicators: Guarding;Sore;Discomfort Pain Intervention(s): Repositioned;Monitored during session    Home Living                      Prior Function            PT Goals (current goals can now be found in the care plan section) Progress towards PT goals: Progressing toward goals    Frequency    Min 4X/week      PT Plan Current plan remains appropriate    Co-evaluation               AM-PAC PT "6 Clicks" Mobility   Outcome Measure  Help needed turning from your back to your side while in a flat bed without using bedrails?: A Little Help needed moving from lying on your back to sitting on the side of a flat bed without using bedrails?: A Little Help needed moving to and from a bed to a chair (including a wheelchair)?: A Little Help needed standing up from a chair using your arms (e.g., wheelchair or bedside chair)?: A Little Help needed to walk in hospital room?: A Lot Help needed climbing 3-5 steps with a railing? : Total 6 Click Score: 15    End of Session Equipment Utilized During Treatment: Gait belt Activity Tolerance: Patient tolerated treatment well Patient left: in chair;with call bell/phone within reach;with chair alarm set Nurse Communication: Mobility status PT Visit Diagnosis: Other abnormalities of gait and mobility (R26.89);Pain;Difficulty in walking, not elsewhere classified (R26.2) Pain - Right/Left: Left Pain - part of body: Leg     Time: 1137-1201 PT Time Calculation (min) (ACUTE ONLY): 24 min  Charges:  $Therapeutic Exercise: 8-22 mins $Therapeutic Activity: 8-22 mins                     Wray Kearns, [place], DPT Acute Rehabilitation Services Pager [phone] Office [place] 07/05/2019, 12:58 PM

## 2019-07-05 NOTE — Progress Notes (Signed)
  Progress Note    07/05/2019 10:07 AM 4 Days Post-Op  Subjective:  Sore but better than yesterday   Vitals:   07/05/19 0800 07/05/19 0808  BP: (!) 138/101 (!) 138/101  Pulse:  94  Resp: 13   Temp:    SpO2:      Physical Exam: Incisions:  Clean and dry; she has retention sock on and in plac3   CBC    Component Value Date/Time   WBC 7.8 07/05/2019 0217   RBC 2.46 (L) 07/05/2019 0217   HGB 7.6 (L) 07/05/2019 0217   HCT 22.5 (L) 07/05/2019 0217   PLT 303 07/05/2019 0217   MCV 91.5 07/05/2019 0217   MCH 30.9 07/05/2019 0217   MCHC 33.8 07/05/2019 0217   RDW 14.4 07/05/2019 0217   LYMPHSABS 1.6 06/28/2019 0108   MONOABS 1.8 (H) 06/28/2019 0108   EOSABS 0.0 06/28/2019 0108   BASOSABS 0.0 06/28/2019 0108    BMET    Component Value Date/Time   NA 137 07/05/2019 0853   K 3.4 (L) 07/05/2019 0853   CL 105 07/05/2019 0853   CO2 23 07/05/2019 0853   GLUCOSE 90 07/05/2019 0853   BUN 5 (L) 07/05/2019 0853   CREATININE 0.87 07/05/2019 0853   CREATININE 2.28 (H) 06/03/2019 0927   CALCIUM 6.3 (LL) 07/05/2019 0853   GFRNONAA >60 07/05/2019 0853   GFRNONAA 22 (L) 06/03/2019 0927   GFRAA >60 07/05/2019 0853   GFRAA 25 (L) 06/03/2019 0927    INR    Component Value Date/Time   INR 1.2 06/26/2019 0514     Intake/Output Summary (Last 24 hours) at 07/05/2019 1007 Last data filed at 07/05/2019 0900 Gross per 24 hour  Intake 1080 ml  Output -  Net 1080 ml     Assessment/Plan:  68 y.o. female is s/p left above knee amputation  4 Days Post-Op  -stump healing well with staples in place. -hopeful for CIR soon -f/u with [facility] in 3-4 weeks for staple removal.  Our office will schedule.    Leontine Locket, PA-C [facility] [phone] 07/05/2019 10:07 AM

## 2019-07-05 NOTE — Progress Notes (Signed)
Inpatient Rehabilitation-Admissions Coordinator   Despite peer to peer completed by Dr. Lupita Leash, the denial for CIR was upheld. I have notified CM Kristi of the denial and will alert the patient of need for new disposition plan.   AC will sign off.   Please call if questions.   Jhonnie Garner, OTR/L  Rehab Admissions Coordinator  [phone] 07/05/2019 4:39 PM

## 2019-07-05 NOTE — Progress Notes (Signed)
CRITICAL VALUE ALERT  Critical Value:  Ca: 6.3  Date & Time Notied:  07/05/19 1006  Provider Notified: YES  Orders Received/Actions taken: Pending

## 2019-07-05 NOTE — TOC Initial Note (Signed)
Transition of Care [facility]) - Initial/Assessment Note    Patient Details  Name: Wendy Townsend MRN: [id number] Date of Birth: 1950-11-26  Transition of Care [facility]) CM/SW Contact:    Gelene Mink, [place] Phone Number: 07/05/2019, 4:44 PM  Clinical Narrative:            CSW called Tommi Rumps with [facility], and they are not able to take the patient. CSW called Tiffany with [facility], they do not contract with [employer]. CSW called Dorian Pod with [facility]. They are able to accept the patient and provide home health physical therapy and occupational therapy.   The patient will need DME orders and home health orders.          Expected Discharge Plan: [place] Barriers to Discharge: Continued Medical Work up   Patient Goals and CMS Choice Patient states their goals for this hospitalization and ongoing recovery are:: Pt wants to go home with home health CMS Medicare.gov Compare Post Acute Care list provided to:: Patient Choice offered to / list presented to : Patient  Expected Discharge Plan and Services Expected Discharge Plan: [place] In-house Referral: Clinical Social Work Discharge Planning Services: NA Post Acute Care Choice: [place] arrangements for the past 2 months: [place]                 DME Arranged: 3-N-1, Wheelchair manual DME Agency: [employer] Date DME Agency Contacted: 07/05/19 Time DME Agency Contacted: [phone] Representative spoke with at DME Agency: Thedore Mins HH Arranged: OT, PT [place] Agency: [facility] Date [place] Agency Contacted: 07/05/19 Time [place]: 55 Representative spoke with at [place]: Dorian Pod  Prior Living Arrangements/Services Living arrangements for the past 2 months: [place] with:: Self Patient language and need for interpreter reviewed:: No Do you feel safe going back to the place where you live?: Yes      Need for Family Participation in Patient Care: Yes  (Comment) Care giver support system in place?: Yes (comment) Current home services: DME Criminal Activity/Legal Involvement Pertinent to Current Situation/Hospitalization: No - Comment as needed  Activities of Daily Living Home Assistive Devices/Equipment: Cane (specify quad or straight), Walker (specify type) ADL Screening (condition at time of admission) Patient's cognitive ability adequate to safely complete daily activities?: Yes Is the patient deaf or have difficulty hearing?: No Does the patient have difficulty seeing, even when wearing glasses/contacts?: No Does the patient have difficulty concentrating, remembering, or making decisions?: No Patient able to express need for assistance with ADLs?: Yes Does the patient have difficulty dressing or bathing?: No Independently performs ADLs?: No Does the patient have difficulty walking or climbing stairs?: Yes Weakness of Legs: Both Weakness of Arms/Hands: None  Permission Sought/Granted Permission sought to share information with : Case Manager Permission granted to share information with : Yes, Verbal Permission Granted     Permission granted to share info w AGENCY: [facility]        Emotional Assessment Appearance:: Appears stated age     Orientation: : Oriented to Self, Oriented to Place, Oriented to  Time, Oriented to Situation Alcohol / Substance Use: Not Applicable Psych Involvement: No (comment)  Admission diagnosis:  Hypokalemia [E87.6] Left leg pain [M79.605] AKI (acute kidney injury) ([place]) [N17.9] Occlusion of arterial bypass graft, initial encounter ([place]) [T82.898A] Sepsis with acute renal failure, due to unspecified organism, unspecified acute renal failure type, unspecified whether septic shock present ([place]) [A41.9, R65.20, N17.9] Patient Active Problem  List   Diagnosis Date Noted  . Left foot pain 06/25/2019  . AKI (acute kidney injury) ([place]) 06/25/2019  . Dehydration 06/25/2019  . CKD (chronic kidney  disease), stage III 06/25/2019  . Anemia in chronic kidney disease (CKD) 06/25/2019  . History of tobacco abuse 08/10/2016  . Debilitated 07/20/2016  . Anemia of chronic disease   . Prediabetes   . Tobacco abuse   . Elevated bilirubin   . History of CVA (cerebrovascular accident)   . Benign essential HTN   . Diastolic dysfunction   . Coronary artery disease involving native coronary artery of native heart without angina pectoris   . Post-operative pain   . Acute blood loss anemia   . Diarrhea   . Hypophosphatemia   . Hypomagnesemia   . Hypoalbuminemia due to protein-calorie malnutrition ([place])   . Lymphocytosis   . Encounter for rehabilitation 07/18/2016  . Status post aortobifemoral bypass surgery   . Acute respiratory failure with hypoxia ([place])   . Acute renal failure superimposed on stage 3 chronic kidney disease ([place]) 07/13/2016  . Essential hypertension 07/13/2016  . Impaired glucose tolerance 07/13/2016  . Ischemic pain of foot, left   . Abnormal stress test   . Dyspnea   . Dry gangrene ([place]) 07/06/2016  . PVD (peripheral vascular disease) ([place]) 07/06/2016  . Hyperglycemia 07/06/2016  . Acute kidney injury superimposed on chronic kidney disease ([place]) 07/06/2016  . Protein-calorie malnutrition, severe 07/06/2016  . Ischemic pain of left foot 07/05/2016  . History of stroke 06/08/2016  . Left hemiparesis ([place]) 06/08/2016  . Numbness and tingling of left leg 06/08/2016  . Tobacco use disorder 06/08/2016  . Hypertension 06/28/2011  . Hyperlipidemia 06/28/2011  . PAD (peripheral artery disease) ([place] [address]) 06/28/2011   PCP:  Sandrea Hughs, NP Pharmacy:   [id number] [address], [place] [address] Phone: [phone] Fax: [phone]  [facility] [place] [place] - [address] [place] [address] Phone: [phone] Fax: [phone]     Social  Determinants of Health (SDOH) Interventions    Readmission Risk Interventions No flowsheet data found.

## 2019-07-05 NOTE — Progress Notes (Signed)
Patient is s/p left AKA which impairs their ability to perform daily activities like ADLs and mobility in the home.  A walker alone will not resolve the issues with performing activities of daily living. A wheelchair will allow patient to safely perform daily activities.  The patient can self propel in the home or has a caregiver who can provide assistance.      Wray Kearns, PT, DPT Acute Rehabilitation Services Pager [phone] Office [phone]

## 2019-07-05 NOTE — Care Management Important Message (Signed)
Important Message  Patient Details  Name: Wendy Townsend MRN: [id number] Date of Birth: 1951-07-05   Medicare Important Message Given:  Yes     Shelda Altes 07/05/2019, 12:09 PM

## 2019-07-05 NOTE — Progress Notes (Signed)
Notified by Delila Pereyra with INPT rehab that pt has received a denial from insurance for CIR admission- P2P was done however denial was upheld. Pt will need SNF placement- and per [employer] will be automatic approval for SNF if request is sent in within the next 48 hours.  CSW to follow up for placement needs- contact for [employer]-is Vanessa at [phone].  TOC supervisor Belva Crome. Notified of situation for expedited needs.

## 2019-07-06 LAB — BASIC METABOLIC PANEL
Anion gap: 9 (ref 5–15)
BUN: 6 mg/dL — ABNORMAL LOW (ref 8–23)
CO2: 21 mmol/L — ABNORMAL LOW (ref 22–32)
Calcium: 6.6 mg/dL — ABNORMAL LOW (ref 8.9–10.3)
Chloride: 108 mmol/L (ref 98–111)
Creatinine, Ser: 0.87 mg/dL (ref 0.44–1.00)
GFR calc Af Amer: >60 mL/min (ref >60)
GFR calc non Af Amer: >60 mL/min (ref >60)
Glucose, Bld: 94 mg/dL (ref 70–99)
Potassium: 3.7 mmol/L (ref 3.5–5.1)
Sodium: 138 mmol/L (ref 135–145)

## 2019-07-06 LAB — CBC
HCT: 22.5 % — ABNORMAL LOW (ref 36.0–46.0)
Hemoglobin: 7.4 g/dL — ABNORMAL LOW (ref 12.0–15.0)
MCH: 30.6 pg (ref 26.0–34.0)
MCHC: 32.9 g/dL (ref 30.0–36.0)
MCV: 93 fL (ref 80.0–100.0)
Platelets: 372 10*3/uL (ref 150–400)
RBC: 2.42 MIL/uL — ABNORMAL LOW (ref 3.87–5.11)
RDW: 14.3 % (ref 11.5–15.5)
WBC: 6.3 10*3/uL (ref 4.0–10.5)
nRBC: 0 % (ref 0.0–0.2)

## 2019-07-06 LAB — PREPARE RBC (CROSSMATCH)

## 2019-07-06 MED ORDER — SODIUM CHLORIDE 0.9% IV SOLUTION
Freq: Once | INTRAVENOUS | Status: AC
  Administered 2019-07-06: 13:00:00 via INTRAVENOUS

## 2019-07-06 NOTE — Progress Notes (Signed)
PROGRESS NOTE    Wendy Townsend  [id number] DOB: May 08, 1951 DOA: 06/25/2019 PCP: Sandrea Hughs, NP   Brief Narrative: Per HPI 68 year old woman PMH peripheral vascular disease, CKD stage III, stroke PRESENTED to [place] long the emergency department with 2 days of severe foot pain. Ultrasound showed occlusion of left femoral popliteal bypass.  Patient failed thrombolysis and ended up having left above-knee amputation on 07/01/2019. Postop doing well.  Subjective: Resting well.  CR has been denied by insurance.  On room air.   Complains of generalized weakness, but very motivated to work with physical therapy.  Assessment & Plan:   Left lower extremity gangrene status post left AKA 10/12 : Was on Vanco and aztreonam stopped 10/14.  Stable doing well continue pain control with oral and IV opiates.  Continue ASA 81 mg.Continue PT OT and has advised CIR. follow-up with Dr. Donnetta Hutching in 4 weeks for staple removal.  AKI on CKD stage III: Resolved with IV fluids, HCTZ and ACE inhibitor remains on hold.  Essential hypertension: Blood pressure stable. On low-dose Coreg.  HCTZ and ACE inhibitor on hold can resume slowly if blood pressure starts to trend up.  Acute blood loss anemia in the setting of anemia in chronic kidney disease: Hemoglobin at 7.3-7.6 gm. Today.  Previously 5.5 on 10/9 after surgery needing blood transfusion, had received 4 units PRBC so far.  Check CBC alert syndrome program and patient does complain of generalized weakness although motivated to work with PT.  Initiate the patient and she agrees for blood transfusion after explaining risks benefits alternatives.  Recent Labs  Lab 07/02/19 1631 07/03/19 0445 07/04/19 0339 07/05/19 0217 07/06/19 0347  HGB 8.1* 7.8* 7.3* 7.6* 7.4*  HCT 23.0* 22.9* 21.4* 22.5* 22.5*    History of stroke with mild left-sided weakness.continue  aspirin.  Hyponatremia 2/2 dehydration volume depletion and also from HCTZ, resolved with  normal saline.  Off fluids continue oral hydration.  Hypokalemia/hypomagnesemia:Resolved  Hypocalcemia with hypoalbuminemia corrected calcium is stable at 8.3 (calcium at 6.3 and albumin at 1.9). started on calcium vitamin D supplementation.  History of tobacco abuse and substance abuse:Counseling provided previously  Body mass index is 25.23 kg/m.   DVT prophylaxis: SCD.  IV heparin was stopped due to acute drop in hemoglobin previously. Code Status: full Family Communication: plan of care discussed with patient in detail.  No family at bedside.  Reports her family and brothers are aware. Disposition Plan: CIR denied by insurance despite doing p.o. TPA.  Patient does not want to go to skilled nursing facility due to coronavirus.  Reports she will have brothers and family at home all the time will take care of her.  Anticipating discharge next 24 to 48 hours if we can arrange home health services and family available at home and if Hb stabilizes after transfusion  Consultants:  vascular  Procedures: Thrombolysis 06/26/2019 Left below-knee popliteal artery angioplasty 06/27/2019 Left above-knee amputation 07/01/2019 Antimicrobials:vanco and aztreonam stopped 07/03/2019  Antimicrobials: Anti-infectives (From admission, onward)   Start     Dose/Rate Route Frequency Ordered Stop   06/27/19 1500  vancomycin (VANCOCIN) IVPB 750 mg/150 ml premix  Status:  Discontinued     750 mg 150 mL/hr over 60 Minutes Intravenous Every 48 hours 06/25/19 1847 06/27/19 1016   06/27/19 1100  vancomycin (VANCOCIN) IVPB 750 mg/150 ml premix     750 mg 150 mL/hr over 60 Minutes Intravenous Every 24 hours 06/27/19 1016 07/03/19 1201   06/27/19 0800  aztreonam (AZACTAM) 1  g in sodium chloride 0.9 % 100 mL IVPB     1 g 200 mL/hr over 30 Minutes Intravenous Every 8 hours 06/27/19 0611 07/03/19 2359   06/26/19 0600  aztreonam (AZACTAM) 1 g in sodium chloride 0.9 % 100 mL IVPB  Status:  Discontinued     1 g  200 mL/hr over 30 Minutes Intravenous Every 8 hours 06/25/19 1847 06/27/19 0612   06/25/19 1830  aztreonam (AZACTAM) 2 g in sodium chloride 0.9 % 100 mL IVPB     2 g 200 mL/hr over 30 Minutes Intravenous  Once 06/25/19 1807 06/25/19 2131   06/25/19 1445  vancomycin (VANCOCIN) IVPB 1000 mg/200 mL premix     1,000 mg 200 mL/hr over 60 Minutes Intravenous  Once 06/25/19 1441 06/25/19 1604       Objective: Vitals:   07/06/19 0821 07/06/19 1018 07/06/19 1100 07/06/19 1200  BP: (!) 144/89 128/63 124/64 124/60  Pulse: 92 (!) 102    Resp: 12 18 (!) 23 18  Temp: 98.1 F (36.7 C) 99.1 F (37.3 C) 98 F (36.7 C) 99 F (37.2 C)  TempSrc: Oral Oral Oral Oral  SpO2: 100% 96%  95%  Weight:      Height:        Intake/Output Summary (Last 24 hours) at 07/06/2019 1325 Last data filed at 07/06/2019 0939 Gross per 24 hour  Intake 480 ml  Output -  Net 480 ml   Filed Weights   06/25/19 1826  Weight: 66.7 kg   Weight change:   Body mass index is 25.23 kg/m.  Intake/Output from previous day: 10/16 0701 - 10/17 0700 In: 600 [P.O.:600] Out: -  Intake/Output this shift: Total I/O In: 240 [P.O.:240] Out: -   Examination:  General exam: Appears calm and comfo on room air. NAD HEENT:PERRL,Oral mucosa moist, Ear/Nose normal on gross exam Respiratory system: Bilateral equal air entry, normal vesicular breath sounds, no wheezes or crackles  Cardiovascular system: S1 & S2 heard,No JVD, murmurs. Gastrointestinal system: Abdomen is  soft, non tender, non distended, BS +  Nervous System:Alert and oriented. No focal neurological deficits/moving extremities, sensation intact. Extremities: Left leg stump dry, clean, No edema, no clubbing, distal peripheral pulses palpable. Skin: No rashes, lesions, no icterus MSK: Normal muscle bulk,tone ,power  Medications:  Scheduled Meds: . aspirin EC  81 mg Oral Daily  . calcium-vitamin D  2 tablet Oral BID  . carvedilol  3.125 mg Oral BID WC  .  Chlorhexidine Gluconate Cloth  6 each Topical Daily  . saccharomyces boulardii  250 mg Oral BID  . sodium chloride flush  3 mL Intravenous Q12H  . sodium chloride flush  3 mL Intravenous Q12H   Continuous Infusions: . sodium chloride      Data Reviewed: I have personally reviewed following labs and imaging studies  CBC: Recent Labs  Lab 07/02/19 1631 07/03/19 0445 07/04/19 0339 07/05/19 0217 07/06/19 0347  WBC 11.5* 11.4* 9.1 7.8 6.3  HGB 8.1* 7.8* 7.3* 7.6* 7.4*  HCT 23.0* 22.9* 21.4* 22.5* 22.5*  MCV 89.8 89.5 91.1 91.5 93.0  PLT 188 229 262 303 [id number]   Basic Metabolic Panel: Recent Labs  Lab 07/02/19 1631 07/03/19 0445 07/04/19 0339 07/05/19 0217 07/05/19 0853 07/06/19 0347  NA 136 137 139  --  137 138  K 3.5 3.6 3.5  --  3.4* 3.7  CL 104 107 107  --  105 108  CO2 22 22 23   --  23 21*  GLUCOSE 139*  103* 95  --  90 94  BUN 8 6* 7*  --  5* 6*  CREATININE 0.88 0.84 0.86 0.87 0.87 0.87  CALCIUM 6.5* 6.3* 6.3*  --  6.3* 6.6*   GFR: Estimated Creatinine Clearance: 58.9 mL/min (by C-G formula based on SCr of 0.87 mg/dL). Liver Function Tests: Recent Labs  Lab 06/30/19 0510 07/01/19 0458 07/02/19 1631 07/03/19 0445  AST 29 22 16 16   ALT 12 12 9 9   ALKPHOS 70 75 56 61  BILITOT 1.0 0.8 0.8 1.0  PROT 5.3* 5.3* 5.0* 4.9*  ALBUMIN 1.9* 1.9* 1.8* 1.9*   No results for input(s): LIPASE, AMYLASE in the last 168 hours. No results for input(s): AMMONIA in the last 168 hours. Coagulation Profile: No results for input(s): INR, PROTIME in the last 168 hours. Cardiac Enzymes: No results for input(s): CKTOTAL, CKMB, CKMBINDEX, TROPONINI in the last 168 hours. BNP (last 3 results) No results for input(s): PROBNP in the last 8760 hours. HbA1C: No results for input(s): HGBA1C in the last 72 hours. CBG: No results for input(s): GLUCAP in the last 168 hours. Lipid Profile: No results for input(s): CHOL, HDL, LDLCALC, TRIG, CHOLHDL, LDLDIRECT in the last 72 hours. Thyroid  Function Tests: No results for input(s): TSH, T4TOTAL, FREET4, T3FREE, THYROIDAB in the last 72 hours. Anemia Panel: No results for input(s): VITAMINB12, FOLATE, FERRITIN, TIBC, IRON, RETICCTPCT in the last 72 hours. Sepsis Labs: No results for input(s): PROCALCITON, LATICACIDVEN in the last 168 hours.  Recent Results (from the past 240 hour(s))  MRSA PCR Screening     Status: None   Collection Time: 06/28/19  4:29 PM   Specimen: Nasal Mucosa; Nasopharyngeal  Result Value Ref Range Status   MRSA by PCR NEGATIVE NEGATIVE Final    Comment:        The GeneXpert MRSA Assay (FDA approved for NASAL specimens only), is one component of a comprehensive MRSA colonization surveillance program. It is not intended to diagnose MRSA infection nor to guide or monitor treatment for MRSA infections. Performed at [facility], [place] [address], [place], [place] [address]   Urine Culture     Status: None   Collection Time: 06/30/19 12:15 AM   Specimen: Urine, Random  Result Value Ref Range Status   Specimen Description URINE, RANDOM  Final   Special Requests NONE  Final   Culture   Final    NO GROWTH Performed at [facility], [place] [address], [place], [place] [address]    Report Status 07/01/2019 FINAL  Final  Surgical pcr screen     Status: Abnormal   Collection Time: 07/01/19  1:05 AM   Specimen: Nasal Mucosa; Nasal Swab  Result Value Ref Range Status   MRSA, PCR NEGATIVE NEGATIVE Final   Staphylococcus aureus POSITIVE (A) NEGATIVE Final    Comment: (NOTE) The Xpert SA Assay (FDA approved for NASAL specimens in patients 71 years of age and older), is one component of a comprehensive surveillance program. It is not intended to diagnose infection nor to guide or monitor treatment. Performed at [facility], [place] [address], [place], [place] [address]       Radiology Studies: No results found.    LOS: 11 days   Time spent: More than 50% of that time was spent in  counseling and/or coordination of care.  Antonieta Pert, MD [facility]  07/06/2019, 1:25 PM

## 2019-07-06 NOTE — Care Management (Signed)
Spoke w patient at bedside, currently getting blood transfusion.  She states that she lives at home w brother and cousin who do not drive. She has a rollator that she could use to sit on and get around her house at home currently. She will need a wheelchair and it will have to be either picked up byher niece tomorrow or delivered to her house later in the week if she discharges home via [facility] today. She planned on her niece providing transport home, but claims she was told she would not DC until tomorrow and niece is not in town until tomorrow morning.  Please contact me before 4pm if she is getting discharged today. Carles Collet RN [employer] [phone]

## 2019-07-06 NOTE — Progress Notes (Signed)
Status post left above-knee amputation, postop day 5 Her stump is intact and healing appropriately.  Awaiting disposition.  We will reevaluate her on Monday.  Wendy Townsend

## 2019-07-07 LAB — BASIC METABOLIC PANEL
Anion gap: 11 (ref 5–15)
BUN: 6 mg/dL — ABNORMAL LOW (ref 8–23)
CO2: 21 mmol/L — ABNORMAL LOW (ref 22–32)
Calcium: 7.3 mg/dL — ABNORMAL LOW (ref 8.9–10.3)
Chloride: 108 mmol/L (ref 98–111)
Creatinine, Ser: 0.95 mg/dL (ref 0.44–1.00)
GFR calc Af Amer: >60 mL/min (ref >60)
GFR calc non Af Amer: >60 mL/min (ref >60)
Glucose, Bld: 91 mg/dL (ref 70–99)
Potassium: 4.2 mmol/L (ref 3.5–5.1)
Sodium: 140 mmol/L (ref 135–145)

## 2019-07-07 LAB — TYPE AND SCREEN
ABO/RH(D): B POS
Antibody Screen: NEGATIVE
Unit division: 0

## 2019-07-07 LAB — CBC
HCT: 27.5 % — ABNORMAL LOW (ref 36.0–46.0)
Hemoglobin: 9.8 g/dL — ABNORMAL LOW (ref 12.0–15.0)
MCH: 32.1 pg (ref 26.0–34.0)
MCHC: 35.6 g/dL (ref 30.0–36.0)
MCV: 90.2 fL (ref 80.0–100.0)
Platelets: 396 10*3/uL (ref 150–400)
RBC: 3.05 MIL/uL — ABNORMAL LOW (ref 3.87–5.11)
RDW: 15.2 % (ref 11.5–15.5)
WBC: 7.3 10*3/uL (ref 4.0–10.5)
nRBC: 0 % (ref 0.0–0.2)

## 2019-07-07 LAB — BPAM RBC
Blood Product Expiration Date: 202010242359
ISSUE DATE / TIME: 202010171213
Unit Type and Rh: 7300

## 2019-07-07 MED ORDER — CALCIUM CARBONATE-VITAMIN D 500-200 MG-UNIT PO TABS: 1.0000 | ORAL_TABLET | Freq: Two times a day (BID) | ORAL | 0 refills | Status: DC

## 2019-07-07 MED ORDER — TRAMADOL HCL 50 MG PO TABS: 50.0000 mg | ORAL_TABLET | Freq: Four times a day (QID) | ORAL | 0 refills | Status: DC | PRN

## 2019-07-07 NOTE — Discharge Summary (Addendum)
Physician Discharge Summary  Wendy Townsend [id number] DOB: September 21, 1950 DOA: 06/25/2019  PCP: Sandrea Hughs, NP  Admit date: 06/25/2019 Discharge date: 07/08/2019  Admitted From: home Disposition:  home  Recommendations for Outpatient Follow-up:  1. Follow up with PCP in 1-2 weeks 2. Please obtain BMP/CBC in one week 3. Please follow up on the following pending results:  Home Health:yes  Equipment/Devices: DME 3in1 Discharge Condition: Stable CODE STATUS: FULL Diet recommendation: Heart Healthy  Brief/Interim Summary:  68 year old woman PMH peripheral vascular disease, CKD stage III, stroke PRESENTED to [place] long the emergency department with 2 days of severe foot pain. Ultrasound showed occlusion of left femoral popliteal bypass.Patient failed thrombolysis and ended up having left above-knee amputation on 07/01/2019. Postop doing sever anemia needing multiple prbc transfusion. Planned for CIR denied and SNF was advised- she refused and going home with family- arranging [place] Pt has no ride/care giver at home and did not want to go 10/18- is being discharged today at 10/19, prefers oxycodone instead of tramadol.  Discharge Diagnoses:  Principal Problem:   Left foot pain Active Problems:   Ischemic pain of foot, left   AKI (acute kidney injury) ([place])   Dehydration   CKD (chronic kidney disease), stage III   Anemia in chronic kidney disease (CKD)  Left lower extremity gangrene status post left AKA 10/12:Was on Vanco and aztreonam stopped 10/14. Stable doing well continue pain control with oral and IV opiates. Continue ASA 81 mg.Continue PT OT and has advised CIR.follow-up with Dr. Donnetta Hutching in4 weeks for staple removal. Cont hh and pt/ot  AKIon CKDstage [id number] with IV fluids, HCTZ and ACE inhibitor remains on hold.  Essential hypertension:Blood pressure stable. On low-dose Coreg.  HCTZ and ACE inhibitor on hold can resume slowly if blood pressure starts to  trend up.  Acute blood loss anemia in the setting of anemia in chronic kidney disease:Hemoglobin at 7.3-7.6 gm.  After additional 1 unit 10/16- improved to 9.8 gm. Feels very well, no more weakness. had received 5 units PRBC so far.   Recent Labs  Lab 07/03/19 0445 07/04/19 0339 07/05/19 0217 07/06/19 0347 07/07/19 0258  HGB 7.8* 7.3* 7.6* 7.4* 9.8*  HCT 22.9* 21.4* 22.5* 22.5* 27.5*    History of stroke with mild left-sided weakness.continue  aspirin.  Hyponatremia2/2dehydration volume depletion and also from HCTZ,resolved with normal saline.  Off fluids continue oral hydration.  Hypokalemia/hypomagnesemia:Resolved  Hypocalcemia with hypoalbuminemiacorrected calcium is stable at mid 8 range. started on calcium vitamin D supplementation.  History of tobacco abuse and substance abuse:Counseling provided previously  Body mass index is 25.23 kg/m.   DVT prophylaxis: SCD.  IV heparin was stopped due to acute drop in hemoglobin previously. Code Status: full Family Communication: plan of care discussed with patient in detail.family aware about the disposition plan.   Disposition Plan: CIR denied by insurance despite doing p.o. TPA.  Patient does not want to go to skilled nursing facility due to coronavirus.  Reports she will have brothers and family at home all the time will take care of her. D/c home w family w [facility]  Consultants:  vascular  Procedures: Thrombolysis 06/26/2019 Left below-knee popliteal artery angioplasty 06/27/2019 Left above-knee amputation 07/01/2019 Antimicrobials:vanco and aztreonamstopped 07/03/2019   Discharge Instructions  Discharge Instructions    Diet - low sodium heart healthy   Complete by: As directed    Discharge instructions   Complete by: As directed    Please call call MD or return to ER for similar or worsening  recurring problem that brought you to hospital or if any fever,nausea/vomiting,abdominal pain, uncontrolled pain,  chest pain,  shortness of breath or any other alarming symptoms.  See Dr Donnetta Hutching in 4 wk to remove staples  Please follow-up your doctor as instructed in a week time and call the office for appointment.  Please avoid alcohol, smoking, or any other illicit substance and maintain healthy habits including taking your regular medications as prescribed.  You were cared for by a hospitalist during your hospital stay. If you have any questions about your discharge medications or the care you received while you were in the hospital after you are discharged, you can call the unit and ask to speak with the hospitalist on call if the hospitalist that took care of you is not available.  Once you are discharged, your primary care physician will handle any further medical issues. Please note that NO REFILLS for any discharge medications will be authorized once you are discharged, as it is imperative that you return to your primary care physician (or establish a relationship with a primary care physician if you do not have one) for your aftercare needs so that they can reassess your need for medications and monitor your lab values   Increase activity slowly   Complete by: As directed      Allergies as of 07/08/2019      Reactions   Penicillins Swelling   Has patient had a PCN reaction causing immediate rash, facial/tongue/throat swelling, SOB or lightheadedness with hypotension: YES Has patient had a PCN reaction causing severe rash involving mucus membranes or skin necrosis: NO Has patient had a PCN reaction that required hospitalization NO Has patient had a PCN reaction occurring within the last 10 years: NO If all of the above answers are "NO", then may proceed with Cephalosporin use.      Medication List    STOP taking these medications   cloNIDine 0.2 MG tablet Commonly known as: CATAPRES   hydrochlorothiazide 25 MG tablet Commonly known as: HYDRODIURIL   lisinopril 40 MG tablet Commonly known  as: ZESTRIL     TAKE these medications   aspirin 81 MG EC tablet Take 1 tablet (81 mg total) by mouth daily. Swallow whole. Notes to patient: Take tomorrow morning (10/20).   atorvastatin 80 MG tablet Commonly known as: LIPITOR Take 1 tablet (80 mg total) by mouth daily at 6 PM. Notes to patient: Take as you were at home.   calcium-vitamin D 500-200 MG-UNIT tablet Commonly known as: OSCAL WITH D Take 1 tablet by mouth 2 (two) times daily. Notes to patient: Take this evening (10/19).   carvedilol 3.125 MG tablet Commonly known as: COREG Take 1 tablet (3.125 mg total) by mouth 2 (two) times daily with a meal. Notes to patient: Take this evening (10/19).   mineral oil-hydrophilic petrolatum ointment Apply topically 2 (two) times daily. Apply to lower extremities Notes to patient: Take as you were at home.   oxyCODONE 5 MG immediate release tablet Commonly known as: Oxy IR/ROXICODONE Take 1 tablet (5 mg total) by mouth every 8 (eight) hours as needed for up to 15 doses for severe pain.            Durable Medical Equipment  (From admission, onward)         Start     Ordered   07/07/19 1033  For home use only DME lightweight manual wheelchair with seat cushion  Once    Comments: Patient suffers from left  above the knee amputee which impairs their ability to perform daily activities like standing, bathing, and walker in the home.  A rolling walker will not resolve  issue with performing activities of daily living. A wheelchair will allow patient to safely perform daily activities. Patient is not able to propel themselves in the home using a standard weight wheelchair due to weakness, pain. Patient can self propel in the lightweight wheelchair. Length of need lifetime Accessories: elevating leg rests (ELRs), wheel locks, extensions and anti-tippers, back cushion.   07/07/19 1034   07/05/19 1651  For home use only DME 3 n 1  Once    Comments: 3n1 and wheelchair- lightweight    07/05/19 1650         Follow-up Information    Early, Arvilla Meres, MD In 4 weeks.   Specialties: Vascular Surgery, Cardiology Why: Office will call you to arrange your appt (sent) Contact information: [address] [place] [address] [phone]        Ngetich, Nelda Bucks, NP Follow up in 1 week(s).   Specialty: Family Medicine Contact information: [address] [place] [address] [phone]        Nahser, Wonda Cheng, MD .   Specialty: Cardiology Contact information: [address]  [place] [address] [place], [facility] [place] Follow up.   Specialty: Home Health Services Why: Home Health Physical Therapy and Occupational Therapy- local number [phone] will call to arrange appointment Contact information: [place] [address] [phone]          Allergies  Allergen Reactions  . Penicillins Swelling    Has patient had a PCN reaction causing immediate rash, facial/tongue/throat swelling, SOB or lightheadedness with hypotension: YES Has patient had a PCN reaction causing severe rash involving mucus membranes or skin necrosis: NO Has patient had a PCN reaction that required hospitalization NO Has patient had a PCN reaction occurring within the last 10 years: NO If all of the above answers are "NO", then may proceed with Cephalosporin use.   Procedures/Studies: Dg Tibia/fibula Left  Result Date: 06/25/2019 CLINICAL DATA:  Pt with pain to lower extremity. Pt skin is weeping and flaking off in some areas from just below knee down to mid left foot. States she has a history of skin grafts to lower leg EXAM: LEFT TIBIA AND FIBULA - 2 VIEW COMPARISON:  None. FINDINGS: No fracture or bone lesion. Skeletal structures are demineralized. Knee and ankle joints are normally aligned. There are soft tissue defects along the anterolateral aspect of the ankle and lower leg. No radiopaque foreign  body. There scattered vascular calcifications. IMPRESSION: 1. No fracture, bone lesion or evidence of osteomyelitis. Knee and ankle joints normally aligned. 2. Superficial soft tissue defects along the anterolateral ankle and lower leg. Electronically Signed   By: Lajean Manes M.D.   On: 06/25/2019 15:51   Dg Ankle Complete Left  Result Date: 06/25/2019 CLINICAL DATA:  Pt with pain to lower extremity. Pt skin is weeping and flaking off in some areas from just below knee down to mid left foot. States she has a history of skin grafts to lower leg EXAM: LEFT ANKLE COMPLETE - 3+ VIEW COMPARISON:  None. FINDINGS: No fracture or bone lesion. Skeletal structures are diffusely demineralized. No bone resorption to suggest osteomyelitis. Ankle joint normally spaced and aligned.  No arthropathic changes. They were are superficial soft tissue defects along  the anterolateral aspect of the ankle. No soft tissue air. No radiopaque foreign body. IMPRESSION: 1. No fracture, bone lesion or evidence of osteomyelitis. 2. Superficial to soft tissue defects along the anterolateral ankle. Electronically Signed   By: Lajean Manes M.D.   On: 06/25/2019 15:50   Dg Foot Complete Left  Result Date: 06/25/2019 CLINICAL DATA:  Pt with pain to lower extremity. Pt skin is weeping and flaking off in some areas from just below knee down to mid left foot. States she has a history of skin grafts to lower leg EXAM: LEFT FOOT - COMPLETE 3+ VIEW COMPARISON:  None. FINDINGS: No fracture or bone lesion. Skeletal structures are diffusely demineralized. Absent distal phalanges of the second and fifth toes with small tapering middle phalanges. Findings consistent with previous distal toe amputations. There are no areas of bone resorption to suggest osteomyelitis. Joints are normally spaced and aligned. Soft tissues are unremarkable. IMPRESSION: 1. No fracture or acute finding.  No evidence of osteomyelitis. Electronically Signed   By: Lajean Manes  M.D.   On: 06/25/2019 15:48   Vas [place] Lower Extremity Bypass Graft Dupl  Result Date: 06/26/2019 LOWER EXTREMITY ARTERIAL DUPLEX STUDY Indications: Gangrene to left foot and tibial area, and peripheral artery              disease.  Current ABI: Unable to obtain secondary to wounds Limitations: Patient unable to position properly secondary to pain Comparison Study: 10/04/2018 ABI- right =0.65, left= 0.65 Performing Technologist: Maudry Mayhew MHA, RDMS, RVT, RDCS  Examination Guidelines: A complete evaluation includes B-mode imaging, spectral Doppler, color Doppler, and power Doppler as needed of all accessible portions of each vessel. Bilateral testing is considered an integral part of a complete examination. Limited examinations for reoccurring indications may be performed as noted.  Right Graft #1: Right to left femoral-femoral bypass graft +------------------+--------+--------+----------+-------------------+                   PSV cm/sStenosisWaveform  Comments            +------------------+--------+--------+----------+-------------------+ Inflow                                      Unable to visualize +------------------+--------+--------+----------+-------------------+ Prox Anastomosis  45              monophasic                    +------------------+--------+--------+----------+-------------------+ Proximal Graft    72              monophasic                    +------------------+--------+--------+----------+-------------------+ Mid Graft         36              monophasic                    +------------------+--------+--------+----------+-------------------+ Distal Graft      52              monophasic                    +------------------+--------+--------+----------+-------------------+ Distal Anastomosis188             monophasic                    +------------------+--------+--------+----------+-------------------+ Outflow  Unable to visualize +------------------+--------+--------+----------+-------------------+  Left Graft #1: Left femoral-popliteal bypass graft +--------------------+--------+--------+----------+----------------------------+                     PSV cm/sStenosisWaveform  Comments                     +--------------------+--------+--------+----------+----------------------------+ Inflow                                        Unable to visualize          +--------------------+--------+--------+----------+----------------------------+ Proximal Anastomosis16              monophasic                             +--------------------+--------+--------+----------+----------------------------+ Proximal Graft      47              monophasicOccluded just distal to                                                    proximal segment.            +--------------------+--------+--------+----------+----------------------------+ Mid Graft                   occluded                                       +--------------------+--------+--------+----------+----------------------------+ Distal Graft                occluded                                       +--------------------+--------+--------+----------+----------------------------+ Distal Anastomosis          occluded                                       +--------------------+--------+--------+----------+----------------------------+ Outflow                                       Unable to visualize          +--------------------+--------+--------+----------+----------------------------+  Summary: Right: Patent right to left femoral-femoral bypass graft. Left: Color Doppler and pulsed wave Doppler suggest occluded left femoral-popliteal bypass graft at the proximal thigh. Multiple heterogenous areas visualized in the left groin, suggestive of prominent inguinal lymph nodes.  See table(s) above for measurements and  observations. Electronically signed by Harold Barban MD on 06/26/2019 at 6:59:33 AM.    Final    (Echo, Carotid, EGD, Colonoscopy, ERCP)    Subjective: Resting on RA, pain controlled on meds. No acute events overnight  Discharge Exam: Vitals:   07/08/19 0413 07/08/19 0837  BP: (!) 144/73 (!) 136/97  Pulse: 90 91  Resp: 19   Temp: 98.5 F (36.9 C)   SpO2: 100%    Vitals:   07/07/19 1939 07/07/19 1941 07/08/19 0413  07/08/19 0837  BP:  (!) 110/55 (!) 144/73 (!) 136/97  Pulse:  77 90 91  Resp: 17 14 19    Temp:  98.1 F (36.7 C) 98.5 F (36.9 C)   TempSrc:  Oral Oral   SpO2:  99% 100%   Weight:      Height:        General: Pt is alert, awake, not in acute distress Cardiovascular: RRR, S1/S2 +, no rubs, no gallops Respiratory: CTA bilaterally, no wheezing, no rhonchi Abdominal: Soft, NT, ND, bowel sounds + Extremities: no edema, no cyanosis left AKA, stump with intact stable   The results of significant diagnostics from this hospitalization (including imaging, microbiology, ancillary and laboratory) are listed below for reference.     Microbiology: Recent Results (from the past 240 hour(s))  MRSA PCR Screening     Status: None   Collection Time: 06/28/19  4:29 PM   Specimen: Nasal Mucosa; Nasopharyngeal  Result Value Ref Range Status   MRSA by PCR NEGATIVE NEGATIVE Final    Comment:        The GeneXpert MRSA Assay (FDA approved for NASAL specimens only), is one component of a comprehensive MRSA colonization surveillance program. It is not intended to diagnose MRSA infection nor to guide or monitor treatment for MRSA infections. Performed at [facility], [place] [address], [place], [place] [address]   Urine Culture     Status: None   Collection Time: 06/30/19 12:15 AM   Specimen: Urine, Random  Result Value Ref Range Status   Specimen Description URINE, RANDOM  Final   Special Requests NONE  Final   Culture   Final    NO GROWTH Performed at [facility], [place] [address], [place], [place] [address]    Report Status 07/01/2019 FINAL  Final  Surgical pcr screen     Status: Abnormal   Collection Time: 07/01/19  1:05 AM   Specimen: Nasal Mucosa; Nasal Swab  Result Value Ref Range Status   MRSA, PCR NEGATIVE NEGATIVE Final   Staphylococcus aureus POSITIVE (A) NEGATIVE Final    Comment: (NOTE) The Xpert SA Assay (FDA approved for NASAL specimens in patients 34 years of age and older), is one component of a comprehensive surveillance program. It is not intended to diagnose infection nor to guide or monitor treatment. Performed at [facility], [place] [address], [place], [place] [address]      Labs: BNP (last 3 results) No results for input(s): BNP in the last 8760 hours. Basic Metabolic Panel: Recent Labs  Lab 07/03/19 0445 07/04/19 0339 07/05/19 0217 07/05/19 0853 07/06/19 0347 07/07/19 0258  NA 137 139  --  137 138 140  K 3.6 3.5  --  3.4* 3.7 4.2  CL 107 107  --  105 108 108  CO2 22 23  --  23 21* 21*  GLUCOSE 103* 95  --  90 94 91  BUN 6* 7*  --  5* 6* 6*  CREATININE 0.84 0.86 0.87 0.87 0.87 0.95  CALCIUM 6.3* 6.3*  --  6.3* 6.6* 7.3*   Liver Function Tests: Recent Labs  Lab 07/02/19 1631 07/03/19 0445  AST 16 16  ALT 9 9  ALKPHOS 56 61  BILITOT 0.8 1.0  PROT 5.0* 4.9*  ALBUMIN 1.8* 1.9*   No results for input(s): LIPASE, AMYLASE in the last 168 hours. No results for input(s): AMMONIA in the last 168 hours. CBC: Recent Labs  Lab 07/03/19 0445 07/04/19  [id number] 07/05/19 0217 07/06/19 0347 07/07/19 0258  WBC 11.4* 9.1 7.8 6.3 7.3  HGB 7.8* 7.3* 7.6* 7.4* 9.8*  HCT 22.9* 21.4* 22.5* 22.5* 27.5*  MCV 89.5 91.1 91.5 93.0 90.2  PLT 229 262 303 372 396   Cardiac Enzymes: No results for input(s): CKTOTAL, CKMB, CKMBINDEX, TROPONINI in the last 168 hours. BNP: Invalid input(s): POCBNP CBG: No results for input(s): GLUCAP in the last 168 hours. D-Dimer No results for input(s): DDIMER in the last 72  hours. Hgb A1c No results for input(s): HGBA1C in the last 72 hours. Lipid Profile No results for input(s): CHOL, HDL, LDLCALC, TRIG, CHOLHDL, LDLDIRECT in the last 72 hours. Thyroid function studies No results for input(s): TSH, T4TOTAL, T3FREE, THYROIDAB in the last 72 hours.  Invalid input(s): FREET3 Anemia work up No results for input(s): VITAMINB12, FOLATE, FERRITIN, TIBC, IRON, RETICCTPCT in the last 72 hours. Urinalysis    Component Value Date/Time   COLORURINE YELLOW 06/30/2019 0001   APPEARANCEUR CLEAR 06/30/2019 0001   LABSPEC 1.015 06/30/2019 0001   PHURINE 5.0 06/30/2019 0001   GLUCOSEU NEGATIVE 06/30/2019 0001   HGBUR NEGATIVE 06/30/2019 0001   BILIRUBINUR NEGATIVE 06/30/2019 0001   KETONESUR NEGATIVE 06/30/2019 0001   PROTEINUR NEGATIVE 06/30/2019 0001   UROBILINOGEN 0.2 06/21/2008 2147   NITRITE NEGATIVE 06/30/2019 0001   LEUKOCYTESUR NEGATIVE 06/30/2019 0001   Sepsis Labs Invalid input(s): PROCALCITONIN,  WBC,  LACTICIDVEN Microbiology Recent Results (from the past 240 hour(s))  MRSA PCR Screening     Status: None   Collection Time: 06/28/19  4:29 PM   Specimen: Nasal Mucosa; Nasopharyngeal  Result Value Ref Range Status   MRSA by PCR NEGATIVE NEGATIVE Final    Comment:        The GeneXpert MRSA Assay (FDA approved for NASAL specimens only), is one component of a comprehensive MRSA colonization surveillance program. It is not intended to diagnose MRSA infection nor to guide or monitor treatment for MRSA infections. Performed at [facility], [place] [address] [place], [place] [address]   Urine Culture     Status: None   Collection Time: 06/30/19 12:15 AM   Specimen: Urine, Random  Result Value Ref Range Status   Specimen Description URINE, RANDOM  Final   Special Requests NONE  Final   Culture   Final    NO GROWTH Performed at [facility], [place] [address] [place], [place] [address]    Report Status 07/01/2019 FINAL  Final  Surgical  pcr screen     Status: Abnormal   Collection Time: 07/01/19  1:05 AM   Specimen: Nasal Mucosa; Nasal Swab  Result Value Ref Range Status   MRSA, PCR NEGATIVE NEGATIVE Final   Staphylococcus aureus POSITIVE (A) NEGATIVE Final    Comment: (NOTE) The Xpert SA Assay (FDA approved for NASAL specimens in patients 66 years of age and older), is one component of a comprehensive surveillance program. It is not intended to diagnose infection nor to guide or monitor treatment. Performed at [facility], [place] [address], [place], [place] [address]      Time coordinating discharge: 25 minutes  SIGNED:   Antonieta Pert, MD  [facility] 07/08/2019, 9:01 AM  If 7PM-7AM, please contact night-coverage [email or web address]

## 2019-07-07 NOTE — TOC Transition Note (Addendum)
Transition of Care [facility]) - CM/SW Discharge Note   Patient Details  Name: Wendy Townsend MRN: [id number] Date of Birth: [birth year]  [facility] CM/SW Contact:  Erenest Rasher, RN Phone Number: [phone] 07/07/2019, 10:41 AM   Clinical Narrative:     Pt prefers to transport by [facility], but does not have her DME. Contacted [place] [employer] rep to see if they can have DME delivered to her home today. He states it will be today or in the am. Contacted [facility] to make aware of dc home today with HH.   Pt's DME (wheelchair, and 3n1 bedside commode) was delivered to her home by [facility]. Pt has declined to dc home by [facility]. States her niece is her caregiver and will not be back in town until Monday, 07/08/2019. Pt wants to dc on 10/19 wne she will have assistance in the home. TOC CM sent message to attending.   Attempted contact to niece, left HIPAA compliant voice message for return call.   Final next level of care: [place] Barriers to Discharge: Equipment Delay   Patient Goals and CMS Choice Patient states their goals for this hospitalization and ongoing recovery are:: Pt wants to go home with home health CMS Medicare.gov Compare Post Acute Care list provided to:: Patient Choice offered to / list presented to : Patient  Discharge Placement                       Discharge Plan and Services In-house Referral: Clinical Social Work Discharge Planning Services: NA Post Acute Care Choice: Home Health          DME Arranged: 3-N-1, Wheelchair manual DME Agency: [employer] Date DME Agency Contacted: 07/05/19 Time DME Agency Contacted: [phone] Representative spoke with at DME Agency: Thedore Mins HH Arranged: OT, PT [place] Agency: [facility] Date [place]: 07/05/19 Time [place]: [id number] Representative spoke with at [place]: [place] ([place]) Interventions     Readmission Risk  Interventions No flowsheet data found.

## 2019-07-08 ENCOUNTER — Other Ambulatory Visit: Payer: Self-pay | Admitting: Family

## 2019-07-08 DIAGNOSIS — K5901 Slow transit constipation: Secondary | ICD-10-CM

## 2019-07-08 MED ORDER — OXYCODONE HCL 5 MG PO TABS: 5.0000 mg | ORAL_TABLET | Freq: Three times a day (TID) | ORAL | 0 refills | Status: DC | PRN

## 2019-07-08 NOTE — Progress Notes (Signed)
Physical Therapy Treatment Patient Details Name: Wendy Townsend MRN: [id number] DOB: [birth year] Today's Date: 07/08/2019    History of Present Illness Pt is a 68 y/o female with pmh significant for PVD, CKD3, stroke with mild L residual weakness, admitted to [facility] with 2 days of severe foot pain.  US showed occlusion of left fem pop BPG.  Vascular surgery ultimately failed with BPGing which led to L AKA.    PT Comments    Pt progressing well with mobility. She required min guard assist bed mobility and transfers with RW. Pt performed LE exercises in recliner with foot elevated. Pt's insurance denied CIR. Discharge recommendations updated to HHPT.    Follow Up Recommendations  Home health PT;Supervision/Assistance - 24 hour     Equipment Recommendations  Wheelchair (measurements PT);Wheelchair cushion (measurements PT)    Recommendations for Other Services       Precautions / Restrictions Precautions Precautions: Fall Precaution Comments: left AKA - staples intact Restrictions LLE Weight Bearing: Non weight bearing    Mobility  Bed Mobility Overal bed mobility: Needs Assistance Bed Mobility: Supine to Sit     Supine to sit: HOB elevated;Min guard     General bed mobility comments: +rail, min guard for safety  Transfers Overall transfer level: Needs assistance Equipment used: Rolling walker (2 wheeled) Transfers: Sit to/from [facility] Sit to Stand: Min guard;From elevated surface Stand pivot transfers: Min guard       General transfer comment: min guard for safety, good technique  Ambulation/Gait                 Stairs             Wheelchair Mobility    Modified Rankin (Stroke Patients Only)       Balance Overall balance assessment: Needs assistance Sitting-balance support: Feet supported;No upper extremity supported Sitting balance-Leahy Scale: Good     Standing balance support: During functional activity;Bilateral  upper extremity supported Standing balance-Leahy Scale: Poor Standing balance comment: reliant on UE support                            Cognition Arousal/Alertness: Awake/alert Behavior During Therapy: WFL for tasks assessed/performed Overall Cognitive Status: Within Functional Limits for tasks assessed                                        Exercises General Exercises - Lower Extremity Ankle Circles/Pumps: AROM;10 reps;Right;Supine Heel Slides: AROM;Right;10 reps;Supine Amputee Exercises Gluteal Sets: AROM;Both;5 reps;Seated Hip ABduction/ADduction: AROM;Right;Left;10 reps;Supine    General Comments        Pertinent Vitals/Pain Pain Assessment: Faces Faces Pain Scale: Hurts little more Pain Location: left residual limb Pain Descriptors / Indicators: Sore;Grimacing Pain Intervention(s): Repositioned;Monitored during session    Home Living                      Prior Function            PT Goals (current goals can now be found in the care plan section) Acute Rehab PT Goals Patient Stated Goal: I want to be active and Independent PT Goal Formulation: With patient Time For Goal Achievement: 07/16/19 Potential to Achieve Goals: Good Progress towards PT goals: Progressing toward goals    Frequency    Min 4X/week      PT Plan Discharge plan  needs to be updated    Co-evaluation              AM-PAC PT "6 Clicks" Mobility   Outcome Measure  Help needed turning from your back to your side while in a flat bed without using bedrails?: None Help needed moving from lying on your back to sitting on the side of a flat bed without using bedrails?: A Little Help needed moving to and from a bed to a chair (including a wheelchair)?: A Little Help needed standing up from a chair using your arms (e.g., wheelchair or bedside chair)?: A Little Help needed to walk in hospital room?: A Little Help needed climbing 3-5 steps with a  railing? : Total 6 Click Score: 17    End of Session Equipment Utilized During Treatment: Gait belt Activity Tolerance: Patient tolerated treatment well Patient left: in chair;with chair alarm set;with call bell/phone within reach Nurse Communication: Mobility status PT Visit Diagnosis: Other abnormalities of gait and mobility (R26.89);Pain;Difficulty in walking, not elsewhere classified (R26.2) Pain - Right/Left: Left Pain - part of body: Leg     Time: [id number] PT Time Calculation (min) (ACUTE ONLY): 13 min  Charges:  $Therapeutic Activity: 8-22 mins                     Lorrin Goodell, PT  Office # [phone] Pager [phone]    Lorriane Shire 07/08/2019, 10:59 AM

## 2019-07-08 NOTE — Care Management Important Message (Signed)
Important Message  Patient Details  Name: Wendy Townsend MRN: [id number] Date of Birth: 1950/11/02   Medicare Important Message Given:  Yes     Shelda Altes 07/08/2019, 1:32 PM

## 2019-07-08 NOTE — Progress Notes (Signed)
TOC CM received call back from niece stating she will pick pt up at 1 pm. Jonnie Finner RN [place], [place] [facility] TOC CM [phone]

## 2019-07-08 NOTE — Progress Notes (Signed)
Pt's pharmacy, [facility] on [address], contacted and informed to cancel order for Tramadol, per MD order. Spoke with pharmacist, Nicki Reaper. Pt will receive Oxycodone instead, per pt's request. See discharge orders.

## 2019-07-08 NOTE — Progress Notes (Signed)
Discharge instructions given to patient. Medications reviewed and all questions answered. IV removed, clean and intact. Patient discharged home with niece.   Arletta Bale, RN

## 2019-07-09 ENCOUNTER — Telehealth: Payer: Self-pay | Admitting: *Deleted

## 2019-07-09 DIAGNOSIS — E785 Hyperlipidemia, unspecified: Secondary | ICD-10-CM | POA: Diagnosis not present

## 2019-07-09 DIAGNOSIS — N183 Chronic kidney disease, stage 3 unspecified: Secondary | ICD-10-CM | POA: Diagnosis not present

## 2019-07-09 DIAGNOSIS — D631 Anemia in chronic kidney disease: Secondary | ICD-10-CM | POA: Diagnosis not present

## 2019-07-09 DIAGNOSIS — Z89612 Acquired absence of left leg above knee: Secondary | ICD-10-CM | POA: Diagnosis not present

## 2019-07-09 DIAGNOSIS — Z4781 Encounter for orthopedic aftercare following surgical amputation: Secondary | ICD-10-CM | POA: Diagnosis not present

## 2019-07-09 DIAGNOSIS — I2510 Atherosclerotic heart disease of native coronary artery without angina pectoris: Secondary | ICD-10-CM | POA: Diagnosis not present

## 2019-07-09 DIAGNOSIS — I69354 Hemiplegia and hemiparesis following cerebral infarction affecting left non-dominant side: Secondary | ICD-10-CM | POA: Diagnosis not present

## 2019-07-09 DIAGNOSIS — E43 Unspecified severe protein-calorie malnutrition: Secondary | ICD-10-CM | POA: Diagnosis not present

## 2019-07-09 DIAGNOSIS — I129 Hypertensive chronic kidney disease with stage 1 through stage 4 chronic kidney disease, or unspecified chronic kidney disease: Secondary | ICD-10-CM | POA: Diagnosis not present

## 2019-07-09 DIAGNOSIS — I739 Peripheral vascular disease, unspecified: Secondary | ICD-10-CM | POA: Diagnosis not present

## 2019-07-09 NOTE — Telephone Encounter (Signed)
I have made the 1st attempt to contact the patient or family member in charge, in order to follow up from recently being discharged from the hospital. I left a message on voicemail but I will make another attempt at a different time.  

## 2019-07-10 NOTE — Telephone Encounter (Signed)
I have made the 2nd attempt to contact the patient or family member in charge, in order to follow up from recently being discharged from the hospital. I left a message on voicemail but I will make another attempt at a different time.  

## 2019-07-11 DIAGNOSIS — I69354 Hemiplegia and hemiparesis following cerebral infarction affecting left non-dominant side: Secondary | ICD-10-CM | POA: Diagnosis not present

## 2019-07-11 DIAGNOSIS — I129 Hypertensive chronic kidney disease with stage 1 through stage 4 chronic kidney disease, or unspecified chronic kidney disease: Secondary | ICD-10-CM | POA: Diagnosis not present

## 2019-07-11 DIAGNOSIS — Z4781 Encounter for orthopedic aftercare following surgical amputation: Secondary | ICD-10-CM | POA: Diagnosis not present

## 2019-07-11 DIAGNOSIS — E43 Unspecified severe protein-calorie malnutrition: Secondary | ICD-10-CM | POA: Diagnosis not present

## 2019-07-11 DIAGNOSIS — N183 Chronic kidney disease, stage 3 unspecified: Secondary | ICD-10-CM | POA: Diagnosis not present

## 2019-07-11 DIAGNOSIS — I2510 Atherosclerotic heart disease of native coronary artery without angina pectoris: Secondary | ICD-10-CM | POA: Diagnosis not present

## 2019-07-11 DIAGNOSIS — Z89612 Acquired absence of left leg above knee: Secondary | ICD-10-CM | POA: Diagnosis not present

## 2019-07-11 DIAGNOSIS — I739 Peripheral vascular disease, unspecified: Secondary | ICD-10-CM | POA: Diagnosis not present

## 2019-07-11 DIAGNOSIS — D631 Anemia in chronic kidney disease: Secondary | ICD-10-CM | POA: Diagnosis not present

## 2019-07-11 DIAGNOSIS — E785 Hyperlipidemia, unspecified: Secondary | ICD-10-CM | POA: Diagnosis not present

## 2019-07-12 DIAGNOSIS — Z89612 Acquired absence of left leg above knee: Secondary | ICD-10-CM | POA: Diagnosis not present

## 2019-07-12 DIAGNOSIS — E43 Unspecified severe protein-calorie malnutrition: Secondary | ICD-10-CM | POA: Diagnosis not present

## 2019-07-12 DIAGNOSIS — I2510 Atherosclerotic heart disease of native coronary artery without angina pectoris: Secondary | ICD-10-CM | POA: Diagnosis not present

## 2019-07-12 DIAGNOSIS — Z4781 Encounter for orthopedic aftercare following surgical amputation: Secondary | ICD-10-CM | POA: Diagnosis not present

## 2019-07-12 DIAGNOSIS — E785 Hyperlipidemia, unspecified: Secondary | ICD-10-CM | POA: Diagnosis not present

## 2019-07-12 DIAGNOSIS — I739 Peripheral vascular disease, unspecified: Secondary | ICD-10-CM | POA: Diagnosis not present

## 2019-07-12 DIAGNOSIS — I129 Hypertensive chronic kidney disease with stage 1 through stage 4 chronic kidney disease, or unspecified chronic kidney disease: Secondary | ICD-10-CM | POA: Diagnosis not present

## 2019-07-12 DIAGNOSIS — D631 Anemia in chronic kidney disease: Secondary | ICD-10-CM | POA: Diagnosis not present

## 2019-07-12 DIAGNOSIS — I69354 Hemiplegia and hemiparesis following cerebral infarction affecting left non-dominant side: Secondary | ICD-10-CM | POA: Diagnosis not present

## 2019-07-12 DIAGNOSIS — N183 Chronic kidney disease, stage 3 unspecified: Secondary | ICD-10-CM | POA: Diagnosis not present

## 2019-07-14 DIAGNOSIS — I69354 Hemiplegia and hemiparesis following cerebral infarction affecting left non-dominant side: Secondary | ICD-10-CM | POA: Diagnosis not present

## 2019-07-14 DIAGNOSIS — Z89612 Acquired absence of left leg above knee: Secondary | ICD-10-CM | POA: Diagnosis not present

## 2019-07-14 DIAGNOSIS — D631 Anemia in chronic kidney disease: Secondary | ICD-10-CM | POA: Diagnosis not present

## 2019-07-14 DIAGNOSIS — I2510 Atherosclerotic heart disease of native coronary artery without angina pectoris: Secondary | ICD-10-CM | POA: Diagnosis not present

## 2019-07-14 DIAGNOSIS — I739 Peripheral vascular disease, unspecified: Secondary | ICD-10-CM | POA: Diagnosis not present

## 2019-07-14 DIAGNOSIS — I129 Hypertensive chronic kidney disease with stage 1 through stage 4 chronic kidney disease, or unspecified chronic kidney disease: Secondary | ICD-10-CM | POA: Diagnosis not present

## 2019-07-14 DIAGNOSIS — Z4781 Encounter for orthopedic aftercare following surgical amputation: Secondary | ICD-10-CM | POA: Diagnosis not present

## 2019-07-14 DIAGNOSIS — E43 Unspecified severe protein-calorie malnutrition: Secondary | ICD-10-CM | POA: Diagnosis not present

## 2019-07-14 DIAGNOSIS — N183 Chronic kidney disease, stage 3 unspecified: Secondary | ICD-10-CM | POA: Diagnosis not present

## 2019-07-14 DIAGNOSIS — E785 Hyperlipidemia, unspecified: Secondary | ICD-10-CM | POA: Diagnosis not present

## 2019-07-15 DIAGNOSIS — I2510 Atherosclerotic heart disease of native coronary artery without angina pectoris: Secondary | ICD-10-CM | POA: Diagnosis not present

## 2019-07-15 DIAGNOSIS — I739 Peripheral vascular disease, unspecified: Secondary | ICD-10-CM | POA: Diagnosis not present

## 2019-07-15 DIAGNOSIS — I69354 Hemiplegia and hemiparesis following cerebral infarction affecting left non-dominant side: Secondary | ICD-10-CM | POA: Diagnosis not present

## 2019-07-15 DIAGNOSIS — D631 Anemia in chronic kidney disease: Secondary | ICD-10-CM | POA: Diagnosis not present

## 2019-07-15 DIAGNOSIS — I129 Hypertensive chronic kidney disease with stage 1 through stage 4 chronic kidney disease, or unspecified chronic kidney disease: Secondary | ICD-10-CM | POA: Diagnosis not present

## 2019-07-15 DIAGNOSIS — N183 Chronic kidney disease, stage 3 unspecified: Secondary | ICD-10-CM | POA: Diagnosis not present

## 2019-07-15 DIAGNOSIS — E43 Unspecified severe protein-calorie malnutrition: Secondary | ICD-10-CM | POA: Diagnosis not present

## 2019-07-15 DIAGNOSIS — Z89612 Acquired absence of left leg above knee: Secondary | ICD-10-CM | POA: Diagnosis not present

## 2019-07-15 DIAGNOSIS — E785 Hyperlipidemia, unspecified: Secondary | ICD-10-CM | POA: Diagnosis not present

## 2019-07-15 DIAGNOSIS — Z4781 Encounter for orthopedic aftercare following surgical amputation: Secondary | ICD-10-CM | POA: Diagnosis not present

## 2019-07-17 DIAGNOSIS — E785 Hyperlipidemia, unspecified: Secondary | ICD-10-CM | POA: Diagnosis not present

## 2019-07-17 DIAGNOSIS — I2510 Atherosclerotic heart disease of native coronary artery without angina pectoris: Secondary | ICD-10-CM | POA: Diagnosis not present

## 2019-07-17 DIAGNOSIS — Z4781 Encounter for orthopedic aftercare following surgical amputation: Secondary | ICD-10-CM | POA: Diagnosis not present

## 2019-07-17 DIAGNOSIS — N183 Chronic kidney disease, stage 3 unspecified: Secondary | ICD-10-CM | POA: Diagnosis not present

## 2019-07-17 DIAGNOSIS — E43 Unspecified severe protein-calorie malnutrition: Secondary | ICD-10-CM | POA: Diagnosis not present

## 2019-07-17 DIAGNOSIS — Z89612 Acquired absence of left leg above knee: Secondary | ICD-10-CM | POA: Diagnosis not present

## 2019-07-17 DIAGNOSIS — D631 Anemia in chronic kidney disease: Secondary | ICD-10-CM | POA: Diagnosis not present

## 2019-07-17 DIAGNOSIS — I69354 Hemiplegia and hemiparesis following cerebral infarction affecting left non-dominant side: Secondary | ICD-10-CM | POA: Diagnosis not present

## 2019-07-17 DIAGNOSIS — I739 Peripheral vascular disease, unspecified: Secondary | ICD-10-CM | POA: Diagnosis not present

## 2019-07-17 DIAGNOSIS — I129 Hypertensive chronic kidney disease with stage 1 through stage 4 chronic kidney disease, or unspecified chronic kidney disease: Secondary | ICD-10-CM | POA: Diagnosis not present

## 2019-07-18 DIAGNOSIS — Z4781 Encounter for orthopedic aftercare following surgical amputation: Secondary | ICD-10-CM | POA: Diagnosis not present

## 2019-07-18 DIAGNOSIS — I739 Peripheral vascular disease, unspecified: Secondary | ICD-10-CM | POA: Diagnosis not present

## 2019-07-18 DIAGNOSIS — E785 Hyperlipidemia, unspecified: Secondary | ICD-10-CM | POA: Diagnosis not present

## 2019-07-18 DIAGNOSIS — I129 Hypertensive chronic kidney disease with stage 1 through stage 4 chronic kidney disease, or unspecified chronic kidney disease: Secondary | ICD-10-CM | POA: Diagnosis not present

## 2019-07-18 DIAGNOSIS — E43 Unspecified severe protein-calorie malnutrition: Secondary | ICD-10-CM | POA: Diagnosis not present

## 2019-07-18 DIAGNOSIS — N183 Chronic kidney disease, stage 3 unspecified: Secondary | ICD-10-CM | POA: Diagnosis not present

## 2019-07-18 DIAGNOSIS — D631 Anemia in chronic kidney disease: Secondary | ICD-10-CM | POA: Diagnosis not present

## 2019-07-18 DIAGNOSIS — I2510 Atherosclerotic heart disease of native coronary artery without angina pectoris: Secondary | ICD-10-CM | POA: Diagnosis not present

## 2019-07-18 DIAGNOSIS — I69354 Hemiplegia and hemiparesis following cerebral infarction affecting left non-dominant side: Secondary | ICD-10-CM | POA: Diagnosis not present

## 2019-07-18 DIAGNOSIS — Z89612 Acquired absence of left leg above knee: Secondary | ICD-10-CM | POA: Diagnosis not present

## 2019-07-19 DIAGNOSIS — D631 Anemia in chronic kidney disease: Secondary | ICD-10-CM | POA: Diagnosis not present

## 2019-07-19 DIAGNOSIS — Z89612 Acquired absence of left leg above knee: Secondary | ICD-10-CM | POA: Diagnosis not present

## 2019-07-19 DIAGNOSIS — I739 Peripheral vascular disease, unspecified: Secondary | ICD-10-CM | POA: Diagnosis not present

## 2019-07-19 DIAGNOSIS — I69354 Hemiplegia and hemiparesis following cerebral infarction affecting left non-dominant side: Secondary | ICD-10-CM | POA: Diagnosis not present

## 2019-07-19 DIAGNOSIS — I129 Hypertensive chronic kidney disease with stage 1 through stage 4 chronic kidney disease, or unspecified chronic kidney disease: Secondary | ICD-10-CM | POA: Diagnosis not present

## 2019-07-19 DIAGNOSIS — E43 Unspecified severe protein-calorie malnutrition: Secondary | ICD-10-CM | POA: Diagnosis not present

## 2019-07-19 DIAGNOSIS — I2510 Atherosclerotic heart disease of native coronary artery without angina pectoris: Secondary | ICD-10-CM | POA: Diagnosis not present

## 2019-07-19 DIAGNOSIS — Z4781 Encounter for orthopedic aftercare following surgical amputation: Secondary | ICD-10-CM | POA: Diagnosis not present

## 2019-07-19 DIAGNOSIS — N183 Chronic kidney disease, stage 3 unspecified: Secondary | ICD-10-CM | POA: Diagnosis not present

## 2019-07-19 DIAGNOSIS — E785 Hyperlipidemia, unspecified: Secondary | ICD-10-CM | POA: Diagnosis not present

## 2019-07-19 NOTE — Telephone Encounter (Signed)
Transition Care Management Follow-up Telephone Call  Date of discharge and from where: 07/08/2019 [place]  How have you been since you were released from the hospital? Getting along Any questions or concerns? Social Worker is working on Orthoptist up and some one is building her a ramp  Items Reviewed:  Did the pt receive and understand the discharge instructions provided? Yes   Medications obtained and verified? Yes   Any new allergies since your discharge? No   Dietary orders reviewed? Yes  Do you have support at home? Yes  Family  Other (ie: DME, Home Health, etc) Home health and Social Worker  Functional Questionnaire: (I = Independent and D = Dependent) ADL's: I with Assistance  Bathing/Dressing- I with Assistance   Meal Prep- D  Eating- I  Maintaining continence- I  Transferring/Ambulation- I with Assistance  Managing Meds- I   Follow up appointments reviewed:    PCP Hospital f/u appt confirmed? Yes  Scheduled to see Dinah on 11/3 .  [facility] f/u appt confirmed? No    Are transportation arrangements needed? Yes  Social Worker is setting up transportation   If their condition worsens, is the pt aware to call  their PCP or go to the ED? Yes  Was the patient provided with contact information for the PCP's office or ED? Yes  Was the pt encouraged to call back with questions or concerns? Yes

## 2019-07-22 DIAGNOSIS — I2510 Atherosclerotic heart disease of native coronary artery without angina pectoris: Secondary | ICD-10-CM | POA: Diagnosis not present

## 2019-07-22 DIAGNOSIS — D631 Anemia in chronic kidney disease: Secondary | ICD-10-CM | POA: Diagnosis not present

## 2019-07-22 DIAGNOSIS — Z4781 Encounter for orthopedic aftercare following surgical amputation: Secondary | ICD-10-CM | POA: Diagnosis not present

## 2019-07-22 DIAGNOSIS — I69354 Hemiplegia and hemiparesis following cerebral infarction affecting left non-dominant side: Secondary | ICD-10-CM | POA: Diagnosis not present

## 2019-07-22 DIAGNOSIS — I129 Hypertensive chronic kidney disease with stage 1 through stage 4 chronic kidney disease, or unspecified chronic kidney disease: Secondary | ICD-10-CM | POA: Diagnosis not present

## 2019-07-22 DIAGNOSIS — E43 Unspecified severe protein-calorie malnutrition: Secondary | ICD-10-CM | POA: Diagnosis not present

## 2019-07-22 DIAGNOSIS — N183 Chronic kidney disease, stage 3 unspecified: Secondary | ICD-10-CM | POA: Diagnosis not present

## 2019-07-22 DIAGNOSIS — Z89612 Acquired absence of left leg above knee: Secondary | ICD-10-CM | POA: Diagnosis not present

## 2019-07-22 DIAGNOSIS — E785 Hyperlipidemia, unspecified: Secondary | ICD-10-CM | POA: Diagnosis not present

## 2019-07-22 DIAGNOSIS — I739 Peripheral vascular disease, unspecified: Secondary | ICD-10-CM | POA: Diagnosis not present

## 2019-07-23 ENCOUNTER — Other Ambulatory Visit: Payer: Self-pay

## 2019-07-23 ENCOUNTER — Encounter: Payer: Self-pay | Admitting: Family

## 2019-07-23 ENCOUNTER — Ambulatory Visit (INDEPENDENT_AMBULATORY_CARE_PROVIDER_SITE_OTHER): Payer: Medicare HMO | Admitting: Family

## 2019-07-23 DIAGNOSIS — I739 Peripheral vascular disease, unspecified: Secondary | ICD-10-CM | POA: Diagnosis not present

## 2019-07-23 DIAGNOSIS — E43 Unspecified severe protein-calorie malnutrition: Secondary | ICD-10-CM | POA: Diagnosis not present

## 2019-07-23 DIAGNOSIS — D638 Anemia in other chronic diseases classified elsewhere: Secondary | ICD-10-CM

## 2019-07-23 DIAGNOSIS — I2510 Atherosclerotic heart disease of native coronary artery without angina pectoris: Secondary | ICD-10-CM | POA: Diagnosis not present

## 2019-07-23 DIAGNOSIS — I69354 Hemiplegia and hemiparesis following cerebral infarction affecting left non-dominant side: Secondary | ICD-10-CM | POA: Diagnosis not present

## 2019-07-23 DIAGNOSIS — I10 Essential (primary) hypertension: Secondary | ICD-10-CM

## 2019-07-23 DIAGNOSIS — Z4781 Encounter for orthopedic aftercare following surgical amputation: Secondary | ICD-10-CM | POA: Diagnosis not present

## 2019-07-23 DIAGNOSIS — E785 Hyperlipidemia, unspecified: Secondary | ICD-10-CM | POA: Diagnosis not present

## 2019-07-23 DIAGNOSIS — N183 Chronic kidney disease, stage 3 unspecified: Secondary | ICD-10-CM | POA: Diagnosis not present

## 2019-07-23 DIAGNOSIS — D631 Anemia in chronic kidney disease: Secondary | ICD-10-CM | POA: Diagnosis not present

## 2019-07-23 DIAGNOSIS — I129 Hypertensive chronic kidney disease with stage 1 through stage 4 chronic kidney disease, or unspecified chronic kidney disease: Secondary | ICD-10-CM | POA: Diagnosis not present

## 2019-07-23 DIAGNOSIS — Z89612 Acquired absence of left leg above knee: Secondary | ICD-10-CM

## 2019-07-23 MED ORDER — UNABLE TO FIND: 0 refills | Status: DC

## 2019-07-23 MED ORDER — OXYCODONE HCL 5 MG PO TABS: 5.0000 mg | ORAL_TABLET | Freq: Three times a day (TID) | ORAL | 0 refills | Status: AC | PRN

## 2019-07-23 NOTE — Progress Notes (Addendum)
This service is provided via telemedicine  No vital signs collected/recorded due to the encounter was a telemedicine visit.   Location of patient (ex: home, work):  Home   Patient consents to a telephone visit:  Yes  Location of the provider (ex: office, home):  Office   Name of any referring provider:    Names of all persons participating in the telemedicine service and their role in the encounter:  Dinah Ngetich NP , Ruthell Rummage CMA, and Nijae Cudworth   Time spent on call:  Ruthell Rummage CMA spent 7  minutes on phone patient  Provider: Dinah Ngetich FNP-C  Ngetich, Nelda Bucks, NP  Patient Care Team: Ngetich, Nelda Bucks, NP as PCP - General (Family Medicine) Nahser, Wonda Cheng, MD as PCP - Cardiology (Cardiology)  Extended Emergency Contact Information Primary Emergency Contact: Claudina Lick, [place] [address] [place] [employer] Phone: [phone] Relation: Niece Secondary Emergency Contact: Silvestre Mesi Home Phone: [phone] Relation: Sister  Code Status: Full Code  Goals of care: Advanced Directive information Advanced Directives 06/25/2019  Does Patient Have a Medical Advance Directive? No  Type of Advance Directive -  Does patient want to make changes to medical advance directive? -  Copy of [place] in Chart? -  Would patient like information on creating a medical advance directive? No - Patient declined     Chief Complaint  Patient presents with  . Hospitalization Follow-up    Hospitalization 10/6 - 10/19 left foot pain patient states she is in a lot of pain and patient would like to discuss medication    HPI:  Pt is a 68 y.o. female seen today for an acute visit for post hospitalization from 06/25/2019 - 07/08/2019 for severe foot pain.she has a medical history of hypertension,CKD stage 3,Peripheral vascular Disease among other conditions.she presented to the ED with severe foot pain.Ultra sound showed  occlusion of left femoral popliteal bypass.She failed thrombolysis and ended up having left above-knee amputation on 07/01/2019.Her Hgb was 7.4 required multiple units of PRBC transfusion post- op.Hgb went up to 9.8 on discharge.she also required IVF due to dehydration.Hyponatremia,Hypokalemia/hypomagnesemia ,hypocalcemia with hypoalbuminemia was corrected.she was started on calcium and vit D supplement.Her HCTZ and ACE inhibitor was held.Her blood pressure remained stable.Skilled Nursing rehab recommended but patient declined.she was discharged home with family and Home Health Physical therapy, Occupational therapy,Nurse and CNA.She has follow up with Vein and vascular speciality Dr.Early Todd 07/30/2019 for staples removal. She state doing well since hospital discharge.Staples are intact.she denies any redness,swelling or drainage from incision site.HHN has been following up.she has had PT/OT working with her twice a week.and CNA assist with her ADL's.she lives by her self but her cousin assist with her cooking.she is able to transfer self from bed to wheelchair and self propel without any difficulties.she has a Bed side commode.No fall episodes.she complains of pain on left stump incision site rating 9/10 on scale.she was on oxycodone 5 mg tablet every 8 Hrs as needed discharged with from the hospital but states has run out.she request refill for pain medication.Pain medication dependence discussed encouraged to wean off as pain gets better.she denies any fever,chills, or cough.Appetite is good.  She is awaiting for a ramp to be build for now unable to get out of the house without assistance due to steps.she states her cousin assisted her today was able to go out to vote.  She states coping well left AKA.  Past Medical History:  Diagnosis Date  . Clotting disorder ([place])   . Hyperlipidemia   . Hypertension   . Peripheral vascular disease ([place])    vein surgery, left leg  . Stroke [facility]) 2002    ongoing mild loss of feeling in left side, had to learn to write right-handed  . Substance abuse ([place])   . Tobacco abuse    Past Surgical History:  Procedure Laterality Date  . AMPUTATION Left 07/01/2019   Procedure: AMPUTATION ABOVE KNEE, left;  Surgeon: Rosetta Posner, MD;  Location: [place];  Service: Vascular;  Laterality: Left;  . AORTA - BILATERAL FEMORAL ARTERY BYPASS GRAFT N/A 07/14/2016   Procedure: AORTOBIFEMORAL BYPASS GRAFT;  Surgeon: Waynetta Sandy, MD;  Location: [place];  Service: Vascular;  Laterality: N/A;  . AORTA - BILATERAL FEMORAL ARTERY BYPASS GRAFT Left 10/03/2018   Procedure: THROMBECTOMY OF LEFT LEG, LEFT FEMORAL-POPLITEAL BYPASS GRAFT, RIGHT TO LEFT FEMORAL-FEMORAL BYPASS GRAFT;  Surgeon: Rosetta Posner, MD;  Location: [place];  Service: Vascular;  Laterality: Left;  . CARDIAC CATHETERIZATION N/A 07/11/2016   Procedure: Left Heart Cath and Coronary Angiography;  Surgeon: Nelva Bush, MD;  Location: [place] CV [facility];  Service: Cardiovascular;  Laterality: N/A;  . FEMORAL-POPLITEAL BYPASS GRAFT Left 07/14/2016   Procedure: REDO BYPASS GRAFT FEMORAL-POPLITEAL ARTERY;  Surgeon: Waynetta Sandy, MD;  Location: [place];  Service: Vascular;  Laterality: Left;  . LOWER EXTREMITY ANGIOGRAPHY N/A 06/26/2019   Procedure: LOWER EXTREMITY ANGIOGRAPHY;  Surgeon: Marty Heck, MD;  Location: [place] [facility];  Service: Cardiovascular;  Laterality: N/A;  . LOWER EXTREMITY INTERVENTION Left 06/27/2019   Procedure: LOWER EXTREMITY INTERVENTION;  Surgeon: Marty Heck, MD;  Location: [place] CV [facility];  Service: Cardiovascular;  Laterality: Left;  . PERIPHERAL VASCULAR CATHETERIZATION N/A 07/08/2016   Procedure: Abdominal Aortogram w/ Bilateral Lower Extremity Runoff;  Surgeon: Elam Dutch, MD;  Location: [place] CV [facility];  Service: Cardiovascular;  Laterality: N/A;  . PERIPHERAL VASCULAR THROMBECTOMY Left 06/27/2019   Procedure: LYSIS RECHECK;   Surgeon: Marty Heck, MD;  Location: [place] [facility];  Service: Cardiovascular;  Laterality: Left;  Marland Kitchen VEIN SURGERY Left     Allergies  Allergen Reactions  . Penicillins Swelling    Has patient had a PCN reaction causing immediate rash, facial/tongue/throat swelling, SOB or lightheadedness with hypotension: YES Has patient had a PCN reaction causing severe rash involving mucus membranes or skin necrosis: NO Has patient had a PCN reaction that required hospitalization NO Has patient had a PCN reaction occurring within the last 10 years: NO If all of the above answers are "NO", then may proceed with Cephalosporin use.    Outpatient Encounter Medications as of 07/23/2019  Medication Sig  . aspirin 81 MG EC tablet Take 1 tablet (81 mg total) by mouth daily. Swallow whole.  Marland Kitchen atorvastatin (LIPITOR) 80 MG tablet Take 1 tablet (80 mg total) by mouth daily at 6 PM.  . calcium-vitamin D (OSCAL WITH D) 500-200 MG-UNIT tablet Take 1 tablet by mouth 2 (two) times daily.  . carvedilol (COREG) 3.125 MG tablet Take 1 tablet (3.125 mg total) by mouth 2 (two) times daily with a meal.  . mineral oil-hydrophilic petrolatum (AQUAPHOR) ointment Apply topically 2 (two) times daily. Apply to lower extremities  . oxyCODONE (OXY IR/ROXICODONE) 5 MG immediate release tablet Take 1 tablet (5 mg total) by mouth every 8 (eight) hours as needed for severe pain.  . [DISCONTINUED] oxyCODONE (OXY  IR/ROXICODONE) 5 MG immediate release tablet Take 1 tablet (5 mg total) by mouth every 8 (eight) hours as needed for up to 15 doses for severe pain.  Marland Kitchen UNABLE TO FIND HHN to draw labs: - CBC/diff,BMP   No facility-administered encounter medications on file as of 07/23/2019.     Review of Systems  Constitutional: Negative for appetite change, chills, fatigue and fever.  HENT: Negative for congestion, rhinorrhea, sinus pressure, sinus pain, sneezing and sore throat.   Respiratory: Negative for cough, chest tightness,  shortness of breath and wheezing.   Cardiovascular: Negative for chest pain, palpitations and leg swelling.  Gastrointestinal: Negative for abdominal distention, abdominal pain, constipation, diarrhea, nausea and vomiting.  Endocrine: Negative for polydipsia, polyphagia and polyuria.  Genitourinary: Negative for difficulty urinating, dysuria, flank pain, frequency and urgency.  Musculoskeletal: Positive for arthralgias and gait problem.       Self propel on wheelchair   Skin: Positive for wound. Negative for color change, pallor and rash.       Status post left AKA 07/01/2019   Neurological: Negative for dizziness, light-headedness, numbness and headaches.    Immunization History  Administered Date(s) Administered  . Influenza, High Dose Seasonal PF 11/02/2018  . Influenza,inj,Quad PF,6+ Mos 09/30/2016, 07/19/2017  . Pneumococcal Polysaccharide-23 04/04/2017   Pertinent  Health Maintenance Due  Topic Date Due  . PNA vac Low Risk Adult (2 of 2 - PCV13) 04/04/2018  . INFLUENZA VACCINE  04/20/2019  . COLONOSCOPY  09/19/2028 (Originally 12/20/2001)  . MAMMOGRAM  11/02/2019  . DEXA SCAN  Completed   Fall Risk  07/23/2019 06/04/2019 01/30/2019 11/06/2018 11/02/2018  Falls in the past year? 0 0 0 0 0  Number falls in past yr: 0 0 0 0 0  Injury with Fall? 0 0 0 0 0   There were no vitals filed for this visit. There is no height or weight on file to calculate BMI. Physical Exam  Unable to complete on telephone visit.  Labs reviewed: Recent Labs    06/28/19 0108  06/28/19 1600 06/29/19 0251  07/05/19 0853 07/06/19 0347 07/07/19 0258  NA 137  --   --   --    < > 137 138 140  K 3.4*  --   --   --    < > 3.4* 3.7 4.2  CL 109  --   --   --    < > 105 108 108  CO2 18*  --   --   --    < > 23 21* 21*  GLUCOSE 145*  --   --   --    < > 90 94 91  BUN 25*  --   --   --    < > 5* 6* 6*  CREATININE 1.32*   < >  --  0.99   < > 0.87 0.87 0.95  CALCIUM 7.2*  --   --   --    < > 6.3* 6.6* 7.3*   MG 0.9*  --  2.5* 1.9  --   --   --   --    < > = values in this interval not displayed.   Recent Labs    07/01/19 0458 07/02/19 1631 07/03/19 0445  AST 22 16 16   ALT 12 9 9   ALKPHOS 75 56 61  BILITOT 0.8 0.8 1.0  PROT 5.3* 5.0* 4.9*  ALBUMIN 1.9* 1.8* 1.9*   Recent Labs    06/03/19 [id number] 06/25/19 1434  06/28/19  [id number]  07/05/19 0217 07/06/19 0347 07/07/19 0258  WBC 5.9 17.5*   < > 24.5*   < > 7.8 6.3 7.3  NEUTROABS 3,387 14.1*  --  20.9*  --   --   --   --   HGB 9.7* 9.6*   < > 5.8*   < > 7.6* 7.4* 9.8*  HCT 29.2* 29.2*   < > 16.9*   < > 22.5* 22.5* 27.5*  MCV 92.7 93.0   < > 91.8   < > 91.5 93.0 90.2  PLT 198 377   < > 218   < > 303 372 396   < > = values in this interval not displayed.   Lab Results  Component Value Date   TSH 1.45 03/02/2018   Lab Results  Component Value Date   HGBA1C 6.1 (H) 06/03/2019   Lab Results  Component Value Date   CHOL 100 06/03/2019   HDL 35 (L) 06/03/2019   LDLCALC 44 06/03/2019   TRIG 127 06/03/2019   CHOLHDL 2.9 06/03/2019    Significant Diagnostic Results in last 30 days:  Dg Tibia/fibula Left  Result Date: 06/25/2019 CLINICAL DATA:  Pt with pain to lower extremity. Pt skin is weeping and flaking off in some areas from just below knee down to mid left foot. States she has a history of skin grafts to lower leg EXAM: LEFT TIBIA AND FIBULA - 2 VIEW COMPARISON:  None. FINDINGS: No fracture or bone lesion. Skeletal structures are demineralized. Knee and ankle joints are normally aligned. There are soft tissue defects along the anterolateral aspect of the ankle and lower leg. No radiopaque foreign body. There scattered vascular calcifications. IMPRESSION: 1. No fracture, bone lesion or evidence of osteomyelitis. Knee and ankle joints normally aligned. 2. Superficial soft tissue defects along the anterolateral ankle and lower leg. Electronically Signed   By: Lajean Manes M.D.   On: 06/25/2019 15:51   Dg Ankle Complete Left  Result  Date: 06/25/2019 CLINICAL DATA:  Pt with pain to lower extremity. Pt skin is weeping and flaking off in some areas from just below knee down to mid left foot. States she has a history of skin grafts to lower leg EXAM: LEFT ANKLE COMPLETE - 3+ VIEW COMPARISON:  None. FINDINGS: No fracture or bone lesion. Skeletal structures are diffusely demineralized. No bone resorption to suggest osteomyelitis. Ankle joint normally spaced and aligned.  No arthropathic changes. They were are superficial soft tissue defects along the anterolateral aspect of the ankle. No soft tissue air. No radiopaque foreign body. IMPRESSION: 1. No fracture, bone lesion or evidence of osteomyelitis. 2. Superficial to soft tissue defects along the anterolateral ankle. Electronically Signed   By: Lajean Manes M.D.   On: 06/25/2019 15:50   Dg Foot Complete Left  Result Date: 06/25/2019 CLINICAL DATA:  Pt with pain to lower extremity. Pt skin is weeping and flaking off in some areas from just below knee down to mid left foot. States she has a history of skin grafts to lower leg EXAM: LEFT FOOT - COMPLETE 3+ VIEW COMPARISON:  None. FINDINGS: No fracture or bone lesion. Skeletal structures are diffusely demineralized. Absent distal phalanges of the second and fifth toes with small tapering middle phalanges. Findings consistent with previous distal toe amputations. There are no areas of bone resorption to suggest osteomyelitis. Joints are normally spaced and aligned. Soft tissues are unremarkable. IMPRESSION: 1. No fracture or acute finding.  No evidence of osteomyelitis. Electronically Signed  By: Lajean Manes M.D.   On: 06/25/2019 15:48   Vas [place] Lower Extremity Bypass Graft Dupl  Result Date: 06/26/2019 LOWER EXTREMITY ARTERIAL DUPLEX STUDY Indications: Gangrene to left foot and tibial area, and peripheral artery              disease.  Current ABI: Unable to obtain secondary to wounds Limitations: Patient unable to position properly secondary  to pain Comparison Study: 10/04/2018 ABI- right =0.65, left= 0.65 Performing Technologist: Maudry Mayhew MHA, RDMS, RVT, RDCS  Examination Guidelines: A complete evaluation includes B-mode imaging, spectral Doppler, color Doppler, and power Doppler as needed of all accessible portions of each vessel. Bilateral testing is considered an integral part of a complete examination. Limited examinations for reoccurring indications may be performed as noted.  Right Graft #1: Right to left femoral-femoral bypass graft +------------------+--------+--------+----------+-------------------+                   PSV cm/sStenosisWaveform  Comments            +------------------+--------+--------+----------+-------------------+ Inflow                                      Unable to visualize +------------------+--------+--------+----------+-------------------+ Prox Anastomosis  45              monophasic                    +------------------+--------+--------+----------+-------------------+ Proximal Graft    72              monophasic                    +------------------+--------+--------+----------+-------------------+ Mid Graft         36              monophasic                    +------------------+--------+--------+----------+-------------------+ Distal Graft      52              monophasic                    +------------------+--------+--------+----------+-------------------+ Distal Anastomosis188             monophasic                    +------------------+--------+--------+----------+-------------------+ Outflow                                     Unable to visualize +------------------+--------+--------+----------+-------------------+  Left Graft #1: Left femoral-popliteal bypass graft +--------------------+--------+--------+----------+----------------------------+                     PSV cm/sStenosisWaveform  Comments                      +--------------------+--------+--------+----------+----------------------------+ Inflow                                        Unable to visualize          +--------------------+--------+--------+----------+----------------------------+ Proximal Anastomosis16              monophasic                             +--------------------+--------+--------+----------+----------------------------+  Proximal Graft      47              monophasicOccluded just distal to                                                    proximal segment.            +--------------------+--------+--------+----------+----------------------------+ Mid Graft                   occluded                                       +--------------------+--------+--------+----------+----------------------------+ Distal Graft                occluded                                       +--------------------+--------+--------+----------+----------------------------+ Distal Anastomosis          occluded                                       +--------------------+--------+--------+----------+----------------------------+ Outflow                                       Unable to visualize          +--------------------+--------+--------+----------+----------------------------+  Summary: Right: Patent right to left femoral-femoral bypass graft. Left: Color Doppler and pulsed wave Doppler suggest occluded left femoral-popliteal bypass graft at the proximal thigh. Multiple heterogenous areas visualized in the left groin, suggestive of prominent inguinal lymph nodes.  See table(s) above for measurements and observations. Electronically signed by Harold Barban MD on 06/26/2019 at 6:59:33 AM.    Final     Assessment/Plan 1. Essential hypertension Reports B/p stable.Her HCTZ and ACE inhibitor was held.continue on coreg 3.125 mg tablet twice daily.Encouraged to notify provider if SBP> 140 - CBC/diff,BMP,future to be  drawn by [facility]   2. Anemia of chronic disease Status post left AKA required multiple PRBC transfusion.Latest Hgb 9.8  - CBC/diff,future to be drawn by [facility]   3. Status post above-knee amputation of left lower extremity (HCC) Reports staples intact without any signs of infections.Has upcoming appointment with Vein and vascular specialist for staples removal 07/30/2019. Rates pain 9/10 requires oxycodone 5 mg tablet every 8 Hrs PRN refill # 30 with no refill.Narcotic uses discussed.will wean off as tolerated.  - continue with PT/OT  - continue with HHN for incision care  4. PAD (peripheral artery disease) (HCC) Status post left AKA due to gangrene.continue on ASA 81 mg EC  tablet and atorvastatin 80 mg tablet daily.  5. Hypocalcemia Ca level 6.3>7.3 continue on calcium-Vit D one by mouth twice daily.  - BMP,future to be drawn by [facility]  Family/ staff Communication: Reviewed plan of care with patient.  Labs/tests ordered: - CBC/diff,BMP,future to be drawn by [facility]   Spent 25 minutes of non-face to face with patient   Sandrea Hughs, NP

## 2019-07-24 DIAGNOSIS — E43 Unspecified severe protein-calorie malnutrition: Secondary | ICD-10-CM | POA: Diagnosis not present

## 2019-07-24 DIAGNOSIS — Z4781 Encounter for orthopedic aftercare following surgical amputation: Secondary | ICD-10-CM | POA: Diagnosis not present

## 2019-07-24 DIAGNOSIS — Z89612 Acquired absence of left leg above knee: Secondary | ICD-10-CM | POA: Diagnosis not present

## 2019-07-24 DIAGNOSIS — I69354 Hemiplegia and hemiparesis following cerebral infarction affecting left non-dominant side: Secondary | ICD-10-CM | POA: Diagnosis not present

## 2019-07-24 DIAGNOSIS — E785 Hyperlipidemia, unspecified: Secondary | ICD-10-CM | POA: Diagnosis not present

## 2019-07-24 DIAGNOSIS — D631 Anemia in chronic kidney disease: Secondary | ICD-10-CM | POA: Diagnosis not present

## 2019-07-24 DIAGNOSIS — I739 Peripheral vascular disease, unspecified: Secondary | ICD-10-CM | POA: Diagnosis not present

## 2019-07-24 DIAGNOSIS — I2510 Atherosclerotic heart disease of native coronary artery without angina pectoris: Secondary | ICD-10-CM | POA: Diagnosis not present

## 2019-07-24 DIAGNOSIS — I129 Hypertensive chronic kidney disease with stage 1 through stage 4 chronic kidney disease, or unspecified chronic kidney disease: Secondary | ICD-10-CM | POA: Diagnosis not present

## 2019-07-24 DIAGNOSIS — N183 Chronic kidney disease, stage 3 unspecified: Secondary | ICD-10-CM | POA: Diagnosis not present

## 2019-07-25 DIAGNOSIS — I2510 Atherosclerotic heart disease of native coronary artery without angina pectoris: Secondary | ICD-10-CM | POA: Diagnosis not present

## 2019-07-25 DIAGNOSIS — I69354 Hemiplegia and hemiparesis following cerebral infarction affecting left non-dominant side: Secondary | ICD-10-CM | POA: Diagnosis not present

## 2019-07-25 DIAGNOSIS — I129 Hypertensive chronic kidney disease with stage 1 through stage 4 chronic kidney disease, or unspecified chronic kidney disease: Secondary | ICD-10-CM | POA: Diagnosis not present

## 2019-07-25 DIAGNOSIS — E43 Unspecified severe protein-calorie malnutrition: Secondary | ICD-10-CM | POA: Diagnosis not present

## 2019-07-25 DIAGNOSIS — N183 Chronic kidney disease, stage 3 unspecified: Secondary | ICD-10-CM | POA: Diagnosis not present

## 2019-07-25 DIAGNOSIS — E785 Hyperlipidemia, unspecified: Secondary | ICD-10-CM | POA: Diagnosis not present

## 2019-07-25 DIAGNOSIS — Z89612 Acquired absence of left leg above knee: Secondary | ICD-10-CM | POA: Diagnosis not present

## 2019-07-25 DIAGNOSIS — I739 Peripheral vascular disease, unspecified: Secondary | ICD-10-CM | POA: Diagnosis not present

## 2019-07-25 DIAGNOSIS — Z4781 Encounter for orthopedic aftercare following surgical amputation: Secondary | ICD-10-CM | POA: Diagnosis not present

## 2019-07-25 DIAGNOSIS — D631 Anemia in chronic kidney disease: Secondary | ICD-10-CM | POA: Diagnosis not present

## 2019-07-25 NOTE — Addendum Note (Signed)
Addended byMarlowe Sax C on: 07/25/2019 12:25 PM   Modules accepted: Level of Service

## 2019-07-26 DIAGNOSIS — N183 Chronic kidney disease, stage 3 unspecified: Secondary | ICD-10-CM | POA: Diagnosis not present

## 2019-07-26 DIAGNOSIS — I69354 Hemiplegia and hemiparesis following cerebral infarction affecting left non-dominant side: Secondary | ICD-10-CM | POA: Diagnosis not present

## 2019-07-26 DIAGNOSIS — Z4781 Encounter for orthopedic aftercare following surgical amputation: Secondary | ICD-10-CM | POA: Diagnosis not present

## 2019-07-26 DIAGNOSIS — Z89612 Acquired absence of left leg above knee: Secondary | ICD-10-CM | POA: Diagnosis not present

## 2019-07-26 DIAGNOSIS — D631 Anemia in chronic kidney disease: Secondary | ICD-10-CM | POA: Diagnosis not present

## 2019-07-26 DIAGNOSIS — I129 Hypertensive chronic kidney disease with stage 1 through stage 4 chronic kidney disease, or unspecified chronic kidney disease: Secondary | ICD-10-CM | POA: Diagnosis not present

## 2019-07-26 DIAGNOSIS — I2510 Atherosclerotic heart disease of native coronary artery without angina pectoris: Secondary | ICD-10-CM | POA: Diagnosis not present

## 2019-07-26 DIAGNOSIS — E43 Unspecified severe protein-calorie malnutrition: Secondary | ICD-10-CM | POA: Diagnosis not present

## 2019-07-26 DIAGNOSIS — I739 Peripheral vascular disease, unspecified: Secondary | ICD-10-CM | POA: Diagnosis not present

## 2019-07-26 DIAGNOSIS — E785 Hyperlipidemia, unspecified: Secondary | ICD-10-CM | POA: Diagnosis not present

## 2019-07-29 DIAGNOSIS — I739 Peripheral vascular disease, unspecified: Secondary | ICD-10-CM | POA: Diagnosis not present

## 2019-07-29 DIAGNOSIS — I2510 Atherosclerotic heart disease of native coronary artery without angina pectoris: Secondary | ICD-10-CM | POA: Diagnosis not present

## 2019-07-29 DIAGNOSIS — Z4781 Encounter for orthopedic aftercare following surgical amputation: Secondary | ICD-10-CM | POA: Diagnosis not present

## 2019-07-29 DIAGNOSIS — Z89612 Acquired absence of left leg above knee: Secondary | ICD-10-CM | POA: Diagnosis not present

## 2019-07-29 DIAGNOSIS — D631 Anemia in chronic kidney disease: Secondary | ICD-10-CM | POA: Diagnosis not present

## 2019-07-29 DIAGNOSIS — N183 Chronic kidney disease, stage 3 unspecified: Secondary | ICD-10-CM | POA: Diagnosis not present

## 2019-07-29 DIAGNOSIS — I129 Hypertensive chronic kidney disease with stage 1 through stage 4 chronic kidney disease, or unspecified chronic kidney disease: Secondary | ICD-10-CM | POA: Diagnosis not present

## 2019-07-29 DIAGNOSIS — E43 Unspecified severe protein-calorie malnutrition: Secondary | ICD-10-CM | POA: Diagnosis not present

## 2019-07-29 DIAGNOSIS — E785 Hyperlipidemia, unspecified: Secondary | ICD-10-CM | POA: Diagnosis not present

## 2019-07-29 DIAGNOSIS — I69354 Hemiplegia and hemiparesis following cerebral infarction affecting left non-dominant side: Secondary | ICD-10-CM | POA: Diagnosis not present

## 2019-07-30 ENCOUNTER — Ambulatory Visit (INDEPENDENT_AMBULATORY_CARE_PROVIDER_SITE_OTHER): Payer: Self-pay | Admitting: Vascular Surgery

## 2019-07-30 ENCOUNTER — Other Ambulatory Visit: Payer: Self-pay

## 2019-07-30 ENCOUNTER — Encounter: Payer: Self-pay | Admitting: Vascular Surgery

## 2019-07-30 VITALS — BP 181/98 | HR 81 | Temp 97.5°F | Resp 20 | Ht 64.0 in | Wt 147.0 lb

## 2019-07-30 DIAGNOSIS — Z89619 Acquired absence of unspecified leg above knee: Secondary | ICD-10-CM

## 2019-07-30 NOTE — Progress Notes (Signed)
   Patient name: Wendy Townsend MRN: [id number] DOB: [birth year] Sex: female  REASON FOR VISIT: Follow-up left above-knee amputation on 07/01/2019  HPI: Wendy Townsend is a 68 y.o. female here today for follow-up.  She underwent left above-knee amputation on 07/01/2019.  Did well in the hospital was discharged to home.  She is here today for follow-up  Current Outpatient Medications  Medication Sig Dispense Refill  . aspirin 81 MG EC tablet Take 1 tablet (81 mg total) by mouth daily. Swallow whole. 30 tablet 11  . atorvastatin (LIPITOR) 80 MG tablet Take 1 tablet (80 mg total) by mouth daily at 6 PM. 90 tablet 3  . calcium-vitamin D (OSCAL WITH D) 500-200 MG-UNIT tablet Take 1 tablet by mouth 2 (two) times daily. 60 tablet 0  . carvedilol (COREG) 3.125 MG tablet Take 1 tablet (3.125 mg total) by mouth 2 (two) times daily with a meal. 180 tablet 1  . mineral oil-hydrophilic petrolatum (AQUAPHOR) ointment Apply topically 2 (two) times daily. Apply to lower extremities 420 g 0  . UNABLE TO FIND HHN to draw labs: - CBC/diff,BMP 1 Act 0  . oxyCODONE (OXY IR/ROXICODONE) 5 MG immediate release tablet Take 1 tablet (5 mg total) by mouth every 8 (eight) hours as needed for severe pain. (Patient not taking: Reported on 07/30/2019) 30 tablet 0   No current facility-administered medications for this visit.      PHYSICAL EXAM: Vitals:   07/30/19 0815  BP: (!) 181/98  Pulse: 81  Resp: 20  Temp: (!) 97.5 F (36.4 C)  SpO2: 97%  Weight: 147 lb (66.7 kg)  Height: 5\' 4"  (1.626 m)    GENERAL: The patient is a well-nourished female, in no acute distress. The vital signs are documented above. Left above-knee amputation is completely healed with no evidence of drainage or wound issues.  MEDICAL ISSUES: Staples removed today.  She will be referred to [employer] for stump shrinker and I discussed potential prosthetic fit.  I did explain to her that this is somewhat  challenging with above-knee amputation.  She is doing well with transfer from bed to wheelchair independently.  She will see [place] again on an as-needed basis   Rosetta Posner, MD [facility] Tel [phone] Pager [phone]

## 2019-07-31 DIAGNOSIS — I69354 Hemiplegia and hemiparesis following cerebral infarction affecting left non-dominant side: Secondary | ICD-10-CM | POA: Diagnosis not present

## 2019-07-31 DIAGNOSIS — D631 Anemia in chronic kidney disease: Secondary | ICD-10-CM | POA: Diagnosis not present

## 2019-07-31 DIAGNOSIS — E43 Unspecified severe protein-calorie malnutrition: Secondary | ICD-10-CM | POA: Diagnosis not present

## 2019-07-31 DIAGNOSIS — Z4781 Encounter for orthopedic aftercare following surgical amputation: Secondary | ICD-10-CM | POA: Diagnosis not present

## 2019-07-31 DIAGNOSIS — Z89612 Acquired absence of left leg above knee: Secondary | ICD-10-CM | POA: Diagnosis not present

## 2019-07-31 DIAGNOSIS — I129 Hypertensive chronic kidney disease with stage 1 through stage 4 chronic kidney disease, or unspecified chronic kidney disease: Secondary | ICD-10-CM | POA: Diagnosis not present

## 2019-07-31 DIAGNOSIS — I2510 Atherosclerotic heart disease of native coronary artery without angina pectoris: Secondary | ICD-10-CM | POA: Diagnosis not present

## 2019-07-31 DIAGNOSIS — I739 Peripheral vascular disease, unspecified: Secondary | ICD-10-CM | POA: Diagnosis not present

## 2019-07-31 DIAGNOSIS — E785 Hyperlipidemia, unspecified: Secondary | ICD-10-CM | POA: Diagnosis not present

## 2019-07-31 DIAGNOSIS — N183 Chronic kidney disease, stage 3 unspecified: Secondary | ICD-10-CM | POA: Diagnosis not present

## 2019-08-02 DIAGNOSIS — I129 Hypertensive chronic kidney disease with stage 1 through stage 4 chronic kidney disease, or unspecified chronic kidney disease: Secondary | ICD-10-CM | POA: Diagnosis not present

## 2019-08-02 DIAGNOSIS — I739 Peripheral vascular disease, unspecified: Secondary | ICD-10-CM | POA: Diagnosis not present

## 2019-08-02 DIAGNOSIS — E785 Hyperlipidemia, unspecified: Secondary | ICD-10-CM | POA: Diagnosis not present

## 2019-08-02 DIAGNOSIS — Z89612 Acquired absence of left leg above knee: Secondary | ICD-10-CM | POA: Diagnosis not present

## 2019-08-02 DIAGNOSIS — I69354 Hemiplegia and hemiparesis following cerebral infarction affecting left non-dominant side: Secondary | ICD-10-CM | POA: Diagnosis not present

## 2019-08-02 DIAGNOSIS — N183 Chronic kidney disease, stage 3 unspecified: Secondary | ICD-10-CM | POA: Diagnosis not present

## 2019-08-02 DIAGNOSIS — I2510 Atherosclerotic heart disease of native coronary artery without angina pectoris: Secondary | ICD-10-CM | POA: Diagnosis not present

## 2019-08-02 DIAGNOSIS — Z4781 Encounter for orthopedic aftercare following surgical amputation: Secondary | ICD-10-CM | POA: Diagnosis not present

## 2019-08-02 DIAGNOSIS — E43 Unspecified severe protein-calorie malnutrition: Secondary | ICD-10-CM | POA: Diagnosis not present

## 2019-08-02 DIAGNOSIS — D631 Anemia in chronic kidney disease: Secondary | ICD-10-CM | POA: Diagnosis not present

## 2019-08-07 DIAGNOSIS — M79672 Pain in left foot: Secondary | ICD-10-CM | POA: Diagnosis not present

## 2019-08-29 ENCOUNTER — Other Ambulatory Visit: Payer: Medicare HMO

## 2019-08-29 ENCOUNTER — Other Ambulatory Visit: Payer: Self-pay

## 2019-08-29 DIAGNOSIS — R7303 Prediabetes: Secondary | ICD-10-CM | POA: Diagnosis not present

## 2019-08-29 DIAGNOSIS — I10 Essential (primary) hypertension: Secondary | ICD-10-CM | POA: Diagnosis not present

## 2019-08-30 LAB — COMPLETE METABOLIC PANEL WITH GFR
AG Ratio: 1.2 (calc) (ref 1.0–2.5)
ALT: 13 U/L (ref 6–29)
AST: 18 U/L (ref 10–35)
Albumin: 4.2 g/dL (ref 3.6–5.1)
Alkaline phosphatase (APISO): 149 U/L (ref 37–153)
BUN/Creatinine Ratio: 12 (calc) (ref 6–22)
BUN: 13 mg/dL (ref 7–25)
CO2: 27 mmol/L (ref 20–32)
Calcium: 9.7 mg/dL (ref 8.6–10.4)
Chloride: 105 mmol/L (ref 98–110)
Creat: 1.05 mg/dL — ABNORMAL HIGH (ref 0.50–0.99)
GFR, Est African American: 64 mL/min/{1.73_m2} (ref >=60)
GFR, Est Non African American: 55 mL/min/{1.73_m2} — ABNORMAL LOW (ref >=60)
Globulin: 3.5 g/dL (calc) (ref 1.9–3.7)
Glucose, Bld: 83 mg/dL (ref 65–99)
Potassium: 3.5 mmol/L (ref 3.5–5.3)
Sodium: 144 mmol/L (ref 135–146)
Total Bilirubin: 0.7 mg/dL (ref 0.2–1.2)
Total Protein: 7.7 g/dL (ref 6.1–8.1)

## 2019-08-30 LAB — CBC WITH DIFFERENTIAL/PLATELET
Absolute Monocytes: 454 cells/uL (ref 200–950)
Basophils Absolute: 39 cells/uL (ref 0–200)
Basophils Relative: 0.7 %
Eosinophils Absolute: 101 cells/uL (ref 15–500)
Eosinophils Relative: 1.8 %
HCT: 36 % (ref 35.0–45.0)
Hemoglobin: 11.9 g/dL (ref 11.7–15.5)
Lymphs Abs: 1445 cells/uL (ref 850–3900)
MCH: 30.9 pg (ref 27.0–33.0)
MCHC: 33.1 g/dL (ref 32.0–36.0)
MCV: 93.5 fL (ref 80.0–100.0)
MPV: 10.7 fL (ref 7.5–12.5)
Monocytes Relative: 8.1 %
Neutro Abs: 3562 cells/uL (ref 1500–7800)
Neutrophils Relative %: 63.6 %
Platelets: 234 10*3/uL (ref 140–400)
RBC: 3.85 10*6/uL (ref 3.80–5.10)
RDW: 12.7 % (ref 11.0–15.0)
Total Lymphocyte: 25.8 %
WBC: 5.6 10*3/uL (ref 3.8–10.8)

## 2019-08-30 LAB — LIPID PANEL
Cholesterol: 125 mg/dL (ref <200)
HDL: 46 mg/dL — ABNORMAL LOW (ref >=50)
LDL Cholesterol (Calc): 62 mg/dL (calc)
Non-HDL Cholesterol (Calc): 79 mg/dL (calc) (ref <130)
Total CHOL/HDL Ratio: 2.7 (calc) (ref <5.0)
Triglycerides: 87 mg/dL (ref <150)

## 2019-08-30 LAB — HEMOGLOBIN A1C
Hgb A1c MFr Bld: 5.2 % of total Hgb (ref <5.7)
Mean Plasma Glucose: 103 (calc)
eAG (mmol/L): 5.7 (calc)

## 2019-08-30 LAB — TSH: TSH: 1.26 mIU/L (ref 0.40–4.50)

## 2019-09-03 ENCOUNTER — Encounter: Payer: Self-pay | Admitting: Family

## 2019-09-03 ENCOUNTER — Other Ambulatory Visit: Payer: Self-pay

## 2019-09-03 ENCOUNTER — Ambulatory Visit (INDEPENDENT_AMBULATORY_CARE_PROVIDER_SITE_OTHER): Payer: Medicare HMO | Admitting: Family

## 2019-09-03 VITALS — BP 118/80 | HR 62 | Temp 96.9°F | Resp 18 | Ht 64.0 in

## 2019-09-03 DIAGNOSIS — H6123 Impacted cerumen, bilateral: Secondary | ICD-10-CM

## 2019-09-03 DIAGNOSIS — I739 Peripheral vascular disease, unspecified: Secondary | ICD-10-CM

## 2019-09-03 DIAGNOSIS — Z23 Encounter for immunization: Secondary | ICD-10-CM

## 2019-09-03 DIAGNOSIS — N185 Chronic kidney disease, stage 5: Secondary | ICD-10-CM | POA: Diagnosis not present

## 2019-09-03 DIAGNOSIS — E785 Hyperlipidemia, unspecified: Secondary | ICD-10-CM | POA: Diagnosis not present

## 2019-09-03 DIAGNOSIS — D638 Anemia in other chronic diseases classified elsewhere: Secondary | ICD-10-CM | POA: Diagnosis not present

## 2019-09-03 DIAGNOSIS — I10 Essential (primary) hypertension: Secondary | ICD-10-CM

## 2019-09-03 DIAGNOSIS — L602 Onychogryphosis: Secondary | ICD-10-CM | POA: Diagnosis not present

## 2019-09-03 MED ORDER — CARVEDILOL 3.125 MG PO TABS: 3.1250 mg | ORAL_TABLET | Freq: Two times a day (BID) | ORAL | 1 refills | Status: DC

## 2019-09-03 MED ORDER — CALCIUM CARBONATE-VITAMIN D 500-200 MG-UNIT PO TABS: 1.0000 | ORAL_TABLET | Freq: Two times a day (BID) | ORAL | 1 refills | Status: DC

## 2019-09-03 NOTE — Progress Notes (Signed)
Provider: Marlowe Sax FNP-C   Tyress Loden, Nelda Bucks, NP  Patient Care Team: Dametri Ozburn, Nelda Bucks, NP as PCP - General (Family Medicine) Nahser, Wonda Cheng, MD as PCP - Cardiology (Cardiology)  Extended Emergency Contact Information Primary Emergency Contact: Claudina Lick, [place] [address] Johnnette Litter of [place] Phone: [phone] Relation: Niece Secondary Emergency Contact: Silvestre Mesi Home Phone: [phone] Relation: Sister  Code Status:  Full Code  Goals of care: Advanced Directive information Advanced Directives 07/30/2019  Does Patient Have a Medical Advance Directive? No  Type of Advance Directive -  Does patient want to make changes to medical advance directive? -  Copy of [place] in Chart? -  Would patient like information on creating a medical advance directive? No - Patient declined     Chief Complaint  Patient presents with   Medical Management of Chronic Issues    3 month follow and will discuss labs    Quality Metric Gaps    Patient would like to get flu vaccine today     HPI:  Pt is a 68 y.o. female seen today for 3 month follow medical management of chronic diseases.She denies any acute issues this visit. She states coping well post left KBA.She continues to work with [facility] physical Therapy.[place] filled out paper work for her to get a Ramp into her house to enable her to get in and out of the house.Also states HH completed [facility] bus paper work and was suppose to be mailed to provider to sign.No paperwork received at the office.New SCAT bus paperwork given to patient to complete at home and bring for provider to sign.  Hypertension - No home reading for evaluation.she denies any symptoms of low blood pressure.on coreg 3.12 mg tablet twice daily and ASA 81 mg tablet daily.latest CR 1.05 slightly high compared to previous level 0.95    Hyperlipidemia - Labs reviewed cholesterol 125,TRG 87 and LDL 62 on  atorvastatin 80 mg tablet daily.denies any muscle weakness or pain.    Anemia - Hgb has improved now 11.9 previous 9.8 ;7.4 with normal indices. States eating well and feeling much better since left leg surgery.coping well and has family support.     Past Medical History:  Diagnosis Date   Clotting disorder ([place])    Hyperlipidemia    Hypertension    Peripheral vascular disease ([place])    vein surgery, left leg   Stroke ([place]) 2002   ongoing mild loss of feeling in left side, had to learn to write right-handed   Substance abuse ([place])    Tobacco abuse    Past Surgical History:  Procedure Laterality Date   AMPUTATION Left 07/01/2019   Procedure: AMPUTATION ABOVE KNEE, left;  Surgeon: Rosetta Posner, MD;  Location: [place];  Service: Vascular;  Laterality: Left;   AORTA - BILATERAL FEMORAL ARTERY BYPASS GRAFT N/A 07/14/2016   Procedure: AORTOBIFEMORAL BYPASS GRAFT;  Surgeon: Waynetta Sandy, MD;  Location: [place];  Service: Vascular;  Laterality: N/A;   AORTA - BILATERAL FEMORAL ARTERY BYPASS GRAFT Left 10/03/2018   Procedure: THROMBECTOMY OF LEFT LEG, LEFT FEMORAL-POPLITEAL BYPASS GRAFT, RIGHT TO LEFT FEMORAL-FEMORAL BYPASS GRAFT;  Surgeon: Rosetta Posner, MD;  Location: [place];  Service: Vascular;  Laterality: Left;   CARDIAC CATHETERIZATION N/A 07/11/2016   Procedure: Left Heart Cath and Coronary Angiography;  Surgeon: Nelva Bush, MD;  Location: [place] [facility];  Service: Cardiovascular;  Laterality:  N/A;   FEMORAL-POPLITEAL BYPASS GRAFT Left 07/14/2016   Procedure: REDO BYPASS GRAFT FEMORAL-POPLITEAL ARTERY;  Surgeon: Waynetta Sandy, MD;  Location: [place];  Service: Vascular;  Laterality: Left;   LOWER EXTREMITY ANGIOGRAPHY N/A 06/26/2019   Procedure: LOWER EXTREMITY ANGIOGRAPHY;  Surgeon: Marty Heck, MD;  Location: [place] [facility];  Service: Cardiovascular;  Laterality: N/A;   LOWER EXTREMITY INTERVENTION Left 06/27/2019   Procedure: LOWER  EXTREMITY INTERVENTION;  Surgeon: Marty Heck, MD;  Location: [place] [facility];  Service: Cardiovascular;  Laterality: Left;   PERIPHERAL VASCULAR CATHETERIZATION N/A 07/08/2016   Procedure: Abdominal Aortogram w/ Bilateral Lower Extremity Runoff;  Surgeon: Elam Dutch, MD;  Location: [place] [facility];  Service: Cardiovascular;  Laterality: N/A;   PERIPHERAL VASCULAR THROMBECTOMY Left 06/27/2019   Procedure: LYSIS RECHECK;  Surgeon: Marty Heck, MD;  Location: [place] [facility];  Service: Cardiovascular;  Laterality: Left;   VEIN SURGERY Left     Allergies  Allergen Reactions   Penicillins Swelling    Has patient had a PCN reaction causing immediate rash, facial/tongue/throat swelling, SOB or lightheadedness with hypotension: YES Has patient had a PCN reaction causing severe rash involving mucus membranes or skin necrosis: NO Has patient had a PCN reaction that required hospitalization NO Has patient had a PCN reaction occurring within the last 10 years: NO If all of the above answers are "NO", then may proceed with Cephalosporin use.    Allergies as of 09/03/2019      Reactions   Penicillins Swelling   Has patient had a PCN reaction causing immediate rash, facial/tongue/throat swelling, SOB or lightheadedness with hypotension: YES Has patient had a PCN reaction causing severe rash involving mucus membranes or skin necrosis: NO Has patient had a PCN reaction that required hospitalization NO Has patient had a PCN reaction occurring within the last 10 years: NO If all of the above answers are "NO", then may proceed with Cephalosporin use.      Medication List       Accurate as of September 03, 2019 10:00 AM. If you have any questions, ask your nurse or doctor.        aspirin 81 MG EC tablet Take 1 tablet (81 mg total) by mouth daily. Swallow whole.   atorvastatin 80 MG tablet Commonly known as: LIPITOR Take 1 tablet (80 mg total) by mouth daily at 6  PM.   calcium-vitamin D 500-200 MG-UNIT tablet Commonly known as: OSCAL WITH D Take 1 tablet by mouth 2 (two) times daily. What changed: Another medication with the same name was removed. Continue taking this medication, and follow the directions you see here. Changed by: Sandrea Hughs, NP   carvedilol 3.125 MG tablet Commonly known as: COREG Take 1 tablet (3.125 mg total) by mouth 2 (two) times daily with a meal.   mineral oil-hydrophilic petrolatum ointment Apply topically 2 (two) times daily. Apply to lower extremities   UNABLE TO FIND HHN to draw labs: - CBC/diff,BMP       Review of Systems  Constitutional: Negative for appetite change, chills, fatigue and fever.       Sleeps about 8-8 and a half hours at night.   HENT: Negative for congestion, rhinorrhea, sinus pressure, sinus pain, sneezing, sore throat and trouble swallowing.   Eyes: Negative for discharge, redness, itching and visual disturbance.  Respiratory: Negative for cough, chest tightness, shortness of breath and wheezing.   Cardiovascular: Negative for chest pain, palpitations and  leg swelling.  Gastrointestinal: Negative for abdominal distention, abdominal pain, constipation, diarrhea, nausea and vomiting.  Endocrine: Negative for cold intolerance, heat intolerance, polydipsia, polyphagia and polyuria.  Genitourinary: Negative for difficulty urinating, dysuria, flank pain, frequency and urgency.  Musculoskeletal: Positive for gait problem. Negative for arthralgias, back pain and myalgias.       Left BKA   Skin: Negative for color change, pallor and rash.  Neurological: Negative for dizziness, speech difficulty, weakness, light-headedness, numbness and headaches.  Hematological: Does not bruise/bleed easily.  Psychiatric/Behavioral: Negative for agitation, confusion and sleep disturbance. The patient is not nervous/anxious.     Immunization History  Administered Date(s) Administered   Influenza, High Dose  Seasonal PF 11/02/2018   Influenza,inj,Quad PF,6+ Mos 09/30/2016, 07/19/2017   Pneumococcal Polysaccharide-23 04/04/2017   Pertinent  Health Maintenance Due  Topic Date Due   PNA vac Low Risk Adult (2 of 2 - PCV13) 04/04/2018   INFLUENZA VACCINE  04/20/2019   COLONOSCOPY  09/19/2028 (Originally 12/20/2001)   MAMMOGRAM  11/02/2019   DEXA SCAN  Completed   Fall Risk  09/03/2019 07/23/2019 06/04/2019 01/30/2019 11/06/2018  Falls in the past year? 0 0 0 0 0  Number falls in past yr: 0 0 0 0 0  Injury with Fall? 0 0 0 0 0    Vitals:   09/03/19 0949  BP: 118/80  Pulse: 62  Temp: (!) 96.9 F (36.1 C)  TempSrc: Temporal  SpO2: 96%  Height: _0  (1.626 m)   Body mass index is 25.23 kg/m. Physical Exam Constitutional:      Appearance: She is overweight.  HENT:     Head: Normocephalic.     Right Ear: There is impacted cerumen.     Left Ear: There is impacted cerumen.     Nose: Nose normal. No congestion or rhinorrhea.     Mouth/Throat:     Mouth: Mucous membranes are moist.     Pharynx: Oropharynx is clear. No oropharyngeal exudate or posterior oropharyngeal erythema.  Eyes:     General: No scleral icterus.       Right eye: No discharge.        Left eye: No discharge.     Extraocular Movements: Extraocular movements intact.     Conjunctiva/sclera: Conjunctivae normal.     Pupils: Pupils are equal, round, and reactive to light.  Neck:     Vascular: No carotid bruit.  Cardiovascular:     Rate and Rhythm: Normal rate and regular rhythm.     Pulses: Normal pulses.     Heart sounds: Normal heart sounds. No murmur. No friction rub. No gallop.   Pulmonary:     Effort: Pulmonary effort is normal. No respiratory distress.     Breath sounds: Normal breath sounds. No wheezing, rhonchi or rales.  Chest:     Chest wall: No tenderness.  Abdominal:     General: Bowel sounds are normal. There is no distension.     Palpations: Abdomen is soft. There is no mass.     Tenderness:  There is no abdominal tenderness. There is no right CVA tenderness, left CVA tenderness, guarding or rebound.  Musculoskeletal:        General: No swelling.     Cervical back: Normal range of motion. No rigidity or tenderness.     Left lower leg: No edema.     Comments: Self propel on wheelchair.L KBA   Lymphadenopathy:     Cervical: No cervical adenopathy.  Skin:  General: Skin is warm and dry.     Coloration: Skin is not pale.     Findings: No bruising, erythema or rash.  Neurological:     Mental Status: She is alert and oriented to person, place, and time.     Cranial Nerves: No cranial nerve deficit.     Sensory: No sensory deficit.     Motor: No weakness.     Coordination: Coordination normal.     Gait: Gait abnormal.  Psychiatric:        Mood and Affect: Mood normal.        Behavior: Behavior normal.        Thought Content: Thought content normal.        Judgment: Judgment normal.     Labs reviewed: Recent Labs    06/28/19 0108 06/28/19 0347 06/28/19 1600 06/29/19 0251 07/06/19 0347 07/07/19 0258 08/29/19 0854  NA 137   < >  --   --  138 140 144  K 3.4*   < >  --   --  3.7 4.2 3.5  CL 109   < >  --   --  108 108 105  CO2 18*   < >  --   --  21* 21* 27  GLUCOSE 145*   < >  --   --  94 91 83  BUN 25*   < >  --   --  6* 6* 13  CREATININE 1.32*  --   --  0.99 0.87 0.95 1.05*  CALCIUM 7.2*   < >  --   --  6.6* 7.3* 9.7  MG 0.9*  --  2.5* 1.9  --   --   --    < > = values in this interval not displayed.   Recent Labs    07/01/19 0458 07/02/19 1631 07/03/19 0445 08/29/19 0854  AST _0 ALT _1 ALKPHOS 75 56 61  --   BILITOT 0.8 0.8 1.0 0.7  PROT 5.3* 5.0* 4.9* 7.7  ALBUMIN 1.9* 1.8* 1.9*  --    Recent Labs    06/25/19 1434 06/28/19 0108 07/06/19 0347 07/07/19 0258 08/29/19 0854  WBC 17.5* 24.5* 6.3 7.3 5.6  NEUTROABS 14.1* 20.9*  --   --  3,562  HGB 9.6* 5.8* 7.4* 9.8* 11.9  HCT 29.2* 16.9* 22.5* 27.5* 36.0  MCV 93.0 91.8 93.0  90.2 93.5  PLT 377 218 372 396 234   Lab Results  Component Value Date   TSH 1.26 08/29/2019   Lab Results  Component Value Date   HGBA1C 5.2 08/29/2019   Lab Results  Component Value Date   CHOL 125 08/29/2019   HDL 46 (L) 08/29/2019   LDLCALC 62 08/29/2019   TRIG 87 08/29/2019   CHOLHDL 2.7 08/29/2019    Significant Diagnostic Results in last 30 days:  No results found.  Assessment/Plan 1. Need for influenza vaccination Afebrile.no signs of URI's. - Flu Vaccine QUAD High Dose(Fluad) given by [place]hell Rummage CMA   2. Essential hypertension B/p at goal.continue on Coreg 3.125 mg tablet twice daily with meals.  - CBC with Differential/Platelet; Future - CMP with eGFR([facility]); Future - TSH; Future  3. Hyperlipidemia LDL goal <70 LDL at goal.continue on atorvastatin 80 mg tablet daily.continue dietary modification.chair exercises encouraged due to her left BKA. - Lipid panel; Future  4. Chronic kidney disease, stage V (HCC) CR slight high.encouraged to avoid nephrotoxins and dose all other medication for  renal clearance. - CMP with eGFR([facility]); Future  5. PAD (peripheral artery disease) (HCC) Status post left BKA.B/p at goal.continue on ASA and Statin.  6. Anemia of chronic disease Hgb has improved. - CBC with Differential/Platelet; Future  7. Overgrown toenails Both feet overgrown toenails. - Ambulatory referral to Podiatry  8. Bilateral impacted cerumen Cerumen lavaged with warm water.large cerumen amount obtained from left ear.Patient states hearing better.bilateral TM clear.No instrument used.  Family/ staff Communication: Reviewed plan of care with patient  Labs/tests ordered:   - CBC with Differential/Platelet; Future - CMP with eGFR([facility]); Future - TSH; Future - Lipid panel; Future  Sandrea Hughs, NP

## 2019-09-06 DIAGNOSIS — M79672 Pain in left foot: Secondary | ICD-10-CM | POA: Diagnosis not present

## 2019-10-07 DIAGNOSIS — M79672 Pain in left foot: Secondary | ICD-10-CM | POA: Diagnosis not present

## 2019-10-22 ENCOUNTER — Encounter: Payer: Self-pay | Admitting: Family

## 2019-11-07 DIAGNOSIS — Z89612 Acquired absence of left leg above knee: Secondary | ICD-10-CM | POA: Diagnosis not present

## 2019-11-07 DIAGNOSIS — M79672 Pain in left foot: Secondary | ICD-10-CM | POA: Diagnosis not present

## 2019-11-12 ENCOUNTER — Other Ambulatory Visit: Payer: Self-pay

## 2019-11-12 ENCOUNTER — Telehealth: Payer: Self-pay

## 2019-11-12 DIAGNOSIS — Z89619 Acquired absence of unspecified leg above knee: Secondary | ICD-10-CM

## 2019-11-12 DIAGNOSIS — I739 Peripheral vascular disease, unspecified: Secondary | ICD-10-CM

## 2019-11-12 NOTE — Telephone Encounter (Signed)
Pt called wanting a new order sent to [facility] for Physical Therapy and Gait training for her left leg prosthetic.  I sent an order over and called to speak to one of the Referral Coordinators.  She advised me that depending on their availability, it may be a couple of days until they can see this patient.  Patient is aware and verbalized understanding.   Thurston Hole., LPN

## 2019-11-17 DIAGNOSIS — I2510 Atherosclerotic heart disease of native coronary artery without angina pectoris: Secondary | ICD-10-CM | POA: Diagnosis not present

## 2019-11-17 DIAGNOSIS — I69354 Hemiplegia and hemiparesis following cerebral infarction affecting left non-dominant side: Secondary | ICD-10-CM | POA: Diagnosis not present

## 2019-11-17 DIAGNOSIS — D631 Anemia in chronic kidney disease: Secondary | ICD-10-CM | POA: Diagnosis not present

## 2019-11-17 DIAGNOSIS — I120 Hypertensive chronic kidney disease with stage 5 chronic kidney disease or end stage renal disease: Secondary | ICD-10-CM | POA: Diagnosis not present

## 2019-11-17 DIAGNOSIS — E43 Unspecified severe protein-calorie malnutrition: Secondary | ICD-10-CM | POA: Diagnosis not present

## 2019-11-17 DIAGNOSIS — N185 Chronic kidney disease, stage 5: Secondary | ICD-10-CM | POA: Diagnosis not present

## 2019-11-17 DIAGNOSIS — Z4781 Encounter for orthopedic aftercare following surgical amputation: Secondary | ICD-10-CM | POA: Diagnosis not present

## 2019-11-17 DIAGNOSIS — E785 Hyperlipidemia, unspecified: Secondary | ICD-10-CM | POA: Diagnosis not present

## 2019-11-17 DIAGNOSIS — I739 Peripheral vascular disease, unspecified: Secondary | ICD-10-CM | POA: Diagnosis not present

## 2019-11-17 DIAGNOSIS — Z7982 Long term (current) use of aspirin: Secondary | ICD-10-CM | POA: Diagnosis not present

## 2019-11-19 ENCOUNTER — Ambulatory Visit (INDEPENDENT_AMBULATORY_CARE_PROVIDER_SITE_OTHER): Payer: Medicare HMO | Admitting: Physician Assistant

## 2019-11-19 ENCOUNTER — Other Ambulatory Visit: Payer: Self-pay

## 2019-11-19 DIAGNOSIS — I739 Peripheral vascular disease, unspecified: Secondary | ICD-10-CM

## 2019-11-19 DIAGNOSIS — Z89619 Acquired absence of unspecified leg above knee: Secondary | ICD-10-CM

## 2019-11-19 NOTE — Progress Notes (Signed)
Virtual Visit via Telephone Note   I connected with Wendy Townsend on 11/19/2019 by telephone and verified that I was speaking with the correct person using two identifiers. Patient was located at home. I am located at the [facility] office.   The limitations of evaluation and management by telemedicine and the availability of in person appointments have been previously discussed with the patient and are documented in the patients chart. The patient expressed understanding and consented to proceed.  PCP: Ngetich, Nelda Bucks, NP  History of Present Illness: Wendy Townsend is a 69 y.o. female who is s/p left above knee amputation 07/01/2019. She has done well post operatively. She has been working with PT with the goal to ambulate with a prosthesis. She is able to transfer from bed to wheel chair etc independently. She states that she wants to be able to walk with her prosthesis and her goal is " I want to run a marathon". She denies any trouble with her amputation. No right lower extremity pain, numbness, coldness or weakness. No open or non healing wounds. Hard to determine claudication symptoms as she does not ambulate presently  Past Medical History:  Diagnosis Date  . Clotting disorder ([place])   . Hyperlipidemia   . Hypertension   . Peripheral vascular disease ([place])    vein surgery, left leg  . Stroke [facility]) 2002   ongoing mild loss of feeling in left side, had to learn to write right-handed  . Substance abuse ([place])   . Tobacco abuse     Past Surgical History:  Procedure Laterality Date  . AMPUTATION Left 07/01/2019   Procedure: AMPUTATION ABOVE KNEE, left;  Surgeon: Rosetta Posner, MD;  Location: [place];  Service: Vascular;  Laterality: Left;  . AORTA - BILATERAL FEMORAL ARTERY BYPASS GRAFT N/A 07/14/2016   Procedure: AORTOBIFEMORAL BYPASS GRAFT;  Surgeon: Waynetta Sandy, MD;  Location: [place];  Service: Vascular;  Laterality: N/A;  . AORTA - BILATERAL FEMORAL ARTERY BYPASS GRAFT  Left 10/03/2018   Procedure: THROMBECTOMY OF LEFT LEG, LEFT FEMORAL-POPLITEAL BYPASS GRAFT, RIGHT TO LEFT FEMORAL-FEMORAL BYPASS GRAFT;  Surgeon: Rosetta Posner, MD;  Location: [place];  Service: Vascular;  Laterality: Left;  . CARDIAC CATHETERIZATION N/A 07/11/2016   Procedure: Left Heart Cath and Coronary Angiography;  Surgeon: Nelva Bush, MD;  Location: [place] [facility];  Service: Cardiovascular;  Laterality: N/A;  . FEMORAL-POPLITEAL BYPASS GRAFT Left 07/14/2016   Procedure: REDO BYPASS GRAFT FEMORAL-POPLITEAL ARTERY;  Surgeon: Waynetta Sandy, MD;  Location: [place];  Service: Vascular;  Laterality: Left;  . LOWER EXTREMITY ANGIOGRAPHY N/A 06/26/2019   Procedure: LOWER EXTREMITY ANGIOGRAPHY;  Surgeon: Marty Heck, MD;  Location: [place] [facility];  Service: Cardiovascular;  Laterality: N/A;  . LOWER EXTREMITY INTERVENTION Left 06/27/2019   Procedure: LOWER EXTREMITY INTERVENTION;  Surgeon: Marty Heck, MD;  Location: [place] [facility];  Service: Cardiovascular;  Laterality: Left;  . PERIPHERAL VASCULAR CATHETERIZATION N/A 07/08/2016   Procedure: Abdominal Aortogram w/ Bilateral Lower Extremity Runoff;  Surgeon: Elam Dutch, MD;  Location: [place] [facility];  Service: Cardiovascular;  Laterality: N/A;  . PERIPHERAL VASCULAR THROMBECTOMY Left 06/27/2019   Procedure: LYSIS RECHECK;  Surgeon: Marty Heck, MD;  Location: [place] [facility];  Service: Cardiovascular;  Laterality: Left;  Marland Kitchen VEIN SURGERY Left     Current Meds  Medication Sig  . aspirin 81 MG EC tablet Take 1 tablet (81 mg total) by mouth daily. Swallow  whole.  . atorvastatin (LIPITOR) 80 MG tablet Take 1 tablet (80 mg total) by mouth daily at 6 PM.  . calcium-vitamin D (OSCAL WITH D) 500-200 MG-UNIT tablet Take 1 tablet by mouth 2 (two) times daily.  . carvedilol (COREG) 3.125 MG tablet Take 1 tablet (3.125 mg total) by mouth 2 (two) times daily with a meal.  . mineral oil-hydrophilic  petrolatum (AQUAPHOR) ointment Apply topically 2 (two) times daily. Apply to lower extremities  . UNABLE TO FIND HHN to draw labs: - CBC/diff,BMP    12 system ROS was negative unless otherwise noted in HPI   Assessment and Plan: Wendy Townsend is a 69 year old female who is s/p left above knee amputation. She has done well post operatively.She has been receiving PT and HH with improvement. She still has the goal to ambulate with a prosthesis. She is able to do some of her activities of daily living presently but she would like to be more independent. In discussion with her I feel that she would greatly benefit from continued PT and gait training that will allow her to learn to use her prosthesis. She seems very motivated and I think it will improve her overall quality of life.  The patient was advised to call back or seek an in-person evaluation if she develops any symptoms or if the condition fails to improve as anticipated  Follow up as needed  I spent 10 minutes with the patient via telephone encounter.   Signed, Karoline Caldwell [facility] [place] Office: [phone]  11/19/2019, 2:56 PM

## 2019-11-21 DIAGNOSIS — I69354 Hemiplegia and hemiparesis following cerebral infarction affecting left non-dominant side: Secondary | ICD-10-CM | POA: Diagnosis not present

## 2019-11-21 DIAGNOSIS — I739 Peripheral vascular disease, unspecified: Secondary | ICD-10-CM | POA: Diagnosis not present

## 2019-11-21 DIAGNOSIS — Z4781 Encounter for orthopedic aftercare following surgical amputation: Secondary | ICD-10-CM | POA: Diagnosis not present

## 2019-11-21 DIAGNOSIS — E785 Hyperlipidemia, unspecified: Secondary | ICD-10-CM | POA: Diagnosis not present

## 2019-11-21 DIAGNOSIS — D631 Anemia in chronic kidney disease: Secondary | ICD-10-CM | POA: Diagnosis not present

## 2019-11-21 DIAGNOSIS — N185 Chronic kidney disease, stage 5: Secondary | ICD-10-CM | POA: Diagnosis not present

## 2019-11-21 DIAGNOSIS — I2510 Atherosclerotic heart disease of native coronary artery without angina pectoris: Secondary | ICD-10-CM | POA: Diagnosis not present

## 2019-11-21 DIAGNOSIS — E43 Unspecified severe protein-calorie malnutrition: Secondary | ICD-10-CM | POA: Diagnosis not present

## 2019-11-21 DIAGNOSIS — I120 Hypertensive chronic kidney disease with stage 5 chronic kidney disease or end stage renal disease: Secondary | ICD-10-CM | POA: Diagnosis not present

## 2019-11-21 DIAGNOSIS — Z7982 Long term (current) use of aspirin: Secondary | ICD-10-CM | POA: Diagnosis not present

## 2019-11-26 DIAGNOSIS — I2510 Atherosclerotic heart disease of native coronary artery without angina pectoris: Secondary | ICD-10-CM | POA: Diagnosis not present

## 2019-11-26 DIAGNOSIS — E43 Unspecified severe protein-calorie malnutrition: Secondary | ICD-10-CM | POA: Diagnosis not present

## 2019-11-26 DIAGNOSIS — D631 Anemia in chronic kidney disease: Secondary | ICD-10-CM | POA: Diagnosis not present

## 2019-11-26 DIAGNOSIS — E785 Hyperlipidemia, unspecified: Secondary | ICD-10-CM | POA: Diagnosis not present

## 2019-11-26 DIAGNOSIS — I69354 Hemiplegia and hemiparesis following cerebral infarction affecting left non-dominant side: Secondary | ICD-10-CM | POA: Diagnosis not present

## 2019-11-26 DIAGNOSIS — N185 Chronic kidney disease, stage 5: Secondary | ICD-10-CM | POA: Diagnosis not present

## 2019-11-26 DIAGNOSIS — I120 Hypertensive chronic kidney disease with stage 5 chronic kidney disease or end stage renal disease: Secondary | ICD-10-CM | POA: Diagnosis not present

## 2019-11-26 DIAGNOSIS — I739 Peripheral vascular disease, unspecified: Secondary | ICD-10-CM | POA: Diagnosis not present

## 2019-11-26 DIAGNOSIS — Z4781 Encounter for orthopedic aftercare following surgical amputation: Secondary | ICD-10-CM | POA: Diagnosis not present

## 2019-11-26 DIAGNOSIS — Z7982 Long term (current) use of aspirin: Secondary | ICD-10-CM | POA: Diagnosis not present

## 2019-11-28 DIAGNOSIS — E785 Hyperlipidemia, unspecified: Secondary | ICD-10-CM | POA: Diagnosis not present

## 2019-11-28 DIAGNOSIS — I2510 Atherosclerotic heart disease of native coronary artery without angina pectoris: Secondary | ICD-10-CM | POA: Diagnosis not present

## 2019-11-28 DIAGNOSIS — E43 Unspecified severe protein-calorie malnutrition: Secondary | ICD-10-CM | POA: Diagnosis not present

## 2019-11-28 DIAGNOSIS — D631 Anemia in chronic kidney disease: Secondary | ICD-10-CM | POA: Diagnosis not present

## 2019-11-28 DIAGNOSIS — N185 Chronic kidney disease, stage 5: Secondary | ICD-10-CM | POA: Diagnosis not present

## 2019-11-28 DIAGNOSIS — Z4781 Encounter for orthopedic aftercare following surgical amputation: Secondary | ICD-10-CM | POA: Diagnosis not present

## 2019-11-28 DIAGNOSIS — I69354 Hemiplegia and hemiparesis following cerebral infarction affecting left non-dominant side: Secondary | ICD-10-CM | POA: Diagnosis not present

## 2019-11-28 DIAGNOSIS — I739 Peripheral vascular disease, unspecified: Secondary | ICD-10-CM | POA: Diagnosis not present

## 2019-11-28 DIAGNOSIS — Z7982 Long term (current) use of aspirin: Secondary | ICD-10-CM | POA: Diagnosis not present

## 2019-11-28 DIAGNOSIS — I120 Hypertensive chronic kidney disease with stage 5 chronic kidney disease or end stage renal disease: Secondary | ICD-10-CM | POA: Diagnosis not present

## 2019-12-03 DIAGNOSIS — Z4781 Encounter for orthopedic aftercare following surgical amputation: Secondary | ICD-10-CM | POA: Diagnosis not present

## 2019-12-03 DIAGNOSIS — N185 Chronic kidney disease, stage 5: Secondary | ICD-10-CM | POA: Diagnosis not present

## 2019-12-03 DIAGNOSIS — E785 Hyperlipidemia, unspecified: Secondary | ICD-10-CM | POA: Diagnosis not present

## 2019-12-03 DIAGNOSIS — D631 Anemia in chronic kidney disease: Secondary | ICD-10-CM | POA: Diagnosis not present

## 2019-12-03 DIAGNOSIS — Z7982 Long term (current) use of aspirin: Secondary | ICD-10-CM | POA: Diagnosis not present

## 2019-12-03 DIAGNOSIS — I120 Hypertensive chronic kidney disease with stage 5 chronic kidney disease or end stage renal disease: Secondary | ICD-10-CM | POA: Diagnosis not present

## 2019-12-03 DIAGNOSIS — I739 Peripheral vascular disease, unspecified: Secondary | ICD-10-CM | POA: Diagnosis not present

## 2019-12-03 DIAGNOSIS — I2510 Atherosclerotic heart disease of native coronary artery without angina pectoris: Secondary | ICD-10-CM | POA: Diagnosis not present

## 2019-12-03 DIAGNOSIS — I69354 Hemiplegia and hemiparesis following cerebral infarction affecting left non-dominant side: Secondary | ICD-10-CM | POA: Diagnosis not present

## 2019-12-03 DIAGNOSIS — E43 Unspecified severe protein-calorie malnutrition: Secondary | ICD-10-CM | POA: Diagnosis not present

## 2019-12-05 DIAGNOSIS — I69354 Hemiplegia and hemiparesis following cerebral infarction affecting left non-dominant side: Secondary | ICD-10-CM | POA: Diagnosis not present

## 2019-12-05 DIAGNOSIS — N185 Chronic kidney disease, stage 5: Secondary | ICD-10-CM | POA: Diagnosis not present

## 2019-12-05 DIAGNOSIS — M79672 Pain in left foot: Secondary | ICD-10-CM | POA: Diagnosis not present

## 2019-12-05 DIAGNOSIS — D631 Anemia in chronic kidney disease: Secondary | ICD-10-CM | POA: Diagnosis not present

## 2019-12-05 DIAGNOSIS — E785 Hyperlipidemia, unspecified: Secondary | ICD-10-CM | POA: Diagnosis not present

## 2019-12-05 DIAGNOSIS — Z7982 Long term (current) use of aspirin: Secondary | ICD-10-CM | POA: Diagnosis not present

## 2019-12-05 DIAGNOSIS — I739 Peripheral vascular disease, unspecified: Secondary | ICD-10-CM | POA: Diagnosis not present

## 2019-12-05 DIAGNOSIS — Z4781 Encounter for orthopedic aftercare following surgical amputation: Secondary | ICD-10-CM | POA: Diagnosis not present

## 2019-12-05 DIAGNOSIS — I120 Hypertensive chronic kidney disease with stage 5 chronic kidney disease or end stage renal disease: Secondary | ICD-10-CM | POA: Diagnosis not present

## 2019-12-05 DIAGNOSIS — I2510 Atherosclerotic heart disease of native coronary artery without angina pectoris: Secondary | ICD-10-CM | POA: Diagnosis not present

## 2019-12-05 DIAGNOSIS — E43 Unspecified severe protein-calorie malnutrition: Secondary | ICD-10-CM | POA: Diagnosis not present

## 2019-12-10 DIAGNOSIS — D631 Anemia in chronic kidney disease: Secondary | ICD-10-CM | POA: Diagnosis not present

## 2019-12-10 DIAGNOSIS — E785 Hyperlipidemia, unspecified: Secondary | ICD-10-CM | POA: Diagnosis not present

## 2019-12-10 DIAGNOSIS — I120 Hypertensive chronic kidney disease with stage 5 chronic kidney disease or end stage renal disease: Secondary | ICD-10-CM | POA: Diagnosis not present

## 2019-12-10 DIAGNOSIS — Z7982 Long term (current) use of aspirin: Secondary | ICD-10-CM | POA: Diagnosis not present

## 2019-12-10 DIAGNOSIS — Z4781 Encounter for orthopedic aftercare following surgical amputation: Secondary | ICD-10-CM | POA: Diagnosis not present

## 2019-12-10 DIAGNOSIS — I69354 Hemiplegia and hemiparesis following cerebral infarction affecting left non-dominant side: Secondary | ICD-10-CM | POA: Diagnosis not present

## 2019-12-10 DIAGNOSIS — I2510 Atherosclerotic heart disease of native coronary artery without angina pectoris: Secondary | ICD-10-CM | POA: Diagnosis not present

## 2019-12-10 DIAGNOSIS — N185 Chronic kidney disease, stage 5: Secondary | ICD-10-CM | POA: Diagnosis not present

## 2019-12-10 DIAGNOSIS — I739 Peripheral vascular disease, unspecified: Secondary | ICD-10-CM | POA: Diagnosis not present

## 2019-12-10 DIAGNOSIS — E43 Unspecified severe protein-calorie malnutrition: Secondary | ICD-10-CM | POA: Diagnosis not present

## 2019-12-12 DIAGNOSIS — E785 Hyperlipidemia, unspecified: Secondary | ICD-10-CM | POA: Diagnosis not present

## 2019-12-12 DIAGNOSIS — N185 Chronic kidney disease, stage 5: Secondary | ICD-10-CM | POA: Diagnosis not present

## 2019-12-12 DIAGNOSIS — I2510 Atherosclerotic heart disease of native coronary artery without angina pectoris: Secondary | ICD-10-CM | POA: Diagnosis not present

## 2019-12-12 DIAGNOSIS — D631 Anemia in chronic kidney disease: Secondary | ICD-10-CM | POA: Diagnosis not present

## 2019-12-12 DIAGNOSIS — I69354 Hemiplegia and hemiparesis following cerebral infarction affecting left non-dominant side: Secondary | ICD-10-CM | POA: Diagnosis not present

## 2019-12-12 DIAGNOSIS — Z7982 Long term (current) use of aspirin: Secondary | ICD-10-CM | POA: Diagnosis not present

## 2019-12-12 DIAGNOSIS — I120 Hypertensive chronic kidney disease with stage 5 chronic kidney disease or end stage renal disease: Secondary | ICD-10-CM | POA: Diagnosis not present

## 2019-12-12 DIAGNOSIS — E43 Unspecified severe protein-calorie malnutrition: Secondary | ICD-10-CM | POA: Diagnosis not present

## 2019-12-12 DIAGNOSIS — Z4781 Encounter for orthopedic aftercare following surgical amputation: Secondary | ICD-10-CM | POA: Diagnosis not present

## 2019-12-12 DIAGNOSIS — I739 Peripheral vascular disease, unspecified: Secondary | ICD-10-CM | POA: Diagnosis not present

## 2019-12-19 ENCOUNTER — Encounter: Payer: Self-pay | Admitting: Family

## 2019-12-19 ENCOUNTER — Other Ambulatory Visit: Payer: Self-pay

## 2019-12-19 ENCOUNTER — Ambulatory Visit (INDEPENDENT_AMBULATORY_CARE_PROVIDER_SITE_OTHER): Payer: Medicare HMO | Admitting: Family

## 2019-12-19 DIAGNOSIS — Z7982 Long term (current) use of aspirin: Secondary | ICD-10-CM | POA: Diagnosis not present

## 2019-12-19 DIAGNOSIS — I120 Hypertensive chronic kidney disease with stage 5 chronic kidney disease or end stage renal disease: Secondary | ICD-10-CM | POA: Diagnosis not present

## 2019-12-19 DIAGNOSIS — I10 Essential (primary) hypertension: Secondary | ICD-10-CM

## 2019-12-19 DIAGNOSIS — I69354 Hemiplegia and hemiparesis following cerebral infarction affecting left non-dominant side: Secondary | ICD-10-CM | POA: Diagnosis not present

## 2019-12-19 DIAGNOSIS — Z4781 Encounter for orthopedic aftercare following surgical amputation: Secondary | ICD-10-CM | POA: Diagnosis not present

## 2019-12-19 DIAGNOSIS — N185 Chronic kidney disease, stage 5: Secondary | ICD-10-CM | POA: Diagnosis not present

## 2019-12-19 DIAGNOSIS — E43 Unspecified severe protein-calorie malnutrition: Secondary | ICD-10-CM | POA: Diagnosis not present

## 2019-12-19 DIAGNOSIS — I2510 Atherosclerotic heart disease of native coronary artery without angina pectoris: Secondary | ICD-10-CM | POA: Diagnosis not present

## 2019-12-19 DIAGNOSIS — I739 Peripheral vascular disease, unspecified: Secondary | ICD-10-CM | POA: Diagnosis not present

## 2019-12-19 DIAGNOSIS — D631 Anemia in chronic kidney disease: Secondary | ICD-10-CM | POA: Diagnosis not present

## 2019-12-19 DIAGNOSIS — E785 Hyperlipidemia, unspecified: Secondary | ICD-10-CM | POA: Diagnosis not present

## 2019-12-19 NOTE — Patient Instructions (Signed)
- check your blood pressure in the morning and evening.Record your blood pressure readings and bring to your next visit - Notify provider's office if B/p > 140/90 or go to ED if symptoms worsen.  - follow the instruction below when checking your blood pressure:  How to Take Your Blood Pressure You can take your blood pressure at home with a machine. You may need to check your blood pressure at home:  To check if you have high blood pressure (hypertension).  To check your blood pressure over time.  To make sure your blood pressure medicine is working. Supplies needed: You will need a blood pressure machine, or monitor. You can buy one at a drugstore or online. When choosing one:  Choose one with an arm cuff.  Choose one that wraps around your upper arm. Only one finger should fit between your arm and the cuff.  Do not choose one that measures your blood pressure from your wrist or finger. Your doctor can suggest a monitor. How to prepare Avoid these things for 30 minutes before checking your blood pressure:  Drinking caffeine.  Drinking alcohol.  Eating.  Smoking.  Exercising. Five minutes before checking your blood pressure:  Pee.  Sit in a dining chair. Avoid sitting in a soft couch or armchair.  Be quiet. Do not talk. How to take your blood pressure Follow the instructions that came with your machine. If you have a digital blood pressure monitor, these may be the instructions: 1. Sit up straight. 2. Place your feet on the floor. Do not cross your ankles or legs. 3. Rest your left arm at the level of your heart. You may rest it on a table, desk, or chair. 4. Pull up your shirt sleeve. 5. Wrap the blood pressure cuff around the upper part of your left arm. The cuff should be 1 inch (2.5 cm) above your elbow. It is best to wrap the cuff around bare skin. 6. Fit the cuff snugly around your arm. You should be able to place only one finger between the cuff and your  arm. 7. Put the cord inside the groove of your elbow. 8. Press the power button. 9. Sit quietly while the cuff fills with air and loses air. 10. Write down the numbers on the screen. 11. Wait 2-3 minutes and then repeat steps 1-10. What do the numbers mean? Two numbers make up your blood pressure. The first number is called systolic pressure. The second is called diastolic pressure. An example of a blood pressure reading is "120 over 80" (or 120/80). If you are an adult and do not have a medical condition, use this guide to find out if your blood pressure is normal: Normal  First number: below 120.  Second number: below 80. Elevated  First number: 120-129.  Second number: below 80. Hypertension stage 1  First number: 130-139.  Second number: 80-89. Hypertension stage 2  First number: 140 or above.  Second number: 24 or above. Your blood pressure is above normal even if only the top or bottom number is above normal. Follow these instructions at home:  Check your blood pressure as often as your doctor tells you to.  Take your monitor to your next doctor's appointment. Your doctor will: ? Make sure you are using it correctly. ? Make sure it is working right.  Make sure you understand what your blood pressure numbers should be.  Tell your doctor if your medicines are causing side effects. Contact a doctor  if:  Your blood pressure keeps being high. Get help right away if:  Your first blood pressure number is higher than 180.  Your second blood pressure number is higher than 120. This information is not intended to replace advice given to you by your health care provider. Make sure you discuss any questions you have with your health care provider. Document Revised: 08/18/2017 Document Reviewed: 02/12/2016 Elsevier Patient Education  2020 [employer].

## 2019-12-19 NOTE — Progress Notes (Signed)
Patient ID: Wendy Townsend, female   DOB: August 22, 1951, 69 y.o.   MRN: [id number] This service is provided via telemedicine  No vital signs collected/recorded due to the encounter was a telemedicine visit.   Location of patient (ex: home, work):  HOME  Patient consents to a telephone visit:  YES  Location of the provider (ex: office, home):  OFFICE  Name of any referring provider:  [facility] Rushil Kimbrell, NP  Names of all persons participating in the telemedicine service and their role in the encounter:  PATIENT, Edwin Dada, [place], [facility], NP  Time spent on call:  3:20    Provider: Zorianna Taliaferro FNP-C  Liset Mcmonigle, Nelda Bucks, NP  Patient Care Team: Neiko Trivedi, Nelda Bucks, NP as PCP - General (Family Medicine) Nahser, Wonda Cheng, MD as PCP - Cardiology (Cardiology)  Extended Emergency Contact Information Primary Emergency Contact: Claudina Lick, [place] [address] Johnnette Litter of [employer] Phone: [phone] Relation: Niece Secondary Emergency Contact: Silvestre Mesi Home Phone: [phone] Relation: Sister  Code Status:  DNR Goals of care: Advanced Directive information Advanced Directives 07/30/2019  Does Patient Have a Medical Advance Directive? No  Type of Advance Directive -  Does patient want to make changes to medical advance directive? -  Copy of [place] in Chart? -  Would patient like information on creating a medical advance directive? No - Patient declined     Chief Complaint  Patient presents with  . Acute Visit    Elevated blood pressure    HPI:  Pt is a 69 y.o. female seen today for an acute visit for evaluation of high blood pressure.she states physical Therapist checked blood pressure after she had been moving around readings 173/98.she is on clonidine 0.2 mg tablet one by mouth twice daily but has not been taking it.unclear why.also not taking her Atorvastatin.on carvedilol 3.125 mg tablet twice daily.she denies any  headache,dizziness,palpitation,chest pain or shortness of breath.also denies adding extra salt to her food.No swelling on right leg.     Past Medical History:  Diagnosis Date  . Clotting disorder ([place])   . Hyperlipidemia   . Hypertension   . Peripheral vascular disease ([place])    vein surgery, left leg  . Stroke [facility]) 2002   ongoing mild loss of feeling in left side, had to learn to write right-handed  . Substance abuse ([place])   . Tobacco abuse    Past Surgical History:  Procedure Laterality Date  . AMPUTATION Left 07/01/2019   Procedure: AMPUTATION ABOVE KNEE, left;  Surgeon: Rosetta Posner, MD;  Location: [place];  Service: Vascular;  Laterality: Left;  . AORTA - BILATERAL FEMORAL ARTERY BYPASS GRAFT N/A 07/14/2016   Procedure: AORTOBIFEMORAL BYPASS GRAFT;  Surgeon: Waynetta Sandy, MD;  Location: [place];  Service: Vascular;  Laterality: N/A;  . AORTA - BILATERAL FEMORAL ARTERY BYPASS GRAFT Left 10/03/2018   Procedure: THROMBECTOMY OF LEFT LEG, LEFT FEMORAL-POPLITEAL BYPASS GRAFT, RIGHT TO LEFT FEMORAL-FEMORAL BYPASS GRAFT;  Surgeon: Rosetta Posner, MD;  Location: [place];  Service: Vascular;  Laterality: Left;  . CARDIAC CATHETERIZATION N/A 07/11/2016   Procedure: Left Heart Cath and Coronary Angiography;  Surgeon: Nelva Bush, MD;  Location: [place] CV LAB;  Service: Cardiovascular;  Laterality: N/A;  . FEMORAL-POPLITEAL BYPASS GRAFT Left 07/14/2016   Procedure: REDO BYPASS GRAFT FEMORAL-POPLITEAL ARTERY;  Surgeon: Waynetta Sandy, MD;  Location: [place];  Service: Vascular;  Laterality: Left;  . LOWER EXTREMITY  ANGIOGRAPHY N/A 06/26/2019   Procedure: LOWER EXTREMITY ANGIOGRAPHY;  Surgeon: Marty Heck, MD;  Location: [place] CV LAB;  Service: Cardiovascular;  Laterality: N/A;  . LOWER EXTREMITY INTERVENTION Left 06/27/2019   Procedure: LOWER EXTREMITY INTERVENTION;  Surgeon: Marty Heck, MD;  Location: [place] CV LAB;  Service: Cardiovascular;   Laterality: Left;  . PERIPHERAL VASCULAR CATHETERIZATION N/A 07/08/2016   Procedure: Abdominal Aortogram w/ Bilateral Lower Extremity Runoff;  Surgeon: Elam Dutch, MD;  Location: [place] CV LAB;  Service: Cardiovascular;  Laterality: N/A;  . PERIPHERAL VASCULAR THROMBECTOMY Left 06/27/2019   Procedure: LYSIS RECHECK;  Surgeon: Marty Heck, MD;  Location: [place] CV LAB;  Service: Cardiovascular;  Laterality: Left;  Marland Kitchen VEIN SURGERY Left     Allergies  Allergen Reactions  . Penicillins Swelling    Has patient had a PCN reaction causing immediate rash, facial/tongue/throat swelling, SOB or lightheadedness with hypotension: YES Has patient had a PCN reaction causing severe rash involving mucus membranes or skin necrosis: NO Has patient had a PCN reaction that required hospitalization NO Has patient had a PCN reaction occurring within the last 10 years: NO If all of the above answers are "NO", then may proceed with Cephalosporin use.    Outpatient Encounter Medications as of 12/19/2019  Medication Sig  . aspirin 81 MG EC tablet Take 1 tablet (81 mg total) by mouth daily. Swallow whole.  Marland Kitchen atorvastatin (LIPITOR) 80 MG tablet Take 1 tablet (80 mg total) by mouth daily at 6 PM.  . calcium-vitamin D (OSCAL WITH D) 500-200 MG-UNIT tablet Take 1 tablet by mouth 2 (two) times daily.  . carvedilol (COREG) 3.125 MG tablet Take 1 tablet (3.125 mg total) by mouth 2 (two) times daily with a meal.  . cloNIDine (CATAPRES) 0.2 MG tablet Take 1 tablet by mouth in the morning and at bedtime.  . mineral oil-hydrophilic petrolatum (AQUAPHOR) ointment Apply topically 2 (two) times daily. Apply to lower extremities  . UNABLE TO FIND HHN to draw labs: - CBC/diff,BMP   No facility-administered encounter medications on file as of 12/19/2019.    Review of Systems  Constitutional: Negative for appetite change, chills, fatigue and fever.  HENT: Negative for congestion, rhinorrhea, sinus pressure,  sinus pain, sneezing and sore throat.   Eyes: Negative for discharge, redness and itching.  Respiratory: Negative for cough, chest tightness, shortness of breath and wheezing.   Cardiovascular: Negative for chest pain, palpitations and leg swelling.  Gastrointestinal: Negative for abdominal distention, abdominal pain, constipation, diarrhea, nausea and vomiting.  Genitourinary: Negative for difficulty urinating, dysuria, flank pain, frequency and urgency.  Musculoskeletal: Positive for gait problem. Negative for arthralgias.  Skin: Negative for color change, pallor and rash.  Neurological: Negative for dizziness, speech difficulty, weakness, light-headedness and headaches.  Psychiatric/Behavioral: Negative for agitation and sleep disturbance. The patient is not nervous/anxious.     Immunization History  Administered Date(s) Administered  . Fluad Quad(high Dose 65+) 09/03/2019  . Influenza, High Dose Seasonal PF 11/02/2018  . Influenza,inj,Quad PF,6+ Mos 09/30/2016, 07/19/2017  . Pneumococcal Polysaccharide-23 04/04/2017   Pertinent  Health Maintenance Due  Topic Date Due  . PNA vac Low Risk Adult (2 of 2 - PCV13) 04/04/2018  . MAMMOGRAM  11/02/2019  . COLONOSCOPY  09/19/2028 (Originally 12/20/2001)  . INFLUENZA VACCINE  04/19/2020  . DEXA SCAN  Completed   Fall Risk  12/19/2019 09/03/2019 07/23/2019 06/04/2019 01/30/2019  Falls in the past year? 0 0 0 0 0  Number falls  in past yr: 0 0 0 0 0  Injury with Fall? 0 0 0 0 0    There were no vitals filed for this visit. There is no height or weight on file to calculate BMI. Physical Exam  Unable to complete on telephone visit.   Labs reviewed: Recent Labs    06/28/19 0108 06/28/19 0347 06/28/19 1600 06/29/19 0251 06/30/19 0510 07/06/19 0347 07/07/19 0258 08/29/19 0854  NA 137  --   --   --    < > 138 140 144  K 3.4*  --   --   --    < > 3.7 4.2 3.5  CL 109  --   --   --    < > 108 108 105  CO2 18*  --   --   --    < > 21* 21*  27  GLUCOSE 145*  --   --   --    < > 94 91 83  BUN 25*  --   --   --    < > 6* 6* 13  CREATININE 1.32*   < >  --  0.99   < > 0.87 0.95 1.05*  CALCIUM 7.2*  --   --   --    < > 6.6* 7.3* 9.7  MG 0.9*  --  2.5* 1.9  --   --   --   --    < > = values in this interval not displayed.   Recent Labs    07/01/19 0458 07/01/19 0458 07/02/19 1631 07/03/19 0445 08/29/19 0854  AST 22   < > 16 16 18   ALT 12   < > 9 9 13   ALKPHOS 75  --  56 61  --   BILITOT 0.8   < > 0.8 1.0 0.7  PROT 5.3*   < > 5.0* 4.9* 7.7  ALBUMIN 1.9*  --  1.8* 1.9*  --    < > = values in this interval not displayed.   Recent Labs    06/25/19 1434 06/26/19 0514 06/28/19 0108 06/28/19 0347 07/06/19 0347 07/07/19 0258 08/29/19 0854  WBC 17.5*   < > 24.5*   < > 6.3 7.3 5.6  NEUTROABS 14.1*  --  20.9*  --   --   --  3,562  HGB 9.6*   < > 5.8*   < > 7.4* 9.8* 11.9  HCT 29.2*   < > 16.9*   < > 22.5* 27.5* 36.0  MCV 93.0   < > 91.8   < > 93.0 90.2 93.5  PLT 377   < > 218   < > 372 396 234   < > = values in this interval not displayed.   Lab Results  Component Value Date   TSH 1.26 08/29/2019   Lab Results  Component Value Date   HGBA1C 5.2 08/29/2019   Lab Results  Component Value Date   CHOL 125 08/29/2019   HDL 46 (L) 08/29/2019   LDLCALC 62 08/29/2019   TRIG 87 08/29/2019   CHOLHDL 2.7 08/29/2019    Significant Diagnostic Results in last 30 days:  No results found.  Assessment/Plan  Essential hypertension Elevated blood pressure today with HH Physical Therapy.reports was moving around prior to B/p being checked.  - Advised to check pressure while resting daily and record.Notify provider if B/p > 140/90 or go to ED if symptoms worsen.  - restart Clonidine 0.2 mg tablet one by mouth in the  morning and evening.continue on carvedilol 3.125 mg tablet twice daily with meals. - Continue ASA 81 mg EC tablet daily for cardiovascular event prevention.  - Advised to follow up in 1 week and bring B/p log to  visit.  Family/ staff Communication: Reviewed plan of care with patient verbalized understanding.  Labs/tests ordered: None   Next Appointment: 1 week for B/p follow up.   Spent 11 minutes of non-face to face with patient   I connected with  Wendy Townsend on 12/19/19 by a video enabled telemedicine application and verified that I am speaking with the correct person using two identifiers.   I discussed the limitations of evaluation and management by telemedicine. The patient expressed understanding and agreed to proceed.  Sandrea Hughs, NP

## 2019-12-24 DIAGNOSIS — D631 Anemia in chronic kidney disease: Secondary | ICD-10-CM | POA: Diagnosis not present

## 2019-12-24 DIAGNOSIS — I2510 Atherosclerotic heart disease of native coronary artery without angina pectoris: Secondary | ICD-10-CM | POA: Diagnosis not present

## 2019-12-24 DIAGNOSIS — I120 Hypertensive chronic kidney disease with stage 5 chronic kidney disease or end stage renal disease: Secondary | ICD-10-CM | POA: Diagnosis not present

## 2019-12-24 DIAGNOSIS — Z4781 Encounter for orthopedic aftercare following surgical amputation: Secondary | ICD-10-CM | POA: Diagnosis not present

## 2019-12-24 DIAGNOSIS — N185 Chronic kidney disease, stage 5: Secondary | ICD-10-CM | POA: Diagnosis not present

## 2019-12-24 DIAGNOSIS — E43 Unspecified severe protein-calorie malnutrition: Secondary | ICD-10-CM | POA: Diagnosis not present

## 2019-12-24 DIAGNOSIS — Z7982 Long term (current) use of aspirin: Secondary | ICD-10-CM | POA: Diagnosis not present

## 2019-12-24 DIAGNOSIS — I739 Peripheral vascular disease, unspecified: Secondary | ICD-10-CM | POA: Diagnosis not present

## 2019-12-24 DIAGNOSIS — E785 Hyperlipidemia, unspecified: Secondary | ICD-10-CM | POA: Diagnosis not present

## 2019-12-24 DIAGNOSIS — I69354 Hemiplegia and hemiparesis following cerebral infarction affecting left non-dominant side: Secondary | ICD-10-CM | POA: Diagnosis not present

## 2019-12-25 DIAGNOSIS — I69354 Hemiplegia and hemiparesis following cerebral infarction affecting left non-dominant side: Secondary | ICD-10-CM | POA: Diagnosis not present

## 2019-12-25 DIAGNOSIS — D631 Anemia in chronic kidney disease: Secondary | ICD-10-CM | POA: Diagnosis not present

## 2019-12-25 DIAGNOSIS — I2510 Atherosclerotic heart disease of native coronary artery without angina pectoris: Secondary | ICD-10-CM | POA: Diagnosis not present

## 2019-12-25 DIAGNOSIS — I120 Hypertensive chronic kidney disease with stage 5 chronic kidney disease or end stage renal disease: Secondary | ICD-10-CM | POA: Diagnosis not present

## 2019-12-25 DIAGNOSIS — Z7982 Long term (current) use of aspirin: Secondary | ICD-10-CM | POA: Diagnosis not present

## 2019-12-25 DIAGNOSIS — E43 Unspecified severe protein-calorie malnutrition: Secondary | ICD-10-CM | POA: Diagnosis not present

## 2019-12-25 DIAGNOSIS — I739 Peripheral vascular disease, unspecified: Secondary | ICD-10-CM | POA: Diagnosis not present

## 2019-12-25 DIAGNOSIS — Z4781 Encounter for orthopedic aftercare following surgical amputation: Secondary | ICD-10-CM | POA: Diagnosis not present

## 2019-12-25 DIAGNOSIS — N185 Chronic kidney disease, stage 5: Secondary | ICD-10-CM | POA: Diagnosis not present

## 2019-12-25 DIAGNOSIS — E785 Hyperlipidemia, unspecified: Secondary | ICD-10-CM | POA: Diagnosis not present

## 2019-12-26 ENCOUNTER — Ambulatory Visit (INDEPENDENT_AMBULATORY_CARE_PROVIDER_SITE_OTHER): Payer: Medicare HMO | Admitting: Family

## 2019-12-26 ENCOUNTER — Encounter: Payer: Self-pay | Admitting: Family

## 2019-12-26 ENCOUNTER — Other Ambulatory Visit: Payer: Self-pay

## 2019-12-26 DIAGNOSIS — I10 Essential (primary) hypertension: Secondary | ICD-10-CM

## 2019-12-26 MED ORDER — AMLODIPINE BESYLATE 5 MG PO TABS: 5.0000 mg | ORAL_TABLET | Freq: Every day | ORAL | 3 refills | Status: DC

## 2019-12-26 NOTE — Progress Notes (Signed)
Patient ID: Wendy Townsend, female   DOB: Jan 19, 1951, 69 y.o.   MRN: [id number] This service is provided via telemedicine  No vital signs collected/recorded due to the encounter was a telemedicine visit.   Location of patient (ex: home, work): HOME   Patient consents to a telephone visit: YES  Location of the provider (ex: office, home):  OFFICE  Name of any referring provider:  [facility] Obdulio Mash, NP  Names of all persons participating in the telemedicine service and their role in the encounter:  PATIENT, Edwin Dada, NP, Marlowe Sax, NP  Time spent on call:  3:16    Provider: Tanya Marvin FNP-C  Colbi Schiltz, Nelda Bucks, NP  Patient Care Team: Hilda Wexler, Nelda Bucks, NP as PCP - General (Family Medicine) Nahser, Wonda Cheng, MD as PCP - Cardiology (Cardiology)  Extended Emergency Contact Information Primary Emergency Contact: Claudina Lick, [place] [address] Johnnette Litter of [place] Phone: [phone] Relation: Niece Secondary Emergency Contact: Silvestre Mesi Home Phone: [phone] Relation: Sister  Code Status:  Full code  Goals of care: Advanced Directive information Advanced Directives 07/30/2019  Does Patient Have a Medical Advance Directive? No  Type of Advance Directive -  Does patient want to make changes to medical advance directive? -  Copy of [place] in Chart? -  Would patient like information on creating a medical advance directive? No - Patient declined     Chief Complaint  Patient presents with  . Medical Management of Chronic Issues    discuss blood pressures    HPI:  Pt is a 69 y.o. female seen today for an acute visit for follow up high blood pressure.she had a visit with me 12/19/2019 for high blood pressure was instructed to check blood pressure at home and record then follow up at the office for review.she states her ride cancelled today unable to come to the office.states blood pressure continues to be high though  much better today 120/80 has been above 140/90.she states not taking clonidine since it was discontinued in her previous hospital visit.she has restarted her atorvastatin 80 mg daily.has appointment for fasting labs.  She denies any headache,dizziness,chest pain,palpitation or shortness of breath.she also denies any symptoms of acute illnesses.  She has had a rump placed will apply for [employer] bus to assist with ride to her appointments.Getting around with her left Prosthesis post left AKA.   Past Medical History:  Diagnosis Date  . Clotting disorder ([place])   . Hyperlipidemia   . Hypertension   . Peripheral vascular disease ([place])    vein surgery, left leg  . Stroke [facility]) 2002   ongoing mild loss of feeling in left side, had to learn to write right-handed  . Substance abuse ([place])   . Tobacco abuse    Past Surgical History:  Procedure Laterality Date  . AMPUTATION Left 07/01/2019   Procedure: AMPUTATION ABOVE KNEE, left;  Surgeon: Rosetta Posner, MD;  Location: [place];  Service: Vascular;  Laterality: Left;  . AORTA - BILATERAL FEMORAL ARTERY BYPASS GRAFT N/A 07/14/2016   Procedure: AORTOBIFEMORAL BYPASS GRAFT;  Surgeon: Waynetta Sandy, MD;  Location: [place];  Service: Vascular;  Laterality: N/A;  . AORTA - BILATERAL FEMORAL ARTERY BYPASS GRAFT Left 10/03/2018   Procedure: THROMBECTOMY OF LEFT LEG, LEFT FEMORAL-POPLITEAL BYPASS GRAFT, RIGHT TO LEFT FEMORAL-FEMORAL BYPASS GRAFT;  Surgeon: Rosetta Posner, MD;  Location: [place];  Service: Vascular;  Laterality: Left;  . CARDIAC CATHETERIZATION  N/A 07/11/2016   Procedure: Left Heart Cath and Coronary Angiography;  Surgeon: Nelva Bush, MD;  Location: [place] CV LAB;  Service: Cardiovascular;  Laterality: N/A;  . FEMORAL-POPLITEAL BYPASS GRAFT Left 07/14/2016   Procedure: REDO BYPASS GRAFT FEMORAL-POPLITEAL ARTERY;  Surgeon: Waynetta Sandy, MD;  Location: [place];  Service: Vascular;  Laterality: Left;  . LOWER EXTREMITY  ANGIOGRAPHY N/A 06/26/2019   Procedure: LOWER EXTREMITY ANGIOGRAPHY;  Surgeon: Marty Heck, MD;  Location: [place] CV LAB;  Service: Cardiovascular;  Laterality: N/A;  . LOWER EXTREMITY INTERVENTION Left 06/27/2019   Procedure: LOWER EXTREMITY INTERVENTION;  Surgeon: Marty Heck, MD;  Location: [place] CV LAB;  Service: Cardiovascular;  Laterality: Left;  . PERIPHERAL VASCULAR CATHETERIZATION N/A 07/08/2016   Procedure: Abdominal Aortogram w/ Bilateral Lower Extremity Runoff;  Surgeon: Elam Dutch, MD;  Location: [place] CV LAB;  Service: Cardiovascular;  Laterality: N/A;  . PERIPHERAL VASCULAR THROMBECTOMY Left 06/27/2019   Procedure: LYSIS RECHECK;  Surgeon: Marty Heck, MD;  Location: [place] CV LAB;  Service: Cardiovascular;  Laterality: Left;  Marland Kitchen VEIN SURGERY Left     Allergies  Allergen Reactions  . Penicillins Swelling    Has patient had a PCN reaction causing immediate rash, facial/tongue/throat swelling, SOB or lightheadedness with hypotension: YES Has patient had a PCN reaction causing severe rash involving mucus membranes or skin necrosis: NO Has patient had a PCN reaction that required hospitalization NO Has patient had a PCN reaction occurring within the last 10 years: NO If all of the above answers are "NO", then may proceed with Cephalosporin use.    Outpatient Encounter Medications as of 12/26/2019  Medication Sig  . aspirin 81 MG EC tablet Take 1 tablet (81 mg total) by mouth daily. Swallow whole.  Marland Kitchen atorvastatin (LIPITOR) 80 MG tablet Take 1 tablet (80 mg total) by mouth daily at 6 PM.  . calcium-vitamin D (OSCAL WITH D) 500-200 MG-UNIT tablet Take 1 tablet by mouth 2 (two) times daily.  . carvedilol (COREG) 3.125 MG tablet Take 1 tablet (3.125 mg total) by mouth 2 (two) times daily with a meal.  . mineral oil-hydrophilic petrolatum (AQUAPHOR) ointment Apply topically 2 (two) times daily. Apply to lower extremities  . cloNIDine  (CATAPRES) 0.2 MG tablet Take 1 tablet by mouth in the morning and at bedtime.   No facility-administered encounter medications on file as of 12/26/2019.    Review of Systems  Constitutional: Negative for appetite change, chills, fatigue and fever.  HENT: Negative for congestion, rhinorrhea, sinus pressure, sinus pain, sneezing and sore throat.   Eyes: Negative for discharge, redness and itching.  Respiratory: Negative for cough, chest tightness, shortness of breath and wheezing.   Cardiovascular: Negative for chest pain, palpitations and leg swelling.  Gastrointestinal: Negative for abdominal distention, abdominal pain, constipation, diarrhea, nausea and vomiting.  Genitourinary: Negative for difficulty urinating, dysuria, frequency and urgency.  Musculoskeletal: Positive for gait problem. Negative for myalgias.       Left AKA   Skin: Negative for color change, pallor and rash.  Neurological: Negative for dizziness, speech difficulty, weakness, light-headedness, numbness and headaches.  Psychiatric/Behavioral: Negative for agitation, confusion and sleep disturbance. The patient is not nervous/anxious.     Immunization History  Administered Date(s) Administered  . Fluad Quad(high Dose 65+) 09/03/2019  . Influenza, High Dose Seasonal PF 11/02/2018  . Influenza,inj,Quad PF,6+ Mos 09/30/2016, 07/19/2017  . Pneumococcal Polysaccharide-23 04/04/2017   Pertinent  Health Maintenance Due  Topic Date Due  .  PNA vac Low Risk Adult (2 of 2 - PCV13) 04/04/2018  . MAMMOGRAM  11/02/2019  . COLONOSCOPY  09/19/2028 (Originally 12/20/2001)  . INFLUENZA VACCINE  04/19/2020  . DEXA SCAN  Completed   Fall Risk  12/26/2019 12/19/2019 09/03/2019 07/23/2019 06/04/2019  Falls in the past year? 0 0 0 0 0  Number falls in past yr: 0 0 0 0 0  Injury with Fall? 0 0 0 0 0    There were no vitals filed for this visit. There is no height or weight on file to calculate BMI. Physical Exam  Unable to complete on  telephone visit.   Labs reviewed: Recent Labs    06/28/19 0108 06/28/19 0347 06/28/19 1600 06/29/19 0251 06/30/19 0510 07/06/19 0347 07/07/19 0258 08/29/19 0854  NA 137  --   --   --    < > 138 140 144  K 3.4*  --   --   --    < > 3.7 4.2 3.5  CL 109  --   --   --    < > 108 108 105  CO2 18*  --   --   --    < > 21* 21* 27  GLUCOSE 145*  --   --   --    < > 94 91 83  BUN 25*  --   --   --    < > 6* 6* 13  CREATININE 1.32*   < >  --  0.99   < > 0.87 0.95 1.05*  CALCIUM 7.2*  --   --   --    < > 6.6* 7.3* 9.7  MG 0.9*  --  2.5* 1.9  --   --   --   --    < > = values in this interval not displayed.   Recent Labs    07/01/19 0458 07/01/19 0458 07/02/19 1631 07/03/19 0445 08/29/19 0854  AST 22   < > 16 16 18   ALT 12   < > 9 9 13   ALKPHOS 75  --  56 61  --   BILITOT 0.8   < > 0.8 1.0 0.7  PROT 5.3*   < > 5.0* 4.9* 7.7  ALBUMIN 1.9*  --  1.8* 1.9*  --    < > = values in this interval not displayed.   Recent Labs    06/25/19 1434 06/26/19 0514 06/28/19 0108 06/28/19 0347 07/06/19 0347 07/07/19 0258 08/29/19 0854  WBC 17.5*   < > 24.5*   < > 6.3 7.3 5.6  NEUTROABS 14.1*  --  20.9*  --   --   --  3,562  HGB 9.6*   < > 5.8*   < > 7.4* 9.8* 11.9  HCT 29.2*   < > 16.9*   < > 22.5* 27.5* 36.0  MCV 93.0   < > 91.8   < > 93.0 90.2 93.5  PLT 377   < > 218   < > 372 396 234   < > = values in this interval not displayed.   Lab Results  Component Value Date   TSH 1.26 08/29/2019   Lab Results  Component Value Date   HGBA1C 5.2 08/29/2019   Lab Results  Component Value Date   CHOL 125 08/29/2019   HDL 46 (L) 08/29/2019   LDLCALC 62 08/29/2019   TRIG 87 08/29/2019   CHOLHDL 2.7 08/29/2019    Significant Diagnostic Results in last 30 days:  No results found.  Assessment/Plan   Essential hypertension SBP continues to be high except today was 120.will start on Amlodipine 5 mg tablet one by mouth daily.continue on Carvedilol 3.125 mg tablet twice daily.on ASA for  prophylaxis. - Advised to avoid adding extra salt in diet and avoid canned food due to high sodium.  - DASH diet advised.additional information provided on AVS - Notify provider if B/p > 140/90  - amLODipine (NORVASC) 5 MG tablet; Take 1 tablet (5 mg total) by mouth daily.  Dispense: 90 tablet; Refill: 3   Family/ staff Communication: Reviewed plan of care with patient verbalized understanding.   Labs/tests ordered: None   Next Appointment: Has upcoming appointment 03/13/2020.   Spent 12 minutes of non-face to face with patient   I connected with  Temprance Biskup on 12/26/19 by Telephone enabled telemedicine application and verified that I am speaking with the correct person using two identifiers.   I discussed the limitations of evaluation and management by telemedicine. The patient expressed understanding and agreed to proceed.  Sandrea Hughs, NP

## 2019-12-26 NOTE — Patient Instructions (Addendum)
- Avoid adding extra salt in diet and avoid canned food due to high sodium.  - Notify provider if B/p > 140/90  - Start on amLODipine (NORVASC) 5 MG tablet; Take 1 tablet (5 mg total) by mouth daily.  Dispense: 90 tablet; Refill: 3  DASH Eating Plan DASH stands for "Dietary Approaches to Stop Hypertension." The DASH eating plan is a healthy eating plan that has been shown to reduce high blood pressure (hypertension). It may also reduce your risk for type 2 diabetes, heart disease, and stroke. The DASH eating plan may also help with weight loss. What are tips for following this plan?  General guidelines  Avoid eating more than 2,300 mg (milligrams) of salt (sodium) a day. If you have hypertension, you may need to reduce your sodium intake to 1,500 mg a day.  Limit alcohol intake to no more than 1 drink a day for nonpregnant women and 2 drinks a day for men. One drink equals 12 oz of beer, 5 oz of wine, or 1 oz of hard liquor.  Work with your health care provider to maintain a healthy body weight or to lose weight. Ask what an ideal weight is for you.  Get at least 30 minutes of exercise that causes your heart to beat faster (aerobic exercise) most days of the week. Activities may include walking, swimming, or biking.  Work with your health care provider or diet and nutrition specialist (dietitian) to adjust your eating plan to your individual calorie needs. Reading food labels   Check food labels for the amount of sodium per serving. Choose foods with less than 5 percent of the Daily Value of sodium. Generally, foods with less than 300 mg of sodium per serving fit into this eating plan.  To find whole grains, look for the word "whole" as the first word in the ingredient list. Shopping  Buy products labeled as "low-sodium" or "no salt added."  Buy fresh foods. Avoid canned foods and premade or frozen meals. Cooking  Avoid adding salt when cooking. Use salt-free seasonings or herbs  instead of table salt or sea salt. Check with your health care provider or pharmacist before using salt substitutes.  Do not fry foods. Cook foods using healthy methods such as baking, boiling, grilling, and broiling instead.  Cook with heart-healthy oils, such as olive, canola, soybean, or sunflower oil. Meal planning  Eat a balanced diet that includes: ? 5 or more servings of fruits and vegetables each day. At each meal, try to fill half of your plate with fruits and vegetables. ? Up to 6-8 servings of whole grains each day. ? Less than 6 oz of lean meat, poultry, or fish each day. A 3-oz serving of meat is about the same size as a deck of cards. One egg equals 1 oz. ? 2 servings of low-fat dairy each day. ? A serving of nuts, seeds, or beans 5 times each week. ? Heart-healthy fats. Healthy fats called Omega-3 fatty acids are found in foods such as flaxseeds and coldwater fish, like sardines, salmon, and mackerel.  Limit how much you eat of the following: ? Canned or prepackaged foods. ? Food that is high in trans fat, such as fried foods. ? Food that is high in saturated fat, such as fatty meat. ? Sweets, desserts, sugary drinks, and other foods with added sugar. ? Full-fat dairy products.  Do not salt foods before eating.  Try to eat at least 2 vegetarian meals each week.  Eat  more home-cooked food and less restaurant, buffet, and fast food.  When eating at a restaurant, ask that your food be prepared with less salt or no salt, if possible. What foods are recommended? The items listed may not be a complete list. Talk with your dietitian about what dietary choices are best for you. Grains Whole-grain or whole-wheat bread. Whole-grain or whole-wheat pasta. Brown rice. [place]. Bulgur. Whole-grain and low-sodium cereals. Pita bread. Low-fat, low-sodium crackers. Whole-wheat flour tortillas. Vegetables Fresh or frozen vegetables (raw, steamed, roasted, or grilled).  Low-sodium or reduced-sodium tomato and vegetable juice. Low-sodium or reduced-sodium tomato sauce and tomato paste. Low-sodium or reduced-sodium canned vegetables. Fruits All fresh, dried, or frozen fruit. Canned fruit in natural juice (without added sugar). Meat and other protein foods Skinless chicken or [place]. Ground chicken or [place]. Pork with fat trimmed off. Fish and seafood. Egg whites. Dried beans, peas, or lentils. Unsalted nuts, nut butters, and seeds. Unsalted canned beans. Lean cuts of beef with fat trimmed off. Low-sodium, lean deli meat. Dairy Low-fat (1%) or fat-free (skim) milk. Fat-free, low-fat, or reduced-fat cheeses. Nonfat, low-sodium ricotta or cottage cheese. Low-fat or nonfat yogurt. Low-fat, low-sodium cheese. Fats and oils Soft margarine without trans fats. Vegetable oil. Low-fat, reduced-fat, or light mayonnaise and salad dressings (reduced-sodium). Canola, safflower, olive, soybean, and sunflower oils. Avocado. Seasoning and other foods Herbs. Spices. Seasoning mixes without salt. Unsalted popcorn and pretzels. Fat-free sweets. What foods are not recommended? The items listed may not be a complete list. Talk with your dietitian about what dietary choices are best for you. Grains Baked goods made with fat, such as croissants, muffins, or some breads. Dry pasta or rice meal packs. Vegetables Creamed or fried vegetables. Vegetables in a cheese sauce. Regular canned vegetables (not low-sodium or reduced-sodium). Regular canned tomato sauce and paste (not low-sodium or reduced-sodium). Regular tomato and vegetable juice (not low-sodium or reduced-sodium). Angie Fava. Olives. Fruits Canned fruit in a light or heavy syrup. Fried fruit. Fruit in cream or butter sauce. Meat and other protein foods Fatty cuts of meat. Ribs. Fried meat. Berniece Salines. Sausage. Bologna and other processed lunch meats. Salami. Fatback. Hotdogs. Bratwurst. Salted nuts and seeds. Canned beans with added  salt. Canned or smoked fish. Whole eggs or egg yolks. Chicken or [place] with skin. Dairy Whole or 2% milk, cream, and half-and-half. Whole or full-fat cream cheese. Whole-fat or sweetened yogurt. Full-fat cheese. Nondairy creamers. Whipped toppings. Processed cheese and cheese spreads. Fats and oils Butter. Stick margarine. Lard. Shortening. Ghee. Bacon fat. Tropical oils, such as coconut, palm kernel, or palm oil. Seasoning and other foods Salted popcorn and pretzels. Onion salt, garlic salt, seasoned salt, table salt, and sea salt. Worcestershire sauce. Tartar sauce. Barbecue sauce. Teriyaki sauce. Soy sauce, including reduced-sodium. Steak sauce. Canned and packaged gravies. Fish sauce. Oyster sauce. Cocktail sauce. Horseradish that you find on the shelf. Ketchup. Mustard. Meat flavorings and tenderizers. Bouillon cubes. Hot sauce and Tabasco sauce. Premade or packaged marinades. Premade or packaged taco seasonings. Relishes. Regular salad dressings. Where to find more information:  National Heart, Lung, and [place]: https://wilson-eaton.com/  American Heart Association: www.heart.org Summary  The DASH eating plan is a healthy eating plan that has been shown to reduce high blood pressure (hypertension). It may also reduce your risk for type 2 diabetes, heart disease, and stroke.  With the DASH eating plan, you should limit salt (sodium) intake to 2,300 mg a day. If you have hypertension, you may need to reduce your sodium intake to  1,500 mg a day.  When on the DASH eating plan, aim to eat more fresh fruits and vegetables, whole grains, lean proteins, low-fat dairy, and heart-healthy fats.  Work with your health care provider or diet and nutrition specialist (dietitian) to adjust your eating plan to your individual calorie needs. This information is not intended to replace advice given to you by your health care provider. Make sure you discuss any questions you have with your health care  provider. Document Revised: 08/18/2017 Document Reviewed: 08/29/2016 Elsevier Patient Education  2020 [employer].

## 2019-12-27 DIAGNOSIS — E43 Unspecified severe protein-calorie malnutrition: Secondary | ICD-10-CM | POA: Diagnosis not present

## 2019-12-27 DIAGNOSIS — I2510 Atherosclerotic heart disease of native coronary artery without angina pectoris: Secondary | ICD-10-CM | POA: Diagnosis not present

## 2019-12-27 DIAGNOSIS — Z4781 Encounter for orthopedic aftercare following surgical amputation: Secondary | ICD-10-CM | POA: Diagnosis not present

## 2019-12-27 DIAGNOSIS — D631 Anemia in chronic kidney disease: Secondary | ICD-10-CM | POA: Diagnosis not present

## 2019-12-27 DIAGNOSIS — Z7982 Long term (current) use of aspirin: Secondary | ICD-10-CM | POA: Diagnosis not present

## 2019-12-27 DIAGNOSIS — I120 Hypertensive chronic kidney disease with stage 5 chronic kidney disease or end stage renal disease: Secondary | ICD-10-CM | POA: Diagnosis not present

## 2019-12-27 DIAGNOSIS — E785 Hyperlipidemia, unspecified: Secondary | ICD-10-CM | POA: Diagnosis not present

## 2019-12-27 DIAGNOSIS — N185 Chronic kidney disease, stage 5: Secondary | ICD-10-CM | POA: Diagnosis not present

## 2019-12-27 DIAGNOSIS — I69354 Hemiplegia and hemiparesis following cerebral infarction affecting left non-dominant side: Secondary | ICD-10-CM | POA: Diagnosis not present

## 2019-12-27 DIAGNOSIS — I739 Peripheral vascular disease, unspecified: Secondary | ICD-10-CM | POA: Diagnosis not present

## 2019-12-31 DIAGNOSIS — I739 Peripheral vascular disease, unspecified: Secondary | ICD-10-CM | POA: Diagnosis not present

## 2019-12-31 DIAGNOSIS — D631 Anemia in chronic kidney disease: Secondary | ICD-10-CM | POA: Diagnosis not present

## 2019-12-31 DIAGNOSIS — E43 Unspecified severe protein-calorie malnutrition: Secondary | ICD-10-CM | POA: Diagnosis not present

## 2019-12-31 DIAGNOSIS — E785 Hyperlipidemia, unspecified: Secondary | ICD-10-CM | POA: Diagnosis not present

## 2019-12-31 DIAGNOSIS — I2510 Atherosclerotic heart disease of native coronary artery without angina pectoris: Secondary | ICD-10-CM | POA: Diagnosis not present

## 2019-12-31 DIAGNOSIS — I69354 Hemiplegia and hemiparesis following cerebral infarction affecting left non-dominant side: Secondary | ICD-10-CM | POA: Diagnosis not present

## 2019-12-31 DIAGNOSIS — I120 Hypertensive chronic kidney disease with stage 5 chronic kidney disease or end stage renal disease: Secondary | ICD-10-CM | POA: Diagnosis not present

## 2019-12-31 DIAGNOSIS — N185 Chronic kidney disease, stage 5: Secondary | ICD-10-CM | POA: Diagnosis not present

## 2019-12-31 DIAGNOSIS — Z4781 Encounter for orthopedic aftercare following surgical amputation: Secondary | ICD-10-CM | POA: Diagnosis not present

## 2019-12-31 DIAGNOSIS — Z7982 Long term (current) use of aspirin: Secondary | ICD-10-CM | POA: Diagnosis not present

## 2020-01-01 DIAGNOSIS — E785 Hyperlipidemia, unspecified: Secondary | ICD-10-CM | POA: Diagnosis not present

## 2020-01-01 DIAGNOSIS — I120 Hypertensive chronic kidney disease with stage 5 chronic kidney disease or end stage renal disease: Secondary | ICD-10-CM | POA: Diagnosis not present

## 2020-01-01 DIAGNOSIS — Z7982 Long term (current) use of aspirin: Secondary | ICD-10-CM | POA: Diagnosis not present

## 2020-01-01 DIAGNOSIS — I69354 Hemiplegia and hemiparesis following cerebral infarction affecting left non-dominant side: Secondary | ICD-10-CM | POA: Diagnosis not present

## 2020-01-01 DIAGNOSIS — E43 Unspecified severe protein-calorie malnutrition: Secondary | ICD-10-CM | POA: Diagnosis not present

## 2020-01-01 DIAGNOSIS — D631 Anemia in chronic kidney disease: Secondary | ICD-10-CM | POA: Diagnosis not present

## 2020-01-01 DIAGNOSIS — Z4781 Encounter for orthopedic aftercare following surgical amputation: Secondary | ICD-10-CM | POA: Diagnosis not present

## 2020-01-01 DIAGNOSIS — N185 Chronic kidney disease, stage 5: Secondary | ICD-10-CM | POA: Diagnosis not present

## 2020-01-01 DIAGNOSIS — I739 Peripheral vascular disease, unspecified: Secondary | ICD-10-CM | POA: Diagnosis not present

## 2020-01-01 DIAGNOSIS — I2510 Atherosclerotic heart disease of native coronary artery without angina pectoris: Secondary | ICD-10-CM | POA: Diagnosis not present

## 2020-01-02 DIAGNOSIS — E785 Hyperlipidemia, unspecified: Secondary | ICD-10-CM | POA: Diagnosis not present

## 2020-01-02 DIAGNOSIS — Z7982 Long term (current) use of aspirin: Secondary | ICD-10-CM | POA: Diagnosis not present

## 2020-01-02 DIAGNOSIS — E43 Unspecified severe protein-calorie malnutrition: Secondary | ICD-10-CM | POA: Diagnosis not present

## 2020-01-02 DIAGNOSIS — N185 Chronic kidney disease, stage 5: Secondary | ICD-10-CM | POA: Diagnosis not present

## 2020-01-02 DIAGNOSIS — Z4781 Encounter for orthopedic aftercare following surgical amputation: Secondary | ICD-10-CM | POA: Diagnosis not present

## 2020-01-02 DIAGNOSIS — I120 Hypertensive chronic kidney disease with stage 5 chronic kidney disease or end stage renal disease: Secondary | ICD-10-CM | POA: Diagnosis not present

## 2020-01-02 DIAGNOSIS — I69354 Hemiplegia and hemiparesis following cerebral infarction affecting left non-dominant side: Secondary | ICD-10-CM | POA: Diagnosis not present

## 2020-01-02 DIAGNOSIS — I739 Peripheral vascular disease, unspecified: Secondary | ICD-10-CM | POA: Diagnosis not present

## 2020-01-02 DIAGNOSIS — I2510 Atherosclerotic heart disease of native coronary artery without angina pectoris: Secondary | ICD-10-CM | POA: Diagnosis not present

## 2020-01-02 DIAGNOSIS — D631 Anemia in chronic kidney disease: Secondary | ICD-10-CM | POA: Diagnosis not present

## 2020-01-05 DIAGNOSIS — M79672 Pain in left foot: Secondary | ICD-10-CM | POA: Diagnosis not present

## 2020-01-07 DIAGNOSIS — I739 Peripheral vascular disease, unspecified: Secondary | ICD-10-CM | POA: Diagnosis not present

## 2020-01-07 DIAGNOSIS — E785 Hyperlipidemia, unspecified: Secondary | ICD-10-CM | POA: Diagnosis not present

## 2020-01-07 DIAGNOSIS — Z7982 Long term (current) use of aspirin: Secondary | ICD-10-CM | POA: Diagnosis not present

## 2020-01-07 DIAGNOSIS — I2510 Atherosclerotic heart disease of native coronary artery without angina pectoris: Secondary | ICD-10-CM | POA: Diagnosis not present

## 2020-01-07 DIAGNOSIS — D631 Anemia in chronic kidney disease: Secondary | ICD-10-CM | POA: Diagnosis not present

## 2020-01-07 DIAGNOSIS — I120 Hypertensive chronic kidney disease with stage 5 chronic kidney disease or end stage renal disease: Secondary | ICD-10-CM | POA: Diagnosis not present

## 2020-01-07 DIAGNOSIS — I69354 Hemiplegia and hemiparesis following cerebral infarction affecting left non-dominant side: Secondary | ICD-10-CM | POA: Diagnosis not present

## 2020-01-07 DIAGNOSIS — E43 Unspecified severe protein-calorie malnutrition: Secondary | ICD-10-CM | POA: Diagnosis not present

## 2020-01-07 DIAGNOSIS — Z4781 Encounter for orthopedic aftercare following surgical amputation: Secondary | ICD-10-CM | POA: Diagnosis not present

## 2020-01-07 DIAGNOSIS — N185 Chronic kidney disease, stage 5: Secondary | ICD-10-CM | POA: Diagnosis not present

## 2020-01-08 DIAGNOSIS — I2510 Atherosclerotic heart disease of native coronary artery without angina pectoris: Secondary | ICD-10-CM | POA: Diagnosis not present

## 2020-01-08 DIAGNOSIS — E785 Hyperlipidemia, unspecified: Secondary | ICD-10-CM | POA: Diagnosis not present

## 2020-01-08 DIAGNOSIS — D631 Anemia in chronic kidney disease: Secondary | ICD-10-CM | POA: Diagnosis not present

## 2020-01-08 DIAGNOSIS — I739 Peripheral vascular disease, unspecified: Secondary | ICD-10-CM | POA: Diagnosis not present

## 2020-01-08 DIAGNOSIS — I69354 Hemiplegia and hemiparesis following cerebral infarction affecting left non-dominant side: Secondary | ICD-10-CM | POA: Diagnosis not present

## 2020-01-08 DIAGNOSIS — I120 Hypertensive chronic kidney disease with stage 5 chronic kidney disease or end stage renal disease: Secondary | ICD-10-CM | POA: Diagnosis not present

## 2020-01-08 DIAGNOSIS — E43 Unspecified severe protein-calorie malnutrition: Secondary | ICD-10-CM | POA: Diagnosis not present

## 2020-01-08 DIAGNOSIS — Z7982 Long term (current) use of aspirin: Secondary | ICD-10-CM | POA: Diagnosis not present

## 2020-01-08 DIAGNOSIS — N185 Chronic kidney disease, stage 5: Secondary | ICD-10-CM | POA: Diagnosis not present

## 2020-01-08 DIAGNOSIS — Z4781 Encounter for orthopedic aftercare following surgical amputation: Secondary | ICD-10-CM | POA: Diagnosis not present

## 2020-01-09 DIAGNOSIS — Z4781 Encounter for orthopedic aftercare following surgical amputation: Secondary | ICD-10-CM | POA: Diagnosis not present

## 2020-01-09 DIAGNOSIS — Z7982 Long term (current) use of aspirin: Secondary | ICD-10-CM | POA: Diagnosis not present

## 2020-01-09 DIAGNOSIS — I69354 Hemiplegia and hemiparesis following cerebral infarction affecting left non-dominant side: Secondary | ICD-10-CM | POA: Diagnosis not present

## 2020-01-09 DIAGNOSIS — I2510 Atherosclerotic heart disease of native coronary artery without angina pectoris: Secondary | ICD-10-CM | POA: Diagnosis not present

## 2020-01-09 DIAGNOSIS — I739 Peripheral vascular disease, unspecified: Secondary | ICD-10-CM | POA: Diagnosis not present

## 2020-01-09 DIAGNOSIS — E43 Unspecified severe protein-calorie malnutrition: Secondary | ICD-10-CM | POA: Diagnosis not present

## 2020-01-09 DIAGNOSIS — E785 Hyperlipidemia, unspecified: Secondary | ICD-10-CM | POA: Diagnosis not present

## 2020-01-09 DIAGNOSIS — D631 Anemia in chronic kidney disease: Secondary | ICD-10-CM | POA: Diagnosis not present

## 2020-01-09 DIAGNOSIS — N185 Chronic kidney disease, stage 5: Secondary | ICD-10-CM | POA: Diagnosis not present

## 2020-01-09 DIAGNOSIS — I120 Hypertensive chronic kidney disease with stage 5 chronic kidney disease or end stage renal disease: Secondary | ICD-10-CM | POA: Diagnosis not present

## 2020-01-10 DIAGNOSIS — N185 Chronic kidney disease, stage 5: Secondary | ICD-10-CM | POA: Diagnosis not present

## 2020-01-10 DIAGNOSIS — Z7982 Long term (current) use of aspirin: Secondary | ICD-10-CM | POA: Diagnosis not present

## 2020-01-10 DIAGNOSIS — D631 Anemia in chronic kidney disease: Secondary | ICD-10-CM | POA: Diagnosis not present

## 2020-01-10 DIAGNOSIS — I2510 Atherosclerotic heart disease of native coronary artery without angina pectoris: Secondary | ICD-10-CM | POA: Diagnosis not present

## 2020-01-10 DIAGNOSIS — Z4781 Encounter for orthopedic aftercare following surgical amputation: Secondary | ICD-10-CM | POA: Diagnosis not present

## 2020-01-10 DIAGNOSIS — E43 Unspecified severe protein-calorie malnutrition: Secondary | ICD-10-CM | POA: Diagnosis not present

## 2020-01-10 DIAGNOSIS — I69354 Hemiplegia and hemiparesis following cerebral infarction affecting left non-dominant side: Secondary | ICD-10-CM | POA: Diagnosis not present

## 2020-01-10 DIAGNOSIS — I120 Hypertensive chronic kidney disease with stage 5 chronic kidney disease or end stage renal disease: Secondary | ICD-10-CM | POA: Diagnosis not present

## 2020-01-10 DIAGNOSIS — I739 Peripheral vascular disease, unspecified: Secondary | ICD-10-CM | POA: Diagnosis not present

## 2020-01-10 DIAGNOSIS — E785 Hyperlipidemia, unspecified: Secondary | ICD-10-CM | POA: Diagnosis not present

## 2020-01-13 DIAGNOSIS — Z7982 Long term (current) use of aspirin: Secondary | ICD-10-CM | POA: Diagnosis not present

## 2020-01-13 DIAGNOSIS — I120 Hypertensive chronic kidney disease with stage 5 chronic kidney disease or end stage renal disease: Secondary | ICD-10-CM | POA: Diagnosis not present

## 2020-01-13 DIAGNOSIS — E43 Unspecified severe protein-calorie malnutrition: Secondary | ICD-10-CM | POA: Diagnosis not present

## 2020-01-13 DIAGNOSIS — I739 Peripheral vascular disease, unspecified: Secondary | ICD-10-CM | POA: Diagnosis not present

## 2020-01-13 DIAGNOSIS — I69354 Hemiplegia and hemiparesis following cerebral infarction affecting left non-dominant side: Secondary | ICD-10-CM | POA: Diagnosis not present

## 2020-01-13 DIAGNOSIS — E785 Hyperlipidemia, unspecified: Secondary | ICD-10-CM | POA: Diagnosis not present

## 2020-01-13 DIAGNOSIS — I2510 Atherosclerotic heart disease of native coronary artery without angina pectoris: Secondary | ICD-10-CM | POA: Diagnosis not present

## 2020-01-13 DIAGNOSIS — Z4781 Encounter for orthopedic aftercare following surgical amputation: Secondary | ICD-10-CM | POA: Diagnosis not present

## 2020-01-13 DIAGNOSIS — D631 Anemia in chronic kidney disease: Secondary | ICD-10-CM | POA: Diagnosis not present

## 2020-01-13 DIAGNOSIS — N185 Chronic kidney disease, stage 5: Secondary | ICD-10-CM | POA: Diagnosis not present

## 2020-01-14 DIAGNOSIS — I120 Hypertensive chronic kidney disease with stage 5 chronic kidney disease or end stage renal disease: Secondary | ICD-10-CM | POA: Diagnosis not present

## 2020-01-14 DIAGNOSIS — I739 Peripheral vascular disease, unspecified: Secondary | ICD-10-CM | POA: Diagnosis not present

## 2020-01-14 DIAGNOSIS — N185 Chronic kidney disease, stage 5: Secondary | ICD-10-CM | POA: Diagnosis not present

## 2020-01-14 DIAGNOSIS — I69354 Hemiplegia and hemiparesis following cerebral infarction affecting left non-dominant side: Secondary | ICD-10-CM | POA: Diagnosis not present

## 2020-01-14 DIAGNOSIS — Z4781 Encounter for orthopedic aftercare following surgical amputation: Secondary | ICD-10-CM | POA: Diagnosis not present

## 2020-01-14 DIAGNOSIS — D631 Anemia in chronic kidney disease: Secondary | ICD-10-CM | POA: Diagnosis not present

## 2020-01-14 DIAGNOSIS — Z7982 Long term (current) use of aspirin: Secondary | ICD-10-CM | POA: Diagnosis not present

## 2020-01-14 DIAGNOSIS — E43 Unspecified severe protein-calorie malnutrition: Secondary | ICD-10-CM | POA: Diagnosis not present

## 2020-01-14 DIAGNOSIS — I2510 Atherosclerotic heart disease of native coronary artery without angina pectoris: Secondary | ICD-10-CM | POA: Diagnosis not present

## 2020-01-14 DIAGNOSIS — E785 Hyperlipidemia, unspecified: Secondary | ICD-10-CM | POA: Diagnosis not present

## 2020-01-15 DIAGNOSIS — I120 Hypertensive chronic kidney disease with stage 5 chronic kidney disease or end stage renal disease: Secondary | ICD-10-CM | POA: Diagnosis not present

## 2020-01-15 DIAGNOSIS — D631 Anemia in chronic kidney disease: Secondary | ICD-10-CM | POA: Diagnosis not present

## 2020-01-15 DIAGNOSIS — E785 Hyperlipidemia, unspecified: Secondary | ICD-10-CM | POA: Diagnosis not present

## 2020-01-15 DIAGNOSIS — N185 Chronic kidney disease, stage 5: Secondary | ICD-10-CM | POA: Diagnosis not present

## 2020-01-15 DIAGNOSIS — Z4781 Encounter for orthopedic aftercare following surgical amputation: Secondary | ICD-10-CM | POA: Diagnosis not present

## 2020-01-15 DIAGNOSIS — E43 Unspecified severe protein-calorie malnutrition: Secondary | ICD-10-CM | POA: Diagnosis not present

## 2020-01-15 DIAGNOSIS — Z7982 Long term (current) use of aspirin: Secondary | ICD-10-CM | POA: Diagnosis not present

## 2020-01-15 DIAGNOSIS — I739 Peripheral vascular disease, unspecified: Secondary | ICD-10-CM | POA: Diagnosis not present

## 2020-01-15 DIAGNOSIS — I2510 Atherosclerotic heart disease of native coronary artery without angina pectoris: Secondary | ICD-10-CM | POA: Diagnosis not present

## 2020-01-15 DIAGNOSIS — I69354 Hemiplegia and hemiparesis following cerebral infarction affecting left non-dominant side: Secondary | ICD-10-CM | POA: Diagnosis not present

## 2020-01-16 DIAGNOSIS — D631 Anemia in chronic kidney disease: Secondary | ICD-10-CM | POA: Diagnosis not present

## 2020-01-16 DIAGNOSIS — I2510 Atherosclerotic heart disease of native coronary artery without angina pectoris: Secondary | ICD-10-CM | POA: Diagnosis not present

## 2020-01-16 DIAGNOSIS — E43 Unspecified severe protein-calorie malnutrition: Secondary | ICD-10-CM | POA: Diagnosis not present

## 2020-01-16 DIAGNOSIS — I739 Peripheral vascular disease, unspecified: Secondary | ICD-10-CM | POA: Diagnosis not present

## 2020-01-16 DIAGNOSIS — Z7982 Long term (current) use of aspirin: Secondary | ICD-10-CM | POA: Diagnosis not present

## 2020-01-16 DIAGNOSIS — E785 Hyperlipidemia, unspecified: Secondary | ICD-10-CM | POA: Diagnosis not present

## 2020-01-16 DIAGNOSIS — I120 Hypertensive chronic kidney disease with stage 5 chronic kidney disease or end stage renal disease: Secondary | ICD-10-CM | POA: Diagnosis not present

## 2020-01-16 DIAGNOSIS — N185 Chronic kidney disease, stage 5: Secondary | ICD-10-CM | POA: Diagnosis not present

## 2020-01-16 DIAGNOSIS — Z4781 Encounter for orthopedic aftercare following surgical amputation: Secondary | ICD-10-CM | POA: Diagnosis not present

## 2020-01-16 DIAGNOSIS — I69354 Hemiplegia and hemiparesis following cerebral infarction affecting left non-dominant side: Secondary | ICD-10-CM | POA: Diagnosis not present

## 2020-01-21 DIAGNOSIS — N185 Chronic kidney disease, stage 5: Secondary | ICD-10-CM | POA: Diagnosis not present

## 2020-01-21 DIAGNOSIS — I739 Peripheral vascular disease, unspecified: Secondary | ICD-10-CM | POA: Diagnosis not present

## 2020-01-21 DIAGNOSIS — I2510 Atherosclerotic heart disease of native coronary artery without angina pectoris: Secondary | ICD-10-CM | POA: Diagnosis not present

## 2020-01-21 DIAGNOSIS — I120 Hypertensive chronic kidney disease with stage 5 chronic kidney disease or end stage renal disease: Secondary | ICD-10-CM | POA: Diagnosis not present

## 2020-01-21 DIAGNOSIS — Z7982 Long term (current) use of aspirin: Secondary | ICD-10-CM | POA: Diagnosis not present

## 2020-01-21 DIAGNOSIS — Z89612 Acquired absence of left leg above knee: Secondary | ICD-10-CM | POA: Diagnosis not present

## 2020-01-21 DIAGNOSIS — Z87891 Personal history of nicotine dependence: Secondary | ICD-10-CM | POA: Diagnosis not present

## 2020-01-21 DIAGNOSIS — I69354 Hemiplegia and hemiparesis following cerebral infarction affecting left non-dominant side: Secondary | ICD-10-CM | POA: Diagnosis not present

## 2020-01-21 DIAGNOSIS — E43 Unspecified severe protein-calorie malnutrition: Secondary | ICD-10-CM | POA: Diagnosis not present

## 2020-01-21 DIAGNOSIS — E785 Hyperlipidemia, unspecified: Secondary | ICD-10-CM | POA: Diagnosis not present

## 2020-01-21 DIAGNOSIS — D631 Anemia in chronic kidney disease: Secondary | ICD-10-CM | POA: Diagnosis not present

## 2020-01-21 DIAGNOSIS — Z9181 History of falling: Secondary | ICD-10-CM | POA: Diagnosis not present

## 2020-01-21 DIAGNOSIS — Z4781 Encounter for orthopedic aftercare following surgical amputation: Secondary | ICD-10-CM | POA: Diagnosis not present

## 2020-01-23 DIAGNOSIS — N185 Chronic kidney disease, stage 5: Secondary | ICD-10-CM | POA: Diagnosis not present

## 2020-01-23 DIAGNOSIS — E43 Unspecified severe protein-calorie malnutrition: Secondary | ICD-10-CM | POA: Diagnosis not present

## 2020-01-23 DIAGNOSIS — Z7982 Long term (current) use of aspirin: Secondary | ICD-10-CM | POA: Diagnosis not present

## 2020-01-23 DIAGNOSIS — I739 Peripheral vascular disease, unspecified: Secondary | ICD-10-CM | POA: Diagnosis not present

## 2020-01-23 DIAGNOSIS — I2510 Atherosclerotic heart disease of native coronary artery without angina pectoris: Secondary | ICD-10-CM | POA: Diagnosis not present

## 2020-01-23 DIAGNOSIS — Z9181 History of falling: Secondary | ICD-10-CM | POA: Diagnosis not present

## 2020-01-23 DIAGNOSIS — I120 Hypertensive chronic kidney disease with stage 5 chronic kidney disease or end stage renal disease: Secondary | ICD-10-CM | POA: Diagnosis not present

## 2020-01-23 DIAGNOSIS — Z89612 Acquired absence of left leg above knee: Secondary | ICD-10-CM | POA: Diagnosis not present

## 2020-01-23 DIAGNOSIS — Z4781 Encounter for orthopedic aftercare following surgical amputation: Secondary | ICD-10-CM | POA: Diagnosis not present

## 2020-01-23 DIAGNOSIS — D631 Anemia in chronic kidney disease: Secondary | ICD-10-CM | POA: Diagnosis not present

## 2020-01-23 DIAGNOSIS — Z87891 Personal history of nicotine dependence: Secondary | ICD-10-CM | POA: Diagnosis not present

## 2020-01-23 DIAGNOSIS — I69354 Hemiplegia and hemiparesis following cerebral infarction affecting left non-dominant side: Secondary | ICD-10-CM | POA: Diagnosis not present

## 2020-01-23 DIAGNOSIS — E785 Hyperlipidemia, unspecified: Secondary | ICD-10-CM | POA: Diagnosis not present

## 2020-01-29 DIAGNOSIS — E785 Hyperlipidemia, unspecified: Secondary | ICD-10-CM | POA: Diagnosis not present

## 2020-01-29 DIAGNOSIS — N185 Chronic kidney disease, stage 5: Secondary | ICD-10-CM | POA: Diagnosis not present

## 2020-01-29 DIAGNOSIS — Z89612 Acquired absence of left leg above knee: Secondary | ICD-10-CM | POA: Diagnosis not present

## 2020-01-29 DIAGNOSIS — I2510 Atherosclerotic heart disease of native coronary artery without angina pectoris: Secondary | ICD-10-CM | POA: Diagnosis not present

## 2020-01-29 DIAGNOSIS — E43 Unspecified severe protein-calorie malnutrition: Secondary | ICD-10-CM | POA: Diagnosis not present

## 2020-01-29 DIAGNOSIS — Z9181 History of falling: Secondary | ICD-10-CM | POA: Diagnosis not present

## 2020-01-29 DIAGNOSIS — I69354 Hemiplegia and hemiparesis following cerebral infarction affecting left non-dominant side: Secondary | ICD-10-CM | POA: Diagnosis not present

## 2020-01-29 DIAGNOSIS — Z87891 Personal history of nicotine dependence: Secondary | ICD-10-CM | POA: Diagnosis not present

## 2020-01-29 DIAGNOSIS — D631 Anemia in chronic kidney disease: Secondary | ICD-10-CM | POA: Diagnosis not present

## 2020-01-29 DIAGNOSIS — Z7982 Long term (current) use of aspirin: Secondary | ICD-10-CM | POA: Diagnosis not present

## 2020-01-29 DIAGNOSIS — I120 Hypertensive chronic kidney disease with stage 5 chronic kidney disease or end stage renal disease: Secondary | ICD-10-CM | POA: Diagnosis not present

## 2020-01-29 DIAGNOSIS — I739 Peripheral vascular disease, unspecified: Secondary | ICD-10-CM | POA: Diagnosis not present

## 2020-01-29 DIAGNOSIS — Z4781 Encounter for orthopedic aftercare following surgical amputation: Secondary | ICD-10-CM | POA: Diagnosis not present

## 2020-02-05 DIAGNOSIS — E43 Unspecified severe protein-calorie malnutrition: Secondary | ICD-10-CM | POA: Diagnosis not present

## 2020-02-05 DIAGNOSIS — Z7982 Long term (current) use of aspirin: Secondary | ICD-10-CM | POA: Diagnosis not present

## 2020-02-05 DIAGNOSIS — I69354 Hemiplegia and hemiparesis following cerebral infarction affecting left non-dominant side: Secondary | ICD-10-CM | POA: Diagnosis not present

## 2020-02-05 DIAGNOSIS — Z4781 Encounter for orthopedic aftercare following surgical amputation: Secondary | ICD-10-CM | POA: Diagnosis not present

## 2020-02-05 DIAGNOSIS — D631 Anemia in chronic kidney disease: Secondary | ICD-10-CM | POA: Diagnosis not present

## 2020-02-05 DIAGNOSIS — I2510 Atherosclerotic heart disease of native coronary artery without angina pectoris: Secondary | ICD-10-CM | POA: Diagnosis not present

## 2020-02-05 DIAGNOSIS — N185 Chronic kidney disease, stage 5: Secondary | ICD-10-CM | POA: Diagnosis not present

## 2020-02-05 DIAGNOSIS — Z9181 History of falling: Secondary | ICD-10-CM | POA: Diagnosis not present

## 2020-02-05 DIAGNOSIS — Z87891 Personal history of nicotine dependence: Secondary | ICD-10-CM | POA: Diagnosis not present

## 2020-02-05 DIAGNOSIS — E785 Hyperlipidemia, unspecified: Secondary | ICD-10-CM | POA: Diagnosis not present

## 2020-02-05 DIAGNOSIS — Z89612 Acquired absence of left leg above knee: Secondary | ICD-10-CM | POA: Diagnosis not present

## 2020-02-05 DIAGNOSIS — I739 Peripheral vascular disease, unspecified: Secondary | ICD-10-CM | POA: Diagnosis not present

## 2020-02-05 DIAGNOSIS — I120 Hypertensive chronic kidney disease with stage 5 chronic kidney disease or end stage renal disease: Secondary | ICD-10-CM | POA: Diagnosis not present

## 2020-02-07 ENCOUNTER — Telehealth: Payer: Self-pay | Admitting: *Deleted

## 2020-02-07 DIAGNOSIS — Z4781 Encounter for orthopedic aftercare following surgical amputation: Secondary | ICD-10-CM | POA: Diagnosis not present

## 2020-02-07 DIAGNOSIS — Z9181 History of falling: Secondary | ICD-10-CM | POA: Diagnosis not present

## 2020-02-07 DIAGNOSIS — Z7982 Long term (current) use of aspirin: Secondary | ICD-10-CM | POA: Diagnosis not present

## 2020-02-07 DIAGNOSIS — I120 Hypertensive chronic kidney disease with stage 5 chronic kidney disease or end stage renal disease: Secondary | ICD-10-CM | POA: Diagnosis not present

## 2020-02-07 DIAGNOSIS — E43 Unspecified severe protein-calorie malnutrition: Secondary | ICD-10-CM | POA: Diagnosis not present

## 2020-02-07 DIAGNOSIS — Z89612 Acquired absence of left leg above knee: Secondary | ICD-10-CM | POA: Diagnosis not present

## 2020-02-07 DIAGNOSIS — Z87891 Personal history of nicotine dependence: Secondary | ICD-10-CM | POA: Diagnosis not present

## 2020-02-07 DIAGNOSIS — I739 Peripheral vascular disease, unspecified: Secondary | ICD-10-CM | POA: Diagnosis not present

## 2020-02-07 DIAGNOSIS — N185 Chronic kidney disease, stage 5: Secondary | ICD-10-CM | POA: Diagnosis not present

## 2020-02-07 DIAGNOSIS — I69354 Hemiplegia and hemiparesis following cerebral infarction affecting left non-dominant side: Secondary | ICD-10-CM | POA: Diagnosis not present

## 2020-02-07 DIAGNOSIS — E785 Hyperlipidemia, unspecified: Secondary | ICD-10-CM | POA: Diagnosis not present

## 2020-02-07 DIAGNOSIS — I2510 Atherosclerotic heart disease of native coronary artery without angina pectoris: Secondary | ICD-10-CM | POA: Diagnosis not present

## 2020-02-07 DIAGNOSIS — D631 Anemia in chronic kidney disease: Secondary | ICD-10-CM | POA: Diagnosis not present

## 2020-02-07 NOTE — Telephone Encounter (Signed)
Kendra with [facility] called requesting verbal orders for patient for OT to evaluate and Treat.  Verbal orders given.

## 2020-03-09 ENCOUNTER — Other Ambulatory Visit: Payer: Medicare Other

## 2020-03-09 ENCOUNTER — Other Ambulatory Visit: Payer: Self-pay

## 2020-03-09 DIAGNOSIS — I10 Essential (primary) hypertension: Secondary | ICD-10-CM

## 2020-03-09 DIAGNOSIS — D638 Anemia in other chronic diseases classified elsewhere: Secondary | ICD-10-CM | POA: Diagnosis not present

## 2020-03-09 DIAGNOSIS — E785 Hyperlipidemia, unspecified: Secondary | ICD-10-CM

## 2020-03-09 DIAGNOSIS — N185 Chronic kidney disease, stage 5: Secondary | ICD-10-CM | POA: Diagnosis not present

## 2020-03-10 LAB — LIPID PANEL
Cholesterol: 164 mg/dL (ref <200)
HDL: 50 mg/dL (ref >=50)
LDL Cholesterol (Calc): 95 mg/dL (calc)
Non-HDL Cholesterol (Calc): 114 mg/dL (calc) (ref <130)
Total CHOL/HDL Ratio: 3.3 (calc) (ref <5.0)
Triglycerides: 97 mg/dL (ref <150)

## 2020-03-10 LAB — COMPLETE METABOLIC PANEL WITH GFR
AG Ratio: 1.5 (calc) (ref 1.0–2.5)
ALT: 8 U/L (ref 6–29)
AST: 14 U/L (ref 10–35)
Albumin: 4.5 g/dL (ref 3.6–5.1)
Alkaline phosphatase (APISO): 116 U/L (ref 37–153)
BUN/Creatinine Ratio: 16 (calc) (ref 6–22)
BUN: 17 mg/dL (ref 7–25)
CO2: 22 mmol/L (ref 20–32)
Calcium: 9.8 mg/dL (ref 8.6–10.4)
Chloride: 108 mmol/L (ref 98–110)
Creat: 1.06 mg/dL — ABNORMAL HIGH (ref 0.50–0.99)
GFR, Est African American: 62 mL/min/{1.73_m2} (ref >=60)
GFR, Est Non African American: 54 mL/min/{1.73_m2} — ABNORMAL LOW (ref >=60)
Globulin: 3.1 g/dL (calc) (ref 1.9–3.7)
Glucose, Bld: 103 mg/dL — ABNORMAL HIGH (ref 65–99)
Potassium: 3.4 mmol/L — ABNORMAL LOW (ref 3.5–5.3)
Sodium: 144 mmol/L (ref 135–146)
Total Bilirubin: 0.6 mg/dL (ref 0.2–1.2)
Total Protein: 7.6 g/dL (ref 6.1–8.1)

## 2020-03-10 LAB — CBC WITH DIFFERENTIAL/PLATELET
Absolute Monocytes: 693 cells/uL (ref 200–950)
Basophils Absolute: 39 cells/uL (ref 0–200)
Basophils Relative: 0.7 %
Eosinophils Absolute: 110 cells/uL (ref 15–500)
Eosinophils Relative: 2 %
HCT: 40.1 % (ref 35.0–45.0)
Hemoglobin: 13.5 g/dL (ref 11.7–15.5)
Lymphs Abs: 1678 cells/uL (ref 850–3900)
MCH: 32.1 pg (ref 27.0–33.0)
MCHC: 33.7 g/dL (ref 32.0–36.0)
MCV: 95.5 fL (ref 80.0–100.0)
MPV: 10.8 fL (ref 7.5–12.5)
Monocytes Relative: 12.6 %
Neutro Abs: 2981 cells/uL (ref 1500–7800)
Neutrophils Relative %: 54.2 %
Platelets: 199 10*3/uL (ref 140–400)
RBC: 4.2 10*6/uL (ref 3.80–5.10)
RDW: 13.6 % (ref 11.0–15.0)
Total Lymphocyte: 30.5 %
WBC: 5.5 10*3/uL (ref 3.8–10.8)

## 2020-03-10 LAB — TSH: TSH: 1.71 mIU/L (ref 0.40–4.50)

## 2020-03-13 ENCOUNTER — Other Ambulatory Visit: Payer: Self-pay

## 2020-03-13 ENCOUNTER — Ambulatory Visit (INDEPENDENT_AMBULATORY_CARE_PROVIDER_SITE_OTHER): Payer: Medicare Other | Admitting: Family

## 2020-03-13 ENCOUNTER — Encounter: Payer: Self-pay | Admitting: Family

## 2020-03-13 VITALS — BP 110/78 | HR 70 | Temp 96.8°F | Resp 16 | Ht 64.0 in | Wt 125.8 lb

## 2020-03-13 DIAGNOSIS — Z23 Encounter for immunization: Secondary | ICD-10-CM

## 2020-03-13 DIAGNOSIS — E785 Hyperlipidemia, unspecified: Secondary | ICD-10-CM | POA: Diagnosis not present

## 2020-03-13 DIAGNOSIS — I10 Essential (primary) hypertension: Secondary | ICD-10-CM | POA: Diagnosis not present

## 2020-03-13 DIAGNOSIS — I739 Peripheral vascular disease, unspecified: Secondary | ICD-10-CM | POA: Diagnosis not present

## 2020-03-13 DIAGNOSIS — Z89612 Acquired absence of left leg above knee: Secondary | ICD-10-CM

## 2020-03-13 DIAGNOSIS — N185 Chronic kidney disease, stage 5: Secondary | ICD-10-CM

## 2020-03-13 DIAGNOSIS — Z1231 Encounter for screening mammogram for malignant neoplasm of breast: Secondary | ICD-10-CM

## 2020-03-13 DIAGNOSIS — E876 Hypokalemia: Secondary | ICD-10-CM

## 2020-03-13 MED ORDER — CLONIDINE HCL 0.1 MG PO TABS
0.1000 mg | ORAL_TABLET | Freq: Once | ORAL | Status: AC
  Administered 2020-03-13: 0.1 mg via ORAL

## 2020-03-13 NOTE — Progress Notes (Signed)
Provider: Marlowe Sax FNP-C   Maxmilian Trostel, Nelda Bucks, NP  Patient Care Team: Cris Gibby, Nelda Bucks, NP as PCP - General (Family Medicine) Nahser, Wonda Cheng, MD as PCP - Cardiology (Cardiology)  Extended Emergency Contact Information Primary Emergency Contact: Claudina Lick, [place] [address] [address]itter of [place] Phone: [phone] Relation: Niece Secondary Emergency Contact: Silvestre Mesi Home Phone: [phone] Relation: Sister  Code Status: Full Code  Goals of care: Advanced Directive information Advanced Directives 03/13/2020  Does Patient Have a Medical Advance Directive? No  Type of Advance Directive -  Does patient want to make changes to medical advance directive? -  Copy of [place] in Chart? -  Would patient like information on creating a medical advance directive? No - Patient declined     Chief Complaint  Patient presents with  . Medical Management of Chronic Issues    6 Month Follow Up/Discuss Labs  . Health Maintenance    Discuss the need for Mammogram.  . Immunizations    Discuss the need for Covid Vaccine and PNA Vaccine.     HPI:  Pt is a 69 y.o. female seen today for 6 months follow up for medical management of chronic diseases.she denies any issues this visit.she states has been using wheelchair to get around since post left AKA.She request referral to outpatient Physical Therapy to assist with use of left Prosthetic.States still afraid of using Prosthesis due to fear of falling.  Her recent lab results reviewed and discussed with her today.K+ 3.4 not on any diuretic. Hypertension - B/p elevated this visit though states did not take her morning B/p  medication prior to visit.On Amlodipine 5 mg tablet daily.Also on ASA 81 mg EC tablet daily and Atorvastatin 80 mg tablet daily.she denies any headache,dizziness,palpitation,fatigue,chest pain or shortness of breath. Currently on Coreg 3.125 mg tablet twice daily and amlodipine  5 mg tablet daily.also on ASA 81 mg EC tablet daily and Atorvastatin 80 mg tablet daily.   Hyperlipidemia - Chol 164,TRG 97,LDL 95 on Atorvastatin 80 mg tablet daily.she denies any muscle aches or weakness.  She is due for pneumonia 13 injection today. Has not had her COVID-19 vaccine but plans to get.it.Advised to get vaccine 2 weeks apart from PNA 13 vaccine.      Past Medical History:  Diagnosis Date  . Clotting disorder ([place])   . Hyperlipidemia   . Hypertension   . Peripheral vascular disease ([place])    vein surgery, left leg  . Stroke [facility]) 2002   ongoing mild loss of feeling in left side, had to learn to write right-handed  . Substance abuse ([place])   . Tobacco abuse    Past Surgical History:  Procedure Laterality Date  . AMPUTATION Left 07/01/2019   Procedure: AMPUTATION ABOVE KNEE, left;  Surgeon: Rosetta Posner, MD;  Location: [place];  Service: Vascular;  Laterality: Left;  . AORTA - BILATERAL FEMORAL ARTERY BYPASS GRAFT N/A 07/14/2016   Procedure: AORTOBIFEMORAL BYPASS GRAFT;  Surgeon: Waynetta Sandy, MD;  Location: [place];  Service: Vascular;  Laterality: N/A;  . AORTA - BILATERAL FEMORAL ARTERY BYPASS GRAFT Left 10/03/2018   Procedure: THROMBECTOMY OF LEFT LEG, LEFT FEMORAL-POPLITEAL BYPASS GRAFT, RIGHT TO LEFT FEMORAL-FEMORAL BYPASS GRAFT;  Surgeon: Rosetta Posner, MD;  Location: [place];  Service: Vascular;  Laterality: Left;  . CARDIAC CATHETERIZATION N/A 07/11/2016   Procedure: Left Heart Cath and Coronary Angiography;  Surgeon: Nelva Bush, MD;  Location: [place] [facility];  Service: Cardiovascular;  Laterality: N/A;  . FEMORAL-POPLITEAL BYPASS GRAFT Left 07/14/2016   Procedure: REDO BYPASS GRAFT FEMORAL-POPLITEAL ARTERY;  Surgeon: Waynetta Sandy, MD;  Location: [place];  Service: Vascular;  Laterality: Left;  . LOWER EXTREMITY ANGIOGRAPHY N/A 06/26/2019   Procedure: LOWER EXTREMITY ANGIOGRAPHY;  Surgeon: Marty Heck, MD;  Location: [place] [facility]  [facility];  Service: Cardiovascular;  Laterality: N/A;  . LOWER EXTREMITY INTERVENTION Left 06/27/2019   Procedure: LOWER EXTREMITY INTERVENTION;  Surgeon: Marty Heck, MD;  Location: [place] [facility];  Service: Cardiovascular;  Laterality: Left;  . PERIPHERAL VASCULAR CATHETERIZATION N/A 07/08/2016   Procedure: Abdominal Aortogram w/ Bilateral Lower Extremity Runoff;  Surgeon: Elam Dutch, MD;  Location: [place] [facility];  Service: Cardiovascular;  Laterality: N/A;  . PERIPHERAL VASCULAR THROMBECTOMY Left 06/27/2019   Procedure: LYSIS RECHECK;  Surgeon: Marty Heck, MD;  Location: [place] [facility];  Service: Cardiovascular;  Laterality: Left;  Marland Kitchen VEIN SURGERY Left     Allergies  Allergen Reactions  . Penicillins Swelling    Has patient had a PCN reaction causing immediate rash, facial/tongue/throat swelling, SOB or lightheadedness with hypotension: YES Has patient had a PCN reaction causing severe rash involving mucus membranes or skin necrosis: NO Has patient had a PCN reaction that required hospitalization NO Has patient had a PCN reaction occurring within the last 10 years: NO If all of the above answers are "NO", then may proceed with Cephalosporin use.    Allergies as of 03/13/2020      Reactions   Penicillins Swelling   Has patient had a PCN reaction causing immediate rash, facial/tongue/throat swelling, SOB or lightheadedness with hypotension: YES Has patient had a PCN reaction causing severe rash involving mucus membranes or skin necrosis: NO Has patient had a PCN reaction that required hospitalization NO Has patient had a PCN reaction occurring within the last 10 years: NO If all of the above answers are "NO", then may proceed with Cephalosporin use.      Medication List       Accurate as of March 13, 2020 11:59 PM. If you have any questions, ask your nurse or doctor.        amLODipine 5 MG tablet Commonly known as: NORVASC Take 1 tablet (5 mg total)  by mouth daily.   aspirin 81 MG EC tablet Take 1 tablet (81 mg total) by mouth daily. Swallow whole.   atorvastatin 80 MG tablet Commonly known as: LIPITOR Take 1 tablet (80 mg total) by mouth daily at 6 PM.   calcium-vitamin D 500-200 MG-UNIT tablet Commonly known as: OSCAL WITH D Take 1 tablet by mouth 2 (two) times daily.   carvedilol 3.125 MG tablet Commonly known as: COREG Take 1 tablet (3.125 mg total) by mouth 2 (two) times daily with a meal.   mineral oil-hydrophilic petrolatum ointment Apply topically 2 (two) times daily. Apply to lower extremities       Review of Systems  Constitutional: Negative for appetite change, chills, fatigue and fever.  HENT: Negative for congestion, rhinorrhea, sinus pressure, sinus pain, sneezing and sore throat.   Respiratory: Negative for cough, chest tightness, shortness of breath and wheezing.   Cardiovascular: Negative for chest pain, palpitations and leg swelling.  Gastrointestinal: Negative for abdominal distention, abdominal pain, constipation, diarrhea, nausea and vomiting.  Endocrine: Negative for cold intolerance, heat intolerance, polydipsia, polyphagia and polyuria.  Genitourinary: Negative for decreased urine volume, difficulty urinating, dysuria,  flank pain, frequency and urgency.  Musculoskeletal: Positive for gait problem.       Left BKA   Skin: Negative for color change, pallor, rash and wound.  Neurological: Negative for dizziness, syncope, speech difficulty, weakness, light-headedness, numbness and headaches.  Hematological: Does not bruise/bleed easily.  Psychiatric/Behavioral: Negative for agitation, confusion and sleep disturbance. The patient is not nervous/anxious.     Immunization History  Administered Date(s) Administered  . Fluad Quad(high Dose 65+) 09/03/2019  . Influenza, High Dose Seasonal PF 11/02/2018  . Influenza,inj,Quad PF,6+ Mos 09/30/2016, 07/19/2017  . Pneumococcal Conjugate-13 03/13/2020  .  Pneumococcal Polysaccharide-23 04/04/2017   Pertinent  Health Maintenance Due  Topic Date Due  . MAMMOGRAM  11/02/2019  . COLONOSCOPY  09/19/2028 (Originally 12/20/2001)  . INFLUENZA VACCINE  04/19/2020  . DEXA SCAN  Completed  . PNA vac Low Risk Adult  Completed   Fall Risk  03/13/2020 12/26/2019 12/19/2019 09/03/2019 07/23/2019  Falls in the past year? 0 0 0 0 0  Number falls in past yr: 0 0 0 0 0  Injury with Fall? 0 0 0 0 0    Vitals:   03/13/20 0854 03/13/20 1036  BP: (!) 140/100 110/78  Pulse: 70   Resp: 16   Temp: (!) 96.8 F (36 C)   SpO2: 98%   Weight: 125 lb 12.8 oz (57.1 kg)   Height: 5' 4"  (1.626 m)    Body mass index is 21.59 kg/m. Physical Exam Vitals reviewed.  Constitutional:      General: She is not in acute distress.    Appearance: She is normal weight. She is not ill-appearing.  HENT:     Head: Normocephalic.     Right Ear: Tympanic membrane, ear canal and external ear normal.     Left Ear: Tympanic membrane, ear canal and external ear normal. There is no impacted cerumen.     Nose: Nose normal. No congestion or rhinorrhea.     Mouth/Throat:     Mouth: Mucous membranes are moist.     Pharynx: Oropharynx is clear. No oropharyngeal exudate or posterior oropharyngeal erythema.  Eyes:     General: No scleral icterus.       Right eye: No discharge.        Left eye: No discharge.     Conjunctiva/sclera: Conjunctivae normal.     Pupils: Pupils are equal, round, and reactive to light.  Neck:     Vascular: No carotid bruit.  Cardiovascular:     Rate and Rhythm: Normal rate and regular rhythm.     Heart sounds: Normal heart sounds. No murmur heard.  No friction rub. No gallop.      Comments: Left AKA  Pulmonary:     Effort: Pulmonary effort is normal. No respiratory distress.     Breath sounds: Normal breath sounds. No wheezing, rhonchi or rales.  Chest:     Chest wall: No tenderness.  Abdominal:     General: Bowel sounds are normal. There is no  distension.     Palpations: Abdomen is soft. There is no mass.     Tenderness: There is no abdominal tenderness. There is no right CVA tenderness, left CVA tenderness, guarding or rebound.  Musculoskeletal:        General: No swelling or tenderness.     Cervical back: Normal range of motion. No rigidity or tenderness.     Right lower leg: No edema.     Comments: On wheelchair during visit.Left AKA   Lymphadenopathy:  Cervical: No cervical adenopathy.  Skin:    General: Skin is warm and dry.     Coloration: Skin is not pale.     Findings: No bruising, erythema, lesion or rash.  Neurological:     Mental Status: She is alert and oriented to person, place, and time.     Cranial Nerves: No cranial nerve deficit.     Motor: No weakness.  Psychiatric:        Mood and Affect: Mood normal.        Behavior: Behavior normal.        Thought Content: Thought content normal.        Judgment: Judgment normal.    Labs reviewed: Recent Labs    06/28/19 0108 06/28/19 0347 06/28/19 1600 06/29/19 0251 06/30/19 0510 07/07/19 0258 08/29/19 0854 03/09/20 0846  NA 137  --   --   --    < > 140 144 144  K 3.4*  --   --   --    < > 4.2 3.5 3.4*  CL 109  --   --   --    < > 108 105 108  CO2 18*  --   --   --    < > 21* 27 22  GLUCOSE 145*  --   --   --    < > 91 83 103*  BUN 25*  --   --   --    < > 6* 13 17  CREATININE 1.32*   < >  --  0.99   < > 0.95 1.05* 1.06*  CALCIUM 7.2*  --   --   --    < > 7.3* 9.7 9.8  MG 0.9*  --  2.5* 1.9  --   --   --   --    < > = values in this interval not displayed.   Recent Labs    07/01/19 0458 07/01/19 0458 07/02/19 1631 07/02/19 1631 07/03/19 0445 08/29/19 0854 03/09/20 0846  AST 22   < > 16   < > 16 18 14   ALT 12   < > 9   < > 9 13 8   ALKPHOS 75  --  56  --  61  --   --   BILITOT 0.8   < > 0.8   < > 1.0 0.7 0.6  PROT 5.3*   < > 5.0*   < > 4.9* 7.7 7.6  ALBUMIN 1.9*  --  1.8*  --  1.9*  --   --    < > = values in this interval not displayed.    Recent Labs    06/28/19 0108 06/28/19 0347 07/07/19 0258 08/29/19 0854 03/09/20 0846  WBC 24.5*   < > 7.3 5.6 5.5  NEUTROABS 20.9*  --   --  3,562 2,981  HGB 5.8*   < > 9.8* 11.9 13.5  HCT 16.9*   < > 27.5* 36.0 40.1  MCV 91.8   < > 90.2 93.5 95.5  PLT 218   < > 396 234 199   < > = values in this interval not displayed.   Lab Results  Component Value Date   TSH 1.71 03/09/2020   Lab Results  Component Value Date   HGBA1C 5.2 08/29/2019   Lab Results  Component Value Date   CHOL 164 03/09/2020   HDL 50 03/09/2020   LDLCALC 95 03/09/2020   TRIG 97 03/09/2020   CHOLHDL 3.3 03/09/2020  Significant Diagnostic Results in last 30 days:  No results found.  Assessment/Plan 1. Essential hypertension B/p elevated today but has not taken her morning blood pressure medication.clonidine 0.1 mg tablet given.B/p recheck normal. - Advised to check B/p at home and record.Notify provider if B/p > 140/90. - continue on Amlodipine 5 mg tablet daily.Also on ASA 81 mg EC tablet daily and Atorvastatin 80 mg tablet daily - CBC with Differential/Platelet; Future - CMP with eGFR([facility]); Future - TSH; Future - cloNIDine (CATAPRES) tablet 0.1 mg  2. Hyperlipidemia LDL goal <70 LDL not at goal. - continue on Atorvastatin 80 mg tablet daily.continue o dietary modification.  - Lipid panel; Future  3. PAD (peripheral artery disease) ([place]) Status post left AKA 07/01/2019.Request outpatient Physical therapy Prosthesis use. - continue on ASA 81 mg EC tablet daily and Atorvastatin 80 mg tablet daily  4. Chronic kidney disease, stage V (HCC) CR 1.06 previous 1.05 advised to continue to avoid nephrotoxins and dose for renal clearance. Aware to avoid naproxen,Aleve and ibuprofen.   5. Breast cancer screening by mammogram Reports no symptoms.  - MM DIGITAL SCREENING BILATERAL; Future  6. Hypokalemia K+ 3.4 not on any diuretic.Will recheck BMP then supplement if needed.Advised to increase  potassium rich foods like banana. - BMP with eGFR([facility]); Future in 2 weeks.  7. Status post above-knee amputation of left lower extremity [facility]) Request outpatient Physical Therapy for training with Prothesis  - Ambulatory referral to Physical Therapy  8. Need for pneumococcal vaccination Afebrile.Never had vaccine in the past. - Pneumococcal conjugate vaccine 13-valent IM administered by CMA.  Family/ staff Communication: Reviewed plan of care with patient verbalized understanding.   Labs/tests ordered:  - CBC with Differential/Platelet; Future - CMP with eGFR([facility]); Future - TSH; Future - Lipid panel; Future - BMP with eGFR([facility]) in 2 weeks. - MM DIGITAL SCREENING BILATERAL; Future  Next Appointment : 6 months for medical management of chronic issues. Fasting labs 2-4 days prior to visit.BMP in 2 weeks or sooner.   Wendy Hughs, NP

## 2020-03-21 ENCOUNTER — Other Ambulatory Visit: Payer: Self-pay | Admitting: Cardiovascular Disease

## 2020-03-25 ENCOUNTER — Other Ambulatory Visit: Payer: Medicare Other

## 2020-03-25 ENCOUNTER — Other Ambulatory Visit: Payer: Self-pay | Admitting: Family

## 2020-03-25 DIAGNOSIS — Z8673 Personal history of transient ischemic attack (TIA), and cerebral infarction without residual deficits: Secondary | ICD-10-CM

## 2020-03-31 ENCOUNTER — Other Ambulatory Visit: Payer: Medicare Other

## 2020-03-31 ENCOUNTER — Other Ambulatory Visit: Payer: Self-pay

## 2020-03-31 DIAGNOSIS — E876 Hypokalemia: Secondary | ICD-10-CM | POA: Diagnosis not present

## 2020-04-01 LAB — BASIC METABOLIC PANEL WITH GFR
BUN/Creatinine Ratio: 17 (calc) (ref 6–22)
BUN: 18 mg/dL (ref 7–25)
CO2: 28 mmol/L (ref 20–32)
Calcium: 9.6 mg/dL (ref 8.6–10.4)
Chloride: 105 mmol/L (ref 98–110)
Creat: 1.07 mg/dL — ABNORMAL HIGH (ref 0.50–0.99)
GFR, Est African American: 62 mL/min/{1.73_m2} (ref >=60)
GFR, Est Non African American: 53 mL/min/{1.73_m2} — ABNORMAL LOW (ref >=60)
Glucose, Bld: 101 mg/dL — ABNORMAL HIGH (ref 65–99)
Potassium: 4 mmol/L (ref 3.5–5.3)
Sodium: 141 mmol/L (ref 135–146)

## 2020-04-07 ENCOUNTER — Telehealth: Payer: Self-pay

## 2020-04-07 NOTE — Telephone Encounter (Signed)
Patient to make a lab appointment to draw Hgb A1C

## 2020-04-07 NOTE — Telephone Encounter (Signed)
A1c was not able to be added to labs from 03/31/20 because no "Hematology" tube was drawn, per Hassan Rowan, Margit Banda lab tech.

## 2020-04-07 NOTE — Progress Notes (Signed)
This encounter was created in error - please disregard.

## 2020-04-08 ENCOUNTER — Other Ambulatory Visit: Payer: Self-pay

## 2020-04-08 DIAGNOSIS — R7303 Prediabetes: Secondary | ICD-10-CM

## 2020-04-08 NOTE — Telephone Encounter (Signed)
Appointment made for 04/14/20 to have A1c drawn. Future lab order done.

## 2020-04-14 ENCOUNTER — Other Ambulatory Visit: Payer: Self-pay

## 2020-04-14 ENCOUNTER — Other Ambulatory Visit: Payer: Self-pay | Admitting: Cardiovascular Disease

## 2020-04-14 ENCOUNTER — Other Ambulatory Visit: Payer: Self-pay | Admitting: Family

## 2020-05-05 ENCOUNTER — Other Ambulatory Visit: Payer: Self-pay | Admitting: Cardiovascular Disease

## 2020-05-05 ENCOUNTER — Encounter: Payer: Self-pay | Admitting: Family

## 2020-05-26 ENCOUNTER — Telehealth: Payer: Self-pay

## 2020-05-26 ENCOUNTER — Ambulatory Visit (INDEPENDENT_AMBULATORY_CARE_PROVIDER_SITE_OTHER): Payer: Medicare Other | Admitting: Family

## 2020-05-26 ENCOUNTER — Other Ambulatory Visit: Payer: Self-pay

## 2020-05-26 ENCOUNTER — Encounter: Payer: Self-pay | Admitting: Family

## 2020-05-26 DIAGNOSIS — Z0000 Encounter for general adult medical examination without abnormal findings: Secondary | ICD-10-CM

## 2020-05-26 NOTE — Progress Notes (Signed)
This service is provided via telemedicine  No vital signs collected/recorded due to the encounter was a telemedicine visit.   Location of patient (ex: home, work): Home.  Patient consents to a telephone visit: Yes.  Location of the provider (ex: office, home):  [facility].  Name of any referring provider: N/A  Names of all persons participating in the telemedicine service and their role in the encounter: Patient, Heriberto Antigua, [place], [place], Webb Silversmith, NP.    Time spent on call: 8 minutes spent on the phone with Medical Assistant.     Subjective:   Wendy Townsend is a 69 y.o. female who presents for Medicare Annual (Subsequent) preventive examination.  Review of Systems     Cardiac Risk Factors include: advanced age (>32men, >62 women);hypertension;smoking/ tobacco exposure     Objective:    There were no vitals filed for this visit. There is no height or weight on file to calculate BMI.  Advanced Directives 05/26/2020 03/13/2020 07/30/2019 06/25/2019 06/25/2019 06/25/2019 10/03/2018  Does Patient Have a Medical Advance Directive? No No No No No No No  Type of Advance Directive - - - - - - -  Does patient want to make changes to medical advance directive? - - - - - - -  Copy of [place] in Chart? - - - - - - -  Would patient like information on creating a medical advance directive? No - Patient declined No - Patient declined No - Patient declined No - Patient declined No - Patient declined No - Patient declined No - Patient declined    Current Medications (verified) Outpatient Encounter Medications as of 05/26/2020  Medication Sig  . amLODipine (NORVASC) 5 MG tablet Take 1 tablet (5 mg total) by mouth daily.  . ASPIRIN LOW DOSE 81 MG EC tablet TAKE 1 TABLET (81 MG TOTAL) BY MOUTH DAILY. SWALLOW WHOLE.  . calcium-vitamin D (OSCAL WITH D) 500-200 MG-UNIT TABS tablet TAKE 1 TABLET BY MOUTH TWICE A DAY  . carvedilol (COREG) 3.125 MG tablet Take 1  tablet (3.125 mg total) by mouth 2 (two) times daily with a meal.  . mineral oil-hydrophilic petrolatum (AQUAPHOR) ointment Apply topically 2 (two) times daily. Apply to lower extremities  . [DISCONTINUED] atorvastatin (LIPITOR) 80 MG tablet Take 1 tablet (80 mg total) by mouth daily. Please make overdue appt with Dr. Acie Fredrickson before anymore refills. 2nd attempt   No facility-administered encounter medications on file as of 05/26/2020.    Allergies (verified) Penicillins   History: Past Medical History:  Diagnosis Date  . Clotting disorder ([place])   . Hyperlipidemia   . Hypertension   . Peripheral vascular disease ([place])    vein surgery, left leg  . Stroke [facility]) 2002   ongoing mild loss of feeling in left side, had to learn to write right-handed  . Substance abuse ([place])   . Tobacco abuse    Past Surgical History:  Procedure Laterality Date  . AMPUTATION Left 07/01/2019   Procedure: AMPUTATION ABOVE KNEE, left;  Surgeon: Rosetta Posner, MD;  Location: [place];  Service: Vascular;  Laterality: Left;  . AORTA - BILATERAL FEMORAL ARTERY BYPASS GRAFT N/A 07/14/2016   Procedure: AORTOBIFEMORAL BYPASS GRAFT;  Surgeon: Waynetta Sandy, MD;  Location: [place];  Service: Vascular;  Laterality: N/A;  . AORTA - BILATERAL FEMORAL ARTERY BYPASS GRAFT Left 10/03/2018   Procedure: THROMBECTOMY OF LEFT LEG, LEFT FEMORAL-POPLITEAL BYPASS GRAFT, RIGHT TO LEFT FEMORAL-FEMORAL BYPASS GRAFT;  Surgeon: Curt Jews  F, MD;  Location: [place];  Service: Vascular;  Laterality: Left;  . CARDIAC CATHETERIZATION N/A 07/11/2016   Procedure: Left Heart Cath and Coronary Angiography;  Surgeon: Nelva Bush, MD;  Location: [place] CV [facility];  Service: Cardiovascular;  Laterality: N/A;  . FEMORAL-POPLITEAL BYPASS GRAFT Left 07/14/2016   Procedure: REDO BYPASS GRAFT FEMORAL-POPLITEAL ARTERY;  Surgeon: Waynetta Sandy, MD;  Location: [place];  Service: Vascular;  Laterality: Left;  . LOWER EXTREMITY ANGIOGRAPHY  N/A 06/26/2019   Procedure: LOWER EXTREMITY ANGIOGRAPHY;  Surgeon: Marty Heck, MD;  Location: [place] CV [facility];  Service: Cardiovascular;  Laterality: N/A;  . LOWER EXTREMITY INTERVENTION Left 06/27/2019   Procedure: LOWER EXTREMITY INTERVENTION;  Surgeon: Marty Heck, MD;  Location: [place] [facility];  Service: Cardiovascular;  Laterality: Left;  . PERIPHERAL VASCULAR CATHETERIZATION N/A 07/08/2016   Procedure: Abdominal Aortogram w/ Bilateral Lower Extremity Runoff;  Surgeon: Elam Dutch, MD;  Location: [place] [facility];  Service: Cardiovascular;  Laterality: N/A;  . PERIPHERAL VASCULAR THROMBECTOMY Left 06/27/2019   Procedure: LYSIS RECHECK;  Surgeon: Marty Heck, MD;  Location: [place] [facility];  Service: Cardiovascular;  Laterality: Left;  Marland Kitchen VEIN SURGERY Left    Family History  Problem Relation Age of Onset  . Hypertension Mother 25  . Cancer Brother   . Cancer Sister   . Colon cancer Neg Hx   . Colon polyps Neg Hx   . Esophageal cancer Neg Hx   . Rectal cancer Neg Hx   . Stomach cancer Neg Hx    Social History   Socioeconomic History  . Marital status: Widowed    Spouse name: Not on file  . Number of children: Not on file  . Years of education: Not on file  . Highest education level: Not on file  Occupational History  . Occupation: retired  Tobacco Use  . Smoking status: Former Smoker    Years: 40.00    Types: Cigarettes    Quit date: 05/2017    Years since quitting: 3.0  . Smokeless tobacco: Never Used  . Tobacco comment: 10/04/2018 "stopped for awhile then started back"  Vaping Use  . Vaping Use: Never used  Substance and Sexual Activity  . Alcohol use: Not Currently    Comment: 2-3 times a year  . Drug use: Yes    Types: Marijuana    Comment: when she can't sleep sometimes at night  . Sexual activity: Not Currently  Other Topics Concern  . Not on file  Social History Narrative   DIET: None      DO YOU DRINK/EAT THINGS  WITH CAFFEINE: no      MARITAL STATUS: Widow      WHAT YEAR WERE YOU MARRIED: 1990      DO YOU LIVE IN A HOUSE, APARTMENT, ASSISTED LIVING, CONDO TRAILER ETC.:  House      IS IT ONE OR MORE STORIES: One      HOW MANY PERSONS LIVE IN YOUR HOME: 3      DO YOU HAVE PETS IN YOUR HOME: no      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      DO YOU EXERCISE: sometimes      WHAT TYPE AND HOW OFTEN: once a week   Social Determinants of Health   Financial Resource Strain:   . Difficulty of Paying Living Expenses: Not on file  Food Insecurity:   . Worried About Charity fundraiser in the Last Year:  Not on file  . Ran Out of Food in the Last Year: Not on file  Transportation Needs:   . Lack of Transportation (Medical): Not on file  . Lack of Transportation (Non-Medical): Not on file  Physical Activity:   . Days of Exercise per Week: Not on file  . Minutes of Exercise per Session: Not on file  Stress:   . Feeling of Stress : Not on file  Social Connections:   . Frequency of Communication with Friends and Family: Not on file  . Frequency of Social Gatherings with Friends and Family: Not on file  . Attends Religious Services: Not on file  . Active Member of Clubs or Organizations: Not on file  . Attends Archivist Meetings: Not on file  . Marital Status: Not on file    Tobacco Counseling Counseling given: Not Answered Comment: 10/04/2018 "stopped for awhile then started back"   Clinical Intake:  Pre-visit preparation completed: No  Pain : No/denies pain  BMI - recorded: 21.59 Nutritional Status: BMI of 19-24  Normal Nutritional Risks: None Diabetes: No  How often do you need to have someone help you when you read instructions, pamphlets, or other written materials from your doctor or pharmacy?: 1 - Never What is the last grade level you completed in school?: College  Diabetic? No  Interpreter Needed?: No  Information entered by :: Tyasia Packard  FNP-C   Activities of Daily Living In your present state of health, do you have any difficulty performing the following activities: 05/26/2020 06/25/2019  Hearing? N N  Vision? N N  Difficulty concentrating or making decisions? N N  Walking or climbing stairs? Y Y  Comment Has Prosthetic -  Dressing or bathing? N N  Doing errands, shopping? Tempie Donning  Medical sales representative and eating ? N -  Using the Toilet? N -  In the past six months, have you accidently leaked urine? N -  Do you have problems with loss of bowel control? N -  Managing your Medications? N -  Managing your Finances? N -  Housekeeping or managing your Housekeeping? Y -  Comment Has Chartered certified accountant -  Some recent data might be hidden    Patient Care Team: Shaia Porath, Nelda Bucks, NP as PCP - General (Family Medicine) Nahser, Wonda Cheng, MD as PCP - Cardiology (Cardiology)  Indicate any recent Medical Services you may have received from other than [facility] providers in the past year (date may be approximate).     Assessment:   This is a routine wellness examination for Wendy Townsend.  Hearing/Vision screen  Hearing Screening   125Hz  250Hz  500Hz  1000Hz  2000Hz  3000Hz  4000Hz  6000Hz  8000Hz   Right ear:           Left ear:           Comments: No Hearing Concerns.  Vision Screening Comments: No Vision Concerns. Patient wears prescription glasses.   Dietary issues and exercise activities discussed: Current Exercise Habits: Home exercise routine, Type of exercise: stretching, Time (Minutes): 30, Frequency (Times/Week): 3, Weekly Exercise (Minutes/Week): 90, Intensity: Mild, Exercise limited by: Other - see comments (wears prosthetic)  Goals    . <enter goal here>     Starting 09/30/16, I will maintain my current lifestyle.     . Patient Stated     I would like to be able to walk with prosthetic       Depression Screen [facility] 2/9 Scores 05/26/2020 12/26/2019 03/02/2018 11/25/2016 09/30/2016 06/08/2016  PHQ - 2 Score 0 0 0 0 0 0   PHQ- 9 Score - - - 0 - -    Fall Risk Fall Risk  05/26/2020 03/13/2020 12/26/2019 12/19/2019 09/03/2019  Falls in the past year? 0 0 0 0 0  Number falls in past yr: 0 0 0 0 0  Injury with Fall? 0 0 0 0 0    Any stairs in or around the home? No  If so, are there any without handrails? No  Home free of loose throw rugs in walkways, pet beds, electrical cords, etc? No  Adequate lighting in your home to reduce risk of falls? Yes   ASSISTIVE DEVICES UTILIZED TO PREVENT FALLS:  Life alert? No  Use of a cane, walker or w/c? Yes  Grab bars in the bathroom? Yes  Shower chair or bench in shower? Yes  Elevated toilet seat or a handicapped toilet? Yes   TIMED UP AND GO:  Was the test performed? No .  Length of time to ambulate 10 feet: N/A  sec.   Gait unsteady without use of assistive device, provider informed and interventions were implemented.Left AKA  Cognitive Function: MMSE - Mini Mental State Exam 03/02/2018 09/30/2016  Orientation to time 4 5  Orientation to Place 4 4  Registration 3 3  Attention/ Calculation 5 5  Recall 2 3  Language- name 2 objects 2 2  Language- repeat 1 1  Language- follow 3 step command 3 3  Language- read & follow direction 1 1  Write a sentence 1 1  Copy design 1 1  Total score 27 29     6CIT Screen 05/26/2020  What Year? 0 points  What month? 0 points  What time? 0 points  Count back from 20 0 points  Months in reverse 0 points  Repeat phrase 0 points  Total Score 0    Immunizations Immunization History  Administered Date(s) Administered  . Fluad Quad(high Dose 65+) 09/03/2019  . Influenza, High Dose Seasonal PF 11/02/2018  . Influenza,inj,Quad PF,6+ Mos 09/30/2016, 07/19/2017  . PFIZER SARS-COV-2 Vaccination 04/02/2020, 04/23/2020  . Pneumococcal Conjugate-13 03/13/2020  . Pneumococcal Polysaccharide-23 04/04/2017    TDAP status: Up to date Flu Vaccine status: Up to date Pneumococcal vaccine status: Up to date Covid-19 vaccine status:  Completed vaccines  Qualifies for Shingles Vaccine? Yes   Zostavax completed No   Shingrix Completed?: No.    Education has been provided regarding the importance of this vaccine. Patient has been advised to call insurance company to determine out of pocket expense if they have not yet received this vaccine. Advised may also receive vaccine at local pharmacy or Health Dept. Verbalized acceptance and understanding.  Screening Tests Health Maintenance  Topic Date Due  . MAMMOGRAM  11/02/2019  . INFLUENZA VACCINE  04/19/2020  . COLONOSCOPY  09/19/2028 (Originally 12/20/2001)  . TETANUS/TDAP  09/19/2028 (Originally 12/21/1970)  . DEXA SCAN  Completed  . COVID-19 Vaccine  Completed  . Hepatitis C Screening  Completed  . PNA vac Low Risk Adult  Completed    Health Maintenance  Health Maintenance Due  Topic Date Due  . MAMMOGRAM  11/02/2019  . INFLUENZA VACCINE  04/19/2020    Colorectal cancer screening: No longer required.  - Declined  Mammogram status: No longer required. - declined  Bone Density status: Ordered Has declined . Pt provided with contact info and advised to call to schedule appt. - Declined   Lung Cancer Screening: (Low Dose CT Chest recommended  if Age 106-80 years, 46 pack-year currently smoking OR have quit w/in 15years.) does not qualify.   Lung Cancer Screening Referral: N/A   Additional Screening:  Hepatitis C Screening: does not qualify; Completed yes   Vision Screening: Recommended annual ophthalmology exams for early detection of glaucoma and other disorders of the eye. Is the patient up to date with their annual eye exam?  No  Who is the provider or what is the name of the office in which the patient attends annual eye exams? Does not recall name  If pt is not established with a provider, would they like to be referred to a provider to establish care? No .   Dental Screening: Recommended annual dental exams for proper oral hygiene  Community Resource  Referral / Chronic Care Management: CRR required this visit? [email or web address] awaiting call back.States has a ramp built already.  CCM required this visit?  No     Plan:    - Has Declined referral for mammogram,Dexa scan,colonoscopy  - Influenza vaccine - Prosthetic use training ordered but has postponed still not used walking with Prosthetic but wears at home within the house but not outside.  I have personally reviewed and noted the following in the patient's chart:   . Medical and social history . Use of alcohol, tobacco or illicit drugs  . Current medications and supplements . Functional ability and status . Nutritional status . Physical activity . Advanced directives . List of other physicians . Hospitalizations, surgeries, and ER visits in previous 12 months . Vitals . Screenings to include cognitive, depression, and falls . Referrals and appointments  In addition, I have reviewed and discussed with patient certain preventive protocols, quality metrics, and best practice recommendations. A written personalized care plan for preventive services as well as general preventive health recommendations were provided to patient.   Sandrea Hughs, NP   05/26/2020   Nurse Notes: - advised to get Influenza vaccine

## 2020-05-26 NOTE — Telephone Encounter (Signed)
Wendy Townsend, Wendy Townsend are scheduled for a virtual visit with your provider today.    Just as we do with appointments in the office, we must obtain your consent to participate.  Your consent will be active for this visit and any virtual visit you may have with one of our providers in the next 365 days.    If you have a MyChart account, I can also send a copy of this consent to you electronically.  All virtual visits are billed to your insurance company just like a traditional visit in the office.  As this is a virtual visit, video technology does not allow for your provider to perform a traditional examination.  This may limit your provider's ability to fully assess your condition.  If your provider identifies any concerns that need to be evaluated in person or the need to arrange testing such as labs, EKG, etc, we will make arrangements to do so.    Although advances in technology are sophisticated, we cannot ensure that it will always work on either your end or our end.  If the connection with a video visit is poor, we may have to switch to a telephone visit.  With either a video or telephone visit, we are not always able to ensure that we have a secure connection.   I need to obtain your verbal consent now.   Are you willing to proceed with your visit today?   Wendy Townsend has provided verbal consent on 05/26/2020 for a virtual visit (video or telephone).   [place], [place] 05/26/2020  9:38 AM

## 2020-05-26 NOTE — Patient Instructions (Addendum)
[name] , Thank you for taking time to come for your Medicare Wellness Visit. I appreciate your ongoing commitment to your health goals. Please review the following plan we discussed and let me know if I can assist you in the future.   Screening recommendations/referrals: Colonoscopy: Declined  Mammogram : Declined  Bone Density : Declined  Recommended yearly ophthalmology/optometry visit for glaucoma screening and checkup Recommended yearly dental visit for hygiene and checkup  Vaccinations: Influenza vaccine: Annually  Pneumococcal vaccine : Up to date  Tdap vaccine: Up to date  Shingles vaccine : Please get shingles vaccine at your pharmacy     Advanced directives: No   Conditions/risks identified: Advance age female > 36 yrs,Hypertension,Hx of smoking  Next appointment: 1 year    Preventive Care 32 Years and Older, Female Preventive care refers to lifestyle choices and visits with your health care provider that can promote health and wellness. What does preventive care include?  A yearly physical exam. This is also called an annual well check.  Dental exams once or twice a year.  Routine eye exams. Ask your health care provider how often you should have your eyes checked.  Personal lifestyle choices, including:  Daily care of your teeth and gums.  Regular physical activity.  Eating a healthy diet.  Avoiding tobacco and drug use.  Limiting alcohol use.  Practicing safe sex.  Taking low-dose aspirin every day.  Taking vitamin and mineral supplements as recommended by your health care provider. What happens during an annual well check? The services and screenings done by your health care provider during your annual well check will depend on your age, overall health, lifestyle risk factors, and family history of disease. Counseling  Your health care provider may ask you questions about your:  Alcohol use.  Tobacco use.  Drug use.  Emotional  well-being.  Home and relationship well-being.  Sexual activity.  Eating habits.  History of falls.  Memory and ability to understand (cognition).  Work and work Statistician.  Reproductive health. Screening  You may have the following tests or measurements:  Height, weight, and BMI.  Blood pressure.  Lipid and cholesterol levels. These may be checked every 5 years, or more frequently if you are over 63 years old.  Skin check.  Lung cancer screening. You may have this screening every year starting at age 26 if you have a 30-pack-year history of smoking and currently smoke or have quit within the past 15 years.  Fecal occult blood test (FOBT) of the stool. You may have this test every year starting at age 35.  Flexible sigmoidoscopy or colonoscopy. You may have a sigmoidoscopy every 5 years or a colonoscopy every 10 years starting at age 80.  Hepatitis C blood test.  Hepatitis B blood test.  Sexually transmitted disease (STD) testing.  Diabetes screening. This is done by checking your blood sugar (glucose) after you have not eaten for a while (fasting). You may have this done every 1-3 years.  Bone density scan. This is done to screen for osteoporosis. You may have this done starting at age 67.  Mammogram. This may be done every 1-2 years. Talk to your health care provider about how often you should have regular mammograms. Talk with your health care provider about your test results, treatment options, and if necessary, the need for more tests. Vaccines  Your health care provider may recommend certain vaccines, such as:  Influenza vaccine. This is recommended every year.  Tetanus, diphtheria, and  acellular pertussis (Tdap, Td) vaccine. You may need a Td booster every 10 years.  Zoster vaccine. You may need this after age 39.  Pneumococcal 13-valent conjugate (PCV13) vaccine. One dose is recommended after age 19.  Pneumococcal polysaccharide (PPSV23) vaccine. One  dose is recommended after age 61. Talk to your health care provider about which screenings and vaccines you need and how often you need them. This information is not intended to replace advice given to you by your health care provider. Make sure you discuss any questions you have with your health care provider. Document Released: 10/02/2015 Document Revised: 05/25/2016 Document Reviewed: 07/07/2015 Elsevier Interactive Patient Education  2017 [place] Prevention in the Home Falls can cause injuries. They can happen to people of all ages. There are many things you can do to make your home safe and to help prevent falls. What can I do on the outside of my home?  Regularly fix the edges of walkways and driveways and fix any cracks.  Remove anything that might make you trip as you walk through a door, such as a raised step or threshold.  Trim any bushes or trees on the path to your home.  Use bright outdoor lighting.  Clear any walking paths of anything that might make someone trip, such as rocks or tools.  Regularly check to see if handrails are loose or broken. Make sure that both sides of any steps have handrails.  Any raised decks and porches should have guardrails on the edges.  Have any leaves, snow, or ice cleared regularly.  Use sand or salt on walking paths during winter.  Clean up any spills in your garage right away. This includes oil or grease spills. What can I do in the bathroom?  Use night lights.  Install grab bars by the toilet and in the tub and shower. Do not use towel bars as grab bars.  Use non-skid mats or decals in the tub or shower.  If you need to sit down in the shower, use a plastic, non-slip stool.  Keep the floor dry. Clean up any water that spills on the floor as soon as it happens.  Remove soap buildup in the tub or shower regularly.  Attach bath mats securely with double-sided non-slip rug tape.  Do not have throw rugs and other  things on the floor that can make you trip. What can I do in the bedroom?  Use night lights.  Make sure that you have a light by your bed that is easy to reach.  Do not use any sheets or blankets that are too big for your bed. They should not hang down onto the floor.  Have a firm chair that has side arms. You can use this for support while you get dressed.  Do not have throw rugs and other things on the floor that can make you trip. What can I do in the kitchen?  Clean up any spills right away.  Avoid walking on wet floors.  Keep items that you use a lot in easy-to-reach places.  If you need to reach something above you, use a strong step stool that has a grab bar.  Keep electrical cords out of the way.  Do not use floor polish or wax that makes floors slippery. If you must use wax, use non-skid floor wax.  Do not have throw rugs and other things on the floor that can make you trip. What can I do with my stairs?  Do not leave any items on the stairs.  Make sure that there are handrails on both sides of the stairs and use them. Fix handrails that are broken or loose. Make sure that handrails are as long as the stairways.  Check any carpeting to make sure that it is firmly attached to the stairs. Fix any carpet that is loose or worn.  Avoid having throw rugs at the top or bottom of the stairs. If you do have throw rugs, attach them to the floor with carpet tape.  Make sure that you have a light switch at the top of the stairs and the bottom of the stairs. If you do not have them, ask someone to add them for you. What else can I do to help prevent falls?  Wear shoes that:  Do not have high heels.  Have rubber bottoms.  Are comfortable and fit you well.  Are closed at the toe. Do not wear sandals.  If you use a stepladder:  Make sure that it is fully opened. Do not climb a closed stepladder.  Make sure that both sides of the stepladder are locked into place.  Ask  someone to hold it for you, if possible.  Clearly mark and make sure that you can see:  Any grab bars or handrails.  First and last steps.  Where the edge of each step is.  Use tools that help you move around (mobility aids) if they are needed. These include:  Canes.  Walkers.  Scooters.  Crutches.  Turn on the lights when you go into a dark area. Replace any light bulbs as soon as they burn out.  Set up your furniture so you have a clear path. Avoid moving your furniture around.  If any of your floors are uneven, fix them.  If there are any pets around you, be aware of where they are.  Review your medicines with your doctor. Some medicines can make you feel dizzy. This can increase your chance of falling. Ask your doctor what other things that you can do to help prevent falls. This information is not intended to replace advice given to you by your health care provider. Make sure you discuss any questions you have with your health care provider. Document Released: 07/02/2009 Document Revised: 02/11/2016 Document Reviewed: 10/10/2014 Elsevier Interactive Patient Education  2017 [employer].   [name] , Thank you for taking time to come for your Medicare Wellness Visit. I appreciate your ongoing commitment to your health goals. Please review the following plan we discussed and let me know if I can assist you in the future.   Screening recommendations/referrals: Colonoscopy Declined  Mammogram Declined Bone Density Declined Recommended yearly ophthalmology/optometry visit for glaucoma screening and checkup Recommended yearly dental visit for hygiene and checkup  Vaccinations: Influenza vaccine annuallly  Pneumococcal vaccine: Up to date  Tdap vaccine: Up to date  Shingles vaccine: Advised to get vaccine at her pharmacy     Advanced directives: No   Conditions/risks identified: Advanced age female > 104 yrs,Hypertension,hx of smoking   Next appointment: 1 year     Preventive Care 58 Years and Older, Female Preventive care refers to lifestyle choices and visits with your health care provider that can promote health and wellness. What does preventive care include?  A yearly physical exam. This is also called an annual well check.  Dental exams once or twice a year.  Routine eye exams. Ask your health care provider how often you should  have your eyes checked.  Personal lifestyle choices, including:  Daily care of your teeth and gums.  Regular physical activity.  Eating a healthy diet.  Avoiding tobacco and drug use.  Limiting alcohol use.  Practicing safe sex.  Taking low-dose aspirin every day.  Taking vitamin and mineral supplements as recommended by your health care provider. What happens during an annual well check? The services and screenings done by your health care provider during your annual well check will depend on your age, overall health, lifestyle risk factors, and family history of disease. Counseling  Your health care provider may ask you questions about your:  Alcohol use.  Tobacco use.  Drug use.  Emotional well-being.  Home and relationship well-being.  Sexual activity.  Eating habits.  History of falls.  Memory and ability to understand (cognition).  Work and work Statistician.  Reproductive health. Screening  You may have the following tests or measurements:  Height, weight, and BMI.  Blood pressure.  Lipid and cholesterol levels. These may be checked every 5 years, or more frequently if you are over 8 years old.  Skin check.  Lung cancer screening. You may have this screening every year starting at age 85 if you have a 30-pack-year history of smoking and currently smoke or have quit within the past 15 years.  Fecal occult blood test (FOBT) of the stool. You may have this test every year starting at age 73.  Flexible sigmoidoscopy or colonoscopy. You may have a sigmoidoscopy every 5  years or a colonoscopy every 10 years starting at age 69.  Hepatitis C blood test.  Hepatitis B blood test.  Sexually transmitted disease (STD) testing.  Diabetes screening. This is done by checking your blood sugar (glucose) after you have not eaten for a while (fasting). You may have this done every 1-3 years.  Bone density scan. This is done to screen for osteoporosis. You may have this done starting at age 49.  Mammogram. This may be done every 1-2 years. Talk to your health care provider about how often you should have regular mammograms. Talk with your health care provider about your test results, treatment options, and if necessary, the need for more tests. Vaccines  Your health care provider may recommend certain vaccines, such as:  Influenza vaccine. This is recommended every year.  Tetanus, diphtheria, and acellular pertussis (Tdap, Td) vaccine. You may need a Td booster every 10 years.  Zoster vaccine. You may need this after age 13.  Pneumococcal 13-valent conjugate (PCV13) vaccine. One dose is recommended after age 34.  Pneumococcal polysaccharide (PPSV23) vaccine. One dose is recommended after age 30. Talk to your health care provider about which screenings and vaccines you need and how often you need them. This information is not intended to replace advice given to you by your health care provider. Make sure you discuss any questions you have with your health care provider. Document Released: 10/02/2015 Document Revised: 05/25/2016 Document Reviewed: 07/07/2015 Elsevier Interactive Patient Education  2017 [place] Prevention in the Home Falls can cause injuries. They can happen to people of all ages. There are many things you can do to make your home safe and to help prevent falls. What can I do on the outside of my home?  Regularly fix the edges of walkways and driveways and fix any cracks.  Remove anything that might make you trip as you walk through  a door, such as a raised step or threshold.  Trim any  bushes or trees on the path to your home.  Use bright outdoor lighting.  Clear any walking paths of anything that might make someone trip, such as rocks or tools.  Regularly check to see if handrails are loose or broken. Make sure that both sides of any steps have handrails.  Any raised decks and porches should have guardrails on the edges.  Have any leaves, snow, or ice cleared regularly.  Use sand or salt on walking paths during winter.  Clean up any spills in your garage right away. This includes oil or grease spills. What can I do in the bathroom?  Use night lights.  Install grab bars by the toilet and in the tub and shower. Do not use towel bars as grab bars.  Use non-skid mats or decals in the tub or shower.  If you need to sit down in the shower, use a plastic, non-slip stool.  Keep the floor dry. Clean up any water that spills on the floor as soon as it happens.  Remove soap buildup in the tub or shower regularly.  Attach bath mats securely with double-sided non-slip rug tape.  Do not have throw rugs and other things on the floor that can make you trip. What can I do in the bedroom?  Use night lights.  Make sure that you have a light by your bed that is easy to reach.  Do not use any sheets or blankets that are too big for your bed. They should not hang down onto the floor.  Have a firm chair that has side arms. You can use this for support while you get dressed.  Do not have throw rugs and other things on the floor that can make you trip. What can I do in the kitchen?  Clean up any spills right away.  Avoid walking on wet floors.  Keep items that you use a lot in easy-to-reach places.  If you need to reach something above you, use a strong step stool that has a grab bar.  Keep electrical cords out of the way.  Do not use floor polish or wax that makes floors slippery. If you must use wax, use  non-skid floor wax.  Do not have throw rugs and other things on the floor that can make you trip. What can I do with my stairs?  Do not leave any items on the stairs.  Make sure that there are handrails on both sides of the stairs and use them. Fix handrails that are broken or loose. Make sure that handrails are as long as the stairways.  Check any carpeting to make sure that it is firmly attached to the stairs. Fix any carpet that is loose or worn.  Avoid having throw rugs at the top or bottom of the stairs. If you do have throw rugs, attach them to the floor with carpet tape.  Make sure that you have a light switch at the top of the stairs and the bottom of the stairs. If you do not have them, ask someone to add them for you. What else can I do to help prevent falls?  Wear shoes that:  Do not have high heels.  Have rubber bottoms.  Are comfortable and fit you well.  Are closed at the toe. Do not wear sandals.  If you use a stepladder:  Make sure that it is fully opened. Do not climb a closed stepladder.  Make sure that both sides of the stepladder are locked  into place.  Ask someone to hold it for you, if possible.  Clearly mark and make sure that you can see:  Any grab bars or handrails.  First and last steps.  Where the edge of each step is.  Use tools that help you move around (mobility aids) if they are needed. These include:  Canes.  Walkers.  Scooters.  Crutches.  Turn on the lights when you go into a dark area. Replace any light bulbs as soon as they burn out.  Set up your furniture so you have a clear path. Avoid moving your furniture around.  If any of your floors are uneven, fix them.  If there are any pets around you, be aware of where they are.  Review your medicines with your doctor. Some medicines can make you feel dizzy. This can increase your chance of falling. Ask your doctor what other things that you can do to help prevent falls. This  information is not intended to replace advice given to you by your health care provider. Make sure you discuss any questions you have with your health care provider. Document Released: 07/02/2009 Document Revised: 02/11/2016 Document Reviewed: 10/10/2014 Elsevier Interactive Patient Education  2017 [employer].

## 2020-07-03 ENCOUNTER — Other Ambulatory Visit: Payer: Self-pay | Admitting: Cardiovascular Disease

## 2020-07-03 NOTE — Telephone Encounter (Signed)
Pt's pharmacy is requesting a refill on atorvastatin, this medication was taken off of pt's medication list. I called the pt and pt stated that she is still taking this medication. Would Dr. Acie Fredrickson like to prescribe this medication again? Please address

## 2020-07-24 ENCOUNTER — Other Ambulatory Visit: Payer: Self-pay | Admitting: Internal Medicine

## 2020-08-19 DIAGNOSIS — M79672 Pain in left foot: Secondary | ICD-10-CM | POA: Diagnosis not present

## 2020-09-08 ENCOUNTER — Other Ambulatory Visit: Payer: Medicare Other

## 2020-09-15 ENCOUNTER — Ambulatory Visit: Payer: Medicare Other | Admitting: Family

## 2020-09-19 DIAGNOSIS — M79672 Pain in left foot: Secondary | ICD-10-CM | POA: Diagnosis not present

## 2020-09-22 ENCOUNTER — Other Ambulatory Visit: Payer: Self-pay

## 2020-09-22 ENCOUNTER — Ambulatory Visit: Payer: Medicare Other | Admitting: Family

## 2020-09-24 ENCOUNTER — Ambulatory Visit: Payer: Medicare Other | Admitting: Family

## 2020-09-30 ENCOUNTER — Ambulatory Visit (INDEPENDENT_AMBULATORY_CARE_PROVIDER_SITE_OTHER): Payer: Medicare Other | Admitting: Family

## 2020-09-30 ENCOUNTER — Encounter: Payer: Self-pay | Admitting: Family

## 2020-09-30 ENCOUNTER — Other Ambulatory Visit: Payer: Self-pay

## 2020-09-30 VITALS — BP 128/76 | HR 74 | Temp 96.9°F | Ht 64.0 in | Wt 125.8 lb

## 2020-09-30 DIAGNOSIS — I739 Peripheral vascular disease, unspecified: Secondary | ICD-10-CM | POA: Diagnosis not present

## 2020-09-30 DIAGNOSIS — Z23 Encounter for immunization: Secondary | ICD-10-CM | POA: Diagnosis not present

## 2020-09-30 DIAGNOSIS — N183 Chronic kidney disease, stage 3 unspecified: Secondary | ICD-10-CM | POA: Diagnosis not present

## 2020-09-30 DIAGNOSIS — N1831 Chronic kidney disease, stage 3a: Secondary | ICD-10-CM

## 2020-09-30 DIAGNOSIS — R7303 Prediabetes: Secondary | ICD-10-CM | POA: Diagnosis not present

## 2020-09-30 DIAGNOSIS — F17200 Nicotine dependence, unspecified, uncomplicated: Secondary | ICD-10-CM | POA: Diagnosis not present

## 2020-09-30 DIAGNOSIS — I129 Hypertensive chronic kidney disease with stage 1 through stage 4 chronic kidney disease, or unspecified chronic kidney disease: Secondary | ICD-10-CM | POA: Diagnosis not present

## 2020-09-30 DIAGNOSIS — E785 Hyperlipidemia, unspecified: Secondary | ICD-10-CM

## 2020-09-30 DIAGNOSIS — Z89612 Acquired absence of left leg above knee: Secondary | ICD-10-CM | POA: Diagnosis not present

## 2020-09-30 DIAGNOSIS — I10 Essential (primary) hypertension: Secondary | ICD-10-CM | POA: Diagnosis not present

## 2020-09-30 DIAGNOSIS — H6121 Impacted cerumen, right ear: Secondary | ICD-10-CM

## 2020-09-30 DIAGNOSIS — N185 Chronic kidney disease, stage 5: Secondary | ICD-10-CM | POA: Insufficient documentation

## 2020-09-30 MED ORDER — NICOTINE 14 MG/24HR TD PT24: MEDICATED_PATCH | TRANSDERMAL | 0 refills | Status: DC

## 2020-09-30 MED ORDER — DEBROX 6.5 % OT SOLN: 5.0000 [drp] | Freq: Two times a day (BID) | OTIC | 0 refills | Status: AC

## 2020-09-30 NOTE — Patient Instructions (Signed)
- instil debrox 6.5 % otic solution to right ear 5 drops twice daily x 4 days then follow up for ear lavage  - Nicotine patch Apply 14 mg patch topical daily for 6 weeks then  Apply 7 mg patch topically daily for 2 weeks then stop.Avoid smoking when wearing patch.   Nicotine skin patches What is this medicine? NICOTINE ([place]) helps people stop smoking. The patches replace the nicotine found in cigarettes and help to decrease withdrawal effects. They are most effective when used in combination with a stop-smoking program. This medicine may be used for other purposes; ask your health care provider or pharmacist if you have questions. COMMON BRAND NAME(S): Habitrol, Nicoderm CQ, Nicotrol What should I tell my health care provider before I take this medicine? They need to know if you have any of these conditions:  diabetes  heart disease, angina, irregular heartbeat or previous heart attack  high blood pressure  lung disease, including asthma  overactive thyroid  pheochromocytoma  seizures or a history of seizures  skin problems, like eczema  stomach problems or ulcers  an unusual or allergic reaction to nicotine, adhesives, other medicines, foods, dyes, or preservatives  pregnant or trying to get pregnant  breast-feeding How should I use this medicine? This medicine is for external use only. Follow the directions on the label. Do not cut or trim the patch. After applying the patch, wash your hands. Change the patch every day in the morning, keeping to a regular schedule. When you apply a new patch, use a new area of skin. Wait at least 1 week before using the same area again. This drug comes with INSTRUCTIONS FOR USE. Ask your pharmacist for directions on how to use this drug. Read the information carefully. Talk to your pharmacist or health care provider if you have questions. Talk to your pediatrician regarding the use of this medicine in children. Special care may be  needed. Overdosage: If you think you have taken too much of this medicine contact a poison control center or emergency room at once. NOTE: This medicine is only for you. Do not share this medicine with others. What if I miss a dose? If you forget to replace a patch, use it as soon as you can. Only use one patch at a time and do not leave on the skin for longer than directed. If a patch falls off, you can replace it, but keep to your schedule and remove the patch at the right time. What may interact with this medicine?  certain medicines for lung or breathing disease, like asthma  medicines for blood pressure  medicines for depression This list may not describe all possible interactions. Give your health care provider a list of all the medicines, herbs, non-prescription drugs, or dietary supplements you use. Also tell them if you smoke, drink alcohol, or use illegal drugs. Some items may interact with your medicine. What should I watch for while using this medicine? Visit your health care provider for regular checks on your progress. Talk to your health care provider about what you can do to improve your chances of quitting. If you are going to need surgery, an MRI, CT scan, or other procedure, tell your health care provider that you are using this drug. You may need to remove the patch before the procedure. What side effects may I notice from receiving this medicine? Side effects that you should report to your doctor or health care professional as soon as possible:  allergic reactions like skin rash, itching or hives, swelling of the face, lips, or tongue  breathing problems  changes in hearing  changes in vision  chest pain  cold sweats  confusion  fast, irregular heartbeat  feeling faint or lightheaded, falls  headache  increased saliva  skin redness that lasts more than 4 days  stomach pain  signs and symptoms of nicotine overdose like nausea; vomiting; dizziness;  weakness; and rapid heartbeat Side effects that usually do not require medical attention (report to your doctor or health care professional if they continue or are bothersome):  diarrhea  dry mouth  hiccups  irritability  nervousness or restlessness  trouble sleeping or vivid dreams This list may not describe all possible side effects. Call your doctor for medical advice about side effects. You may report side effects to FDA at 1-800-FDA-1088. Where should I keep my medicine? This product may have enough nicotine in it to make children and pets sick. Keep it away from children and pets. After using, throw away as directed on the package. Store at room temperature between 20 and 25 degrees C (68 and 77 degrees F). Protect from heat and light. Keep this medicine in the original container until ready to use. Throw away unused medicine after the expiration date. NOTE: This sheet is a summary. It may not cover all possible information. If you have questions about this medicine, talk to your doctor, pharmacist, or health care provider.  2021 Elsevier/Gold Standard (2019-08-22 16:54:39)

## 2020-09-30 NOTE — Progress Notes (Signed)
Provider: Marlowe Sax FNP-C   Elonda Giuliano, Nelda Bucks, NP  Patient Care Team: Kristion Holifield, Nelda Bucks, NP as PCP - General (Family Medicine) Nahser, Wonda Cheng, MD as PCP - Cardiology (Cardiology)  Extended Emergency Contact Information Primary Emergency Contact: Claudina Lick, [place] [address] Johnnette Litter of [place] Phone: [phone] Relation: Niece Secondary Emergency Contact: Silvestre Mesi Home Phone: [phone] Relation: Sister  Code Status:  Full Code  Goals of care: Advanced Directive information Advanced Directives 05/26/2020  Does Patient Have a Medical Advance Directive? No  Type of Advance Directive -  Does patient want to make changes to medical advance directive? -  Copy of [place] in Chart? -  Would patient like information on creating a medical advance directive? No - Patient declined     Chief Complaint  Patient presents with  . Acute Visit  . Medical Management of Chronic Issues    68-month F/U and fasting labs, no complaints  . Immunizations    Need for flu shot - will get today.   . Quality Metric Gaps    Need for mammogram - no documentation of a recent one and unsure when her last one was.  . Advanced Directive    No Advanced Directive    HPI:  Pt is a 70 y.o. female seen today for  6 months follow up for medical management of chronic diseases. She denies any acute issues.she continues to self propel on wheelchair.States lives with her brother who assist.  Hypertension - on Amlodipine 5 mg tablet daily and Coreg 3.125 mg tablet twice daily.On low ASA and Statin   Hyperlipidemia - LDL on chart 95 eats three meals per day and include fruits and vegetables.Has stretch bands that uses to exercise.   CKD stage 3 a  - latest CR 1.06 not on any Nephrotoxins   PAD - Bilateral amputee.on ASA and Statin.reports no ulcers on right leg.   Tobacco use - smokes 3 cigarettes daily.Still trying to cut down.Agrees to use nicotine  patch.States worn it during her previous hospital admission.   Has had no fall episode.uses a transfer board from wheelchair to bed. Has left leg Prosthesis but does not wear when out states makes thigh bigger than the right.Wears her shrinker.  Has had two COVID-19 vaccine.Has upcoming appointment for COVID-19 booster vaccine to be administered at home by her Pharmacy.  She is due for her influenza vaccine today.she denies any upper respiratory infection symptoms.   Has Pending lab orders that she missed appointment in December ,2021.she is fasting today will have labs drawn.      Past Medical History:  Diagnosis Date  . Clotting disorder ([place])   . Hyperlipidemia   . Hypertension   . Peripheral vascular disease ([place])    vein surgery, left leg  . Stroke [facility]) 2002   ongoing mild loss of feeling in left side, had to learn to write right-handed  . Substance abuse ([place])   . Tobacco abuse    Past Surgical History:  Procedure Laterality Date  . AMPUTATION Left 07/01/2019   Procedure: AMPUTATION ABOVE KNEE, left;  Surgeon: Rosetta Posner, MD;  Location: [place];  Service: Vascular;  Laterality: Left;  . AORTA - BILATERAL FEMORAL ARTERY BYPASS GRAFT N/A 07/14/2016   Procedure: AORTOBIFEMORAL BYPASS GRAFT;  Surgeon: Waynetta Sandy, MD;  Location: [place];  Service: Vascular;  Laterality: N/A;  . AORTA - BILATERAL FEMORAL ARTERY BYPASS GRAFT Left  10/03/2018   Procedure: THROMBECTOMY OF LEFT LEG, LEFT FEMORAL-POPLITEAL BYPASS GRAFT, RIGHT TO LEFT FEMORAL-FEMORAL BYPASS GRAFT;  Surgeon: Rosetta Posner, MD;  Location: [place];  Service: Vascular;  Laterality: Left;  . CARDIAC CATHETERIZATION N/A 07/11/2016   Procedure: Left Heart Cath and Coronary Angiography;  Surgeon: Nelva Bush, MD;  Location: [place] [facility];  Service: Cardiovascular;  Laterality: N/A;  . FEMORAL-POPLITEAL BYPASS GRAFT Left 07/14/2016   Procedure: REDO BYPASS GRAFT FEMORAL-POPLITEAL ARTERY;  Surgeon: Waynetta Sandy, MD;  Location: [place];  Service: Vascular;  Laterality: Left;  . LOWER EXTREMITY ANGIOGRAPHY N/A 06/26/2019   Procedure: LOWER EXTREMITY ANGIOGRAPHY;  Surgeon: Marty Heck, MD;  Location: [place] [facility];  Service: Cardiovascular;  Laterality: N/A;  . LOWER EXTREMITY INTERVENTION Left 06/27/2019   Procedure: LOWER EXTREMITY INTERVENTION;  Surgeon: Marty Heck, MD;  Location: [place] [facility];  Service: Cardiovascular;  Laterality: Left;  . PERIPHERAL VASCULAR CATHETERIZATION N/A 07/08/2016   Procedure: Abdominal Aortogram w/ Bilateral Lower Extremity Runoff;  Surgeon: Elam Dutch, MD;  Location: [place] [facility];  Service: Cardiovascular;  Laterality: N/A;  . PERIPHERAL VASCULAR THROMBECTOMY Left 06/27/2019   Procedure: LYSIS RECHECK;  Surgeon: Marty Heck, MD;  Location: [place] [facility];  Service: Cardiovascular;  Laterality: Left;  Marland Kitchen VEIN SURGERY Left     Allergies  Allergen Reactions  . Penicillins Swelling    Has patient had a PCN reaction causing immediate rash, facial/tongue/throat swelling, SOB or lightheadedness with hypotension: YES Has patient had a PCN reaction causing severe rash involving mucus membranes or skin necrosis: NO Has patient had a PCN reaction that required hospitalization NO Has patient had a PCN reaction occurring within the last 10 years: NO If all of the above answers are "NO", then may proceed with Cephalosporin use.    Allergies as of 09/30/2020      Reactions   Penicillins Swelling   Has patient had a PCN reaction causing immediate rash, facial/tongue/throat swelling, SOB or lightheadedness with hypotension: YES Has patient had a PCN reaction causing severe rash involving mucus membranes or skin necrosis: NO Has patient had a PCN reaction that required hospitalization NO Has patient had a PCN reaction occurring within the last 10 years: NO If all of the above answers are "NO", then may proceed with  Cephalosporin use.      Medication List       Accurate as of September 30, 2020 10:17 AM. If you have any questions, ask your nurse or doctor.        amLODipine 5 MG tablet Commonly known as: NORVASC Take 1 tablet (5 mg total) by mouth daily.   Aspirin Low Dose 81 MG EC tablet Generic drug: aspirin TAKE 1 TABLET (81 MG TOTAL) BY MOUTH DAILY. SWALLOW WHOLE.   atorvastatin 80 MG tablet Commonly known as: LIPITOR TAKE 1 TABLET (80 MG TOTAL) BY MOUTH DAILY. PLEASE MAKE OVERDUE APPT WITH DR. Acie Fredrickson BEFORE ANYMORE REFILLS. 2ND ATTEMPT   calcium-vitamin D 500-200 MG-UNIT Tabs tablet Commonly known as: OSCAL WITH D TAKE 1 TABLET BY MOUTH TWICE A DAY   carvedilol 3.125 MG tablet Commonly known as: COREG Take 1 tablet (3.125 mg total) by mouth 2 (two) times daily with a meal.   mineral oil-hydrophilic petrolatum ointment Apply topically 2 (two) times daily. Apply to lower extremities       Review of Systems  Constitutional: Negative for appetite change, chills, fatigue and fever.  HENT: Negative for  congestion, rhinorrhea, sinus pressure, sinus pain, sneezing, sore throat and trouble swallowing.   Eyes: Negative for discharge, redness and itching.  Respiratory: Negative for cough, chest tightness, shortness of breath and wheezing.   Cardiovascular: Negative for chest pain and palpitations.       Left AKA   Gastrointestinal: Negative for abdominal distention, abdominal pain, constipation, diarrhea, nausea and vomiting.  Endocrine: Negative for cold intolerance, heat intolerance, polydipsia, polyphagia and polyuria.  Genitourinary: Negative for difficulty urinating, dysuria, flank pain, frequency and urgency.  Musculoskeletal: Positive for gait problem. Negative for joint swelling.  Skin: Negative for color change, pallor and rash.  Neurological: Negative for dizziness, speech difficulty, weakness, light-headedness, numbness and headaches.  Hematological: Does not bruise/bleed  easily.  Psychiatric/Behavioral: Negative for agitation, confusion and sleep disturbance. The patient is not nervous/anxious.     Immunization History  Administered Date(s) Administered  . Fluad Quad(high Dose 65+) 09/03/2019  . Influenza, High Dose Seasonal PF 11/02/2018  . Influenza,inj,Quad PF,6+ Mos 09/30/2016, 07/19/2017  . PFIZER SARS-COV-2 Vaccination 04/02/2020, 04/23/2020  . Pneumococcal Conjugate-13 03/13/2020  . Pneumococcal Polysaccharide-23 04/04/2017   Pertinent  Health Maintenance Due  Topic Date Due  . MAMMOGRAM  11/02/2019  . INFLUENZA VACCINE  04/19/2020  . COLONOSCOPY (Pts 45-33yrs Insurance coverage will need to be confirmed)  09/19/2028 (Originally 12/20/1996)  . DEXA SCAN  Completed  . PNA vac Low Risk Adult  Completed   Fall Risk  09/30/2020 05/26/2020 03/13/2020 12/26/2019 12/19/2019  Falls in the past year? 0 0 0 0 0  Number falls in past yr: 0 0 0 0 0  Injury with Fall? - 0 0 0 0   Functional Status Survey:    Vitals:   09/30/20 0955  BP: 128/76  Pulse: 74  Temp: (!) 96.9 F (36.1 C)  TempSrc: Temporal  SpO2: 98%  Weight: 125 lb 12.8 oz (57.1 kg)  Height: 5\' 4"  (1.626 m)   Body mass index is 21.59 kg/m. Physical Exam Vitals reviewed.  Constitutional:      General: She is not in acute distress.    Appearance: Normal appearance. She is normal weight. She is not ill-appearing.  HENT:     Head: Normocephalic.     Right Ear: There is impacted cerumen.     Left Ear: Tympanic membrane, ear canal and external ear normal. There is no impacted cerumen.     Nose: Nose normal. No congestion or rhinorrhea.     Mouth/Throat:     Mouth: Mucous membranes are moist.     Pharynx: Oropharynx is clear. No oropharyngeal exudate or posterior oropharyngeal erythema.  Eyes:     General: No scleral icterus.       Right eye: No discharge.        Left eye: No discharge.     Extraocular Movements: Extraocular movements intact.     Conjunctiva/sclera: Conjunctivae  normal.     Pupils: Pupils are equal, round, and reactive to light.  Neck:     Vascular: No carotid bruit.  Cardiovascular:     Rate and Rhythm: Normal rate and regular rhythm.     Heart sounds: Normal heart sounds. No murmur heard. No friction rub. No gallop.   Pulmonary:     Effort: Pulmonary effort is normal. No respiratory distress.     Breath sounds: Normal breath sounds. No wheezing, rhonchi or rales.  Chest:     Chest wall: No tenderness.  Abdominal:     General: Bowel sounds are normal. There is  no distension.     Palpations: Abdomen is soft. There is no mass.     Tenderness: There is no abdominal tenderness. There is no right CVA tenderness, left CVA tenderness, guarding or rebound.  Musculoskeletal:        General: No swelling or tenderness. Normal range of motion.     Cervical back: Normal range of motion. No rigidity or tenderness.     Comments: Left Above knee amputee   Lymphadenopathy:     Cervical: No cervical adenopathy.  Skin:    General: Skin is warm and dry.     Coloration: Skin is not pale.     Findings: No bruising, erythema or rash.  Neurological:     Mental Status: She is alert and oriented to person, place, and time.     Cranial Nerves: No cranial nerve deficit.     Sensory: No sensory deficit.     Motor: No weakness.     Coordination: Coordination normal.     Gait: Gait abnormal.    Labs reviewed: Recent Labs    03/09/20 0846 03/31/20 0900  NA 144 141  K 3.4* 4.0  CL 108 105  CO2 22 28  GLUCOSE 103* 101*  BUN 17 18  CREATININE 1.06* 1.07*  CALCIUM 9.8 9.6   Recent Labs    03/09/20 0846  AST 14  ALT 8  BILITOT 0.6  PROT 7.6   Recent Labs    03/09/20 0846  WBC 5.5  NEUTROABS 2,981  HGB 13.5  HCT 40.1  MCV 95.5  PLT 199   Lab Results  Component Value Date   TSH 1.71 03/09/2020   Lab Results  Component Value Date   HGBA1C 5.2 08/29/2019   Lab Results  Component Value Date   CHOL 164 03/09/2020   HDL 50 03/09/2020    LDLCALC 95 03/09/2020   TRIG 97 03/09/2020   CHOLHDL 3.3 03/09/2020    Significant Diagnostic Results in last 30 days:  No results found.  Assessment/Plan 1. Benign hypertension with CKD (chronic kidney disease) stage III (HCC) B/p well controlled. - continue on Amlodipine 5 mg tablet daily. - continue on ASA and Statin  - counseled on Smoking cessation will try nicotine patch as below.    2. Hyperlipidemia LDL goal <70 Latest LDL 95 - continue on Atorvastatin 80 mg tablet daily  3. PAD (peripheral artery disease) (HCC) No ulcer noted. - Continue on  Atorvastatin 80 mg tablet daily and ASA 81 mg daily. - Smoking cessation   4. Stage 3a chronic kidney disease (HCC) CR at baseline.Has labs to recheck CR. - Advised to avoid nephrotoxins.  5. Need for influenza vaccination Afebrile.no symptoms of URI's.Flu vaccine administered by CMA  - Flu Vaccine QUAD High Dose(Fluad)  6. Tobacco use disorder Smoking cessation advised. - Will start on Nicotine patch.Instruction provided on AVS with additional education information.Advised to avoid smoking while wearing the patch.  - nicotine (NICODERM CQ - DOSED IN MG/24 HOURS) 14 mg/24hr patch; Apply 14 mg patch topical daily for 6 weeks then  Apply 7 mg patch topically daily for 2 weeks then stop.  Dispense: 42 patch; Refill: 0  7. Impacted cerumen of right ear TM not visualized. - Advised to instill 5 drops of debrox twice daily x 4 days then follow up for right ear lavage at the office.  - carbamide peroxide (DEBROX) 6.5 % OTIC solution; Place 5 drops into the right ear 2 (two) times daily for 4 days.  Dispense:  15 mL; Refill: 0   8. Status post above-knee amputation of left lower extremity (HCC) Left AKA did not wear prosthesis today due left  thigh being bigger than the right.  Family/ staff Communication: Reviewed plan of care with patient verbalized understanding.   Labs/tests ordered: Has lab orders pending to be drawn today.    Next Appointment : 6 months for medical management of chronic issues.5 days for right ear cerumen lavage.   Sandrea Hughs, NP

## 2020-10-01 ENCOUNTER — Telehealth: Payer: Self-pay | Admitting: Family

## 2020-10-01 ENCOUNTER — Other Ambulatory Visit: Payer: Self-pay

## 2020-10-01 DIAGNOSIS — I10 Essential (primary) hypertension: Secondary | ICD-10-CM

## 2020-10-01 DIAGNOSIS — E782 Mixed hyperlipidemia: Secondary | ICD-10-CM

## 2020-10-01 DIAGNOSIS — E785 Hyperlipidemia, unspecified: Secondary | ICD-10-CM

## 2020-10-01 DIAGNOSIS — R7303 Prediabetes: Secondary | ICD-10-CM

## 2020-10-01 LAB — CBC WITH DIFFERENTIAL/PLATELET
Absolute Monocytes: 766 cells/uL (ref 200–950)
Basophils Absolute: 48 cells/uL (ref 0–200)
Basophils Relative: 0.7 %
Eosinophils Absolute: 117 cells/uL (ref 15–500)
Eosinophils Relative: 1.7 %
HCT: 45.8 % — ABNORMAL HIGH (ref 35.0–45.0)
Hemoglobin: 15.8 g/dL — ABNORMAL HIGH (ref 11.7–15.5)
Lymphs Abs: 1283 cells/uL (ref 850–3900)
MCH: 32.4 pg (ref 27.0–33.0)
MCHC: 34.5 g/dL (ref 32.0–36.0)
MCV: 94 fL (ref 80.0–100.0)
MPV: 11 fL (ref 7.5–12.5)
Monocytes Relative: 11.1 %
Neutro Abs: 4685 cells/uL (ref 1500–7800)
Neutrophils Relative %: 67.9 %
Platelets: 217 10*3/uL (ref 140–400)
RBC: 4.87 10*6/uL (ref 3.80–5.10)
RDW: 13 % (ref 11.0–15.0)
Total Lymphocyte: 18.6 %
WBC: 6.9 10*3/uL (ref 3.8–10.8)

## 2020-10-01 LAB — HEMOGLOBIN A1C
Hgb A1c MFr Bld: 5.4 % of total Hgb (ref <5.7)
Mean Plasma Glucose: 108 mg/dL
eAG (mmol/L): 6 mmol/L

## 2020-10-01 LAB — COMPLETE METABOLIC PANEL WITH GFR
AG Ratio: 1.3 (calc) (ref 1.0–2.5)
ALT: 6 U/L (ref 6–29)
AST: 14 U/L (ref 10–35)
Albumin: 4.6 g/dL (ref 3.6–5.1)
Alkaline phosphatase (APISO): 106 U/L (ref 37–153)
BUN/Creatinine Ratio: 14 (calc) (ref 6–22)
BUN: 14 mg/dL (ref 7–25)
CO2: 25 mmol/L (ref 20–32)
Calcium: 10.3 mg/dL (ref 8.6–10.4)
Chloride: 104 mmol/L (ref 98–110)
Creat: 1.03 mg/dL — ABNORMAL HIGH (ref 0.50–0.99)
GFR, Est African American: 65 mL/min/{1.73_m2} (ref >=60)
GFR, Est Non African American: 56 mL/min/{1.73_m2} — ABNORMAL LOW (ref >=60)
Globulin: 3.5 g/dL (calc) (ref 1.9–3.7)
Glucose, Bld: 103 mg/dL — ABNORMAL HIGH (ref 65–99)
Potassium: 3.5 mmol/L (ref 3.5–5.3)
Sodium: 141 mmol/L (ref 135–146)
Total Bilirubin: 0.9 mg/dL (ref 0.2–1.2)
Total Protein: 8.1 g/dL (ref 6.1–8.1)

## 2020-10-01 LAB — LIPID PANEL
Cholesterol: 259 mg/dL — ABNORMAL HIGH (ref <200)
HDL: 54 mg/dL (ref >=50)
LDL Cholesterol (Calc): 176 mg/dL (calc) — ABNORMAL HIGH
Non-HDL Cholesterol (Calc): 205 mg/dL (calc) — ABNORMAL HIGH (ref <130)
Total CHOL/HDL Ratio: 4.8 (calc) (ref <5.0)
Triglycerides: 144 mg/dL (ref <150)

## 2020-10-01 LAB — TSH: TSH: 0.86 mIU/L (ref 0.40–4.50)

## 2020-10-01 NOTE — Telephone Encounter (Signed)
Patient states that she missed phone call from our office.  She thinks that the call was in regards to her lab results.  Please return patient's call.  Thank you  [employer]

## 2020-10-01 NOTE — Telephone Encounter (Signed)
Please call patient back for lab results.

## 2020-10-02 NOTE — Telephone Encounter (Signed)
Called patient and lab results discussed.

## 2020-10-20 DIAGNOSIS — M79672 Pain in left foot: Secondary | ICD-10-CM | POA: Diagnosis not present

## 2020-11-17 DIAGNOSIS — M79672 Pain in left foot: Secondary | ICD-10-CM | POA: Diagnosis not present

## 2020-12-16 ENCOUNTER — Telehealth: Payer: Self-pay | Admitting: *Deleted

## 2020-12-16 NOTE — Telephone Encounter (Signed)
Patient requested Handicap Placard for her Desha [phone]. Form filled out per Dinah.  Placed in Dinah's folder to review and sign.

## 2020-12-18 DIAGNOSIS — M79672 Pain in left foot: Secondary | ICD-10-CM | POA: Diagnosis not present

## 2020-12-30 ENCOUNTER — Other Ambulatory Visit: Payer: Self-pay | Admitting: Family

## 2020-12-30 DIAGNOSIS — I10 Essential (primary) hypertension: Secondary | ICD-10-CM

## 2021-01-08 NOTE — Telephone Encounter (Signed)
Handicap Placard was not signed. Patient's caregiver brought back to office, frustrated because they have been waiting in line at [facility].  Had Eunice signed.  Copy sent to scanning.

## 2021-01-17 DIAGNOSIS — M79672 Pain in left foot: Secondary | ICD-10-CM | POA: Diagnosis not present

## 2021-02-17 DIAGNOSIS — M79672 Pain in left foot: Secondary | ICD-10-CM | POA: Diagnosis not present

## 2021-03-19 DIAGNOSIS — M79672 Pain in left foot: Secondary | ICD-10-CM | POA: Diagnosis not present

## 2021-03-26 ENCOUNTER — Other Ambulatory Visit: Payer: Self-pay

## 2021-03-27 ENCOUNTER — Other Ambulatory Visit: Payer: Self-pay | Admitting: Family

## 2021-03-27 DIAGNOSIS — Z8673 Personal history of transient ischemic attack (TIA), and cerebral infarction without residual deficits: Secondary | ICD-10-CM

## 2021-03-30 ENCOUNTER — Ambulatory Visit: Payer: Medicare Other | Admitting: Family

## 2021-04-05 ENCOUNTER — Ambulatory Visit (INDEPENDENT_AMBULATORY_CARE_PROVIDER_SITE_OTHER): Payer: Medicare Other | Admitting: Family

## 2021-04-05 ENCOUNTER — Other Ambulatory Visit: Payer: Self-pay

## 2021-04-05 ENCOUNTER — Encounter: Payer: Self-pay | Admitting: Family

## 2021-04-05 VITALS — BP 140/98 | HR 69 | Temp 97.3°F | Resp 16 | Ht 64.0 in | Wt 148.2 lb

## 2021-04-05 DIAGNOSIS — I129 Hypertensive chronic kidney disease with stage 1 through stage 4 chronic kidney disease, or unspecified chronic kidney disease: Secondary | ICD-10-CM | POA: Diagnosis not present

## 2021-04-05 DIAGNOSIS — N183 Chronic kidney disease, stage 3 unspecified: Secondary | ICD-10-CM

## 2021-04-05 DIAGNOSIS — Z8673 Personal history of transient ischemic attack (TIA), and cerebral infarction without residual deficits: Secondary | ICD-10-CM

## 2021-04-05 DIAGNOSIS — R030 Elevated blood-pressure reading, without diagnosis of hypertension: Secondary | ICD-10-CM

## 2021-04-05 DIAGNOSIS — Z1231 Encounter for screening mammogram for malignant neoplasm of breast: Secondary | ICD-10-CM

## 2021-04-05 DIAGNOSIS — I739 Peripheral vascular disease, unspecified: Secondary | ICD-10-CM

## 2021-04-05 DIAGNOSIS — E663 Overweight: Secondary | ICD-10-CM

## 2021-04-05 DIAGNOSIS — I10 Essential (primary) hypertension: Secondary | ICD-10-CM | POA: Diagnosis not present

## 2021-04-05 DIAGNOSIS — R7303 Prediabetes: Secondary | ICD-10-CM

## 2021-04-05 DIAGNOSIS — E785 Hyperlipidemia, unspecified: Secondary | ICD-10-CM

## 2021-04-05 DIAGNOSIS — F17200 Nicotine dependence, unspecified, uncomplicated: Secondary | ICD-10-CM | POA: Diagnosis not present

## 2021-04-05 MED ORDER — CLONIDINE HCL 0.1 MG PO TABS
0.1000 mg | ORAL_TABLET | Freq: Once | ORAL | Status: AC
  Administered 2021-04-05: 0.1 mg via ORAL

## 2021-04-05 MED ORDER — CARVEDILOL 3.125 MG PO TABS: 3.1250 mg | ORAL_TABLET | Freq: Two times a day (BID) | ORAL | 1 refills | Status: DC

## 2021-04-05 NOTE — Progress Notes (Signed)
Location:      Place of Service:    Provider: Krislyn Donnan FNP-C   Breana Litts, Nelda Bucks, NP  Patient Care Team: Chaniah Cisse, Nelda Bucks, NP as PCP - General (Family Medicine) Nahser, Wonda Cheng, MD as PCP - Cardiology (Cardiology)  Extended Emergency Contact Information Primary Emergency Contact: Claudina Lick, [place] [address] [address] of [place] Phone: [phone] Relation: Niece Secondary Emergency Contact: [place] Phone: [phone] Relation: Sister  Code Status:  Full Code  Goals of care: Advanced Directive information Advanced Directives 04/05/2021  Does Patient Have a Medical Advance Directive? No  Type of Advance Directive -  Does patient want to make changes to medical advance directive? -  Copy of [employer] in Chart? -  Would patient like information on creating a medical advance directive? No - Patient declined     Chief Complaint  Patient presents with   Medical Management of Chronic Issues    6 month follow up.   Health Maintenance    Discuss the need for Mammogram.    Immunizations    Discuss the need for Covid Booster, and Shingrix vaccine.     HPI:  Pt is a 70 y.o. female seen today for medical management of chronic diseases.Has medical history of Hypertension with CKD 3,Hyperlipidemia,PAD,hx stroke,Tobacco disorder,Prediabetes,Anemia in chronic Kidney disease among other condition.  States has been doing well.Has not been able to use her left leg Prosthetic states makes her lean to the right when using the walker.Physical therapist worked with her also noted the leaning.thinks not in alignment with the right leg.will need to go to the Prosthetic place to readjust.she gets around with her wheelchair.   Has had weight gain 22.2 lbs over 6 months.Has been eating well.Tends to snack on sweets/cookies and drinks soda daily.she denies any cough,edema, orthopnea or shortness of breath  Has had no fall episode since  last visit  Her blood pressure is elevated today.Has not taken her Coreg for the past two months.states run out.denies any headache,dizziness,vision changes,fatigue,chest tightness,palpitation,chest pain or shortness of breath.    Due for her mammogram.reports no breast abnormalities.  Also due for shingrix and COVID-19  3 rd booster vaccine.aware to her vaccine at her pharmacy.   She continues to smoke sometimes one cigarette per day.States tries not to buy cigarettes.she did not purchase previous ordered Nicotine patch for smoking cessation states did not have 100 dollars to buy nicotine patch " Just too expensive".   Fasting today for lab work.    Past Medical History:  Diagnosis Date   Clotting disorder ([place])    Hyperlipidemia    Hypertension    Peripheral vascular disease ([place])    vein surgery, left leg   Stroke ([place]) 2002   ongoing mild loss of feeling in left side, had to learn to write right-handed   Substance abuse ([place])    Tobacco abuse    Past Surgical History:  Procedure Laterality Date   AMPUTATION Left 07/01/2019   Procedure: AMPUTATION ABOVE KNEE, left;  Surgeon: Rosetta Posner, MD;  Location: [facility];  Service: Vascular;  Laterality: Left;   AORTA - BILATERAL FEMORAL ARTERY BYPASS GRAFT N/A 07/14/2016   Procedure: AORTOBIFEMORAL BYPASS GRAFT;  Surgeon: Waynetta Sandy, MD;  Location: [facility];  Service: Vascular;  Laterality: N/A;   AORTA - BILATERAL FEMORAL ARTERY BYPASS GRAFT Left 10/03/2018   Procedure: THROMBECTOMY OF LEFT LEG, LEFT FEMORAL-POPLITEAL BYPASS GRAFT, RIGHT TO LEFT  FEMORAL-FEMORAL BYPASS GRAFT;  Surgeon: Rosetta Posner, MD;  Location: [facility];  Service: Vascular;  Laterality: Left;   CARDIAC CATHETERIZATION N/A 07/11/2016   Procedure: Left Heart Cath and Coronary Angiography;  Surgeon: Nelva Bush, MD;  Location: [facility];  Service: Cardiovascular;  Laterality: N/A;   FEMORAL-POPLITEAL BYPASS GRAFT Left 07/14/2016   Procedure: REDO  BYPASS GRAFT FEMORAL-POPLITEAL ARTERY;  Surgeon: Waynetta Sandy, MD;  Location: [facility];  Service: Vascular;  Laterality: Left;   LOWER EXTREMITY ANGIOGRAPHY N/A 06/26/2019   Procedure: LOWER EXTREMITY ANGIOGRAPHY;  Surgeon: Marty Heck, MD;  Location: [facility];  Service: Cardiovascular;  Laterality: N/A;   LOWER EXTREMITY INTERVENTION Left 06/27/2019   Procedure: LOWER EXTREMITY INTERVENTION;  Surgeon: Marty Heck, MD;  Location: [facility];  Service: Cardiovascular;  Laterality: Left;   PERIPHERAL VASCULAR CATHETERIZATION N/A 07/08/2016   Procedure: Abdominal Aortogram w/ Bilateral Lower Extremity Runoff;  Surgeon: Elam Dutch, MD;  Location: [facility];  Service: Cardiovascular;  Laterality: N/A;   PERIPHERAL VASCULAR THROMBECTOMY Left 06/27/2019   Procedure: LYSIS RECHECK;  Surgeon: Marty Heck, MD;  Location: [facility];  Service: Cardiovascular;  Laterality: Left;   VEIN SURGERY Left     Allergies  Allergen Reactions   Penicillins Swelling    Has patient had a PCN reaction causing immediate rash, facial/tongue/throat swelling, SOB or lightheadedness with hypotension: YES Has patient had a PCN reaction causing severe rash involving mucus membranes or skin necrosis: NO Has patient had a PCN reaction that required hospitalization NO Has patient had a PCN reaction occurring within the last 10 years: NO If all of the above answers are "NO", then may proceed with Cephalosporin use.    Allergies as of 04/05/2021       Reactions   Penicillins Swelling   Has patient had a PCN reaction causing immediate rash, facial/tongue/throat swelling, SOB or lightheadedness with hypotension: YES Has patient had a PCN reaction causing severe rash involving mucus membranes or skin necrosis: NO Has patient had a PCN reaction that required hospitalization NO Has patient had a PCN reaction occurring within the last 10 years: NO If all of  the above answers are "NO", then may proceed with Cephalosporin use.        Medication List        Accurate as of April 05, 2021 10:41 AM. If you have any questions, ask your nurse or doctor.          STOP taking these medications    nicotine 14 mg/24hr patch Commonly known as: NICODERM CQ - dosed in mg/24 hours Stopped by: Sandrea Hughs, NP       TAKE these medications    amLODipine 5 MG tablet Commonly known as: NORVASC Take 1 tablet (5 mg total) by mouth daily. DX I10   Aspirin Low Dose 81 MG EC tablet Generic drug: aspirin TAKE 1 TABLET (81 MG TOTAL) BY MOUTH DAILY. SWALLOW WHOLE.   atorvastatin 80 MG tablet Commonly known as: LIPITOR Take 1 tablet (80 mg total) by mouth daily.   calcium-vitamin D 500-200 MG-UNIT Tabs tablet Commonly known as: OSCAL WITH D TAKE 1 TABLET BY MOUTH TWICE A DAY   carvedilol 3.125 MG tablet Commonly known as: COREG Take 1 tablet (3.125 mg total) by mouth 2 (two) times daily with a meal.   mineral oil-hydrophilic petrolatum ointment Apply topically 2 (two) times daily. Apply to lower extremities  Review of Systems  Constitutional:  Negative for appetite change, chills, fatigue, fever and unexpected weight change.  HENT:  Negative for congestion, dental problem, ear discharge, ear pain, facial swelling, hearing loss, nosebleeds, postnasal drip, rhinorrhea, sinus pressure, sinus pain, sneezing, sore throat, tinnitus and trouble swallowing.   Eyes:  Negative for pain, discharge, redness, itching and visual disturbance.  Respiratory:  Negative for cough, chest tightness, shortness of breath and wheezing.   Cardiovascular:  Negative for chest pain, palpitations and leg swelling.  Gastrointestinal:  Negative for abdominal distention, abdominal pain, blood in stool, constipation, diarrhea, nausea and vomiting.  Endocrine: Negative for cold intolerance, heat intolerance, polydipsia, polyphagia and polyuria.  Genitourinary:   Negative for difficulty urinating, dysuria, flank pain, frequency and urgency.  Musculoskeletal:  Positive for gait problem. Negative for arthralgias, back pain, joint swelling, myalgias, neck pain and neck stiffness.  Skin:  Negative for color change, pallor, rash and wound.  Neurological:  Negative for dizziness, syncope, speech difficulty, weakness, light-headedness, numbness and headaches.  Hematological:  Does not bruise/bleed easily.  Psychiatric/Behavioral:  Negative for agitation, behavioral problems, confusion, hallucinations, self-injury, sleep disturbance and suicidal ideas. The patient is not nervous/anxious.    Immunization History  Administered Date(s) Administered   Fluad Quad(high Dose 65+) 09/03/2019, 09/30/2020   Influenza, High Dose Seasonal PF 11/02/2018   Influenza,inj,Quad PF,6+ Mos 09/30/2016, 07/19/2017   PFIZER(Purple Top)SARS-COV-2 Vaccination 04/02/2020, 04/23/2020   Pneumococcal Conjugate-13 03/13/2020   Pneumococcal Polysaccharide-23 04/04/2017   Pertinent  Health Maintenance Due  Topic Date Due   MAMMOGRAM  11/02/2019   COLONOSCOPY (Pts 45-73yr Insurance coverage will need to be confirmed)  09/19/2028 (Originally 12/20/1996)   INFLUENZA VACCINE  04/19/2021   DEXA SCAN  Completed   PNA vac Low Risk Adult  Completed   Fall Risk  04/05/2021 09/30/2020 05/26/2020 03/13/2020 12/26/2019  Falls in the past year? 0 0 0 0 0  Number falls in past yr: 0 0 0 0 0  Injury with Fall? 0 - 0 0 0  Risk for fall due to : No Fall Risks - - - -  Follow up Falls evaluation completed - - - -   Functional Status Survey:    Vitals:   04/05/21 0916 04/05/21 1040  BP: (!) 138/110 (!) 140/98  Pulse: 69   Resp: 16   Temp: (!) 97.3 F (36.3 C)   SpO2: 98%   Weight: 148 lb 3.2 oz (67.2 kg)   Height: '5\' 4"'$  (1.626 m)    Body mass index is 25.44 kg/m. Physical Exam Vitals reviewed.  Constitutional:      General: She is not in acute distress.    Appearance: Normal appearance.  She is overweight. She is not ill-appearing or diaphoretic.  HENT:     Head: Normocephalic.     Right Ear: Tympanic membrane, ear canal and external ear normal. There is no impacted cerumen.     Left Ear: Tympanic membrane, ear canal and external ear normal. There is no impacted cerumen.     Nose: Nose normal. No congestion or rhinorrhea.     Mouth/Throat:     Mouth: Mucous membranes are moist.     Pharynx: Oropharynx is clear. No oropharyngeal exudate or posterior oropharyngeal erythema.  Eyes:     General: No scleral icterus.       Right eye: No discharge.        Left eye: No discharge.     Extraocular Movements: Extraocular movements intact.     Conjunctiva/sclera: Conjunctivae  normal.     Pupils: Pupils are equal, round, and reactive to light.  Neck:     Vascular: No carotid bruit.  Cardiovascular:     Rate and Rhythm: Normal rate and regular rhythm.     Heart sounds: Normal heart sounds. No murmur heard.   No friction rub. No gallop.     Comments: Left AKA  Pulmonary:     Effort: Pulmonary effort is normal. No respiratory distress.     Breath sounds: Normal breath sounds. No wheezing, rhonchi or rales.  Chest:     Chest wall: No tenderness.  Abdominal:     General: Bowel sounds are normal. There is no distension.     Palpations: Abdomen is soft. There is no mass.     Tenderness: There is no abdominal tenderness. There is no right CVA tenderness, left CVA tenderness, guarding or rebound.  Musculoskeletal:        General: No swelling or tenderness. Normal range of motion.     Cervical back: Normal range of motion. No rigidity or tenderness.     Right lower leg: No edema.     Left Lower Extremity: Left leg is amputated above knee.  Lymphadenopathy:     Cervical: No cervical adenopathy.  Skin:    General: Skin is warm and dry.     Coloration: Skin is not pale.     Findings: No bruising, erythema, lesion or rash.  Neurological:     Mental Status: She is alert and oriented  to person, place, and time.     Cranial Nerves: No cranial nerve deficit.     Sensory: No sensory deficit.     Motor: No weakness.     Coordination: Coordination normal.     Gait: Gait abnormal.  Psychiatric:        Mood and Affect: Mood normal.        Speech: Speech normal.        Behavior: Behavior normal.        Thought Content: Thought content normal.        Judgment: Judgment normal.    Labs reviewed: Recent Labs    09/30/20 1038  NA 141  K 3.5  CL 104  CO2 25  GLUCOSE 103*  BUN 14  CREATININE 1.03*  CALCIUM 10.3   Recent Labs    09/30/20 1038  AST 14  ALT 6  BILITOT 0.9  PROT 8.1   Recent Labs    09/30/20 1038  WBC 6.9  NEUTROABS 4,685  HGB 15.8*  HCT 45.8*  MCV 94.0  PLT 217   Lab Results  Component Value Date   TSH 0.86 09/30/2020   Lab Results  Component Value Date   HGBA1C 5.4 09/30/2020   Lab Results  Component Value Date   CHOL 259 (H) 09/30/2020   HDL 54 09/30/2020   LDLCALC 176 (H) 09/30/2020   TRIG 144 09/30/2020   CHOLHDL 4.8 09/30/2020    Significant Diagnostic Results in last 30 days:  No results found.  Assessment/Plan  1. Hyperlipidemia LDL goal <70 Previous LDL not at goal.Has pending lab orders to recheck Lipid panel today  - dietary modification discussed at length. Exercise limited due to left AKA. Continue on Atorvastatin 80 mg tablet daily   2. Prediabetes Latest A1c was within normal range. Continue with dietary modification  3. Benign hypertension with CKD (chronic kidney disease) stage III (HCC) B/p elevated today though has been out of Carvedilol for the past two months.Advised  to notify her pharmacy or provider's office when out of her medication. - cloNIDine (CATAPRES) tablet 0.1 mg  - Rechecked B/p improved. - continue on amlodipine and Coreg - continue on ASA and Statin  - carvedilol (COREG) 3.125 MG tablet; Take 1 tablet (3.125 mg total) by mouth 2 (two) times daily with a meal.  Dispense: 180 tablet;  Refill: 1  4. PAD (peripheral artery disease) ([place]) - continue on ASA and Statin   5. Breast cancer screening by mammogram Asymptomatic Made aware imaging center will call her for appointment. - MM DIGITAL SCREENING BILATERAL; Future  6. Tobacco use disorder Smoking cessation advised.Nicotine patch previous prescribed but was too expensive for her.  7. History of CVA (cerebrovascular accident) Continue to control high risk factors   9. Overweight with body mass index (BMI) 25.0-29.9 BMI 25.44  Dietary modification as above   Family/ staff Communication: Reviewed plan of care with patient verbalized understanding   Labs/tests ordered: Has lab Orders in place    Next Appointment : 6 months for medical management of chronic issues.  Sandrea Hughs, NP

## 2021-04-05 NOTE — Patient Instructions (Signed)
https://www.nhlbi.nih.gov/files/docs/public/heart/dash_brief.pdf">  DASH Eating Plan DASH stands for Dietary Approaches to Stop Hypertension. The DASH eating plan is a healthy eating plan that has been shown to: Reduce high blood pressure (hypertension). Reduce your risk for type 2 diabetes, heart disease, and stroke. Help with weight loss. What are tips for following this plan? Reading food labels Check food labels for the amount of salt (sodium) per serving. Choose foods with less than 5 percent of the Daily Value of sodium. Generally, foods with less than 300 milligrams (mg) of sodium per serving fit into this eating plan. To find whole grains, look for the word "whole" as the first word in the ingredient list. Shopping Buy products labeled as "low-sodium" or "no salt added." Buy fresh foods. Avoid canned foods and pre-made or frozen meals. Cooking Avoid adding salt when cooking. Use salt-free seasonings or herbs instead of table salt or sea salt. Check with your health care provider or pharmacist before using salt substitutes. Do not fry foods. Cook foods using healthy methods such as baking, boiling, grilling, roasting, and broiling instead. Cook with heart-healthy oils, such as olive, canola, avocado, soybean, or sunflower oil. Meal planning  Eat a balanced diet that includes: 4 or more servings of fruits and 4 or more servings of vegetables each day. Try to fill one-half of your plate with fruits and vegetables. 6-8 servings of whole grains each day. Less than 6 oz (170 g) of lean meat, poultry, or fish each day. A 3-oz (85-g) serving of meat is about the same size as a deck of cards. One egg equals 1 oz (28 g). 2-3 servings of low-fat dairy each day. One serving is 1 cup (237 mL). 1 serving of nuts, seeds, or beans 5 times each week. 2-3 servings of heart-healthy fats. Healthy fats called omega-3 fatty acids are found in foods such as walnuts, flaxseeds, fortified milks, and eggs.  These fats are also found in cold-water fish, such as sardines, salmon, and mackerel. Limit how much you eat of: Canned or prepackaged foods. Food that is high in trans fat, such as some fried foods. Food that is high in saturated fat, such as fatty meat. Desserts and other sweets, sugary drinks, and other foods with added sugar. Full-fat dairy products. Do not salt foods before eating. Do not eat more than 4 egg yolks a week. Try to eat at least 2 vegetarian meals a week. Eat more home-cooked food and less restaurant, buffet, and fast food.  Lifestyle When eating at a restaurant, ask that your food be prepared with less salt or no salt, if possible. If you drink alcohol: Limit how much you use to: 0-1 drink a day for women who are not pregnant. 0-2 drinks a day for men. Be aware of how much alcohol is in your drink. In the [place] one drink equals one 12 oz bottle of beer (355 mL), one 5 oz glass of wine (148 mL), or one 1 oz glass of hard liquor (44 mL). General information Avoid eating more than 2,300 mg of salt a day. If you have hypertension, you may need to reduce your sodium intake to 1,500 mg a day. Work with your health care provider to maintain a healthy body weight or to lose weight. Ask what an ideal weight is for you. Get at least 30 minutes of exercise that causes your heart to beat faster (aerobic exercise) most days of the week. Activities may include walking, swimming, or biking. Work with your health care provider   or dietitian to adjust your eating plan to your individual calorie needs. What foods should I eat? Fruits All fresh, dried, or frozen fruit. Canned fruit in natural juice (without addedsugar). Vegetables Fresh or frozen vegetables (raw, steamed, roasted, or grilled). Low-sodium or reduced-sodium tomato and vegetable juice. Low-sodium or reduced-sodium tomatosauce and tomato paste. Low-sodium or reduced-sodium canned vegetables. Grains Whole-grain or  whole-wheat bread. Whole-grain or whole-wheat pasta. Brown rice. Oatmeal. Quinoa. Bulgur. Whole-grain and low-sodium cereals. Pita bread.Low-fat, low-sodium crackers. Whole-wheat flour tortillas. Meats and other proteins Skinless chicken or turkey. Ground chicken or turkey. Pork with fat trimmed off. Fish and seafood. Egg whites. Dried beans, peas, or lentils. Unsalted nuts, nut butters, and seeds. Unsalted canned beans. Lean cuts of beef with fat trimmed off. Low-sodium, lean precooked or cured meat, such as sausages or meatloaves. Dairy Low-fat (1%) or fat-free (skim) milk. Reduced-fat, low-fat, or fat-free cheeses. Nonfat, low-sodium ricotta or cottage cheese. Low-fat or nonfatyogurt. Low-fat, low-sodium cheese. Fats and oils Soft margarine without trans fats. Vegetable oil. Reduced-fat, low-fat, or light mayonnaise and salad dressings (reduced-sodium). Canola, safflower, olive, avocado, soybean, andsunflower oils. Avocado. Seasonings and condiments Herbs. Spices. Seasoning mixes without salt. Other foods Unsalted popcorn and pretzels. Fat-free sweets. The items listed above may not be a complete list of foods and beverages you can eat. Contact a dietitian for more information. What foods should I avoid? Fruits Canned fruit in a light or heavy syrup. Fried fruit. Fruit in cream or buttersauce. Vegetables Creamed or fried vegetables. Vegetables in a cheese sauce. Regular canned vegetables (not low-sodium or reduced-sodium). Regular canned tomato sauce and paste (not low-sodium or reduced-sodium). Regular tomato and vegetable juice(not low-sodium or reduced-sodium). Pickles. Olives. Grains Baked goods made with fat, such as croissants, muffins, or some breads. Drypasta or rice meal packs. Meats and other proteins Fatty cuts of meat. Ribs. Fried meat. Bacon. Bologna, salami, and other precooked or cured meats, such as sausages or meat loaves. Fat from the back of a pig (fatback). Bratwurst.  Salted nuts and seeds. Canned beans with added salt. Canned orsmoked fish. Whole eggs or egg yolks. Chicken or turkey with skin. Dairy Whole or 2% milk, cream, and half-and-half. Whole or full-fat cream cheese. Whole-fat or sweetened yogurt. Full-fat cheese. Nondairy creamers. Whippedtoppings. Processed cheese and cheese spreads. Fats and oils Butter. Stick margarine. Lard. Shortening. Ghee. Bacon fat. Tropical oils, suchas coconut, palm kernel, or palm oil. Seasonings and condiments Onion salt, garlic salt, seasoned salt, table salt, and sea salt. Worcestershire sauce. Tartar sauce. Barbecue sauce. Teriyaki sauce. Soy sauce, including reduced-sodium. Steak sauce. Canned and packaged gravies. Fish sauce. Oyster sauce. Cocktail sauce. Store-bought horseradish. Ketchup. Mustard. Meat flavorings and tenderizers. Bouillon cubes. Hot sauces. Pre-made or packaged marinades. Pre-made or packaged taco seasonings. Relishes. Regular saladdressings. Other foods Salted popcorn and pretzels. The items listed above may not be a complete list of foods and beverages you should avoid. Contact a dietitian for more information. Where to find more information National Heart, Lung, and Blood Institute: www.nhlbi.nih.gov American Heart Association: www.heart.org Academy of Nutrition and Dietetics: www.eatright.org National Kidney Foundation: www.kidney.org Summary The DASH eating plan is a healthy eating plan that has been shown to reduce high blood pressure (hypertension). It may also reduce your risk for type 2 diabetes, heart disease, and stroke. When on the DASH eating plan, aim to eat more fresh fruits and vegetables, whole grains, lean proteins, low-fat dairy, and heart-healthy fats. With the DASH eating plan, you should limit salt (sodium) intake to 2,300   mg a day. If you have hypertension, you may need to reduce your sodium intake to 1,500 mg a day. Work with your health care provider or dietitian to adjust  your eating plan to your individual calorie needs. This information is not intended to replace advice given to you by your health care provider. Make sure you discuss any questions you have with your healthcare provider. Document Revised: 08/09/2019 Document Reviewed: 08/09/2019 Elsevier Patient Education  2022 [employer]  

## 2021-04-06 LAB — TSH: TSH: 2.4 mIU/L (ref 0.40–4.50)

## 2021-04-06 LAB — CBC WITH DIFFERENTIAL/PLATELET
Absolute Monocytes: 713 cells/uL (ref 200–950)
Basophils Absolute: 51 cells/uL (ref 0–200)
Basophils Relative: 0.9 %
Eosinophils Absolute: 160 cells/uL (ref 15–500)
Eosinophils Relative: 2.8 %
HCT: 40.4 % (ref 35.0–45.0)
Hemoglobin: 14 g/dL (ref 11.7–15.5)
Lymphs Abs: 1830 cells/uL (ref 850–3900)
MCH: 32.3 pg (ref 27.0–33.0)
MCHC: 34.7 g/dL (ref 32.0–36.0)
MCV: 93.3 fL (ref 80.0–100.0)
MPV: 10.5 fL (ref 7.5–12.5)
Monocytes Relative: 12.5 %
Neutro Abs: 2947 cells/uL (ref 1500–7800)
Neutrophils Relative %: 51.7 %
Platelets: 212 10*3/uL (ref 140–400)
RBC: 4.33 10*6/uL (ref 3.80–5.10)
RDW: 12.7 % (ref 11.0–15.0)
Total Lymphocyte: 32.1 %
WBC: 5.7 10*3/uL (ref 3.8–10.8)

## 2021-04-06 LAB — COMPLETE METABOLIC PANEL WITH GFR
AG Ratio: 1.4 (calc) (ref 1.0–2.5)
ALT: 13 U/L (ref 6–29)
AST: 18 U/L (ref 10–35)
Albumin: 4.5 g/dL (ref 3.6–5.1)
Alkaline phosphatase (APISO): 120 U/L (ref 37–153)
BUN/Creatinine Ratio: 18 (calc) (ref 6–22)
BUN: 20 mg/dL (ref 7–25)
CO2: 25 mmol/L (ref 20–32)
Calcium: 10.1 mg/dL (ref 8.6–10.4)
Chloride: 106 mmol/L (ref 98–110)
Creat: 1.11 mg/dL — ABNORMAL HIGH (ref 0.50–1.05)
Globulin: 3.3 g/dL (calc) (ref 1.9–3.7)
Glucose, Bld: 97 mg/dL (ref 65–99)
Potassium: 3.3 mmol/L — ABNORMAL LOW (ref 3.5–5.3)
Sodium: 140 mmol/L (ref 135–146)
Total Bilirubin: 0.6 mg/dL (ref 0.2–1.2)
Total Protein: 7.8 g/dL (ref 6.1–8.1)
eGFR: 54 mL/min/{1.73_m2} — ABNORMAL LOW (ref >=60)

## 2021-04-06 LAB — LIPID PANEL
Cholesterol: 123 mg/dL (ref <200)
HDL: 50 mg/dL (ref >=50)
LDL Cholesterol (Calc): 52 mg/dL (calc)
Non-HDL Cholesterol (Calc): 73 mg/dL (calc) (ref <130)
Total CHOL/HDL Ratio: 2.5 (calc) (ref <5.0)
Triglycerides: 124 mg/dL (ref <150)

## 2021-04-06 LAB — HEMOGLOBIN A1C
Hgb A1c MFr Bld: 5.5 % of total Hgb (ref <5.7)
Mean Plasma Glucose: 111 mg/dL
eAG (mmol/L): 6.2 mmol/L

## 2021-04-19 DIAGNOSIS — M79672 Pain in left foot: Secondary | ICD-10-CM | POA: Diagnosis not present

## 2021-05-28 ENCOUNTER — Other Ambulatory Visit: Payer: Self-pay

## 2021-05-28 ENCOUNTER — Encounter: Payer: Self-pay | Admitting: Family

## 2021-05-28 ENCOUNTER — Ambulatory Visit (INDEPENDENT_AMBULATORY_CARE_PROVIDER_SITE_OTHER): Payer: Medicare Other | Admitting: Family

## 2021-05-28 DIAGNOSIS — Z0000 Encounter for general adult medical examination without abnormal findings: Secondary | ICD-10-CM

## 2021-05-28 DIAGNOSIS — Z23 Encounter for immunization: Secondary | ICD-10-CM | POA: Diagnosis not present

## 2021-05-28 MED ORDER — TETANUS-DIPHTH-ACELL PERTUSSIS 5-2.5-18.5 LF-MCG/0.5 IM SUSP: 0.5000 mL | Freq: Once | INTRAMUSCULAR | 0 refills | Status: AC

## 2021-05-28 NOTE — Progress Notes (Signed)
This service is provided via telemedicine  No vital signs collected/recorded due to the encounter was a telemedicine visit.   Location of patient (ex: home, work):  Home.  Patient consents to a telephone visit:  Yes   Location of the provider (ex: office, home):  [employer].   Name of any referring provider:  Nelton Amsden, Nelda Bucks, NP   Names of all persons participating in the telemedicine service and their role in the encounter:  Patient, Heriberto Antigua, [place], [place], Webb Silversmith, NP.    Time spent on call: 8 minutes spent on the phone with Medical Assistant.      Subjective:   Wendy Townsend is a 70 y.o. female who presents for Medicare Annual (Subsequent) preventive examination.  Review of Systems     Cardiac Risk Factors include: hypertension;dyslipidemia;smoking/ tobacco exposure;advanced age (>68mn, >>39women)     Objective:    There were no vitals filed for this visit. There is no height or weight on file to calculate BMI.  Advanced Directives 05/28/2021 04/05/2021 05/26/2020 03/13/2020 07/30/2019 06/25/2019 06/25/2019  Does Patient Have a Medical Advance Directive? No No No No No No No  Type of Advance Directive - - - - - - -  Does patient want to make changes to medical advance directive? - - - - - - -  Copy of [place]? - - - - - - -  Would patient like information on creating a medical advance directive? No - Patient declined No - Patient declined No - Patient declined No - Patient declined No - Patient declined No - Patient declined No - Patient declined    Current Medications (verified) Outpatient Encounter Medications as of 05/28/2021  Medication Sig   amLODipine (NORVASC) 5 MG tablet Take 1 tablet (5 mg total) by mouth daily. DX I10   ASPIRIN LOW DOSE 81 MG EC tablet TAKE 1 TABLET (81 MG TOTAL) BY MOUTH DAILY. SWALLOW WHOLE.   atorvastatin (LIPITOR) 80 MG tablet Take 1 tablet (80 mg total) by mouth daily.   calcium-vitamin D  (OSCAL WITH D) 500-200 MG-UNIT TABS tablet TAKE 1 TABLET BY MOUTH TWICE A DAY   carvedilol (COREG) 3.125 MG tablet Take 1 tablet (3.125 mg total) by mouth 2 (two) times daily with a meal.   mineral oil-hydrophilic petrolatum (AQUAPHOR) ointment Apply topically 2 (two) times daily. Apply to lower extremities   No facility-administered encounter medications on file as of 05/28/2021.    Allergies (verified) Penicillins   History: Past Medical History:  Diagnosis Date   Clotting disorder ([place]    Hyperlipidemia    Hypertension    Peripheral vascular disease ([facility]    vein surgery, left leg   [place] ([place]   ongoing mild loss of feeling in left side, had to learn to write right-handed   Substance abuse ([facility]    Tobacco abuse    Past Surgical History:  Procedure Laterality Date   AMPUTATION Left 07/01/2019   Procedure: AMPUTATION ABOVE KNEE, left;  Surgeon: ERosetta Posner MD;  [facility]  Service: Vascular;  Laterality: Left;   AORTA - BILATERAL FEMORAL ARTERY BYPASS GRAFT N/A 07/14/2016   Procedure: AORTOBIFEMORAL BYPASS GRAFT;  Surgeon: BWaynetta Sandy MD;  [facility]  Service: Vascular;  Laterality: N/A;   AORTA - BILATERAL FEMORAL ARTERY BYPASS GRAFT Left 10/03/2018   Procedure: THROMBECTOMY OF LEFT LEG, LEFT FEMORAL-POPLITEAL BYPASS GRAFT, RIGHT TO LEFT FEMORAL-FEMORAL BYPASS GRAFT;  Surgeon: ERosetta Posner  MD;  Location: [facility];  Service: Vascular;  Laterality: Left;   CARDIAC CATHETERIZATION N/A 07/11/2016   Procedure: Left Heart Cath and Coronary Angiography;  Surgeon: Nelva Bush, MD;  Location: [facility];  Service: Cardiovascular;  Laterality: N/A;   FEMORAL-POPLITEAL BYPASS GRAFT Left 07/14/2016   Procedure: REDO BYPASS GRAFT FEMORAL-POPLITEAL ARTERY;  Surgeon: Waynetta Sandy, MD;  Location: [facility];  Service: Vascular;  Laterality: Left;   LOWER EXTREMITY ANGIOGRAPHY N/A 06/26/2019   Procedure: LOWER EXTREMITY ANGIOGRAPHY;  Surgeon:  Marty Heck, MD;  Location: [facility];  Service: Cardiovascular;  Laterality: N/A;   LOWER EXTREMITY INTERVENTION Left 06/27/2019   Procedure: LOWER EXTREMITY INTERVENTION;  Surgeon: Marty Heck, MD;  Location: [facility];  Service: Cardiovascular;  Laterality: Left;   PERIPHERAL VASCULAR CATHETERIZATION N/A 07/08/2016   Procedure: Abdominal Aortogram w/ Bilateral Lower Extremity Runoff;  Surgeon: Elam Dutch, MD;  Location: [facility];  Service: Cardiovascular;  Laterality: N/A;   PERIPHERAL VASCULAR THROMBECTOMY Left 06/27/2019   Procedure: LYSIS RECHECK;  Surgeon: Marty Heck, MD;  Location: [facility];  Service: Cardiovascular;  Laterality: Left;   VEIN SURGERY Left    Family History  Problem Relation Age of Onset   Hypertension Mother 5   Cancer Brother    Cancer Sister    Colon cancer Neg Hx    Colon polyps Neg Hx    Esophageal cancer Neg Hx    Rectal cancer Neg Hx    Stomach cancer Neg Hx    Social History   Socioeconomic History   Marital status: Widowed    Spouse name: Not on file   Number of children: Not on file   Years of education: Not on file   Highest education level: Not on file  Occupational History   Occupation: retired  Tobacco Use   Smoking status: Former    Years: 40.00    Types: Cigarettes    Quit date: 05/2017    Years since quitting: 4.0   Smokeless tobacco: Never   Tobacco comments:    10/04/2018 "stopped for awhile then started back"  Vaping Use   Vaping Use: Never used  Substance and Sexual Activity   Alcohol use: Not Currently    Comment: 2-3 times a year   Drug use: Yes    Types: Marijuana    Comment: when she can't sleep sometimes at night   Sexual activity: Not Currently  Other Topics Concern   Not on file  Social History Narrative   DIET: None      DO YOU DRINK/EAT THINGS WITH CAFFEINE: no      MARITAL STATUS: Widow      WHAT YEAR WERE YOU MARRIED: 1990      DO YOU  LIVE IN A HOUSE, APARTMENT, ASSISTED LIVING, CONDO TRAILER ETC.:  House      IS IT ONE OR MORE STORIES: One      HOW MANY PERSONS LIVE IN YOUR HOME: 3      DO YOU HAVE PETS IN YOUR HOME: no      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      DO YOU EXERCISE: sometimes      WHAT TYPE AND HOW OFTEN: once a week   Social Determinants of Health   Financial Resource Strain: Not on file  Food Insecurity: Not on file  Transportation Needs: Not on file  Physical Activity: Not on file  Stress: Not on file  Social Connections: Not on file    Tobacco Counseling Counseling given: Not Answered Tobacco comments: 10/04/2018 "stopped for awhile then started back"   Clinical Intake:  Pre-visit preparation completed: No  Pain : No/denies pain     BMI - recorded: 25.44 Nutritional Status: BMI 25 -29 Overweight Nutritional Risks: None Diabetes: No  How often do you need to have someone help you when you read instructions, pamphlets, or other written materials from your doctor or pharmacy?: 1 - Never What is the last grade level you completed in school?: 2 college  Diabetic?No   Interpreter Needed?: No      Activities of Daily Living In your present state of health, do you have any difficulty performing the following activities: 05/28/2021  Hearing? N  Vision? N  Difficulty concentrating or making decisions? N  Walking or climbing stairs? Y  Comment self propel on wheelchair  Dressing or bathing? N  Doing errands, shopping? Y  Comment daughter  Conservation officer, nature and eating ? Y  Comment son assist  Using the Toilet? N  In the past six months, have you accidently leaked urine? N  Do you have problems with loss of bowel control? N  Managing your Medications? N  Managing your Finances? N  Housekeeping or managing your Housekeeping? Y  Comment son assist  Some recent data might be hidden    Patient Care Team: Bricia Taher, Nelda Bucks, NP as PCP - General (Family Medicine) Nahser, Wonda Cheng, MD as PCP - Cardiology (Cardiology)  Indicate any recent Medical Services you may have received from other than [facility] providers in the past year (date may be approximate).     Assessment:   This is a routine wellness examination for Emili.  Hearing/Vision screen Hearing Screening - Comments:: No Hearing Concerns. Patient doesn't wear hearing aids.  Vision Screening - Comments:: No Vision Concerns. Patient wears reading glasses. Patient last eye exam was a couple years ago. Cant really recall.   Dietary issues and exercise activities discussed: Current Exercise Habits: Home exercise routine, Type of exercise: stretching, Time (Minutes): 30, Frequency (Times/Week): 2, Weekly Exercise (Minutes/Week): 60, Intensity: Moderate, Exercise limited by: Other - see comments (BKA)   Goals Addressed             This Visit's Progress    <enter goal here>   On track    Starting 09/30/16, I will maintain my current lifestyle.      Patient Stated   Not on track    I would like to be able to walk with prosthetic        Depression Screen PHQ 2/9 Scores 05/28/2021 09/30/2020 05/26/2020 12/26/2019 03/02/2018 11/25/2016 09/30/2016  PHQ - 2 Score 0 0 0 0 0 0 0  PHQ- 9 Score - - - - - 0 -    Fall Risk Fall Risk  05/28/2021 04/05/2021 09/30/2020 05/26/2020 03/13/2020  Falls in the past year? 0 0 0 0 0  Number falls in past yr: 0 0 0 0 0  Injury with Fall? 0 0 - 0 0  Risk for fall due to : No Fall Risks No Fall Risks - - -  Follow up Falls evaluation completed Falls evaluation completed - - -    FALL RISK PREVENTION PERTAINING TO THE HOME:  Any stairs in or around the home? No  If so, are there any without handrails? No  Home free of loose throw rugs in walkways, pet beds, electrical cords, etc? No  Adequate lighting in  your home to reduce risk of falls? Yes   ASSISTIVE DEVICES UTILIZED TO PREVENT FALLS:  Life alert? No  Use of a cane, walker or w/c? Yes  Grab bars in the bathroom? Yes  Shower chair  or bench in shower? Yes  Elevated toilet seat or a handicapped toilet? Yes   TIMED UP AND GO:  Was the test performed? No .  Length of time to ambulate 10 feet: N/A  sec.   Gait unsteady with use of assistive device, provider informed and education provided.   Cognitive Function: MMSE - Mini Mental State Exam 03/02/2018 09/30/2016  Orientation to time 4 5  Orientation to Place 4 4  Registration 3 3  Attention/ Calculation 5 5  Recall 2 3  Language- name 2 objects 2 2  Language- repeat 1 1  Language- follow 3 step command 3 3  Language- read & follow direction 1 1  Write a sentence 1 1  Copy design 1 1  Total score 27 29     6CIT Screen 05/28/2021 05/26/2020  What Year? 0 points 0 points  What month? 0 points 0 points  What time? 0 points 0 points  Count back from 20 0 points 0 points  Months in reverse 0 points 0 points  Repeat phrase 0 points 0 points  Total Score 0 0    Immunizations Immunization History  Administered Date(s) Administered   Fluad Quad(high Dose 65+) 09/03/2019, 09/30/2020   Influenza, High Dose Seasonal PF 11/02/2018   Influenza,inj,Quad PF,6+ Mos 09/30/2016, 07/19/2017   PFIZER(Purple Top)SARS-COV-2 Vaccination 04/02/2020, 04/23/2020   Pneumococcal Conjugate-13 03/13/2020   Pneumococcal Polysaccharide-23 04/04/2017    TDAP status: Due, Education has been provided regarding the importance of this vaccine. Advised may receive this vaccine at local pharmacy or Health Dept. Aware to provide a copy of the vaccination record if obtained from local pharmacy or Health Dept. Verbalized acceptance and understanding.  Flu Vaccine status: Due, Education has been provided regarding the importance of this vaccine. Advised may receive this vaccine at local pharmacy or Health Dept. Aware to provide a copy of the vaccination record if obtained from local pharmacy or Health Dept. Verbalized acceptance and understanding.  Pneumococcal vaccine status: Due, Education has  been provided regarding the importance of this vaccine. Advised may receive this vaccine at local pharmacy or Health Dept. Aware to provide a copy of the vaccination record if obtained from local pharmacy or Health Dept. Verbalized acceptance and understanding.  Covid-19 vaccine status: Information provided on how to obtain vaccines.   Qualifies for Shingles Vaccine? Yes   Zostavax completed No   Shingrix Completed?: No.    Education has been provided regarding the importance of this vaccine. Patient has been advised to call insurance company to determine out of pocket expense if they have not yet received this vaccine. Advised may also receive vaccine at local pharmacy or Health Dept. Verbalized acceptance and understanding.  Screening Tests Health Maintenance  Topic Date Due   Zoster Vaccines- Shingrix (1 of 2) Never done   MAMMOGRAM  11/02/2019   COVID-19 Vaccine (3 - Booster for Pfizer series) 09/23/2020   INFLUENZA VACCINE  04/19/2021   COLONOSCOPY (Pts 45-62yr Insurance coverage will need to be confirmed)  09/19/2028 (Originally 12/20/1996)   TETANUS/TDAP  09/19/2028 (Originally 12/21/1970)   DEXA SCAN  Completed   Hepatitis C Screening  Completed   PNA vac Low Risk Adult  Completed   HPV VACCINES  Aged Out    Health Maintenance  Health Maintenance Due  Topic Date Due   Zoster Vaccines- Shingrix (1 of 2) Never done   MAMMOGRAM  11/02/2019   COVID-19 Vaccine (3 - Booster for Pfizer series) 09/23/2020   INFLUENZA VACCINE  04/19/2021    Colorectal cancer screening: Referral to GI placed 05/28/2021 for Cologuard . Pt aware the office will call re: appt.  Mammogram status: Ordered 04/05/2021 . Pt provided with contact info and advised to call to schedule appt.   Bone Density status: Ordered 06/04/2019. Pt provided with contact info and advised to call to schedule appt.  Lung Cancer Screening: (Low Dose CT Chest recommended if Age 60-80 years, 30 pack-year currently smoking OR have  quit w/in 15years.) does qualify.   Lung Cancer Screening Referral: declined   Additional Screening:  Hepatitis C Screening: does qualify; Completed yes   Vision Screening: Recommended annual ophthalmology exams for early detection of glaucoma and other disorders of the eye. Is the patient up to date with their annual eye exam?  No  Who is the provider or what is the name of the office in which the patient attends annual eye exams? Could not recall provider's name. If pt is not established with a provider, would they like to be referred to a provider to establish care? Yes .   Dental Screening: Recommended annual dental exams for proper oral hygiene  Community Resource Referral / Chronic Care Management: CRR required this visit?  No   CCM required this visit?  No      Plan:     I have personally reviewed and noted the following in the patient's chart:   Medical and social history Use of alcohol, tobacco or illicit drugs  Current medications and supplements including opioid prescriptions.  Functional ability and status Nutritional status Physical activity Advanced directives List of other physicians Hospitalizations, surgeries, and ER visits in previous 12 months Vitals Screenings to include cognitive, depression, and falls Referrals and appointments  In addition, I have reviewed and discussed with patient certain preventive protocols, quality metrics, and best practice recommendations. A written personalized care plan for preventive services as well as general preventive health recommendations were provided to patient.     Sandrea Hughs, NP   05/28/2021   Nurse Notes: Advised to get COVID-19 booster vaccine and shingrix at her pharmacy.

## 2021-05-28 NOTE — Patient Instructions (Signed)
Wendy Townsend , Thank you for taking time to come for your Medicare Wellness Visit. I appreciate your ongoing commitment to your health goals. Please review the following plan we discussed and let me know if I can assist you in the future.   Screening recommendations/referrals: Colonoscopy: Declined but request cologuard  Mammogram: Ordered  Bone Density Ordered today imaging center will call you to make appointment  Recommended yearly ophthalmology/optometry visit for glaucoma screening and checkup Recommended yearly dental visit for hygiene and checkup  Vaccinations: Influenza vaccine: Due  Pneumococcal vaccine : 2 nd Pneumococcal 23 due  Tdap vaccine: Please get Teteanus shot at your pharmacy script send today  Shingles vaccine Please get shingrix vaccine at your pharmacy     Advanced directives: No  Conditions/risks identified: Hypertension,dyslipidemia,smoker and advance age female > 37 yrs   Next appointment: 1 year    Preventive Care 20 Years and Older, Female Preventive care refers to lifestyle choices and visits with your health care provider that can promote health and wellness. What does preventive care include? A yearly physical exam. This is also called an annual well check. Dental exams once or twice a year. Routine eye exams. Ask your health care provider how often you should have your eyes checked. Personal lifestyle choices, including: Daily care of your teeth and gums. Regular physical activity. Eating a healthy diet. Avoiding tobacco and drug use. Limiting alcohol use. Practicing safe sex. Taking low-dose aspirin every day. Taking vitamin and mineral supplements as recommended by your health care provider. What happens during an annual well check? The services and screenings done by your health care provider during your annual well check will depend on your age, overall health, lifestyle risk factors, and family history of disease. Counseling  Your health  care provider may ask you questions about your: Alcohol use. Tobacco use. Drug use. Emotional well-being. Home and relationship well-being. Sexual activity. Eating habits. History of falls. Memory and ability to understand (cognition). Work and work Statistician. Reproductive health. Screening  You may have the following tests or measurements: Height, weight, and BMI. Blood pressure. Lipid and cholesterol levels. These may be checked every 5 years, or more frequently if you are over 3 years old. Skin check. Lung cancer screening. You may have this screening every year starting at age 52 if you have a 30-pack-year history of smoking and currently smoke or have quit within the past 15 years. Fecal occult blood test (FOBT) of the stool. You may have this test every year starting at age 58. Flexible sigmoidoscopy or colonoscopy. You may have a sigmoidoscopy every 5 years or a colonoscopy every 10 years starting at age 68. Hepatitis C blood test. Hepatitis B blood test. Sexually transmitted disease (STD) testing. Diabetes screening. This is done by checking your blood sugar (glucose) after you have not eaten for a while (fasting). You may have this done every 1-3 years. Bone density scan. This is done to screen for osteoporosis. You may have this done starting at age 17. Mammogram. This may be done every 1-2 years. Talk to your health care provider about how often you should have regular mammograms. Talk with your health care provider about your test results, treatment options, and if necessary, the need for more tests. Vaccines  Your health care provider may recommend certain vaccines, such as: Influenza vaccine. This is recommended every year. Tetanus, diphtheria, and acellular pertussis (Tdap, Td) vaccine. You may need a Td booster every 10 years. Zoster vaccine. You may need this  after age 71. Pneumococcal 13-valent conjugate (PCV13) vaccine. One dose is recommended after age  54. Pneumococcal polysaccharide (PPSV23) vaccine. One dose is recommended after age 44. Talk to your health care provider about which screenings and vaccines you need and how often you need them. This information is not intended to replace advice given to you by your health care provider. Make sure you discuss any questions you have with your health care provider. Document Released: 10/02/2015 Document Revised: 05/25/2016 Document Reviewed: 07/07/2015 Elsevier Interactive Patient Education  2017 [place] Prevention in the Home Falls can cause injuries. They can happen to people of all ages. There are many things you can do to make your home safe and to help prevent falls. What can I do on the outside of my home? Regularly fix the edges of walkways and driveways and fix any cracks. Remove anything that might make you trip as you walk through a door, such as a raised step or threshold. Trim any bushes or trees on the path to your home. Use bright outdoor lighting. Clear any walking paths of anything that might make someone trip, such as rocks or tools. Regularly check to see if handrails are loose or broken. Make sure that both sides of any steps have handrails. Any raised decks and porches should have guardrails on the edges. Have any leaves, snow, or ice cleared regularly. Use sand or salt on walking paths during winter. Clean up any spills in your garage right away. This includes oil or grease spills. What can I do in the bathroom? Use night lights. Install grab bars by the toilet and in the tub and shower. Do not use towel bars as grab bars. Use non-skid mats or decals in the tub or shower. If you need to sit down in the shower, use a plastic, non-slip stool. Keep the floor dry. Clean up any water that spills on the floor as soon as it happens. Remove soap buildup in the tub or shower regularly. Attach bath mats securely with double-sided non-slip rug tape. Do not have throw  rugs and other things on the floor that can make you trip. What can I do in the bedroom? Use night lights. Make sure that you have a light by your bed that is easy to reach. Do not use any sheets or blankets that are too big for your bed. They should not hang down onto the floor. Have a firm chair that has side arms. You can use this for support while you get dressed. Do not have throw rugs and other things on the floor that can make you trip. What can I do in the kitchen? Clean up any spills right away. Avoid walking on wet floors. Keep items that you use a lot in easy-to-reach places. If you need to reach something above you, use a strong step stool that has a grab bar. Keep electrical cords out of the way. Do not use floor polish or wax that makes floors slippery. If you must use wax, use non-skid floor wax. Do not have throw rugs and other things on the floor that can make you trip. What can I do with my stairs? Do not leave any items on the stairs. Make sure that there are handrails on both sides of the stairs and use them. Fix handrails that are broken or loose. Make sure that handrails are as long as the stairways. Check any carpeting to make sure that it is firmly attached to the  stairs. Fix any carpet that is loose or worn. Avoid having throw rugs at the top or bottom of the stairs. If you do have throw rugs, attach them to the floor with carpet tape. Make sure that you have a light switch at the top of the stairs and the bottom of the stairs. If you do not have them, ask someone to add them for you. What else can I do to help prevent falls? Wear shoes that: Do not have high heels. Have rubber bottoms. Are comfortable and fit you well. Are closed at the toe. Do not wear sandals. If you use a stepladder: Make sure that it is fully opened. Do not climb a closed stepladder. Make sure that both sides of the stepladder are locked into place. Ask someone to hold it for you, if  possible. Clearly mark and make sure that you can see: Any grab bars or handrails. First and last steps. Where the edge of each step is. Use tools that help you move around (mobility aids) if they are needed. These include: Canes. Walkers. Scooters. Crutches. Turn on the lights when you go into a dark area. Replace any light bulbs as soon as they burn out. Set up your furniture so you have a clear path. Avoid moving your furniture around. If any of your floors are uneven, fix them. If there are any pets around you, be aware of where they are. Review your medicines with your doctor. Some medicines can make you feel dizzy. This can increase your chance of falling. Ask your doctor what other things that you can do to help prevent falls. This information is not intended to replace advice given to you by your health care provider. Make sure you discuss any questions you have with your health care provider. Document Released: 07/02/2009 Document Revised: 02/11/2016 Document Reviewed: 10/10/2014 Elsevier Interactive Patient Education  2017 [employer].

## 2021-06-27 ENCOUNTER — Other Ambulatory Visit: Payer: Self-pay | Admitting: Family

## 2021-06-27 DIAGNOSIS — I10 Essential (primary) hypertension: Secondary | ICD-10-CM

## 2021-07-07 ENCOUNTER — Other Ambulatory Visit: Payer: Self-pay | Admitting: Family

## 2021-09-10 ENCOUNTER — Other Ambulatory Visit: Payer: Self-pay

## 2021-09-10 DIAGNOSIS — E785 Hyperlipidemia, unspecified: Secondary | ICD-10-CM

## 2021-09-10 DIAGNOSIS — I129 Hypertensive chronic kidney disease with stage 1 through stage 4 chronic kidney disease, or unspecified chronic kidney disease: Secondary | ICD-10-CM

## 2021-09-10 DIAGNOSIS — I10 Essential (primary) hypertension: Secondary | ICD-10-CM

## 2021-09-10 DIAGNOSIS — E782 Mixed hyperlipidemia: Secondary | ICD-10-CM

## 2021-09-10 DIAGNOSIS — R7303 Prediabetes: Secondary | ICD-10-CM

## 2021-09-22 ENCOUNTER — Other Ambulatory Visit: Payer: Medicare Other

## 2021-09-22 ENCOUNTER — Other Ambulatory Visit: Payer: Self-pay

## 2021-09-22 DIAGNOSIS — N183 Chronic kidney disease, stage 3 unspecified: Secondary | ICD-10-CM

## 2021-09-22 DIAGNOSIS — I10 Essential (primary) hypertension: Secondary | ICD-10-CM

## 2021-09-22 DIAGNOSIS — E782 Mixed hyperlipidemia: Secondary | ICD-10-CM | POA: Diagnosis not present

## 2021-09-22 DIAGNOSIS — I129 Hypertensive chronic kidney disease with stage 1 through stage 4 chronic kidney disease, or unspecified chronic kidney disease: Secondary | ICD-10-CM | POA: Diagnosis not present

## 2021-09-22 DIAGNOSIS — R7303 Prediabetes: Secondary | ICD-10-CM

## 2021-09-22 DIAGNOSIS — E785 Hyperlipidemia, unspecified: Secondary | ICD-10-CM | POA: Diagnosis not present

## 2021-09-23 ENCOUNTER — Other Ambulatory Visit: Payer: Self-pay | Admitting: Family

## 2021-09-23 DIAGNOSIS — I129 Hypertensive chronic kidney disease with stage 1 through stage 4 chronic kidney disease, or unspecified chronic kidney disease: Secondary | ICD-10-CM

## 2021-09-23 DIAGNOSIS — N183 Chronic kidney disease, stage 3 unspecified: Secondary | ICD-10-CM

## 2021-09-23 LAB — HEMOGLOBIN A1C
Hgb A1c MFr Bld: 5.8 % of total Hgb — ABNORMAL HIGH (ref <5.7)
Mean Plasma Glucose: 120 mg/dL
eAG (mmol/L): 6.6 mmol/L

## 2021-09-23 LAB — COMPLETE METABOLIC PANEL WITH GFR
AG Ratio: 1.4 (calc) (ref 1.0–2.5)
ALT: 8 U/L (ref 6–29)
AST: 13 U/L (ref 10–35)
Albumin: 4.5 g/dL (ref 3.6–5.1)
Alkaline phosphatase (APISO): 128 U/L (ref 37–153)
BUN/Creatinine Ratio: 14 (calc) (ref 6–22)
BUN: 16 mg/dL (ref 7–25)
CO2: 26 mmol/L (ref 20–32)
Calcium: 9.6 mg/dL (ref 8.6–10.4)
Chloride: 104 mmol/L (ref 98–110)
Creat: 1.16 mg/dL — ABNORMAL HIGH (ref 0.50–1.05)
Globulin: 3.3 g/dL (calc) (ref 1.9–3.7)
Glucose, Bld: 107 mg/dL — ABNORMAL HIGH (ref 65–99)
Potassium: 3.5 mmol/L (ref 3.5–5.3)
Sodium: 142 mmol/L (ref 135–146)
Total Bilirubin: 0.6 mg/dL (ref 0.2–1.2)
Total Protein: 7.8 g/dL (ref 6.1–8.1)
eGFR: 51 mL/min/{1.73_m2} — ABNORMAL LOW (ref >=60)

## 2021-09-23 LAB — LIPID PANEL
Cholesterol: 131 mg/dL (ref <200)
HDL: 45 mg/dL — ABNORMAL LOW (ref >=50)
LDL Cholesterol (Calc): 66 mg/dL (calc)
Non-HDL Cholesterol (Calc): 86 mg/dL (calc) (ref <130)
Total CHOL/HDL Ratio: 2.9 (calc) (ref <5.0)
Triglycerides: 122 mg/dL (ref <150)

## 2021-09-23 LAB — CBC WITH DIFFERENTIAL/PLATELET
Absolute Monocytes: 748 cells/uL (ref 200–950)
Basophils Absolute: 48 cells/uL (ref 0–200)
Basophils Relative: 0.7 %
Eosinophils Absolute: 197 cells/uL (ref 15–500)
Eosinophils Relative: 2.9 %
HCT: 42.2 % (ref 35.0–45.0)
Hemoglobin: 14.4 g/dL (ref 11.7–15.5)
Lymphs Abs: 2040 cells/uL (ref 850–3900)
MCH: 31.9 pg (ref 27.0–33.0)
MCHC: 34.1 g/dL (ref 32.0–36.0)
MCV: 93.4 fL (ref 80.0–100.0)
MPV: 10.5 fL (ref 7.5–12.5)
Monocytes Relative: 11 %
Neutro Abs: 3767 cells/uL (ref 1500–7800)
Neutrophils Relative %: 55.4 %
Platelets: 233 10*3/uL (ref 140–400)
RBC: 4.52 10*6/uL (ref 3.80–5.10)
RDW: 13.2 % (ref 11.0–15.0)
Total Lymphocyte: 30 %
WBC: 6.8 10*3/uL (ref 3.8–10.8)

## 2021-09-23 LAB — TSH: TSH: 2.47 mIU/L (ref 0.40–4.50)

## 2021-09-27 ENCOUNTER — Ambulatory Visit: Payer: Medicare Other | Admitting: Family

## 2021-10-06 ENCOUNTER — Ambulatory Visit (INDEPENDENT_AMBULATORY_CARE_PROVIDER_SITE_OTHER): Payer: Medicare Other | Admitting: Family

## 2021-10-06 ENCOUNTER — Encounter: Payer: Self-pay | Admitting: Family

## 2021-10-06 ENCOUNTER — Other Ambulatory Visit: Payer: Self-pay

## 2021-10-06 VITALS — BP 158/110 | HR 73 | Temp 97.1°F | Resp 16 | Ht 64.0 in

## 2021-10-06 DIAGNOSIS — H6123 Impacted cerumen, bilateral: Secondary | ICD-10-CM

## 2021-10-06 DIAGNOSIS — N183 Chronic kidney disease, stage 3 unspecified: Secondary | ICD-10-CM

## 2021-10-06 DIAGNOSIS — E785 Hyperlipidemia, unspecified: Secondary | ICD-10-CM

## 2021-10-06 DIAGNOSIS — F17200 Nicotine dependence, unspecified, uncomplicated: Secondary | ICD-10-CM

## 2021-10-06 DIAGNOSIS — I739 Peripheral vascular disease, unspecified: Secondary | ICD-10-CM

## 2021-10-06 DIAGNOSIS — Z89612 Acquired absence of left leg above knee: Secondary | ICD-10-CM

## 2021-10-06 DIAGNOSIS — I129 Hypertensive chronic kidney disease with stage 1 through stage 4 chronic kidney disease, or unspecified chronic kidney disease: Secondary | ICD-10-CM

## 2021-10-06 DIAGNOSIS — Z23 Encounter for immunization: Secondary | ICD-10-CM

## 2021-10-06 DIAGNOSIS — R7303 Prediabetes: Secondary | ICD-10-CM | POA: Diagnosis not present

## 2021-10-06 MED ORDER — AMLODIPINE BESYLATE 10 MG PO TABS: 10.0000 mg | ORAL_TABLET | Freq: Every day | ORAL | 3 refills | Status: DC

## 2021-10-06 NOTE — Progress Notes (Signed)
Provider: Marlowe Sax FNP-C   Ngetich, Nelda Bucks, NP  Patient Care Team: Ngetich, Nelda Bucks, NP as PCP - General (Family Medicine) Nahser, Wonda Cheng, MD as PCP - Cardiology (Cardiology)  Extended Emergency Contact Information Primary Emergency Contact: Claudina Lick, [place] [address] Johnnette Litter of [place] Phone: [phone] Relation: Niece Secondary Emergency Contact: [place] Phone: [phone] Relation: Sister  Code Status:  Full Code  Goals of care: Advanced Directive information Advanced Directives 10/06/2021  Does Patient Have a Medical Advance Directive? No  Type of Advance Directive -  Does patient want to make changes to medical advance directive? -  Copy of [place] in Chart? -  Would patient like information on creating a medical advance directive? No - Patient declined     Chief Complaint  Patient presents with   Medical Management of Chronic Issues    6 month follow up   Health Maintenance    Discuss the need for Mammogram.    Immunizations    Discuss the need for Shingrix vaccine, Influenza vaccine, and Covid Booster.    HPI:  Pt is a 71 y.o. female seen today for 6 months follow up for medical management of chronic diseases.she denies any acute issues.  Recent lab work reviewed and discussed with patient.Glucose 107,Hgb A 1 C 5.8  States has been sitting most of the time on the wheelchair.does not wear her left prosthetic most of the time.states thigh is too large most of her pants don't fit.  Denies any pain or ulcer on left AKA  No fall episode.thinks has gained weight.unable to weigh this visit.since she is on her wheelchair and did not wear her prosthetic.    Smokes every now and then at least one a day.   She is due for shingles ,Influenza and COVID-19 booster vaccine. Also due for mammogram but declines.    Past Medical History:  Diagnosis Date   Clotting disorder ([place])    Hyperlipidemia     Hypertension    Peripheral vascular disease ([place])    vein surgery, left leg   Stroke ([place]) 2002   ongoing mild loss of feeling in left side, had to learn to write right-handed   Substance abuse ([place])    Tobacco abuse    Past Surgical History:  Procedure Laterality Date   AMPUTATION Left 07/01/2019   Procedure: AMPUTATION ABOVE KNEE, left;  Surgeon: Rosetta Posner, MD;  Location: [facility];  Service: Vascular;  Laterality: Left;   AORTA - BILATERAL FEMORAL ARTERY BYPASS GRAFT N/A 07/14/2016   Procedure: AORTOBIFEMORAL BYPASS GRAFT;  Surgeon: Waynetta Sandy, MD;  Location: [facility];  Service: Vascular;  Laterality: N/A;   AORTA - BILATERAL FEMORAL ARTERY BYPASS GRAFT Left 10/03/2018   Procedure: THROMBECTOMY OF LEFT LEG, LEFT FEMORAL-POPLITEAL BYPASS GRAFT, RIGHT TO LEFT FEMORAL-FEMORAL BYPASS GRAFT;  Surgeon: Rosetta Posner, MD;  Location: [facility];  Service: Vascular;  Laterality: Left;   CARDIAC CATHETERIZATION N/A 07/11/2016   Procedure: Left Heart Cath and Coronary Angiography;  Surgeon: Nelva Bush, MD;  Location: [facility];  Service: Cardiovascular;  Laterality: N/A;   FEMORAL-POPLITEAL BYPASS GRAFT Left 07/14/2016   Procedure: REDO BYPASS GRAFT FEMORAL-POPLITEAL ARTERY;  Surgeon: Waynetta Sandy, MD;  Location: [facility];  Service: Vascular;  Laterality: Left;   LOWER EXTREMITY ANGIOGRAPHY N/A 06/26/2019   Procedure: LOWER EXTREMITY ANGIOGRAPHY;  Surgeon: Marty Heck, MD;  Location: [facility];  Service: Cardiovascular;  Laterality: N/A;   LOWER EXTREMITY INTERVENTION Left 06/27/2019   Procedure: LOWER EXTREMITY INTERVENTION;  Surgeon: Marty Heck, MD;  Location: [facility];  Service: Cardiovascular;  Laterality: Left;   PERIPHERAL VASCULAR CATHETERIZATION N/A 07/08/2016   Procedure: Abdominal Aortogram w/ Bilateral Lower Extremity Runoff;  Surgeon: Elam Dutch, MD;  Location: [facility];  Service: Cardiovascular;   Laterality: N/A;   PERIPHERAL VASCULAR THROMBECTOMY Left 06/27/2019   Procedure: LYSIS RECHECK;  Surgeon: Marty Heck, MD;  Location: [facility];  Service: Cardiovascular;  Laterality: Left;   VEIN SURGERY Left     Allergies  Allergen Reactions   Penicillins Swelling    Has patient had a PCN reaction causing immediate rash, facial/tongue/throat swelling, SOB or lightheadedness with hypotension: YES Has patient had a PCN reaction causing severe rash involving mucus membranes or skin necrosis: NO Has patient had a PCN reaction that required hospitalization NO Has patient had a PCN reaction occurring within the last 10 years: NO If all of the above answers are "NO", then may proceed with Cephalosporin use.    Allergies as of 10/06/2021       Reactions   Penicillins Swelling   Has patient had a PCN reaction causing immediate rash, facial/tongue/throat swelling, SOB or lightheadedness with hypotension: YES Has patient had a PCN reaction causing severe rash involving mucus membranes or skin necrosis: NO Has patient had a PCN reaction that required hospitalization NO Has patient had a PCN reaction occurring within the last 10 years: NO If all of the above answers are "NO", then may proceed with Cephalosporin use.        Medication List        Accurate as of October 06, 2021  9:59 AM. If you have any questions, ask your nurse or doctor.          amLODipine 5 MG tablet Commonly known as: NORVASC TAKE 1 TABLET (5 MG TOTAL) BY MOUTH DAILY. DX I10   Aspirin Low Dose 81 MG EC tablet Generic drug: aspirin TAKE 1 TABLET (81 MG TOTAL) BY MOUTH DAILY. SWALLOW WHOLE.   atorvastatin 80 MG tablet Commonly known as: LIPITOR TAKE 1 TABLET BY MOUTH EVERY DAY   carvedilol 3.125 MG tablet Commonly known as: COREG TAKE 1 TABLET (3.125 MG TOTAL) BY MOUTH 2 (TWO) TIMES DAILY WITH A MEAL.   mineral oil-hydrophilic petrolatum ointment Apply topically 2 (two) times daily.  Apply to lower extremities   Oyster Shell Calcium w/D 500-5 MG-MCG Tabs TAKE 1 TABLET BY MOUTH TWICE A DAY        Review of Systems  Constitutional:  Negative for appetite change, chills, fatigue, fever and unexpected weight change.  HENT:  Negative for congestion, dental problem, ear discharge, ear pain, facial swelling, hearing loss, nosebleeds, postnasal drip, rhinorrhea, sinus pressure, sinus pain, sneezing, sore throat, tinnitus and trouble swallowing.   Eyes:  Negative for pain, discharge, redness, itching and visual disturbance.  Respiratory:  Negative for cough, chest tightness, shortness of breath and wheezing.   Cardiovascular:  Negative for chest pain, palpitations and leg swelling.  Gastrointestinal:  Negative for abdominal distention, abdominal pain, blood in stool, constipation, diarrhea, nausea and vomiting.  Endocrine: Negative for cold intolerance, heat intolerance, polydipsia, polyphagia and polyuria.  Genitourinary:  Negative for difficulty urinating, dysuria, flank pain, frequency and urgency.  Musculoskeletal:  Positive for gait problem. Negative for arthralgias, back pain, joint swelling, myalgias, neck pain and  neck stiffness.       Left AKA   Skin:  Negative for color change, pallor, rash and wound.  Neurological:  Negative for dizziness, syncope, speech difficulty, weakness, light-headedness, numbness and headaches.  Hematological:  Does not bruise/bleed easily.  Psychiatric/Behavioral:  Negative for agitation, behavioral problems, confusion, hallucinations, self-injury, sleep disturbance and suicidal ideas. The patient is not nervous/anxious.    Immunization History  Administered Date(s) Administered   Fluad Quad(high Dose 65+) 09/03/2019, 09/30/2020   Influenza, High Dose Seasonal PF 11/02/2018   Influenza,inj,Quad PF,6+ Mos 09/30/2016, 07/19/2017   PFIZER(Purple Top)SARS-COV-2 Vaccination 04/02/2020, 04/23/2020   Pneumococcal Conjugate-13 03/13/2020    Pneumococcal Polysaccharide-23 04/04/2017   Pertinent  Health Maintenance Due  Topic Date Due   MAMMOGRAM  11/02/2019   INFLUENZA VACCINE  04/19/2021   COLONOSCOPY (Pts 45-54yr Insurance coverage will need to be confirmed)  09/19/2028 (Originally 12/20/1996)   DEXA SCAN  Completed   Fall Risk 05/26/2020 09/30/2020 04/05/2021 05/28/2021 10/06/2021  Falls in the past year? 0 0 0 0 0  Was there an injury with Fall? 0 - 0 0 0  Fall Risk Category Calculator 0 - 0 0 0  Fall Risk Category Low - Low Low Low  Patient Fall Risk Level Low fall risk - Low fall risk Low fall risk Low fall risk  Patient at Risk for Falls Due to - - No Fall Risks No Fall Risks No Fall Risks  Fall risk Follow up - - Falls evaluation completed Falls evaluation completed Falls evaluation completed   Functional Status Survey:    Vitals:   10/06/21 0954  BP: (!) 158/110  Pulse: 73  Resp: 16  Temp: (!) 97.1 F (36.2 C)  SpO2: 97%  Height: 5' 4" (1.626 m)   Body mass index is 25.44 kg/m. Physical Exam Vitals reviewed.  Constitutional:      General: She is not in acute distress.    Appearance: Normal appearance. She is normal weight. She is not ill-appearing or diaphoretic.  HENT:     Head: Normocephalic.     Right Ear: Tympanic membrane, ear canal and external ear normal. There is no impacted cerumen.     Left Ear: Tympanic membrane, ear canal and external ear normal. There is no impacted cerumen.     Nose: Nose normal. No congestion or rhinorrhea.     Mouth/Throat:     Mouth: Mucous membranes are moist.     Pharynx: Oropharynx is clear. No oropharyngeal exudate or posterior oropharyngeal erythema.  Eyes:     General: No scleral icterus.       Right eye: No discharge.        Left eye: No discharge.     Extraocular Movements: Extraocular movements intact.     Conjunctiva/sclera: Conjunctivae normal.     Pupils: Pupils are equal, round, and reactive to light.  Neck:     Vascular: No carotid bruit.   Cardiovascular:     Rate and Rhythm: Normal rate and regular rhythm.     Heart sounds: Normal heart sounds. No murmur heard.   No friction rub. No gallop.     Comments: Left AKA  Pulmonary:     Effort: Pulmonary effort is normal. No respiratory distress.     Breath sounds: Normal breath sounds. No wheezing, rhonchi or rales.  Chest:     Chest wall: No tenderness.  Abdominal:     General: Bowel sounds are normal. There is no distension.     Palpations: Abdomen is  soft. There is no mass.     Tenderness: There is no abdominal tenderness. There is no right CVA tenderness, left CVA tenderness, guarding or rebound.  Musculoskeletal:        General: No swelling or tenderness. Normal range of motion.     Cervical back: Normal range of motion. No rigidity or tenderness.     Right lower leg: No edema.     Comments: Unsteady gait self propels on wheelchair      Left Lower Extremity: Left leg is amputated above knee.  Lymphadenopathy:     Cervical: No cervical adenopathy.  Skin:    General: Skin is warm and dry.     Coloration: Skin is not pale.     Findings: No bruising, erythema, lesion or rash.  Neurological:     Mental Status: She is alert and oriented to person, place, and time.     Cranial Nerves: No cranial nerve deficit.     Sensory: No sensory deficit.     Motor: No weakness.     Coordination: Coordination normal.     Gait: Gait normal.  Psychiatric:        Mood and Affect: Mood normal.        Speech: Speech normal.        Behavior: Behavior normal.        Thought Content: Thought content normal.        Judgment: Judgment normal.    Labs reviewed: Recent Labs    04/05/21 1008 09/22/21 0958  NA 140 142  K 3.3* 3.5  CL 106 104  CO2 25 26  GLUCOSE 97 107*  BUN 20 16  CREATININE 1.11* 1.16*  CALCIUM 10.1 9.6   Recent Labs    04/05/21 1008 09/22/21 0958  AST 18 13  ALT 13 8  BILITOT 0.6 0.6  PROT 7.8 7.8   Recent Labs    04/05/21 1008 09/22/21 0958  WBC  5.7 6.8  NEUTROABS 2,947 3,767  HGB 14.0 14.4  HCT 40.4 42.2  MCV 93.3 93.4  PLT 212 233   Lab Results  Component Value Date   TSH 2.47 09/22/2021   Lab Results  Component Value Date   HGBA1C 5.8 (H) 09/22/2021   Lab Results  Component Value Date   CHOL 131 09/22/2021   HDL 45 (L) 09/22/2021   LDLCALC 66 09/22/2021   TRIG 122 09/22/2021   CHOLHDL 2.9 09/22/2021    Significant Diagnostic Results in last 30 days:  No results found.  Assessment/Plan 1. Need for influenza vaccination Afebrile - Flut shot administered by CMA no acute reaction reported.  - Flu Vaccine QUAD High Dose(Fluad)  2. Benign hypertension with CKD (chronic kidney disease) stage III (HCC) B/p elevated today has taken her blood pressure medication. - will increase amlodipine from 5 mg tablet to 10 mg tablet daily  - continue on Coreg  -  Advised to check Blood pressure at home and record on log provided and notify provider if B/p > 140/90  - CBC with Differential/Platelet; Future - CMP with eGFR([facility]); Future - amLODipine (NORVASC) 10 MG tablet; Take 1 tablet (10 mg total) by mouth daily. DX I10  Dispense: 30 tablet; Refill: 3 - follow up in 2 weeks to recheck B/p   3. Hyperlipidemia LDL goal <70 LDL at goal  Continue on Atorvastatin  - dietary modification advised but states should be able to eat anything she wants. - Lipid panel; Future  4. Prediabetes Lab Results  Component  Value Date   HGBA1C 5.8 (H) 09/22/2021  Dietary modification and chair exercises advised  - Hemoglobin A1c; Future  5. PAD (peripheral artery disease) (HCC) No ulceration  - continue on ASA,Atorvastatin   6. Tobacco use disorder Continues to smoke one cigarette per day.  Cessation advised   7. Status post above-knee amputation of left lower extremity ([place]) Rare wears her Prosthetic due to large size on her thigh does not fit her pants. - continue with fall and safety precaution.  8. Bilateral cerumen  impaction  - advised to Instill debrox 6.5 otic solution 5 drops into each ear twice daily x 4 days then follow up for ear lavage.May apply cotton ball at bedtime to prevent drainage to pillow. - will follow up in 2 weeks for bilateral ear lavage.   Family/ staff Communication: Reviewed plan of care with patient verbalized understanding   Labs/tests ordered:  - CBC with Differential/Platelet - CMP with eGFR([facility]) - Hgb A1C - Lipid panel  Next Appointment : 6 months for medical management of chronic issues.Fasting Labs prior to visit. 2 weeks for blood pressure check and bilateral ear lavage.    Sandrea Hughs, NP

## 2021-10-06 NOTE — Patient Instructions (Signed)
-    check Blood pressure at home and record on log provided and notify provider if B/p > 140/90   - Increase Amlodipine from 5 mg tablet to 10 mg tablet one by mouth daily

## 2021-10-20 ENCOUNTER — Ambulatory Visit: Payer: Medicare Other | Admitting: Family

## 2021-11-02 ENCOUNTER — Ambulatory Visit (INDEPENDENT_AMBULATORY_CARE_PROVIDER_SITE_OTHER): Payer: Medicare Other | Admitting: Family

## 2021-11-02 ENCOUNTER — Encounter: Payer: Self-pay | Admitting: Family

## 2021-11-02 ENCOUNTER — Other Ambulatory Visit: Payer: Self-pay

## 2021-11-02 VITALS — BP 134/80 | HR 70 | Temp 97.3°F | Resp 16 | Ht 64.0 in | Wt 158.8 lb

## 2021-11-02 DIAGNOSIS — H6123 Impacted cerumen, bilateral: Secondary | ICD-10-CM

## 2021-11-02 DIAGNOSIS — I10 Essential (primary) hypertension: Secondary | ICD-10-CM | POA: Diagnosis not present

## 2021-11-02 NOTE — Progress Notes (Signed)
Provider: Marlowe Sax FNP-C  Ivyana Locey, Nelda Bucks, NP  Patient Care Team: Riley Hallum, Nelda Bucks, NP as PCP - General (Family Medicine) Nahser, Wonda Cheng, MD as PCP - Cardiology (Cardiology)  Extended Emergency Contact Information Primary Emergency Contact: Claudina Lick, [place] [address] Johnnette Litter of [place] Phone: [phone] Relation: Niece Secondary Emergency Contact: [place] Phone: [phone] Relation: Sister  Code Status:  Full code  Goals of care: Advanced Directive information Advanced Directives 11/02/2021  Does Patient Have a Medical Advance Directive? No  Type of Advance Directive -  Does patient want to make changes to medical advance directive? -  Copy of [place] in Chart? -  Would patient like information on creating a medical advance directive? No - Patient declined     Chief Complaint  Patient presents with   Follow-up    2 week follow up on BP.   Procedure     Lavage Both Ears.    HPI:  Pt is a 71 y.o. female seen today for an acute visit for 2 weeks follow up for high blood pressure.she was here on 10/06/2021 b/p was 158/110.Her amlodipine was increased from 5 mg to 10 mg tablet daily.she was advised to check B/p at home and bring log to visit today.  Also here for bilateral ear lavage.she was advised to instil debrox 5 drops twice daily x 4 days then follow up today for ear lavage.Has used debrox as instructed.     Past Medical History:  Diagnosis Date   Clotting disorder ([place])    Hyperlipidemia    Hypertension    Peripheral vascular disease ([place])    vein surgery, left leg   Stroke ([place]) 2002   ongoing mild loss of feeling in left side, had to learn to write right-handed   Substance abuse ([place])    Tobacco abuse    Past Surgical History:  Procedure Laterality Date   AMPUTATION Left 07/01/2019   Procedure: AMPUTATION ABOVE KNEE, left;  Surgeon: Rosetta Posner, MD;  Location: [facility];  Service:  Vascular;  Laterality: Left;   AORTA - BILATERAL FEMORAL ARTERY BYPASS GRAFT N/A 07/14/2016   Procedure: AORTOBIFEMORAL BYPASS GRAFT;  Surgeon: Waynetta Sandy, MD;  Location: [facility];  Service: Vascular;  Laterality: N/A;   AORTA - BILATERAL FEMORAL ARTERY BYPASS GRAFT Left 10/03/2018   Procedure: THROMBECTOMY OF LEFT LEG, LEFT FEMORAL-POPLITEAL BYPASS GRAFT, RIGHT TO LEFT FEMORAL-FEMORAL BYPASS GRAFT;  Surgeon: Rosetta Posner, MD;  Location: [facility];  Service: Vascular;  Laterality: Left;   CARDIAC CATHETERIZATION N/A 07/11/2016   Procedure: Left Heart Cath and Coronary Angiography;  Surgeon: Nelva Bush, MD;  Location: [facility];  Service: Cardiovascular;  Laterality: N/A;   FEMORAL-POPLITEAL BYPASS GRAFT Left 07/14/2016   Procedure: REDO BYPASS GRAFT FEMORAL-POPLITEAL ARTERY;  Surgeon: Waynetta Sandy, MD;  Location: [facility];  Service: Vascular;  Laterality: Left;   LOWER EXTREMITY ANGIOGRAPHY N/A 06/26/2019   Procedure: LOWER EXTREMITY ANGIOGRAPHY;  Surgeon: Marty Heck, MD;  Location: [facility];  Service: Cardiovascular;  Laterality: N/A;   LOWER EXTREMITY INTERVENTION Left 06/27/2019   Procedure: LOWER EXTREMITY INTERVENTION;  Surgeon: Marty Heck, MD;  Location: [facility];  Service: Cardiovascular;  Laterality: Left;   PERIPHERAL VASCULAR CATHETERIZATION N/A 07/08/2016   Procedure: Abdominal Aortogram w/ Bilateral Lower Extremity Runoff;  Surgeon: Elam Dutch, MD;  Location: [facility];  Service: Cardiovascular;  Laterality: N/A;   PERIPHERAL VASCULAR THROMBECTOMY Left 06/27/2019   Procedure: LYSIS RECHECK;  Surgeon: Marty Heck, MD;  Location: [facility];  Service: Cardiovascular;  Laterality: Left;   VEIN SURGERY Left     Allergies  Allergen Reactions   Penicillins Swelling    Has patient had a PCN reaction causing immediate rash, facial/tongue/throat swelling, SOB or lightheadedness with  hypotension: YES Has patient had a PCN reaction causing severe rash involving mucus membranes or skin necrosis: NO Has patient had a PCN reaction that required hospitalization NO Has patient had a PCN reaction occurring within the last 10 years: NO If all of the above answers are "NO", then may proceed with Cephalosporin use.    Outpatient Encounter Medications as of 11/02/2021  Medication Sig   amLODipine (NORVASC) 10 MG tablet Take 1 tablet (10 mg total) by mouth daily. DX I10   ASPIRIN LOW DOSE 81 MG EC tablet TAKE 1 TABLET (81 MG TOTAL) BY MOUTH DAILY. SWALLOW WHOLE.   atorvastatin (LIPITOR) 80 MG tablet TAKE 1 TABLET BY MOUTH EVERY DAY   Calcium Carb-Cholecalciferol (OYSTER SHELL CALCIUM W/D) 500-5 MG-MCG TABS TAKE 1 TABLET BY MOUTH TWICE A DAY   carvedilol (COREG) 3.125 MG tablet TAKE 1 TABLET (3.125 MG TOTAL) BY MOUTH 2 (TWO) TIMES DAILY WITH A MEAL.   mineral oil-hydrophilic petrolatum (AQUAPHOR) ointment Apply topically 2 (two) times daily. Apply to lower extremities   No facility-administered encounter medications on file as of 11/02/2021.    Review of Systems  Constitutional:  Negative for appetite change, chills, fatigue, fever and unexpected weight change.  HENT:  Negative for congestion, dental problem, ear discharge, ear pain, facial swelling, hearing loss, nosebleeds, postnasal drip, rhinorrhea, sinus pressure, sinus pain, sneezing, sore throat and tinnitus.   Eyes:  Negative for pain, discharge, redness, itching and visual disturbance.  Respiratory:  Negative for cough, chest tightness, shortness of breath and wheezing.   Cardiovascular:  Negative for chest pain, palpitations and leg swelling.  Gastrointestinal:  Negative for abdominal distention, abdominal pain, nausea and vomiting.  Skin:  Negative for color change, pallor and rash.  Neurological:  Negative for dizziness, syncope, speech difficulty, weakness, light-headedness, numbness and headaches.   Immunization  History  Administered Date(s) Administered   Fluad Quad(high Dose 65+) 09/03/2019, 09/30/2020, 10/06/2021   Influenza, High Dose Seasonal PF 11/02/2018   Influenza,inj,Quad PF,6+ Mos 09/30/2016, 07/19/2017   PFIZER(Purple Top)SARS-COV-2 Vaccination 04/02/2020, 04/23/2020   Pneumococcal Conjugate-13 03/13/2020   Pneumococcal Polysaccharide-23 04/04/2017   Pertinent  Health Maintenance Due  Topic Date Due   MAMMOGRAM  11/02/2019   COLONOSCOPY (Pts 45-37yrs Insurance coverage will need to be confirmed)  09/19/2028 (Originally 12/20/1996)   INFLUENZA VACCINE  Completed   DEXA SCAN  Completed   Fall Risk 09/30/2020 04/05/2021 05/28/2021 10/06/2021 11/02/2021  Falls in the past year? 0 0 0 0 0  Was there an injury with Fall? - 0 0 0 0  Fall Risk Category Calculator - 0 0 0 0  Fall Risk Category - Low Low Low Low  Patient Fall Risk Level - Low fall risk Low fall risk Low fall risk Low fall risk  Patient at Risk for Falls Due to - No Fall Risks No Fall Risks No Fall Risks No Fall Risks  Fall risk Follow up - Falls evaluation completed Falls evaluation completed Falls evaluation completed Falls evaluation completed   Functional Status Survey:    There were no vitals filed for this visit. There is no  height or weight on file to calculate BMI. Physical Exam Vitals reviewed.  Constitutional:      General: She is not in acute distress.    Appearance: Normal appearance. She is normal weight. She is not ill-appearing or diaphoretic.  HENT:     Head: Normocephalic.     Right Ear: There is impacted cerumen.     Left Ear: There is impacted cerumen.     Ears:     Comments: Bilateral ear cerumen lavaged with warm water and hydrogen peroxide moderate amounts of cerumen obtained.Tolerated procedure well.TM clear without any signs of infection.No instrument used      Nose: Nose normal. No congestion or rhinorrhea.     Mouth/Throat:     Mouth: Mucous membranes are moist.     Pharynx: Oropharynx is  clear. No oropharyngeal exudate or posterior oropharyngeal erythema.  Eyes:     General: No scleral icterus.       Right eye: No discharge.        Left eye: No discharge.     Extraocular Movements: Extraocular movements intact.     Conjunctiva/sclera: Conjunctivae normal.     Pupils: Pupils are equal, round, and reactive to light.  Neck:     Vascular: No carotid bruit.  Cardiovascular:     Rate and Rhythm: Normal rate and regular rhythm.     Heart sounds: Normal heart sounds. No murmur heard.   No friction rub. No gallop.     Comments: Left AKA  Pulmonary:     Effort: Pulmonary effort is normal. No respiratory distress.     Breath sounds: Normal breath sounds. No wheezing, rhonchi or rales.  Chest:     Chest wall: No tenderness.  Abdominal:     General: Bowel sounds are normal. There is no distension.     Palpations: Abdomen is soft. There is no mass.     Tenderness: There is no abdominal tenderness. There is no right CVA tenderness, left CVA tenderness, guarding or rebound.  Musculoskeletal:        General: No swelling or tenderness.     Cervical back: Normal range of motion. No rigidity or tenderness.     Right lower leg: No edema.     Comments: On wheelchair during visit  Left AKA   Lymphadenopathy:     Cervical: No cervical adenopathy.  Skin:    General: Skin is warm and dry.     Coloration: Skin is not pale.     Findings: No erythema.  Neurological:     Mental Status: She is alert and oriented to person, place, and time.     Cranial Nerves: No cranial nerve deficit.     Sensory: No sensory deficit.     Motor: No weakness.     Coordination: Coordination normal.     Gait: Gait normal.  Psychiatric:        Mood and Affect: Mood normal.        Speech: Speech normal.        Behavior: Behavior normal.    Labs reviewed: Recent Labs    04/05/21 1008 09/22/21 0958  NA 140 142  K 3.3* 3.5  CL 106 104  CO2 25 26  GLUCOSE 97 107*  BUN 20 16  CREATININE 1.11* 1.16*   CALCIUM 10.1 9.6   Recent Labs    04/05/21 1008 09/22/21 0958  AST 18 13  ALT 13 8  BILITOT 0.6 0.6  PROT 7.8 7.8   Recent Labs  04/05/21 1008 09/22/21 0958  WBC 5.7 6.8  NEUTROABS 2,947 3,767  HGB 14.0 14.4  HCT 40.4 42.2  MCV 93.3 93.4  PLT 212 233   Lab Results  Component Value Date   TSH 2.47 09/22/2021   Lab Results  Component Value Date   HGBA1C 5.8 (H) 09/22/2021   Lab Results  Component Value Date   CHOL 131 09/22/2021   HDL 45 (L) 09/22/2021   LDLCALC 66 09/22/2021   TRIG 122 09/22/2021   CHOLHDL 2.9 09/22/2021    Significant Diagnostic Results in last 30 days:  No results found.  Assessment/Plan  1. Essential hypertension B/p at goal  - continue on amlodipine 10 mg tablet daily and Carvedilol 3.125 mg twice daily. - continue on ASA and Statin  - Advised to check Blood pressure at home and record on log provided and notify provider if B/p > 140/90   2. Bilateral impacted cerumen Bilateral ear cerumen lavaged with warm water and hydrogen peroxide moderate amounts of cerumen obtained.Tolerated procedure well.TM clear without any signs of infection.No instrument used   Family/ staff Communication: Reviewed plan of care with patient verbalized understanding   Labs/tests ordered: None   Next Appointment: As needed if symptoms worsen or fail to improve    Sandrea Hughs, NP

## 2021-11-02 NOTE — Patient Instructions (Signed)
-   Continue on amlodipine 10 mg tablet daily  - Check Blood pressure at home and record on log provided and notify provider if B/p > 140/90

## 2021-12-29 ENCOUNTER — Other Ambulatory Visit: Payer: Self-pay | Admitting: Family

## 2022-04-01 ENCOUNTER — Other Ambulatory Visit: Payer: Medicare Other

## 2022-04-05 ENCOUNTER — Other Ambulatory Visit: Payer: Medicare Other

## 2022-04-05 DIAGNOSIS — R7303 Prediabetes: Secondary | ICD-10-CM | POA: Diagnosis not present

## 2022-04-05 DIAGNOSIS — N183 Chronic kidney disease, stage 3 unspecified: Secondary | ICD-10-CM | POA: Diagnosis not present

## 2022-04-05 DIAGNOSIS — E785 Hyperlipidemia, unspecified: Secondary | ICD-10-CM | POA: Diagnosis not present

## 2022-04-05 DIAGNOSIS — I129 Hypertensive chronic kidney disease with stage 1 through stage 4 chronic kidney disease, or unspecified chronic kidney disease: Secondary | ICD-10-CM | POA: Diagnosis not present

## 2022-04-06 ENCOUNTER — Encounter: Payer: Medicare Other | Admitting: Family

## 2022-04-06 DIAGNOSIS — Z1231 Encounter for screening mammogram for malignant neoplasm of breast: Secondary | ICD-10-CM

## 2022-04-06 LAB — COMPLETE METABOLIC PANEL WITH GFR
AG Ratio: 1.3 (calc) (ref 1.0–2.5)
ALT: 9 U/L (ref 6–29)
AST: 20 U/L (ref 10–35)
Albumin: 4.3 g/dL (ref 3.6–5.1)
Alkaline phosphatase (APISO): 118 U/L (ref 37–153)
BUN/Creatinine Ratio: 12 (calc) (ref 6–22)
BUN: 17 mg/dL (ref 7–25)
CO2: 24 mmol/L (ref 20–32)
Calcium: 9.2 mg/dL (ref 8.6–10.4)
Chloride: 107 mmol/L (ref 98–110)
Creat: 1.37 mg/dL — ABNORMAL HIGH (ref 0.60–1.00)
Globulin: 3.2 g/dL (calc) (ref 1.9–3.7)
Glucose, Bld: 98 mg/dL (ref 65–99)
Potassium: 4.7 mmol/L (ref 3.5–5.3)
Sodium: 140 mmol/L (ref 135–146)
Total Bilirubin: 0.8 mg/dL (ref 0.2–1.2)
Total Protein: 7.5 g/dL (ref 6.1–8.1)
eGFR: 42 mL/min/{1.73_m2} — ABNORMAL LOW (ref >=60)

## 2022-04-06 LAB — CBC WITH DIFFERENTIAL/PLATELET
Absolute Monocytes: 719 cells/uL (ref 200–950)
Basophils Absolute: 37 cells/uL (ref 0–200)
Basophils Relative: 0.6 %
Eosinophils Absolute: 223 cells/uL (ref 15–500)
Eosinophils Relative: 3.6 %
HCT: 40.2 % (ref 35.0–45.0)
Hemoglobin: 14 g/dL (ref 11.7–15.5)
Lymphs Abs: 1922 cells/uL (ref 850–3900)
MCH: 32.1 pg (ref 27.0–33.0)
MCHC: 34.8 g/dL (ref 32.0–36.0)
MCV: 92.2 fL (ref 80.0–100.0)
MPV: 11.1 fL (ref 7.5–12.5)
Monocytes Relative: 11.6 %
Neutro Abs: 3298 cells/uL (ref 1500–7800)
Neutrophils Relative %: 53.2 %
Platelets: 173 10*3/uL (ref 140–400)
RBC: 4.36 10*6/uL (ref 3.80–5.10)
RDW: 13.1 % (ref 11.0–15.0)
Total Lymphocyte: 31 %
WBC: 6.2 10*3/uL (ref 3.8–10.8)

## 2022-04-06 LAB — LIPID PANEL
Cholesterol: 121 mg/dL (ref <200)
HDL: 38 mg/dL — ABNORMAL LOW (ref >=50)
LDL Cholesterol (Calc): 62 mg/dL (calc)
Non-HDL Cholesterol (Calc): 83 mg/dL (calc) (ref <130)
Total CHOL/HDL Ratio: 3.2 (calc) (ref <5.0)
Triglycerides: 125 mg/dL (ref <150)

## 2022-04-06 LAB — HEMOGLOBIN A1C
Hgb A1c MFr Bld: 5.6 % of total Hgb (ref <5.7)
Mean Plasma Glucose: 114 mg/dL
eAG (mmol/L): 6.3 mmol/L

## 2022-04-11 NOTE — Progress Notes (Signed)
  This encounter was created in error - please disregard. No show 

## 2022-04-13 ENCOUNTER — Ambulatory Visit: Payer: Medicare Other | Admitting: Family

## 2022-04-18 ENCOUNTER — Other Ambulatory Visit: Payer: Self-pay | Admitting: Family

## 2022-04-18 ENCOUNTER — Encounter: Payer: Self-pay | Admitting: Family

## 2022-04-18 ENCOUNTER — Ambulatory Visit: Payer: Medicare Other | Admitting: Family

## 2022-04-18 VITALS — BP 138/84 | HR 55 | Temp 96.6°F | Resp 20 | Ht 64.0 in | Wt 156.2 lb

## 2022-04-18 DIAGNOSIS — Z89612 Acquired absence of left leg above knee: Secondary | ICD-10-CM

## 2022-04-18 DIAGNOSIS — I129 Hypertensive chronic kidney disease with stage 1 through stage 4 chronic kidney disease, or unspecified chronic kidney disease: Secondary | ICD-10-CM

## 2022-04-18 DIAGNOSIS — R7303 Prediabetes: Secondary | ICD-10-CM

## 2022-04-18 DIAGNOSIS — E785 Hyperlipidemia, unspecified: Secondary | ICD-10-CM

## 2022-04-18 DIAGNOSIS — N183 Chronic kidney disease, stage 3 unspecified: Secondary | ICD-10-CM

## 2022-04-18 DIAGNOSIS — Z8673 Personal history of transient ischemic attack (TIA), and cerebral infarction without residual deficits: Secondary | ICD-10-CM

## 2022-04-18 NOTE — Progress Notes (Signed)
Provider: Marlowe Sax FNP-C   September Mormile, Nelda Bucks, NP  Patient Care Team: Osie Amparo, Nelda Bucks, NP as PCP - General (Family Medicine) Nahser, Wonda Cheng, MD as PCP - Cardiology (Cardiology)  Extended Emergency Contact Information Primary Emergency Contact: Claudina Lick, [place] [address] Johnnette Litter of [place] Phone: [phone] Relation: Niece Secondary Emergency Contact: [place] Phone: [phone] Relation: Sister  Code Status:  Full Code  Goals of care: Advanced Directive information    11/02/2021    9:05 AM  Advanced Directives  Does Patient Have a Medical Advance Directive? No  Would patient like information on creating a medical advance directive? No - Patient declined     Chief Complaint  Patient presents with   Medical Management of Chronic Issues    Patient presents today for a 6 month follow-up   Quality Metric Gaps    Mammogram, zoster, COVID #3    HPI:  Pt is a 71 y.o. female seen today for 6 months for medical management of chronic diseases.  She denies any acute issues. Recent lab results done on 04/05/2022 reviewed and discussed during visit with patient.  Labs unremarkable except creatinine slightly worse 1.37 previous was 1.16 and hemoglobin A1c has improved  5.6 compared to previous 5.8. She was worried that she might have gained too much weight.  States has been snacking most of the time while spending her time in the room.  Though has lost 3 pounds since last visit. She reports no fall episode or recent hospitalization. Has not been wearing her prosthetic max upper thigh with 2 large to be able to wear her regular pants.  Also concerned that admits left eg larger than the right leg.Has not contacted prosthesis  to company   Past Medical History:  Diagnosis Date   Clotting disorder ([place])    Hyperlipidemia    Hypertension    Peripheral vascular disease ([place])    vein surgery, left leg   Stroke ([place]) 2002   ongoing mild  loss of feeling in left side, had to learn to write right-handed   Substance abuse ([place])    Tobacco abuse    Past Surgical History:  Procedure Laterality Date   AMPUTATION Left 07/01/2019   Procedure: AMPUTATION ABOVE KNEE, left;  Surgeon: Rosetta Posner, MD;  Location: [facility];  Service: Vascular;  Laterality: Left;   AORTA - BILATERAL FEMORAL ARTERY BYPASS GRAFT N/A 07/14/2016   Procedure: AORTOBIFEMORAL BYPASS GRAFT;  Surgeon: Waynetta Sandy, MD;  Location: [place];  Service: Vascular;  Laterality: N/A;   AORTA - BILATERAL FEMORAL ARTERY BYPASS GRAFT Left 10/03/2018   Procedure: THROMBECTOMY OF LEFT LEG, LEFT FEMORAL-POPLITEAL BYPASS GRAFT, RIGHT TO LEFT FEMORAL-FEMORAL BYPASS GRAFT;  Surgeon: Rosetta Posner, MD;  Location: [facility];  Service: Vascular;  Laterality: Left;   CARDIAC CATHETERIZATION N/A 07/11/2016   Procedure: Left Heart Cath and Coronary Angiography;  Surgeon: Nelva Bush, MD;  Location: [facility];  Service: Cardiovascular;  Laterality: N/A;   FEMORAL-POPLITEAL BYPASS GRAFT Left 07/14/2016   Procedure: REDO BYPASS GRAFT FEMORAL-POPLITEAL ARTERY;  Surgeon: Waynetta Sandy, MD;  Location: [place];  Service: Vascular;  Laterality: Left;   LOWER EXTREMITY ANGIOGRAPHY N/A 06/26/2019   Procedure: LOWER EXTREMITY ANGIOGRAPHY;  Surgeon: Marty Heck, MD;  Location: [facility];  Service: Cardiovascular;  Laterality: N/A;   LOWER EXTREMITY INTERVENTION Left 06/27/2019   Procedure: LOWER EXTREMITY INTERVENTION;  Surgeon: Monica Martinez  J, MD;  Location: [facility];  Service: Cardiovascular;  Laterality: Left;   PERIPHERAL VASCULAR CATHETERIZATION N/A 07/08/2016   Procedure: Abdominal Aortogram w/ Bilateral Lower Extremity Runoff;  Surgeon: Elam Dutch, MD;  Location: [facility];  Service: Cardiovascular;  Laterality: N/A;   PERIPHERAL VASCULAR THROMBECTOMY Left 06/27/2019   Procedure: LYSIS RECHECK;  Surgeon: Marty Heck, MD;  Location: [facility];  Service: Cardiovascular;  Laterality: Left;   VEIN SURGERY Left     Allergies  Allergen Reactions   Penicillins Swelling    Has patient had a PCN reaction causing immediate rash, facial/tongue/throat swelling, SOB or lightheadedness with hypotension: YES Has patient had a PCN reaction causing severe rash involving mucus membranes or skin necrosis: NO Has patient had a PCN reaction that required hospitalization NO Has patient had a PCN reaction occurring within the last 10 years: NO If all of the above answers are "NO", then may proceed with Cephalosporin use.    Allergies as of 04/18/2022       Reactions   Penicillins Swelling   Has patient had a PCN reaction causing immediate rash, facial/tongue/throat swelling, SOB or lightheadedness with hypotension: YES Has patient had a PCN reaction causing severe rash involving mucus membranes or skin necrosis: NO Has patient had a PCN reaction that required hospitalization NO Has patient had a PCN reaction occurring within the last 10 years: NO If all of the above answers are "NO", then may proceed with Cephalosporin use.        Medication List        Accurate as of April 18, 2022  9:34 PM. If you have any questions, ask your nurse or doctor.          amLODipine 10 MG tablet Commonly known as: NORVASC Take 1 tablet (10 mg total) by mouth daily. DX I10   Aspirin Low Dose 81 MG tablet Generic drug: aspirin EC TAKE 1 TABLET (81 MG TOTAL) BY MOUTH DAILY. SWALLOW WHOLE.   atorvastatin 80 MG tablet Commonly known as: LIPITOR TAKE 1 TABLET BY MOUTH EVERY DAY   carvedilol 3.125 MG tablet Commonly known as: COREG TAKE 1 TABLET (3.125 MG TOTAL) BY MOUTH 2 (TWO) TIMES DAILY WITH A MEAL.   mineral oil-hydrophilic petrolatum ointment Apply topically 2 (two) times daily. Apply to lower extremities   Oyster Shell Calcium w/D 500-5 MG-MCG Tabs TAKE 1 TABLET BY MOUTH TWICE A DAY        Review  of Systems  Constitutional:  Negative for appetite change, chills, fatigue, fever and unexpected weight change.  HENT:  Negative for congestion, dental problem, ear discharge, ear pain, facial swelling, hearing loss, nosebleeds, postnasal drip, rhinorrhea, sinus pressure, sinus pain, sneezing, sore throat, tinnitus and trouble swallowing.   Eyes:  Negative for pain, discharge, redness, itching and visual disturbance.  Respiratory:  Negative for cough, chest tightness, shortness of breath and wheezing.   Cardiovascular:  Negative for chest pain, palpitations and leg swelling.  Gastrointestinal:  Negative for abdominal distention, abdominal pain, blood in stool, constipation, diarrhea, nausea and vomiting.  Endocrine: Negative for cold intolerance, heat intolerance, polydipsia, polyphagia and polyuria.  Genitourinary:  Negative for difficulty urinating, dysuria, flank pain, frequency and urgency.  Musculoskeletal:  Positive for gait problem. Negative for arthralgias, back pain, joint swelling, myalgias, neck pain and neck stiffness.  Skin:  Negative for color change, pallor, rash and wound.  Neurological:  Negative for dizziness, syncope, speech difficulty, weakness, light-headedness, numbness  and headaches.  Hematological:  Does not bruise/bleed easily.  Psychiatric/Behavioral:  Negative for agitation, behavioral problems, confusion, hallucinations, self-injury, sleep disturbance and suicidal ideas. The patient is not nervous/anxious.     Immunization History  Administered Date(s) Administered   Fluad Quad(high Dose 65+) 09/03/2019, 09/30/2020, 10/06/2021   Influenza, High Dose Seasonal PF 11/02/2018   Influenza,inj,Quad PF,6+ Mos 09/30/2016, 07/19/2017   PFIZER(Purple Top)SARS-COV-2 Vaccination 04/02/2020, 04/23/2020   Pneumococcal Conjugate-13 03/13/2020   Pneumococcal Polysaccharide-23 04/04/2017   Pertinent  Health Maintenance Due  Topic Date Due   MAMMOGRAM  11/02/2019   COLONOSCOPY  (Pts 45-29yr Insurance coverage will need to be confirmed)  09/19/2028 (Originally 12/20/1996)   INFLUENZA VACCINE  04/19/2022   DEXA SCAN  Completed      04/05/2021    9:17 AM 05/28/2021    9:48 AM 10/06/2021    9:55 AM 11/02/2021    9:05 AM 04/18/2022    9:35 AM  Fall Risk  Falls in the past year? 0 0 0 0 0  Was there an injury with Fall? 0 0 0 0 0  Fall Risk Category Calculator 0 0 0 0 0  Fall Risk Category Low Low Low Low Low  Patient Fall Risk Level Low fall risk Low fall risk Low fall risk Low fall risk Low fall risk  Patient at Risk for Falls Due to No Fall Risks No Fall Risks No Fall Risks No Fall Risks No Fall Risks  Fall risk Follow up Falls evaluation completed Falls evaluation completed Falls evaluation completed Falls evaluation completed Falls evaluation completed   Functional Status Survey:    Vitals:   04/18/22 0933  BP: 138/84  Pulse: (!) 55  Resp: 20  Temp: (!) 96.6 F (35.9 C)  SpO2: 98%  Weight: 156 lb 3.2 oz (70.9 kg)  Height: 5' 4"  (1.626 m)   Body mass index is 26.81 kg/m. Physical Exam Vitals reviewed.  Constitutional:      General: She is not in acute distress.    Appearance: Normal appearance. She is normal weight. She is not ill-appearing or diaphoretic.  HENT:     Head: Normocephalic.     Right Ear: Tympanic membrane, ear canal and external ear normal. There is no impacted cerumen.     Left Ear: Tympanic membrane, ear canal and external ear normal. There is no impacted cerumen.     Nose: Nose normal. No congestion or rhinorrhea.     Mouth/Throat:     Mouth: Mucous membranes are moist.     Pharynx: Oropharynx is clear. No oropharyngeal exudate or posterior oropharyngeal erythema.  Eyes:     General: No scleral icterus.       Right eye: No discharge.        Left eye: No discharge.     Extraocular Movements: Extraocular movements intact.     Conjunctiva/sclera: Conjunctivae normal.     Pupils: Pupils are equal, round, and reactive to light.   Neck:     Vascular: No carotid bruit.  Cardiovascular:     Rate and Rhythm: Normal rate and regular rhythm.     Heart sounds: Normal heart sounds. No murmur heard.    No friction rub. No gallop.     Comments: Left AKA  Pulmonary:     Effort: Pulmonary effort is normal. No respiratory distress.     Breath sounds: Normal breath sounds. No wheezing, rhonchi or rales.  Chest:     Chest wall: No tenderness.  Abdominal:  General: Bowel sounds are normal. There is no distension.     Palpations: Abdomen is soft. There is no mass.     Tenderness: There is no abdominal tenderness. There is no right CVA tenderness, left CVA tenderness, guarding or rebound.  Musculoskeletal:        General: No swelling or tenderness. Normal range of motion.     Cervical back: Normal range of motion. No rigidity or tenderness.     Right lower leg: No edema.     Comments: Left AKA   Lymphadenopathy:     Cervical: No cervical adenopathy.  Skin:    General: Skin is warm and dry.     Coloration: Skin is not pale.     Findings: No bruising, erythema, lesion or rash.  Neurological:     Mental Status: She is alert and oriented to person, place, and time.     Cranial Nerves: No cranial nerve deficit.     Sensory: No sensory deficit.     Motor: No weakness.     Coordination: Coordination normal.     Gait: Gait normal.  Psychiatric:        Mood and Affect: Mood normal.        Speech: Speech normal.        Behavior: Behavior normal.        Thought Content: Thought content normal.        Judgment: Judgment normal.     Labs reviewed: Recent Labs    09/22/21 0958 04/05/22 1050  NA 142 140  K 3.5 4.7  CL 104 107  CO2 26 24  GLUCOSE 107* 98  BUN 16 17  CREATININE 1.16* 1.37*  CALCIUM 9.6 9.2   Recent Labs    09/22/21 0958 04/05/22 1050  AST 13 20  ALT 8 9  BILITOT 0.6 0.8  PROT 7.8 7.5   Recent Labs    09/22/21 0958 04/05/22 1050  WBC 6.8 6.2  NEUTROABS 3,767 3,298  HGB 14.4 14.0  HCT  42.2 40.2  MCV 93.4 92.2  PLT 233 173   Lab Results  Component Value Date   TSH 2.47 09/22/2021   Lab Results  Component Value Date   HGBA1C 5.6 04/05/2022   Lab Results  Component Value Date   CHOL 121 04/05/2022   HDL 38 (L) 04/05/2022   LDLCALC 62 04/05/2022   TRIG 125 04/05/2022   CHOLHDL 3.2 04/05/2022    Significant Diagnostic Results in last 30 days:  No results found.  Assessment/Plan 1. Benign hypertension with CKD (chronic kidney disease) stage III (HCC) B/p well controlled  -Continue on amlodipine and carvedilol - COMPLETE METABOLIC PANEL WITH GFR; Future - CBC with Differential/Platelet; Future  2. Hyperlipidemia LDL goal <70 LDL at goal Continue on atorvastatin Dietary modification discussed at length - Lipid panel; Future  3. Prediabetes Lab Results  Component Value Date   HGBA1C 5.6 04/05/2022  Hemoglobin A1c has improved compared to previous Not on any medication -Dietary modification advised - TSH; Future - Hemoglobin A1c; Future  4. Status post above-knee amputation of left lower extremity (HCC) Has prosthetic Platinol does not wear since it makes her left leg bigger than the right leg and unable to fit her pants . -Advised to contact prosthetic company.  Family/ staff Communication: Reviewed plan of care with patient verbalized understanding  Labs/tests ordered: - CBC with Differential/Platelet - CMP with eGFR([facility]) - TSH - Hgb A1C - Lipid panel  Next Appointment : Return in about 6  months (around 10/19/2022) for medical mangement of chronic issues., Fasting labs in 6 months prior to visit.   Sandrea Hughs, NP

## 2022-05-30 ENCOUNTER — Encounter: Payer: Medicare Other | Admitting: Family

## 2022-06-02 ENCOUNTER — Telehealth: Payer: Self-pay

## 2022-06-02 ENCOUNTER — Encounter: Payer: Self-pay | Admitting: Adult Health

## 2022-06-02 ENCOUNTER — Ambulatory Visit (INDEPENDENT_AMBULATORY_CARE_PROVIDER_SITE_OTHER): Payer: Medicare Other | Admitting: Adult Health

## 2022-06-02 VITALS — Wt 156.0 lb

## 2022-06-02 DIAGNOSIS — Z0000 Encounter for general adult medical examination without abnormal findings: Secondary | ICD-10-CM

## 2022-06-02 NOTE — Progress Notes (Signed)
I connected with  Wendy Townsend on 06/02/22 by a video enabled telemedicine application and verified that I am speaking with the correct person using two identifiers.   I discussed the limitations of evaluation and management by telemedicine. The patient expressed understanding and agreed to proceed.  [name] are scheduled for a virtual visit with your provider today.    Just as we do with appointments in the office, we must obtain your consent to participate.  Your consent will be active for this visit and any virtual visit you may have with one of our providers in the next 365 days.    If you have a MyChart account, I can also send a copy of this consent to you electronically.  All virtual visits are billed to your insurance company just like a traditional visit in the office.  As this is a virtual visit, video technology does not allow for your provider to perform a traditional examination.  This may limit your provider's ability to fully assess your condition.  If your provider identifies any concerns that need to be evaluated in person or the need to arrange testing such as labs, EKG, etc, we will make arrangements to do so.    Although advances in technology are sophisticated, we cannot ensure that it will always work on either your end or our end.  If the connection with a video visit is poor, we may have to switch to a telephone visit.  With either a video or telephone visit, we are not always able to ensure that we have a secure connection.   I need to obtain your verbal consent now.   Are you willing to proceed with your visit today?   Wendy Townsend has provided verbal consent on 06/02/2022 for a virtual visit (video or telephone).   Wendy Nam Medina-Vargas, NP 06/02/2022  2:41 PM    Subjective:   Wendy Townsend is a 71 y.o. female who presents for Medicare Annual (Subsequent) preventive examination.  Review of Systems      Does not feel sick at the moment.      Objective:    Today's Vitals   06/02/22 1355  Weight: 156 lb (70.8 kg)  PainSc: 0-No pain   Body mass index is 26.78 kg/m.     11/02/2021    9:05 AM 10/06/2021    9:55 AM 05/28/2021    9:53 AM 04/05/2021    9:18 AM 05/26/2020    9:36 AM 03/13/2020    9:05 AM 07/30/2019    8:15 AM  Advanced Directives  Does Patient Have a Medical Advance Directive? No No No No No No No  Would patient like information on creating a medical advance directive? No - Patient declined No - Patient declined No - Patient declined No - Patient declined No - Patient declined No - Patient declined No - Patient declined    Current Medications (verified) Outpatient Encounter Medications as of 06/02/2022  Medication Sig   amLODipine (NORVASC) 10 MG tablet Take 1 tablet (10 mg total) by mouth daily. DX I10   ASPIRIN LOW DOSE 81 MG tablet TAKE 1 TABLET (81 MG TOTAL) BY MOUTH DAILY. SWALLOW WHOLE.   atorvastatin (LIPITOR) 80 MG tablet TAKE 1 TABLET BY MOUTH EVERY DAY   Calcium Carb-Cholecalciferol (OYSTER SHELL CALCIUM W/D) 500-5 MG-MCG TABS TAKE 1 TABLET BY MOUTH TWICE A DAY   carvedilol (COREG) 3.125 MG tablet TAKE 1 TABLET (3.125 MG TOTAL) BY MOUTH 2 (TWO) TIMES DAILY WITH A MEAL.  mineral oil-hydrophilic petrolatum (AQUAPHOR) ointment Apply topically 2 (two) times daily. Apply to lower extremities   No facility-administered encounter medications on file as of 06/02/2022.    Allergies (verified) Penicillins   History: Past Medical History:  Diagnosis Date   Clotting disorder ([place])    Hyperlipidemia    Hypertension    Peripheral vascular disease ([place])    vein surgery, left leg   Stroke ([place]) 2002   ongoing mild loss of feeling in left side, had to learn to write right-handed   Substance abuse ([place])    Tobacco abuse    Past Surgical History:  Procedure Laterality Date   AMPUTATION Left 07/01/2019   Procedure: AMPUTATION ABOVE KNEE, left;  Surgeon: Rosetta Posner, MD;  Location: [facility];  Service:  Vascular;  Laterality: Left;   AORTA - BILATERAL FEMORAL ARTERY BYPASS GRAFT N/A 07/14/2016   Procedure: AORTOBIFEMORAL BYPASS GRAFT;  Surgeon: Waynetta Sandy, MD;  Location: [facility];  Service: Vascular;  Laterality: N/A;   AORTA - BILATERAL FEMORAL ARTERY BYPASS GRAFT Left 10/03/2018   Procedure: THROMBECTOMY OF LEFT LEG, LEFT FEMORAL-POPLITEAL BYPASS GRAFT, RIGHT TO LEFT FEMORAL-FEMORAL BYPASS GRAFT;  Surgeon: Rosetta Posner, MD;  Location: [facility];  Service: Vascular;  Laterality: Left;   CARDIAC CATHETERIZATION N/A 07/11/2016   Procedure: Left Heart Cath and Coronary Angiography;  Surgeon: Nelva Bush, MD;  Location: [facility];  Service: Cardiovascular;  Laterality: N/A;   FEMORAL-POPLITEAL BYPASS GRAFT Left 07/14/2016   Procedure: REDO BYPASS GRAFT FEMORAL-POPLITEAL ARTERY;  Surgeon: Waynetta Sandy, MD;  Location: [facility];  Service: Vascular;  Laterality: Left;   LOWER EXTREMITY ANGIOGRAPHY N/A 06/26/2019   Procedure: LOWER EXTREMITY ANGIOGRAPHY;  Surgeon: Marty Heck, MD;  Location: [facility];  Service: Cardiovascular;  Laterality: N/A;   LOWER EXTREMITY INTERVENTION Left 06/27/2019   Procedure: LOWER EXTREMITY INTERVENTION;  Surgeon: Marty Heck, MD;  Location: [facility];  Service: Cardiovascular;  Laterality: Left;   PERIPHERAL VASCULAR CATHETERIZATION N/A 07/08/2016   Procedure: Abdominal Aortogram w/ Bilateral Lower Extremity Runoff;  Surgeon: Elam Dutch, MD;  Location: [facility];  Service: Cardiovascular;  Laterality: N/A;   PERIPHERAL VASCULAR THROMBECTOMY Left 06/27/2019   Procedure: LYSIS RECHECK;  Surgeon: Marty Heck, MD;  Location: [facility];  Service: Cardiovascular;  Laterality: Left;   VEIN SURGERY Left    Family History  Problem Relation Age of Onset   Hypertension Mother 94   Cancer Brother    Cancer Sister    Colon cancer Neg Hx    Colon polyps Neg Hx    Esophageal cancer Neg Hx     Rectal cancer Neg Hx    Stomach cancer Neg Hx    Social History   Socioeconomic History   Marital status: Widowed    Spouse name: Not on file   Number of children: Not on file   Years of education: Not on file   Highest education level: Not on file  Occupational History   Occupation: retired  Tobacco Use   Smoking status: Former    Years: 40.00    Types: Cigarettes    Quit date: 05/2017    Years since quitting: 5.0   Smokeless tobacco: Never   Tobacco comments:    10/04/2018 "stopped for awhile then started back"  Vaping Use   Vaping Use: Never used  Substance and Sexual Activity   Alcohol use: Not Currently    Comment: 2-3 times  a year   Drug use: Yes    Types: Marijuana    Comment: when she can't sleep sometimes at night   Sexual activity: Not Currently  Other Topics Concern   Not on file  Social History Narrative   DIET: None      DO YOU DRINK/EAT THINGS WITH CAFFEINE: no      MARITAL STATUS: Widow      WHAT YEAR WERE YOU MARRIED: 1990      DO YOU LIVE IN A HOUSE, APARTMENT, ASSISTED LIVING, CONDO TRAILER ETC.:  House      IS IT ONE OR MORE STORIES: One      HOW MANY PERSONS LIVE IN YOUR HOME: 3      DO YOU HAVE PETS IN YOUR HOME: no      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      DO YOU EXERCISE: sometimes      WHAT TYPE AND HOW OFTEN: once a week   Social Determinants of Health   Financial Resource Strain: Low Risk  (03/02/2018)   Overall Financial Resource Strain (CARDIA)    Difficulty of Paying Living Expenses: Not hard at all  Food Insecurity: No Food Insecurity (03/02/2018)   Hunger Vital Sign    Worried About Running Out of Food in the Last Year: Never true    Ran Out of Food in the Last Year: Never true  Transportation Needs: No Transportation Needs (03/02/2018)   PRAPARE - Hydrologist (Medical): No    Lack of Transportation (Non-Medical): No  Physical Activity: Inactive (03/02/2018)   Exercise Vital Sign     Days of Exercise per Week: 0 days    Minutes of Exercise per Session: 0 min  Stress: No Stress Concern Present (03/02/2018)   [place]    Feeling of Stress : Only a little  Social Connections: Somewhat Isolated (03/02/2018)   Social Connection and Isolation Panel [NHANES]    Frequency of Communication with Friends and Family: Three times a week    Frequency of Social Gatherings with Friends and Family: Three times a week    Attends Religious Services: 1 to 4 times per year    Active Member of Clubs or Organizations: No    Attends Archivist Meetings: Never    Marital Status: Widowed    Tobacco Counseling Counseling given: Not Answered Tobacco comments: 10/04/2018 "stopped for awhile then started back"   Clinical Intake:  Pre-visit preparation completed: No  Pain : No/denies pain Pain Score: 0-No pain     BMI - recorded: 26.78 Nutritional Status: BMI 25 -29 Overweight Nutritional Risks: None Diabetes: No  How often do you need to have someone help you when you read instructions, pamphlets, or other written materials from your doctor or pharmacy?: 1 - Never What is the last grade level you completed in school?: 2 years of college  Diabetic? No  Interpreter Needed?: No  Information entered by :: Olumide Dolinger Medina-Vargas DNP   Activities of Daily Living     No data to display          Patient Care Team: Ngetich, Nelda Bucks, NP as PCP - General (Family Medicine) Nahser, Wonda Cheng, MD as PCP - Cardiology (Cardiology)  Indicate any recent Medical Services you may have received from other than [facility] providers in the past year (date may be approximate).     Assessment:   This is a routine wellness examination for  Wendy Townsend.  Hearing/Vision screen No results found.  Dietary issues and exercise activities discussed: crunches and leg lift exercises.     Goals Addressed             This  Visit's Progress    Set My Weight Loss Goal       Follow Up Date 06/03/2023    - set weight loss goal   wants to lose 20 lbs   Why is this important?   Losing only 5 to 15 percent of your weight makes a big difference in your health.    Notes:        Depression Screen    05/28/2021    9:48 AM 09/30/2020   10:07 AM 05/26/2020    9:29 AM 12/26/2019   10:10 AM 03/02/2018    8:55 AM 11/25/2016    9:05 AM 09/30/2016   11:20 AM  PHQ 2/9 Scores  PHQ - 2 Score 0 0 0 0 0 0 0  PHQ- 9 Score      0     Fall Risk    04/18/2022    9:35 AM 11/02/2021    9:05 AM 10/06/2021    9:55 AM 05/28/2021    9:48 AM 04/05/2021    9:17 AM  Fall Risk   Falls in the past year? 0 0 0 0 0  Number falls in past yr: 0 0 0 0 0  Injury with Fall? 0 0 0 0 0  Risk for fall due to : No Fall Risks No Fall Risks No Fall Risks No Fall Risks No Fall Risks  Follow up Falls evaluation completed Falls evaluation completed Falls evaluation completed Falls evaluation completed Falls evaluation completed    FALL RISK PREVENTION PERTAINING TO THE HOME:  Any stairs in or around the home? No  If so, are there any without handrails?  N/A Home free of loose throw rugs in walkways, pet beds, electrical cords, etc? Yes  Adequate lighting in your home to reduce risk of falls? Yes   ASSISTIVE DEVICES UTILIZED TO PREVENT FALLS:  Life alert? Yes  Use of a cane, walker or w/c? Yes  Grab bars in the bathroom? Yes  Shower chair or bench in shower? Yes  Elevated toilet seat or a handicapped toilet? Yes   TIMED UP AND GO:  Was the test performed? No .  Length of time to ambulate 10 feet: N/A sec.   Gait unsteady with use of assistive device, provider informed and education provided.   Cognitive Function:    03/02/2018    9:00 AM 09/30/2016   11:29 AM  MMSE - Mini Mental State Exam  Orientation to time 4 5  Orientation to Place 4 4  Registration 3 3  Attention/ Calculation 5 5  Recall 2 3  Language- name 2 objects 2 2   Language- repeat 1 1  Language- follow 3 step command 3 3  Language- read & follow direction 1 1  Write a sentence 1 1  Copy design 1 1  Total score 27 29        05/28/2021    9:50 AM 05/26/2020    9:29 AM  6CIT Screen  What Year? 0 points 0 points  What month? 0 points 0 points  What time? 0 points 0 points  Count back from 20 0 points 0 points  Months in reverse 0 points 0 points  Repeat phrase 0 points 0 points  Total Score 0 points 0 points  Immunizations Immunization History  Administered Date(s) Administered   Fluad Quad(high Dose 65+) 09/03/2019, 09/30/2020, 10/06/2021   Influenza, High Dose Seasonal PF 11/02/2018   Influenza,inj,Quad PF,6+ Mos 09/30/2016, 07/19/2017   PFIZER(Purple Top)SARS-COV-2 Vaccination 04/02/2020, 04/23/2020   Pneumococcal Conjugate-13 03/13/2020   Pneumococcal Polysaccharide-23 04/04/2017    TDAP status: Up to date  Flu Vaccine status: Up to date  Pneumococcal vaccine status: Up to date  Covid-19 vaccine status: Completed vaccines  Qualifies for Shingles Vaccine? No   Zostavax completed No   Shingrix Completed?: No.    Education has been provided regarding the importance of this vaccine. Patient has been advised to call insurance company to determine out of pocket expense if they have not yet received this vaccine. Advised may also receive vaccine at local pharmacy or Health Dept. Verbalized acceptance and understanding.  Screening Tests Health Maintenance  Topic Date Due   Zoster Vaccines- Shingrix (1 of 2) Never done   MAMMOGRAM  11/02/2019   COVID-19 Vaccine (3 - Pfizer series) 06/18/2020   INFLUENZA VACCINE  04/19/2022   COLONOSCOPY (Pts 45-81yrs Insurance coverage will need to be confirmed)  09/19/2028 (Originally 12/20/1996)   TETANUS/TDAP  09/19/2028 (Originally 12/21/1970)   Pneumonia Vaccine 55+ Years old  Completed   DEXA SCAN  Completed   Hepatitis C Screening  Completed   HPV VACCINES  Aged Out    Health  Maintenance  Health Maintenance Due  Topic Date Due   Zoster Vaccines- Shingrix (1 of 2) Never done   MAMMOGRAM  11/02/2019   COVID-19 Vaccine (3 - Pfizer series) 06/18/2020   INFLUENZA VACCINE  04/19/2022    Colorectal cancer screening: No longer required.  Declined  Mammogram status: Completed 2022. Repeat every year  Bone Density status: Ordered Declined. Pt provided with contact info and advised to call to schedule appt.  Lung Cancer Screening: (Low Dose CT Chest recommended if Age 20-80 years, 30 pack-year currently smoking OR have quit w/in 15years.) does not qualify.   Lung Cancer Screening Referral:  Declined  Additional Screening:  Hepatitis C Screening: does not qualify; Completed  Declined  Vision Screening: Recommended annual ophthalmology exams for early detection of glaucoma and other disorders of the eye. Is the patient up to date with their annual eye exam?  Yes  Who is the provider or what is the name of the office in which the patient attends annual eye exams? Yes If pt is not established with a provider, would they like to be referred to a provider to establish care? No .   Dental Screening: Recommended annual dental exams for proper oral hygiene  Community Resource Referral / Chronic Care Management: CRR required this visit?  No   CCM required this visit?  No      Plan:     I have personally reviewed and noted the following in the patient's chart:   Medical and social history Use of alcohol, tobacco or illicit drugs  Current medications and supplements including opioid prescriptions. Patient is not currently taking opioid prescriptions. Functional ability and status Nutritional status Physical activity Advanced directives List of other physicians Hospitalizations, surgeries, and ER visits in previous 12 months Vitals Screenings to include cognitive, depression, and falls Referrals and appointments  In addition, I have reviewed and discussed  with patient certain preventive protocols, quality metrics, and best practice recommendations. A written personalized care plan for preventive services as well as general preventive health recommendations were provided to patient.     Durenda Age, NP  06/02/2022   Nurse Notes:  Will need to have annual wellness visit.

## 2022-06-02 NOTE — Telephone Encounter (Signed)
Patient contacted. She gave consent to do her telephone visit today for her Medicare Annual Wellness visit

## 2022-07-25 ENCOUNTER — Other Ambulatory Visit: Payer: Self-pay | Admitting: Family

## 2022-10-13 ENCOUNTER — Other Ambulatory Visit: Payer: 59

## 2022-10-13 DIAGNOSIS — R7303 Prediabetes: Secondary | ICD-10-CM

## 2022-10-13 DIAGNOSIS — I129 Hypertensive chronic kidney disease with stage 1 through stage 4 chronic kidney disease, or unspecified chronic kidney disease: Secondary | ICD-10-CM

## 2022-10-13 DIAGNOSIS — E785 Hyperlipidemia, unspecified: Secondary | ICD-10-CM

## 2022-10-17 ENCOUNTER — Other Ambulatory Visit: Payer: 59

## 2022-10-17 DIAGNOSIS — N183 Chronic kidney disease, stage 3 unspecified: Secondary | ICD-10-CM | POA: Diagnosis not present

## 2022-10-17 DIAGNOSIS — E785 Hyperlipidemia, unspecified: Secondary | ICD-10-CM | POA: Diagnosis not present

## 2022-10-17 DIAGNOSIS — I129 Hypertensive chronic kidney disease with stage 1 through stage 4 chronic kidney disease, or unspecified chronic kidney disease: Secondary | ICD-10-CM | POA: Diagnosis not present

## 2022-10-17 DIAGNOSIS — R7303 Prediabetes: Secondary | ICD-10-CM | POA: Diagnosis not present

## 2022-10-18 LAB — COMPLETE METABOLIC PANEL WITH GFR
AG Ratio: 1.4 (calc) (ref 1.0–2.5)
ALT: 8 U/L (ref 6–29)
AST: 15 U/L (ref 10–35)
Albumin: 4.4 g/dL (ref 3.6–5.1)
Alkaline phosphatase (APISO): 122 U/L (ref 37–153)
BUN/Creatinine Ratio: 10 (calc) (ref 6–22)
BUN: 11 mg/dL (ref 7–25)
CO2: 28 mmol/L (ref 20–32)
Calcium: 9.7 mg/dL (ref 8.6–10.4)
Chloride: 105 mmol/L (ref 98–110)
Creat: 1.12 mg/dL — ABNORMAL HIGH (ref 0.60–1.00)
Globulin: 3.2 g/dL (calc) (ref 1.9–3.7)
Glucose, Bld: 102 mg/dL (ref 65–139)
Potassium: 3.4 mmol/L — ABNORMAL LOW (ref 3.5–5.3)
Sodium: 142 mmol/L (ref 135–146)
Total Bilirubin: 0.6 mg/dL (ref 0.2–1.2)
Total Protein: 7.6 g/dL (ref 6.1–8.1)
eGFR: 53 mL/min/{1.73_m2} — ABNORMAL LOW (ref >=60)

## 2022-10-18 LAB — LIPID PANEL
Cholesterol: 96 mg/dL (ref <200)
HDL: 38 mg/dL — ABNORMAL LOW (ref >=50)
LDL Cholesterol (Calc): 41 mg/dL (calc)
Non-HDL Cholesterol (Calc): 58 mg/dL (calc) (ref <130)
Total CHOL/HDL Ratio: 2.5 (calc) (ref <5.0)
Triglycerides: 83 mg/dL (ref <150)

## 2022-10-18 LAB — CBC WITH DIFFERENTIAL/PLATELET
Absolute Monocytes: 619 cells/uL (ref 200–950)
Basophils Absolute: 42 cells/uL (ref 0–200)
Basophils Relative: 0.8 %
Eosinophils Absolute: 151 cells/uL (ref 15–500)
Eosinophils Relative: 2.9 %
HCT: 44 % (ref 35.0–45.0)
Hemoglobin: 15.1 g/dL (ref 11.7–15.5)
Lymphs Abs: 2252 cells/uL (ref 850–3900)
MCH: 32.3 pg (ref 27.0–33.0)
MCHC: 34.3 g/dL (ref 32.0–36.0)
MCV: 94 fL (ref 80.0–100.0)
MPV: 10.9 fL (ref 7.5–12.5)
Monocytes Relative: 11.9 %
Neutro Abs: 2137 cells/uL (ref 1500–7800)
Neutrophils Relative %: 41.1 %
Platelets: 228 10*3/uL (ref 140–400)
RBC: 4.68 10*6/uL (ref 3.80–5.10)
RDW: 12.9 % (ref 11.0–15.0)
Total Lymphocyte: 43.3 %
WBC: 5.2 10*3/uL (ref 3.8–10.8)

## 2022-10-18 LAB — HEMOGLOBIN A1C
Hgb A1c MFr Bld: 6.1 % of total Hgb — ABNORMAL HIGH (ref <5.7)
Mean Plasma Glucose: 128 mg/dL
eAG (mmol/L): 7.1 mmol/L

## 2022-10-18 LAB — TSH: TSH: 3.37 mIU/L (ref 0.40–4.50)

## 2022-10-19 ENCOUNTER — Encounter: Payer: Medicare Other | Admitting: Family

## 2022-10-31 NOTE — Progress Notes (Signed)
  This encounter was created in error - please disregard. No show 

## 2022-11-01 ENCOUNTER — Other Ambulatory Visit: Payer: 59

## 2022-11-03 ENCOUNTER — Encounter: Payer: Self-pay | Admitting: Family

## 2022-11-03 ENCOUNTER — Ambulatory Visit (INDEPENDENT_AMBULATORY_CARE_PROVIDER_SITE_OTHER): Payer: 59 | Admitting: Family

## 2022-11-03 VITALS — BP 120/70 | HR 66 | Temp 96.7°F | Resp 16 | Ht 64.0 in

## 2022-11-03 DIAGNOSIS — E785 Hyperlipidemia, unspecified: Secondary | ICD-10-CM

## 2022-11-03 DIAGNOSIS — Z89612 Acquired absence of left leg above knee: Secondary | ICD-10-CM | POA: Diagnosis not present

## 2022-11-03 DIAGNOSIS — I129 Hypertensive chronic kidney disease with stage 1 through stage 4 chronic kidney disease, or unspecified chronic kidney disease: Secondary | ICD-10-CM | POA: Diagnosis not present

## 2022-11-03 DIAGNOSIS — Z23 Encounter for immunization: Secondary | ICD-10-CM | POA: Diagnosis not present

## 2022-11-03 DIAGNOSIS — E876 Hypokalemia: Secondary | ICD-10-CM | POA: Diagnosis not present

## 2022-11-03 DIAGNOSIS — Z1231 Encounter for screening mammogram for malignant neoplasm of breast: Secondary | ICD-10-CM

## 2022-11-03 DIAGNOSIS — R7303 Prediabetes: Secondary | ICD-10-CM | POA: Diagnosis not present

## 2022-11-03 DIAGNOSIS — I739 Peripheral vascular disease, unspecified: Secondary | ICD-10-CM

## 2022-11-03 DIAGNOSIS — H6123 Impacted cerumen, bilateral: Secondary | ICD-10-CM

## 2022-11-03 DIAGNOSIS — N183 Chronic kidney disease, stage 3 unspecified: Secondary | ICD-10-CM | POA: Diagnosis not present

## 2022-11-03 MED ORDER — DEBROX 6.5 % OT SOLN: 5.0000 [drp] | Freq: Two times a day (BID) | OTIC | 0 refills | Status: AC

## 2022-11-03 NOTE — Patient Instructions (Signed)
-   Instill debrox 6.5 otic solution 5 drops into each ear twice daily x 4 days then follow up for ear lavage.May apply cotton ball at bedtime to prevent drainage to pillow.  

## 2022-11-03 NOTE — Progress Notes (Signed)
Provider: Marlowe Sax FNP-C   Trinitee Horgan, Nelda Bucks, NP  Patient Care Team: Kenika Sahm, Nelda Bucks, NP as PCP - General (Family Medicine) Nahser, Wonda Cheng, MD as PCP - Cardiology (Cardiology)  Extended Emergency Contact Information Primary Emergency Contact: Claudina Lick, [place] [address] [place] Mobile Phone: [phone] Relation: Niece  Code Status:  Full Code  Goals of care: Advanced Directive information    11/03/2022    9:20 AM  Advanced Directives  Does Patient Have a Medical Advance Directive? No  Would patient like information on creating a medical advance directive? No - Patient declined     Chief Complaint  Patient presents with   Medical Management of Chronic Issues    6 month follow up.    Health Maintenance    Discuss the need for Mammogram.    Immunizations    Discuss the need for DTAP vaccine, Shingrix vaccine, Influenza vaccine, and Covid Booster. NCIR VERIFIED    HPI:  Pt is a 72 y.o. female seen today for 6 months follow up for medical management of chronic diseases. Recent lab results reviewed and discussed during visit.    Hgb A1C 6.1 not checking blood sugar at home.   K+ 3.4   CR 1.12 previous 1.3   States not wearing left leg prosthesis thigh large than the left unable to wear her pants when she puts on her prosthesis.    Due for Influenza,shingles ,COVID-19 ,and Tdap vaccine.will get influenza vaccine today.aware to get the rest at the pharmacy but does not want the shingles vaccine.   Past Medical History:  Diagnosis Date   Clotting disorder ([place])    Hyperlipidemia    Hypertension    Peripheral vascular disease ([place])    vein surgery, left leg   Stroke ([place]) 2002   ongoing mild loss of feeling in left side, had to learn to write right-handed   Substance abuse ([place])    Tobacco abuse    Past Surgical History:  Procedure Laterality Date   AMPUTATION Left 07/01/2019   Procedure: AMPUTATION ABOVE KNEE, left;   Surgeon: Rosetta Posner, MD;  Location: [facility];  Service: Vascular;  Laterality: Left;   AORTA - BILATERAL FEMORAL ARTERY BYPASS GRAFT N/A 07/14/2016   Procedure: AORTOBIFEMORAL BYPASS GRAFT;  Surgeon: Waynetta Sandy, MD;  Location: [facility];  Service: Vascular;  Laterality: N/A;   AORTA - BILATERAL FEMORAL ARTERY BYPASS GRAFT Left 10/03/2018   Procedure: THROMBECTOMY OF LEFT LEG, LEFT FEMORAL-POPLITEAL BYPASS GRAFT, RIGHT TO LEFT FEMORAL-FEMORAL BYPASS GRAFT;  Surgeon: Rosetta Posner, MD;  Location: [facility];  Service: Vascular;  Laterality: Left;   CARDIAC CATHETERIZATION N/A 07/11/2016   Procedure: Left Heart Cath and Coronary Angiography;  Surgeon: Nelva Bush, MD;  Location: [facility];  Service: Cardiovascular;  Laterality: N/A;   FEMORAL-POPLITEAL BYPASS GRAFT Left 07/14/2016   Procedure: REDO BYPASS GRAFT FEMORAL-POPLITEAL ARTERY;  Surgeon: Waynetta Sandy, MD;  Location: [facility];  Service: Vascular;  Laterality: Left;   LOWER EXTREMITY ANGIOGRAPHY N/A 06/26/2019   Procedure: LOWER EXTREMITY ANGIOGRAPHY;  Surgeon: Marty Heck, MD;  Location: [facility];  Service: Cardiovascular;  Laterality: N/A;   LOWER EXTREMITY INTERVENTION Left 06/27/2019   Procedure: LOWER EXTREMITY INTERVENTION;  Surgeon: Marty Heck, MD;  Location: [facility];  Service: Cardiovascular;  Laterality: Left;   PERIPHERAL VASCULAR CATHETERIZATION N/A 07/08/2016   Procedure: Abdominal Aortogram w/ Bilateral Lower  Extremity Runoff;  Surgeon: Elam Dutch, MD;  Location: [facility];  Service: Cardiovascular;  Laterality: N/A;   PERIPHERAL VASCULAR THROMBECTOMY Left 06/27/2019   Procedure: LYSIS RECHECK;  Surgeon: Marty Heck, MD;  Location: [facility];  Service: Cardiovascular;  Laterality: Left;   VEIN SURGERY Left     Allergies  Allergen Reactions   Penicillins Swelling    Has patient had a PCN reaction causing immediate rash,  facial/tongue/throat swelling, SOB or lightheadedness with hypotension: YES Has patient had a PCN reaction causing severe rash involving mucus membranes or skin necrosis: NO Has patient had a PCN reaction that required hospitalization NO Has patient had a PCN reaction occurring within the last 10 years: NO If all of the above answers are "NO", then may proceed with Cephalosporin use.    Allergies as of 11/03/2022       Reactions   Penicillins Swelling   Has patient had a PCN reaction causing immediate rash, facial/tongue/throat swelling, SOB or lightheadedness with hypotension: YES Has patient had a PCN reaction causing severe rash involving mucus membranes or skin necrosis: NO Has patient had a PCN reaction that required hospitalization NO Has patient had a PCN reaction occurring within the last 10 years: NO If all of the above answers are "NO", then may proceed with Cephalosporin use.        Medication List        Accurate as of November 03, 2022  9:22 AM. If you have any questions, ask your nurse or doctor.          amLODipine 10 MG tablet Commonly known as: NORVASC Take 1 tablet (10 mg total) by mouth daily. DX I10   Aspirin Low Dose 81 MG tablet Generic drug: aspirin EC TAKE 1 TABLET (81 MG TOTAL) BY MOUTH DAILY. SWALLOW WHOLE.   atorvastatin 80 MG tablet Commonly known as: LIPITOR TAKE 1 TABLET BY MOUTH EVERY DAY   carvedilol 3.125 MG tablet Commonly known as: COREG TAKE 1 TABLET (3.125 MG TOTAL) BY MOUTH 2 (TWO) TIMES DAILY WITH A MEAL.   mineral oil-hydrophilic petrolatum ointment Apply topically 2 (two) times daily. Apply to lower extremities   Oyster Shell Calcium w/D 500-5 MG-MCG Tabs TAKE 1 TABLET BY MOUTH TWICE A DAY        Review of Systems  Constitutional:  Negative for appetite change, chills, fatigue, fever and unexpected weight change.  HENT:  Negative for congestion, dental problem, ear discharge, ear pain, facial swelling, hearing loss,  nosebleeds, postnasal drip, rhinorrhea, sinus pressure, sinus pain, sneezing, sore throat, tinnitus and trouble swallowing.   Eyes:  Negative for pain, discharge, redness, itching and visual disturbance.  Respiratory:  Negative for cough, chest tightness, shortness of breath and wheezing.   Cardiovascular:  Negative for chest pain, palpitations and leg swelling.  Gastrointestinal:  Negative for abdominal distention, abdominal pain, blood in stool, constipation, diarrhea, nausea and vomiting.  Endocrine: Negative for cold intolerance, heat intolerance, polydipsia, polyphagia and polyuria.  Genitourinary:  Negative for difficulty urinating, dysuria, flank pain, frequency and urgency.  Musculoskeletal:  Positive for gait problem. Negative for arthralgias, back pain, joint swelling, myalgias, neck pain and neck stiffness.       Left thigh large than the right unable to wear prosthesis.  Skin:  Negative for color change, pallor, rash and wound.  Neurological:  Negative for dizziness, syncope, speech difficulty, weakness, light-headedness, numbness and headaches.  Hematological:  Does not bruise/bleed easily.  Psychiatric/Behavioral:  Negative  for agitation, behavioral problems, confusion, hallucinations, self-injury, sleep disturbance and suicidal ideas. The patient is not nervous/anxious.     Immunization History  Administered Date(s) Administered   Fluad Quad(high Dose 65+) 09/03/2019, 09/30/2020, 10/06/2021   Influenza, High Dose Seasonal PF 11/02/2018   Influenza,inj,Quad PF,6+ Mos 09/30/2016, 07/19/2017   PFIZER(Purple Top)SARS-COV-2 Vaccination 04/02/2020, 04/23/2020   Pneumococcal Conjugate-13 03/13/2020   Pneumococcal Polysaccharide-23 04/04/2017   Pertinent  Health Maintenance Due  Topic Date Due   MAMMOGRAM  11/02/2019   INFLUENZA VACCINE  04/19/2022   COLONOSCOPY (Pts 45-78yr Insurance coverage will need to be confirmed)  09/19/2028 (Originally 12/20/1996)   DEXA SCAN  Completed       05/28/2021    9:48 AM 10/06/2021    9:55 AM 11/02/2021    9:05 AM 04/18/2022    9:35 AM 11/03/2022    9:09 AM  Fall Risk  Falls in the past year? 0 0 0 0 0  Was there an injury with Fall? 0 0 0 0 0  Fall Risk Category Calculator 0 0 0 0 0  Fall Risk Category (Retired) Low Low Low Low   (RETIRED) Patient Fall Risk Level Low fall risk Low fall risk Low fall risk Low fall risk   Patient at Risk for Falls Due to No Fall Risks No Fall Risks No Fall Risks No Fall Risks No Fall Risks  Fall risk Follow up Falls evaluation completed Falls evaluation completed Falls evaluation completed Falls evaluation completed Falls evaluation completed;Education provided;Falls prevention discussed   Functional Status Survey:    Vitals:   11/03/22 0915  BP: 120/70  Pulse: 66  Resp: 16  Temp: (!) 96.7 F (35.9 C)  SpO2: 98%  Height: 5' 4"$  (1.626 m)   Body mass index is 26.78 kg/m. Physical Exam Vitals reviewed.  Constitutional:      General: She is not in acute distress.    Appearance: Normal appearance. She is overweight. She is not ill-appearing or diaphoretic.  HENT:     Head: Normocephalic.     Right Ear: Tympanic membrane, ear canal and external ear normal. There is impacted cerumen.     Left Ear: Tympanic membrane, ear canal and external ear normal. There is impacted cerumen.     Nose: Nose normal. No congestion or rhinorrhea.     Mouth/Throat:     Mouth: Mucous membranes are moist.     Pharynx: Oropharynx is clear. No oropharyngeal exudate or posterior oropharyngeal erythema.  Eyes:     General: No scleral icterus.       Right eye: No discharge.        Left eye: No discharge.     Extraocular Movements: Extraocular movements intact.     Conjunctiva/sclera: Conjunctivae normal.     Pupils: Pupils are equal, round, and reactive to light.  Neck:     Vascular: No carotid bruit.  Cardiovascular:     Rate and Rhythm: Normal rate and regular rhythm.     Pulses: Normal pulses.            Left dorsalis pedis pulse not accessible.        Left posterior tibial pulse not accessible.     Heart sounds: Normal heart sounds. No murmur heard.    No friction rub. No gallop.     Comments: Left AKA Pulmonary:     Effort: Pulmonary effort is normal. No respiratory distress.     Breath sounds: Normal breath sounds. No wheezing, rhonchi or rales.  Chest:  Chest wall: No tenderness.  Abdominal:     General: Bowel sounds are normal. There is no distension.     Palpations: Abdomen is soft. There is no mass.     Tenderness: There is no abdominal tenderness. There is no right CVA tenderness, left CVA tenderness, guarding or rebound.  Musculoskeletal:        General: No swelling or tenderness.     Cervical back: Normal range of motion. No rigidity or tenderness.     Right lower leg: No edema.     Comments: Left AKA      Left Lower Extremity: Left leg is amputated above knee.  Lymphadenopathy:     Cervical: No cervical adenopathy.  Skin:    General: Skin is warm and dry.     Coloration: Skin is not pale.     Findings: No bruising, erythema, lesion or rash.  Neurological:     Mental Status: She is alert and oriented to person, place, and time.     Cranial Nerves: No cranial nerve deficit.     Sensory: No sensory deficit.     Motor: No weakness.     Coordination: Coordination normal.     Gait: Gait abnormal.  Psychiatric:        Mood and Affect: Mood normal.        Speech: Speech normal.        Behavior: Behavior normal.        Thought Content: Thought content normal.        Judgment: Judgment normal.     Labs reviewed: Recent Labs    04/05/22 1050 10/17/22 0902  NA 140 142  K 4.7 3.4*  CL 107 105  CO2 24 28  GLUCOSE 98 102  BUN 17 11  CREATININE 1.37* 1.12*  CALCIUM 9.2 9.7   Recent Labs    04/05/22 1050 10/17/22 0902  AST 20 15  ALT 9 8  BILITOT 0.8 0.6  PROT 7.5 7.6   Recent Labs    04/05/22 1050 10/17/22 0902  WBC 6.2 5.2  NEUTROABS 3,298 2,137   HGB 14.0 15.1  HCT 40.2 44.0  MCV 92.2 94.0  PLT 173 228   Lab Results  Component Value Date   TSH 3.37 10/17/2022   Lab Results  Component Value Date   HGBA1C 6.1 (H) 10/17/2022   Lab Results  Component Value Date   CHOL 96 10/17/2022   HDL 38 (L) 10/17/2022   LDLCALC 41 10/17/2022   TRIG 83 10/17/2022   CHOLHDL 2.5 10/17/2022    Significant Diagnostic Results in last 30 days:  No results found.  Assessment/Plan 1. Benign hypertension with CKD (chronic kidney disease) stage III (HCC) B/p well controlled  - continue coreg and amlodipine  - continue with dietary modification  - TSH; Future - COMPLETE METABOLIC PANEL WITH GFR; Future - CBC with Differential/Platelet; Future  2. Hyperlipidemia LDL goal <70 LDL at goal  - continue on atorvastatin  - Lipid panel; Future  3. Prediabetes Lab Results  Component Value Date   HGBA1C 6.1 (H) 10/17/2022  Dietary modification advised.Exercise limited due to left AKA but encouraged to do chair exercises.  - Hemoglobin A1c; Future  4. Status post above-knee amputation of left lower extremity (HCC) Unable to wear her left prosthesis due to left thigh larger than the right unable to put on pants with prosthesis.advised to notify prothesis company.   5. PAD (peripheral artery disease) ([place]) Continue on Statin   6. Breast cancer  screening by mammogram Asymptomatic  - MM DIGITAL SCREENING BILATERAL  7. Need for influenza vaccination Afebrile  Flut shot administered by CMA no acute reaction reported.  - Flu Vaccine QUAD High Dose(Fluad)  8. Hypokalemia K+ 3.4  Will recheck level.Not on diuretic  - Basic metabolic panel - COMPLETE METABOLIC PANEL WITH GFR; Future  9. Bilateral impacted cerumen TM not visualized due to impaction. - Instill debrox 6.5 otic solution 5 drops into each ear twice daily x 4 days then follow up for ear lavage.May apply cotton ball at bedtime to prevent drainage to pillow. - carbamide  peroxide (DEBROX) 6.5 % OTIC solution; Place 5 drops into both ears 2 (two) times daily for 4 days.  Dispense: 2 mL; Refill: 0  Family/ staff Communication: Reviewed plan of care with patient verbalized understanding.  Labs/tests ordered:  - CBC with Differential/Platelet - CMP with eGFR([facility]) - TSH - Hgb A1C - Lipid panel - Basic metabolic panel  Next Appointment : Return in about 6 months (around 05/04/2023) for medical mangement of chronic issues., Fasting labs in 6 months prior to visit. 1 week bilear lavage .   Sandrea Hughs, NP

## 2022-11-04 ENCOUNTER — Other Ambulatory Visit: Payer: Self-pay | Admitting: Family

## 2022-11-04 LAB — BASIC METABOLIC PANEL
BUN/Creatinine Ratio: 10 (calc) (ref 6–22)
BUN: 11 mg/dL (ref 7–25)
CO2: 25 mmol/L (ref 20–32)
Calcium: 9.3 mg/dL (ref 8.6–10.4)
Chloride: 108 mmol/L (ref 98–110)
Creat: 1.1 mg/dL — ABNORMAL HIGH (ref 0.60–1.00)
Glucose, Bld: 91 mg/dL (ref 65–99)
Potassium: 3.7 mmol/L (ref 3.5–5.3)
Sodium: 144 mmol/L (ref 135–146)

## 2022-11-17 ENCOUNTER — Other Ambulatory Visit: Payer: Self-pay | Admitting: Family

## 2022-11-17 DIAGNOSIS — I129 Hypertensive chronic kidney disease with stage 1 through stage 4 chronic kidney disease, or unspecified chronic kidney disease: Secondary | ICD-10-CM

## 2022-11-23 ENCOUNTER — Other Ambulatory Visit: Payer: Self-pay | Admitting: Family

## 2022-11-23 DIAGNOSIS — I129 Hypertensive chronic kidney disease with stage 1 through stage 4 chronic kidney disease, or unspecified chronic kidney disease: Secondary | ICD-10-CM

## 2023-05-02 ENCOUNTER — Other Ambulatory Visit: Payer: 59

## 2023-05-04 ENCOUNTER — Ambulatory Visit: Payer: 59 | Admitting: Family

## 2023-05-26 ENCOUNTER — Other Ambulatory Visit: Payer: Self-pay | Admitting: Family

## 2023-05-26 DIAGNOSIS — I129 Hypertensive chronic kidney disease with stage 1 through stage 4 chronic kidney disease, or unspecified chronic kidney disease: Secondary | ICD-10-CM

## 2023-05-26 DIAGNOSIS — N183 Chronic kidney disease, stage 3 unspecified: Secondary | ICD-10-CM

## 2023-05-26 DIAGNOSIS — Z8673 Personal history of transient ischemic attack (TIA), and cerebral infarction without residual deficits: Secondary | ICD-10-CM

## 2023-05-30 ENCOUNTER — Other Ambulatory Visit: Payer: 59

## 2023-05-30 DIAGNOSIS — N183 Chronic kidney disease, stage 3 unspecified: Secondary | ICD-10-CM | POA: Diagnosis not present

## 2023-05-30 DIAGNOSIS — E785 Hyperlipidemia, unspecified: Secondary | ICD-10-CM | POA: Diagnosis not present

## 2023-05-30 DIAGNOSIS — R7303 Prediabetes: Secondary | ICD-10-CM

## 2023-05-30 DIAGNOSIS — E876 Hypokalemia: Secondary | ICD-10-CM

## 2023-05-30 DIAGNOSIS — I129 Hypertensive chronic kidney disease with stage 1 through stage 4 chronic kidney disease, or unspecified chronic kidney disease: Secondary | ICD-10-CM | POA: Diagnosis not present

## 2023-05-31 LAB — COMPLETE METABOLIC PANEL WITH GFR
AG Ratio: 1.5 (calc) (ref 1.0–2.5)
ALT: 13 U/L (ref 6–29)
AST: 20 U/L (ref 10–35)
Albumin: 4.4 g/dL (ref 3.6–5.1)
Alkaline phosphatase (APISO): 115 U/L (ref 37–153)
BUN/Creatinine Ratio: 15 (calc) (ref 6–22)
BUN: 18 mg/dL (ref 7–25)
CO2: 25 mmol/L (ref 20–32)
Calcium: 9.8 mg/dL (ref 8.6–10.4)
Chloride: 105 mmol/L (ref 98–110)
Creat: 1.19 mg/dL — ABNORMAL HIGH (ref 0.60–1.00)
Globulin: 3 g/dL (ref 1.9–3.7)
Glucose, Bld: 90 mg/dL (ref 65–99)
Potassium: 3.9 mmol/L (ref 3.5–5.3)
Sodium: 143 mmol/L (ref 135–146)
Total Bilirubin: 0.4 mg/dL (ref 0.2–1.2)
Total Protein: 7.4 g/dL (ref 6.1–8.1)
eGFR: 49 mL/min/{1.73_m2} — ABNORMAL LOW (ref >=60)

## 2023-05-31 LAB — LIPID PANEL
Cholesterol: 120 mg/dL (ref <200)
HDL: 49 mg/dL — ABNORMAL LOW (ref >=50)
LDL Cholesterol (Calc): 54 mg/dL
Non-HDL Cholesterol (Calc): 71 mg/dL (ref <130)
Total CHOL/HDL Ratio: 2.4 (calc) (ref <5.0)
Triglycerides: 89 mg/dL (ref <150)

## 2023-05-31 LAB — CBC WITH DIFFERENTIAL/PLATELET
Absolute Monocytes: 470 {cells}/uL (ref 200–950)
Basophils Absolute: 40 {cells}/uL (ref 0–200)
Basophils Relative: 0.8 %
Eosinophils Absolute: 180 {cells}/uL (ref 15–500)
Eosinophils Relative: 3.6 %
HCT: 40.7 % (ref 35.0–45.0)
Hemoglobin: 13.9 g/dL (ref 11.7–15.5)
Lymphs Abs: 2260 {cells}/uL (ref 850–3900)
MCH: 32.3 pg (ref 27.0–33.0)
MCHC: 34.2 g/dL (ref 32.0–36.0)
MCV: 94.4 fL (ref 80.0–100.0)
MPV: 11.4 fL (ref 7.5–12.5)
Monocytes Relative: 9.4 %
Neutro Abs: 2050 {cells}/uL (ref 1500–7800)
Neutrophils Relative %: 41 %
Platelets: 205 10*3/uL (ref 140–400)
RBC: 4.31 10*6/uL (ref 3.80–5.10)
RDW: 13.7 % (ref 11.0–15.0)
Total Lymphocyte: 45.2 %
WBC: 5 10*3/uL (ref 3.8–10.8)

## 2023-05-31 LAB — HEMOGLOBIN A1C
Hgb A1c MFr Bld: 5.9 %{Hb} — ABNORMAL HIGH (ref <5.7)
Mean Plasma Glucose: 123 mg/dL
eAG (mmol/L): 6.8 mmol/L

## 2023-05-31 LAB — TSH: TSH: 4.59 m[IU]/L — ABNORMAL HIGH (ref 0.40–4.50)

## 2023-06-01 ENCOUNTER — Encounter: Payer: Self-pay | Admitting: Family

## 2023-06-01 ENCOUNTER — Ambulatory Visit (INDEPENDENT_AMBULATORY_CARE_PROVIDER_SITE_OTHER): Payer: 59 | Admitting: Family

## 2023-06-01 VITALS — BP 138/74 | HR 63 | Temp 97.4°F | Resp 16 | Ht 64.0 in | Wt 142.0 lb

## 2023-06-01 DIAGNOSIS — G8194 Hemiplegia, unspecified affecting left nondominant side: Secondary | ICD-10-CM

## 2023-06-01 DIAGNOSIS — Z23 Encounter for immunization: Secondary | ICD-10-CM

## 2023-06-01 DIAGNOSIS — E785 Hyperlipidemia, unspecified: Secondary | ICD-10-CM | POA: Diagnosis not present

## 2023-06-01 DIAGNOSIS — I129 Hypertensive chronic kidney disease with stage 1 through stage 4 chronic kidney disease, or unspecified chronic kidney disease: Secondary | ICD-10-CM

## 2023-06-01 DIAGNOSIS — I739 Peripheral vascular disease, unspecified: Secondary | ICD-10-CM | POA: Diagnosis not present

## 2023-06-01 DIAGNOSIS — N183 Chronic kidney disease, stage 3 unspecified: Secondary | ICD-10-CM | POA: Diagnosis not present

## 2023-06-01 DIAGNOSIS — R7303 Prediabetes: Secondary | ICD-10-CM | POA: Diagnosis not present

## 2023-06-01 NOTE — Progress Notes (Signed)
Provider: Richarda Blade FNP-C   Rosealie Reach, Donalee Citrin, NP  Patient Care Team: Tyteanna Ost, Donalee Citrin, NP as PCP - General (Family Medicine) Nahser, Deloris Ping, MD as PCP - Cardiology (Cardiology)  Extended Emergency Contact Information Primary Emergency Contact: Earl [place], [place] [address] [place] Mobile Phone: [phone] Relation: Niece  Code Status:  Full Code  Goals of care: Advanced Directive information    06/01/2023   10:21 AM  Advanced Directives  Does Patient Have a Medical Advance Directive? No     Chief Complaint  Patient presents with   Follow-up    6  month follow up    HPI:  Pt is a 72 y.o. female seen today for 6 months follow up for  medical management of chronic diseases.  She denies any acute issues.   Recent lab results reviewed and discussed during the visit.  - Type 2 DM - A1c has improved 5.9 previous 6.1   CKD - CR 1.19   Hypertension - no home B/p readings for review.she denies any headache,dizziness,vision changes,fatigue,chest tightness,palpitation,chest pain or shortness of breath.      TSH - 4.59 denies any symptoms of hypothryoidism.  States left hand weakness has improved but not resolved. Wheel self on wheelchair. States left BKA unable to fit prosthesis but did not reach out to prosthetic company.  No fall episode.  Past Medical History:  Diagnosis Date   Clotting disorder (HCC)    Hyperlipidemia    Hypertension    Peripheral vascular disease (HCC)    vein surgery, left leg   Stroke (HCC) 2002   ongoing mild loss of feeling in left side, had to learn to write right-handed   Substance abuse (HCC)    Tobacco abuse    Past Surgical History:  Procedure Laterality Date   AMPUTATION Left 07/01/2019   Procedure: AMPUTATION ABOVE KNEE, left;  Surgeon: Larina Earthly, MD;  Location: [facility] OR;  Service: Vascular;  Laterality: Left;   AORTA - BILATERAL FEMORAL ARTERY BYPASS GRAFT N/A 07/14/2016   Procedure:  AORTOBIFEMORAL BYPASS GRAFT;  Surgeon: Maeola Harman, MD;  Location: [facility] OR;  Service: Vascular;  Laterality: N/A;   AORTA - BILATERAL FEMORAL ARTERY BYPASS GRAFT Left 10/03/2018   Procedure: THROMBECTOMY OF LEFT LEG, LEFT FEMORAL-POPLITEAL BYPASS GRAFT, RIGHT TO LEFT FEMORAL-FEMORAL BYPASS GRAFT;  Surgeon: Larina Earthly, MD;  Location: [facility] OR;  Service: Vascular;  Laterality: Left;   CARDIAC CATHETERIZATION N/A 07/11/2016   Procedure: Left Heart Cath and Coronary Angiography;  Surgeon: Yvonne Kendall, MD;  Location: [facility] INVASIVE CV LAB;  Service: Cardiovascular;  Laterality: N/A;   FEMORAL-POPLITEAL BYPASS GRAFT Left 07/14/2016   Procedure: REDO BYPASS GRAFT FEMORAL-POPLITEAL ARTERY;  Surgeon: Maeola Harman, MD;  Location: [facility] OR;  Service: Vascular;  Laterality: Left;   LOWER EXTREMITY ANGIOGRAPHY N/A 06/26/2019   Procedure: LOWER EXTREMITY ANGIOGRAPHY;  Surgeon: Cephus Shelling, MD;  Location: [facility] INVASIVE CV LAB;  Service: Cardiovascular;  Laterality: N/A;   LOWER EXTREMITY INTERVENTION Left 06/27/2019   Procedure: LOWER EXTREMITY INTERVENTION;  Surgeon: Cephus Shelling, MD;  Location: [facility] INVASIVE CV LAB;  Service: Cardiovascular;  Laterality: Left;   PERIPHERAL VASCULAR CATHETERIZATION N/A 07/08/2016   Procedure: Abdominal Aortogram w/ Bilateral Lower Extremity Runoff;  Surgeon: Sherren Kerns, MD;  Location: [facility] INVASIVE CV LAB;  Service: Cardiovascular;  Laterality: N/A;   PERIPHERAL VASCULAR THROMBECTOMY Left 06/27/2019   Procedure: LYSIS RECHECK;  Surgeon: Chestine Spore,  Canary Brim, MD;  Location: [facility] INVASIVE CV LAB;  Service: Cardiovascular;  Laterality: Left;   VEIN SURGERY Left     Allergies  Allergen Reactions   Penicillins Swelling    Has patient had a PCN reaction causing immediate rash, facial/tongue/throat swelling, SOB or lightheadedness with hypotension: YES Has patient had a PCN reaction causing severe rash involving mucus membranes or skin necrosis:  NO Has patient had a PCN reaction that required hospitalization NO Has patient had a PCN reaction occurring within the last 10 years: NO If all of the above answers are "NO", then may proceed with Cephalosporin use.    Allergies as of 06/01/2023       Reactions   Penicillins Swelling   Has patient had a PCN reaction causing immediate rash, facial/tongue/throat swelling, SOB or lightheadedness with hypotension: YES Has patient had a PCN reaction causing severe rash involving mucus membranes or skin necrosis: NO Has patient had a PCN reaction that required hospitalization NO Has patient had a PCN reaction occurring within the last 10 years: NO If all of the above answers are "NO", then may proceed with Cephalosporin use.        Medication List        Accurate as of June 01, 2023 10:53 AM. If you have any questions, ask your nurse or doctor.          amLODipine 10 MG tablet Commonly known as: NORVASC TAKE 1 TABLET (10 MG TOTAL) BY MOUTH DAILY. DX I10   Aspirin Low Dose 81 MG tablet Generic drug: aspirin EC TAKE 1 TABLET BY MOUTH DAILY *SWALLOW WHOLE*   atorvastatin 80 MG tablet Commonly known as: LIPITOR TAKE 1 TABLET BY MOUTH EVERY DAY   carvedilol 3.125 MG tablet Commonly known as: COREG TAKE 1 TABLET BY MOUTH TWICE A DAY WITH A MEAL   mineral oil-hydrophilic petrolatum ointment Apply topically 2 (two) times daily. Apply to lower extremities   Oyster Shell Calcium w/D 500-5 MG-MCG Tabs TAKE 1 TABLET BY MOUTH TWICE A DAY        Review of Systems  Constitutional:  Negative for appetite change, chills, fatigue, fever and unexpected weight change.  HENT:  Negative for congestion, dental problem, ear discharge, ear pain, facial swelling, hearing loss, nosebleeds, postnasal drip, rhinorrhea, sinus pressure, sinus pain, sneezing, sore throat, tinnitus and trouble swallowing.   Eyes:  Negative for pain, discharge, redness, itching and visual disturbance.   Respiratory:  Negative for cough, chest tightness, shortness of breath and wheezing.   Cardiovascular:  Negative for chest pain, palpitations and leg swelling.  Gastrointestinal:  Negative for abdominal distention, abdominal pain, blood in stool, constipation, diarrhea, nausea and vomiting.  Endocrine: Negative for cold intolerance, heat intolerance, polydipsia, polyphagia and polyuria.  Genitourinary:  Negative for difficulty urinating, dysuria, flank pain, frequency and urgency.  Musculoskeletal:  Positive for gait problem. Negative for arthralgias, back pain, joint swelling, myalgias, neck pain and neck stiffness.       Left BKA   Skin:  Negative for color change, pallor, rash and wound.  Neurological:  Negative for dizziness, syncope, speech difficulty, weakness, light-headedness, numbness and headaches.  Hematological:  Does not bruise/bleed easily.  Psychiatric/Behavioral:  Negative for agitation, behavioral problems, confusion, hallucinations, self-injury, sleep disturbance and suicidal ideas. The patient is not nervous/anxious.     Immunization History  Administered Date(s) Administered   Fluad Quad(high Dose 65+) 09/03/2019, 09/30/2020, 10/06/2021, 11/03/2022   Fluad Trivalent(High Dose 65+) 06/01/2023   Influenza, High Dose Seasonal  PF 11/02/2018   Influenza,inj,Quad PF,6+ Mos 09/30/2016, 07/19/2017   PFIZER(Purple Top)SARS-COV-2 Vaccination 04/02/2020, 04/23/2020   Pneumococcal Conjugate-13 03/13/2020   Pneumococcal Polysaccharide-23 04/04/2017   Pertinent  Health Maintenance Due  Topic Date Due   MAMMOGRAM  11/02/2019   Colonoscopy  09/19/2028 (Originally 12/20/1996)   INFLUENZA VACCINE  Completed   DEXA SCAN  Completed      10/06/2021    9:55 AM 11/02/2021    9:05 AM 04/18/2022    9:35 AM 11/03/2022    9:09 AM 06/01/2023   10:21 AM  Fall Risk  Falls in the past year? 0 0 0 0 0  Was there an injury with Fall? 0 0 0 0   Fall Risk Category Calculator 0 0 0 0   Fall Risk  Category (Retired) Low Low Low    (RETIRED) Patient Fall Risk Level Low fall risk Low fall risk Low fall risk    Patient at Risk for Falls Due to No Fall Risks No Fall Risks No Fall Risks No Fall Risks   Fall risk Follow up Falls evaluation completed Falls evaluation completed Falls evaluation completed Falls evaluation completed;Education provided;Falls prevention discussed    Functional Status Survey:    Vitals:   06/01/23 1021  BP: 138/74  Pulse: 63  Resp: 16  Temp: (!) 97.4 F (36.3 C)  SpO2: 97%  Weight: 142 lb (64.4 kg)  Height: 5\' 4"  (1.626 m)   Body mass index is 24.37 kg/m. Physical Exam Vitals reviewed.  Constitutional:      General: She is not in acute distress.    Appearance: Normal appearance. She is normal weight. She is not ill-appearing or diaphoretic.  HENT:     Head: Normocephalic.     Right Ear: Tympanic membrane, ear canal and external ear normal. There is no impacted cerumen.     Left Ear: Tympanic membrane, ear canal and external ear normal. There is no impacted cerumen.     Nose: Nose normal. No congestion or rhinorrhea.     Mouth/Throat:     Mouth: Mucous membranes are moist.     Pharynx: Oropharynx is clear. No oropharyngeal exudate or posterior oropharyngeal erythema.  Eyes:     General: No scleral icterus.       Right eye: No discharge.        Left eye: No discharge.     Extraocular Movements: Extraocular movements intact.     Conjunctiva/sclera: Conjunctivae normal.     Pupils: Pupils are equal, round, and reactive to light.  Neck:     Vascular: No carotid bruit.  Cardiovascular:     Rate and Rhythm: Normal rate and regular rhythm.     Pulses: Normal pulses.     Heart sounds: Normal heart sounds. No murmur heard.    No friction rub. No gallop.  Pulmonary:     Effort: Pulmonary effort is normal. No respiratory distress.     Breath sounds: Normal breath sounds. No wheezing, rhonchi or rales.  Chest:     Chest wall: No tenderness.   Abdominal:     General: Bowel sounds are normal. There is no distension.     Palpations: Abdomen is soft. There is no mass.     Tenderness: There is no abdominal tenderness. There is no right CVA tenderness, left CVA tenderness, guarding or rebound.  Musculoskeletal:        General: No swelling or tenderness. Normal range of motion.     Cervical back: Normal range of motion.  No rigidity or tenderness.     Right lower leg: No edema.     Left lower leg: No edema.     Left Lower Extremity: Left leg is amputated below knee.  Lymphadenopathy:     Cervical: No cervical adenopathy.  Skin:    General: Skin is warm and dry.     Coloration: Skin is not pale.     Findings: No bruising, erythema, lesion or rash.  Neurological:     Mental Status: She is alert and oriented to person, place, and time.     Cranial Nerves: No cranial nerve deficit.     Sensory: No sensory deficit.     Motor: No weakness.     Coordination: Coordination normal.     Gait: Gait normal.  Psychiatric:        Mood and Affect: Mood normal.        Speech: Speech normal.        Behavior: Behavior normal.        Thought Content: Thought content normal.        Judgment: Judgment normal.    Labs reviewed: Recent Labs    10/17/22 0902 11/03/22 0956 05/30/23 1012  NA 142 144 143  K 3.4* 3.7 3.9  CL 105 108 105  CO2 28 25 25   GLUCOSE 102 91 90  BUN 11 11 18   CREATININE 1.12* 1.10* 1.19*  CALCIUM 9.7 9.3 9.8   Recent Labs    10/17/22 0902 05/30/23 1012  AST 15 20  ALT 8 13  BILITOT 0.6 0.4  PROT 7.6 7.4   Recent Labs    10/17/22 0902 05/30/23 1012  WBC 5.2 5.0  NEUTROABS 2,137 2,050  HGB 15.1 13.9  HCT 44.0 40.7  MCV 94.0 94.4  PLT 228 205   Lab Results  Component Value Date   TSH 4.59 (H) 05/30/2023   Lab Results  Component Value Date   HGBA1C 5.9 (H) 05/30/2023   Lab Results  Component Value Date   CHOL 120 05/30/2023   HDL 49 (L) 05/30/2023   LDLCALC 54 05/30/2023   TRIG 89  05/30/2023   CHOLHDL 2.4 05/30/2023    Significant Diagnostic Results in last 30 days:  No results found.  Assessment/Plan 1. Need for influenza vaccination Flut shot administered by CMA no acute reaction reported.  - Flu Vaccine Trivalent High Dose (Fluad)  2. Benign hypertension with CKD (chronic kidney disease) stage III (HCC) Blood pressure well-controlled  -Continue on amlodipine and carvedilol -Continue on aspirin and atorvastatin for cardiovascular event prevention -Dietary modification and exercise as tolerated -Advised to get your Tetanus,Shingles and COVID-19 vaccine  - TSH; Future - COMPLETE METABOLIC PANEL WITH GFR; Future - CBC with Differential/Platelet; Future  3. Hyperlipidemia LDL goal <70 LDL at goal -Continue on atorvastatin -Continue on dietary modification and exercise as above - Lipid panel; Future  4. Prediabetes Lab Results  Component Value Date   HGBA1C 5.9 (H) 05/30/2023  Dietary modification and exercise discussed at length - Hemoglobin A1c; Future  5. PAD (peripheral artery disease) (HCC) No ulceration noted.  Former cigarette smoker. -On aspirin and atorvastatin.    6. Left hemiparesis (HCC) Has improved but not quite resolved -Continue lifestyle modification  Family/ staff Communication: Reviewed plan of care with patient verbalized understanding  Labs/tests ordered:  - TSH; Future - COMPLETE METABOLIC PANEL WITH GFR; Future - CBC with Differential/Platelet; Future - Hemoglobin A1c; Future - Lipid panel; Future  Next Appointment : Return in about 6  months (around 11/29/2023) for medical mangement of chronic issues., Fasting labs in 6 months prior to visit.   Caesar Bookman, NP

## 2023-06-01 NOTE — Patient Instructions (Signed)
Please get your Tetanus,Shingles and COVID-19 vaccine

## 2023-06-02 ENCOUNTER — Other Ambulatory Visit: Payer: Self-pay | Admitting: Family

## 2023-06-05 ENCOUNTER — Encounter: Payer: 59 | Admitting: Family

## 2023-06-08 ENCOUNTER — Other Ambulatory Visit: Payer: Self-pay | Admitting: Family

## 2023-06-08 DIAGNOSIS — N183 Hypertensive chronic kidney disease with stage 1 through stage 4 chronic kidney disease, or unspecified chronic kidney disease: Secondary | ICD-10-CM

## 2023-08-19 ENCOUNTER — Other Ambulatory Visit: Payer: Self-pay | Admitting: Family

## 2023-09-04 ENCOUNTER — Other Ambulatory Visit: Payer: Self-pay | Admitting: Family

## 2023-11-23 ENCOUNTER — Other Ambulatory Visit: Payer: 59

## 2023-11-23 DIAGNOSIS — R7303 Prediabetes: Secondary | ICD-10-CM

## 2023-11-23 DIAGNOSIS — N183 Chronic kidney disease, stage 3 unspecified: Secondary | ICD-10-CM

## 2023-11-23 DIAGNOSIS — E785 Hyperlipidemia, unspecified: Secondary | ICD-10-CM

## 2023-11-28 ENCOUNTER — Ambulatory Visit: Payer: 59 | Admitting: Family

## 2023-11-29 ENCOUNTER — Other Ambulatory Visit: Payer: Self-pay | Admitting: Family

## 2023-11-29 DIAGNOSIS — I129 Hypertensive chronic kidney disease with stage 1 through stage 4 chronic kidney disease, or unspecified chronic kidney disease: Secondary | ICD-10-CM

## 2023-12-25 ENCOUNTER — Other Ambulatory Visit

## 2023-12-25 DIAGNOSIS — I129 Hypertensive chronic kidney disease with stage 1 through stage 4 chronic kidney disease, or unspecified chronic kidney disease: Secondary | ICD-10-CM | POA: Diagnosis not present

## 2023-12-25 DIAGNOSIS — N183 Chronic kidney disease, stage 3 unspecified: Secondary | ICD-10-CM | POA: Diagnosis not present

## 2023-12-25 DIAGNOSIS — E785 Hyperlipidemia, unspecified: Secondary | ICD-10-CM | POA: Diagnosis not present

## 2023-12-25 DIAGNOSIS — R7303 Prediabetes: Secondary | ICD-10-CM | POA: Diagnosis not present

## 2023-12-26 LAB — COMPREHENSIVE METABOLIC PANEL WITH GFR
AG Ratio: 1.4 (calc) (ref 1.0–2.5)
ALT: 12 U/L (ref 6–29)
AST: 18 U/L (ref 10–35)
Albumin: 4.5 g/dL (ref 3.6–5.1)
Alkaline phosphatase (APISO): 119 U/L (ref 37–153)
BUN/Creatinine Ratio: 14 (calc) (ref 6–22)
BUN: 14 mg/dL (ref 7–25)
CO2: 25 mmol/L (ref 20–32)
Calcium: 10.2 mg/dL (ref 8.6–10.4)
Chloride: 108 mmol/L (ref 98–110)
Creat: 1.03 mg/dL — ABNORMAL HIGH (ref 0.60–1.00)
Globulin: 3.3 g/dL (ref 1.9–3.7)
Glucose, Bld: 94 mg/dL (ref 65–99)
Potassium: 3.5 mmol/L (ref 3.5–5.3)
Sodium: 142 mmol/L (ref 135–146)
Total Bilirubin: 0.7 mg/dL (ref 0.2–1.2)
Total Protein: 7.8 g/dL (ref 6.1–8.1)
eGFR: 58 mL/min/{1.73_m2} — ABNORMAL LOW (ref >=60)

## 2023-12-26 LAB — CBC WITH DIFFERENTIAL/PLATELET
Absolute Lymphocytes: 2072 {cells}/uL (ref 850–3900)
Absolute Monocytes: 661 {cells}/uL (ref 200–950)
Basophils Absolute: 62 {cells}/uL (ref 0–200)
Basophils Relative: 1.1 %
Eosinophils Absolute: 129 {cells}/uL (ref 15–500)
Eosinophils Relative: 2.3 %
HCT: 41.9 % (ref 35.0–45.0)
Hemoglobin: 14.7 g/dL (ref 11.7–15.5)
MCH: 32.9 pg (ref 27.0–33.0)
MCHC: 35.1 g/dL (ref 32.0–36.0)
MCV: 93.7 fL (ref 80.0–100.0)
MPV: 10.9 fL (ref 7.5–12.5)
Monocytes Relative: 11.8 %
Neutro Abs: 2677 {cells}/uL (ref 1500–7800)
Neutrophils Relative %: 47.8 %
Platelets: 179 10*3/uL (ref 140–400)
RBC: 4.47 10*6/uL (ref 3.80–5.10)
RDW: 12.9 % (ref 11.0–15.0)
Total Lymphocyte: 37 %
WBC: 5.6 10*3/uL (ref 3.8–10.8)

## 2023-12-26 LAB — LIPID PANEL
Cholesterol: 111 mg/dL (ref <200)
HDL: 44 mg/dL — ABNORMAL LOW (ref >=50)
LDL Cholesterol (Calc): 50 mg/dL
Non-HDL Cholesterol (Calc): 67 mg/dL (ref <130)
Total CHOL/HDL Ratio: 2.5 (calc) (ref <5.0)
Triglycerides: 91 mg/dL (ref <150)

## 2023-12-26 LAB — HEMOGLOBIN A1C
Hgb A1c MFr Bld: 5.9 %{Hb} — ABNORMAL HIGH (ref <5.7)
Mean Plasma Glucose: 123 mg/dL
eAG (mmol/L): 6.8 mmol/L

## 2023-12-26 LAB — TSH: TSH: 2.4 m[IU]/L (ref 0.40–4.50)

## 2023-12-28 ENCOUNTER — Ambulatory Visit (INDEPENDENT_AMBULATORY_CARE_PROVIDER_SITE_OTHER): Admitting: Family

## 2023-12-28 ENCOUNTER — Encounter: Payer: Self-pay | Admitting: Family

## 2023-12-28 VITALS — BP 127/74 | HR 60 | Temp 97.3°F | Resp 18 | Ht 64.0 in | Wt 150.0 lb

## 2023-12-28 DIAGNOSIS — Z1231 Encounter for screening mammogram for malignant neoplasm of breast: Secondary | ICD-10-CM | POA: Diagnosis not present

## 2023-12-28 DIAGNOSIS — Z89612 Acquired absence of left leg above knee: Secondary | ICD-10-CM

## 2023-12-28 DIAGNOSIS — E785 Hyperlipidemia, unspecified: Secondary | ICD-10-CM | POA: Diagnosis not present

## 2023-12-28 DIAGNOSIS — I129 Hypertensive chronic kidney disease with stage 1 through stage 4 chronic kidney disease, or unspecified chronic kidney disease: Secondary | ICD-10-CM

## 2023-12-28 DIAGNOSIS — G8194 Hemiplegia, unspecified affecting left nondominant side: Secondary | ICD-10-CM | POA: Diagnosis not present

## 2023-12-28 DIAGNOSIS — R7303 Prediabetes: Secondary | ICD-10-CM

## 2023-12-28 DIAGNOSIS — N183 Chronic kidney disease, stage 3 unspecified: Secondary | ICD-10-CM

## 2023-12-28 DIAGNOSIS — Z1211 Encounter for screening for malignant neoplasm of colon: Secondary | ICD-10-CM | POA: Diagnosis not present

## 2023-12-28 DIAGNOSIS — I739 Peripheral vascular disease, unspecified: Secondary | ICD-10-CM | POA: Diagnosis not present

## 2023-12-31 NOTE — Progress Notes (Addendum)
 Provider: Christean Courts FNP-C   Wendy Townsend, Wendy Guadeloupe, NP  Patient Care Team: Wendy Townsend, Wendy Guadeloupe, NP as PCP - General (Family Medicine) Nahser, Lela Purple, MD as PCP - Cardiology (Cardiology)  Extended Emergency Contact Information Primary Emergency Contact: Melodi Sprung, [place] [address] [place] Phone: [phone] Relation: Niece  Code Status:  Full Code  Goals of care: Advanced Directive information    06/01/2023   10:21 AM  Advanced Directives  Does Patient Have a Medical Advance Directive? No     Chief Complaint  Patient presents with   Medical Management of Chronic Issues    6 month follow-up. Discuss need for mammogram, covid booster, AWV, td/tdap, and shingrix.      Discussed the use of AI scribe software for clinical note transcription with the patient, who gave verbal consent to proceed.  History of Present Illness   Wendy Townsend is a 73 year old female who presents for a six-month follow-up.  Over the past six months, she has gained weight from 142 pounds to 150 pounds. She eats a variety of foods and tries to avoid sweets, although her niece brings her food. She finds it difficult to avoid certain foods, which she associates with increased cholesterol. No leg swelling, pain, or tenderness is reported.  She has a history of left leg amputation above the knee and does not wear her prosthetic leg due to poor fit with her clothing. She used to wear a shrinker but stopped after receiving the prosthetic. states prosthetic and shrinker makes her thigh larger than the left unable to wear her pants. She manages transfers from her wheelchair to the bed independently by pivoting on one leg. She is interested in obtaining a power wheelchair to facilitate outdoor activities.She will require DME Power wheelchair to allow her to maintain current level of independence with ADL's which cannot be achieved with Scooter,Rolling walker or cane. Patient  suffers from above the knee amputee and unsteady gait which impairs her ability to perform daily activities like bathing,walking,dressing,grooming and toileting in the home.   Has mental and physical ability to operate power wheelchair and willing to use in her home.Family members also available to assist.  Her current medications include carvedilol  3.125 mg twice daily, amlodipine  10 mg daily, and atorvastatin . She has not seen a cardiologist recently. She uses Aquaphor for skin care and has ear drops for wax buildup. No recent falls, but she mentions an 'almost' fall.  She has not received her shingles or tetanus vaccines and is due for a COVID vaccine. She completed a Cologuard test about five years ago and prefers to repeat this method for colon cancer screening. She has not had a recent mammogram.  Her brother, who lived with her, passed away in 01-Jul-2023. She now receives help from her cousin and several nieces. She lives in a home with no steps, allowing easy access for her wheelchair. She smokes 'a little bit' and acknowledges needing to work on quitting. She drinks green tea daily to aid bowel movements and reports no issues with urination. She has not been to the dentist or eye doctor this year but has glasses and no changes in vision.   Past Medical History:  Diagnosis Date   Clotting disorder (HCC)    Hyperlipidemia    Hypertension    Peripheral vascular disease (HCC)    vein surgery, left leg   Stroke (HCC) 2002   ongoing  mild loss of feeling in left side, had to learn to write right-handed   Substance abuse (HCC)    Tobacco abuse    Past Surgical History:  Procedure Laterality Date   AMPUTATION Left 07/01/2019   Procedure: AMPUTATION ABOVE KNEE, left;  Surgeon: Mayo Speck, MD;  Location: [facility] OR;  Service: Vascular;  Laterality: Left;   AORTA - BILATERAL FEMORAL ARTERY BYPASS GRAFT N/A 07/14/2016   Procedure: AORTOBIFEMORAL BYPASS GRAFT;  Surgeon: Adine Hoof,  MD;  Location: [facility] OR;  Service: Vascular;  Laterality: N/A;   AORTA - BILATERAL FEMORAL ARTERY BYPASS GRAFT Left 10/03/2018   Procedure: THROMBECTOMY OF LEFT LEG, LEFT FEMORAL-POPLITEAL BYPASS GRAFT, RIGHT TO LEFT FEMORAL-FEMORAL BYPASS GRAFT;  Surgeon: Mayo Speck, MD;  Location: [facility] OR;  Service: Vascular;  Laterality: Left;   CARDIAC CATHETERIZATION N/A 07/11/2016   Procedure: Left Heart Cath and Coronary Angiography;  Surgeon: Sammy Crisp, MD;  Location: [facility] INVASIVE CV LAB;  Service: Cardiovascular;  Laterality: N/A;   FEMORAL-POPLITEAL BYPASS GRAFT Left 07/14/2016   Procedure: REDO BYPASS GRAFT FEMORAL-POPLITEAL ARTERY;  Surgeon: Adine Hoof, MD;  Location: [facility] OR;  Service: Vascular;  Laterality: Left;   LOWER EXTREMITY ANGIOGRAPHY N/A 06/26/2019   Procedure: LOWER EXTREMITY ANGIOGRAPHY;  Surgeon: Young Hensen, MD;  Location: [facility] INVASIVE CV LAB;  Service: Cardiovascular;  Laterality: N/A;   LOWER EXTREMITY INTERVENTION Left 06/27/2019   Procedure: LOWER EXTREMITY INTERVENTION;  Surgeon: Young Hensen, MD;  Location: [facility] INVASIVE CV LAB;  Service: Cardiovascular;  Laterality: Left;   PERIPHERAL VASCULAR CATHETERIZATION N/A 07/08/2016   Procedure: Abdominal Aortogram w/ Bilateral Lower Extremity Runoff;  Surgeon: Richrd Char, MD;  Location: [facility] INVASIVE CV LAB;  Service: Cardiovascular;  Laterality: N/A;   PERIPHERAL VASCULAR THROMBECTOMY Left 06/27/2019   Procedure: LYSIS RECHECK;  Surgeon: Young Hensen, MD;  Location: [facility] INVASIVE CV LAB;  Service: Cardiovascular;  Laterality: Left;   VEIN SURGERY Left     Allergies  Allergen Reactions   Penicillins Swelling    Has patient had a PCN reaction causing immediate rash, facial/tongue/throat swelling, SOB or lightheadedness with hypotension: YES Has patient had a PCN reaction causing severe rash involving mucus membranes or skin necrosis: NO Has patient had a PCN reaction that required hospitalization  NO Has patient had a PCN reaction occurring within the last 10 years: NO If all of the above answers are "NO", then may proceed with Cephalosporin use.    Allergies as of 12/28/2023       Reactions   Penicillins Swelling   Has patient had a PCN reaction causing immediate rash, facial/tongue/throat swelling, SOB or lightheadedness with hypotension: YES Has patient had a PCN reaction causing severe rash involving mucus membranes or skin necrosis: NO Has patient had a PCN reaction that required hospitalization NO Has patient had a PCN reaction occurring within the last 10 years: NO If all of the above answers are "NO", then may proceed with Cephalosporin use.        Medication List        Accurate as of December 28, 2023 11:59 PM. If you have any questions, ask your nurse or doctor.          amLODipine  10 MG tablet Commonly known as: NORVASC  TAKE 1 TABLET (10 MG TOTAL) BY MOUTH DAILY. DX I10   Aspirin  Low Dose 81 MG tablet Generic drug: aspirin  EC TAKE 1 TABLET BY MOUTH DAILY *SWALLOW WHOLE*   atorvastatin  80 MG tablet Commonly known  as: LIPITOR  TAKE 1 TABLET BY MOUTH EVERY DAY   calcium -vitamin D  500-5 MG-MCG tablet Commonly known as: OSCAL WITH D TAKE 1 TABLET BY MOUTH TWICE A DAY   carvedilol  3.125 MG tablet Commonly known as: COREG  TAKE 1 TABLET BY MOUTH TWICE A DAY WITH FOOD   mineral oil-hydrophilic petrolatum  ointment Apply topically 2 (two) times daily. Apply to lower extremities               Durable Medical Equipment  (From admission, onward)           Start     Ordered   12/28/23 0000  DME Wheelchair electric       Comments: Power wheelchair  Sig. Use daily for ambulation Weight : 150 lbs  Height : 5'4"  G 81.94 Z 89.612  Dx.   12/28/23 1140            Review of Systems  Constitutional:  Negative for appetite change, chills, fatigue, fever and unexpected weight change.  HENT:  Negative for congestion, dental problem, ear  discharge, ear pain, facial swelling, hearing loss, nosebleeds, postnasal drip, rhinorrhea, sinus pressure, sinus pain, sneezing, sore throat, tinnitus and trouble swallowing.   Eyes:  Negative for pain, discharge, redness, itching and visual disturbance.  Respiratory:  Negative for cough, chest tightness, shortness of breath and wheezing.   Cardiovascular:  Negative for chest pain, palpitations and leg swelling.  Gastrointestinal:  Negative for abdominal distention, abdominal pain, blood in stool, constipation, diarrhea, nausea and vomiting.  Endocrine: Negative for cold intolerance, heat intolerance, polydipsia, polyphagia and polyuria.  Genitourinary:  Negative for difficulty urinating, dysuria, flank pain, frequency and urgency.  Musculoskeletal:  Positive for gait problem. Negative for arthralgias, back pain, joint swelling, myalgias, neck pain and neck stiffness.  Skin:  Negative for color change, pallor, rash and wound.  Neurological:  Negative for dizziness, syncope, speech difficulty, weakness, light-headedness, numbness and headaches.  Hematological:  Does not bruise/bleed easily.  Psychiatric/Behavioral:  Negative for agitation, behavioral problems, confusion, hallucinations, self-injury, sleep disturbance and suicidal ideas. The patient is not nervous/anxious.     Immunization History  Administered Date(s) Administered   Fluad Quad(high Dose 65+) 09/03/2019, 09/30/2020, 10/06/2021, 11/03/2022   Fluad Trivalent(High Dose 65+) 06/01/2023   Influenza, High Dose Seasonal PF 11/02/2018   Influenza,inj,Quad PF,6+ Mos 09/30/2016, 07/19/2017   PFIZER(Purple Top)SARS-COV-2 Vaccination 04/02/2020, 04/23/2020   Pneumococcal Conjugate-13 03/13/2020   Pneumococcal Polysaccharide-23 04/04/2017   Pertinent  Health Maintenance Due  Topic Date Due   MAMMOGRAM  11/02/2019   Colonoscopy  09/19/2028 (Originally 12/20/1996)   INFLUENZA VACCINE  04/19/2024   DEXA SCAN  Completed      11/02/2021     9:05 AM 04/18/2022    9:35 AM 11/03/2022    9:09 AM 06/01/2023   10:21 AM 12/28/2023   11:01 AM  Fall Risk  Falls in the past year? 0 0 0 0 1  Was there an injury with Fall? 0 0 0  0  Fall Risk Category Calculator 0 0 0  1  Fall Risk Category (Retired) Low Low     (RETIRED) Patient Fall Risk Level Low fall risk Low fall risk     Patient at Risk for Falls Due to No Fall Risks No Fall Risks No Fall Risks  No Fall Risks  Fall risk Follow up Falls evaluation completed Falls evaluation completed Falls evaluation completed;Education provided;Falls prevention discussed  Falls evaluation completed   Functional Status Survey:    Vitals:  12/28/23 0801  BP: 127/74  Pulse: 60  Resp: 18  Temp: (!) 97.3 F (36.3 C)  SpO2: 95%  Weight: 150 lb (68 kg)  Height: 5\' 4"  (1.626 m)   Body mass index is 25.75 kg/m. Physical Exam VITALS: T- 97.3, P- 60, BP- 127/74, SaO2- 95% MEASUREMENTS: Height- 5'4", Weight- 150. GENERAL: Alert, cooperative, well developed, no acute distress. HEENT: Normocephalic, normal oropharynx, moist mucous membranes, right ear with cerumen buildup, nose normal, no sinus tenderness. NECK: Neck supple. CHEST: Clear to auscultation bilaterally, no wheezes, rhonchi, or crackles. CARDIOVASCULAR: Normal heart rate and rhythm, S1 and S2 normal without murmurs.Left AKA ABDOMEN: Soft, non-tender, non-distended, without organomegaly, normal bowel sounds. EXTREMITIES: No cyanosis or edema. MUSCULOSKELETAL: Upper extremities FROM 4/5,Hips non-tender, knees normal. Left AKA and Right leg 4/5 strength NEUROLOGICAL: Cranial nerves grossly intact, moves all extremities without gross motor or sensory deficit.   Labs reviewed: Recent Labs    05/30/23 1012 12/25/23 0955  NA 143 142  K 3.9 3.5  CL 105 108  CO2 25 25  GLUCOSE 90 94  BUN 18 14  CREATININE 1.19* 1.03*  CALCIUM  9.8 10.2   Recent Labs    05/30/23 1012 12/25/23 0955  AST 20 18  ALT 13 12  BILITOT 0.4 0.7  PROT  7.4 7.8   Recent Labs    05/30/23 1012 12/25/23 0955  WBC 5.0 5.6  NEUTROABS 2,050 2,677  HGB 13.9 14.7  HCT 40.7 41.9  MCV 94.4 93.7  PLT 205 179   Lab Results  Component Value Date   TSH 2.40 12/25/2023   Lab Results  Component Value Date   HGBA1C 5.9 (H) 12/25/2023   Lab Results  Component Value Date   CHOL 111 12/25/2023   HDL 44 (L) 12/25/2023   LDLCALC 50 12/25/2023   TRIG 91 12/25/2023   CHOLHDL 2.5 12/25/2023    Significant Diagnostic Results in last 30 days:  No results found.  Assessment/Plan  Weight Gain She has gained weight, increasing from 142 lbs to 150 lbs since the last visit. She reports difficulty avoiding certain foods that may contribute to weight gain. Emphasized the importance of dietary modifications to manage weight and prevent potential complications such as diabetes. - Advise dietary modifications to manage weight, including reducing intake of sweets, bread, white rice, potatoes, and pasta.  Prediabetes Her HbA1c remains at 5.9%, indicating prediabetes. Advised dietary changes to prevent progression to diabetes, emphasizing the reduction of sugar and starch intake due to limited physical activity. - Advise dietary modifications to reduce sugar and starch intake. - Recheck HbA1c in six months.  Hypertension Her blood pressure is well-controlled at 127/74 mmHg. She is currently on carvedilol  3.125 mg twice daily and amlodipine  10 mg daily. There is no recent swelling in the legs, indicating good management of blood pressure and fluid status. - Continue carvedilol  3.125 mg twice daily. - Continue amlodipine  10 mg daily.  Hyperlipidemia Her cholesterol levels are well-controlled with atorvastatin . Total cholesterol is 111 mg/dL, triglycerides are 91 mg/dL, and LDL is 50 mg/dL, all within target ranges. Emphasized the importance of maintaining these levels, especially given her history of stroke. - Continue atorvastatin  as  prescribed.  Above-Knee Amputation She has an above-knee amputation and is not currently using a prosthetic leg due to fitting issues. She is managing transfers independently and is interested in obtaining a power wheelchair to improve mobility and independence. Discussed the process of obtaining a power wheelchair, including insurance coverage and assessment for appropriate sizing.  She will require DME Power wheelchair to allow her to maintain current level of independence with ADL's which cannot be achieved with Scooter,Rolling walker or cane. Patient suffers from above the knee amputee and unsteady gait which impairs her ability to perform daily activities like bathing,walking,dressing,grooming and toileting in the home.   Has mental and physical ability to operate power wheelchair and willing to use in her home.Family members also available to assist. Upper extremities FROM 4/5, Left AKA and Right leg 4/5 strength - Order power wheelchair assessment and initiate insurance process for coverage.  General Health Maintenance She is due for several vaccinations and screenings. Discussed the importance of the shingles vaccine, tetanus booster, and COVID-19 vaccine. Explained that the shingles vaccine is recommended due to chickenpox, which can lead to shingles later in life. The tetanus booster is important for protection against tetanus, especially in case of injuries. She is also due for a mammogram and colon cancer screening via Cologuard. She has not had recent dental or eye exams. - Advise obtaining shingles vaccine at the pharmacy. - Advise obtaining tetanus booster at the pharmacy. - Advise obtaining COVID-19 vaccine at the pharmacy. - Order Cologuard for colon cancer screening. - Order mammogram. - Advise scheduling dental and eye exams.  Follow-up She is scheduled for a six-month follow-up to reassess medical conditions and review lab results. Emphasized the importance of dietary changes and  monitoring health status. - Schedule six-month follow-up appointment in October. - Advise completing lab work a few days before the follow-up appointment.   Family/ staff Communication: Reviewed plan of care with patient verbalized understanding   Labs/tests ordered:  - CBC with Differential/Platelet - CMP with eGFR([facility]) - TSH - Hgb A1C - Lipid panel -Cologuard  - Mammogram   Next Appointment : Return in about 6 months (around 06/28/2024) for medical mangement of chronic issues., Fasting labs in 6 months prior to visit.   Spent 30 minutes of Face to face and non-face to face with patient  >50% time spent counseling; reviewing medical record; tests; labs; documentation and developing future plan of care.   Estil Heman, NP

## 2024-02-05 ENCOUNTER — Telehealth

## 2024-02-05 NOTE — Telephone Encounter (Signed)
  Left a detail message for the patient to call [facility] in regards of Power Wheelchair that was order on 4/11.

## 2024-02-05 NOTE — Telephone Encounter (Signed)
 Patient called back stating that she received a call to give a call back about Power Wheelchair for [employer]. Provided patient with the information from the screenshot.

## 2024-02-14 ENCOUNTER — Telehealth: Payer: Self-pay | Admitting: Family

## 2024-02-14 ENCOUNTER — Telehealth: Payer: Self-pay

## 2024-02-14 NOTE — Telephone Encounter (Signed)
 Copied from CRM [id number]. Topic: Clinical - Order For Equipment >> Feb 14, 2024  1:45 PM Brittney F wrote: Reason for CRM:   Patient called in stating that she got in contact with [employer] for the office and was informed that they are still waiting on the PCP approval letter. Patient would like to be called back with an update on the power wheelchair as she is still in need of the requested equipment.  [facility]:   Phone: [phone] ext [phone]

## 2024-02-14 NOTE — Telephone Encounter (Signed)
 [employer] Powered mobility form received request documentation for UE strength.Please Print visit notes as send as requested.

## 2024-02-14 NOTE — Telephone Encounter (Signed)
 Spoke with [name] at [facility] and clarified what further information was needed in order to process the paperwork. Paperwork was given to [facility], Elijio Guadeloupe, NP, addendum was made and paperwork has been given to admin staff to process.  Spoke with patient and she is aware of status.

## 2024-02-14 NOTE — Telephone Encounter (Signed)
 Duplicate encounter

## 2024-03-06 ENCOUNTER — Telehealth

## 2024-03-06 NOTE — Telephone Encounter (Signed)
 Please send forms for provider to be filled and signed.

## 2024-03-06 NOTE — Telephone Encounter (Signed)
 Copied from CRM [id number]. Topic: General - Other >> Mar 06, 2024 10:34 AM Shelby Dessert H wrote: Reason for CRM: Nitalia called from [employer] states she received the chart notes but they didn't ask for what she was needed, she states that first thing she needs please clarify the documented distant the patient must travel to complete mradl in the home, the doctor can circle and input the amount of feet the patient can travel within the home and it needs to be more than 0 feet. Print the documents, circle it and the distance she needs to travel, sign and date it.   She needs scales of upper added to the document/usage, if they can use the upper bottom it needs to match example 3 or lower sign and date.   Must be completed by Christean Courts NP  Fax number is [phone] Attn: Jeana Michaels! Or you  can upload the documentation in the system.   Jeana Michaels can be reached at [phone] ext [phone].

## 2024-03-07 NOTE — Telephone Encounter (Signed)
 Called returned by [facility] and she emphasized that patient work can only be signed by [employer], Elijio Guadeloupe, NP. Paperwork will be faxed and placed in providers review and sign folder

## 2024-03-07 NOTE — Telephone Encounter (Signed)
 Outgoing call placed [name], 1st time call was disconnected. Second attempt, left a detailed message informing [name] of Dinah's response below

## 2024-03-10 ENCOUNTER — Other Ambulatory Visit: Payer: Self-pay | Admitting: Family

## 2024-03-10 DIAGNOSIS — N183 Chronic kidney disease, stage 3 unspecified: Secondary | ICD-10-CM

## 2024-04-02 NOTE — Telephone Encounter (Signed)
 Copied from CRM [id number]. Topic: Clinical - Order For Equipment >> Apr 02, 2024  2:58 PM Suzette B wrote: Reason for CRM: [name] augusto [employer] [phone] ext [phone], has called requesting an update on the status for the Powered Wheelchair for the patient. Ms. Nelva states she's been reaching out since Keri Veale and has not gotten a response to the orders that were sent over for the order of the powered wheelchair. She is awaiting response from NP Dinah. Ms. [name] states that she will be in the office for the remainder of the week from 9am to 6pm Est and lunch from 12:30 to 1:30pm if the provider happens to call and she is unable to pick up call please leave a message. She states if need be she will see about getting the patient another appt to be seen so that she can get her chair ordered. She doesn't want to have to tell the patient that she can't get the char. [name] states she has attempted several times and response has been unsuccessful

## 2024-04-02 NOTE — Telephone Encounter (Signed)
 Tried Mining engineer with [employer], transferred to Voicemail and LMOM to return call.

## 2024-04-03 NOTE — Telephone Encounter (Signed)
 Call returned, spoke with [name] and he took a message for [name] to call us  back.   When call is returned we need to inform [name] that [name] had to be out of office for an extended unexpected amount of time and will not return until Monday 04/08/24.   Per previous documentation [name] is the ONLY one that can sign paperwork and that is the cause for delay, however if they will allow another provider to sign, it can be signed and returned sooner

## 2024-05-30 DIAGNOSIS — S88022D Partial traumatic amputation at knee level, left lower leg, subsequent encounter: Secondary | ICD-10-CM | POA: Diagnosis not present

## 2024-06-24 ENCOUNTER — Other Ambulatory Visit

## 2024-06-24 DIAGNOSIS — R7303 Prediabetes: Secondary | ICD-10-CM

## 2024-06-24 DIAGNOSIS — N183 Chronic kidney disease, stage 3 unspecified: Secondary | ICD-10-CM

## 2024-06-24 DIAGNOSIS — I129 Hypertensive chronic kidney disease with stage 1 through stage 4 chronic kidney disease, or unspecified chronic kidney disease: Secondary | ICD-10-CM | POA: Diagnosis not present

## 2024-06-24 DIAGNOSIS — E785 Hyperlipidemia, unspecified: Secondary | ICD-10-CM | POA: Diagnosis not present

## 2024-06-25 ENCOUNTER — Other Ambulatory Visit: Payer: Self-pay | Admitting: Family

## 2024-06-25 DIAGNOSIS — Z8673 Personal history of transient ischemic attack (TIA), and cerebral infarction without residual deficits: Secondary | ICD-10-CM

## 2024-06-25 DIAGNOSIS — N183 Chronic kidney disease, stage 3 unspecified: Secondary | ICD-10-CM

## 2024-06-25 LAB — CBC WITH DIFFERENTIAL/PLATELET
Absolute Lymphocytes: 2906 {cells}/uL (ref 850–3900)
Absolute Monocytes: 574 {cells}/uL (ref 200–950)
Basophils Absolute: 41 {cells}/uL (ref 0–200)
Basophils Relative: 0.7 %
Eosinophils Absolute: 162 {cells}/uL (ref 15–500)
Eosinophils Relative: 2.8 %
HCT: 39.6 % (ref 35.0–45.0)
Hemoglobin: 13.4 g/dL (ref 11.7–15.5)
MCH: 32 pg (ref 27.0–33.0)
MCHC: 33.8 g/dL (ref 32.0–36.0)
MCV: 94.5 fL (ref 80.0–100.0)
MPV: 10 fL (ref 7.5–12.5)
Monocytes Relative: 9.9 %
Neutro Abs: 2117 {cells}/uL (ref 1500–7800)
Neutrophils Relative %: 36.5 %
Platelets: 180 Thousand/uL (ref 140–400)
RBC: 4.19 Million/uL (ref 3.80–5.10)
RDW: 13.2 % (ref 11.0–15.0)
Total Lymphocyte: 50.1 %
WBC: 5.8 Thousand/uL (ref 3.8–10.8)

## 2024-06-25 LAB — COMPREHENSIVE METABOLIC PANEL WITH GFR
AG Ratio: 1.6 (calc) (ref 1.0–2.5)
ALT: 10 U/L (ref 6–29)
AST: 17 U/L (ref 10–35)
Albumin: 4.4 g/dL (ref 3.6–5.1)
Alkaline phosphatase (APISO): 115 U/L (ref 37–153)
BUN/Creatinine Ratio: 19 (calc) (ref 6–22)
BUN: 22 mg/dL (ref 7–25)
CO2: 25 mmol/L (ref 20–32)
Calcium: 9.3 mg/dL (ref 8.6–10.4)
Chloride: 106 mmol/L (ref 98–110)
Creat: 1.15 mg/dL — ABNORMAL HIGH (ref 0.60–1.00)
Globulin: 2.8 g/dL (ref 1.9–3.7)
Glucose, Bld: 100 mg/dL — ABNORMAL HIGH (ref 65–99)
Potassium: 3.5 mmol/L (ref 3.5–5.3)
Sodium: 138 mmol/L (ref 135–146)
Total Bilirubin: 0.6 mg/dL (ref 0.2–1.2)
Total Protein: 7.2 g/dL (ref 6.1–8.1)
eGFR: 51 mL/min/1.73m2 — ABNORMAL LOW (ref >=60)

## 2024-06-25 LAB — HEMOGLOBIN A1C
Hgb A1c MFr Bld: 5.7 % — ABNORMAL HIGH (ref <5.7)
Mean Plasma Glucose: 117 mg/dL
eAG (mmol/L): 6.5 mmol/L

## 2024-06-25 LAB — LIPID PANEL
Cholesterol: 82 mg/dL (ref <200)
HDL: 44 mg/dL — ABNORMAL LOW (ref >=50)
LDL Cholesterol (Calc): 25 mg/dL
Non-HDL Cholesterol (Calc): 38 mg/dL (ref <130)
Total CHOL/HDL Ratio: 1.9 (calc) (ref <5.0)
Triglycerides: 57 mg/dL (ref <150)

## 2024-06-25 LAB — TSH: TSH: 2.24 m[IU]/L (ref 0.40–4.50)

## 2024-06-26 ENCOUNTER — Ambulatory Visit: Payer: Self-pay | Admitting: Family

## 2024-06-28 ENCOUNTER — Ambulatory Visit: Admitting: Family

## 2024-06-29 DIAGNOSIS — S88022D Partial traumatic amputation at knee level, left lower leg, subsequent encounter: Secondary | ICD-10-CM | POA: Diagnosis not present

## 2024-06-29 DIAGNOSIS — I69352 Hemiplegia and hemiparesis following cerebral infarction affecting left dominant side: Secondary | ICD-10-CM | POA: Diagnosis not present

## 2024-07-01 ENCOUNTER — Telehealth: Payer: Self-pay

## 2024-07-01 NOTE — Telephone Encounter (Signed)
 Please verify with Pharmacy which medication  are needed then send refill.

## 2024-07-01 NOTE — Telephone Encounter (Signed)
Message routed to PCP Ngetich, Dinah C, NP  

## 2024-07-01 NOTE — Telephone Encounter (Signed)
 Noted

## 2024-07-01 NOTE — Telephone Encounter (Signed)
 Copied from CRM [id number]. Topic: Clinical - Prescription Issue >> Jun 28, 2024  4:19 PM Graeme ORN wrote: Reason for CRM: Patient called. States pharmacy told her they need provider approval for Rx. They reached out and have not received response. Let her know 3 were sent on 10/7. Patient unsure of what medications are requested. Would like someone to reach out to pharmacy. Patient also said she was unaware of appt. No message was left for reminder. She rescheduled for next week.    ----------------------------------------------------------------------- From previous Reason for Contact - Medication Refill: Medication:   Has the patient contacted their pharmacy?   (Agent: If no, request that the patient contact the pharmacy for the refill. If patient does not wish to contact the pharmacy document the reason why and proceed with request.) (Agent: If yes, when and what did the pharmacy advise?)  This is the patient's preferred pharmacy:  [id number] [address] [place], [place] - [address] [place] [address] Phone: [phone] Fax: [phone]  [facility] [address] [place] [address] Phone: [phone] Fax: [phone]  Is this the correct pharmacy for this prescription?   If no, delete pharmacy and type the correct one.   Has the prescription been filled recently?    Is the patient out of the medication?    Has the patient been seen for an appointment in the last year OR does the patient have an upcoming appointment?    Can we respond through MyChart?    Agent: Please be advised that Rx refills may take up to 3 business days. We ask that you follow-up with your pharmacy.

## 2024-07-01 NOTE — Telephone Encounter (Signed)
 I called pharmacy and they stated that patient needed refills not approval. All 4 prescription are ready for pick up and patient called and notified.

## 2024-07-02 ENCOUNTER — Encounter: Payer: Self-pay | Admitting: Family

## 2024-07-02 ENCOUNTER — Ambulatory Visit: Admitting: Family

## 2024-07-02 VITALS — BP 132/78 | HR 60 | Temp 97.6°F | Resp 19 | Ht 64.0 in | Wt 132.2 lb

## 2024-07-02 DIAGNOSIS — E785 Hyperlipidemia, unspecified: Secondary | ICD-10-CM | POA: Diagnosis not present

## 2024-07-02 DIAGNOSIS — N183 Chronic kidney disease, stage 3 unspecified: Secondary | ICD-10-CM | POA: Diagnosis not present

## 2024-07-02 DIAGNOSIS — I129 Hypertensive chronic kidney disease with stage 1 through stage 4 chronic kidney disease, or unspecified chronic kidney disease: Secondary | ICD-10-CM | POA: Diagnosis not present

## 2024-07-02 DIAGNOSIS — R2681 Unsteadiness on feet: Secondary | ICD-10-CM | POA: Diagnosis not present

## 2024-07-02 DIAGNOSIS — I739 Peripheral vascular disease, unspecified: Secondary | ICD-10-CM | POA: Diagnosis not present

## 2024-07-02 DIAGNOSIS — H6121 Impacted cerumen, right ear: Secondary | ICD-10-CM | POA: Diagnosis not present

## 2024-07-02 DIAGNOSIS — Z23 Encounter for immunization: Secondary | ICD-10-CM | POA: Diagnosis not present

## 2024-07-02 DIAGNOSIS — Z89612 Acquired absence of left leg above knee: Secondary | ICD-10-CM | POA: Diagnosis not present

## 2024-07-02 DIAGNOSIS — R7303 Prediabetes: Secondary | ICD-10-CM | POA: Diagnosis not present

## 2024-07-02 NOTE — Patient Instructions (Signed)
 1.Stop at check out and schedule Annual Wellness Visit.

## 2024-07-10 DIAGNOSIS — R2681 Unsteadiness on feet: Secondary | ICD-10-CM | POA: Insufficient documentation

## 2024-07-10 NOTE — Progress Notes (Signed)
 Provider: [name] Plough FNP-C   Veora Fonte, [name] BROCKS, NP  Patient Care Team: Barret Esquivel, [name] BROCKS, NP as PCP - General (Family Medicine) Nahser, Aleene PARAS, MD (Inactive) as PCP - Cardiology (Cardiology)  Extended Emergency Contact Information Primary Emergency Contact: Rudy Roddy [place], [place] [address] [place] Phone: [phone] Relation: Niece  Code Status:  Full Code  Goals of care: Advanced Directive information    07/02/2024    3:01 PM  Advanced Directives  Does Patient Have a Medical Advance Directive? No  Would patient like information on creating a medical advance directive? No - Patient declined     Chief Complaint  Patient presents with   Medicare Wellness    6 Month follow up.    Discussed the use of AI scribe software for clinical note transcription with the patient, who gave verbal consent to proceed.  History of Present Illness   Wendy Townsend is a 73 year old female who presents for a six-month follow-up appointment.  She recently received a new power wheelchair, which she finds large and difficult to transport. She prefers a smaller, foldable option like a scooter. The current wheelchair allows her to navigate outdoors but is cumbersome to carry or fit into a car. The company did not measure her before providing the wheelchair.  Her glucose levels have increased, with a recent reading of 100, compared to 94 six months ago and 91 a year ago. She attributes this to increased candy consumption. Her HbA1c is currently 5.7, which is an improvement from 5.9 six months ago.  Her kidney function is stable, with a creatinine level of 1.15, slightly up from 1.03 six months ago but improved from 1.19 a year ago. No issues with her electrolytes, liver function, white blood cells, hemoglobin, thyroid  levels, or cholesterol, all of which are within normal limits.  She discusses her prosthetic for the left leg, stating it is too large and  makes it difficult to wear pants over it. She finds it noticeable and considers it 'useless' due to its size.  No wounds, spots, or scratches on her legs, including the stump of her amputated leg. No numbness or tingling in her right leg and no sores on the bottom of her feet.  She lives independently, occasionally assisted by her niece, and continues to cook for herself, though not large meals. She denies any issues with urination or bowel movements.  Past Medical History:  Diagnosis Date   Clotting disorder    Hyperlipidemia    Hypertension    Peripheral vascular disease    vein surgery, left leg   Stroke (HCC) 2002   ongoing mild loss of feeling in left side, had to learn to write right-handed   Substance abuse (HCC)    Tobacco abuse    Past Surgical History:  Procedure Laterality Date   AMPUTATION Left 07/01/2019   Procedure: AMPUTATION ABOVE KNEE, left;  Surgeon: Oris Krystal FALCON, MD;  Location: [facility] OR;  Service: Vascular;  Laterality: Left;   AORTA - BILATERAL FEMORAL ARTERY BYPASS GRAFT N/A 07/14/2016   Procedure: AORTOBIFEMORAL BYPASS GRAFT;  Surgeon: Penne Lonni Colorado, MD;  Location: [facility] OR;  Service: Vascular;  Laterality: N/A;   AORTA - BILATERAL FEMORAL ARTERY BYPASS GRAFT Left 10/03/2018   Procedure: THROMBECTOMY OF LEFT LEG, LEFT FEMORAL-POPLITEAL BYPASS GRAFT, RIGHT TO LEFT FEMORAL-FEMORAL BYPASS GRAFT;  Surgeon: Oris Krystal FALCON, MD;  Location: [facility] OR;  Service: Vascular;  Laterality: Left;  CARDIAC CATHETERIZATION N/A 07/11/2016   Procedure: Left Heart Cath and Coronary Angiography;  Surgeon: Lonni Hanson, MD;  Location: [facility] INVASIVE CV LAB;  Service: Cardiovascular;  Laterality: N/A;   FEMORAL-POPLITEAL BYPASS GRAFT Left 07/14/2016   Procedure: REDO BYPASS GRAFT FEMORAL-POPLITEAL ARTERY;  Surgeon: Penne Lonni [place], MD;  Location: [facility] OR;  Service: Vascular;  Laterality: Left;   LOWER EXTREMITY ANGIOGRAPHY N/A 06/26/2019   Procedure: LOWER EXTREMITY ANGIOGRAPHY;   Surgeon: Gretta Lonni PARAS, MD;  Location: [facility] INVASIVE CV LAB;  Service: Cardiovascular;  Laterality: N/A;   LOWER EXTREMITY INTERVENTION Left 06/27/2019   Procedure: LOWER EXTREMITY INTERVENTION;  Surgeon: Gretta Lonni PARAS, MD;  Location: [facility] INVASIVE CV LAB;  Service: Cardiovascular;  Laterality: Left;   PERIPHERAL VASCULAR CATHETERIZATION N/A 07/08/2016   Procedure: Abdominal Aortogram w/ Bilateral Lower Extremity Runoff;  Surgeon: Carlin FORBES Haddock, MD;  Location: [facility] INVASIVE CV LAB;  Service: Cardiovascular;  Laterality: N/A;   PERIPHERAL VASCULAR THROMBECTOMY Left 06/27/2019   Procedure: LYSIS RECHECK;  Surgeon: Gretta Lonni PARAS, MD;  Location: [facility] INVASIVE CV LAB;  Service: Cardiovascular;  Laterality: Left;   VEIN SURGERY Left     Allergies  Allergen Reactions   Penicillins Swelling    Has patient had a PCN reaction causing immediate rash, facial/tongue/throat swelling, SOB or lightheadedness with hypotension: YES Has patient had a PCN reaction causing severe rash involving mucus membranes or skin necrosis: NO Has patient had a PCN reaction that required hospitalization NO Has patient had a PCN reaction occurring within the last 10 years: NO If all of the above answers are NO, then may proceed with Cephalosporin use.    Allergies as of 07/02/2024       Reactions   Penicillins Swelling   Has patient had a PCN reaction causing immediate rash, facial/tongue/throat swelling, SOB or lightheadedness with hypotension: YES Has patient had a PCN reaction causing severe rash involving mucus membranes or skin necrosis: NO Has patient had a PCN reaction that required hospitalization NO Has patient had a PCN reaction occurring within the last 10 years: NO If all of the above answers are NO, then may proceed with Cephalosporin use.        Medication List        Accurate as of July 02, 2024 11:59 PM. If you have any questions, ask your nurse or doctor.           amLODipine  10 MG tablet Commonly known as: NORVASC  TAKE 1 TABLET (10 MG TOTAL) BY MOUTH DAILY. DX I10   Aspirin  Low Dose 81 MG tablet Generic drug: aspirin  EC TAKE 1 TABLET BY MOUTH DAILY *SWALLOW WHOLE*   atorvastatin  80 MG tablet Commonly known as: LIPITOR  TAKE 1 TABLET BY MOUTH EVERY DAY   calcium -vitamin D  500-5 MG-MCG tablet Commonly known as: OSCAL WITH D TAKE 1 TABLET BY MOUTH TWICE A DAY   carvedilol  3.125 MG tablet Commonly known as: COREG  TAKE 1 TABLET BY MOUTH TWICE A DAY WITH FOOD   mineral oil-hydrophilic petrolatum  ointment Apply topically 2 (two) times daily. Apply to lower extremities        Review of Systems  Constitutional:  Negative for appetite change, chills, fatigue, fever and unexpected weight change.  HENT:  Negative for congestion, ear discharge, ear pain, hearing loss, nosebleeds, postnasal drip, rhinorrhea, sinus pressure, sinus pain, sneezing, sore throat, tinnitus and trouble swallowing.   Eyes:  Negative for pain, discharge, redness, itching and visual disturbance.  Respiratory:  Negative for cough, chest tightness, shortness of  breath and wheezing.   Cardiovascular:  Negative for chest pain and palpitations.       Left BKA   Gastrointestinal:  Negative for abdominal distention, abdominal pain, blood in stool, constipation, diarrhea, nausea and vomiting.  Endocrine: Negative for cold intolerance, heat intolerance, polydipsia, polyphagia and polyuria.  Genitourinary:  Negative for difficulty urinating, dysuria, flank pain, frequency and urgency.  Musculoskeletal:  Positive for gait problem. Negative for arthralgias, back pain, joint swelling, myalgias, neck pain and neck stiffness.  Skin:  Negative for color change, pallor, rash and wound.  Neurological:  Negative for dizziness, syncope, speech difficulty, weakness, light-headedness, numbness and headaches.  Hematological:  Does not bruise/bleed easily.  Psychiatric/Behavioral:  Negative for  agitation, behavioral problems, confusion, hallucinations, self-injury, sleep disturbance and suicidal ideas. The patient is not nervous/anxious.     Immunization History  Administered Date(s) Administered   Fluad Quad(high Dose 65+) 09/03/2019, 09/30/2020, 10/06/2021, 11/03/2022   Fluad Trivalent(High Dose 65+) 06/01/2023   INFLUENZA, HIGH DOSE SEASONAL PF 11/02/2018, 07/02/2024   Influenza,inj,Quad PF,6+ Mos 09/30/2016, 07/19/2017   PFIZER(Purple Top)SARS-COV-2 Vaccination 04/02/2020, 04/23/2020   Pneumococcal Conjugate-13 03/13/2020   Pneumococcal Polysaccharide-23 04/04/2017   Pertinent  Health Maintenance Due  Topic Date Due   Mammogram  11/02/2019   Colonoscopy  09/19/2028 (Originally 12/20/1996)   Influenza Vaccine  Completed   DEXA SCAN  Completed      04/18/2022    9:35 AM 11/03/2022    9:09 AM 06/01/2023   10:21 AM 12/28/2023   11:01 AM 07/02/2024    3:00 PM  Fall Risk  Falls in the past year? 0 0 0 1 0  Was there an injury with Fall? 0 0  0 0  Fall Risk Category Calculator 0 0  1 0  Fall Risk Category (Retired) Low       (RETIRED) Patient Fall Risk Level Low fall risk       Patient at Risk for Falls Due to No Fall Risks No Fall Risks  No Fall Risks No Fall Risks  Fall risk Follow up Falls evaluation completed  Falls evaluation completed;Education provided;Falls prevention discussed  Falls evaluation completed Falls evaluation completed     Data saved with a previous flowsheet row definition   Functional Status Survey:    Vitals:   07/02/24 1503  BP: 132/78  Pulse: 60  Resp: 19  Temp: 97.6 F (36.4 C)  SpO2: 95%  Weight: 132 lb 3.2 oz (60 kg)  Height: 5' 4 (1.626 m)   Body mass index is 22.69 kg/m. Physical Exam  GENERAL: Alert, cooperative, well developed, no acute distress. HEENT: Normocephalic, normal oropharynx, moist mucous membranes. Left ear normal, right ear with cerumen buildup. Nose normal. No sinus tenderness. NECK: Thyroid  normal. CHEST:  Clear to auscultation bilaterally. No wheezes, rhonchi, or crackles. CARDIOVASCULAR: Normal heart rate and rhythm, S1 and S2 normal without murmurs.left BKA  ABDOMEN: Soft, non-tender, non-distended, without organomegaly. Normal bowel sounds. EXTREMITIES: No cyanosis or edema. No swelling in extremities. No numbness or tingling in right leg. No sores on Left BKA  NEUROLOGICAL: Cranial nerves grossly intact, moves all extremities without gross motor or sensory deficit.  SKIN: No rash,no lesion or erythema   PSYCHIATRY/BEHAVIORAL: Mood stable   Labs reviewed: Recent Labs    12/25/23 0955 06/24/24 0948  NA 142 138  K 3.5 3.5  CL 108 106  CO2 25 25  GLUCOSE 94 100*  BUN 14 22  CREATININE 1.03* 1.15*  CALCIUM  10.2 9.3   Recent Labs  12/25/23 0955 06/24/24 0948  AST 18 17  ALT 12 10  BILITOT 0.7 0.6  PROT 7.8 7.2   Recent Labs    12/25/23 0955 06/24/24 0948  WBC 5.6 5.8  NEUTROABS 2,677 2,117  HGB 14.7 13.4  HCT 41.9 39.6  MCV 93.7 94.5  PLT 179 180   Lab Results  Component Value Date   TSH 2.24 06/24/2024   Lab Results  Component Value Date   HGBA1C 5.7 (H) 06/24/2024   Lab Results  Component Value Date   CHOL 82 06/24/2024   HDL 44 (L) 06/24/2024   LDLCALC 25 06/24/2024   TRIG 57 06/24/2024   CHOLHDL 1.9 06/24/2024    Significant Diagnostic Results in last 30 days:  No results found.  Assessment/Plan  Prediabetes Glucose level increased to 100 from previous levels of 94 and 91. HbA1c improved to 5.7 from 5.9 six months ago, indicating prediabetes. Increase in glucose attributed to dietary habits, particularly candy consumption. - Advise reducing candy intake to lower glucose levels - Encourage consumption of sugar-free alternatives or fruits as substitutes for candy  Chronic kidney disease, stage 3  Creatinine level stable at 1.15 with previous values of 1.03 and 1.19. Electrolytes and liver function are normal. - Order lab work in six months to  monitor kidney function  Peripheral artery disease of left lower extremity No wounds or spots on the left leg BKA,  Amputation of left lower extremity Prosthetic for the left leg is too large and considered unusable. - she will call DME company   Mobility issues with power wheelchair Power wheelchair is too large and cumbersome for daily use. - Contact the supplier to discuss switching to a smaller, more portable option like a scooter - Advise contacting early to facilitate a switch before Medicare restrictions apply  Impacted cerumen, right ear Buildup of wax in the right ear. - Instruct to use ear drops in the right ear for four days - Schedule an appointment to flush out the ear wax after four days of using ear drops   Family/ staff Communication: Reviewed plan of care with patient verbalized understanding   Labs/tests ordered: - CBC with Differential/Platelet - CMP with eGFR([facility]) - TSH - Hgb A1C - Lipid panel  Next Appointment : Return in about 6 months (around 12/31/2024) for medical mangement of chronic issues., Fasting labs in 6 months prior to visit.   Spent 30 minutes of Face to face and non-face to face with patient  >50% time spent counseling; reviewing medical record; tests; labs; documentation and developing future plan of care.   [name] JAYSON Plough, NP

## 2024-07-25 NOTE — Progress Notes (Signed)
 Wendy Townsend                                          MRN: [id number]   07/25/2024   The [facility] Quality Team Specialist reviewed this patient medical record for the purposes of chart review for care gap closure. The following were reviewed: chart review for care gap closure-colorectal cancer screening.    [facility] Quality Team

## 2024-09-17 NOTE — Progress Notes (Signed)
 Wendy Townsend                                          MRN: [id number]   09/17/2024   The [facility] Quality Team Specialist reviewed this patient medical record for the purposes of chart review for care gap closure. The following were reviewed: chart review for care gap closure-breast cancer screening.    [facility] Quality Team

## 2024-09-22 ENCOUNTER — Other Ambulatory Visit: Payer: Self-pay | Admitting: Family

## 2024-09-22 DIAGNOSIS — N183 Chronic kidney disease, stage 3 unspecified: Secondary | ICD-10-CM

## 2024-10-02 ENCOUNTER — Other Ambulatory Visit: Payer: Self-pay | Admitting: Family

## 2024-12-30 ENCOUNTER — Other Ambulatory Visit: Payer: Self-pay

## 2025-01-03 ENCOUNTER — Ambulatory Visit: Payer: Self-pay | Admitting: Family
